# Patient Record
Sex: Male | Born: 1935
Health system: Southern US, Community
[De-identification: ages and names within clinical notes are randomized; demographics above are authoritative.]

## PROBLEM LIST (undated history)

## (undated) ENCOUNTER — Ambulatory Visit: Admission: RE | Payer: Medicare Other | Source: Ambulatory Visit | Admitting: Ophthalmology

## (undated) DIAGNOSIS — G250 Essential tremor: Secondary | ICD-10-CM

## (undated) DIAGNOSIS — F0280 Dementia in other diseases classified elsewhere without behavioral disturbance: Secondary | ICD-10-CM

## (undated) DIAGNOSIS — Z96659 Presence of unspecified artificial knee joint: Secondary | ICD-10-CM

## (undated) DIAGNOSIS — R350 Frequency of micturition: Secondary | ICD-10-CM

## (undated) DIAGNOSIS — C801 Malignant (primary) neoplasm, unspecified: Secondary | ICD-10-CM

## (undated) DIAGNOSIS — I739 Peripheral vascular disease, unspecified: Secondary | ICD-10-CM

## (undated) DIAGNOSIS — K219 Gastro-esophageal reflux disease without esophagitis: Secondary | ICD-10-CM

## (undated) DIAGNOSIS — E785 Hyperlipidemia, unspecified: Secondary | ICD-10-CM

## (undated) DIAGNOSIS — I441 Atrioventricular block, second degree: Secondary | ICD-10-CM

## (undated) DIAGNOSIS — F0150 Vascular dementia without behavioral disturbance: Secondary | ICD-10-CM

## (undated) DIAGNOSIS — N400 Enlarged prostate without lower urinary tract symptoms: Secondary | ICD-10-CM

## (undated) DIAGNOSIS — N529 Male erectile dysfunction, unspecified: Secondary | ICD-10-CM

## (undated) DIAGNOSIS — Z8551 Personal history of malignant neoplasm of bladder: Secondary | ICD-10-CM

## (undated) DIAGNOSIS — G252 Other specified forms of tremor: Principal | ICD-10-CM

## (undated) DIAGNOSIS — M810 Age-related osteoporosis without current pathological fracture: Secondary | ICD-10-CM

## (undated) DIAGNOSIS — I2510 Atherosclerotic heart disease of native coronary artery without angina pectoris: Secondary | ICD-10-CM

## (undated) DIAGNOSIS — I4510 Unspecified right bundle-branch block: Secondary | ICD-10-CM

## (undated) DIAGNOSIS — I10 Essential (primary) hypertension: Secondary | ICD-10-CM

## (undated) DIAGNOSIS — I350 Nonrheumatic aortic (valve) stenosis: Secondary | ICD-10-CM

## (undated) DIAGNOSIS — Z951 Presence of aortocoronary bypass graft: Secondary | ICD-10-CM

## (undated) DIAGNOSIS — I5042 Chronic combined systolic (congestive) and diastolic (congestive) heart failure: Secondary | ICD-10-CM

## (undated) DIAGNOSIS — Z955 Presence of coronary angioplasty implant and graft: Secondary | ICD-10-CM

## (undated) DIAGNOSIS — R32 Unspecified urinary incontinence: Secondary | ICD-10-CM

## (undated) DIAGNOSIS — M545 Low back pain, unspecified: Secondary | ICD-10-CM

## (undated) DIAGNOSIS — I8290 Acute embolism and thrombosis of unspecified vein: Secondary | ICD-10-CM

## (undated) DIAGNOSIS — G3184 Mild cognitive impairment, so stated: Secondary | ICD-10-CM

## (undated) DIAGNOSIS — H9190 Unspecified hearing loss, unspecified ear: Secondary | ICD-10-CM

## (undated) DIAGNOSIS — N429 Disorder of prostate, unspecified: Secondary | ICD-10-CM

## (undated) DIAGNOSIS — I517 Cardiomegaly: Secondary | ICD-10-CM

## (undated) DIAGNOSIS — H269 Unspecified cataract: Secondary | ICD-10-CM

## (undated) HISTORY — DX: Essential tremor: G25.0

## (undated) HISTORY — DX: Personal history of malignant neoplasm of bladder: Z85.51

## (undated) HISTORY — DX: Age-related osteoporosis without current pathological fracture: M81.0

## (undated) HISTORY — DX: Atrioventricular block, second degree: I44.1

## (undated) HISTORY — DX: Benign prostatic hyperplasia without lower urinary tract symptoms: N40.0

## (undated) HISTORY — DX: Male erectile dysfunction, unspecified: N52.9

## (undated) HISTORY — DX: Peripheral vascular disease, unspecified: I73.9

## (undated) HISTORY — DX: Nonrheumatic aortic (valve) stenosis: I35.0

## (undated) HISTORY — DX: Other specified forms of tremor: G25.2

## (undated) HISTORY — DX: Essential (primary) hypertension: I10

## (undated) HISTORY — DX: Vascular dementia without behavioral disturbance: F01.50

## (undated) HISTORY — DX: Hyperlipidemia, unspecified: E78.5

## (undated) HISTORY — DX: Presence of unspecified artificial knee joint: Z96.659

## (undated) HISTORY — DX: Atherosclerotic heart disease of native coronary artery without angina pectoris: I25.10

## (undated) HISTORY — DX: Gastro-esophageal reflux disease without esophagitis: K21.9

## (undated) HISTORY — DX: Unspecified right bundle-branch block: I45.10

## (undated) HISTORY — DX: Presence of aortocoronary bypass graft: Z95.1

## (undated) HISTORY — DX: Cardiomegaly: I51.7

## (undated) HISTORY — DX: Unspecified hearing loss, unspecified ear: H91.90

## (undated) HISTORY — DX: Presence of coronary angioplasty implant and graft: Z95.5

## (undated) HISTORY — DX: Acute embolism and thrombosis of unspecified vein: I82.90

## (undated) HISTORY — DX: Low back pain, unspecified: M54.50

## (undated) HISTORY — DX: Unspecified urinary incontinence: R32

## (undated) HISTORY — DX: Disorder of prostate, unspecified: N42.9

## (undated) HISTORY — DX: Mild cognitive impairment of uncertain or unknown etiology: G31.84

## (undated) HISTORY — DX: Chronic combined systolic (congestive) and diastolic (congestive) heart failure: I50.42

## (undated) HISTORY — PX: CARDIAC VALVE REPLACEMENT: SHX585

## (undated) HISTORY — DX: Unspecified cataract: H26.9

## (undated) HISTORY — PX: COLONOSCOPY, DIAGNOSTIC (SCREENING): SHX174

## (undated) NOTE — Progress Notes (Signed)
Formatting of this note is different from the original.  Images from the original note were not included.  Patient Name: Rick [name]  Patient DOB: 1924-06-21  Patient MRN: [id number]  Date of Service: 06/15/2022  Provider Creating Note: Daune Perch, DO     Primary Care Provider: No primary care provider on file.   Referring Provider: No primary care provider on file.     Assessment & Plan   CKD stage IIIa, fairly stable renal function dating back to 2020, crt usually 1.3-1.5 mg/dL at baseline   Heart failure with reduced ejection fraction, LVEF 25-30% in February 2023, well compensated on current medications   CAD status post CABG in 2003, under care of cardiology,   Pulmonary embolism in February 2023, managed by heme, on Eliquis   Aortic stenosis status post TAVR 2021, under care of cardiology   BPH, with nocturia, on alpha blocker   Hyperlipidemia, on statin therapy, under care of cardiology   Dementia, progressive, on medical therapy   Monoclonal gammopathy (IgG lambda), under care of hematology, with plan to monitor/conservative management   Thrombocytopenia, chronic, worsening, under care of heme     Discussion:  [name]   has moderate renal disease in the setting of advanced age, possible cardiorenal syndrome, and hemodynamic impact from use of dual RAAS blockers.   Renal function has been fairly stable dating back to 2020 and no proteinuria was noted on urinalysis today.He appears euvolemic with good BP control, and no medication changes are warranted today.     We will plan for 44-month follow-up.  At this time, overall renal prognosis seems quite good.    Orders Placed This Encounter    CBC    Renal function panel    PTH, intact    Urine Protein / creatinine ratio    Magnesium     Return in about 6 months (around 12/15/2022).    Referring Doctor:  Patient is referred by: No primary care provider on file.     History of Present Illness  I saw Rick [name] in the office today for the first time in  consultation. Thank you for the opportunity to participate in his care.     Rick [name] is a 46 y.o. male who is referred for evaluation of abnormal kidney function. The pt's son, Hutch Rhett II, is also present and helps provide history.     Serum creatinine has been mostly in the 1.3-1.4 mg/dL range from November 2020 to January 2024.  Mild elevation from baseline was noted earlier this month on April 1 where serum creatinine was 1.7 mg/dL, which then improved back to baseline on April 17. Office UA today shows no protein.     Past medical history includes  CAD status post CABG in 2003 , heart failure with reduced ejection fraction with EF of 25-30% in Feb 2023, PE in February 2023, aortic stenosis status post TAVR in 2021, HLD,  dementia, BPH, monoclonal gammopathy (IgG lambda), thrombocytopenia.    Medications include Lasix 20 mg daily, Jardiance 10 mg daily Entresto 12-13mg  BID, and Spironolactone 12.5mg  daily.     He does not use NSAIDS.     There is ongoing nocturia, 3-4x nightyl. Home Bps are in the 115-140/50-60.     Office UA: Urinalysis performed in the office on 06/15/2022 shows sg 1.020, ph 6.0, 1+ glucose, no protein,blood, pyuria. Urine sediment bland.     The following portions of the patient's chart were reviewed in this  encounter and updated as appropriate, and all recent labs were also reviewed:   Tobacco  Allergies  Med Hx  Surg Hx  Fam Hx        Review of Systems    Unable to perform ROS: Dementia     Past Medical/Surgical History  Past Medical History:   Diagnosis Date    Chronic kidney disease     Congestive heart failure (HCC)     Coronary atherosclerosis of unspecified type of vessel, native or graft     Monoclonal gammopathy     Other and unspecified hyperlipidemia     Pulmonary embolism (HCC)      Past Surgical History:   Procedure Laterality Date    AORTIC VALVE SURGERY      2021    CORONARY ARTERY BYPASS GRAFT      2003    INGUINAL HERNIA REPAIR      KNEE ARTHROPLASTY  Bilateral      Family History  family history is not on file.    Social History  Social History     Tobacco Use    Smoking status: Never    Smokeless tobacco: Never   Vaping Use    Vaping Use: Never used   Substance Use Topics    Alcohol use: Not Currently    Drug use: Never     Allergies:  No Known Allergies    Current Medications:    Current Outpatient Medications:     donepezil (ARICEPT) 5 MG tablet, Take 5 mg by mouth every night, Disp: , Rfl:     Empagliflozin 10 MG tablet, Take 10 mg by mouth in the morning., Disp: , Rfl:     memantine (NAMENDA) 10 MG tablet, Take 20 mg by mouth 1 (one) time each day, Disp: , Rfl:     rosuvastatin (CRESTOR) 10 MG tablet, Take 10 mg by mouth 1 (one) time each day, Disp: , Rfl:     sacubitril-valsartan (Entresto) 24-26 MG per tablet, Take 0.5 tablets by mouth in the morning and 0.5 tablets in the evening., Disp: , Rfl:     spironolactone (ALDACTONE) 25 MG tablet, Take 12.5 mg by mouth 1 (one) time each day, Disp: , Rfl:     tamsulosin (FLOMAX) 0.4 MG 24 hr capsule, Take 0.4 mg by mouth 1 (one) time each day, Disp: , Rfl:     apixaban (Eliquis) 2.5 MG tablet, Take 2.5 mg by mouth in the morning and 2.5 mg in the evening., Disp: , Rfl:     Docusate Sodium (DSS) 100 MG capsule, Take 100 mg by mouth in the morning and 100 mg in the evening., Disp: , Rfl:     furosemide (LASIX) 20 MG tablet, Take 20 mg by mouth 1 (one) time each day, Disp: , Rfl:     Vitamin D, Cholecalciferol, 25 MCG (1000 UT) tablet, Take 2,000 Units by mouth 3 times weekly: Mon Wed, and Fri in the evening., Disp: , Rfl:      Physical Exam  BP (!) 107/46   Pulse (!) 47   Temp 97.3 F   Ht 5\' 8"  (1.727 m)   Wt 159 lb (72.1 kg)   SpO2 96%   BMI 24.18 kg/m   BP Readings from Last 4 Encounters:   06/15/22 (!) 107/46     Wt Readings from Last 4 Encounters:   06/15/22 159 lb (72.1 kg)     Body mass index is 24.18 kg/m.    Constitutional: He does  not appear ill. No distress.   HEENT:   Mouth/Throat: Oropharynx is  clear and moist.   Eyes: Conjunctivae and EOM are normal. Pupils are equal, round, and reactive to light.   Neck:   Supple    Cardiovascular:  Normal rate and regular rhythm.      Exam reveals no gallop and no friction rub.       No murmur heard.He exhibits no edema.   Pulmonary/Chest: Effort normal and breath sounds normal. No respiratory distress. He has no wheezes. He has no rales.   Abdominal: Soft. Bowel sounds are normal. He exhibits no distension. There is no abdominal tenderness.   Musculoskeletal:      Comments: Rom testing deferred, pt in wheelchair    Neurological: He is alert.   Pt with dementia, minimally conversive    Skin: Skin is dry.   Psychiatric: He has a normal mood and affect. Judgment normal.     Imaging:   Dry CT abd March 2023 with no hydronephrosis     Thank you again for this consult. Please do not hesitate to call my office or message with any questions.    Daune Perch, DO  06/15/2022     *This note was generated by the Epic EMR system/ Dragon speech recognition and may contain inherent errors or omissions not intended by the user. Grammatical errors, random word insertions, deletions, pronoun errors and incomplete sentences are occasional consequences of this technology due to software limitations. Not all errors are caught or corrected. If there are questions or concerns about the content of this note or information contained within the body of this dictation they should be addressed directly with the author for clarification.*   Electronically signed by Daune Perch, DO at 06/17/2022 12:11 PM EDT

## (undated) NOTE — Progress Notes (Signed)
Formatting of this note is different from the original.  Images from the original note were not included.  Patient Name: Rick [name]  Patient DOB: 09/18/1924  Patient MRN: [id number]  Date of Service: 06/15/2022  Provider Creating Note: Mitesh Shah V, DO     Primary Care Provider: No primary care provider on file.   Referring Provider: No primary care provider on file.     Assessment & Plan   CKD stage IIIa, fairly stable renal function dating back to 2020, crt usually 1.3-1.5 mg/dL at baseline   Heart failure with reduced ejection fraction, LVEF 25-30% in February 2023, well compensated on current medications   CAD status post CABG in 2003, under care of cardiology,   Pulmonary embolism in February 2023, managed by heme, on Eliquis   Aortic stenosis status post TAVR 2021, under care of cardiology   BPH, with nocturia, on alpha blocker   Hyperlipidemia, on statin therapy, under care of cardiology   Dementia, progressive, on medical therapy   Monoclonal gammopathy (IgG lambda), under care of hematology, with plan to monitor/conservative management   Thrombocytopenia, chronic, worsening, under care of heme     Discussion:  Rick [name]   has moderate renal disease in the setting of advanced age, possible cardiorenal syndrome, and hemodynamic impact from use of dual RAAS blockers.   Renal function has been fairly stable dating back to 2020 and no proteinuria was noted on urinalysis today.He appears euvolemic with good BP control, and no medication changes are warranted today.     We will plan for 6-month follow-up.  At this time, overall renal prognosis seems quite good.    Orders Placed This Encounter    CBC    Renal function panel    PTH, intact    Urine Protein / creatinine ratio    Magnesium     Return in about 6 months (around 12/15/2022).    Referring Doctor:  Patient is referred by: No primary care provider on file.     History of Present Illness  I saw Rick [name] in the office today for the first time in  consultation. Thank you for the opportunity to participate in his care.     Rick [name] is a 89 y.o. male who is referred for evaluation of abnormal kidney function. The pt's son, Rick [name], is also present and helps provide history.     Serum creatinine has been mostly in the 1.3-1.4 mg/dL range from November 2020 to January 2024.  Mild elevation from baseline was noted earlier this month on April 1 where serum creatinine was 1.7 mg/dL, which then improved back to baseline on April 17. Office UA today shows no protein.     Past medical history includes  CAD status post CABG in 2003 , heart failure with reduced ejection fraction with EF of 25-30% in Feb 2023, PE in February 2023, aortic stenosis status post TAVR in 2021, HLD,  dementia, BPH, monoclonal gammopathy (IgG lambda), thrombocytopenia.    Medications include Lasix 20 mg daily, Jardiance 10 mg daily Entresto 12-13mg BID, and Spironolactone 12.5mg daily.     He does not use NSAIDS.     There is ongoing nocturia, 3-4x nightyl. Home Bps are in the 115-140/50-60.     Office UA: Urinalysis performed in the office on 06/15/2022 shows sg 1.020, ph 6.0, 1+ glucose, no protein,blood, pyuria. Urine sediment bland.     The following portions of the patient's chart were reviewed in this   encounter and updated as appropriate, and all recent labs were also reviewed:   Tobacco  Allergies  Med Hx  Surg Hx  Fam Hx        Review of Systems    Unable to perform ROS: Dementia     Past Medical/Surgical History  Past Medical History:   Diagnosis Date    Chronic kidney disease     Congestive heart failure (HCC)     Coronary atherosclerosis of unspecified type of vessel, native or graft     Monoclonal gammopathy     Other and unspecified hyperlipidemia     Pulmonary embolism (HCC)      Past Surgical History:   Procedure Laterality Date    AORTIC VALVE SURGERY      2021    CORONARY ARTERY BYPASS GRAFT      2003    INGUINAL HERNIA REPAIR      KNEE ARTHROPLASTY  Bilateral      Family History  family history is not on file.    Social History  Social History     Tobacco Use    Smoking status: Never    Smokeless tobacco: Never   Vaping Use    Vaping Use: Never used   Substance Use Topics    Alcohol use: Not Currently    Drug use: Never     Allergies:  No Known Allergies    Current Medications:    Current Outpatient Medications:     donepezil (ARICEPT) 5 MG tablet, Take 5 mg by mouth every night, Disp: , Rfl:     Empagliflozin 10 MG tablet, Take 10 mg by mouth in the morning., Disp: , Rfl:     memantine (NAMENDA) 10 MG tablet, Take 20 mg by mouth 1 (one) time each day, Disp: , Rfl:     rosuvastatin (CRESTOR) 10 MG tablet, Take 10 mg by mouth 1 (one) time each day, Disp: , Rfl:     sacubitril-valsartan (Entresto) 24-26 MG per tablet, Take 0.5 tablets by mouth in the morning and 0.5 tablets in the evening., Disp: , Rfl:     spironolactone (ALDACTONE) 25 MG tablet, Take 12.5 mg by mouth 1 (one) time each day, Disp: , Rfl:     tamsulosin (FLOMAX) 0.4 MG 24 hr capsule, Take 0.4 mg by mouth 1 (one) time each day, Disp: , Rfl:     apixaban (Eliquis) 2.5 MG tablet, Take 2.5 mg by mouth in the morning and 2.5 mg in the evening., Disp: , Rfl:     Docusate Sodium (DSS) 100 MG capsule, Take 100 mg by mouth in the morning and 100 mg in the evening., Disp: , Rfl:     furosemide (LASIX) 20 MG tablet, Take 20 mg by mouth 1 (one) time each day, Disp: , Rfl:     Vitamin D, Cholecalciferol, 25 MCG (1000 UT) tablet, Take 2,000 Units by mouth 3 times weekly: Mon Wed, and Fri in the evening., Disp: , Rfl:      Physical Exam  BP (!) 107/46   Pulse (!) 47   Temp 97.3 F   Ht 5' 8" (1.727 m)   Wt 159 lb (72.1 kg)   SpO2 96%   BMI 24.18 kg/m   BP Readings from Last 4 Encounters:   06/15/22 (!) 107/46     Wt Readings from Last 4 Encounters:   06/15/22 159 lb (72.1 kg)     Body mass index is 24.18 kg/m.    Constitutional: He does   not appear ill. No distress.   HEENT:   Mouth/Throat: Oropharynx is  clear and moist.   Eyes: Conjunctivae and EOM are normal. Pupils are equal, round, and reactive to light.   Neck:   Supple    Cardiovascular:  Normal rate and regular rhythm.      Exam reveals no gallop and no friction rub.       No murmur heard.He exhibits no edema.   Pulmonary/Chest: Effort normal and breath sounds normal. No respiratory distress. He has no wheezes. He has no rales.   Abdominal: Soft. Bowel sounds are normal. He exhibits no distension. There is no abdominal tenderness.   Musculoskeletal:      Comments: Rom testing deferred, pt in wheelchair    Neurological: He is alert.   Pt with dementia, minimally conversive    Skin: Skin is dry.   Psychiatric: He has a normal mood and affect. Judgment normal.     Imaging:   Dry CT abd March 2023 with no hydronephrosis     Thank you again for this consult. Please do not hesitate to call my office or message with any questions.    Mitesh Shah V, DO  06/15/2022     *This note was generated by the Epic EMR system/ Dragon speech recognition and may contain inherent errors or omissions not intended by the user. Grammatical errors, random word insertions, deletions, pronoun errors and incomplete sentences are occasional consequences of this technology due to software limitations. Not all errors are caught or corrected. If there are questions or concerns about the content of this note or information contained within the body of this dictation they should be addressed directly with the author for clarification.*   Electronically signed by Shah, Mitesh V, DO at 06/17/2022 12:11 PM EDT

---

## 1986-02-16 HISTORY — PX: CORONARY ANGIOPLASTY: SHX604

## 1996-02-17 HISTORY — PX: CORONARY ANGIOPLASTY WITH STENT PLACEMENT: SHX49

## 1997-02-16 DIAGNOSIS — Z951 Presence of aortocoronary bypass graft: Secondary | ICD-10-CM

## 1997-02-16 HISTORY — DX: Presence of aortocoronary bypass graft: Z95.1

## 1997-06-18 ENCOUNTER — Ambulatory Visit (HOSPITAL_COMMUNITY): Admission: RE | Admit: 1997-06-18 | Discharge: 1997-06-18 | Payer: Self-pay | Admitting: Cardiology

## 1998-10-04 ENCOUNTER — Ambulatory Visit (HOSPITAL_COMMUNITY): Admission: RE | Admit: 1998-10-04 | Discharge: 1998-10-04 | Payer: Self-pay | Admitting: *Deleted

## 1999-10-14 ENCOUNTER — Ambulatory Visit (HOSPITAL_COMMUNITY): Admission: RE | Admit: 1999-10-14 | Discharge: 1999-10-14 | Payer: Self-pay | Admitting: *Deleted

## 2000-04-09 ENCOUNTER — Encounter: Payer: Self-pay | Admitting: Urology

## 2000-04-09 ENCOUNTER — Encounter: Admission: RE | Admit: 2000-04-09 | Discharge: 2000-04-09 | Payer: Self-pay | Admitting: Urology

## 2000-04-20 ENCOUNTER — Encounter: Payer: Self-pay | Admitting: Urology

## 2000-04-22 ENCOUNTER — Ambulatory Visit (HOSPITAL_COMMUNITY): Admission: RE | Admit: 2000-04-22 | Discharge: 2000-04-22 | Payer: Self-pay | Admitting: Urology

## 2000-12-13 ENCOUNTER — Encounter (INDEPENDENT_AMBULATORY_CARE_PROVIDER_SITE_OTHER): Payer: Self-pay

## 2000-12-13 ENCOUNTER — Ambulatory Visit (HOSPITAL_COMMUNITY): Admission: RE | Admit: 2000-12-13 | Discharge: 2000-12-13 | Payer: Self-pay | Admitting: Urology

## 2001-02-16 HISTORY — PX: CORONARY ARTERY BYPASS GRAFT: SHX141

## 2003-02-17 HISTORY — PX: TOTAL KNEE ARTHROPLASTY: SHX125

## 2004-02-17 HISTORY — PX: KNEE SURGERY: SHX244

## 2004-04-08 ENCOUNTER — Inpatient Hospital Stay (HOSPITAL_COMMUNITY): Admission: RE | Admit: 2004-04-08 | Discharge: 2004-04-12 | Payer: Self-pay | Admitting: Orthopedic Surgery

## 2005-07-31 ENCOUNTER — Ambulatory Visit (HOSPITAL_COMMUNITY): Admission: RE | Admit: 2005-07-31 | Discharge: 2005-07-31 | Payer: Self-pay | Admitting: Pulmonary Disease

## 2007-04-20 HISTORY — PX: NM MYOCAR PERF WALL MOTION: HXRAD629

## 2007-04-25 ENCOUNTER — Encounter: Admission: RE | Admit: 2007-04-25 | Discharge: 2007-04-25 | Payer: Self-pay | Admitting: Cardiology

## 2007-04-27 ENCOUNTER — Ambulatory Visit (HOSPITAL_COMMUNITY): Admission: RE | Admit: 2007-04-27 | Discharge: 2007-04-27 | Payer: Self-pay | Admitting: Cardiology

## 2007-04-27 HISTORY — PX: CARDIAC CATHETERIZATION: SHX172

## 2007-04-29 ENCOUNTER — Ambulatory Visit: Payer: Self-pay | Admitting: Cardiothoracic Surgery

## 2007-05-09 ENCOUNTER — Encounter: Payer: Self-pay | Admitting: Cardiothoracic Surgery

## 2007-05-09 ENCOUNTER — Ambulatory Visit (HOSPITAL_COMMUNITY): Admission: RE | Admit: 2007-05-09 | Discharge: 2007-05-09 | Payer: Self-pay | Admitting: Cardiothoracic Surgery

## 2007-05-10 ENCOUNTER — Ambulatory Visit: Payer: Self-pay | Admitting: Internal Medicine

## 2007-05-10 ENCOUNTER — Inpatient Hospital Stay (HOSPITAL_COMMUNITY): Admission: RE | Admit: 2007-05-10 | Discharge: 2007-05-18 | Payer: Self-pay | Admitting: Cardiothoracic Surgery

## 2007-05-10 HISTORY — PX: CORONARY ARTERY BYPASS GRAFT: SHX141

## 2007-05-11 ENCOUNTER — Ambulatory Visit: Payer: Self-pay | Admitting: Cardiothoracic Surgery

## 2007-05-16 ENCOUNTER — Encounter: Payer: Self-pay | Admitting: Cardiothoracic Surgery

## 2007-06-02 ENCOUNTER — Encounter: Admission: RE | Admit: 2007-06-02 | Discharge: 2007-06-02 | Payer: Self-pay | Admitting: Cardiothoracic Surgery

## 2007-06-02 ENCOUNTER — Ambulatory Visit: Payer: Self-pay | Admitting: Cardiothoracic Surgery

## 2007-06-09 ENCOUNTER — Encounter (HOSPITAL_COMMUNITY): Admission: RE | Admit: 2007-06-09 | Discharge: 2007-08-17 | Payer: Self-pay | Admitting: Cardiology

## 2007-08-18 ENCOUNTER — Encounter (HOSPITAL_COMMUNITY): Admission: RE | Admit: 2007-08-18 | Discharge: 2007-11-10 | Payer: Self-pay | Admitting: Cardiology

## 2007-12-09 ENCOUNTER — Ambulatory Visit (HOSPITAL_COMMUNITY): Admission: RE | Admit: 2007-12-09 | Discharge: 2007-12-09 | Payer: Self-pay | Admitting: Pulmonary Disease

## 2008-07-05 ENCOUNTER — Emergency Department (HOSPITAL_COMMUNITY): Admission: EM | Admit: 2008-07-05 | Discharge: 2008-07-05 | Payer: Self-pay | Admitting: Emergency Medicine

## 2010-07-01 NOTE — Assessment & Plan Note (Signed)
OFFICE VISIT   [name], [name]  DOB:  04-Apr-1924                                        June 02, 2007  CHART #:  [id number]   [name] returns to the office today in followup visit after coronary  artery bypass grafting x4 done on May 10, 2007.  He has made excellent  progress postoperatively, especially considering his age of 42 years.  He is almost back to his preoperative level of activity.  Plans on  starting in cardiac rehab next week.  He has had no angina.  His postop  discharge was delayed because of some episodes of nonsustained VT.  He  ultimately was seen by EP but discharged home.  Since discharge, he has  had no syncope, near-syncope.  Has not noted any palpitations.   PHYSICAL EXAMINATION:  VITAL SIGNS:  His blood pressure is 140/76, pulse  is 70, respiratory rate is 18, O2 sats 95%.  CHEST:  His sternum is stable and well healed.  LUNGS:  Clear bilaterally.  EXTREMITIES:  His right endovein harvest site is healing well.  He has  no pedal edema.   Followup chest x-ray shows clear lung fields bilaterally.   Overall, he is making very good progress postoperatively.  He does note  some mild hoarseness.  I mentioned that if this did not improve over the  next several weeks we could have ENT evaluate him overall.   He is to see Dr. Elsie Lincoln tomorrow.  Currently he continues on Lipitor 20  mg a day, aspirin 81 mg a day, vitamin E, L-arginine, Ultram for pain,  Lopressor 50 b.i.d.  I will plan to see him back p.r.n.   Sheliah Plane, MD  Electronically Signed   EG/MEDQ  D:  06/02/2007  T:  06/02/2007  Job:  [id number]

## 2010-07-01 NOTE — Discharge Summary (Signed)
[name], [name] NO.:  [id number]   MEDICAL RECORD NO.:  [id number]          PATIENT TYPE:  OUT   LOCATION:  VASC                         FACILITY:  [facility]   PHYSICIAN:  Sheliah Plane, MD    DATE OF BIRTH:  04/18/1924   DATE OF ADMISSION:  05/09/2007  DATE OF DISCHARGE:  05/09/2007                               DISCHARGE SUMMARY   FINAL DIAGNOSIS:  Coronary artery occlusive disease.   IN-HOSPITAL DIAGNOSES:  1. Acute blood loss anemia postoperatively.  2. Acute renal insufficiency postoperatively.  3. Volume overload postoperatively.   SECONDARY DIAGNOSES:  1. History of coronary artery disease, status post angioplasty in 1998      and a stent in 1998.  2. Hypertension.  3. Hyperlipidemia.  4. Status post right knee surgery in 2006.  5. History of bladder cancer resected  about 3 years ago by Dr. Brunilda Payor.   IN-HOSPITAL OPERATIONS AND PROCEDURES:  Coronary artery bypass grafting  x4 using a left internal mammary artery to left anterior descending  coronary artery, sequential reverse saphenous vein graft to first and  second obtuse marginal, reverse saphenous vein graft to distal right  coronary artery with right endovein harvesting.   THE PATIENT'S HISTORY AND PHYSICAL AND HOSPITAL COURSE:  The patient is  an 77 year old male who has a known coronary occlusive disease having  had angioplasty and stenting of the proximal LAD approximately 10 years  prior.  He has sone well and has maintained good health habits.  The  patient continues to be exercising daily.  Over the past several weeks,  he had noted increasing episodes of substernal chest pain with exertion  and was seen by Dr. Elsie Lincoln.  Chest x-ray was done, showed to be  positive.  This led to a cardiac catheterization.  This showed complex  proximal LAD lesion prior to the stent and distal to the stent.  In  addition, he has had progression of disease in the proximal circumflex  involving the first  and second obtuse marginal branches.  The right  coronary artery was occluded with collateral filling.  Due to this,  coronary artery bypass grafting was recommended.  The patient was seen  and evaluated by Dr. Tyrone Sage.  Dr. Tyrone Sage discussed with the patient  to undergo a coronary artery bypass grafting.  He discussed the risks  and benefits with the patient.  The patient acknowledges understanding  and agreed to proceed.  Surgery was scheduled for May 10, 2007.  For  details of the patient's past medical history and physical exam, please  see dictated H&P.   On May 10, 2007, the patient underwent coronary artery bypass grafting  x4 using a left internal mammary artery to left anterior descending  coronary artery, sequential reverse saphenous vein graft to first and  second obtuse marginal, reverse saphenous vein graft to distal right  coronary artery with right endovein harvesting done.  The patient  tolerated this procedure well and transferred to the intensive care unit  in stable condition.  Postoperatively, the patient was noted to be  hemodynamically stable.  He was extubated evening of  surgery.  Post  extubation, the patient was noted to be alert and oriented x4, neuro  intact.  Post extubation, the patient was placed on nasal cannula 2  liters.  He was satting greater than 90% on room air.  Followup chest x-  ray done postop day 1 was stable.  He had minimal drainage from chest  tubes, and chest tubes discontinued in a normal fashion.  Repeat chest x-  ray remained stable with no pneumothorax.  The patient was working on  his incentive spirometer.  He was able to be weaned off oxygen satting  greater than 90% on room air.  The patient postoperatively was noted be  in normal sinus rhythm.  He was able to be weaned from all drips and  discontinued.  The patient was started on low-dose beta-blocker.  Heart  rate and blood pressure tolerated this well.  Possibly, planned to  start  the patient on low-dose ACE inhibitor, pending the patient's creatinine  remaining stable prior to discharge home.  The patient remained in  normal sinus rhythm during the remainder of his postoperative course.  Postoperatively, the patient did have mild acute blood loss anemia.  He  was asymptomatic.  He did not require any transfusions for this anemia.  Hemoglobin and hematocrit were followed and remained stable.  Postoperatively, the patient did have volume overload.  He was started  on diuretics.  Daily weights were obtained.  The patient was back to  near baseline  weight prior to discharge home.  He had mild peripheral  edema noted.  As stated above, the patient's creatinine did increase  slightly, went up to 1.68 postop day 2.  His diuretics were continued.  The ACE inhibitor was not started yet.  Creatinine was followed and by  postop day 3, it was down to 1.30.  We will plan to repeat prior to  discharge home.  Postoperatively, the patient is out of bed.  He is  ambulating well without difficulty.  He is tolerating diet well.  No  nausea or vomiting noted.  The patient remained afebrile.  Incisions  were clean, dry, intact, and healing well.  He was able to be  transferred out  of the intensive care unit to 2000 postop day 2.   On postop day 3, the patient's vitals were stable.  Labs showed white  count 8.5, hemoglobin 10.0, hematocrit 29.2, and platelet count of 72.  Noted the platelet count had been decreasing over several postop days.  Aspirin was decreased from 325 to 81 mg, and a hep panel was ordered.  We will follow up on the hep panel.  BMP done postop day 3 showed a  sodium of 139, potassium 4.5, chloride of 105, bicarbonate 30, BUN 24,  creatinine 1.3, and glucose of 120.  We will repeat BMP prior to  discharge home for evaluation of creatinine.   The patient seems to be ready for discharge home in the next 48 hours  pending he remains stable.   FOLLOWUP  APPOINTMENT:  Followup appointment has been arranged with Dr.  Tyrone Sage for June 02, 2007, at 11:30 a.m.  The patient will need to  obtain PA and lateral chest x-ray 30 minuets prior to this appointment.  He will need to follow up with Dr. Elsie Lincoln in 2 weeks.  He will need to  contact Dr. Truett Perna office to make these arrangements.   ACTIVITY:  The patient is instructed no driving until he is  told to  do  so, no heavy lifting over 10 pounds.  He is to start ambulate 3 to 4  times per day, progress as tolerated, and to continue doing the  exercises.   INCISIONAL CARE:  The patient is to shower, wash his incisions using  soap and water.  He is to contact the office if he develops any drainage  or opening from  any of his  incision sites.   DIET:  The patient was educated on diet to be low fat, low salt.   DISCHARGE MEDICATIONS:  1. Aspirin 81 mg daily.  2. Lipitor 20 mg at night.  3. Vitamin E as taken at home.  4. Toprol-XL 25 mg daily.  5. Lasix 40 mg daily x5 days.  6. Potassium chloride 20 mEq daily x5 days.  7. Ultram 50 mg every 8 hours p.r.n.      Theda Belfast, [place]      Sheliah Plane, MD  Electronically Signed    KMD/MEDQ  D:  05/13/2007  T:  05/14/2007  Job:  [id number]   cc:   Madaline Savage, M.D.

## 2010-07-01 NOTE — Consult Note (Signed)
NAMECARNEY, SAXTON NO.:  [id number]   MEDICAL RECORD NO.:  [id number]          PATIENT TYPE:  OIB   LOCATION:  2899                         FACILITY:  [facility]   PHYSICIAN:  Sheliah Plane, MD    DATE OF BIRTH:  Dec 30, 1924   DATE OF CONSULTATION:  04/29/2007  DATE OF DISCHARGE:  04/27/2007                                 CONSULTATION   REQUESTING PHYSICIAN:  Madaline Savage, M.D.   Elenor Quinones CARDIOLOGIST:  Madaline Savage, M.D.   PRIMARY CARE PHYSICIAN:  Mina Marble, M.D.   UROLOGIST:  Lindaann Slough, M.D.   REASON FOR CONSULTATION:  Coronary artery disease.   HISTORY OF PRESENT ILLNESS:  The patient is an active healthy 77-year-  old male who engages in regular daily exercise.  He has known coronary  occlusive disease,  having had an angioplasty in 1998 and a stent in  1998.  Both of these were in the LAD.  A stress test in March 2007  showed no evidence of ischemia.  He notes that over the past 6 to 8  months he has had increasing episodes of substernal tightness without  shortness of breath with exertion.  The discomfort is relieved with  rest.  He notes that it has not occurred at rest but with moderate  exercise, walking around his neighborhood or doing exercises with a TV  exercise program.  He has had known no known definite myocardial  infarction in the past.  He does have a history of hypertension,  hyperlipidemia.  Denies diabetes.  Has never smoked.  Has had no  claudication.  No history of previous renal insufficiency.   PAST SURGICAL HISTORY:  Includes right knee surgery in 2006, history of  bladder cancer resected about 3 years ago by Dr. Brunilda Payor.   SOCIAL HISTORY:  The patient is married, lives with his wife, though the  past 2 years his wife has been living in [place] for extended  periods, helping to take care of a daughter who has liver cancer.   FAMILY HISTORY:  Significant for his father dying probably of lung  cancer at  age 21.  Mother had Alzheimer's and died of pneumonia at 86.  He has several half-brothers and half-sisters, none with cardiac  disease.  The patient is retired from [employer].   MEDICATIONS:  Medications at this time include Lipitor 20 mg a day,  chelation once a day, aspirin 81 mg a day, vitamin E once a day, and L-  arginine once a day.   ALLERGIES:  He has no known allergies.  Cardiac drug allergies:  None  known.   REVIEW OF SYSTEMS:  Cardiac review of systems is positive for chest  pain.  Denies resting shortness of breath, exertional shortness of  breath, orthopnea, syncope, presyncope, palpitation, lower extremity  edema.   General review of systems:  Denies any constitutional symptoms.  Denies  TIAs or amaurosis.  He does have chest discomfort as noted above.  Denies wheezing or hemoptysis.  Denies any abdominal pain.  Denies  hematochezia.  Does have urinary frequency but no  blood in his urine.  He does have some joint discomfort but since his right knee surgery he  notes that his joint pain is much improved.  Denies any psychiatric  history.  Other review of systems are negative.   PHYSICAL EXAMINATION:  VITAL SIGNS:  Blood pressure 164/87, pulse 84,  respiratory rate 18, O2 sat is 95%.  He is 5 feet 8 inches tall, 184  pounds.  GENERAL:  The patient is alert, neurologically intact.  HEENT:  Pupils equal, round and reactive to light.  Neck is without  carotid bruits.  LUNGS:  Clear bilaterally.  CARDIAC:  Reveals regular rate and rhythm without murmur or gallop.  ABDOMEN:  Benign without palpable masses or tenderness.  Abdominal aorta  is not palpably enlarged.  EXTREMITIES:  He has 2+ femoral pulses bilaterally.  The right groin  site is stable.  He has no evidence of venous stasis disease, and the  lower extremity has +1 DP and PT pulses bilaterally.   Cardiac catheterization films are reviewed and show 3-vessel coronary  artery disease with eccentric 50% to  60% left main.  The LAD has a 90%  stenosis at LAD bifurcation with the septal perforator.  The  intermediate branch is large with 70% disease, and the second obtuse  marginal has an 80% obstruction.  The right coronary artery is totally  occluded.  LV function shows 50% ejection fraction without mitral  regurgitation.   IMPRESSION:  With the patient's overall excellent functional status in  spite of his age and significant 3-vessel coronary artery disease,  including a left main component, I agree with Dr. Elsie Lincoln that proceeding  with coronary artery bypass grafting is the best treatment for the  patient with the most gain from the least risk.  The pros and cons of  surgery were discussed with the patient in detail, including the risk of  death, infection, stroke, myocardial infarction, bleeding, blood  transfusion.  The patient is willing to proceed.  We will obtain  preoperative Doppler studies.  I will discuss with Dr. Truett Perna office  about starting  him on a low-dose beta-blocker prior to surgery.  The  patient is going to discuss with his wife, who is in [place], the  plans, and we have tentatively arranged for Tuesday, March 24.      Sheliah Plane, MD  Electronically Signed     EG/MEDQ  D:  04/29/2007  T:  04/30/2007  Job:  [id number]   cc:   Madaline Savage, M.D.  Mina Marble, M.D.

## 2010-07-01 NOTE — Consult Note (Signed)
[name], [name] NO.:  [id number]   MEDICAL RECORD NO.:  [id number]          PATIENT TYPE:  INP   LOCATION:  2017                         FACILITY:  [facility]   PHYSICIAN:  Duke Salvia, MD, FACCDATE OF BIRTH:  [birth year]   DATE OF CONSULTATION:  DATE OF DISCHARGE:  05/09/2007                                 CONSULTATION   HISTORY OF PRESENT ILLNESS:  Thank you very much for asking [place] to see  [name] in consultation because of nonsustained ventricular  tachycardia in the post CABG setting.   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  He underwent angioplasty but  20 years ago, stenting about 10 years ago and then began having  substernal chest discomfort with exertion over last couple of months.  He underwent stress testing and evidence of ischemia in the anterior  apical region was noted.  Ejection fraction was not quantitated because  of activity.  I cannot tell from the report, whether there is evidence  of resting inferior wall defect.  My interpretation is that it showed  ischemia in both distributions, but I do not know whether it is a  residual  perfusion defect or not.  Any case, he was then submitted for  catheterization demonstrating disease in his LAD, obtuse marginal and  totally occluded right.  Ejection fraction at that time was noted to be  50% without wall motion abnormalities.  An echo done a week later showed  ejection fraction of 35% with inferior wall motion abnormality.  He was  then submitted for CABG which was accomplished x4.  In the postoperative  situation, he has had nonsustained ventricular tachycardia.   He has a history of ventricular ectopy as evidenced in his Myoview scan.  He has a history of some palpitations.  He does not have any syncope.   PAST MEDICAL HISTORY:  In addition to  above, notable for treated  dyslipidemia.   PAST SURGICAL HISTORY:  Notable for an orthopedic procedure that I do  not recall.   REVIEW OF SYSTEMS:  Notable for some costochondral discomfort.  He is  otherwise broadly negative.   SOCIAL HISTORY:  He is married.  He has three children and one  grandchild.  He does not use cigarettes or alcohol.   CURRENT MEDICATIONS:  1. Lopressor up titrated 50 b.i.d.  2. Lasix daily.  3. Lipitor.  4. Aspirin.   ALLERGIES:  NO KNOWN DRUG ALLERGIES.   PHYSICAL EXAMINATION:  GENERAL:  On examination, he is an elderly  African American male appearing considerably older than his stated age  of 27.  VITAL SIGNS:  His blood pressure is 136/81.  His pulse was 78.  His  respirations were 18.  He was in no acute distress.  HEENT:  Demonstrated no icterus or xanthoma.  NECK:  Veins were flat.  His carotids were brisk and full bilaterally  without bruits.  BACK:  Without kyphosis, scoliosis.  LUNGS:  Clear.  HEART:  Sounds were regular without murmurs, gallops or rubs.  ABDOMEN:  Soft with active bowel sounds.  EXTREMITIES:  His femoral pulses were not examined.  Distal pulses were  intact.  There is no clubbing, cyanosis or edema.  NEUROLOGICAL:  Exam was grossly normal.  SKIN:  Warm and dry.   STUDIES:  Electrocardiogram dated March 24 demonstrated sinus rhythm at  66 with intervals of 0.17/0.14/0.46.  the axis was mildly leftward at -  6.  Telemetry strip showed nonsustained ventricular tachycardia at cycle  length of up to 300 milliseconds or so at greater than 16 beats which  was a cut off of the tracing.  He also had bigeminy with functional  rates in the 30s and other triplets and couplets as well.   IMPRESSION:  1. Nonsustained ventricular tachycardia.  2. Ischemic heart disease.      a.     Prior CABG.      b.     Preop ejection fraction discordant with:  3. Echo 35% with inferior wall motion abnormality.  4. Catheterization  demonstrating 50%.  5. Myoview not recorded ectopy.  6. Right bundle branch block.   DISCUSSION:  [name] has nonsustained ventricular tachycardia in the  setting of discord assessment of his ejection fraction.  Prognostic  implications in his setting are not entirely clear although the patient  is in the post CABG situation who had persistent depression of LV  systolic function or study as part of the MUSTT trial as well as in the  coronal sub study and found to have prognostic risk similar to those  patients who were studied more remotely from their infarction or  revascularization.  The other related observation has been that in the  CABG patch trial revascularization of chronically ischemic segments was  associated with more ventricular tachycardia and revascularization.  In  the event that it is quite normal, then further testing can certainly be  deferred (see below) and if his ejection fraction is in the 30-35% range  or lower risk stratification with EP testing at this point would be  reasonable with ICDs reserved for those patients in whom sustained  ventricular arrhythmias were inducible.   The only other issue that is begged by the emergence of the VT  postoperatively is whether there is an ischemic substrate giving rise to  an automatic VT.  It is not clear to me, however, that this VT is new  based on the comments from the Myoview regarding inability to interpret  because of complex and/or frequent ventricular ectopy.   Today then, I would recommend:  1. We reassess ejection fraction.  2. Consideration of EP testing and/or catheterization, depending on      the above results.  3. Is there quantified data regarding pre CABG ventricular ectopy.   Thank you for the consultation.      Duke Salvia, MD, [facility]  Electronically Signed     SCK/MEDQ  D:  05/16/2007  T:  05/16/2007  Job:  [id number]   cc:   Sheliah Plane, MD  Madaline Savage, M.D.  Mina Marble, M.D.

## 2010-07-01 NOTE — Discharge Summary (Signed)
NAMEJAVIER, [name] NO.:  [id number]   MEDICAL RECORD NO.:  [id number]           PATIENT TYPE:   LOCATION:                                 FACILITY:   PHYSICIAN:  Rowe Clack, P.A.-C. DATE OF BIRTH:  1924/12/08   DATE OF ADMISSION:  05/13/2007  DATE OF DISCHARGE:  05/18/2007                               DISCHARGE SUMMARY   ADDENDUM   [name] continued to make adequate progress in his recovery.  He did  however, develop some episodes of nonsustained ventricular tachycardia.  His beta-blocker was adjusted .  He was seen in EP consultation by Dr.  Graciela Husbands.  Reassessment of his ejection fraction by 2-D echocardiogram was  done.  His EF was 50%.  His rhythm stabilized to occasional couplets  with rare PVCs.  He was otherwise in normal sinus rhythm.  He continued  to progress nicely with his rehabilitation during the extended stay as  well.  He was deemed to be acceptable for discharge on May 18, 2007.   MEDICATIONS AT DISCHARGE:  Included.  1. Ultram 50 mg every 8 hours p.r.n.  2. Vitamin E p.r.n. as taken previously.  3. Lopressor 50 mg twice daily.   INSTRUCTIONS:  The patient received written instructions regarding  medications, activity, diet, wound care, and follow-up.  Follow-up was  to include Dr. Elsie Lincoln and Dr. Tyrone Sage.  Additionally, he will have  arrangements for a Holter monitoring as an outpatient.  For further  details of this hospitalization, please see the previously dictated  discharge summary.      Rowe Clack, P.A.-C.     Sherryll Burger  D:  08/09/2007  T:  08/10/2007  Job:  [id number]   cc:   Madaline Savage, M.D.  Duke Salvia, MD, [facility]  Sheliah Plane, MD

## 2010-07-01 NOTE — Op Note (Signed)
NAMEJORRELL, KUSTER NO.:  [id number]   MEDICAL RECORD NO.:  [id number]          PATIENT TYPE:  INP   LOCATION:  2311                         FACILITY:  [facility]   PHYSICIAN:  Sheliah Plane, MD    DATE OF BIRTH:  29-Jan-1925   DATE OF PROCEDURE:  05/10/2007  DATE OF DISCHARGE:                               OPERATIVE REPORT   PREOPERATIVE DIAGNOSIS:  Coronary occlusive disease.   POSTOPERATIVE DIAGNOSIS:  Coronary occlusive disease.   SURGICAL PROCEDURES:  Coronary artery bypass grafting x4 with the left  internal mammary to left anterior descending coronary artery, sequential  reverse saphenous vein graft to the first and second obtuse marginal,  reverse saphenous vein graft to the distal right coronary artery with  right EndoVein harvesting.   SURGEON:  Dr. Tyrone Sage.   FIRST ASSISTANT:  Dr. Laneta Simmers.   SECOND ASSISTANT:  Doree Fudge, P.A.-C.   BRIEF HISTORY:  The patient is an 77 year old male who has known  coronary occlusive disease having had angioplasty and stenting of the  proximal LAD approximately 10 years prior.  He has done well, maintained  good health habits.  Continues to be exercising daily.  Over the past  several weeks, he had noted increasing episodes of substernal chest pain  with exertion and was seen by Dr. Elsie Lincoln.  Stress test was positive,  leading to a cardiac catheterization.  This showed complex proximal LAD  lesion prior to the stent and distal to the stent.  In addition, he has  had progression of disease in the proximal circumflex and involving the  first and second obtuse marginal branches.  The right coronary artery  was occluded with collateral filling.  Because of the patient's positive  stress test symptoms and complex proximal LAD lesion, coronary artery  bypass grafting was recommended.  The patient agreed and signed informed  consent.   DESCRIPTION OF PROCEDURE:  With Swan-Ganz and arterial line monitors in  place,  the patient underwent general endotracheal anesthesia without  incidence.  The skin of the chest and legs was prepped with Betadine and  draped in usual sterile manner.  Using the [employer] EndoVein harvesting  system, vein was harvested from the right thigh and calf and was of good  quality and caliber.  Median sternotomy was performed.  Left internal  mammary artery was dissected down as a pedicle graft.  The distal artery  was divided and had good free flow.  Pericardium was opened.  Overall  ventricular function appeared preserved.  The patient was systemically  heparinized.  Ascending aorta was cannulated.  The right atrium was  cannulated.  Aortic root vent cardioplegia needle was introduced into  the ascending aorta.  The patient was placed on cardiopulmonary bypass  2.4 liters per minute per meter square.  Sites of anastomosis were  selected and dissected out of the epicardium.  The patient's body  temperature was cooled to 30 degrees.  Aortic crossclamp was applied.  Five hundred mL of cold blood potassium cardioplegia was administered  with rapid diastolic arrest of the heart.  Attention was turned first to  the distal  right coronary artery which was opened.  Was a somewhat small  vessel but did admit a 1.5-mm probe.  Using a running 7-0 Prolene,  distal anastomosis was performed.  Attention was then turned to the  lateral aspect of the heart.  The first obtuse marginal was partially  intramyocardial.  Vessel was identified, opened, and admitted a 1.5-mm  probe.  Using diamond type side-to-side anastomosis was carried out  distal, the same vein was then carried to the smaller second obtuse  marginal but did admit a 1.5-mm probe.  Using running 7-0 Prolene distal  anastomosis was performed.  Attention was then turned to the left  anterior descending coronary artery which was very heavily calcified and  diffusely diseased, between the mid and distal third, the vessel was  still of  reasonable size and distal to the significant areas of  calcification LAD was opened and was approximately 1.8 mm in size  distally.  Using a running 8-0 Prolene left internal mammary artery was  anastomosed to left anterior descending coronary artery.  With  crossclamp still in place, two punch aortotomies were performed.  Each  of the two vein grafts were anastomosed to the ascending aorta.  Air was  evacuated from grafts and the ascending aorta.  The bulldog on the  mammary artery was removed, and there were prompt rise in myocardial  septal temperature.  Aortic crossclamp was removed.  Total crossclamp  time of 74 minutes.  The patient spontaneously converted to a sinus  rhythm.  Sites of anastomosis were inspected and were free of bleeding.  He was then ventilated and weaned from cardiopulmonary bypass without  difficulty.  He remained hemodynamically stable.  He was decannulated in  usual fashion.  Protamine sulfate was administered.  With the operative  field hemostatic, two atrial and two ventricular pacing wires were  applied.  Graft markers applied.  Left pleural tube and a Blake  mediastinal drain were left in place.  Pericardium was reapproximated.  Sternum was closed with #6 stainless steel wire.  Fascia closed with  interrupted 0 Vicryl, running 3-0 Vicryl in subcutaneous tissue, 4-0  subcuticular stitch in skin edges.  Dry dressings were applied.  Sponge  and needle count was reported as correct at completion of procedure.  The patient tolerated procedure without obvious complication and was  transferred to surgical intensive care unit for further postoperative  care.      Sheliah Plane, MD  Electronically Signed     EG/MEDQ  D:  05/11/2007  T:  05/12/2007  Job:  [id number]   cc:   Madaline Savage, M.D.

## 2010-07-01 NOTE — Cardiovascular Report (Signed)
[name], [name] NO.:  [id number]   MEDICAL RECORD NO.:  [id number]          PATIENT TYPE:  OIB   LOCATION:  2899                         FACILITY:  [facility]   PHYSICIAN:  Madaline Savage, M.D.DATE OF BIRTH:  11-13-1924   DATE OF PROCEDURE:  DATE OF DISCHARGE:                            CARDIAC CATHETERIZATION   PROCEDURES PERFORMED:  1. Selective coronary angiography by Judkins technique.  2. Retrograde left heart catheterization.  3. Left ventricular angiography.  4. Selective visualization of the left subclavian and nonselective      visualization of the left internal mammary artery.  5. Abdominal aortography above the renals.   COMPLICATIONS:  None.   ENTRY SITE:  Right femoral.   DYE USED:  Omnipaque.   PATIENT PROFILE:  [name] is a remarkable 77 year old gentleman with  no symptoms at present, but a history of coronary artery disease dating  back to 1988 when he had left anterior descending coronary artery  angioplasty 1988 and then stent placement with an AVE stent in the  proximal LAD in 1998.  He had a stress test in our office in March 2007  which showed no evidence of ischemia.  The ejection fraction was not  reported from the cath, but an echocardiogram showed normal LV systolic  function with mild hypokinesis in the lateral wall.  He exercises 4-5  days a week for 30 minutes and uses [employer].  He has never smoked and  does not use alcohol.  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  He has elevated blood  pressure that has highs and lows depending on the time of the year.  Recent Myoview stress test showed abnormalities in perfusion consistent  with ischemia and today's procedure was performed electively without  complications.   RESULTS:  Pressures:  Left ventricular pressure was 160/6.  Central  aortic pressure was 160/75.  No aortic valve gradient by pullback  technique.   ANGIOGRAPHIC RESULTS:  The patient has diffuse mild coronary  calcifications.  There is a radiopaque stent in the proximal LAD  extending down to the LAD diagonal bifurcation.  It is patent.  It may  have about 30% in-stent restenosis.  It is now 75 years old.  The left  main itself appears patent  in the RAO view, but in a different view,  there is an ostial stenosis of somewhere between 50-60%.   The left anterior descending coronary artery contains a bifurcation  stenosis where the septal and the LAD bifurcate.  There is a 95% ostial  stenosis at the spot where that bifurcation occurs.  There is a 75%  stenosis in that large septal perforator branch.  That septal perforator  branch is probably a 2.5 -2.75 mm vessel.  The LAD courses to the  cardiac apex and does appear to be bypassable, and wraps around the  apex.   The diagonal branch is somewhat small and contains proximal disease, it  is potentially bypassable.   There is an intermediate ramus branch that is very large and is  bypassable.   Circumflex coronary artery itself shows luminal irregularities,  especially in the midportion of the vessel where it is 50% narrowed.  The second obtuse marginal branch contains a stenosis of 80% or greater  proximally and the distal vessel is bypassable.   The right coronary artery was 100% occluded just beyond the ostium.  There is a pulmonary conus branch that provides retrograde collateral  flow.  There is also lateral flow through a sinus node branch that  creates a loop of collateralized blood flow to a very diseased RCA.  There is potentially some bypass ability of this vessel depending on the  surgeon's point of view.   The left internal mammary artery is patent.  The left subclavian is  pristine in appearance.   Left ventricular angiography shows LV systolic function and low normal  at 50% with no mitral regurgitation and no LV thrombus.  Ascending aorta  is not remarkable.   Abdominal aorta shows absence of any calcifications.  There is a 90% ostial left renal artery stenosis.  The right renal  artery ostium is not well visualized, but the mid-distal right renal  artery appears normal.   FINAL IMPRESSION:  1. Recent positivity of a stress test that had been negative 2 years      ago.  2. Hypertension partly controlled and partly uncontrolled.  3. Known coronary disease with previous nondrug-eluting stent 11-12      years ago.  4. Three-vessel coronary disease as described above.  5. Left renal artery stenosis.   RECOMMENDATIONS:  The patient will be discharged later this evening as  an outpatient.  I will have him follow up with a cardiac surgeon to  consider coronary artery bypass grafting for preservation of LV systolic  function and revascularization of very diseased coronary arteries.           ______________________________  Madaline Savage, M.D.     WHG/MEDQ  D:  04/27/2007  T:  04/28/2007  Job:  [id number]   cc:   Mina Marble, M.D.  Lindaann Slough, M.D.  [facility]

## 2010-07-04 NOTE — H&P (Signed)
[name], [name] NO.:  [id number]   MEDICAL RECORD NO.:  [id number]          PATIENT TYPE:   LOCATION:                                 FACILITY:   PHYSICIAN:  Marlowe Kays, M.D.  DATE OF BIRTH:  03/16/1924   DATE OF ADMISSION:  04/08/2004  DATE OF DISCHARGE:                                HISTORY & PHYSICAL   CHIEF COMPLAINT:  Pain in my knees, more so on my right than left.   PRESENT ILLNESS:  This is a 77 year old gentleman who has been seen by [place]  for continuing and progressive problems concerning pains in his knees.  He  has had documented advanced osteoarthritis with bone-on-bone abutment.  He  has developed a varus deformity, as well.  He has patellofemoral arthritic  changes seen on x-ray.  He has been drying to delay the surgery; however, it  is to the point now where he has extreme problems with climbing and  descending stairs, getting up from a sitting position.  He now has developed  a lag in the extension of about 10 degrees bilaterally.  He is most  uncomfortable in his right knee now, and after much discussion, including  clearance for surgery by his cardiologist, Dr. Chanda Busing, it was  decided to go ahead with right total knee replacement arthroplasty.   PAST MEDICAL HISTORY:  This gentleman has been in relatively good health  throughout his lifetime.   ALLERGIES:  He has no medical allergies.   MEDICATION:  His only medication is Zocor.   PAST SURGICAL HISTORY:  He has had 2 angioplasties in the past, and during  the last one, a stent was placed in the coronary arteries.  He had a small  bladder tumor removed without sequelae; that was about a year to a year and  a half ago.   FAMILY HISTORY:  Positive for lung cancer in his father.  Arthritis in both  his mother and father.   SOCIAL HISTORY:  The patient is married.  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  He has no intake or alcohol or tobacco products.  He has 3 children.  His spouse, [name], will be his care-giver after  surgery.   REVIEW OF SYSTEMS:  CNS:  No seizures, shoulder paralysis, numbness, double  vision.  RESPIRATORY:  No productive cough, no hemoptysis, no shortness of  breath.  CARDIOVASCULAR:  No chest pain, no angina, no orthopnea.  GI:  No  nausea, vomiting, melena, or bloody stools.  GENITOURINARY:  No discharge,  dysuria, hematuria.  MUSCULOSKELETAL:  Primarily in present illness with his  knees.   PHYSICAL EXAMINATION:  GENERAL:  Alert, cooperative, friendly 77 year old  black male.  VITAL SIGNS:  Blood pressure 148/78, pulse 60, respirations 12.  HEENT:  Normocephalic.  Pupils equal, round and reactive to light and  accommodation.  Extraocular movements intact.  Oropharynx is clear.  CHEST:  Clear to auscultation.  No rhonchi, no rales, no wheezes.  HEART:  Regular rate and rhythm.  No murmurs are heard; however, the heart  sounds are  distant.  ABDOMEN:  Soft, nontender.  Liver and spleen not felt.  GENITALIA/RECTAL:  Not done.  Not pertinent to present illness.  EXTREMITIES:  Knee deformities, as in the present illness above.   ADMITTING DIAGNOSIS:  1.  Osteoarthritis, bilateral knees, more so on the right than the left.  2.  Hypertension.  3.  Coronary artery disease.  4.  Hyperlipidemia.  5.  Benign prostatic hypertrophy.  6.  History of bladder cancer.   PLAN:  The patient will undergo right total knee replacement arthroplasty.  Should we have any medical problems, we will certainly contact Dr. Corine Shelter, and should he have any cardiac events, we will contact Dr.  Chanda Busing.      DLU/MEDQ  D:  03/27/2004  T:  03/27/2004  Job:  [id number]   cc:   Eino Farber., M.D.  [address] [place]  [place] [address]  Fax: [phone]   Madaline Savage, M.D.  [phone] [address]  [place]  [place] [address]  Fax: [phone]

## 2010-10-01 HISTORY — PX: US ECHOCARDIOGRAPHY: HXRAD669

## 2010-11-10 LAB — CBC
HCT: 28.4 — ABNORMAL LOW
HCT: 28.6 — ABNORMAL LOW
HCT: 28.7 — ABNORMAL LOW
HCT: 29.2 — ABNORMAL LOW
HCT: 30 — ABNORMAL LOW
HCT: 30.1 — ABNORMAL LOW
HCT: 31 — ABNORMAL LOW
HCT: 31.2 — ABNORMAL LOW
HCT: 40.7
Hemoglobin: 10 — ABNORMAL LOW
Hemoglobin: 10 — ABNORMAL LOW
Hemoglobin: 10.3 — ABNORMAL LOW
Hemoglobin: 10.3 — ABNORMAL LOW
Hemoglobin: 10.8 — ABNORMAL LOW
Hemoglobin: 10.8 — ABNORMAL LOW
Hemoglobin: 13.8
Hemoglobin: 9.8 — ABNORMAL LOW
Hemoglobin: 9.8 — ABNORMAL LOW
MCHC: 34
MCHC: 34.1
MCHC: 34.1
MCHC: 34.3
MCHC: 34.4
MCHC: 34.4
MCHC: 34.6
MCHC: 34.8
MCHC: 34.9
MCV: 100
MCV: 100.3 — ABNORMAL HIGH
MCV: 98.7
MCV: 99
MCV: 99
MCV: 99.3
MCV: 99.4
MCV: 99.8
MCV: 99.9
Platelets: 101 — ABNORMAL LOW
Platelets: 102 — ABNORMAL LOW
Platelets: 171
Platelets: 183
Platelets: 72 — ABNORMAL LOW
Platelets: 77 — ABNORMAL LOW
Platelets: 80 — ABNORMAL LOW
Platelets: 85 — ABNORMAL LOW
Platelets: 91 — ABNORMAL LOW
RBC: 2.85 — ABNORMAL LOW
RBC: 2.88 — ABNORMAL LOW
RBC: 2.9 — ABNORMAL LOW
RBC: 2.92 — ABNORMAL LOW
RBC: 3 — ABNORMAL LOW
RBC: 3.01 — ABNORMAL LOW
RBC: 3.15 — ABNORMAL LOW
RBC: 3.15 — ABNORMAL LOW
RBC: 4.1 — ABNORMAL LOW
RDW: 12.7
RDW: 12.9
RDW: 12.9
RDW: 13
RDW: 13
RDW: 13.1
RDW: 13.1
RDW: 13.3
RDW: 13.4
WBC: 10.3
WBC: 10.9 — ABNORMAL HIGH
WBC: 11.2 — ABNORMAL HIGH
WBC: 4.9
WBC: 5.7
WBC: 6.4
WBC: 6.6
WBC: 8.5
WBC: 9.3

## 2010-11-10 LAB — POCT I-STAT 3, ART BLOOD GAS (G3+)
Acid-Base Excess: 1
Acid-Base Excess: 5 — ABNORMAL HIGH
Acid-base deficit: 3 — ABNORMAL HIGH
Bicarbonate: 21.7
Bicarbonate: 24
Bicarbonate: 25.4 — ABNORMAL HIGH
Bicarbonate: 27.4 — ABNORMAL HIGH
O2 Saturation: 100
O2 Saturation: 95
O2 Saturation: 99
O2 Saturation: 99
Operator id: 256891
Operator id: 257021
Operator id: 257021
Operator id: 3291
Patient temperature: 34.9
Patient temperature: 37.4
Patient temperature: 38.2
TCO2: 23
TCO2: 25
TCO2: 27
TCO2: 28
pCO2 arterial: 32.1 — ABNORMAL LOW
pCO2 arterial: 33.3 — ABNORMAL LOW
pCO2 arterial: 35.9
pCO2 arterial: 38.7
pH, Arterial: 7.391
pH, Arterial: 7.425
pH, Arterial: 7.474 — ABNORMAL HIGH
pH, Arterial: 7.527 — ABNORMAL HIGH
pO2, Arterial: 108 — ABNORMAL HIGH
pO2, Arterial: 144 — ABNORMAL HIGH
pO2, Arterial: 340 — ABNORMAL HIGH
pO2, Arterial: 71 — ABNORMAL LOW

## 2010-11-10 LAB — BLOOD GAS, ARTERIAL
Acid-Base Excess: 1.5
Bicarbonate: 25.6 — ABNORMAL HIGH
Drawn by: 274481
FIO2: 0.21
O2 Saturation: 95.3
Patient temperature: 98.6
TCO2: 26.8
pCO2 arterial: 40.5
pH, Arterial: 7.417
pO2, Arterial: 77.6 — ABNORMAL LOW

## 2010-11-10 LAB — POCT I-STAT 3, VENOUS BLOOD GAS (G3P V)
Acid-base deficit: 1
Bicarbonate: 23.8
O2 Saturation: 82
Operator id: 3291
TCO2: 25
pCO2, Ven: 40.7 — ABNORMAL LOW
pH, Ven: 7.374 — ABNORMAL HIGH
pO2, Ven: 47 — ABNORMAL HIGH

## 2010-11-10 LAB — POCT I-STAT 4, (NA,K, GLUC, HGB,HCT)
Glucose, Bld: 100 — ABNORMAL HIGH
Glucose, Bld: 100 — ABNORMAL HIGH
Glucose, Bld: 116 — ABNORMAL HIGH
Glucose, Bld: 124 — ABNORMAL HIGH
Glucose, Bld: 84
Glucose, Bld: 84
Glucose, Bld: 92
Glucose, Bld: 93
HCT: 22 — ABNORMAL LOW
HCT: 22 — ABNORMAL LOW
HCT: 23 — ABNORMAL LOW
HCT: 24 — ABNORMAL LOW
HCT: 24 — ABNORMAL LOW
HCT: 29 — ABNORMAL LOW
HCT: 32 — ABNORMAL LOW
HCT: 35 — ABNORMAL LOW
Hemoglobin: 10.9 — ABNORMAL LOW
Hemoglobin: 11.9 — ABNORMAL LOW
Hemoglobin: 7.5 — CL
Hemoglobin: 7.5 — CL
Hemoglobin: 7.8 — CL
Hemoglobin: 8.2 — ABNORMAL LOW
Hemoglobin: 8.2 — ABNORMAL LOW
Hemoglobin: 9.9 — ABNORMAL LOW
Operator id: 257021
Operator id: 3291
Operator id: 3291
Operator id: 3291
Operator id: 3291
Operator id: 3291
Operator id: 3291
Operator id: 3291
Potassium: 3.8
Potassium: 3.8
Potassium: 4
Potassium: 4.1
Potassium: 4.3
Potassium: 4.3
Potassium: 4.5
Potassium: 4.8
Sodium: 136
Sodium: 137
Sodium: 137
Sodium: 137
Sodium: 137
Sodium: 141
Sodium: 142
Sodium: 142

## 2010-11-10 LAB — PROTIME-INR
INR: 0.9
INR: 1
INR: 1.4
Prothrombin Time: 12.7
Prothrombin Time: 13.4
Prothrombin Time: 17.6 — ABNORMAL HIGH

## 2010-11-10 LAB — COMPREHENSIVE METABOLIC PANEL
ALT: 18
AST: 23
Albumin: 4
Alkaline Phosphatase: 55
BUN: 13
CO2: 23
Calcium: 9.4
Chloride: 109
Creatinine, Ser: 1.09
GFR calc Af Amer: >60
GFR calc non Af Amer: >60
Glucose, Bld: 96
Potassium: 4.3
Sodium: 138
Total Bilirubin: 1.1
Total Protein: 7.4

## 2010-11-10 LAB — BASIC METABOLIC PANEL
BUN: 14
BUN: 18
BUN: 19
BUN: 23
BUN: 24 — ABNORMAL HIGH
BUN: 24 — ABNORMAL HIGH
BUN: 24 — ABNORMAL HIGH
CO2: 24
CO2: 26
CO2: 27
CO2: 27
CO2: 29
CO2: 30
CO2: 30
Calcium: 8.3 — ABNORMAL LOW
Calcium: 8.7
Calcium: 8.7
Calcium: 8.8
Calcium: 8.9
Calcium: 9
Calcium: 9.1
Chloride: 102
Chloride: 104
Chloride: 104
Chloride: 105
Chloride: 105
Chloride: 107
Chloride: 107
Creatinine, Ser: 1.03
Creatinine, Ser: 1.18
Creatinine, Ser: 1.22
Creatinine, Ser: 1.3
Creatinine, Ser: 1.44
Creatinine, Ser: 1.66 — ABNORMAL HIGH
Creatinine, Ser: 1.68 — ABNORMAL HIGH
GFR calc Af Amer: 48 — ABNORMAL LOW
GFR calc Af Amer: 48 — ABNORMAL LOW
GFR calc Af Amer: 57 — ABNORMAL LOW
GFR calc Af Amer: >60
GFR calc Af Amer: >60
GFR calc Af Amer: >60
GFR calc Af Amer: >60
GFR calc non Af Amer: 40 — ABNORMAL LOW
GFR calc non Af Amer: 40 — ABNORMAL LOW
GFR calc non Af Amer: 47 — ABNORMAL LOW
GFR calc non Af Amer: 53 — ABNORMAL LOW
GFR calc non Af Amer: 57 — ABNORMAL LOW
GFR calc non Af Amer: 59 — ABNORMAL LOW
GFR calc non Af Amer: >60
Glucose, Bld: 100 — ABNORMAL HIGH
Glucose, Bld: 120 — ABNORMAL HIGH
Glucose, Bld: 126 — ABNORMAL HIGH
Glucose, Bld: 127 — ABNORMAL HIGH
Glucose, Bld: 128 — ABNORMAL HIGH
Glucose, Bld: 154 — ABNORMAL HIGH
Glucose, Bld: 92
Potassium: 4.1
Potassium: 4.2
Potassium: 4.2
Potassium: 4.2
Potassium: 4.4
Potassium: 4.5
Potassium: 4.7
Sodium: 135
Sodium: 136
Sodium: 136
Sodium: 139
Sodium: 139
Sodium: 139
Sodium: 142

## 2010-11-10 LAB — POCT I-STAT, CHEM 8
BUN: 13
Calcium, Ion: 1.31
Chloride: 108
Creatinine, Ser: 1.4
Glucose, Bld: 70
HCT: 32 — ABNORMAL LOW
Hemoglobin: 10.9 — ABNORMAL LOW
Potassium: 4.2
Sodium: 144
TCO2: 25

## 2010-11-10 LAB — ABO/RH: ABO/RH(D): O POS

## 2010-11-10 LAB — URINALYSIS, ROUTINE W REFLEX MICROSCOPIC
Bilirubin Urine: NEGATIVE
Glucose, UA: NEGATIVE
Hgb urine dipstick: NEGATIVE
Ketones, ur: NEGATIVE
Nitrite: NEGATIVE
Protein, ur: NEGATIVE
Specific Gravity, Urine: 1.018
Urobilinogen, UA: 1
pH: 6

## 2010-11-10 LAB — CK TOTAL AND CKMB (NOT AT ARMC)
CK, MB: 10.1 — ABNORMAL HIGH
Relative Index: 3 — ABNORMAL HIGH
Total CK: 340 — ABNORMAL HIGH

## 2010-11-10 LAB — MAGNESIUM
Magnesium: 2.1
Magnesium: 2.1
Magnesium: 2.6 — ABNORMAL HIGH
Magnesium: 2.6 — ABNORMAL HIGH
Magnesium: 2.9 — ABNORMAL HIGH

## 2010-11-10 LAB — APTT
aPTT: 32
aPTT: 32
aPTT: 38 — ABNORMAL HIGH

## 2010-11-10 LAB — TYPE AND SCREEN
ABO/RH(D): O POS
Antibody Screen: NEGATIVE

## 2010-11-10 LAB — URINE MICROSCOPIC-ADD ON

## 2010-11-10 LAB — POCT I-STAT GLUCOSE
Glucose, Bld: 100 — ABNORMAL HIGH
Operator id: 173791

## 2010-11-10 LAB — HEMOGLOBIN A1C
Hgb A1c MFr Bld: 5.1
Mean Plasma Glucose: 104

## 2010-11-10 LAB — HEMOGLOBIN AND HEMATOCRIT, BLOOD
HCT: 24.4 — ABNORMAL LOW
Hemoglobin: 8.5 — ABNORMAL LOW

## 2010-11-10 LAB — HEPARIN ANTIBODY SCREEN: Heparin Antibody Screen: NEGATIVE

## 2010-11-10 LAB — CREATININE, SERUM
Creatinine, Ser: 1.12
GFR calc Af Amer: >60
GFR calc non Af Amer: >60

## 2010-11-10 LAB — PLATELET COUNT: Platelets: 124 — ABNORMAL LOW

## 2011-04-24 DIAGNOSIS — Z8551 Personal history of malignant neoplasm of bladder: Secondary | ICD-10-CM | POA: Diagnosis not present

## 2011-04-24 DIAGNOSIS — N401 Enlarged prostate with lower urinary tract symptoms: Secondary | ICD-10-CM | POA: Diagnosis not present

## 2011-05-14 DIAGNOSIS — I119 Hypertensive heart disease without heart failure: Secondary | ICD-10-CM | POA: Diagnosis not present

## 2011-05-14 DIAGNOSIS — M7020 Olecranon bursitis, unspecified elbow: Secondary | ICD-10-CM | POA: Diagnosis not present

## 2011-05-14 DIAGNOSIS — E7800 Pure hypercholesterolemia, unspecified: Secondary | ICD-10-CM | POA: Diagnosis not present

## 2011-05-14 DIAGNOSIS — I2510 Atherosclerotic heart disease of native coronary artery without angina pectoris: Secondary | ICD-10-CM | POA: Diagnosis not present

## 2011-09-07 DIAGNOSIS — Z79899 Other long term (current) drug therapy: Secondary | ICD-10-CM | POA: Diagnosis not present

## 2011-09-07 DIAGNOSIS — I119 Hypertensive heart disease without heart failure: Secondary | ICD-10-CM | POA: Diagnosis not present

## 2011-09-07 DIAGNOSIS — I2510 Atherosclerotic heart disease of native coronary artery without angina pectoris: Secondary | ICD-10-CM | POA: Diagnosis not present

## 2011-09-07 DIAGNOSIS — E7800 Pure hypercholesterolemia, unspecified: Secondary | ICD-10-CM | POA: Diagnosis not present

## 2011-09-08 DIAGNOSIS — Z79899 Other long term (current) drug therapy: Secondary | ICD-10-CM | POA: Diagnosis not present

## 2011-09-08 DIAGNOSIS — I119 Hypertensive heart disease without heart failure: Secondary | ICD-10-CM | POA: Diagnosis not present

## 2011-09-08 DIAGNOSIS — E7800 Pure hypercholesterolemia, unspecified: Secondary | ICD-10-CM | POA: Diagnosis not present

## 2011-09-11 DIAGNOSIS — E782 Mixed hyperlipidemia: Secondary | ICD-10-CM | POA: Diagnosis not present

## 2011-09-11 DIAGNOSIS — I10 Essential (primary) hypertension: Secondary | ICD-10-CM | POA: Diagnosis not present

## 2011-09-11 DIAGNOSIS — Z951 Presence of aortocoronary bypass graft: Secondary | ICD-10-CM | POA: Diagnosis not present

## 2011-09-11 DIAGNOSIS — I2510 Atherosclerotic heart disease of native coronary artery without angina pectoris: Secondary | ICD-10-CM | POA: Diagnosis not present

## 2012-01-11 DIAGNOSIS — Z79899 Other long term (current) drug therapy: Secondary | ICD-10-CM | POA: Diagnosis not present

## 2012-01-11 DIAGNOSIS — E7800 Pure hypercholesterolemia, unspecified: Secondary | ICD-10-CM | POA: Diagnosis not present

## 2012-01-11 DIAGNOSIS — I119 Hypertensive heart disease without heart failure: Secondary | ICD-10-CM | POA: Diagnosis not present

## 2012-04-26 DIAGNOSIS — E291 Testicular hypofunction: Secondary | ICD-10-CM | POA: Diagnosis not present

## 2012-04-27 DIAGNOSIS — H6120 Impacted cerumen, unspecified ear: Secondary | ICD-10-CM | POA: Diagnosis not present

## 2012-04-27 DIAGNOSIS — H905 Unspecified sensorineural hearing loss: Secondary | ICD-10-CM | POA: Diagnosis not present

## 2012-04-27 DIAGNOSIS — R42 Dizziness and giddiness: Secondary | ICD-10-CM | POA: Diagnosis not present

## 2012-04-27 DIAGNOSIS — H903 Sensorineural hearing loss, bilateral: Secondary | ICD-10-CM | POA: Diagnosis not present

## 2012-04-28 DIAGNOSIS — Z79899 Other long term (current) drug therapy: Secondary | ICD-10-CM | POA: Diagnosis not present

## 2012-04-28 DIAGNOSIS — I119 Hypertensive heart disease without heart failure: Secondary | ICD-10-CM | POA: Diagnosis not present

## 2012-04-28 DIAGNOSIS — R42 Dizziness and giddiness: Secondary | ICD-10-CM | POA: Diagnosis not present

## 2012-04-28 DIAGNOSIS — I2510 Atherosclerotic heart disease of native coronary artery without angina pectoris: Secondary | ICD-10-CM | POA: Diagnosis not present

## 2012-04-28 DIAGNOSIS — E7800 Pure hypercholesterolemia, unspecified: Secondary | ICD-10-CM | POA: Diagnosis not present

## 2012-04-28 DIAGNOSIS — I4510 Unspecified right bundle-branch block: Secondary | ICD-10-CM | POA: Diagnosis not present

## 2012-06-23 DIAGNOSIS — I2510 Atherosclerotic heart disease of native coronary artery without angina pectoris: Secondary | ICD-10-CM | POA: Diagnosis not present

## 2012-06-23 DIAGNOSIS — E7800 Pure hypercholesterolemia, unspecified: Secondary | ICD-10-CM | POA: Diagnosis not present

## 2012-06-23 DIAGNOSIS — R259 Unspecified abnormal involuntary movements: Secondary | ICD-10-CM | POA: Diagnosis not present

## 2012-06-23 DIAGNOSIS — I4510 Unspecified right bundle-branch block: Secondary | ICD-10-CM | POA: Diagnosis not present

## 2012-07-15 ENCOUNTER — Ambulatory Visit (INDEPENDENT_AMBULATORY_CARE_PROVIDER_SITE_OTHER): Payer: Medicare Other | Admitting: Neurology

## 2012-07-15 ENCOUNTER — Encounter: Payer: Self-pay | Admitting: Neurology

## 2012-07-15 VITALS — BP 162/83 | HR 73 | Ht 67.0 in | Wt 186.0 lb

## 2012-07-15 DIAGNOSIS — G250 Essential tremor: Secondary | ICD-10-CM | POA: Diagnosis not present

## 2012-07-15 DIAGNOSIS — G252 Other specified forms of tremor: Secondary | ICD-10-CM | POA: Diagnosis not present

## 2012-07-15 HISTORY — DX: Essential tremor: G25.0

## 2012-07-15 MED ORDER — ALPRAZOLAM 0.25 MG PO TABS: 0.2500 mg | ORAL_TABLET | Freq: Three times a day (TID) | ORAL | Status: DC | PRN

## 2012-07-15 NOTE — Progress Notes (Signed)
Reason for visit: Tremor  [name] is a 79 y.o. male  History of present illness:  [name] is a 79 year old right-handed black male with a history of a tremor that dates back at least 2 years. The tremor is more prominent on the left arm, but still on the right arm to some degree. The patient mainly has tremor when he is trying to do something with his hands, particularly when he is holding a light weight in his hand. The patient will note when he is picking up the newspaper, the newspaper may shake or rattle. The patient denies any family history of tremor. The patient has no tremor involving the head or neck, or a vocal tremor. The patient denies any significant issues with his handwriting, but he will note at times when he brings a glass of water to his mouth, he will have tremor. The patient denies any numbness or weakness of the arms or legs. The patient is sent to this office for further evaluation. The patient does not believe that he has had a recent thyroid study.   Past Medical History  Diagnosis Date  . Heart disease   . Essential and other specified forms of tremor 07/15/2012    Past Surgical History  Procedure Laterality Date  . Knee surgery Right 2006  . Coronary artery bypass graft  2009    Family History  Problem Relation Age of Onset  . Pneumonia Mother   . Cancer Father   . Cancer Brother     Social history:  reports that he has never smoked. He does not have any smokeless tobacco history on file. He reports that  drinks alcohol. He reports that he does not use illicit drugs.  Medications:  No current outpatient prescriptions on file prior to visit.   No current facility-administered medications on file prior to visit.    Allergies: No Known Allergies  ROS:  Out of a complete 14 system review of symptoms, the patient complains only of the following symptoms, and all other reviewed systems are negative.  Tremor Urination problems  Blood  pressure 162/83, pulse 73, height 5\' 7"  (1.702 m), weight 186 lb (84.369 kg).  Physical Exam  General: The patient is alert and cooperative at the time of the examination.The patient is moderately obese.  Head: Pupils are equal, round, and reactive to light. Discs are flat bilaterally.  Neck: The neck is supple, no carotid bruits are noted.  Respiratory: The respiratory examination is clear.  Cardiovascular: The cardiovascular examination reveals an occasionally irregular heart rhythm, with a grade II/VI systolic murmur noted.  Skin: Extremities are with 1+ edema ankles bilaterally.  Neurologic Exam  Mental status:  Cranial nerves: Facial symmetry is present. There is good sensation of the face to pinprick and soft touch bilaterally. The strength of the facial muscles and the muscles to head turning and shoulder shrug are normal bilaterally. Speech is well enunciated, no aphasia or dysarthria is noted. Extraocular movements are full. Visual fields are full.  Motor: The motor testing reveals 5 over 5 strength of all 4 extremities. Good symmetric motor tone is noted throughout.  Sensory: Sensory testing is intact to pinprick, soft touch, vibration sensation, and position sense on all 4 extremities. No evidence of extinction is noted.  Coordination: Cerebellar testing reveals good finger-nose-finger and heel-to-shin bilaterally. Minimal tremor is noted with the arms outstretched, left greater than right.   Gait and station: Gait is normal. Tandem gait is slightly unsteady.  Romberg is negative. No drift is seen.  Reflexes: Deep tendon reflexes are symmetric and normal bilaterally. Toes are equivocal  bilaterally.   Assessment/Plan:  One. Probable benign essential tremor  The patient has no features of parkinsonism, and he has had a long duration tremor that has been very slowly progressive. There is some asymmetry with the tremor, but the left arm is more affected than the right.  The patient will be sent for a thyroid study, and he will be given a small prescription for alprazolam to take if needed for the tremor. The patient will followup in 6 months. A CT scan of the brain will be done to exclude cerebrovascular disease as an etiology for the tremor.  Marlan Palau MD 07/16/2012 6:57 PM  [facility] [address] [place], [place] [address]  Phone [phone] Fax [phone]

## 2012-07-22 DIAGNOSIS — G250 Essential tremor: Secondary | ICD-10-CM | POA: Diagnosis not present

## 2012-07-23 ENCOUNTER — Encounter (HOSPITAL_BASED_OUTPATIENT_CLINIC_OR_DEPARTMENT_OTHER): Payer: Self-pay

## 2012-07-23 ENCOUNTER — Emergency Department (HOSPITAL_BASED_OUTPATIENT_CLINIC_OR_DEPARTMENT_OTHER)
Admission: EM | Admit: 2012-07-23 | Discharge: 2012-07-23 | Disposition: A | Discharge status: Home / Self Care | Payer: Medicare Other | Attending: Emergency Medicine | Admitting: Emergency Medicine

## 2012-07-23 DIAGNOSIS — Z8679 Personal history of other diseases of the circulatory system: Secondary | ICD-10-CM | POA: Insufficient documentation

## 2012-07-23 DIAGNOSIS — Z8669 Personal history of other diseases of the nervous system and sense organs: Secondary | ICD-10-CM | POA: Diagnosis not present

## 2012-07-23 DIAGNOSIS — K625 Hemorrhage of anus and rectum: Secondary | ICD-10-CM | POA: Diagnosis not present

## 2012-07-23 DIAGNOSIS — K921 Melena: Secondary | ICD-10-CM | POA: Insufficient documentation

## 2012-07-23 DIAGNOSIS — Z79899 Other long term (current) drug therapy: Secondary | ICD-10-CM | POA: Diagnosis not present

## 2012-07-23 LAB — BASIC METABOLIC PANEL
BUN: 18 mg/dL (ref 6–23)
Chloride: 109 mEq/L (ref 96–112)
GFR calc Af Amer: 62 mL/min — ABNORMAL LOW (ref >90)
Potassium: 3.7 mEq/L (ref 3.5–5.1)

## 2012-07-23 LAB — CBC WITH DIFFERENTIAL/PLATELET
Basophils Relative: 1 % (ref 0–1)
Hemoglobin: 13.5 g/dL (ref 13.0–17.0)
MCHC: 35.5 g/dL (ref 30.0–36.0)
Monocytes Relative: 12 % (ref 3–12)
Neutro Abs: 1.9 10*3/uL (ref 1.7–7.7)
Neutrophils Relative %: 48 % (ref 43–77)
RBC: 3.86 MIL/uL — ABNORMAL LOW (ref 4.22–5.81)
WBC: 3.9 10*3/uL — ABNORMAL LOW (ref 4.0–10.5)

## 2012-07-23 LAB — TSH: TSH: 3.52 u[IU]/mL (ref 0.450–4.500)

## 2012-07-23 LAB — OCCULT BLOOD X 1 CARD TO LAB, STOOL: Fecal Occult Bld: POSITIVE — AB

## 2012-07-23 MED ORDER — PRAMOXINE HCL 1 % RE FOAM: RECTAL | Status: DC | PRN

## 2012-07-23 MED ORDER — DOCUSATE SODIUM 100 MG PO CAPS: 100.0000 mg | ORAL_CAPSULE | Freq: Two times a day (BID) | ORAL | Status: DC

## 2012-07-23 NOTE — ED Provider Notes (Signed)
History     CSN: [id number]  Arrival date & time 07/23/12  1146   First MD Initiated Contact with Patient 07/23/12 1216      Chief Complaint  Patient presents with  . Rectal Bleeding    (Consider location/radiation/quality/duration/timing/severity/associated sxs/prior treatment) Patient is a 79 y.o. male presenting with hematochezia. The history is provided by the patient. No language interpreter was used.  Rectal Bleeding Quality:  Bright red Amount:  Scant Duration:  12 hours Timing:  Intermittent Progression:  Partially resolved Chronicity:  New Context: not anal fissures, not constipation and not rectal pain   Similar prior episodes: no   Relieved by:  Nothing Worsened by:  Nothing tried Associated symptoms: no abdominal pain, no dizziness and no recent illness     Past Medical History  Diagnosis Date  . Heart disease   . Essential and other specified forms of tremor 07/15/2012    Past Surgical History  Procedure Laterality Date  . Knee surgery Right 2006  . Coronary artery bypass graft  2009    Family History  Problem Relation Age of Onset  . Pneumonia Mother   . Cancer Father   . Cancer Brother     History  Substance Use Topics  . Smoking status: Never Smoker   . Smokeless tobacco: Not on file  . Alcohol Use: No      Review of Systems  Gastrointestinal: Positive for hematochezia and anal bleeding. Negative for abdominal pain and rectal pain.  Neurological: Negative for dizziness.  All other systems reviewed and are negative.    Allergies  Review of patient's allergies indicates no known allergies.  Home Medications   Current Outpatient Rx  Name  Route  Sig  Dispense  Refill  . ALPRAZolam (XANAX) 0.25 MG tablet   Oral   Take 1 tablet (0.25 mg total) by mouth 3 (three) times daily as needed for sleep.   30 tablet   2   . Metoprolol-Hydrochlorothiazide (DUTOPROL PO)   Oral   Take 1 tablet by mouth daily.           BP 183/60   Pulse 68  Temp(Src) 97.8 F (36.6 C) (Oral)  Resp 16  Ht 5\' 8"  (1.727 m)  Wt 185 lb (83.915 kg)  BMI 28.14 kg/m2  SpO2 98%  Physical Exam  Nursing note and vitals reviewed. Constitutional: He appears well-developed and well-nourished.  Cardiovascular: Normal rate.   Pulmonary/Chest: Effort normal.  Abdominal: Soft. There is no tenderness. There is no guarding.  Genitourinary: Guaiac positive stool.  Musculoskeletal: Normal range of motion.  Neurological: He is alert.  Skin: Skin is warm.  Psychiatric: He has a normal mood and affect.    ED Course  Procedures (including critical care time)  Labs Reviewed  CBC WITH DIFFERENTIAL - Abnormal; Notable for the following:    WBC 3.9 (*)    RBC 3.86 (*)    HCT 38.0 (*)    MCH 35.0 (*)    Platelets 130 (*)    All other components within normal limits  BASIC METABOLIC PANEL - Abnormal; Notable for the following:    GFR calc non Af Amer 53 (*)    GFR calc Af Amer 62 (*)    All other components within normal limits  OCCULT BLOOD X 1 CARD TO LAB, STOOL - Abnormal; Notable for the following:    Fecal Occult Bld POSITIVE (*)    All other components within normal limits   No results found.  No diagnosis found.    MDM   Results for orders placed during the hospital encounter of 07/23/12  CBC WITH DIFFERENTIAL      Result Value Range   WBC 3.9 (*) 4.0 - 10.5 K/uL   RBC 3.86 (*) 4.22 - 5.81 MIL/uL   Hemoglobin 13.5  13.0 - 17.0 g/dL   HCT 96.0 (*) 45.4 - 09.8 %   MCV 98.4  78.0 - 100.0 fL   MCH 35.0 (*) 26.0 - 34.0 pg   MCHC 35.5  30.0 - 36.0 g/dL   RDW 11.9  14.7 - 82.9 %   Platelets 130 (*) 150 - 400 K/uL   Neutrophils Relative % 48  43 - 77 %   Neutro Abs 1.9  1.7 - 7.7 K/uL   Lymphocytes Relative 36  12 - 46 %   Lymphs Abs 1.4  0.7 - 4.0 K/uL   Monocytes Relative 12  3 - 12 %   Monocytes Absolute 0.5  0.1 - 1.0 K/uL   Eosinophils Relative 4  0 - 5 %   Eosinophils Absolute 0.2  0.0 - 0.7 K/uL   Basophils Relative  1  0 - 1 %   Basophils Absolute 0.0  0.0 - 0.1 K/uL  BASIC METABOLIC PANEL      Result Value Range   Sodium 143  135 - 145 mEq/L   Potassium 3.7  3.5 - 5.1 mEq/L   Chloride 109  96 - 112 mEq/L   CO2 26  19 - 32 mEq/L   Glucose, Bld 93  70 - 99 mg/dL   BUN 18  6 - 23 mg/dL   Creatinine, Ser 5.62  0.50 - 1.35 mg/dL   Calcium 9.8  8.4 - 13.0 mg/dL   GFR calc non Af Amer 53 (*) >90 mL/min   GFR calc Af Amer 62 (*) >90 mL/min  OCCULT BLOOD X 1 CARD TO LAB, STOOL      Result Value Range   Fecal Occult Bld POSITIVE (*) NEGATIVE   No results found.   Labs normal,  Dried blood in underwear.   I will treat with proctofoam.    Pt advised to see Dr. Petra Kuba for recheck.  Pt advised he needs Gi evaluation.      Lonia Skinner [place], PA-C 07/23/12 1429

## 2012-07-23 NOTE — ED Notes (Signed)
Pt states that he is having rectal bleeding, started last night, pt states that over night he bled on his underwear and then cleaned up this morning and has not seen any further rectal bleeding.

## 2012-07-23 NOTE — ED Provider Notes (Signed)
The patient seen by me. History of some rectal bleeding but not extensive hemoglobin and hematocrit is normal here vital signs are normal. Patient in no acute distress nontoxic patient given cautions and will followup with their doctor for further evaluation May need a repeat colonoscopy.  Shelda Jakes, MD 07/23/12 [phone]

## 2012-07-26 NOTE — Progress Notes (Signed)
Quick Note:  Called patient and left message normal labs. ______ 

## 2012-08-11 ENCOUNTER — Ambulatory Visit
Admission: RE | Admit: 2012-08-11 | Discharge: 2012-08-11 | Disposition: A | Discharge status: Home / Self Care | Payer: Medicare Other | Source: Ambulatory Visit | Attending: Neurology | Admitting: Neurology

## 2012-08-11 ENCOUNTER — Other Ambulatory Visit: Payer: TRICARE For Life (TFL)

## 2012-08-11 DIAGNOSIS — G252 Other specified forms of tremor: Secondary | ICD-10-CM

## 2012-08-11 DIAGNOSIS — R259 Unspecified abnormal involuntary movements: Secondary | ICD-10-CM

## 2012-08-14 ENCOUNTER — Telehealth: Payer: Self-pay | Admitting: Neurology

## 2012-08-14 NOTE — Telephone Encounter (Signed)
I called patient. The CT scan of brain appear to be relatively unremarkable. Minimal small vessel disease. The patient likely has a benign essential tremor.

## 2012-08-16 DIAGNOSIS — I119 Hypertensive heart disease without heart failure: Secondary | ICD-10-CM | POA: Diagnosis not present

## 2012-08-16 DIAGNOSIS — Z125 Encounter for screening for malignant neoplasm of prostate: Secondary | ICD-10-CM | POA: Diagnosis not present

## 2012-08-16 DIAGNOSIS — Z79899 Other long term (current) drug therapy: Secondary | ICD-10-CM | POA: Diagnosis not present

## 2012-08-16 DIAGNOSIS — G250 Essential tremor: Secondary | ICD-10-CM | POA: Diagnosis not present

## 2012-08-16 DIAGNOSIS — G252 Other specified forms of tremor: Secondary | ICD-10-CM | POA: Diagnosis not present

## 2012-08-16 DIAGNOSIS — I25810 Atherosclerosis of coronary artery bypass graft(s) without angina pectoris: Secondary | ICD-10-CM | POA: Diagnosis not present

## 2012-08-16 DIAGNOSIS — I4510 Unspecified right bundle-branch block: Secondary | ICD-10-CM | POA: Diagnosis not present

## 2012-08-16 DIAGNOSIS — E7800 Pure hypercholesterolemia, unspecified: Secondary | ICD-10-CM | POA: Diagnosis not present

## 2012-09-14 ENCOUNTER — Ambulatory Visit (INDEPENDENT_AMBULATORY_CARE_PROVIDER_SITE_OTHER): Payer: Medicare Other | Admitting: Cardiovascular Disease

## 2012-09-14 ENCOUNTER — Encounter: Payer: Self-pay | Admitting: Cardiovascular Disease

## 2012-09-14 VITALS — BP 140/66 | HR 70 | Ht 68.0 in | Wt 186.0 lb

## 2012-09-14 DIAGNOSIS — N400 Enlarged prostate without lower urinary tract symptoms: Secondary | ICD-10-CM

## 2012-09-14 DIAGNOSIS — I4510 Unspecified right bundle-branch block: Secondary | ICD-10-CM | POA: Diagnosis not present

## 2012-09-14 DIAGNOSIS — I441 Atrioventricular block, second degree: Secondary | ICD-10-CM | POA: Diagnosis not present

## 2012-09-14 DIAGNOSIS — I2510 Atherosclerotic heart disease of native coronary artery without angina pectoris: Secondary | ICD-10-CM | POA: Diagnosis not present

## 2012-09-14 DIAGNOSIS — N529 Male erectile dysfunction, unspecified: Secondary | ICD-10-CM

## 2012-09-14 DIAGNOSIS — E785 Hyperlipidemia, unspecified: Secondary | ICD-10-CM

## 2012-09-14 DIAGNOSIS — G252 Other specified forms of tremor: Secondary | ICD-10-CM

## 2012-09-14 DIAGNOSIS — I739 Peripheral vascular disease, unspecified: Secondary | ICD-10-CM

## 2012-09-14 MED ORDER — TADALAFIL 5 MG PO TABS: 5.0000 mg | ORAL_TABLET | Freq: Every day | ORAL | Status: DC

## 2012-09-14 MED ORDER — AMLODIPINE BESYLATE 5 MG PO TABS: 5.0000 mg | ORAL_TABLET | Freq: Every day | ORAL | Status: DC

## 2012-09-14 MED ORDER — METOPROLOL TARTRATE 25 MG PO TABS: 12.5000 mg | ORAL_TABLET | Freq: Two times a day (BID) | ORAL | Status: DC

## 2012-09-14 MED ORDER — HYDROCHLOROTHIAZIDE 12.5 MG PO CAPS: 12.5000 mg | ORAL_CAPSULE | Freq: Every day | ORAL | Status: DC

## 2012-09-14 MED ORDER — TADALAFIL 2.5 MG PO TABS: 2.5000 mg | ORAL_TABLET | Freq: Every day | ORAL | Status: DC

## 2012-09-14 NOTE — Patient Instructions (Addendum)
Your physician has recommended you make the following change in your medication:   Stop Dutoprol HCT Take metoprolol tartrate 25 mg one half tablet twice daily for 4 days only Start hydrochlorothiazide 12.5 mg once daily Start amlodipine 5 mg once daily Start Cialis 2.5 mg once daily  Your physician recommends that you schedule a follow-up appointment in: 3-4 weeks

## 2012-09-17 DIAGNOSIS — I739 Peripheral vascular disease, unspecified: Secondary | ICD-10-CM | POA: Insufficient documentation

## 2012-09-17 DIAGNOSIS — I441 Atrioventricular block, second degree: Secondary | ICD-10-CM | POA: Insufficient documentation

## 2012-09-17 DIAGNOSIS — I2510 Atherosclerotic heart disease of native coronary artery without angina pectoris: Secondary | ICD-10-CM | POA: Insufficient documentation

## 2012-09-17 DIAGNOSIS — N529 Male erectile dysfunction, unspecified: Secondary | ICD-10-CM | POA: Insufficient documentation

## 2012-09-17 DIAGNOSIS — E785 Hyperlipidemia, unspecified: Secondary | ICD-10-CM | POA: Insufficient documentation

## 2012-09-17 DIAGNOSIS — I4510 Unspecified right bundle-branch block: Secondary | ICD-10-CM | POA: Insufficient documentation

## 2012-09-17 DIAGNOSIS — N400 Enlarged prostate without lower urinary tract symptoms: Secondary | ICD-10-CM | POA: Insufficient documentation

## 2012-09-17 HISTORY — DX: Peripheral vascular disease, unspecified: I73.9

## 2012-09-17 HISTORY — DX: Hyperlipidemia, unspecified: E78.5

## 2012-09-17 HISTORY — DX: Male erectile dysfunction, unspecified: N52.9

## 2012-09-17 HISTORY — DX: Atrioventricular block, second degree: I44.1

## 2012-09-17 HISTORY — DX: Benign prostatic hyperplasia without lower urinary tract symptoms: N40.0

## 2012-09-17 NOTE — Assessment & Plan Note (Addendum)
I think his a beta blocker may now be more of a liability than a benefit. He will wean off the Dutoprol over the next few days. We will replace it with amlodipine for his blood pressure and give a separate prescription for hydrochlorothiazide. No indication for pacemaker yet

## 2012-09-17 NOTE — Assessment & Plan Note (Signed)
Asymptomatic. Preserved left ventricular systolic function. Risks factor modification as mainstay of therapy. Status critical risk of combining phosphodiesterase inhibitors for erectile dysfunction with any type of nitrate product

## 2012-09-17 NOTE — Progress Notes (Signed)
Patient ID: [name], male   DOB: September 06, 1924, 79 y.o.   MRN: [id number]     Reason for office visit CAD, second degree AV block, hypertension, erectile dysfunction.  [name] is a remarkably active and fit 60 total man who continues to exercise on a daily basis. He has no cardiac complaints today but does complain of symptoms of prostatism as well as erectile dysfunction. He is on chronic beta blocker therapy and is now roughly 4 years status post multivessel coronary bypass surgery. During echocardiography was noted to have asymptomatic pauses on Holter monitor demonstrates that he has periods of second-degree AV block. He also has PVCs. This makes for a rather irregular rhythm. He is unaware of this. He denies syncope, dizziness or fatigue.    No Known Allergies  Current Outpatient Prescriptions  Medication Sig Dispense Refill  . ALPRAZolam (XANAX) 0.25 MG tablet Take 1 tablet (0.25 mg total) by mouth 3 (three) times daily as needed for sleep.  30 tablet  2  . docusate sodium (COLACE) 100 MG capsule Take 1 capsule (100 mg total) by mouth every 12 (twelve) hours.  60 capsule  0  . OVER THE COUNTER MEDICATION daily. Argi-Vive III dietary supplement      . OVER THE COUNTER MEDICATION daily. T-Boost caplets      . pramoxine (PROCTOFOAM) 1 % foam Place rectally every 2 (two) hours as needed for hemorrhoids.  15 g  0  . Prasterone (DEHYDROEPIANDROSTERONE) CRYS by Does not apply route.      Marland Kitchen amLODipine (NORVASC) 5 MG tablet Take 1 tablet (5 mg total) by mouth daily.  30 tablet  11  . hydrochlorothiazide (MICROZIDE) 12.5 MG capsule Take 1 capsule (12.5 mg total) by mouth daily.  30 capsule  11  . metoprolol tartrate (LOPRESSOR) 25 MG tablet Take 0.5 tablets (12.5 mg total) by mouth 2 (two) times daily. Take 1/2 tablet 2 times daily for 4 days only, then stop.  4 tablet  0  . Tadalafil (CIALIS) 2.5 MG TABS Take 1 tablet (2.5 mg total) by mouth daily.  30 tablet  6   No current  facility-administered medications for this visit.    Past Medical History  Diagnosis Date  . Heart disease   . Essential and other specified forms of tremor 07/15/2012    Past Surgical History  Procedure Laterality Date  . Knee surgery Right 2006  . Coronary artery bypass graft  2009    Family History  Problem Relation Age of Onset  . Pneumonia Mother   . Cancer Father   . Cancer Brother     History   Social History  . Marital Status: Married    Spouse Name: N/A    Number of Children: N/A  . Years of Education: N/A   Occupational History  . Not on file.   Social History Main Topics  . Smoking status: Never Smoker   . Smokeless tobacco: Not on file  . Alcohol Use: No  . Drug Use: No  . Sexually Active: Not on file   Other Topics Concern  . Not on file   Social History Narrative  . No narrative on file    Review of systems: The patient specifically denies any chest pain at rest or with exertion, dyspnea at rest or with exertion, orthopnea, paroxysmal nocturnal dyspnea, syncope, palpitations, focal neurological deficits, intermittent claudication, lower extremity edema, unexplained weight gain, cough, hemoptysis or wheezing.  The patient also denies abdominal pain, nausea, vomiting,  dysphagia, diarrhea, constipation, polyuria, polydipsia, abnormal bleeding or bruising, fever, chills, unexpected weight changes, mood swings, change in skin or hair texture, change in voice quality, auditory or visual problems, allergic reactions or rashes, new musculoskeletal complaints other than usual "aches and pains".  He has erectile dysfunction and dysuria, hematuria, frequency, urgency. He has seen Dr.Nesi in urology consultation in the past.   PHYSICAL EXAM BP 140/66  Pulse 70  Ht 5\' 8"  (1.727 m)  Wt 186 lb (84.369 kg)  BMI 28.29 kg/m2  General: Alert, oriented x3, no distress Head: no evidence of trauma, PERRL, EOMI, no exophtalmos or lid lag, no myxedema, no  xanthelasma; normal ears, nose and oropharynx Neck: normal jugular venous pulsations and no hepatojugular reflux; brisk carotid pulses without delay and no carotid bruits Chest: clear to auscultation, no signs of consolidation by percussion or palpation, normal fremitus, symmetrical and full respiratory excursions Cardiovascular: normal position and quality of the apical impulse, regular rhythm with occasional irregularity, normal first and widely split second heart sounds, no murmurs, rubs or gallops Abdomen: no tenderness or distention, no masses by palpation, no abnormal pulsatility or arterial bruits, normal bowel sounds, no hepatosplenomegaly Extremities: no clubbing, cyanosis or edema; 2+ radial, ulnar and brachial pulses bilaterally; 2+ right femoral, posterior tibial and dorsalis pedis pulses; 2+ left femoral, posterior tibial and dorsalis pedis pulses; no subclavian or femoral bruits Neurological: grossly nonfocal   EKG: Sinus rhythm, a single PVC, right bundle branch block, anterolateral ST depression and T-wave inversion unchanged from previous tracings.  Lipid Panel  No results found for this basename: chol, trig, hdl, cholhdl, vldl, ldlcalc    BMET    Component Value Date/Time   NA 143 07/23/2012 1310   K 3.7 07/23/2012 1310   CL 109 07/23/2012 1310   CO2 26 07/23/2012 1310   GLUCOSE 93 07/23/2012 1310   BUN 18 07/23/2012 1310   CREATININE 1.20 07/23/2012 1310   CALCIUM 9.8 07/23/2012 1310   GFRNONAA 53* 07/23/2012 1310   GFRAA 62* 07/23/2012 1310     ASSESSMENT AND PLAN CAD (coronary artery disease)s/p CABG (LIMA to LAD, SVG sequential to first and second OM, SVG to distal RCA) 2009 Asymptomatic. Preserved left ventricular systolic function. Risks factor modification as mainstay of therapy. Status critical risk of combining phosphodiesterase inhibitors for erectile dysfunction with any type of nitrate product  Dyslipidemia It is time to recheck a lipid profile. I wonder about his  compliance with the medication. In October 2012 total cholesterol is 185 HDL 68 triglycerides 102 and LDL 103 in March of the next year total cholesterol was 211, HDL 53 tragus as and 22 and LDL 139. This suggests to me less-than-perfect adherence to statin therapy. The purpose of this medication was reviewed  Erectile dysfunction I wonder whether she could find improvement in both his erectile problems and urination problems by taking Cialis. I'm not sure whether his insurance will cover this medication. We will try 2.5 mg daily.  Essential and other specified forms of tremor This may worsen when we discontinue the beta blocker. Alternative treatments might be necessary  Prostatism    RBBB    Second degree AV block, Mobitz type I I think his a beta blocker may now be more of a liability than a benefit. He will wean off the Dutoprol over the next few days. We will replace it with amlodipine for his blood pressure and give a separate prescription for hydrochlorothiazide. No indication for pacemaker yet  Orders Placed This  Encounter  Procedures  . EKG 12-Lead   Meds ordered this encounter  Medications  . OVER THE COUNTER MEDICATION    Sig: daily. Argi-Vive III dietary supplement  . OVER THE COUNTER MEDICATION    Sig: daily. T-Boost caplets  . Prasterone (DEHYDROEPIANDROSTERONE) CRYS    Sig: by Does not apply route.  . metoprolol tartrate (LOPRESSOR) 25 MG tablet    Sig: Take 0.5 tablets (12.5 mg total) by mouth 2 (two) times daily. Take 1/2 tablet 2 times daily for 4 days only, then stop.    Dispense:  4 tablet    Refill:  0  . hydrochlorothiazide (MICROZIDE) 12.5 MG capsule    Sig: Take 1 capsule (12.5 mg total) by mouth daily.    Dispense:  30 capsule    Refill:  11  . amLODipine (NORVASC) 5 MG tablet    Sig: Take 1 tablet (5 mg total) by mouth daily.    Dispense:  30 tablet    Refill:  11  . DISCONTD: tadalafil (CIALIS) 5 MG tablet    Sig: Take 1 tablet (5 mg total) by  mouth daily.    Dispense:  30 tablet    Refill:  6  . Tadalafil (CIALIS) 2.5 MG TABS    Sig: Take 1 tablet (2.5 mg total) by mouth daily.    Dispense:  30 tablet    Refill:  6    Clarita Mcelvain  [place], MD, [facility] [phone] office [phone] pager

## 2012-09-17 NOTE — Assessment & Plan Note (Signed)
I wonder whether she could find improvement in both his erectile problems and urination problems by taking Cialis. I'm not sure whether his insurance will cover this medication. We will try 2.5 mg daily.

## 2012-09-17 NOTE — Assessment & Plan Note (Signed)
This may worsen when we discontinue the beta blocker. Alternative treatments might be necessary

## 2012-09-17 NOTE — Assessment & Plan Note (Signed)
Asymptomatic roughly 90% left renal artery stenosis, mild bilateral carotid plaque, nonobstructive.

## 2012-09-17 NOTE — Assessment & Plan Note (Addendum)
If symptoms do not improve with Cialis he should followup with Dr. Brunilda Payor

## 2012-09-17 NOTE — Assessment & Plan Note (Signed)
It is time to recheck a lipid profile. I wonder about his compliance with the medication. In October 2012 total cholesterol is 185 HDL 68 triglycerides 102 and LDL 103 in March of the next year total cholesterol was 211, HDL 53 tragus as and 22 and LDL 139. This suggests to me less-than-perfect adherence to statin therapy. The purpose of this medication was reviewed

## 2012-09-20 ENCOUNTER — Telehealth: Payer: Self-pay | Admitting: Cardiovascular Disease

## 2012-09-20 NOTE — Telephone Encounter (Signed)
Calling to see if the medication (Viagra) can be substituted for Cialis because the medicare will not pay for it .. .. Please give him a call    Thanks

## 2012-09-20 NOTE — Telephone Encounter (Signed)
Yes, Viagra 50 mg daily prn. Note approved for ED, not for prostate symptoms as well. (Cialis both)

## 2012-09-20 NOTE — Telephone Encounter (Signed)
Message forwarded to Dr. Croitoru.  

## 2012-09-21 ENCOUNTER — Telehealth: Payer: Self-pay | Admitting: Cardiovascular Disease

## 2012-09-21 NOTE — Telephone Encounter (Signed)
Pt returning your call

## 2012-09-21 NOTE — Telephone Encounter (Signed)
Returned call and spoke w/ pt's wife, Jacqulyn Cane.  Stated pt unavailable.  Asked her to have pt call back today before 4pm.  Agreed and asked if there was a message.  Informed per MD and stated she will have pt call back.

## 2012-09-21 NOTE — Telephone Encounter (Signed)
Pt was calling in regards of a prescription. Pt said he would call you back. He said that he knew what he was talking about.

## 2012-09-21 NOTE — Telephone Encounter (Signed)
Returned call.  Wife stated pt is at [facility] doing some interviews and will call back.  Advised pt call back today before 4pm or call will not be returned until tomorrow.  Verbalized understanding.

## 2012-09-21 NOTE — Telephone Encounter (Signed)
Note already started.  

## 2012-09-22 MED ORDER — SILDENAFIL CITRATE 50 MG PO TABS: 50.0000 mg | ORAL_TABLET | Freq: Every day | ORAL | Status: DC | PRN

## 2012-09-22 NOTE — Telephone Encounter (Signed)
Returned call and informed pt per instructions by MD/PA.  Pt verbalized understanding and agreed w/ plan.  Pt wanted Rx for Viagra sent to [facility].  Rx sent to pharmacy.

## 2012-09-28 ENCOUNTER — Telehealth: Payer: Self-pay

## 2012-09-28 NOTE — Telephone Encounter (Signed)
PA approved for Cialis through 09/20/2013.

## 2012-10-06 ENCOUNTER — Encounter: Payer: Self-pay | Admitting: *Deleted

## 2012-10-12 ENCOUNTER — Ambulatory Visit (INDEPENDENT_AMBULATORY_CARE_PROVIDER_SITE_OTHER): Payer: Medicare Other | Admitting: Cardiovascular Disease

## 2012-10-12 ENCOUNTER — Encounter: Payer: Self-pay | Admitting: Cardiovascular Disease

## 2012-10-12 VITALS — BP 158/66 | HR 70 | Ht 68.0 in | Wt 186.6 lb

## 2012-10-12 DIAGNOSIS — N400 Enlarged prostate without lower urinary tract symptoms: Secondary | ICD-10-CM

## 2012-10-12 DIAGNOSIS — I25810 Atherosclerosis of coronary artery bypass graft(s) without angina pectoris: Secondary | ICD-10-CM

## 2012-10-12 DIAGNOSIS — I2510 Atherosclerotic heart disease of native coronary artery without angina pectoris: Secondary | ICD-10-CM

## 2012-10-12 DIAGNOSIS — E785 Hyperlipidemia, unspecified: Secondary | ICD-10-CM

## 2012-10-12 DIAGNOSIS — N529 Male erectile dysfunction, unspecified: Secondary | ICD-10-CM | POA: Diagnosis not present

## 2012-10-12 DIAGNOSIS — I441 Atrioventricular block, second degree: Secondary | ICD-10-CM

## 2012-10-12 DIAGNOSIS — I739 Peripheral vascular disease, unspecified: Secondary | ICD-10-CM

## 2012-10-12 MED ORDER — HYDROCHLOROTHIAZIDE 12.5 MG PO CAPS: 12.5000 mg | ORAL_CAPSULE | Freq: Every day | ORAL | Status: DC

## 2012-10-12 NOTE — Patient Instructions (Addendum)
Your physician recommends that you schedule a follow-up appointment in: 3 months.  

## 2012-10-14 ENCOUNTER — Telehealth: Payer: Self-pay | Admitting: Cardiovascular Disease

## 2012-10-14 NOTE — Telephone Encounter (Signed)
The patient called in with questions on the dosage/strength of Cialis that Dr. Royann Shivers wrote for him.  There seems to be some confusion on the dosage and would like Dr. Royann Shivers to call him.

## 2012-10-17 NOTE — Assessment & Plan Note (Signed)
Lipid profile has not yet been performed Will order.

## 2012-10-17 NOTE — Assessment & Plan Note (Signed)
This may have been responsible for his occasional episodes of dizziness. I would avoid use of beta blockers or other agents that might adversely affect AV conduction. His blood pressure is a little bit high after discontinuing the metoprolol but I believe there has also been some confusion regarding the need to continue taking hydrochlorothiazide. We will reassess his blood pressure at his next office appointment before recommending changes.

## 2012-10-17 NOTE — Assessment & Plan Note (Signed)
90% left renal artery stenosis, asymptomatic, normal blood pressure. Mild carotid plaque without obstruction and without history of stroke or TIA.

## 2012-10-17 NOTE — Assessment & Plan Note (Signed)
Asymptomatic, normal left ventricular systolic function. Again reviewed the critical importance of avoiding combination of nitrates and Cialis.

## 2012-10-17 NOTE — Progress Notes (Signed)
Patient ID: [name], male   DOB: March 24, 1924, 79 y.o.   MRN: [id number]     Reason for office visit CAD s/p CABG, PAD, conduction system disease  [name] had recently been complaining of dizziness. After weaning off beta blockers he has not had any further dizzy spells. He denies chest pain or shortness of breath. His tremor did not get much worse after he stopped the beta blocker.  We had recommended Cialis has had symptoms of bladder obstruction from an enlarged prostate as well as erectile dysfunction. The Cialis was initially denied and he was sent to Viagra. The Viagra didn't help either problem. He has not received communication from his insurance company that he has been approved for Cialis. We are waiting for him to receive this medication.  He remains active and goes to the gym on a regular basis. She looks considerably younger than his stated age.   No Known Allergies  Current Outpatient Prescriptions  Medication Sig Dispense Refill  . ALPRAZolam (XANAX) 0.25 MG tablet Take 1 tablet (0.25 mg total) by mouth 3 (three) times daily as needed for sleep.  30 tablet  2  . amLODipine (NORVASC) 5 MG tablet Take 1 tablet (5 mg total) by mouth daily.  30 tablet  11  . docusate sodium (COLACE) 100 MG capsule Take 1 capsule (100 mg total) by mouth every 12 (twelve) hours.  60 capsule  0  . hydrochlorothiazide (MICROZIDE) 12.5 MG capsule Take 1 capsule (12.5 mg total) by mouth daily.  30 capsule  11  . OVER THE COUNTER MEDICATION daily. Argi-Vive III dietary supplement      . OVER THE COUNTER MEDICATION daily. T-Boost caplets      . pramoxine (PROCTOFOAM) 1 % foam Place rectally every 2 (two) hours as needed for hemorrhoids.  15 g  0  . Prasterone (DEHYDROEPIANDROSTERONE) CRYS by Does not apply route.      . sildenafil (VIAGRA) 50 MG tablet Take 1 tablet (50 mg total) by mouth daily as needed for erectile dysfunction.  30 tablet  6   No current facility-administered medications  for this visit.    Past Medical History  Diagnosis Date  . CAD (coronary artery disease)   . Essential and other specified forms of tremor 07/15/2012  . RBBB   . Mobitz type 2 second degree atrioventricular block     Past Surgical History  Procedure Laterality Date  . Knee surgery Right 2006  . Coronary artery bypass graft  05/10/2007    LIMA to LAD, SVG to 1st and 2nd OM and SVG to RCA  . Cardiac catheterization  04/27/2007    3 vessel disease  . Coronary angioplasty  1988  . Coronary angioplasty with stent placement  1998    LAD  . US echocardiography  10/01/2010    mild concentric LVH,LA mildly dilated,RA mild to mod. dilated,mild MR,mild to mod. TR,mild AI,mild PI,sinus rhythm w/pauses  . Nm myocar perf wall motion  04/20/2007    mod. reversible ischemia    Family History  Problem Relation Age of Onset  . Pneumonia Mother   . Cancer Father   . Cancer Brother     History   Social History  . Marital Status: Married    Spouse Name: N/A    Number of Children: N/A  . Years of Education: N/A   Occupational History  . Not on file.   Social History Main Topics  . Smoking status: Never Smoker   .  Smokeless tobacco: Not on file  . Alcohol Use: No  . Drug Use: No  . Sexual Activity: Not on file   Other Topics Concern  . Not on file   Social History Narrative  . No narrative on file    Review of systems: The patient specifically denies any chest pain at rest or with exertion, dyspnea at rest or with exertion, orthopnea, paroxysmal nocturnal dyspnea, syncope, palpitations, focal neurological deficits, intermittent claudication, lower extremity edema, unexplained weight gain, cough, hemoptysis or wheezing.  The patient also denies abdominal pain, nausea, vomiting, dysphagia, diarrhea, constipation, polyuria, polydipsia, abnormal bleeding or bruising, fever, chills, unexpected weight changes, mood swings, change in skin or hair texture, change in voice quality, auditory or  visual problems, allergic reactions or rashes, new musculoskeletal complaints other than usual "aches and pains".  He has erectile dysfunction and dysuria, hematuria, frequency, urgency. He has seen [name] in urology consultation in the past. l    PHYSICAL EXAM BP 158/66  Pulse 70  Ht 5\' 8"  (1.727 m)  Wt 186 lb 9.6 oz (84.641 kg)  BMI 28.38 kg/m2  PHYSICAL EXAM  BP 140/66  Pulse 70  Ht 5\' 8"  (1.727 m)  Wt 186 lb (84.369 kg)  BMI 28.29 kg/m2 General: Alert, oriented x3, no distress  Head: no evidence of trauma, PERRL, EOMI, no exophtalmos or lid lag, no myxedema, no xanthelasma; normal ears, nose and oropharynx  Neck: normal jugular venous pulsations and no hepatojugular reflux; brisk carotid pulses without delay and no carotid bruits  Chest: clear to auscultation, no signs of consolidation by percussion or palpation, normal fremitus, symmetrical and full respiratory excursions  Cardiovascular: normal position and quality of the apical impulse, regular rhythm with occasional irregularity, normal first and widely split second heart sounds, no murmurs, rubs or gallops  Abdomen: no tenderness or distention, no masses by palpation, no abnormal pulsatility or arterial bruits, normal bowel sounds, no hepatosplenomegaly  Extremities: no clubbing, cyanosis or edema; 2+ radial, ulnar and brachial pulses bilaterally; 2+ right femoral, posterior tibial and dorsalis pedis pulses; 2+ left femoral, posterior tibial and dorsalis pedis pulses; no subclavian or femoral bruits  Neurological: grossly nonfoca  EKG: Sinus rhythm, a single PAC with aberrancy, pre-existing right bundle branch block and left axis deviation  BMET    Component Value Date/Time   NA 143 07/23/2012 1310   K 3.7 07/23/2012 1310   CL 109 07/23/2012 1310   CO2 26 07/23/2012 1310   GLUCOSE 93 07/23/2012 1310   BUN 18 07/23/2012 1310   CREATININE 1.20 07/23/2012 1310   CALCIUM 9.8 07/23/2012 1310   GFRNONAA 53* 07/23/2012 1310   GFRAA 62*  07/23/2012 1310     ASSESSMENT AND PLAN Erectile dysfunction Hopefully once she gets the Cialis we will see whether this helps both his urinary difficulties and his erectile dysfunction  CAD (coronary artery disease)s/p CABG (LIMA to LAD, SVG sequential to first and second OM, SVG to distal RCA) 2009 Asymptomatic, normal left ventricular systolic function. Again reviewed the critical importance of avoiding combination of nitrates and Cialis.  Prostatism Followup with Dr. Brunilda Payor if symptoms not improved with Cialis  PAD (peripheral artery disease) 90% left renal artery stenosis, asymptomatic, normal blood pressure. Mild carotid plaque without obstruction and without history of stroke or TIA.  Dyslipidemia Lipid profile has not yet been performed Will order.  Second degree AV block, Mobitz type I This may have been responsible for his occasional episodes of dizziness. I would avoid use of  beta blockers or other agents that might adversely affect AV conduction. His blood pressure is a little bit high after discontinuing the metoprolol but I believe there has also been some confusion regarding the need to continue taking hydrochlorothiazide. We will reassess his blood pressure at his next office appointment before recommending changes.   Orders Placed This Encounter  Procedures  . EKG 12-Lead   Meds ordered this encounter  Medications  . hydrochlorothiazide (MICROZIDE) 12.5 MG capsule    Sig: Take 1 capsule (12.5 mg total) by mouth daily.    Dispense:  30 capsule    Refill:  11    Elizabeht Suto  Daquane Fair, MD, [facility] [phone] office [phone] pager

## 2012-10-17 NOTE — Assessment & Plan Note (Signed)
Followup with Dr. Brunilda Payor if symptoms not improved with Cialis

## 2012-10-17 NOTE — Assessment & Plan Note (Signed)
Hopefully once she gets the Cialis we will see whether this helps both his urinary difficulties and his erectile dysfunction

## 2012-10-18 ENCOUNTER — Telehealth: Payer: Self-pay | Admitting: Cardiovascular Disease

## 2012-10-18 NOTE — Telephone Encounter (Signed)
Pt is calling about his prescription. Needs clarification

## 2012-10-19 ENCOUNTER — Telehealth: Payer: Self-pay | Admitting: *Deleted

## 2012-10-19 DIAGNOSIS — E782 Mixed hyperlipidemia: Secondary | ICD-10-CM

## 2012-10-19 DIAGNOSIS — Z79899 Other long term (current) drug therapy: Secondary | ICD-10-CM

## 2012-10-19 MED ORDER — TADALAFIL 2.5 MG PO TABS: 2.5000 mg | ORAL_TABLET | Freq: Every day | ORAL | Status: DC

## 2012-10-19 NOTE — Telephone Encounter (Signed)
Returned call.  Pt stated Cialis is covered through mail order pharmacy and would like Viagra switched to 90-day supply of Cialis.  Send to [facility].  Pt stated he is supposed to take Cialis 2.5 mg daily, not as needed.  Pt informed Dr. Salena Saner will be notified for further instructions.  Pt verbalized understanding and agreed w/ plan.  Message forwarded to Dr. Ellwood Handler, CMA.

## 2012-10-19 NOTE — Telephone Encounter (Signed)
Lab request mailed to patient to have drawn prior to next visit.

## 2012-10-19 NOTE — Telephone Encounter (Signed)
Returned call and informed pt per instructions by MD/PA.  Pt verbalized understanding and agreed w/ plan.  Rx sent to pharmacy for 90-day supply per pt request.

## 2012-10-19 NOTE — Telephone Encounter (Signed)
I thought we had finally had this issue straightened out . He is supposed to be on Cialis 2.5 mg daily for symptoms of BPH

## 2012-10-19 NOTE — Telephone Encounter (Signed)
Message copied by Vita Barley on Wed Oct 19, 2012  1:50 PM ------      Message from: Deitrich Fair      Created: Mon Oct 17, 2012  3:49 PM       Needs a lipid profile before his next appointment in about 3 months ------

## 2012-11-21 DIAGNOSIS — I4510 Unspecified right bundle-branch block: Secondary | ICD-10-CM | POA: Diagnosis not present

## 2012-11-21 DIAGNOSIS — E7800 Pure hypercholesterolemia, unspecified: Secondary | ICD-10-CM | POA: Diagnosis not present

## 2012-11-21 DIAGNOSIS — Z006 Encounter for examination for normal comparison and control in clinical research program: Secondary | ICD-10-CM | POA: Diagnosis not present

## 2012-11-21 DIAGNOSIS — I25810 Atherosclerosis of coronary artery bypass graft(s) without angina pectoris: Secondary | ICD-10-CM | POA: Diagnosis not present

## 2012-11-21 DIAGNOSIS — I119 Hypertensive heart disease without heart failure: Secondary | ICD-10-CM | POA: Diagnosis not present

## 2012-11-21 DIAGNOSIS — G250 Essential tremor: Secondary | ICD-10-CM | POA: Diagnosis not present

## 2012-11-21 DIAGNOSIS — Z23 Encounter for immunization: Secondary | ICD-10-CM | POA: Diagnosis not present

## 2013-01-02 ENCOUNTER — Telehealth: Payer: Self-pay | Admitting: Cardiovascular Disease

## 2013-01-02 DIAGNOSIS — Z79899 Other long term (current) drug therapy: Secondary | ICD-10-CM | POA: Diagnosis not present

## 2013-01-02 DIAGNOSIS — E782 Mixed hyperlipidemia: Secondary | ICD-10-CM | POA: Diagnosis not present

## 2013-01-02 NOTE — Telephone Encounter (Signed)
Needs lab order mailed to him prior to office visit on 01/16/13.

## 2013-01-02 NOTE — Telephone Encounter (Signed)
Returned call and pt verified x 2.  Pt stated he wanted to get his labs done, but never got the order.  Pt informed he can still go to the lab to have labs drawn w/o order.  Informed he should be fasting when he goes.  Pt verbalized understanding and agreed w/ plan.

## 2013-01-03 ENCOUNTER — Telehealth: Payer: Self-pay | Admitting: *Deleted

## 2013-01-03 LAB — LIPID PANEL
Cholesterol: 244 mg/dL — ABNORMAL HIGH (ref 0–200)
Triglycerides: 100 mg/dL (ref <150)
VLDL: 20 mg/dL (ref 0–40)

## 2013-01-03 LAB — COMPREHENSIVE METABOLIC PANEL
ALT: 10 U/L (ref 0–53)
Alkaline Phosphatase: 49 U/L (ref 39–117)
Sodium: 142 mEq/L (ref 135–145)
Total Bilirubin: 0.8 mg/dL (ref 0.3–1.2)
Total Protein: 7.8 g/dL (ref 6.0–8.3)

## 2013-01-03 MED ORDER — ATORVASTATIN CALCIUM 40 MG PO TABS: 40.0000 mg | ORAL_TABLET | Freq: Every day | ORAL | Status: DC

## 2013-01-03 NOTE — Telephone Encounter (Signed)
Lab results called to patient.  He has been on Atorvastatin in the past but he ran out.  Per Dr. Salena Saner start Atorvastatin 40mg  QD.  Rx sent to [facility] on [address].  Patient voiced understanding.

## 2013-01-09 ENCOUNTER — Encounter: Payer: Self-pay | Admitting: Cardiovascular Disease

## 2013-01-16 ENCOUNTER — Ambulatory Visit (INDEPENDENT_AMBULATORY_CARE_PROVIDER_SITE_OTHER): Payer: Medicare Other | Admitting: Cardiovascular Disease

## 2013-01-16 ENCOUNTER — Encounter: Payer: Self-pay | Admitting: Cardiovascular Disease

## 2013-01-16 VITALS — BP 140/78 | HR 56 | Ht 68.0 in | Wt 188.9 lb

## 2013-01-16 DIAGNOSIS — I441 Atrioventricular block, second degree: Secondary | ICD-10-CM | POA: Diagnosis not present

## 2013-01-16 DIAGNOSIS — N529 Male erectile dysfunction, unspecified: Secondary | ICD-10-CM

## 2013-01-16 DIAGNOSIS — I2510 Atherosclerotic heart disease of native coronary artery without angina pectoris: Secondary | ICD-10-CM | POA: Diagnosis not present

## 2013-01-16 DIAGNOSIS — I10 Essential (primary) hypertension: Secondary | ICD-10-CM | POA: Diagnosis not present

## 2013-01-16 DIAGNOSIS — E785 Hyperlipidemia, unspecified: Secondary | ICD-10-CM

## 2013-01-16 NOTE — Patient Instructions (Signed)
Your physician recommends that you schedule a follow-up appointment in: 6 months  

## 2013-01-23 ENCOUNTER — Other Ambulatory Visit: Payer: Self-pay | Admitting: Neurology

## 2013-01-24 NOTE — Telephone Encounter (Signed)
Rx faxed to [facility] at [phone].

## 2013-01-25 ENCOUNTER — Ambulatory Visit: Payer: Medicare Other | Admitting: Neurology

## 2013-01-30 ENCOUNTER — Encounter: Payer: Self-pay | Admitting: Cardiovascular Disease

## 2013-01-30 DIAGNOSIS — I10 Essential (primary) hypertension: Secondary | ICD-10-CM

## 2013-01-30 HISTORY — DX: Essential (primary) hypertension: I10

## 2013-01-30 NOTE — Assessment & Plan Note (Signed)
He has no angina. He has normal left ventricular systolic function. He is reminded that he should be immediately alert any health care provider that he is taking Cialis so that he will not be administered nitrates and cause a potentially dangerous drug interaction.

## 2013-01-30 NOTE — Assessment & Plan Note (Signed)
Although Cialis has not really helped this problem, it has had a dramatic benefit as far as his symptoms of prostatism and significantly reduced his nocturia

## 2013-01-30 NOTE — Progress Notes (Signed)
Patient ID: [name], male   DOB: February 09, 1925, 79 y.o.   MRN: [id number]     Reason for office visit Hypertension, CAD status post CABG, second degree AV block  [name] is feeling better. After starting treatment with Cialis he is sleeping through the night secondary to him marked reduction nocturia. The medication has not helped erectile dysfunction.  We recently stopped his beta blockers instead evidence of second degree AV block and also has a right bundle branch block. He has had only one episode of dizziness over the last month. This lasted for a couple of minutes. At this point he does not appear that it is yet necessary to place a pacemaker.  He denies angina or shortness of breath. He rarely has some ankle swelling.  No Known Allergies  Current Outpatient Prescriptions  Medication Sig Dispense Refill  . amLODipine (NORVASC) 5 MG tablet Take 1 tablet (5 mg total) by mouth daily.  30 tablet  11  . aspirin 81 MG tablet Take 81 mg by mouth daily.      Marland Kitchen atorvastatin (LIPITOR) 40 MG tablet Take 1 tablet (40 mg total) by mouth daily.  30 tablet  5  . hydrochlorothiazide (MICROZIDE) 12.5 MG capsule Take 1 capsule (12.5 mg total) by mouth daily.  30 capsule  11  . OVER THE COUNTER MEDICATION daily. Argi-Vive III dietary supplement      . pramoxine (PROCTOFOAM) 1 % foam Place rectally every 2 (two) hours as needed for hemorrhoids.  15 g  0  . Tadalafil (CIALIS) 2.5 MG TABS Take 1 tablet (2.5 mg total) by mouth daily.  90 each  3  . ALPRAZolam (XANAX) 0.25 MG tablet Take 1 tablet (0.25 mg total) by mouth 3 (three) times daily as needed (tremor).  30 tablet  3   No current facility-administered medications for this visit.    Past Medical History  Diagnosis Date  . CAD (coronary artery disease)   . Essential and other specified forms of tremor 07/15/2012  . RBBB   . Mobitz type 2 second degree atrioventricular block   . HTN (hypertension) 01/30/2013    Past Surgical  History  Procedure Laterality Date  . Knee surgery Right 2006  . Coronary artery bypass graft  05/10/2007    LIMA to LAD, SVG to 1st and 2nd OM and SVG to RCA  . Cardiac catheterization  04/27/2007    3 vessel disease  . Coronary angioplasty  1988  . Coronary angioplasty with stent placement  1998    LAD  . US echocardiography  10/01/2010    mild concentric LVH,LA mildly dilated,RA mild to mod. dilated,mild MR,mild to mod. TR,mild AI,mild PI,sinus rhythm w/pauses  . Nm myocar perf wall motion  04/20/2007    mod. reversible ischemia    Family History  Problem Relation Age of Onset  . Pneumonia Mother   . Cancer Father   . Cancer Brother     History   Social History  . Marital Status: Married    Spouse Name: N/A    Number of Children: N/A  . Years of Education: N/A   Occupational History  . Not on file.   Social History Main Topics  . Smoking status: Never Smoker   . Smokeless tobacco: Not on file  . Alcohol Use: No  . Drug Use: No  . Sexual Activity: Not on file   Other Topics Concern  . Not on file   Social History Narrative  . No  narrative on file    Review of systems: The patient specifically denies any chest pain at rest or with exertion, dyspnea at rest or with exertion, orthopnea, paroxysmal nocturnal dyspnea, syncope, palpitations, focal neurological deficits, intermittent claudication, unexplained weight gain, cough, hemoptysis or wheezing.  The patient also denies abdominal pain, nausea, vomiting, dysphagia, diarrhea, constipation, polyuria, polydipsia, dysuria, hematuria, frequency, urgency, abnormal bleeding or bruising, fever, chills, unexpected weight changes, mood swings, change in skin or hair texture, change in voice quality, auditory or visual problems, allergic reactions or rashes, new musculoskeletal complaints other than usual "aches and pains".  PHYSICAL EXAM BP 140/78  Pulse 56  Ht 5\' 8"  (1.727 m)  Wt 188 lb 14.4 oz (85.684 kg)  BMI 28.73  kg/m2 General: Alert, oriented x3, no distress  Head: no evidence of trauma, PERRL, EOMI, no exophtalmos or lid lag, no myxedema, no xanthelasma; normal ears, nose and oropharynx  Neck: normal jugular venous pulsations and no hepatojugular reflux; brisk carotid pulses without delay and no carotid bruits  Chest: clear to auscultation, no signs of consolidation by percussion or palpation, normal fremitus, symmetrical and full respiratory excursions  Cardiovascular: normal position and quality of the apical impulse, regular rhythm with occasional irregularity, normal first and widely split second heart sounds, no murmurs, rubs or gallops  Abdomen: no tenderness or distention, no masses by palpation, no abnormal pulsatility or arterial bruits, normal bowel sounds, no hepatosplenomegaly  Extremities: no clubbing, cyanosis or edema; 2+ radial, ulnar and brachial pulses bilaterally; 2+ right femoral, posterior tibial and dorsalis pedis pulses; 2+ left femoral, posterior tibial and dorsalis pedis pulses; no subclavian or femoral bruits  Neurological: grossly nonfocal    Lipid Panel     Component Value Date/Time   CHOL 244* 01/02/2013 1557   TRIG 100 01/02/2013 1557   HDL 74 01/02/2013 1557   CHOLHDL 3.3 01/02/2013 1557   VLDL 20 01/02/2013 1557   LDLCALC 150* 01/02/2013 1557    BMET    Component Value Date/Time   NA 142 01/02/2013 1557   K 5.0 01/02/2013 1557   CL 103 01/02/2013 1557   CO2 28 01/02/2013 1557   GLUCOSE 94 01/02/2013 1557   BUN 28* 01/02/2013 1557   CREATININE 1.33 01/02/2013 1557   CREATININE 1.20 07/23/2012 1310   CALCIUM 10.5 01/02/2013 1557   GFRNONAA 53* 07/23/2012 1310   GFRAA 62* 07/23/2012 1310     ASSESSMENT AND PLAN Erectile dysfunction Although Cialis has not really helped this problem, it has had a dramatic benefit as far as his symptoms of prostatism and significantly reduced his nocturia  CAD (coronary artery disease)s/p CABG (LIMA to LAD, SVG sequential to  first and second OM, SVG to distal RCA) 2009 He has no angina. He has normal left ventricular systolic function. He is reminded that he should be immediately alert any health care provider that he is taking Cialis so that he will not be administered nitrates and cause a potentially dangerous drug interaction.  Second degree AV block, Mobitz type I Avoid beta blockers, centrally acting calcium channel blockers and any other negative chronotropic agents  Dyslipidemia I'm not sure why he was not taking his atorvastatin. His lipid profile reflects the absence of statin therapy with an acceptably high LDL cholesterol level. We'll resume atorvastatin.    Patient Instructions  Your physician recommends that you schedule a follow-up appointment in: 6 months    Meds ordered this encounter  Medications  . aspirin 81 MG tablet    Sig:  Take 81 mg by mouth daily.    Junious Silk, MD, [facility] [phone] office [phone] pager

## 2013-01-30 NOTE — Assessment & Plan Note (Signed)
I'm not sure why he was not taking his atorvastatin. His lipid profile reflects the absence of statin therapy with an acceptably high LDL cholesterol level. We'll resume atorvastatin.

## 2013-01-30 NOTE — Assessment & Plan Note (Signed)
Avoid beta blockers, centrally acting calcium channel blockers and any other negative chronotropic agents

## 2013-02-27 ENCOUNTER — Other Ambulatory Visit: Payer: Self-pay | Admitting: *Deleted

## 2013-02-27 ENCOUNTER — Encounter: Payer: Self-pay | Admitting: *Deleted

## 2013-02-27 NOTE — Telephone Encounter (Signed)
Medication refill for cialis 5mg  refused - 2.5mg  tabs refilled #90 with 3 refills

## 2013-03-03 ENCOUNTER — Telehealth: Payer: Self-pay | Admitting: Cardiovascular Disease

## 2013-03-03 NOTE — Telephone Encounter (Signed)
Returned call and pt verified x 2.  Pt stated at his last visit Dr. Loletha Grayer told him he could take 5 mg of Cialis.  Stated he is out now and [facility] was supposed to contact the office requesting it.  Pt informed refill request was received on Monday and it was denied r/t it being requested too soon and for 5 mg.  Pt informed last OV note does not indicate that Cialis dose was increased and Dr. Loletha Grayer is out of the office today.  Informed Dr. Loletha Grayer will be notified and if approved Rx will be sent.  Pt verbalized understanding and agreed w/ plan.  Pt understands he may not receive another call back before Tuesday, next time Dr. Loletha Grayer is physically in the office.  Pt would like script sent to [facility].  Message forwarded to Dr. Sallyanne Kuster.

## 2013-03-03 NOTE — Telephone Encounter (Signed)
Need a prescription he thinks for his medicine called to a local pharmacy. Pt is not sure this is what the mail order pharmacy is saying,please call.

## 2013-03-06 MED ORDER — TADALAFIL 5 MG PO TABS: 5.0000 mg | ORAL_TABLET | Freq: Every day | ORAL | Status: DC

## 2013-03-06 NOTE — Telephone Encounter (Signed)
Please send in Rx for the 5mg  dose (once daily) [facility]

## 2013-03-06 NOTE — Telephone Encounter (Signed)
Returned call and informed pt per instructions by MD/PA.  Pt verbalized understanding and agreed w/ plan.  14-day supply sent to [facility] and 90-day supply to [facility] per pt request.

## 2013-04-12 DIAGNOSIS — Z86010 Personal history of colonic polyps: Secondary | ICD-10-CM | POA: Diagnosis not present

## 2013-04-12 DIAGNOSIS — K921 Melena: Secondary | ICD-10-CM | POA: Diagnosis not present

## 2013-05-02 DIAGNOSIS — Z8551 Personal history of malignant neoplasm of bladder: Secondary | ICD-10-CM | POA: Diagnosis not present

## 2013-06-27 DIAGNOSIS — Z79899 Other long term (current) drug therapy: Secondary | ICD-10-CM | POA: Diagnosis not present

## 2013-06-27 DIAGNOSIS — E7800 Pure hypercholesterolemia, unspecified: Secondary | ICD-10-CM | POA: Diagnosis not present

## 2013-06-27 DIAGNOSIS — I119 Hypertensive heart disease without heart failure: Secondary | ICD-10-CM | POA: Diagnosis not present

## 2013-06-27 DIAGNOSIS — I25810 Atherosclerosis of coronary artery bypass graft(s) without angina pectoris: Secondary | ICD-10-CM | POA: Diagnosis not present

## 2013-06-27 DIAGNOSIS — I4510 Unspecified right bundle-branch block: Secondary | ICD-10-CM | POA: Diagnosis not present

## 2013-07-17 ENCOUNTER — Ambulatory Visit (INDEPENDENT_AMBULATORY_CARE_PROVIDER_SITE_OTHER): Payer: Medicare Other | Admitting: Cardiovascular Disease

## 2013-07-17 ENCOUNTER — Encounter: Payer: Self-pay | Admitting: Cardiovascular Disease

## 2013-07-17 VITALS — BP 150/70 | HR 77 | Resp 16 | Ht 66.0 in | Wt 183.3 lb

## 2013-07-17 DIAGNOSIS — I441 Atrioventricular block, second degree: Secondary | ICD-10-CM

## 2013-07-17 DIAGNOSIS — E785 Hyperlipidemia, unspecified: Secondary | ICD-10-CM

## 2013-07-17 DIAGNOSIS — E782 Mixed hyperlipidemia: Secondary | ICD-10-CM

## 2013-07-17 DIAGNOSIS — Z79899 Other long term (current) drug therapy: Secondary | ICD-10-CM

## 2013-07-17 DIAGNOSIS — I2510 Atherosclerotic heart disease of native coronary artery without angina pectoris: Secondary | ICD-10-CM | POA: Diagnosis not present

## 2013-07-17 DIAGNOSIS — I4510 Unspecified right bundle-branch block: Secondary | ICD-10-CM

## 2013-07-17 DIAGNOSIS — I10 Essential (primary) hypertension: Secondary | ICD-10-CM

## 2013-07-17 NOTE — Assessment & Plan Note (Signed)
Slight elevation today, usually well controlled. Note history of left renal artery stenosis. If elevated blood pressure worsens, reevaluate for need for revascularization.

## 2013-07-17 NOTE — Patient Instructions (Signed)
Your physician recommends that you return for lab work. This needs to be done when FASTING, so nothing to eat or drink after midnight.  Your physician recommends that you schedule a follow-up appointment in: [place] with Dr.Croitoru

## 2013-07-17 NOTE — Assessment & Plan Note (Signed)
Asymptomatic, encouraged return to regular physical exercise

## 2013-07-17 NOTE — Progress Notes (Signed)
Patient ID: [name], male   DOB: 01-14-1925, 80 y.o.   MRN: [id number]      Reason for office visit CAD, AV node conduction disease, HTN, dyslipidemia  [name] is an 80 year old gentleman who is now roughly 6 years status post multivessel CABG (LIMA to LAD, sequential SVG to first OM and second OM, SVG to distal RCA), peripheral arterial disease (asymptomatic 90% left renal artery stenosis, mild carotid plaque without history of stroke/TIA), hypertension and hyperlipidemia. He takes phosphodiesterase inhibitors for problems with erectile dysfunction and prostatism, with remarkably positive impact on the latter. He had second-degree, Mobitz type I AV block while taking beta blockers.  He has not had any problems since his last appointment. He denies chest pain and dyspnea on exertion, but admits to being less faithful to regular exercise as he should be. His blood pressure is borderline elevated on presentation today but recently has been consistently normal, including when checked in his primary care physician's office. He has occasional mild swelling in his ankles. The swelling invariably resolves by the morning. He had some hemorrhoidal bleeding that has resolved. He is not troubled by palpitations, dizziness or syncope.    No Known Allergies  Current Outpatient Prescriptions  Medication Sig Dispense Refill  . ALPRAZolam (XANAX) 0.25 MG tablet Take 1 tablet (0.25 mg total) by mouth 3 (three) times daily as needed (tremor).  30 tablet  3  . amLODipine (NORVASC) 5 MG tablet Take 1 tablet (5 mg total) by mouth daily.  30 tablet  11  . aspirin 81 MG tablet Take 81 mg by mouth daily.      Marland Kitchen atorvastatin (LIPITOR) 40 MG tablet Take 1 tablet (40 mg total) by mouth daily.  30 tablet  5  . hydrochlorothiazide (MICROZIDE) 12.5 MG capsule Take 1 capsule (12.5 mg total) by mouth daily.  30 capsule  11  . OVER THE COUNTER MEDICATION Take by mouth daily. Argi-Vive III dietary supplement        . pramoxine (PROCTOFOAM) 1 % foam Place rectally every 2 (two) hours as needed for hemorrhoids.  15 g  0  . tadalafil (CIALIS) 5 MG tablet Take 1 tablet (5 mg total) by mouth daily.  90 tablet  3   No current facility-administered medications for this visit.    Past Medical History  Diagnosis Date  . CAD (coronary artery disease)   . Essential and other specified forms of tremor 07/15/2012  . RBBB   . Mobitz type 2 second degree atrioventricular block   . HTN (hypertension) 01/30/2013    Past Surgical History  Procedure Laterality Date  . Knee surgery Right 2006  . Coronary artery bypass graft  05/10/2007    LIMA to LAD, SVG to 1st and 2nd OM and SVG to RCA  . Cardiac catheterization  04/27/2007    3 vessel disease  . Coronary angioplasty  1988  . Coronary angioplasty with stent placement  1998    LAD  . US echocardiography  10/01/2010    mild concentric LVH,LA mildly dilated,RA mild to mod. dilated,mild MR,mild to mod. TR,mild AI,mild PI,sinus rhythm w/pauses  . Nm myocar perf wall motion  04/20/2007    mod. reversible ischemia    Family History  Problem Relation Age of Onset  . Pneumonia Mother   . Cancer Father   . Cancer Brother     History   Social History  . Marital Status: Married    Spouse Name: N/A    Number  of Children: N/A  . Years of Education: N/A   Occupational History  . Not on file.   Social History Main Topics  . Smoking status: Never Smoker   . Smokeless tobacco: Not on file  . Alcohol Use: No  . Drug Use: No  . Sexual Activity: Not on file   Other Topics Concern  . Not on file   Social History Narrative  . No narrative on file    Review of systems: The patient specifically denies any chest pain at rest or with exertion, dyspnea at rest or with exertion, orthopnea, paroxysmal nocturnal dyspnea, syncope, palpitations, focal neurological deficits, intermittent claudication, lower extremity edema, unexplained weight gain, cough, hemoptysis or  wheezing.  The patient also denies abdominal pain, nausea, vomiting, dysphagia, diarrhea, constipation, polyuria, polydipsia, dysuria, hematuria, frequency, urgency, abnormal bleeding or bruising, fever, chills, unexpected weight changes, mood swings, change in skin or hair texture, change in voice quality, auditory or visual problems, allergic reactions or rashes, new musculoskeletal complaints other than usual "aches and pains".   PHYSICAL EXAM BP 150/70  Pulse 77  Resp 16  Ht _0  (1.676 m)  Wt 183 lb 4.8 oz (83.144 kg)  BMI 29.60 kg/m2 General: Alert, oriented x3, no distress  Head: no evidence of trauma, PERRL, EOMI, no exophtalmos or lid lag, no myxedema, no xanthelasma; normal ears, nose and oropharynx  Neck: normal jugular venous pulsations and no hepatojugular reflux; brisk carotid pulses without delay and no carotid bruits  Chest: clear to auscultation, no signs of consolidation by percussion or palpation, normal fremitus, symmetrical and full respiratory excursions  Cardiovascular: normal position and quality of the apical impulse, regular rhythm with occasional irregularity, normal first and widely split second heart sounds, no murmurs, rubs or gallops  Abdomen: no tenderness or distention, no masses by palpation, no abnormal pulsatility or arterial bruits, normal bowel sounds, no hepatosplenomegaly  Extremities: no clubbing, cyanosis or edema; 2+ radial, ulnar and brachial pulses bilaterally; 2+ right femoral, posterior tibial and dorsalis pedis pulses; 2+ left femoral, posterior tibial and dorsalis pedis pulses; no subclavian or femoral bruits  Neurological: grossly nonfocal   EKG: Sinus rhythm with very frequent ectopy, mostly in a pattern of bigeminy. The ectopic beats appear to be PVCs, but could also be aberrantly conducted PACs. The native sinus beats conducted with a right bundle branch block pattern.  Lipid Panel     Component Value Date/Time   CHOL 244* 01/02/2013  1557   TRIG 100 01/02/2013 1557   HDL 74 01/02/2013 1557   CHOLHDL 3.3 01/02/2013 1557   VLDL 20 01/02/2013 1557   LDLCALC 150* 01/02/2013 1557    BMET    Component Value Date/Time   NA 142 01/02/2013 1557   K 5.0 01/02/2013 1557   CL 103 01/02/2013 1557   CO2 28 01/02/2013 1557   GLUCOSE 94 01/02/2013 1557   BUN 28* 01/02/2013 1557   CREATININE 1.33 01/02/2013 1557   CREATININE 1.20 07/23/2012 1310   CALCIUM 10.5 01/02/2013 1557   GFRNONAA 53* 07/23/2012 1310   GFRAA 62* 07/23/2012 1310     ASSESSMENT AND PLAN CAD (coronary artery disease)s/p CABG (LIMA to LAD, SVG sequential to first and second OM, SVG to distal RCA) 2009 Asymptomatic, encouraged return to regular physical exercise  Second degree AV block, Mobitz type I Asymptomatic, avoid beta blockers.  RBBB This suggests that he has both supra and infrahisian AV node disease. A pacemaker would be indicated if he develops symptoms of bradycardia.  HTN (hypertension) Slight elevation today, usually well controlled. Note history of left renal artery stenosis. If elevated blood pressure worsens, reevaluate for need for revascularization.  Dyslipidemia Time to repeat his lipid profile. Last year his lipid profile is unfavorable with an LDL of 150 but he had stopped taking lipid-lowering medications at that time.   Orders Placed This Encounter  Procedures  . Comp Met (CMET)  . Lipid Profile  . EKG 12-Lead   No orders of the defined types were placed in this encounter.    [name]  Sanda Klein, MD, [facility] [phone] office [phone] pager

## 2013-07-17 NOTE — Assessment & Plan Note (Signed)
Time to repeat his lipid profile. Last year his lipid profile is unfavorable with an LDL of 150 but he had stopped taking lipid-lowering medications at that time.

## 2013-07-17 NOTE — Assessment & Plan Note (Signed)
This suggests that he has both supra and infrahisian AV node disease. A pacemaker would be indicated if he develops symptoms of bradycardia.

## 2013-07-17 NOTE — Assessment & Plan Note (Signed)
Asymptomatic, avoid beta blockers.

## 2013-07-27 DIAGNOSIS — E782 Mixed hyperlipidemia: Secondary | ICD-10-CM | POA: Diagnosis not present

## 2013-07-27 DIAGNOSIS — Z79899 Other long term (current) drug therapy: Secondary | ICD-10-CM | POA: Diagnosis not present

## 2013-07-28 ENCOUNTER — Telehealth: Payer: Self-pay | Admitting: *Deleted

## 2013-07-28 LAB — LIPID PANEL
CHOLESTEROL: 156 mg/dL (ref 0–200)
HDL: 66 mg/dL (ref >39)
LDL CALC: 78 mg/dL (ref 0–99)
TRIGLYCERIDES: 60 mg/dL (ref <150)
Total CHOL/HDL Ratio: 2.4 Ratio
VLDL: 12 mg/dL (ref 0–40)

## 2013-07-28 LAB — COMPREHENSIVE METABOLIC PANEL
ALBUMIN: 4.2 g/dL (ref 3.5–5.2)
ALT: 10 U/L (ref 0–53)
AST: 16 U/L (ref 0–37)
Alkaline Phosphatase: 64 U/L (ref 39–117)
BILIRUBIN TOTAL: 0.8 mg/dL (ref 0.2–1.2)
BUN: 21 mg/dL (ref 6–23)
CO2: 27 mEq/L (ref 19–32)
Calcium: 9.8 mg/dL (ref 8.4–10.5)
Chloride: 108 mEq/L (ref 96–112)
Creat: 1.26 mg/dL (ref 0.50–1.35)
Glucose, Bld: 92 mg/dL (ref 70–99)
Potassium: 4.3 mEq/L (ref 3.5–5.3)
SODIUM: 144 meq/L (ref 135–145)
TOTAL PROTEIN: 7.4 g/dL (ref 6.0–8.3)

## 2013-07-28 NOTE — Telephone Encounter (Signed)
Message copied by Tressa Busman on Fri Jul 28, 2013  3:04 PM ------      Message from: Sanda Klein      Created: Fri Jul 28, 2013  9:19 AM       Cholesterol levels are much much better. Liver tests are normal. He needs to remain on statin for the long-term. ------

## 2013-07-28 NOTE — Telephone Encounter (Signed)
Lab results called to patient.  Voiced understanding.  Requested a copy be mailed to his home.

## 2013-11-04 ENCOUNTER — Other Ambulatory Visit: Payer: Self-pay | Admitting: Cardiovascular Disease

## 2013-11-06 NOTE — Telephone Encounter (Signed)
Rx was sent to pharmacy electronically. 

## 2013-12-21 DIAGNOSIS — Z23 Encounter for immunization: Secondary | ICD-10-CM | POA: Diagnosis not present

## 2013-12-21 DIAGNOSIS — I25810 Atherosclerosis of coronary artery bypass graft(s) without angina pectoris: Secondary | ICD-10-CM | POA: Diagnosis not present

## 2013-12-21 DIAGNOSIS — M25539 Pain in unspecified wrist: Secondary | ICD-10-CM | POA: Diagnosis not present

## 2013-12-21 DIAGNOSIS — E780 Pure hypercholesterolemia: Secondary | ICD-10-CM | POA: Diagnosis not present

## 2013-12-21 DIAGNOSIS — Z79899 Other long term (current) drug therapy: Secondary | ICD-10-CM | POA: Diagnosis not present

## 2013-12-22 ENCOUNTER — Ambulatory Visit (HOSPITAL_COMMUNITY)
Admission: RE | Admit: 2013-12-22 | Discharge: 2013-12-22 | Disposition: A | Discharge status: Home / Self Care | Payer: Medicare Other | Source: Ambulatory Visit | Attending: Pulmonary Disease | Admitting: Pulmonary Disease

## 2013-12-22 ENCOUNTER — Other Ambulatory Visit (HOSPITAL_COMMUNITY): Payer: Self-pay | Admitting: Pulmonary Disease

## 2013-12-22 DIAGNOSIS — M79641 Pain in right hand: Secondary | ICD-10-CM | POA: Insufficient documentation

## 2013-12-22 DIAGNOSIS — Q681 Congenital deformity of finger(s) and hand: Secondary | ICD-10-CM | POA: Insufficient documentation

## 2013-12-22 DIAGNOSIS — S6991XA Unspecified injury of right wrist, hand and finger(s), initial encounter: Secondary | ICD-10-CM | POA: Diagnosis not present

## 2013-12-22 DIAGNOSIS — M25532 Pain in left wrist: Secondary | ICD-10-CM

## 2013-12-22 DIAGNOSIS — M19042 Primary osteoarthritis, left hand: Secondary | ICD-10-CM | POA: Diagnosis not present

## 2013-12-22 DIAGNOSIS — M25531 Pain in right wrist: Secondary | ICD-10-CM

## 2013-12-22 DIAGNOSIS — M129 Arthropathy, unspecified: Secondary | ICD-10-CM | POA: Insufficient documentation

## 2013-12-22 DIAGNOSIS — M79642 Pain in left hand: Principal | ICD-10-CM

## 2013-12-22 DIAGNOSIS — S6992XA Unspecified injury of left wrist, hand and finger(s), initial encounter: Secondary | ICD-10-CM | POA: Diagnosis not present

## 2013-12-22 DIAGNOSIS — S3992XA Unspecified injury of lower back, initial encounter: Secondary | ICD-10-CM | POA: Diagnosis not present

## 2013-12-22 DIAGNOSIS — M549 Dorsalgia, unspecified: Secondary | ICD-10-CM

## 2013-12-22 DIAGNOSIS — M545 Low back pain: Secondary | ICD-10-CM | POA: Diagnosis not present

## 2014-02-01 ENCOUNTER — Telehealth: Payer: Self-pay | Admitting: *Deleted

## 2014-02-01 NOTE — Telephone Encounter (Signed)
PA for Cialis faxed with documentation of BPH to [facility].

## 2014-02-12 ENCOUNTER — Telehealth: Payer: Self-pay | Admitting: *Deleted

## 2014-02-12 NOTE — Telephone Encounter (Signed)
Cialis approved by [employer] through [facility] through 02/01/2015.

## 2014-04-17 DIAGNOSIS — F0390 Unspecified dementia without behavioral disturbance: Secondary | ICD-10-CM

## 2014-04-17 HISTORY — DX: Unspecified dementia, unspecified severity, without behavioral disturbance, psychotic disturbance, mood disturbance, and anxiety: F03.90

## 2014-09-12 DIAGNOSIS — H2513 Age-related nuclear cataract, bilateral: Secondary | ICD-10-CM | POA: Diagnosis not present

## 2014-10-25 DIAGNOSIS — N183 Chronic kidney disease, stage 3 (moderate): Secondary | ICD-10-CM | POA: Diagnosis not present

## 2014-10-25 DIAGNOSIS — E780 Pure hypercholesterolemia: Secondary | ICD-10-CM | POA: Diagnosis not present

## 2014-10-25 DIAGNOSIS — I4510 Unspecified right bundle-branch block: Secondary | ICD-10-CM | POA: Diagnosis not present

## 2014-10-25 DIAGNOSIS — M1990 Unspecified osteoarthritis, unspecified site: Secondary | ICD-10-CM | POA: Diagnosis not present

## 2014-10-25 DIAGNOSIS — I25810 Atherosclerosis of coronary artery bypass graft(s) without angina pectoris: Secondary | ICD-10-CM | POA: Diagnosis not present

## 2014-10-25 DIAGNOSIS — Z79899 Other long term (current) drug therapy: Secondary | ICD-10-CM | POA: Diagnosis not present

## 2014-10-25 DIAGNOSIS — I119 Hypertensive heart disease without heart failure: Secondary | ICD-10-CM | POA: Diagnosis not present

## 2014-11-15 DIAGNOSIS — M25562 Pain in left knee: Secondary | ICD-10-CM | POA: Diagnosis not present

## 2014-11-15 DIAGNOSIS — R262 Difficulty in walking, not elsewhere classified: Secondary | ICD-10-CM | POA: Diagnosis not present

## 2014-11-15 DIAGNOSIS — M1712 Unilateral primary osteoarthritis, left knee: Secondary | ICD-10-CM | POA: Diagnosis not present

## 2014-11-22 DIAGNOSIS — R2689 Other abnormalities of gait and mobility: Secondary | ICD-10-CM | POA: Diagnosis not present

## 2014-11-22 DIAGNOSIS — M25562 Pain in left knee: Secondary | ICD-10-CM | POA: Diagnosis not present

## 2014-11-22 DIAGNOSIS — M1712 Unilateral primary osteoarthritis, left knee: Secondary | ICD-10-CM | POA: Diagnosis not present

## 2014-11-22 DIAGNOSIS — R262 Difficulty in walking, not elsewhere classified: Secondary | ICD-10-CM | POA: Diagnosis not present

## 2014-12-03 DIAGNOSIS — M25562 Pain in left knee: Secondary | ICD-10-CM | POA: Diagnosis not present

## 2014-12-03 DIAGNOSIS — R269 Unspecified abnormalities of gait and mobility: Secondary | ICD-10-CM | POA: Diagnosis not present

## 2014-12-03 DIAGNOSIS — M1712 Unilateral primary osteoarthritis, left knee: Secondary | ICD-10-CM | POA: Diagnosis not present

## 2014-12-13 DIAGNOSIS — M25562 Pain in left knee: Secondary | ICD-10-CM | POA: Diagnosis not present

## 2014-12-13 DIAGNOSIS — M1712 Unilateral primary osteoarthritis, left knee: Secondary | ICD-10-CM | POA: Diagnosis not present

## 2014-12-13 DIAGNOSIS — R2689 Other abnormalities of gait and mobility: Secondary | ICD-10-CM | POA: Diagnosis not present

## 2014-12-14 ENCOUNTER — Ambulatory Visit (INDEPENDENT_AMBULATORY_CARE_PROVIDER_SITE_OTHER): Payer: Medicare Other | Admitting: Cardiovascular Disease

## 2014-12-14 ENCOUNTER — Encounter: Payer: Self-pay | Admitting: Cardiovascular Disease

## 2014-12-14 VITALS — BP 138/58 | HR 93 | Resp 16 | Ht 68.0 in | Wt 173.0 lb

## 2014-12-14 DIAGNOSIS — I2510 Atherosclerotic heart disease of native coronary artery without angina pectoris: Secondary | ICD-10-CM | POA: Diagnosis not present

## 2014-12-14 DIAGNOSIS — E785 Hyperlipidemia, unspecified: Secondary | ICD-10-CM

## 2014-12-14 DIAGNOSIS — I10 Essential (primary) hypertension: Secondary | ICD-10-CM

## 2014-12-14 DIAGNOSIS — I739 Peripheral vascular disease, unspecified: Secondary | ICD-10-CM

## 2014-12-14 DIAGNOSIS — I441 Atrioventricular block, second degree: Secondary | ICD-10-CM | POA: Diagnosis not present

## 2014-12-14 DIAGNOSIS — Z79899 Other long term (current) drug therapy: Secondary | ICD-10-CM

## 2014-12-14 NOTE — Patient Instructions (Signed)
Your physician recommends that you return for lab work in: FASTING AT [facility] AT YOUR CONVENIENCE  Dr. Croitoru recommends that you schedule a follow-up appointment in: ONE YEAR    

## 2014-12-15 ENCOUNTER — Encounter: Payer: Self-pay | Admitting: Cardiovascular Disease

## 2014-12-15 NOTE — Progress Notes (Signed)
Patient ID: [name], male   DOB: Jul 27, 1924, 81 y.o.   MRN: [id number]     Cardiology Office Note   Date:  12/15/2014   ID:  [name], DOB Oct 01, 1924, MRN [id number]  PCP:  Leola Brazil, MD  Cardiologist:   Sanda Klein, MD   No chief complaint on file.     History of Present Illness: [name] is a 81 y.o. male who presents for  Coronary artery disease s/p CABG, renal artery stenosis, history of 2nd degree AV block, HTN, hyperlipidemia  He is now roughly 8 years status post multivessel CABG (LIMA to LAD, sequential SVG to first OM and second OM, SVG to distal RCA), peripheral arterial disease (asymptomatic 90% left renal artery stenosis, mild carotid plaque without history of stroke/TIA), hypertension and hyperlipidemia. He takes phosphodiesterase inhibitors for problems with erectile dysfunction and prostatism He initially had positive impact on the latter, but has now had recurrence of nocturia, frequency and hesitancy. He had second-degree, Mobitz type I AV block while taking beta blockers.  He denies syncope, angina and dyspnea. He has bilateral knee pain and is receiving physical therapy at [facility]   He and his wife, Lorna Dibble, spent many months in [place], whre sadly, they helped their daughter who was in poor health and died last 04-03-22.  Past Medical History  Diagnosis Date  . CAD (coronary artery disease)   . Essential and other specified forms of tremor 07/15/2012  . RBBB   . Mobitz type 2 second degree atrioventricular block   . HTN (hypertension) 01/30/2013    Past Surgical History  Procedure Laterality Date  . Knee surgery Right 2006  . Coronary artery bypass graft  05/10/2007    LIMA to LAD, SVG to 1st and 2nd OM and SVG to RCA  . Cardiac catheterization  04/27/2007    3 vessel disease  . Coronary angioplasty  1988  . Coronary angioplasty with stent placement  1998    LAD  . US echocardiography  10/01/2010    mild  concentric LVH,LA mildly dilated,RA mild to mod. dilated,mild MR,mild to mod. TR,mild AI,mild PI,sinus rhythm w/pauses  . Nm myocar perf wall motion  04/20/2007    mod. reversible ischemia     Current Outpatient Prescriptions  Medication Sig Dispense Refill  . ALPRAZolam (XANAX) 0.25 MG tablet Take 1 tablet (0.25 mg total) by mouth 3 (three) times daily as needed (tremor). (Patient not taking: Reported on 12/14/2014) 30 tablet 3  . amLODipine (NORVASC) 5 MG tablet take 1 tablet by mouth once daily (Patient not taking: Reported on 12/14/2014) 30 tablet 9  . aspirin 81 MG tablet Take 81 mg by mouth daily.    Marland Kitchen atorvastatin (LIPITOR) 20 MG tablet Take 20 mg by mouth daily.  0  . furosemide (LASIX) 20 MG tablet Take 20 mg by mouth daily.  0  . hydrochlorothiazide (MICROZIDE) 12.5 MG capsule take 1 capsule by mouth once daily (Patient not taking: Reported on 12/14/2014) 30 capsule 9  . OVER THE COUNTER MEDICATION Take by mouth daily. Argi-Vive III dietary supplement    . pramoxine (PROCTOFOAM) 1 % foam Place rectally every 2 (two) hours as needed for hemorrhoids. (Patient not taking: Reported on 12/14/2014) 15 g 0  . tadalafil (CIALIS) 5 MG tablet Take 1 tablet (5 mg total) by mouth daily. 90 tablet 3   No current facility-administered medications for this visit.    Allergies:   Review of patient's allergies indicates no known allergies.  Social History:  The patient  reports that he has never smoked. He does not have any smokeless tobacco history on file. He reports that he does not drink alcohol or use illicit drugs.   Family History:  The patient's family history includes Cancer in his brother and father; Pneumonia in his mother.    ROS:  Please see the history of present illness.    Otherwise, review of systems positive for none.   All other systems are reviewed and negative.    PHYSICAL EXAM: VS:  BP 138/58 mmHg  Pulse 93  Ht 5\' 8"  (1.727 m)  Wt 173 lb (78.472 kg)  BMI 26.31  kg/m2 , BMI Body mass index is 26.31 kg/(m^2).  General: Alert, oriented x3, no distress Head: no evidence of trauma, PERRL, EOMI, no exophtalmos or lid lag, no myxedema, no xanthelasma; normal ears, nose and oropharynx Neck: normal jugular venous pulsations and no hepatojugular reflux; brisk carotid pulses without delay and no carotid bruits Chest: clear to auscultation, no signs of consolidation by percussion or palpation, normal fremitus, symmetrical and full respiratory excursions Cardiovascular: normal position and quality of the apical impulse, regular rhythm, normal first and widely split second heart sounds, no murmurs, rubs or gallops Abdomen: no tenderness or distention, no masses by palpation, no abnormal pulsatility or arterial bruits, normal bowel sounds, no hepatosplenomegaly Extremities: no clubbing, cyanosis or edema; 2+ radial, ulnar and brachial pulses bilaterally; 2+ right femoral, posterior tibial and dorsalis pedis pulses; 2+ left femoral, posterior tibial and dorsalis pedis pulses; no subclavian or femoral bruits Neurological: grossly nonfocal Psych: euthymic mood, full affect   EKG:  EKG is ordered today. The ekg ordered today demonstrates SR with PVCs/bigeminy and RBBB (old). No AV block is seen.   Recent Labs: No results found for requested labs within last 365 days.    Lipid Panel    Component Value Date/Time   CHOL 156 07/27/2013 1207   TRIG 60 07/27/2013 1207   HDL 66 07/27/2013 1207   CHOLHDL 2.4 07/27/2013 1207   VLDL 12 07/27/2013 1207   LDLCALC 78 07/27/2013 1207      Wt Readings from Last 3 Encounters:  12/14/14 173 lb (78.472 kg)  07/17/13 183 lb 4.8 oz (83.144 kg)  01/16/13 188 lb 14.4 oz (85.684 kg)    .   ASSESSMENT AND PLAN:  CAD (coronary artery disease)s/p CABG (LIMA to LAD, SVG sequential to first and second OM, SVG to distal RCA) 2009 Asymptomatic, limited physcical exercise due to knee problems.  Second degree AV block, Mobitz  type I Asymptomatic, avoid beta blockers.  RBBB This suggests that he has both supra and infrahisian AV node disease. A pacemaker would be indicated if he develops symptoms of bradycardia, not yet the case.  HTN (hypertension) Well controlled. Note history of left renal artery stenosis. If elevated blood pressure worsens, reevaluate for need for revascularization.  Dyslipidemia Time to repeat his lipid profile.   Prostatism Recommended he follow up with Urology (his old urologist has retired, recommended he call [facility]).  Current medicines are reviewed at length with the patient today.  The patient does not have concerns regarding medicines.  The following changes have been made:  no change  Labs/ tests ordered today include:  Orders Placed This Encounter  Procedures  . Comprehensive metabolic panel  . Lipid panel  . EKG 12-Lead    Patient Instructions  Your physician recommends that you return for lab work in: [facility] AT  YOUR CONVENIENCE  Dr. Sallyanne Kuster recommends that you schedule a follow-up appointment in: ONE YEAR      SignedSanda Klein, MD  12/15/2014 10:24 AM    Sanda Klein, MD, [facility] [phone] office [phone] pager

## 2014-12-17 DIAGNOSIS — M1712 Unilateral primary osteoarthritis, left knee: Secondary | ICD-10-CM | POA: Diagnosis not present

## 2014-12-17 DIAGNOSIS — R2689 Other abnormalities of gait and mobility: Secondary | ICD-10-CM | POA: Diagnosis not present

## 2014-12-17 DIAGNOSIS — M25562 Pain in left knee: Secondary | ICD-10-CM | POA: Diagnosis not present

## 2014-12-18 DIAGNOSIS — F0390 Unspecified dementia without behavioral disturbance: Secondary | ICD-10-CM | POA: Diagnosis not present

## 2014-12-19 DIAGNOSIS — F0150 Vascular dementia without behavioral disturbance: Secondary | ICD-10-CM

## 2014-12-19 HISTORY — DX: Vascular dementia, unspecified severity, without behavioral disturbance, psychotic disturbance, mood disturbance, and anxiety: F01.50

## 2014-12-20 ENCOUNTER — Other Ambulatory Visit: Payer: Self-pay | Admitting: Geriatric Medicine

## 2014-12-20 DIAGNOSIS — F0390 Unspecified dementia without behavioral disturbance: Secondary | ICD-10-CM

## 2014-12-21 DIAGNOSIS — Z79899 Other long term (current) drug therapy: Secondary | ICD-10-CM | POA: Diagnosis not present

## 2014-12-21 DIAGNOSIS — E785 Hyperlipidemia, unspecified: Secondary | ICD-10-CM | POA: Diagnosis not present

## 2014-12-22 LAB — COMPREHENSIVE METABOLIC PANEL
ALT: 9 U/L (ref 9–46)
AST: 16 U/L (ref 10–35)
Albumin: 4 g/dL (ref 3.6–5.1)
Alkaline Phosphatase: 58 U/L (ref 40–115)
BUN: 17 mg/dL (ref 7–25)
CO2: 28 mmol/L (ref 20–31)
Calcium: 9.6 mg/dL (ref 8.6–10.3)
Chloride: 107 mmol/L (ref 98–110)
Creat: 1.16 mg/dL — ABNORMAL HIGH (ref 0.70–1.11)
Glucose, Bld: 93 mg/dL (ref 65–99)
Potassium: 4.9 mmol/L (ref 3.5–5.3)
Sodium: 143 mmol/L (ref 135–146)
Total Bilirubin: 0.9 mg/dL (ref 0.2–1.2)
Total Protein: 7.3 g/dL (ref 6.1–8.1)

## 2014-12-22 LAB — LIPID PANEL
Cholesterol: 156 mg/dL (ref 125–200)
HDL: 66 mg/dL (ref >=40)
LDL Cholesterol: 74 mg/dL (ref <130)
Total CHOL/HDL Ratio: 2.4 Ratio (ref <=5.0)
Triglycerides: 81 mg/dL (ref <150)
VLDL: 16 mg/dL (ref <30)

## 2014-12-24 DIAGNOSIS — N390 Urinary tract infection, site not specified: Secondary | ICD-10-CM | POA: Diagnosis not present

## 2014-12-24 DIAGNOSIS — M159 Polyosteoarthritis, unspecified: Secondary | ICD-10-CM | POA: Diagnosis not present

## 2014-12-24 DIAGNOSIS — I119 Hypertensive heart disease without heart failure: Secondary | ICD-10-CM | POA: Diagnosis not present

## 2014-12-24 DIAGNOSIS — Z0001 Encounter for general adult medical examination with abnormal findings: Secondary | ICD-10-CM | POA: Diagnosis not present

## 2014-12-24 DIAGNOSIS — R32 Unspecified urinary incontinence: Secondary | ICD-10-CM | POA: Diagnosis not present

## 2014-12-24 DIAGNOSIS — Z79899 Other long term (current) drug therapy: Secondary | ICD-10-CM | POA: Diagnosis not present

## 2014-12-24 DIAGNOSIS — I2510 Atherosclerotic heart disease of native coronary artery without angina pectoris: Secondary | ICD-10-CM | POA: Diagnosis not present

## 2014-12-24 DIAGNOSIS — R8299 Other abnormal findings in urine: Secondary | ICD-10-CM | POA: Diagnosis not present

## 2014-12-24 DIAGNOSIS — Z125 Encounter for screening for malignant neoplasm of prostate: Secondary | ICD-10-CM | POA: Diagnosis not present

## 2014-12-24 DIAGNOSIS — Z23 Encounter for immunization: Secondary | ICD-10-CM | POA: Diagnosis not present

## 2014-12-24 DIAGNOSIS — R350 Frequency of micturition: Secondary | ICD-10-CM | POA: Diagnosis not present

## 2014-12-24 DIAGNOSIS — E7800 Pure hypercholesterolemia, unspecified: Secondary | ICD-10-CM | POA: Diagnosis not present

## 2014-12-31 ENCOUNTER — Ambulatory Visit
Admission: RE | Admit: 2014-12-31 | Discharge: 2014-12-31 | Disposition: A | Discharge status: Home / Self Care | Payer: Medicare Other | Source: Ambulatory Visit | Attending: Geriatric Medicine | Admitting: Geriatric Medicine

## 2014-12-31 DIAGNOSIS — F0390 Unspecified dementia without behavioral disturbance: Secondary | ICD-10-CM

## 2015-01-17 DIAGNOSIS — F0150 Vascular dementia without behavioral disturbance: Secondary | ICD-10-CM | POA: Diagnosis not present

## 2015-01-24 DIAGNOSIS — N401 Enlarged prostate with lower urinary tract symptoms: Secondary | ICD-10-CM | POA: Diagnosis not present

## 2015-01-24 DIAGNOSIS — N138 Other obstructive and reflux uropathy: Secondary | ICD-10-CM | POA: Diagnosis not present

## 2015-02-17 HISTORY — PX: TOTAL KNEE ARTHROPLASTY: SHX125

## 2015-03-11 DIAGNOSIS — R32 Unspecified urinary incontinence: Secondary | ICD-10-CM | POA: Diagnosis not present

## 2015-03-11 DIAGNOSIS — E7800 Pure hypercholesterolemia, unspecified: Secondary | ICD-10-CM | POA: Diagnosis not present

## 2015-03-11 DIAGNOSIS — R413 Other amnesia: Secondary | ICD-10-CM | POA: Diagnosis not present

## 2015-03-11 DIAGNOSIS — I119 Hypertensive heart disease without heart failure: Secondary | ICD-10-CM | POA: Diagnosis not present

## 2015-03-11 DIAGNOSIS — M25562 Pain in left knee: Secondary | ICD-10-CM | POA: Diagnosis not present

## 2015-03-11 DIAGNOSIS — M1990 Unspecified osteoarthritis, unspecified site: Secondary | ICD-10-CM | POA: Diagnosis not present

## 2015-03-11 DIAGNOSIS — I2510 Atherosclerotic heart disease of native coronary artery without angina pectoris: Secondary | ICD-10-CM | POA: Diagnosis not present

## 2015-03-11 DIAGNOSIS — Z79899 Other long term (current) drug therapy: Secondary | ICD-10-CM | POA: Diagnosis not present

## 2015-03-22 DIAGNOSIS — M1712 Unilateral primary osteoarthritis, left knee: Secondary | ICD-10-CM | POA: Diagnosis not present

## 2015-03-29 ENCOUNTER — Telehealth: Payer: Self-pay | Admitting: *Deleted

## 2015-03-29 ENCOUNTER — Other Ambulatory Visit: Payer: Self-pay | Admitting: Surgical

## 2015-03-29 NOTE — Telephone Encounter (Signed)
Surgical cardiac clearance faxed to [facility] ----- low-moderate CV risk faxed 03/27/2015.

## 2015-04-09 ENCOUNTER — Telehealth: Payer: Self-pay | Admitting: Cardiovascular Disease

## 2015-04-09 NOTE — Telephone Encounter (Signed)
New message      Calling to check the status on a DMV form that was dropped off

## 2015-04-10 NOTE — Telephone Encounter (Signed)
Wife notified form is ready for pick up at the front desk. Voiced understanding.

## 2015-04-15 DIAGNOSIS — M25562 Pain in left knee: Secondary | ICD-10-CM | POA: Diagnosis not present

## 2015-04-15 DIAGNOSIS — M1990 Unspecified osteoarthritis, unspecified site: Secondary | ICD-10-CM | POA: Diagnosis not present

## 2015-04-15 DIAGNOSIS — E7800 Pure hypercholesterolemia, unspecified: Secondary | ICD-10-CM | POA: Diagnosis not present

## 2015-04-15 DIAGNOSIS — I2510 Atherosclerotic heart disease of native coronary artery without angina pectoris: Secondary | ICD-10-CM | POA: Diagnosis not present

## 2015-04-15 DIAGNOSIS — R32 Unspecified urinary incontinence: Secondary | ICD-10-CM | POA: Diagnosis not present

## 2015-04-15 DIAGNOSIS — R413 Other amnesia: Secondary | ICD-10-CM | POA: Diagnosis not present

## 2015-04-15 DIAGNOSIS — I119 Hypertensive heart disease without heart failure: Secondary | ICD-10-CM | POA: Diagnosis not present

## 2015-04-15 DIAGNOSIS — Z79899 Other long term (current) drug therapy: Secondary | ICD-10-CM | POA: Diagnosis not present

## 2015-04-22 ENCOUNTER — Telehealth: Payer: Self-pay | Admitting: Cardiovascular Disease

## 2015-04-22 NOTE — Telephone Encounter (Signed)
Wife called. I do not have a copy of EKG but gave reassurance if ventricular ectopy noted on finding, probably OK. Pt and wife aware I will forward to Dr. Loletha Grayer for any advisements or considerations.

## 2015-04-22 NOTE — Telephone Encounter (Signed)
Pt's wife called in stating that the pt just did an assessment for [facility] and the EKG read that he had ventricular ectopy. The wife is concerned because he is scheduled to have knee replacement surgery on 3/22 and she wanted to make sure that this would not effect him. Please f/u with her  Thanks

## 2015-04-22 NOTE — Telephone Encounter (Signed)
Agree, that should not prevent him from having surgery. He should not be administered beta blockers. These have caused second-degree heart block in the past.

## 2015-04-23 NOTE — Telephone Encounter (Signed)
Recommendations given and understanding verbalized.

## 2015-04-29 DIAGNOSIS — R351 Nocturia: Secondary | ICD-10-CM | POA: Diagnosis not present

## 2015-04-29 DIAGNOSIS — N138 Other obstructive and reflux uropathy: Secondary | ICD-10-CM | POA: Diagnosis not present

## 2015-04-29 DIAGNOSIS — Z8551 Personal history of malignant neoplasm of bladder: Secondary | ICD-10-CM | POA: Diagnosis not present

## 2015-04-29 DIAGNOSIS — N401 Enlarged prostate with lower urinary tract symptoms: Secondary | ICD-10-CM | POA: Diagnosis not present

## 2015-04-29 DIAGNOSIS — Z0000 Encounter for general adult medical examination without abnormal findings: Secondary | ICD-10-CM | POA: Diagnosis not present

## 2015-04-30 NOTE — Patient Instructions (Addendum)
Sem L Holloran  04/30/2015   Your procedure is scheduled on: 05/08/2015    Report to [facility] Main  Entrance take [place]  elevators to 3rd floor to  [place] at   [place] AM.  Call this number if you have problems the morning of surgery [phone]   Remember: ONLY 1 PERSON MAY GO WITH YOU TO SHORT STAY TO GET  READY MORNING OF [place].  Do not eat food or drink liquids :After Midnight.     Take these medicines the morning of surgery with A SIP OF WATER: none                                 You may not have any metal on your body including hair pins and              piercings  Do not wear jewelry, , lotions, powders or perfumes, deodorant                         Men may shave face and neck.   Do not bring valuables to the hospital. [place].  Contacts, dentures or bridgework may not be worn into surgery.  Leave suitcase in the car. After surgery it may be brought to your room.         Special Instructions: coughing and deep breathing exercises, leg exercises               Please read over the following fact sheets you were given: _____________________________________________________________________             [facility] - Preparing for Surgery Before surgery, you can play an important role.  Because skin is not sterile, your skin needs to be as free of germs as possible.  You can reduce the number of germs on your skin by washing with CHG (chlorahexidine gluconate) soap before surgery.  CHG is an antiseptic cleaner which kills germs and bonds with the skin to continue killing germs even after washing. Please DO NOT use if you have an allergy to CHG or antibacterial soaps.  If your skin becomes reddened/irritated stop using the CHG and inform your nurse when you arrive at Short Stay. Do not shave (including legs and underarms) for at least 48 hours prior to the first CHG shower.  You may  shave your face/neck. Please follow these instructions carefully:  1.  Shower with CHG Soap the night before surgery and the  morning of Surgery.  2.  If you choose to wash your hair, wash your hair first as usual with your  normal  shampoo.  3.  After you shampoo, rinse your hair and body thoroughly to remove the  shampoo.                           4.  Use CHG as you would any other liquid soap.  You can apply chg directly  to the skin and wash                       Gently with a scrungie or clean washcloth.  5.  Apply  the CHG Soap to your body ONLY FROM THE NECK DOWN.   Do not use on face/ open                           Wound or open sores. Avoid contact with eyes, ears mouth and genitals (private parts).                       Wash face,  Genitals (private parts) with your normal soap.             6.  Wash thoroughly, paying special attention to the area where your surgery  will be performed.  7.  Thoroughly rinse your body with warm water from the neck down.  8.  DO NOT shower/wash with your normal soap after using and rinsing off  the CHG Soap.                9.  Pat yourself dry with a clean towel.            10.  Wear clean pajamas.            11.  Place clean sheets on your bed the night of your first shower and do not  sleep with pets. Day of Surgery : Do not apply any lotions/deodorants the morning of surgery.  Please wear clean clothes to the hospital/surgery center.  FAILURE TO FOLLOW THESE INSTRUCTIONS MAY RESULT IN THE CANCELLATION OF YOUR SURGERY PATIENT SIGNATURE_________________________________  NURSE SIGNATURE__________________________________  ________________________________________________________________________  WHAT IS A BLOOD TRANSFUSION? Blood Transfusion Information  A transfusion is the replacement of blood or some of its parts. Blood is made up of multiple cells which provide different functions.  Red blood cells carry oxygen and are used for blood loss  replacement.  White blood cells fight against infection.  Platelets control bleeding.  Plasma helps clot blood.  Other blood products are available for specialized needs, such as hemophilia or other clotting disorders. BEFORE THE TRANSFUSION  Who gives blood for transfusions?   Healthy volunteers who are fully evaluated to make sure their blood is safe. This is blood bank blood. Transfusion therapy is the safest it has ever been in the practice of medicine. Before blood is taken from a donor, a complete history is taken to make sure that person has no history of diseases nor engages in risky social behavior (examples are intravenous drug use or sexual activity with multiple partners). The donor's travel history is screened to minimize risk of transmitting infections, such as malaria. The donated blood is tested for signs of infectious diseases, such as HIV and hepatitis. The blood is then tested to be sure it is compatible with you in order to minimize the chance of a transfusion reaction. If you or a relative donates blood, this is often done in anticipation of surgery and is not appropriate for emergency situations. It takes many days to process the donated blood. RISKS AND COMPLICATIONS Although transfusion therapy is very safe and saves many lives, the main dangers of transfusion include:  1. Getting an infectious disease. 2. Developing a transfusion reaction. This is an allergic reaction to something in the blood you were given. Every precaution is taken to prevent this. The decision to have a blood transfusion has been considered carefully by your caregiver before blood is given. Blood is not given unless the benefits outweigh the risks. AFTER THE TRANSFUSION  Right after receiving a blood  transfusion, you will usually feel much better and more energetic. This is especially true if your red blood cells have gotten low (anemic). The transfusion raises the level of the red blood cells which  carry oxygen, and this usually causes an energy increase.  The nurse administering the transfusion will monitor you carefully for complications. HOME CARE INSTRUCTIONS  No special instructions are needed after a transfusion. You may find your energy is better. Speak with your caregiver about any limitations on activity for underlying diseases you may have. SEEK MEDICAL CARE IF:   Your condition is not improving after your transfusion.  You develop redness or irritation at the intravenous (IV) site. SEEK IMMEDIATE MEDICAL CARE IF:  Any of the following symptoms occur over the next 12 hours:  Shaking chills.  You have a temperature by mouth above 102 F (38.9 C), not controlled by medicine.  Chest, back, or muscle pain.  People around you feel you are not acting correctly or are confused.  Shortness of breath or difficulty breathing.  Dizziness and fainting.  You get a rash or develop hives.  You have a decrease in urine output.  Your urine turns a dark color or changes to pink, red, or brown. Any of the following symptoms occur over the next 10 days:  You have a temperature by mouth above 102 F (38.9 C), not controlled by medicine.  Shortness of breath.  Weakness after normal activity.  The white part of the eye turns yellow (jaundice).  You have a decrease in the amount of urine or are urinating less often.  Your urine turns a dark color or changes to pink, red, or brown. Document Released: 01/31/2000 Document Revised: 04/27/2011 Document Reviewed: 09/19/2007 ExitCare Patient Information 2014 [place].  _______________________________________________________________________  Incentive Spirometer  An incentive spirometer is a tool that can help keep your lungs clear and active. This tool measures how well you are filling your lungs with each breath. Taking long deep breaths may help reverse or decrease the chance of developing breathing (pulmonary) problems  (especially infection) following:  A long period of time when you are unable to move or be active. BEFORE THE PROCEDURE   If the spirometer includes an indicator to show your best effort, your nurse or respiratory therapist will set it to a desired goal.  If possible, sit up straight or lean slightly forward. Try not to slouch.  Hold the incentive spirometer in an upright position. INSTRUCTIONS FOR USE  3. Sit on the edge of your bed if possible, or sit up as far as you can in bed or on a chair. 4. Hold the incentive spirometer in an upright position. 5. Breathe out normally. 6. Place the mouthpiece in your mouth and seal your lips tightly around it. 7. Breathe in slowly and as deeply as possible, raising the piston or the ball toward the top of the column. 8. Hold your breath for 3-5 seconds or for as long as possible. Allow the piston or ball to fall to the bottom of the column. 9. Remove the mouthpiece from your mouth and breathe out normally. 10. Rest for a few seconds and repeat Steps 1 through 7 at least 10 times every 1-2 hours when you are awake. Take your time and take a few normal breaths between deep breaths. 11. The spirometer may include an indicator to show your best effort. Use the indicator as a goal to work toward during each repetition. 12. After each set of 10 deep  breaths, practice coughing to be sure your lungs are clear. If you have an incision (the cut made at the time of surgery), support your incision when coughing by placing a pillow or rolled up towels firmly against it. Once you are able to get out of bed, walk around indoors and cough well. You may stop using the incentive spirometer when instructed by your caregiver.  RISKS AND COMPLICATIONS  Take your time so you do not get dizzy or light-headed.  If you are in pain, you may need to take or ask for pain medication before doing incentive spirometry. It is harder to take a deep breath if you are having  pain. AFTER USE  Rest and breathe slowly and easily.  It can be helpful to keep track of a log of your progress. Your caregiver can provide you with a simple table to help with this. If you are using the spirometer at home, follow these instructions: [place] IF:   You are having difficultly using the spirometer.  You have trouble using the spirometer as often as instructed.  Your pain medication is not giving enough relief while using the spirometer.  You develop fever of 100.5 F (38.1 C) or higher. SEEK IMMEDIATE MEDICAL CARE IF:   You cough up bloody sputum that had not been present before.  You develop fever of 102 F (38.9 C) or greater.  You develop worsening pain at or near the incision site. MAKE SURE YOU:   Understand these instructions.  Will watch your condition.  Will get help right away if you are not doing well or get worse. Document Released: 06/15/2006 Document Revised: 04/27/2011 Document Reviewed: 08/16/2006 [facility] Patient Information 2014 [place], [place].   ________________________________________________________________________

## 2015-05-01 ENCOUNTER — Ambulatory Visit (HOSPITAL_COMMUNITY)
Admission: RE | Admit: 2015-05-01 | Discharge: 2015-05-01 | Disposition: A | Discharge status: Home / Self Care | Payer: Medicare Other | Source: Ambulatory Visit | Attending: Surgical | Admitting: Surgical

## 2015-05-01 ENCOUNTER — Encounter (HOSPITAL_COMMUNITY)
Admission: RE | Admit: 2015-05-01 | Discharge: 2015-05-01 | Disposition: A | Discharge status: Home / Self Care | Payer: Medicare Other | Source: Ambulatory Visit | Attending: Orthopedic Surgery | Admitting: Orthopedic Surgery

## 2015-05-01 ENCOUNTER — Encounter (HOSPITAL_COMMUNITY): Payer: Self-pay

## 2015-05-01 ENCOUNTER — Telehealth: Payer: Self-pay | Admitting: Cardiovascular Disease

## 2015-05-01 DIAGNOSIS — Z01818 Encounter for other preprocedural examination: Secondary | ICD-10-CM

## 2015-05-01 HISTORY — DX: Frequency of micturition: R35.0

## 2015-05-01 HISTORY — DX: Malignant (primary) neoplasm, unspecified: C80.1

## 2015-05-01 LAB — ABO/RH: ABO/RH(D): O POS

## 2015-05-01 LAB — COMPREHENSIVE METABOLIC PANEL
ALT: 13 U/L — ABNORMAL LOW (ref 17–63)
AST: 25 U/L (ref 15–41)
Albumin: 4.2 g/dL (ref 3.5–5.0)
Alkaline Phosphatase: 51 U/L (ref 38–126)
Anion gap: 10 (ref 5–15)
BUN: 22 mg/dL — ABNORMAL HIGH (ref 6–20)
CO2: 24 mmol/L (ref 22–32)
Calcium: 9.6 mg/dL (ref 8.9–10.3)
Chloride: 110 mmol/L (ref 101–111)
Creatinine, Ser: 1.18 mg/dL (ref 0.61–1.24)
GFR calc Af Amer: >60 mL/min (ref >60)
GFR calc non Af Amer: 53 mL/min — ABNORMAL LOW (ref >60)
Glucose, Bld: 110 mg/dL — ABNORMAL HIGH (ref 65–99)
Potassium: 4.2 mmol/L (ref 3.5–5.1)
Sodium: 144 mmol/L (ref 135–145)
Total Bilirubin: 0.9 mg/dL (ref 0.3–1.2)
Total Protein: 7.5 g/dL (ref 6.5–8.1)

## 2015-05-01 LAB — CBC WITH DIFFERENTIAL/PLATELET
Basophils Absolute: 0 10*3/uL (ref 0.0–0.1)
Basophils Relative: 0 %
Eosinophils Absolute: 0.2 10*3/uL (ref 0.0–0.7)
Eosinophils Relative: 4 %
HCT: 39.5 % (ref 39.0–52.0)
Hemoglobin: 13.4 g/dL (ref 13.0–17.0)
Lymphocytes Relative: 37 %
Lymphs Abs: 1.4 10*3/uL (ref 0.7–4.0)
MCH: 33.3 pg (ref 26.0–34.0)
MCHC: 33.9 g/dL (ref 30.0–36.0)
MCV: 98 fL (ref 78.0–100.0)
Monocytes Absolute: 0.4 10*3/uL (ref 0.1–1.0)
Monocytes Relative: 11 %
Neutro Abs: 1.8 10*3/uL (ref 1.7–7.7)
Neutrophils Relative %: 48 %
Platelets: 144 10*3/uL — ABNORMAL LOW (ref 150–400)
RBC: 4.03 MIL/uL — ABNORMAL LOW (ref 4.22–5.81)
RDW: 12.8 % (ref 11.5–15.5)
WBC: 3.8 10*3/uL — ABNORMAL LOW (ref 4.0–10.5)

## 2015-05-01 LAB — URINALYSIS, ROUTINE W REFLEX MICROSCOPIC
Bilirubin Urine: NEGATIVE
Glucose, UA: NEGATIVE mg/dL
Hgb urine dipstick: NEGATIVE
Ketones, ur: NEGATIVE mg/dL
Leukocytes, UA: NEGATIVE
Nitrite: NEGATIVE
Protein, ur: NEGATIVE mg/dL
Specific Gravity, Urine: 1.02 (ref 1.005–1.030)
pH: 5.5 (ref 5.0–8.0)

## 2015-05-01 LAB — SURGICAL PCR SCREEN
MRSA, PCR: NEGATIVE
STAPHYLOCOCCUS AUREUS: NEGATIVE

## 2015-05-01 LAB — PROTIME-INR
INR: 1.06 (ref 0.00–1.49)
Prothrombin Time: 14 seconds (ref 11.6–15.2)

## 2015-05-01 LAB — APTT: aPTT: 35 seconds (ref 24–37)

## 2015-05-01 NOTE — Progress Notes (Signed)
DR Myrtie Soman ( anesthesia) aware of EKG from 12/14/2014 with EKGs done 05/01/2015.  Dr Kalman Shan gave no further orders.  Also stated to keep EKG ( since 2 EKGs done ) of 05/01/2015 1359:19.  And place on chart.  Dr Kalman Shan aware patient has clearance in telephone note dated 03/29/15 from Dr Loleta Chance for surgery.  Also aware of MRI brain results from 12/2014.  DR Myrtie Soman made aware patient ran out of Amlodipine 2.5 mg po 2 weeks ago per patient and he has not yet refilled.  I told Dr Myrtie Soman I would notify whichever MD prescribed and let them be aware of this. No further orders given.

## 2015-05-01 NOTE — Progress Notes (Signed)
Called office of Dr Loleta Chance and spoke with Bernardo Heater, RN ( Triage Nurse) and made her aware that patient ran out of Amlodipine ( Norvasc) 2 weeks ago and has not been taking.  Bernardo Heater, RN stated she would let Dr Loleta Chance be aware and go from there and call patient and would take care of.  Bernardo Heater , RN aware B/P at preop visit- 162/76 with pulse of 76.

## 2015-05-01 NOTE — Progress Notes (Signed)
EKG- 12/14/2014-EPIC  LOV- Cardiology- 12/14/2014- EPIC

## 2015-05-01 NOTE — Telephone Encounter (Signed)
Pt went in for pre-op for evaluations before surgery of left knee scheduled for next week.  Pt stated during that visit that he is not taking his Norvasc for the past 2 weeks and he has been taking 2.5 mg daily not the ordered 5 mg daily.  Tried to call pt to verify, left message to call back.

## 2015-05-02 ENCOUNTER — Telehealth: Payer: Self-pay | Admitting: Cardiovascular Disease

## 2015-05-02 NOTE — Telephone Encounter (Signed)
New message       Pt has 2 more refills on his amlodipine.  She forgot to tell you this earlier when she talked to a nurse

## 2015-05-02 NOTE — Telephone Encounter (Signed)
Returned call to patient's wife Dr.Croitoru's recommendation given.Advised to call office back on Monday 05/06/15 to report B/P readings.

## 2015-05-02 NOTE — Telephone Encounter (Signed)
WILL PLACED INFORMATION ON RECENT TELEPHONE MESSAGE

## 2015-05-02 NOTE — Telephone Encounter (Signed)
Spoke to patient's wife.She stated husband has dementia.Stated he has not been taking medications as prescribed.Stated she just realized he needs help with medication and she has started helping him.Stated she is having a hard time he is in denial about dementia.Medication reviewed with wife and he is taking the following Atorvastatin 20 mg daily,Rapaflo 8 mg daily,Aspirin 81 mg daily,Donepezil 5 mg every night.Patient has not been taking norvasc 5 mg daily,not taking lasix,not taking hctz.Stated patient is scheduled for left total knee replacement 05/07/15 by [place] Message sent to Dr.Croitoru for advice.

## 2015-05-02 NOTE — Telephone Encounter (Signed)
PATIENT ONLY HAS 2 PILLS LEFT OF AMOLIDIPINE  TO TAKE  PER WIFE  WHO CALLED BACK TO OFFICE

## 2015-05-02 NOTE — Telephone Encounter (Signed)
Do they have a home BP cuff? What is his current BP? Continue the statin, Rapaflo, aspirin and Aricept at same doses.  If BP >140/90, take Amlodipine 2.5 mg daily and check BP daily. Stay off lasix and HCTZ for now. Call back with BP readings Monday.

## 2015-05-06 NOTE — H&P (Signed)
TOTAL KNEE ADMISSION H&P  Patient is being admitted for left total knee arthroplasty.  Subjective:  Chief Complaint:left knee pain.  HPI: [name], 82 y.o. male, has a history of pain and functional disability in the left knee due to arthritis and has failed non-surgical conservative treatments for greater than 12 weeks to includeNSAID's and/or analgesics, corticosteriod injections, flexibility and strengthening excercises, use of assistive devices and activity modification.  Onset of symptoms was gradual, starting >10 years ago with gradually worsening course since that time. The patient noted no past surgery on the left knee(s).  Patient currently rates pain in the left knee(s) at 8 out of 10 with activity. Patient has night pain, worsening of pain with activity and weight bearing, pain that interferes with activities of daily living, pain with passive range of motion, crepitus and joint swelling.  Patient has evidence of periarticular osteophytes, joint subluxation and joint space narrowing by imaging studies. There is no active infection.  Patient Active Problem List   Diagnosis Date Noted  . HTN (hypertension) 01/30/2013  . Erectile dysfunction 09/17/2012  . RBBB 09/17/2012  . CAD (coronary artery disease)s/p CABG (LIMA to LAD, SVG sequential to first and second OM, SVG to distal RCA) 2009 09/17/2012  . Second degree AV block, Mobitz type I 09/17/2012  . Dyslipidemia 09/17/2012  . Prostatism 09/17/2012  . PAD (peripheral artery disease) ([place]) 09/17/2012  . Essential and other specified forms of tremor 07/15/2012   Past Medical History  Diagnosis Date  . CAD (coronary artery disease)   . Essential and other specified forms of tremor 07/15/2012  . RBBB   . Mobitz type 2 second degree atrioventricular block   . HTN (hypertension) 01/30/2013  . Urinary frequency   . Cancer [facility])     hx of bladder cancer- 2002     Past Surgical History  Procedure Laterality Date  . Knee  surgery Right 2006  . Coronary artery bypass graft  05/10/2007    LIMA to LAD, SVG to 1st and 2nd OM and SVG to RCA  . Cardiac catheterization  04/27/2007    3 vessel disease  . Coronary angioplasty  1988  . Coronary angioplasty with stent placement  1998    LAD  . US echocardiography  10/01/2010    mild concentric LVH,LA mildly dilated,RA mild to mod. dilated,mild MR,mild to mod. TR,mild AI,mild PI,sinus rhythm w/pauses  . Nm myocar perf wall motion  04/20/2007    mod. reversible ischemia    Current outpatient prescriptions:  .  aspirin 81 MG tablet, Take 81 mg by mouth daily., Disp: , Rfl:  .  atorvastatin (LIPITOR) 20 MG tablet, Take 20 mg by mouth at bedtime. , Disp: , Rfl: 0 .  donepezil (ARICEPT) 5 MG tablet, Take 5 mg by mouth at bedtime., Disp: , Rfl:  .  silodosin (RAPAFLO) 8 MG CAPS capsule, Take 8 mg by mouth at bedtime., Disp: , Rfl:    No Known Allergies  Social History  Substance Use Topics  . Smoking status: Never Smoker   . Smokeless tobacco: Never Used  . Alcohol Use: No    Family History  Problem Relation Age of Onset  . Pneumonia Mother   . Cancer Father   . Cancer Brother      Review of Systems  Constitutional: Positive for malaise/fatigue. Negative for fever, chills, weight loss and diaphoresis.  HENT: Positive for tinnitus. Negative for congestion, ear discharge, ear pain, hearing loss, nosebleeds and sore throat.  Eyes: Negative.   Respiratory: Positive for shortness of breath. Negative for cough, hemoptysis, sputum production, wheezing and stridor.        SOB with exertion  Cardiovascular: Negative.   Gastrointestinal: Negative.   Genitourinary: Negative.   Musculoskeletal: Positive for myalgias, back pain and joint pain. Negative for falls and neck pain.  Skin: Negative.   Neurological: Negative.  Negative for weakness and headaches.  Endo/Heme/Allergies: Negative.   Psychiatric/Behavioral: Positive for memory loss. Negative for depression,  suicidal ideas, hallucinations and substance abuse. The patient is not nervous/anxious and does not have insomnia.     Objective:  Physical Exam  Constitutional: He appears well-developed and well-nourished. No distress.  HENT:  Head: Normocephalic and atraumatic.  Right Ear: External ear normal.  Left Ear: External ear normal.  Nose: Nose normal.  Mouth/Throat: Oropharynx is clear and moist.  Eyes: Conjunctivae and EOM are normal.  Neck: Normal range of motion. Neck supple.  Cardiovascular: Normal rate, normal heart sounds and intact distal pulses.  A regularly irregular rhythm present.  No murmur heard. Respiratory: Effort normal and breath sounds normal. No respiratory distress. He has no wheezes.  GI: Soft. Bowel sounds are normal. He exhibits no distension. There is no tenderness.  Musculoskeletal:       Right hip: Normal.       Left hip: Normal.       Right knee: He exhibits decreased range of motion. He exhibits no swelling and no erythema. No tenderness found.       Left knee: He exhibits decreased range of motion and swelling. He exhibits no effusion and no erythema. Tenderness found. Medial joint line and lateral joint line tenderness noted.  Neurological: He has normal strength. No sensory deficit.  Skin: He is not diaphoretic.    Vitals  Weight: 174 lb Height: 67.5in Body Surface Area: 1.92 m Body Mass Index: 26.85 kg/m  BP: 168/82 (Sitting, Left Arm, Standard) HR: 76 bpm   Imaging Review Plain radiographs demonstrate severe degenerative joint disease of the left knee(s). The overall alignment issignificant varus. The bone quality appears to be good for age and reported activity level.  Assessment/Plan:  End stage primary osteoarthritis, left knee   The patient history, physical examination, clinical judgment of the provider and imaging studies are consistent with end stage degenerative joint disease of the left knee(s) and total knee arthroplasty is  deemed medically necessary. The treatment options including medical management, injection therapy arthroscopy and arthroplasty were discussed at length. The risks and benefits of total knee arthroplasty were presented and reviewed. The risks due to aseptic loosening, infection, stiffness, patella tracking problems, thromboembolic complications and other imponderables were discussed. The patient acknowledged the explanation, agreed to proceed with the plan and consent was signed. Patient is being admitted for inpatient treatment for surgery, pain control, PT, OT, prophylactic antibiotics, VTE prophylaxis, progressive ambulation and ADL's and discharge planning. The patient is planning to be discharged home with outpatient therapy.   Topical TXA Directly to outpatient therapy    Ardeen Jourdain, PA-C

## 2015-05-07 NOTE — Progress Notes (Signed)
In looking at progress note per Dr Loleta Chance on 05/02/2015 I called patient and wife to verify what medications patient is currently taking.  Both patient and wife were on speakerphone.  Patient reports he is taking the Rapaflo 8mg  po at bedtime, Atorvastatin 20 mg po at bedtime and the Donepezil 5 mg po at bedtime.  Patient reports at the time of this phone call he has not been taking the Amlodipine .   I reviewed with him and wife the arrival time of  0530 and to be in short stay on 3rd floor at 0530.  Patient is aware that he will be given a gown to wear to OR and that the nurses will check his vital signs upon arrival to short stay.  Wife asked about medical alert bracelet and I told her to leave at home along with his partial.  Both voiced understanding.  They are also aware he is not to wear any lotions, powders, perfumes or deodorant.

## 2015-05-07 NOTE — Anesthesia Preprocedure Evaluation (Addendum)
Anesthesia Evaluation  Patient identified by MRN, date of birth, ID band Patient awake    Reviewed: Allergy & Precautions, H&P , NPO status , Patient's Chart, lab work & pertinent test results  Airway Mallampati: II  TM Distance: >3 FB Neck ROM: Full    Dental no notable dental hx. (+) Teeth Intact, Dental Advisory Given   Pulmonary neg pulmonary ROS,    Pulmonary exam normal breath sounds clear to auscultation       Cardiovascular hypertension, Pt. on medications + CAD, + CABG and + Peripheral Vascular Disease  + dysrhythmias  Rhythm:Regular Rate:Normal + Systolic murmurs    Neuro/Psych negative neurological ROS  negative psych ROS   GI/Hepatic negative GI ROS, Neg liver ROS,   Endo/Other  negative endocrine ROS  Renal/GU negative Renal ROS  negative genitourinary   Musculoskeletal   Abdominal   Peds  Hematology negative hematology ROS (+)   Anesthesia Other Findings   Reproductive/Obstetrics negative OB ROS                            Anesthesia Physical Anesthesia Plan  ASA: III  Anesthesia Plan: MAC and Spinal   Post-op Pain Management:    Induction: Intravenous  Airway Management Planned: Simple Face Mask  Additional Equipment:   Intra-op Plan:   Post-operative Plan:   Informed Consent: I have reviewed the patients History and Physical, chart, labs and discussed the procedure including the risks, benefits and alternatives for the proposed anesthesia with the patient or authorized representative who has indicated his/her understanding and acceptance.   Dental advisory given  Plan Discussed with: CRNA  Anesthesia Plan Comments:        Anesthesia Quick Evaluation

## 2015-05-08 ENCOUNTER — Inpatient Hospital Stay (HOSPITAL_COMMUNITY): Payer: Medicare Other | Admitting: Anesthesiology

## 2015-05-08 ENCOUNTER — Encounter (HOSPITAL_COMMUNITY)
Admission: RE | Disposition: A | Discharge status: Home Health | Payer: Self-pay | Source: Ambulatory Visit | Attending: Orthopedic Surgery

## 2015-05-08 ENCOUNTER — Inpatient Hospital Stay (HOSPITAL_COMMUNITY)
Admission: RE | Admit: 2015-05-08 | Discharge: 2015-05-10 | DRG: 470 | Disposition: A | Discharge status: Home Health | Payer: Medicare Other | Source: Ambulatory Visit | Attending: Orthopedic Surgery | Admitting: Orthopedic Surgery

## 2015-05-08 ENCOUNTER — Encounter (HOSPITAL_COMMUNITY): Payer: Self-pay | Admitting: *Deleted

## 2015-05-08 DIAGNOSIS — I10 Essential (primary) hypertension: Secondary | ICD-10-CM | POA: Diagnosis present

## 2015-05-08 DIAGNOSIS — I25810 Atherosclerosis of coronary artery bypass graft(s) without angina pectoris: Secondary | ICD-10-CM | POA: Diagnosis not present

## 2015-05-08 DIAGNOSIS — I739 Peripheral vascular disease, unspecified: Secondary | ICD-10-CM | POA: Diagnosis present

## 2015-05-08 DIAGNOSIS — Z955 Presence of coronary angioplasty implant and graft: Secondary | ICD-10-CM | POA: Diagnosis not present

## 2015-05-08 DIAGNOSIS — I2510 Atherosclerotic heart disease of native coronary artery without angina pectoris: Secondary | ICD-10-CM | POA: Diagnosis present

## 2015-05-08 DIAGNOSIS — Z951 Presence of aortocoronary bypass graft: Secondary | ICD-10-CM | POA: Diagnosis not present

## 2015-05-08 DIAGNOSIS — M24562 Contracture, left knee: Secondary | ICD-10-CM | POA: Diagnosis present

## 2015-05-08 DIAGNOSIS — M21162 Varus deformity, not elsewhere classified, left knee: Secondary | ICD-10-CM | POA: Diagnosis present

## 2015-05-08 DIAGNOSIS — M25562 Pain in left knee: Secondary | ICD-10-CM | POA: Diagnosis not present

## 2015-05-08 DIAGNOSIS — Z96659 Presence of unspecified artificial knee joint: Secondary | ICD-10-CM

## 2015-05-08 DIAGNOSIS — F0280 Dementia in other diseases classified elsewhere without behavioral disturbance: Secondary | ICD-10-CM | POA: Diagnosis present

## 2015-05-08 DIAGNOSIS — E785 Hyperlipidemia, unspecified: Secondary | ICD-10-CM | POA: Diagnosis present

## 2015-05-08 DIAGNOSIS — Z8551 Personal history of malignant neoplasm of bladder: Secondary | ICD-10-CM

## 2015-05-08 DIAGNOSIS — M1712 Unilateral primary osteoarthritis, left knee: Principal | ICD-10-CM | POA: Diagnosis present

## 2015-05-08 DIAGNOSIS — Z7982 Long term (current) use of aspirin: Secondary | ICD-10-CM

## 2015-05-08 DIAGNOSIS — Z79899 Other long term (current) drug therapy: Secondary | ICD-10-CM

## 2015-05-08 DIAGNOSIS — M179 Osteoarthritis of knee, unspecified: Secondary | ICD-10-CM | POA: Diagnosis not present

## 2015-05-08 HISTORY — DX: Dementia in other diseases classified elsewhere, unspecified severity, without behavioral disturbance, psychotic disturbance, mood disturbance, and anxiety: F02.80

## 2015-05-08 HISTORY — DX: Presence of unspecified artificial knee joint: Z96.659

## 2015-05-08 HISTORY — PX: JOINT REPLACEMENT: SHX530

## 2015-05-08 HISTORY — PX: TOTAL KNEE ARTHROPLASTY: SHX125

## 2015-05-08 LAB — TYPE AND SCREEN
ABO/RH(D): O POS
Antibody Screen: NEGATIVE

## 2015-05-08 SURGERY — ARTHROPLASTY, KNEE, TOTAL
Anesthesia: Monitor Anesthesia Care | Site: Knee | Laterality: Left

## 2015-05-08 MED ORDER — TAMSULOSIN HCL 0.4 MG PO CAPS
0.4000 mg | ORAL_CAPSULE | Freq: Every day | ORAL | Status: DC
  Administered 2015-05-08 – 2015-05-09 (×2): 0.4 mg via ORAL
  Filled 2015-05-08 (×3): qty 1

## 2015-05-08 MED ORDER — CEFAZOLIN SODIUM-DEXTROSE 2-3 GM-% IV SOLR
INTRAVENOUS | Status: AC
  Filled 2015-05-08: qty 50

## 2015-05-08 MED ORDER — POLYETHYLENE GLYCOL 3350 17 G PO PACK: 17.0000 g | PACK | Freq: Every day | ORAL | Status: DC | PRN

## 2015-05-08 MED ORDER — BISACODYL 5 MG PO TBEC: 5.0000 mg | DELAYED_RELEASE_TABLET | Freq: Every day | ORAL | Status: DC | PRN

## 2015-05-08 MED ORDER — THROMBIN 5000 UNITS EX SOLR
CUTANEOUS | Status: AC
  Filled 2015-05-08: qty 5000

## 2015-05-08 MED ORDER — DONEPEZIL HCL 5 MG PO TABS
5.0000 mg | ORAL_TABLET | Freq: Every day | ORAL | Status: DC
  Administered 2015-05-08 – 2015-05-09 (×2): 5 mg via ORAL
  Filled 2015-05-08 (×3): qty 1

## 2015-05-08 MED ORDER — CHLORHEXIDINE GLUCONATE 4 % EX LIQD: 60.0000 mL | Freq: Once | CUTANEOUS | Status: DC

## 2015-05-08 MED ORDER — METHOCARBAMOL 500 MG PO TABS
500.0000 mg | ORAL_TABLET | Freq: Four times a day (QID) | ORAL | Status: DC | PRN
  Administered 2015-05-08: 500 mg via ORAL
  Filled 2015-05-08: qty 1

## 2015-05-08 MED ORDER — SODIUM CHLORIDE 0.9 % IR SOLN
Status: DC | PRN
  Administered 2015-05-08: 1000 mL

## 2015-05-08 MED ORDER — PHENOL 1.4 % MT LIQD
1.0000 | OROMUCOSAL | Status: DC | PRN
  Filled 2015-05-08: qty 177

## 2015-05-08 MED ORDER — LACTATED RINGERS IV SOLN
INTRAVENOUS | Status: DC | PRN
Start: 2015-05-08 — End: 2015-05-08
  Administered 2015-05-08 (×2): via INTRAVENOUS

## 2015-05-08 MED ORDER — FERROUS SULFATE 325 (65 FE) MG PO TABS
325.0000 mg | ORAL_TABLET | Freq: Three times a day (TID) | ORAL | Status: DC
  Administered 2015-05-08 – 2015-05-10 (×6): 325 mg via ORAL
  Filled 2015-05-08 (×8): qty 1

## 2015-05-08 MED ORDER — PROPOFOL 10 MG/ML IV BOLUS
INTRAVENOUS | Status: AC
  Filled 2015-05-08: qty 20

## 2015-05-08 MED ORDER — PHENYLEPHRINE HCL 10 MG/ML IJ SOLN
INTRAMUSCULAR | Status: AC
  Filled 2015-05-08: qty 1

## 2015-05-08 MED ORDER — PROPOFOL 10 MG/ML IV BOLUS
INTRAVENOUS | Status: DC | PRN
  Administered 2015-05-08: 30 mg via INTRAVENOUS

## 2015-05-08 MED ORDER — SODIUM CHLORIDE 0.9 % IR SOLN
Status: DC | PRN
  Administered 2015-05-08: 500 mL

## 2015-05-08 MED ORDER — ONDANSETRON HCL 4 MG/2ML IJ SOLN: 4.0000 mg | Freq: Four times a day (QID) | INTRAMUSCULAR | Status: DC | PRN

## 2015-05-08 MED ORDER — BUPIVACAINE HCL (PF) 0.25 % IJ SOLN
INTRAMUSCULAR | Status: AC
  Filled 2015-05-08: qty 30

## 2015-05-08 MED ORDER — LACTATED RINGERS IV SOLN
INTRAVENOUS | Status: DC
  Administered 2015-05-08 (×2): via INTRAVENOUS

## 2015-05-08 MED ORDER — ACETAMINOPHEN 650 MG RE SUPP: 650.0000 mg | Freq: Four times a day (QID) | RECTAL | Status: DC | PRN

## 2015-05-08 MED ORDER — RIVAROXABAN 10 MG PO TABS
10.0000 mg | ORAL_TABLET | Freq: Every day | ORAL | Status: DC
  Administered 2015-05-09 – 2015-05-10 (×2): 10 mg via ORAL
  Filled 2015-05-08 (×3): qty 1

## 2015-05-08 MED ORDER — LACTATED RINGERS IV SOLN: INTRAVENOUS | Status: DC

## 2015-05-08 MED ORDER — BUPIVACAINE LIPOSOME 1.3 % IJ SUSP
20.0000 mL | Freq: Once | INTRAMUSCULAR | Status: DC
  Filled 2015-05-08: qty 20

## 2015-05-08 MED ORDER — ONDANSETRON HCL 4 MG/2ML IJ SOLN
INTRAMUSCULAR | Status: AC
  Filled 2015-05-08: qty 2

## 2015-05-08 MED ORDER — THROMBIN 5000 UNITS EX SOLR
CUTANEOUS | Status: DC | PRN
  Administered 2015-05-08: 09:00:00 via TOPICAL

## 2015-05-08 MED ORDER — HYDROCHLOROTHIAZIDE 12.5 MG PO CAPS
12.5000 mg | ORAL_CAPSULE | Freq: Every day | ORAL | Status: DC
  Administered 2015-05-08 – 2015-05-10 (×3): 12.5 mg via ORAL
  Filled 2015-05-08 (×3): qty 1

## 2015-05-08 MED ORDER — TRANEXAMIC ACID 1000 MG/10ML IV SOLN
2000.0000 mg | Freq: Once | INTRAVENOUS | Status: DC
  Filled 2015-05-08: qty 20

## 2015-05-08 MED ORDER — AMLODIPINE BESYLATE 5 MG PO TABS
5.0000 mg | ORAL_TABLET | Freq: Every day | ORAL | Status: DC
  Administered 2015-05-08 – 2015-05-10 (×3): 5 mg via ORAL
  Filled 2015-05-08 (×3): qty 1

## 2015-05-08 MED ORDER — CEFAZOLIN SODIUM 1-5 GM-% IV SOLN
1.0000 g | Freq: Four times a day (QID) | INTRAVENOUS | Status: AC
  Administered 2015-05-08 (×2): 1 g via INTRAVENOUS
  Filled 2015-05-08 (×2): qty 50

## 2015-05-08 MED ORDER — ACETAMINOPHEN 325 MG PO TABS: 650.0000 mg | ORAL_TABLET | Freq: Four times a day (QID) | ORAL | Status: DC | PRN

## 2015-05-08 MED ORDER — PROPOFOL 500 MG/50ML IV EMUL
INTRAVENOUS | Status: DC | PRN
  Administered 2015-05-08: 100 ug/kg/min via INTRAVENOUS

## 2015-05-08 MED ORDER — FENTANYL CITRATE (PF) 100 MCG/2ML IJ SOLN
INTRAMUSCULAR | Status: DC | PRN
  Administered 2015-05-08: 50 ug via INTRAVENOUS

## 2015-05-08 MED ORDER — MIDAZOLAM HCL 2 MG/2ML IJ SOLN
INTRAMUSCULAR | Status: AC
  Filled 2015-05-08: qty 2

## 2015-05-08 MED ORDER — LIDOCAINE HCL (CARDIAC) 20 MG/ML IV SOLN
INTRAVENOUS | Status: DC | PRN
  Administered 2015-05-08: 40 mg via INTRAVENOUS

## 2015-05-08 MED ORDER — BUPIVACAINE LIPOSOME 1.3 % IJ SUSP
INTRAMUSCULAR | Status: DC | PRN
  Administered 2015-05-08: 20 mL

## 2015-05-08 MED ORDER — POLYMYXIN B SULFATE 500000 UNITS IJ SOLR
INTRAMUSCULAR | Status: AC
  Filled 2015-05-08: qty 1

## 2015-05-08 MED ORDER — MENTHOL 3 MG MT LOZG: 1.0000 | LOZENGE | OROMUCOSAL | Status: DC | PRN

## 2015-05-08 MED ORDER — SODIUM CHLORIDE 0.9 % IJ SOLN
INTRAMUSCULAR | Status: DC | PRN
  Administered 2015-05-08: 20 mL

## 2015-05-08 MED ORDER — ALUM & MAG HYDROXIDE-SIMETH 200-200-20 MG/5ML PO SUSP: 30.0000 mL | ORAL | Status: DC | PRN

## 2015-05-08 MED ORDER — SODIUM CHLORIDE 0.9 % IJ SOLN
INTRAMUSCULAR | Status: AC
  Filled 2015-05-08: qty 50

## 2015-05-08 MED ORDER — FENTANYL CITRATE (PF) 100 MCG/2ML IJ SOLN
INTRAMUSCULAR | Status: AC
  Filled 2015-05-08: qty 2

## 2015-05-08 MED ORDER — CELECOXIB 200 MG PO CAPS
200.0000 mg | ORAL_CAPSULE | Freq: Two times a day (BID) | ORAL | Status: DC
  Administered 2015-05-08 – 2015-05-10 (×4): 200 mg via ORAL
  Filled 2015-05-08 (×6): qty 1

## 2015-05-08 MED ORDER — BUPIVACAINE HCL (PF) 0.25 % IJ SOLN
INTRAMUSCULAR | Status: DC | PRN
  Administered 2015-05-08: 20 mL

## 2015-05-08 MED ORDER — ALPRAZOLAM 0.25 MG PO TABS: 0.2500 mg | ORAL_TABLET | Freq: Three times a day (TID) | ORAL | Status: DC | PRN

## 2015-05-08 MED ORDER — HYDROCODONE-ACETAMINOPHEN 5-325 MG PO TABS
1.0000 | ORAL_TABLET | ORAL | Status: DC | PRN
  Administered 2015-05-08 – 2015-05-10 (×7): 1 via ORAL
  Filled 2015-05-08 (×7): qty 1

## 2015-05-08 MED ORDER — FLEET ENEMA 7-19 GM/118ML RE ENEM: 1.0000 | ENEMA | Freq: Once | RECTAL | Status: DC | PRN

## 2015-05-08 MED ORDER — CEFAZOLIN SODIUM-DEXTROSE 2-3 GM-% IV SOLR
2.0000 g | INTRAVENOUS | Status: AC
  Administered 2015-05-08: 2 g via INTRAVENOUS
  Filled 2015-05-08: qty 50

## 2015-05-08 MED ORDER — TRANEXAMIC ACID 1000 MG/10ML IV SOLN
2000.0000 mg | INTRAVENOUS | Status: DC | PRN
  Administered 2015-05-08: 2000 mg via TOPICAL

## 2015-05-08 MED ORDER — OXYCODONE-ACETAMINOPHEN 5-325 MG PO TABS: 2.0000 | ORAL_TABLET | ORAL | Status: DC | PRN

## 2015-05-08 MED ORDER — METHOCARBAMOL 1000 MG/10ML IJ SOLN
500.0000 mg | Freq: Four times a day (QID) | INTRAVENOUS | Status: DC | PRN
  Filled 2015-05-08: qty 5

## 2015-05-08 MED ORDER — HYDROMORPHONE HCL 1 MG/ML IJ SOLN
0.5000 mg | INTRAMUSCULAR | Status: DC | PRN
  Administered 2015-05-08: 0.5 mg via INTRAVENOUS
  Filled 2015-05-08: qty 1

## 2015-05-08 MED ORDER — HYDROMORPHONE HCL 1 MG/ML IJ SOLN: 0.2500 mg | INTRAMUSCULAR | Status: DC | PRN

## 2015-05-08 MED ORDER — ONDANSETRON HCL 4 MG/2ML IJ SOLN
INTRAMUSCULAR | Status: DC | PRN
  Administered 2015-05-08 (×2): 2 mg via INTRAVENOUS

## 2015-05-08 MED ORDER — ONDANSETRON HCL 4 MG PO TABS: 4.0000 mg | ORAL_TABLET | Freq: Four times a day (QID) | ORAL | Status: DC | PRN

## 2015-05-08 MED ORDER — LIDOCAINE HCL (CARDIAC) 20 MG/ML IV SOLN
INTRAVENOUS | Status: AC
  Filled 2015-05-08: qty 5

## 2015-05-08 MED ORDER — DEXTROSE 5 % IV SOLN
10.0000 mg | INTRAVENOUS | Status: DC | PRN
  Administered 2015-05-08: 40 ug/min via INTRAVENOUS

## 2015-05-08 MED ORDER — BUPIVACAINE IN DEXTROSE 0.75-8.25 % IT SOLN
INTRATHECAL | Status: DC | PRN
  Administered 2015-05-08: 2 mL via INTRATHECAL

## 2015-05-08 SURGICAL SUPPLY — 66 items
BAG DECANTER FOR FLEXI CONT (MISCELLANEOUS) IMPLANT
BAG SPEC THK2 15X12 ZIP CLS (MISCELLANEOUS)
BAG ZIPLOCK 12X15 (MISCELLANEOUS) IMPLANT
BANDAGE ACE 4X5 VEL STRL LF (GAUZE/BANDAGES/DRESSINGS) ×3 IMPLANT
BANDAGE ACE 6X5 VEL STRL LF (GAUZE/BANDAGES/DRESSINGS) ×3 IMPLANT
BLADE SAG 18X100X1.27 (BLADE) ×3 IMPLANT
BLADE SAW SGTL 11.0X1.19X90.0M (BLADE) ×3 IMPLANT
BNDG COHESIVE 4X5 TAN NS LF (GAUZE/BANDAGES/DRESSINGS) ×6 IMPLANT
BNDG GAUZE ELAST 4 BULKY (GAUZE/BANDAGES/DRESSINGS) ×2 IMPLANT
BONE CEMENT GENTAMICIN (Cement) ×6 IMPLANT
CAP KNEE TOTAL 3 SIGMA ×2 IMPLANT
CEMENT BONE GENTAMICIN 40 (Cement) ×2 IMPLANT
CLOSURE WOUND 1/2 X4 (GAUZE/BANDAGES/DRESSINGS) ×1
CLOTH BEACON ORANGE TIMEOUT ST (SAFETY) ×3 IMPLANT
CUFF TOURN SGL QUICK 34 (TOURNIQUET CUFF) ×3
CUFF TRNQT CYL 34X4X40X1 (TOURNIQUET CUFF) ×1 IMPLANT
DECANTER SPIKE VIAL GLASS SM (MISCELLANEOUS) ×3 IMPLANT
DRAPE INCISE IOBAN 66X45 STRL (DRAPES) IMPLANT
DRAPE U-SHAPE 47X51 STRL (DRAPES) ×3 IMPLANT
DRSG ADAPTIC 3X8 NADH LF (GAUZE/BANDAGES/DRESSINGS) ×2 IMPLANT
DRSG PAD ABDOMINAL 8X10 ST (GAUZE/BANDAGES/DRESSINGS) ×4 IMPLANT
DURAPREP 26ML APPLICATOR (WOUND CARE) ×3 IMPLANT
ELECT REM PT RETURN 9FT ADLT (ELECTROSURGICAL) ×3
ELECTRODE REM PT RTRN 9FT ADLT (ELECTROSURGICAL) ×1 IMPLANT
EVACUATOR 1/8 PVC DRAIN (DRAIN) ×3 IMPLANT
GAUZE SPONGE 4X4 12PLY STRL (GAUZE/BANDAGES/DRESSINGS) ×3 IMPLANT
GLOVE BIOGEL PI IND STRL 6.5 (GLOVE) ×1 IMPLANT
GLOVE BIOGEL PI IND STRL 8 (GLOVE) ×1 IMPLANT
GLOVE BIOGEL PI INDICATOR 6.5 (GLOVE) ×10
GLOVE BIOGEL PI INDICATOR 8 (GLOVE) ×6
GLOVE ECLIPSE 8.0 STRL XLNG CF (GLOVE) ×6 IMPLANT
GLOVE SURG SS PI 6.5 STRL IVOR (GLOVE) ×3 IMPLANT
GOWN STRL REUS W/TWL LRG LVL3 (GOWN DISPOSABLE) ×5 IMPLANT
GOWN STRL REUS W/TWL XL LVL3 (GOWN DISPOSABLE) ×5 IMPLANT
HANDPIECE INTERPULSE COAX TIP (DISPOSABLE) ×3
IMMOBILIZER KNEE 20 (SOFTGOODS) ×3
IMMOBILIZER KNEE 20 THIGH 36 (SOFTGOODS) ×1 IMPLANT
LIQUID BAND (GAUZE/BANDAGES/DRESSINGS) ×3 IMPLANT
MANIFOLD NEPTUNE II (INSTRUMENTS) ×3 IMPLANT
NDL HYPO 21X1.5 SAFETY (NEEDLE) ×1 IMPLANT
NEEDLE HYPO 21X1.5 SAFETY (NEEDLE) ×3 IMPLANT
NEEDLE HYPO 22GX1.5 SAFETY (NEEDLE) ×3 IMPLANT
NS IRRIG 1000ML POUR BTL (IV SOLUTION) IMPLANT
PACK TOTAL KNEE CUSTOM (KITS) ×3 IMPLANT
PADDING CAST COTTON 6X4 STRL (CAST SUPPLIES) ×2 IMPLANT
PENCIL SMOKE EVAC W/HOLSTER (ELECTROSURGICAL) ×3 IMPLANT
POSITIONER SURGICAL ARM (MISCELLANEOUS) ×3 IMPLANT
SET HNDPC FAN SPRY TIP SCT (DISPOSABLE) ×1 IMPLANT
SET PAD KNEE POSITIONER (MISCELLANEOUS) ×3 IMPLANT
SPONGE LAP 18X18 X RAY DECT (DISPOSABLE) IMPLANT
SPONGE SURGIFOAM ABS GEL 100 (HEMOSTASIS) ×3 IMPLANT
STRIP CLOSURE SKIN 1/2X4 (GAUZE/BANDAGES/DRESSINGS) ×3 IMPLANT
SUT BONE WAX W31G (SUTURE) ×3 IMPLANT
SUT MNCRL AB 4-0 PS2 18 (SUTURE) ×3 IMPLANT
SUT VIC AB 1 CT1 27 (SUTURE) ×6
SUT VIC AB 1 CT1 27XBRD ANTBC (SUTURE) ×2 IMPLANT
SUT VIC AB 2-0 CT1 27 (SUTURE) ×9
SUT VIC AB 2-0 CT1 TAPERPNT 27 (SUTURE) ×3 IMPLANT
SUT VLOC 180 0 24IN GS25 (SUTURE) ×3 IMPLANT
SYR 20CC LL (SYRINGE) ×6 IMPLANT
TOWER CARTRIDGE SMART MIX (DISPOSABLE) ×3 IMPLANT
TRAY FOLEY W/METER SILVER 14FR (SET/KITS/TRAYS/PACK) ×1 IMPLANT
TRAY FOLEY W/METER SILVER 16FR (SET/KITS/TRAYS/PACK) ×3 IMPLANT
WATER STERILE IRR 1500ML POUR (IV SOLUTION) ×3 IMPLANT
WRAP KNEE MAXI GEL POST OP (GAUZE/BANDAGES/DRESSINGS) ×3 IMPLANT
YANKAUER SUCT BULB TIP 10FT TU (MISCELLANEOUS) ×3 IMPLANT

## 2015-05-08 NOTE — Anesthesia Postprocedure Evaluation (Signed)
Anesthesia Post Note  Patient: [name]  Procedure(s) Performed: Procedure(s) (LRB): LEFT TOTAL KNEE ARTHROPLASTY (Left)  Patient location during evaluation: PACU Anesthesia Type: Spinal and MAC Level of consciousness: awake and alert Pain management: pain level controlled Vital Signs Assessment: post-procedure vital signs reviewed and stable Respiratory status: spontaneous breathing and respiratory function stable Cardiovascular status: blood pressure returned to baseline and stable Postop Assessment: spinal receding Anesthetic complications: no    Last Vitals:  Filed Vitals:   05/08/15 1000 05/08/15 1015  BP: 131/76   Pulse: 66   Temp:  36.3 C  Resp: 15 12    Last Pain:  Filed Vitals:   05/08/15 1018  PainSc: 0-No pain    LLE Motor Response: Purposeful movement (05/08/15 1015) LLE Sensation: Full sensation (05/08/15 1015) RLE Motor Response: Purposeful movement (lifts knee off bed) (05/08/15 1015) RLE Sensation: Full sensation (05/08/15 1015) L Sensory Level: S1-Sole of foot, small toes (05/08/15 1015) R Sensory Level: S1-Sole of foot, small toes (05/08/15 1015)  [name],W. EDMOND

## 2015-05-08 NOTE — Anesthesia Procedure Notes (Signed)
Spinal Patient location during procedure: OR Start time: 05/08/2015 7:24 AM End time: 05/08/2015 7:25 AM Reason for block: at surgeon's request Staffing Resident/CRNA: Freddie Breech Performed by: resident/CRNA  Preanesthetic Checklist Completed: patient identified, site marked, surgical consent, pre-op evaluation, timeout performed, IV checked, risks and benefits discussed, monitors and equipment checked and at surgeon's request Spinal Block Patient position: sitting Prep: ChloraPrep Patient monitoring: heart rate, continuous pulse ox and blood pressure Approach: midline Location: L3-4 Injection technique: single-shot Needle Needle type: Sprotte  Needle gauge: 24 G Needle length: 10 cm Assessment Sensory level: T6 Additional Notes Expiration date of kit checked and confirmed. Patient tolerated procedure well, without complications. X 1 attempt with noted clear CSF return. Loss of motor and sensory on exam post injection.  Anesthesiologist present on placement of spinal.

## 2015-05-08 NOTE — Transfer of Care (Signed)
Immediate Anesthesia Transfer of Care Note  Patient: [name]  Procedure(s) Performed: Procedure(s): LEFT TOTAL KNEE ARTHROPLASTY (Left)  Patient Location: PACU  Anesthesia Type:Spinal  Level of Consciousness:  sedated, patient cooperative and responds to stimulation  Airway & Oxygen Therapy:Patient Spontanous Breathing and Patient connected to face mask oxgen  Post-op Assessment:  Report given to PACU RN and Post -op Vital signs reviewed and stable  Post vital signs:  Reviewed and stable  Last Vitals:  Filed Vitals:   05/08/15 0538  BP: 182/62  Pulse: 93  Temp: 36.4 C  Resp: 16    Complications: No apparent anesthesia complications

## 2015-05-08 NOTE — Op Note (Signed)
[name], [name] NO.:  [id number]  MEDICAL RECORD NO.:  [id number]  LOCATION:  [facility]                         FACILITY:  [facility]  PHYSICIAN:  Kipp Brood. Catie Chiao, M.D.DATE OF BIRTH:  09-27-1924  DATE OF PROCEDURE:  05/08/2015 DATE OF DISCHARGE:                              OPERATIVE REPORT   SURGEON:  Kipp Brood. Gladstone Lighter, M.D.  ASSISTANT:  Ardeen Jourdain, [place]  PREOPERATIVE DIAGNOSIS:  Primary osteoarthritis with bone-on-bone, left knee with a flexion contracture.  POSTOPERATIVE DIAGNOSIS:  Primary osteoarthritis with bone-on-bone, left knee with a flexion contracture.  OPERATION:  Left total knee arthroplasty utilizing the DePuy system, all three components were cemented and gentamicin was used in the cement. The sizes used were as follows:  The patella was a size 48 with 3 pegs. Femoral component was a size 4 left posterior cruciate sacrificing type. The tibial tray was a size 4.  The insert was a size 4, 10-mm thickness polyethylene rotating platform.  DESCRIPTION OF PROCEDURE:  Under spinal anesthesia, routine orthopedic prep and draping of the left lower extremity was carried out.  At this time, the patient had 2 g of IV Ancef.  Also, he will be given tranexamic acid within the joint at the end of the procedure.  Note, the appropriate time-out was carried out, I also marked the appropriate left leg in the holding area.  Left leg, after the sterile prep and draping, was exsanguinated with an Esmarch, tourniquet was elevated at 325 mmHg. At this particular time, an anterior approach of the knee was carried out, two flaps were created.  I then carried out the median parapatellar incision, reflected patella laterally and at this time, the knee was flexed and I did medial and lateral meniscectomies.  I also excised the anterior and posterior cruciate ligaments.  I also did a release medially because of the tightness in genu varus deformity, that led  to contractures.  At this time, the initial drill hole was made in the intercondylar notch.  The guide rod was inserted up the canal.  I thoroughly irrigated out the canal.  I then removed 12-mm thickness off the distal femur at this time.  At this time, the distal femur was measured to be a size 4.  At that time, we did our anterior, posterior and chamfering cuts for a size 4 left femoral component.  At this time, after we prepared the distal femur, we went on and made our initial drill hole in the tibial plateau.  We inserted our canal finder, then irrigated out the tibia.  I then inserted our rods for the removal of 6- mm thickness of the tibial plateau measured for the medial side.  We removed all bone spurs as well.  Once this was prepared, we then inserted our lamina spreaders and removed all the spurs and posterior osteophytes from the femoral condyles.  We got a nice release as well. Following that, we then prepared the tibia plateau, made our keel cut in the plateau.  Note, this was all done after we inserted our spacer blocks and had a good fit for size 10.  After we prepared and made our keel cut in the tibia,  we did our notch cut out of the distal femur.  We inserted our trial components, took the knee through motion and had excellent function.  We then did a resurfacing procedure on the patella in the usual fashion.  Three drill holes were made in the articular surface of the patella for a size 41 component.  All trial components were removed.  We thoroughly water picked out the knee and cemented all three components in simultaneously with gentamicin in the cement.  After the cement was hardened, all loose pieces of cement were removed.  We then water picked the knee out again to remove the remaining loose pieces.  I then packed some thrombin-soaked Gelfoam posteriorly.  I inserted my permanent tray, the size 4 tibial tray, polyethylene rotating platform.  I reduced the knee  and had an excellent fit.  The thickness of the polyethylene component was 10 mm thickness.  We then inserted a Hemovac drain and closed the knee in layers in usual fashion.  Prior to closing, we injected a mixture of tranexamic acid in the usual fashion. We then closed the wound over the Hemovac drain.  Skin was closed with subcu closure.          ______________________________ Kipp Brood. Gladstone Lighter, M.D.     RAG/MEDQ  D:  05/08/2015  T:  05/08/2015  Job:  [id number]

## 2015-05-08 NOTE — Interval H&P Note (Signed)
History and Physical Interval Note:  05/08/2015 7:06 AM  [name]  has presented today for surgery, with the diagnosis of OSTEOARTHRITIS OF LEFT KNEE  The various methods of treatment have been discussed with the patient and family. After consideration of risks, benefits and other options for treatment, the patient has consented to  Procedure(s): LEFT TOTAL KNEE ARTHROPLASTY (Left) as a surgical intervention .  The patient's history has been reviewed, patient examined, no change in status, stable for surgery.  I have reviewed the patient's chart and labs.  Questions were answered to the patient's satisfaction.     [name] A

## 2015-05-08 NOTE — Evaluation (Signed)
Physical Therapy Evaluation Patient Details Name: [name] MRN: [id number] DOB: [birth year] Today's Date: 05/08/2015   History of Present Illness  Pt is an 82 year old male s/p L TKA with hx of R TKA (2006)  Clinical Impression  Pt is s/p L TKA resulting in the deficits listed below (see PT Problem List).  Pt will benefit from skilled PT to increase their independence and safety with mobility to allow discharge to the venue listed below.  Pt tolerated 8 feet of ambulation POD #0 with recliner following for safety.  Pt plans to d/c home with spouse.     Follow Up Recommendations Home health PT;Supervision for mobility/OOB    Equipment Recommendations  Rolling walker with 5" wheels    Recommendations for Other Services       Precautions / Restrictions Precautions Precautions: Knee;Fall Required Braces or Orthoses: Knee Immobilizer - Left Restrictions Other Position/Activity Restrictions: WBAT      Mobility  Bed Mobility Overal bed mobility: Needs Assistance Bed Mobility: Supine to Sit     Supine to sit: Min assist     General bed mobility comments: assist for L LE, increased time  Transfers Overall transfer level: Needs assistance Equipment used: Rolling walker (2 wheeled) Transfers: Sit to/from Stand Sit to Stand: Min assist         General transfer comment: verbal cues for UE and LE positioning  Ambulation/Gait Ambulation/Gait assistance: Min assist Ambulation Distance (Feet): 8 Feet Assistive device: Rolling walker (2 wheeled) Gait Pattern/deviations: Step-to pattern;Antalgic;Trunk flexed;Decreased stance time - left     General Gait Details: verbal cues for sequence, RW positioning, posture, step length, distance to tolerance with recliner following for safety, pt reports mild dizziness upon sitting in recliner  Stairs            Wheelchair Mobility    Modified Rankin (Stroke Patients Only)       Balance                                              Pertinent Vitals/Pain Pain Assessment: 0-10 Pain Score: 3  Pain Location: L knee Pain Descriptors / Indicators: Sore;Aching Pain Intervention(s): Limited activity within patient's tolerance;Monitored during session;Repositioned;Premedicated before session;Ice applied    Home Living Family/patient expects to be discharged to:: Private residence Living Arrangements: Spouse/significant other Available Help at Discharge: Family Type of Home: House Home Access: Ramped entrance     [place]: Two level;Able to live on main level with bedroom/bathroom Home Equipment: Cane - single point      Prior Function Level of Independence: Independent               Hand Dominance        Extremity/Trunk Assessment               Lower Extremity Assessment: LLE deficits/detail   LLE Deficits / Details: unable to perform SLR, maintained knee KI     Communication   Communication: No difficulties  Cognition Arousal/Alertness: Awake/alert Behavior During Therapy: WFL for tasks assessed/performed Overall Cognitive Status: Within Functional Limits for tasks assessed                      General Comments      Exercises        Assessment/Plan    PT Assessment Patient needs continued PT services  PT Diagnosis Difficulty walking;Acute pain   PT Problem List Decreased strength;Decreased range of motion;Decreased mobility;Decreased knowledge of precautions;Decreased knowledge of use of DME;Pain  PT Treatment Interventions Functional mobility training;Gait training;DME instruction;Patient/family education;Therapeutic activities;Therapeutic exercise   PT Goals (Current goals can be found in the Care Plan section) Acute Rehab PT Goals PT Goal Formulation: With patient Time For Goal Achievement: 05/15/15 Potential to Achieve Goals: Good    Frequency 7X/week   Barriers to discharge        Co-evaluation               End  of Session Equipment Utilized During Treatment: Gait belt;Left knee immobilizer Activity Tolerance: Patient tolerated treatment well;No increased pain Patient left: in chair;with call bell/phone within reach;with chair alarm set;with family/visitor present           Time: 1415-1430 PT Time Calculation (min) (ACUTE ONLY): 15 min   Charges:   PT Evaluation $PT Eval Low Complexity: 1 Procedure     PT G Codes:        Jamian Andujo,KATHrine E 05/08/2015, 3:58 PM Carmelia Bake, PT, DPT 05/08/2015 Pager: [phone]

## 2015-05-08 NOTE — Brief Op Note (Signed)
05/08/2015  9:21 AM  PATIENT:  [name]  82 y.o. male  PRE-OPERATIVE DIAGNOSIS:Primary  OSTEOARTHRITIS OF LEFT KNEE  POST-OPERATIVE DIAGNOSIS:Primary  OSTEOARTHRITIS OF LEFT KNEE  PROCEDURE:  Procedure(s): LEFT TOTAL KNEE ARTHROPLASTY (Left)  SURGEON:  Surgeon(s) and Role:    * Latanya Maudlin, MD - Primary  PHYSICIAN ASSISTANT: Ardeen Jourdain PA  ASSISTANTS:Amber [place] PA  ANESTHESIA:   spinal  EBL:  Total I/O In: 1000 [I.V.:1000] Out: 850 [Urine:800; Blood:50]  BLOOD ADMINISTERED:none  DRAINS: (One) Hemovact drain(s) in the Left Knee with  Suction Open   LOCAL MEDICATIONS USED:  MARCAINE 20cc of 0.25% Plain and 20cc of Exparel mixed with 20cc of Normal Saline.    SPECIMEN:  No Specimen  DISPOSITION OF SPECIMEN:  N/A  COUNTS:  YES  TOURNIQUET:   Total Tourniquet Time Documented: Thigh (Left) - 87 minutes Total: Thigh (Left) - 87 minutes   DICTATION: .Other Dictation: Dictation Number [id number]  PLAN OF CARE: Admit to inpatient   PATIENT DISPOSITION:  Stable in OR   Delay start of Pharmacological VTE agent (>24hrs) due to surgical blood loss or risk of bleeding: yes

## 2015-05-09 LAB — BASIC METABOLIC PANEL
Anion gap: 7 (ref 5–15)
BUN: 16 mg/dL (ref 6–20)
CHLORIDE: 102 mmol/L (ref 101–111)
CO2: 25 mmol/L (ref 22–32)
Calcium: 8.6 mg/dL — ABNORMAL LOW (ref 8.9–10.3)
Creatinine, Ser: 0.98 mg/dL (ref 0.61–1.24)
GFR calc non Af Amer: >60 mL/min (ref >60)
Glucose, Bld: 129 mg/dL — ABNORMAL HIGH (ref 65–99)
POTASSIUM: 3.9 mmol/L (ref 3.5–5.1)
SODIUM: 134 mmol/L — AB (ref 135–145)

## 2015-05-09 LAB — CBC
HCT: 32.8 % — ABNORMAL LOW (ref 39.0–52.0)
HEMOGLOBIN: 11.3 g/dL — AB (ref 13.0–17.0)
MCH: 33.3 pg (ref 26.0–34.0)
MCHC: 34.5 g/dL (ref 30.0–36.0)
MCV: 96.8 fL (ref 78.0–100.0)
Platelets: 101 10*3/uL — ABNORMAL LOW (ref 150–400)
RBC: 3.39 MIL/uL — ABNORMAL LOW (ref 4.22–5.81)
RDW: 12.6 % (ref 11.5–15.5)
WBC: 8.2 10*3/uL (ref 4.0–10.5)

## 2015-05-09 MED ORDER — ASPIRIN EC 325 MG PO TBEC: 325.0000 mg | DELAYED_RELEASE_TABLET | Freq: Two times a day (BID) | ORAL | Status: DC

## 2015-05-09 MED ORDER — METHOCARBAMOL 500 MG PO TABS: 500.0000 mg | ORAL_TABLET | Freq: Four times a day (QID) | ORAL | Status: DC | PRN

## 2015-05-09 MED ORDER — HYDROCODONE-ACETAMINOPHEN 5-325 MG PO TABS: 1.0000 | ORAL_TABLET | ORAL | Status: DC | PRN

## 2015-05-09 NOTE — Progress Notes (Signed)
Physical Therapy Treatment Patient Details Name: [name] MRN: [id number] DOB: [birth year] Today's Date: 05/09/2015    History of Present Illness Pt is an 82 year old male s/p L TKA with hx of R TKA (2006)    PT Comments    Pt assisted with ambulating hallway this morning and requiring max verbal cues for technique and increased time.  Follow Up Recommendations  Home health PT;Supervision for mobility/OOB     Equipment Recommendations  Rolling walker with 5" wheels    Recommendations for Other Services       Precautions / Restrictions Precautions Precautions: Knee;Fall Required Braces or Orthoses: Knee Immobilizer - Left Restrictions Other Position/Activity Restrictions: WBAT    Mobility  Bed Mobility Overal bed mobility: Needs Assistance Bed Mobility: Supine to Sit     Supine to sit: Min assist     General bed mobility comments: assist for L LE  Transfers Overall transfer level: Needs assistance Equipment used: Rolling walker (2 wheeled) Transfers: Sit to/from Stand Sit to Stand: Min assist         General transfer comment: verbal cues for UE and LE positioning  Ambulation/Gait Ambulation/Gait assistance: Min assist Ambulation Distance (Feet): 40 Feet Assistive device: Rolling walker (2 wheeled) Gait Pattern/deviations: Step-to pattern;Antalgic;Decreased stance time - left     General Gait Details: constant verbal cues for sequence, RW positioning, posture, step length, very slow speed   Stairs            Wheelchair Mobility    Modified Rankin (Stroke Patients Only)       Balance                                    Cognition Arousal/Alertness: Awake/alert Behavior During Therapy: WFL for tasks assessed/performed Overall Cognitive Status: Within Functional Limits for tasks assessed                      Exercises      General Comments        Pertinent Vitals/Pain Pain Assessment: 0-10 Pain Score:  3  Pain Location: L knee Pain Descriptors / Indicators: Sore Pain Intervention(s): Limited activity within patient's tolerance;Monitored during session;Premedicated before session;Repositioned;Ice applied    Home Living Family/patient expects to be discharged to:: Private residence Living Arrangements: Spouse/significant other Available Help at Discharge: Family Type of Home: House Home Access: Ramped entrance   [place]: Two level;Able to live on main level with bedroom/bathroom Home Equipment: [name] - single point      Prior Function Level of Independence: Independent          PT Goals (current goals can now be found in the care plan section) Acute Rehab PT Goals Patient Stated Goal: get home Progress towards PT goals: Progressing toward goals    Frequency  7X/week    PT Plan Current plan remains appropriate    Co-evaluation             End of Session Equipment Utilized During Treatment: Gait belt;Left knee immobilizer Activity Tolerance: Patient tolerated treatment well;No increased pain Patient left: in chair;with call bell/phone within reach;with chair alarm set;with family/visitor present     Time: [id number] PT Time Calculation (min) (ACUTE ONLY): 30 min  Charges:  $Gait Training: 23-37 mins                    G Codes:  [name],[name] 05/09/2015, 1:00 PM [name], PT, DPT 05/09/2015 Pager: [phone]

## 2015-05-09 NOTE — Care Management Note (Addendum)
Case Management Note  Patient Details  Name: JAZEN SPRAGGINS MRN: [id number] Date of Birth: June 07, 1924  Subjective/Objective:   LTKA              Action/Plan: Discharge planning Expected Discharge Date:  05/09/15               Expected Discharge Plan:  Home/Self Care  In-House Referral:     Discharge planning Services  CM Consult  Post Acute Care Choice:    Choice offered to:  Patient  DME Arranged:  3-N-1, Walker rolling DME Agency:  [place]:  NA [place] Agency:  NA  Status of Service:  Completed, signed off  Medicare Important Message Given:    Date Medicare IM Given:    Medicare IM give by:    Date Additional Medicare IM Given:    Additional Medicare Important Message give by:     If discussed at [place] of Stay Meetings, dates discussed:    Additional Comments: Utilization Review complete. CM met with pt to confirm plan of outpt therapy starting 05/13/15; pt confirms.  CM called [facility] DME rep, Lecretia to please deliver the rolling walker and 3n1 to room prior to discharge.  No other CM needs were communicated. Dellie Catholic, RN 05/09/2015, 2:54 PM

## 2015-05-09 NOTE — Progress Notes (Signed)
   Subjective: 1 Day Post-Op Procedure(s) (LRB): LEFT TOTAL KNEE ARTHROPLASTY (Left) Patient reports pain as moderate.   Patient seen in rounds for Dr. Gladstone Lighter Patient is well, and has had no acute complaints or problems other than some discomfort in the left knee. No SOB or chest pain. No issues overnight.    Objective: Vital signs in last 24 hours: Temp:  [97.3 F (36.3 C)-98.5 F (36.9 C)] 98.2 F (36.8 C) (03/23 0525) Pulse Rate:  [54-73] 63 (03/23 0525) Resp:  [12-20] 16 (03/23 0525) BP: (126-156)/(49-87) 139/64 mmHg (03/23 0525) SpO2:  [97 %-100 %] 100 % (03/23 0525)  Intake/Output from previous day:  Intake/Output Summary (Last 24 hours) at 05/09/15 0720 Last data filed at 05/09/15 0526  Gross per 24 hour  Intake 3746.67 ml  Output   3430 ml  Net 316.67 ml     Labs:  Recent Labs  05/09/15 0413  HGB 11.3*    Recent Labs  05/09/15 0413  WBC 8.2  RBC 3.39*  HCT 32.8*  PLT 101*    Recent Labs  05/09/15 0413  NA 134*  K 3.9  CL 102  CO2 25  BUN 16  CREATININE 0.98  GLUCOSE 129*  CALCIUM 8.6*   EXAM General - Patient is Alert and Oriented Extremity - Neurologically intact Intact pulses distally Dorsiflexion/Plantar flexion intact Incision: no drainage No cellulitis present Compartment soft Dressing - no drainage Motor Function - intact, moving foot and toes well on exam.  Hemovac pulled without difficulty.  Past Medical History  Diagnosis Date  . CAD (coronary artery disease)   . Essential and other specified forms of tremor 07/15/2012  . RBBB   . Mobitz type 2 second degree atrioventricular block   . HTN (hypertension) 01/30/2013  . Urinary frequency   . Cancer ([place])     hx of bladder cancer- 2002   . Dementia due to general medical condition without behavioral disturbance     unspecified (per wife) dx at [facility]    Assessment/Plan: 1 Day Post-Op Procedure(s) (LRB): LEFT TOTAL KNEE ARTHROPLASTY (Left) Active Problems:   S/P TKR  (total knee replacement) using cement  Estimated body mass index is 26.06 kg/(m^2) as calculated from the following:   Height as of this encounter: 5' 7.5" (1.715 m).   Weight as of this encounter: 76.658 kg (169 lb). Advance diet Up with therapy D/C IV fluids Plan for discharge tomorrow  DVT Prophylaxis - Xarelto Weight-Bearing as tolerated  D/C O2 and Pulse OX and try on Room Air  He is doing well. DC foley this morning. PT today. Plan for DC home tomorrow.   Ardeen Jourdain, PA-C Orthopaedic Surgery 05/09/2015, 7:20 AM

## 2015-05-09 NOTE — Progress Notes (Signed)
[facility]    [facility] is providing the following services: RW and Commode  If patient discharges after hours, please call [phone].   Linward Headland 05/09/2015, 3:07 PM

## 2015-05-09 NOTE — Discharge Instructions (Signed)

## 2015-05-09 NOTE — Evaluation (Signed)
Occupational Therapy Evaluation Patient Details Name: [name] MRN: [id number] DOB: 1924/09/21 Today's Date: 05/09/2015    History of Present Illness Pt is an 82 year old male s/p L TKA with hx of R TKA (2006)   Clinical Impression   Pt is s/p TKA resulting in the deficits listed below (see OT Problem List).  Pt will benefit from skilled OT to increase their safety and independence with ADL and functional mobility for ADL to facilitate discharge to venue listed below.        Follow Up Recommendations  Home health OT    Equipment Recommendations  None recommended by OT       Precautions / Restrictions Precautions Precautions: Knee;Fall Required Braces or Orthoses: Knee Immobilizer - Left Restrictions Weight Bearing Restrictions: No Other Position/Activity Restrictions: WBAT      Mobility Bed Mobility               General bed mobility comments: pt in chair  Transfers Overall transfer level: Needs assistance Equipment used: Rolling walker (2 wheeled) Transfers: Sit to/from Stand Sit to Stand: Mod assist         General transfer comment: verbal cues for UE and LE positioning         ADL Overall ADL's : Needs assistance/impaired     Grooming: Set up;Sitting               Lower Body Dressing: Maximal assistance;Sit to/from stand;Cueing for sequencing;Cueing for safety                 General ADL Comments: Pt very sleepy. Pts BP 125/53.  Pt feels he is exhausted from PT. Educated in strategies to A with ADL activity . Will perform further ADL activity next OT session               Pertinent Vitals/Pain Pain Assessment: 0-10 Pain Score: 1  Pain Location: L knee Pain Descriptors / Indicators: Sore Pain Intervention(s): Monitored during session;Repositioned;Ice applied     Hand Dominance     Extremity/Trunk Assessment Upper Extremity Assessment Upper Extremity Assessment: Overall WFL for tasks assessed            Communication Communication Communication: No difficulties   Cognition Arousal/Alertness: Awake/alert Behavior During Therapy: WFL for tasks assessed/performed Overall Cognitive Status: Within Functional Limits for tasks assessed                                Home Living Family/patient expects to be discharged to:: Private residence Living Arrangements: Spouse/significant other Available Help at Discharge: Family Type of Home: House Home Access: Ramped entrance     Home Layout: Two level;Able to live on main level with bedroom/bathroom     Bathroom Shower/Tub: Walk-in shower         Home Equipment: [name] - single point          Prior Functioning/Environment Level of Independence: Independent             OT Diagnosis: Generalized weakness   OT Problem List: Decreased strength   OT Treatment/Interventions: Self-care/ADL training;DME and/or AE instruction;Patient/family education    OT Goals(Current goals can be found in the care plan section) Acute Rehab OT Goals Patient Stated Goal: get home Time For Goal Achievement: 05/23/15 Potential to Achieve Goals: Good ADL Goals Pt Will Perform Grooming: with supervision;standing Pt Will Perform Lower Body Dressing: with supervision;sit to/from stand Pt Will Transfer to  Toilet: ambulating;regular height toilet Pt Will Perform Toileting - Clothing Manipulation and hygiene: with supervision;sit to/from stand Pt Will Perform Tub/Shower Transfer: Shower transfer;ambulating  OT Frequency: Min 2X/week   Barriers to D/C:            Co-evaluation              End of Session Nurse Communication: Mobility status  Activity Tolerance: Patient tolerated treatment well Patient left: in chair   T Coppell, Thereasa Parkin 05/09/2015, 11:43 AM

## 2015-05-09 NOTE — Progress Notes (Signed)
Physical Therapy Treatment Note    05/09/15 1500  PT Visit Information  Last PT Received On 05/09/15  Assistance Needed +1  History of Present Illness Pt is an 82 year old male s/p L TKA with hx of R TKA (2006)  PT Time Calculation  PT Start Time (ACUTE ONLY) 1332  PT Stop Time (ACUTE ONLY) 1404  PT Time Calculation (min) (ACUTE ONLY) 32 min  Subjective Data  Subjective Pt performed LE exercsies and ambulated again in hallway.  pt requiring increased time for mobility at this time as well as constant cues.  Recommend pt initially receive HHPT.  Precautions  Precautions Knee;Fall  Required Braces or Orthoses Knee Immobilizer - Left  Restrictions  Other Position/Activity Restrictions WBAT  Pain Assessment  Pain Assessment 0-10  Pain Score 3  Pain Location L knee  Pain Descriptors / Indicators Sore  Pain Intervention(s) Limited activity within patient's tolerance;Monitored during session;Repositioned;Ice applied  Cognition  Arousal/Alertness Awake/alert  Behavior During Therapy WFL for tasks assessed/performed  Overall Cognitive Status Within Functional Limits for tasks assessed  Transfers  Overall transfer level Needs assistance  Equipment used Rolling walker (2 wheeled)  Transfers Sit to/from Stand  Sit to Stand Min assist  General transfer comment verbal cues for UE and LE positioning  Ambulation/Gait  Ambulation/Gait assistance Min guard  Ambulation Distance (Feet) 40 Feet  Assistive device Rolling walker (2 wheeled)  Gait Pattern/deviations Step-to pattern;Decreased stance time - left;Antalgic;Trunk flexed  General Gait Details constant verbal cues for sequence, RW positioning, posture, step length, very slow speed  Exercises  Exercises Total Joint  Total Joint Exercises  Ankle Circles/Pumps AROM;Both;10 reps  Quad Sets AROM;10 reps;Left  Short Arc Quad AAROM;Left;10 reps  Heel Slides AAROM;10 reps;Left  Hip ABduction/ADduction AAROM;Left;10 reps  Straight Leg  Raises AAROM;Left;10 reps  Goniometric ROM approx 45* AAROM L knee flexion during heel slides  PT - End of Session  Equipment Utilized During Treatment Gait belt;Left knee immobilizer  Activity Tolerance Patient tolerated treatment well;No increased pain  Patient left in chair;with call bell/phone within reach;with family/visitor present  PT - Assessment/Plan  PT Plan Current plan remains appropriate  PT Frequency (ACUTE ONLY) 7X/week  Follow Up Recommendations Home health PT;Supervision for mobility/OOB  PT equipment Rolling walker with 5" wheels  PT Goal Progression  Progress towards PT goals Progressing toward goals  PT General Charges  $$ ACUTE PT VISIT 1 Procedure  PT Treatments  $Gait Training 8-22 mins  $Therapeutic Exercise 8-22 mins   Carmelia Bake, PT, DPT 05/09/2015 Pager: [phone]

## 2015-05-10 LAB — BASIC METABOLIC PANEL
Anion gap: 3 — ABNORMAL LOW (ref 5–15)
BUN: 21 mg/dL — ABNORMAL HIGH (ref 6–20)
CHLORIDE: 104 mmol/L (ref 101–111)
CO2: 26 mmol/L (ref 22–32)
Calcium: 8.5 mg/dL — ABNORMAL LOW (ref 8.9–10.3)
Creatinine, Ser: 1.13 mg/dL (ref 0.61–1.24)
GFR, EST NON AFRICAN AMERICAN: 56 mL/min — AB (ref >60)
Glucose, Bld: 120 mg/dL — ABNORMAL HIGH (ref 65–99)
POTASSIUM: 4 mmol/L (ref 3.5–5.1)
SODIUM: 133 mmol/L — AB (ref 135–145)

## 2015-05-10 LAB — CBC
HCT: 31.1 % — ABNORMAL LOW (ref 39.0–52.0)
HEMOGLOBIN: 10.7 g/dL — AB (ref 13.0–17.0)
MCH: 33.9 pg (ref 26.0–34.0)
MCHC: 34.4 g/dL (ref 30.0–36.0)
MCV: 98.4 fL (ref 78.0–100.0)
PLATELETS: 91 10*3/uL — AB (ref 150–400)
RBC: 3.16 MIL/uL — AB (ref 4.22–5.81)
RDW: 12.6 % (ref 11.5–15.5)
WBC: 7.7 10*3/uL (ref 4.0–10.5)

## 2015-05-10 NOTE — Progress Notes (Signed)
Physical Therapy Treatment Patient Details Name: [name] MRN: [id number] DOB: 1925/01/21 Today's Date: May 31, 2015    History of Present Illness Pt is an 82 year old male s/p L TKA with hx of R TKA (2006)    PT Comments    Pt's mobility improved today compared to yesterday and pt requiring less verbal cues.    Follow Up Recommendations  Home health PT;Supervision for mobility/OOB     Equipment Recommendations  Rolling walker with 5" wheels    Recommendations for Other Services       Precautions / Restrictions Precautions Precautions: Knee;Fall Required Braces or Orthoses: Knee Immobilizer - Left Restrictions Other Position/Activity Restrictions: WBAT    Mobility  Bed Mobility               General bed mobility comments: pt up in recliner on arrival  Transfers Overall transfer level: Needs assistance Equipment used: Rolling walker (2 wheeled) Transfers: Sit to/from Stand Sit to Stand: Min guard         General transfer comment: pt self positioned safely, increased time but performed without assist  Ambulation/Gait Ambulation/Gait assistance: Min guard Ambulation Distance (Feet): 80 Feet Assistive device: Rolling walker (2 wheeled) Gait Pattern/deviations: Step-to pattern;Antalgic;Decreased stance time - left     General Gait Details: min verbal cues for sequence, RW positioning, posture, improved speed today yet still slow   Science writer    Modified Rankin (Stroke Patients Only)       Balance                                    Cognition Arousal/Alertness: Awake/alert Behavior During Therapy: WFL for tasks assessed/performed Overall Cognitive Status: Within Functional Limits for tasks assessed                      Exercises Total Joint Exercises Ankle Circles/Pumps: AROM;Both;10 reps Quad Sets: AROM;10 reps;Left Short Arc Quad: AAROM;Left;10 reps Heel Slides: AAROM;10  reps;Left Hip ABduction/ADduction: AAROM;Left;10 reps Straight Leg Raises: AAROM;Left;10 reps    General Comments        Pertinent Vitals/Pain Pain Assessment: 0-10 Pain Score: 3  Pain Location: L knee Pain Descriptors / Indicators: Sore;Aching Pain Intervention(s): Limited activity within patient's tolerance;Monitored during session;Repositioned;Premedicated before session    Home Living                      Prior Function            PT Goals (current goals can now be found in the care plan section) Progress towards PT goals: Progressing toward goals    Frequency  7X/week    PT Plan Current plan remains appropriate    Co-evaluation             End of Session Equipment Utilized During Treatment: Gait belt;Left knee immobilizer Activity Tolerance: Patient tolerated treatment well;No increased pain Patient left: in chair;with call bell/phone within reach;with family/visitor present;with chair alarm set     Time: 1047-1110 PT Time Calculation (min) (ACUTE ONLY): 23 min  Charges:  $Gait Training: 8-22 mins $Therapeutic Exercise: 8-22 mins                    G Codes:      Kelsen Celona,KATHrine E 31-May-2015, 2:02 PM Carmelia Bake, PT, DPT May 31, 2015 Pager: [phone]

## 2015-05-10 NOTE — Progress Notes (Signed)
Subjective: 2 Days Post-Op Procedure(s) (LRB): LEFT TOTAL KNEE ARTHROPLASTY (Left) Patient reports pain as 1 on 0-10 scale. Doing well today.Will DC   Objective: Vital signs in last 24 hours: Temp:  [97.4 F (36.3 C)-99.2 F (37.3 C)] 98.8 F (37.1 C) (03/24 0535) Pulse Rate:  [70-82] 82 (03/24 0535) Resp:  [15-16] 15 (03/24 0535) BP: (126-142)/(48-70) 130/56 mmHg (03/24 0535) SpO2:  [95 %-100 %] 95 % (03/24 0535)  Intake/Output from previous day: 03/23 0701 - 03/24 0700 In: 1320 [P.O.:1320] Out: 840 [Urine:840] Intake/Output this shift:     Recent Labs  05/09/15 0413 05/10/15 0406  HGB 11.3* 10.7*    Recent Labs  05/09/15 0413 05/10/15 0406  WBC 8.2 7.7  RBC 3.39* 3.16*  HCT 32.8* 31.1*  PLT 101* 91*    Recent Labs  05/09/15 0413 05/10/15 0406  NA 134* 133*  K 3.9 4.0  CL 102 104  CO2 25 26  BUN 16 21*  CREATININE 0.98 1.13  GLUCOSE 129* 120*  CALCIUM 8.6* 8.5*   No results for input(s): LABPT, INR in the last 72 hours.  Neurologically intact No cellulitis present  Assessment/Plan: 2 Days Post-Op Procedure(s) (LRB): LEFT TOTAL KNEE ARTHROPLASTY (Left) Up with therapy Discharge home with home health  Elea Holtzclaw A 05/10/2015, 7:48 AM

## 2015-05-10 NOTE — Progress Notes (Signed)
Occupational Therapy Treatment Patient Details Name: CAULDER GOTZ MRN: [id number] DOB: 1924/03/24 Today's Date: 05/10/2015    History of present illness Pt is an 82 year old male s/p L TKA with hx of R TKA (2006)   OT comments  Patient progressing well towards OT goals. Continue OT plan of care.  Follow Up Recommendations  Home health OT;Supervision/Assistance - 24 hour    Equipment Recommendations  3 in 1 bedside comode    Recommendations for Other Services      Precautions / Restrictions Precautions Precautions: Knee;Fall Restrictions Weight Bearing Restrictions: No Other Position/Activity Restrictions: WBAT       Mobility Bed Mobility Overal bed mobility: Needs Assistance Bed Mobility: Supine to Sit     Supine to sit: Supervision     General bed mobility comments: able to manage LLE on his own with increased time  Transfers Overall transfer level: Needs assistance Equipment used: Rolling walker (2 wheeled) Transfers: Sit to/from Stand Sit to Stand: Min guard         General transfer comment: verbal cues for UE and LE positioning    Balance                                   ADL Overall ADL's : Needs assistance/impaired Eating/Feeding: Set up;Sitting   Grooming: Set up;Sitting                   Toilet Transfer: Min guard;Ambulation;Regular Toilet;BSC;RW   Toileting- Water quality scientist and Hygiene: Min guard;Sit to/from stand   Tub/ Shower Transfer: Walk-in shower;Min guard;3 in 1;Ambulation;Rolling walker   Functional mobility during ADLs: Min guard;Rolling walker General ADL Comments: Patient worked on bed mobility, ambulation with RW to bathroom, and practiced toilet and shower transfers. Needed cues for hand placement and technique. Wife pleased with his progress. Up to chair at end of session.      Vision                     Perception     Praxis      Cognition   Behavior During Therapy: WFL for  tasks assessed/performed Overall Cognitive Status: Within Functional Limits for tasks assessed                       Extremity/Trunk Assessment               Exercises     Shoulder Instructions       General Comments      Pertinent Vitals/ Pain       Pain Assessment: No/denies pain  Home Living                                          Prior Functioning/Environment              Frequency Min 2X/week     Progress Toward Goals  OT Goals(current goals can now be found in the care plan section)  Progress towards OT goals: Progressing toward goals     Plan Discharge plan remains appropriate    Co-evaluation                 End of Session Equipment Utilized During Treatment: Rolling walker   Activity Tolerance Patient tolerated treatment well   Patient  Left in chair;with call bell/phone within reach;with chair alarm set;with family/visitor present   Nurse Communication Mobility status        Time: [id number] OT Time Calculation (min): 33 min  Charges: OT General Charges $OT Visit: 1 Procedure OT Treatments $Self Care/Home Management : 23-37 mins  Salinda Snedeker A 05/10/2015, 9:31 AM

## 2015-05-10 NOTE — Progress Notes (Signed)
Pt to d/c home. DME (3n1 & RW) delivered to room prior to d/c. AVS reviewed and "My Chart" discussed with pt. Pt capable of verbalizing medications, dressing changes, signs and symptoms of infection, and follow-up appointments. Remains hemodynamically stable. No signs and symptoms of distress. Educated pt to return to ER in the case of SOB, dizziness, or chest pain.

## 2015-05-10 NOTE — Progress Notes (Signed)
Physical Therapy Treatment Note    05/10/15 1600  PT Visit Information  Last PT Received On 05/10/15  Assistance Needed +1  History of Present Illness Pt is an 82 year old male s/p L TKA with hx of R TKA (2006)  PT Time Calculation  PT Start Time (ACUTE ONLY) 1411  PT Stop Time (ACUTE ONLY) 1427  PT Time Calculation (min) (ACUTE ONLY) 16 min  Subjective Data  Subjective Pt ambulated good distance in hallway and not requiring cues.  Pt reports pain controlled.  Provided pt with HEP handout this morning.  Pt and spouse had no further questions and feel ready for d/c home.  Recommended pt have another person ambulate behind pt for ramp into home for safety and spouse says she has that set up.  Precautions  Precautions Knee;Fall  Required Braces or Orthoses Knee Immobilizer - Left  Restrictions  Other Position/Activity Restrictions WBAT  Pain Assessment  Pain Assessment 0-10  Pain Score 2  Pain Location L knee  Pain Descriptors / Indicators Sore  Pain Intervention(s) Limited activity within patient's tolerance;Monitored during session;Repositioned  Cognition  Arousal/Alertness Awake/alert  Behavior During Therapy WFL for tasks assessed/performed  Overall Cognitive Status Within Functional Limits for tasks assessed  Bed Mobility  General bed mobility comments pt up in recliner on arrival  Transfers  Overall transfer level Needs assistance  Equipment used Rolling walker (2 wheeled)  Transfers Sit to/from Stand  Sit to Stand Supervision  Ambulation/Gait  Ambulation/Gait assistance Min guard;Supervision  Ambulation Distance (Feet) 140 Feet  Assistive device Rolling walker (2 wheeled)  Gait Pattern/deviations Step-to pattern;Antalgic;Decreased stance time - left  General Gait Details pt able to perform proper technique, overall speed improved however still decreased  PT - End of Session  Equipment Utilized During Treatment Left knee immobilizer  Activity Tolerance Patient tolerated  treatment well;No increased pain  Patient left in chair;with call bell/phone within reach;with family/visitor present;with chair alarm set  PT - Assessment/Plan  PT Plan Current plan remains appropriate  PT Frequency (ACUTE ONLY) 7X/week  Follow Up Recommendations Home health PT;Supervision for mobility/OOB  PT equipment Rolling walker with 5" wheels  PT Goal Progression  Progress towards PT goals Progressing toward goals  PT General Charges  $$ ACUTE PT VISIT 1 Procedure  PT Treatments  $Gait Training 8-22 mins   Carmelia Bake, PT, DPT 05/10/2015 Pager: [phone]

## 2015-05-12 NOTE — Discharge Summary (Signed)
Physician Discharge Summary   Patient ID: [name] MRN: [id number] DOB/AGE: 1924/03/29 82 y.o.  Admit date: 05/08/2015 Discharge date: 05/10/2015  Primary Diagnosis: Primary osteoarthritis left knee  Admission Diagnoses:  Past Medical History  Diagnosis Date  . CAD (coronary artery disease)   . Essential and other specified forms of tremor 07/15/2012  . RBBB   . Mobitz type 2 second degree atrioventricular block   . HTN (hypertension) 01/30/2013  . Urinary frequency   . Cancer ([place])     hx of bladder cancer- 2002   . Dementia due to general medical condition without behavioral disturbance     unspecified (per wife) dx at [facility]   Discharge Diagnoses:   Active Problems:   S/P TKR (total knee replacement) using cement  Estimated body mass index is 26.06 kg/(m^2) as calculated from the following:   Height as of this encounter: 5' 7.5" (1.715 m).   Weight as of this encounter: 76.658 kg (169 lb).  Procedure:  Procedure(s) (LRB): LEFT TOTAL KNEE ARTHROPLASTY (Left)   Consults: None  HPI: [name], 82 y.o. male, has a history of pain and functional disability in the left knee due to arthritis and has failed non-surgical conservative treatments for greater than 12 weeks to includeNSAID's and/or analgesics, corticosteriod injections, flexibility and strengthening excercises, use of assistive devices and activity modification. Onset of symptoms was gradual, starting >10 years ago with gradually worsening course since that time. The patient noted no past surgery on the left knee(s). Patient currently rates pain in the left knee(s) at 8 out of 10 with activity. Patient has night pain, worsening of pain with activity and weight bearing, pain that interferes with activities of daily living, pain with passive range of motion, crepitus and joint swelling. Patient has evidence of periarticular osteophytes, joint subluxation and joint space narrowing by imaging studies. There  is no active infection.  Laboratory Data: Admission on 05/08/2015, Discharged on 05/10/2015  Component Date Value Ref Range Status  . WBC 05/09/2015 8.2  4.0 - 10.5 K/uL Final  . RBC 05/09/2015 3.39* 4.22 - 5.81 MIL/uL Final  . Hemoglobin 05/09/2015 11.3* 13.0 - 17.0 g/dL Final  . HCT 05/09/2015 32.8* 39.0 - 52.0 % Final  . MCV 05/09/2015 96.8  78.0 - 100.0 fL Final  . MCH 05/09/2015 33.3  26.0 - 34.0 pg Final  . MCHC 05/09/2015 34.5  30.0 - 36.0 g/dL Final  . RDW 05/09/2015 12.6  11.5 - 15.5 % Final  . Platelets 05/09/2015 101* 150 - 400 K/uL Final   Comment: SPECIMEN CHECKED FOR CLOTS PLATELET COUNT CONFIRMED BY SMEAR   . Sodium 05/09/2015 134* 135 - 145 mmol/L Final  . Potassium 05/09/2015 3.9  3.5 - 5.1 mmol/L Final  . Chloride 05/09/2015 102  101 - 111 mmol/L Final  . CO2 05/09/2015 25  22 - 32 mmol/L Final  . Glucose, Bld 05/09/2015 129* 65 - 99 mg/dL Final  . BUN 05/09/2015 16  6 - 20 mg/dL Final  . Creatinine, Ser 05/09/2015 0.98  0.61 - 1.24 mg/dL Final  . Calcium 05/09/2015 8.6* 8.9 - 10.3 mg/dL Final  . GFR calc non Af Amer 05/09/2015 >60  >60 mL/min Final  . GFR calc Af Amer 05/09/2015 >60  >60 mL/min Final   Comment: (NOTE) The eGFR has been calculated using the CKD EPI equation. This calculation has not been validated in all clinical situations. eGFR's persistently <60 mL/min signify possible Chronic Kidney Disease.   Georgiann Hahn gap 05/09/2015  7  5 - 15 Final  . WBC 05/10/2015 7.7  4.0 - 10.5 K/uL Final  . RBC 05/10/2015 3.16* 4.22 - 5.81 MIL/uL Final  . Hemoglobin 05/10/2015 10.7* 13.0 - 17.0 g/dL Final  . HCT 05/10/2015 31.1* 39.0 - 52.0 % Final  . MCV 05/10/2015 98.4  78.0 - 100.0 fL Final  . MCH 05/10/2015 33.9  26.0 - 34.0 pg Final  . MCHC 05/10/2015 34.4  30.0 - 36.0 g/dL Final  . RDW 05/10/2015 12.6  11.5 - 15.5 % Final  . Platelets 05/10/2015 91* 150 - 400 K/uL Final   CONSISTENT WITH PREVIOUS RESULT  . Sodium 05/10/2015 133* 135 - 145 mmol/L Final  .  Potassium 05/10/2015 4.0  3.5 - 5.1 mmol/L Final  . Chloride 05/10/2015 104  101 - 111 mmol/L Final  . CO2 05/10/2015 26  22 - 32 mmol/L Final  . Glucose, Bld 05/10/2015 120* 65 - 99 mg/dL Final  . BUN 05/10/2015 21* 6 - 20 mg/dL Final  . Creatinine, Ser 05/10/2015 1.13  0.61 - 1.24 mg/dL Final  . Calcium 05/10/2015 8.5* 8.9 - 10.3 mg/dL Final  . GFR calc non Af Amer 05/10/2015 56* >60 mL/min Final  . GFR calc Af Amer 05/10/2015 >60  >60 mL/min Final   Comment: (NOTE) The eGFR has been calculated using the CKD EPI equation. This calculation has not been validated in all clinical situations. eGFR's persistently <60 mL/min signify possible Chronic Kidney Disease.   Georgiann Hahn gap 05/10/2015 3* 5 - 15 Final  Hospital Outpatient Visit on 05/01/2015  Component Date Value Ref Range Status  . aPTT 05/01/2015 35  24 - 37 seconds Final  . WBC 05/01/2015 3.8* 4.0 - 10.5 K/uL Final  . RBC 05/01/2015 4.03* 4.22 - 5.81 MIL/uL Final  . Hemoglobin 05/01/2015 13.4  13.0 - 17.0 g/dL Final  . HCT 05/01/2015 39.5  39.0 - 52.0 % Final  . MCV 05/01/2015 98.0  78.0 - 100.0 fL Final  . MCH 05/01/2015 33.3  26.0 - 34.0 pg Final  . MCHC 05/01/2015 33.9  30.0 - 36.0 g/dL Final  . RDW 05/01/2015 12.8  11.5 - 15.5 % Final  . Platelets 05/01/2015 144* 150 - 400 K/uL Final  . Neutrophils Relative % 05/01/2015 48   Final  . Neutro Abs 05/01/2015 1.8  1.7 - 7.7 K/uL Final  . Lymphocytes Relative 05/01/2015 37   Final  . Lymphs Abs 05/01/2015 1.4  0.7 - 4.0 K/uL Final  . Monocytes Relative 05/01/2015 11   Final  . Monocytes Absolute 05/01/2015 0.4  0.1 - 1.0 K/uL Final  . Eosinophils Relative 05/01/2015 4   Final  . Eosinophils Absolute 05/01/2015 0.2  0.0 - 0.7 K/uL Final  . Basophils Relative 05/01/2015 0   Final  . Basophils Absolute 05/01/2015 0.0  0.0 - 0.1 K/uL Final  . Sodium 05/01/2015 144  135 - 145 mmol/L Final  . Potassium 05/01/2015 4.2  3.5 - 5.1 mmol/L Final  . Chloride 05/01/2015 110  101 - 111  mmol/L Final  . CO2 05/01/2015 24  22 - 32 mmol/L Final  . Glucose, Bld 05/01/2015 110* 65 - 99 mg/dL Final  . BUN 05/01/2015 22* 6 - 20 mg/dL Final  . Creatinine, Ser 05/01/2015 1.18  0.61 - 1.24 mg/dL Final  . Calcium 05/01/2015 9.6  8.9 - 10.3 mg/dL Final  . Total Protein 05/01/2015 7.5  6.5 - 8.1 g/dL Final  . Albumin 05/01/2015 4.2  3.5 - 5.0 g/dL Final  .  AST 05/01/2015 25  15 - 41 U/L Final  . ALT 05/01/2015 13* 17 - 63 U/L Final  . Alkaline Phosphatase 05/01/2015 51  38 - 126 U/L Final  . Total Bilirubin 05/01/2015 0.9  0.3 - 1.2 mg/dL Final  . GFR calc non Af Amer 05/01/2015 53* >60 mL/min Final  . GFR calc Af Amer 05/01/2015 >60  >60 mL/min Final   Comment: (NOTE) The eGFR has been calculated using the CKD EPI equation. This calculation has not been validated in all clinical situations. eGFR's persistently <60 mL/min signify possible Chronic Kidney Disease.   . Anion gap 05/01/2015 10  5 - 15 Final  . Prothrombin Time 05/01/2015 14.0  11.6 - 15.2 seconds Final  . INR 05/01/2015 1.06  0.00 - 1.49 Final  . ABO/RH(D) 05/01/2015 O POS   Final  . Antibody Screen 05/01/2015 NEG   Final  . Sample Expiration 05/01/2015 05/11/2015   Final  . Extend sample reason 05/01/2015 NO TRANSFUSIONS OR PREGNANCY IN THE PAST 3 MONTHS   Final  . Color, Urine 05/01/2015 YELLOW  YELLOW Final  . APPearance 05/01/2015 CLEAR  CLEAR Final  . Specific Gravity, Urine 05/01/2015 1.020  1.005 - 1.030 Final  . pH 05/01/2015 5.5  5.0 - 8.0 Final  . Glucose, UA 05/01/2015 NEGATIVE  NEGATIVE mg/dL Final  . Hgb urine dipstick 05/01/2015 NEGATIVE  NEGATIVE Final  . Bilirubin Urine 05/01/2015 NEGATIVE  NEGATIVE Final  . Ketones, ur 05/01/2015 NEGATIVE  NEGATIVE mg/dL Final  . Protein, ur 05/01/2015 NEGATIVE  NEGATIVE mg/dL Final  . Nitrite 05/01/2015 NEGATIVE  NEGATIVE Final  . Leukocytes, UA 05/01/2015 NEGATIVE  NEGATIVE Final   MICROSCOPIC NOT DONE ON URINES WITH NEGATIVE PROTEIN, BLOOD, LEUKOCYTES,  NITRITE, OR GLUCOSE <1000 mg/dL.  Marland Kitchen MRSA, PCR 05/01/2015 NEGATIVE  NEGATIVE Final  . Staphylococcus aureus 05/01/2015 NEGATIVE  NEGATIVE Final   Comment:        The Xpert SA Assay (FDA approved for NASAL specimens in patients over 28 years of age), is one component of a comprehensive surveillance program.  Test performance has been validated by [facility] for patients greater than or equal to 26 year old. It is not intended to diagnose infection nor to guide or monitor treatment.   . ABO/RH(D) 05/01/2015 O POS   Final     X-Rays:Dg Chest 2 View  05/01/2015  CLINICAL DATA:  Preoperative examination. Patient for knee replacement. Initial encounter. EXAM: CHEST  2 VIEW COMPARISON:  PA and lateral chest 06/02/2007. FINDINGS: The lungs are clear. The patient is status post CABG. Eventration of the right hemidiaphragm is unchanged. Heart size is upper normal. No pneumothorax or pleural effusion. IMPRESSION: No acute disease. Electronically Signed   By: Inge Rise M.D.   On: 05/01/2015 14:24    EKG: Orders placed or performed during the hospital encounter of 05/01/15  . EKG  . EKG     Hospital Course: [name] is a 82 y.o. who was admitted to [facility]. They were brought to the operating room on 05/08/2015 and underwent Procedure(s): LEFT TOTAL KNEE ARTHROPLASTY.  Patient tolerated the procedure well and was later transferred to the recovery room and then to the orthopaedic floor for postoperative care.  They were given PO and IV analgesics for pain control following their surgery.  They were given 24 hours of postoperative antibiotics of  Anti-infectives    Start     Dose/Rate Route Frequency Ordered Stop   05/08/15 1400  ceFAZolin (  ANCEF) IVPB 1 g/50 mL premix     1 g 100 mL/hr over 30 Minutes Intravenous Every 6 hours 05/08/15 1109 05/08/15 2108   05/08/15 0809  polymyxin B 500,000 Units, bacitracin 50,000 Units in sodium chloride irrigation 0.9 % 500 mL  irrigation  Status:  Discontinued       As needed 05/08/15 0809 05/08/15 0936   05/08/15 0538  ceFAZolin (ANCEF) IVPB 2 g/50 mL premix     2 g 100 mL/hr over 30 Minutes Intravenous On call to O.R. 05/08/15 1414 05/08/15 0741     and started on DVT prophylaxis in the form of Xarelto.   PT and OT were ordered for total joint protocol.  Discharge planning consulted to help with postop disposition and equipment needs.  Patient had a good night on the evening of surgery.  They started to get up OOB with therapy on day one. Hemovac drain was pulled without difficulty.  Continued to work with therapy into day two.  Dressing was changed on day two and the incision was clean and dry.  The patient had progressed with therapy and meeting their goals.  Incision was healing well.  Patient was seen in rounds and was ready to go home.   Diet: Cardiac diet Activity:WBAT Follow-up:in 2 weeks Disposition - Home Discharged Condition: stable   Discharge Instructions    Call MD / Call 911    Complete by:  As directed   If you experience chest pain or shortness of breath, CALL 911 and be transported to the hospital emergency room.  If you develope a fever above 101 F, pus (white drainage) or increased drainage or redness at the wound, or calf pain, call your surgeon's office.     Constipation Prevention    Complete by:  As directed   Drink plenty of fluids.  Prune juice may be helpful.  You may use a stool softener, such as Colace (over the counter) 100 mg twice a day.  Use MiraLax (over the counter) for constipation as needed.     Diet - low sodium heart healthy    Complete by:  As directed      Discharge instructions    Complete by:  As directed   INSTRUCTIONS AFTER JOINT REPLACEMENT   Remove items at home which could result in a fall. This includes throw rugs or furniture in walking pathways ICE to the affected joint every three hours while awake for 30 minutes at a time, for at least the first 3-5 days,  and then as needed for pain and swelling.  Continue to use ice for pain and swelling. You may notice swelling that will progress down to the foot and ankle.  This is normal after surgery.  Elevate your leg when you are not up walking on it.   Continue to use the breathing machine you got in the hospital (incentive spirometer) which will help keep your temperature down.  It is common for your temperature to cycle up and down following surgery, especially at night when you are not up moving around and exerting yourself.  The breathing machine keeps your lungs expanded and your temperature down.   DIET:  As you were doing prior to hospitalization, we recommend a well-balanced diet.  DRESSING / WOUND CARE / SHOWERING  You may change your dressing every day with sterile gauze.  Please use good hand washing techniques before changing the dressing.  Do not use any lotions or creams on the incision until instructed  by your surgeon.  ACTIVITY  Increase activity slowly as tolerated, but follow the weight bearing instructions below.   No driving for 6 weeks or until further direction given by your physician.  You cannot drive while taking narcotics.  No lifting or carrying greater than 10 lbs. until further directed by your surgeon. Avoid periods of inactivity such as sitting longer than an hour when not asleep. This helps prevent blood clots.  You may return to work once you are authorized by your doctor.     WEIGHT BEARING   Weight bearing as tolerated with assist device (walker, cane, etc) as directed, use it as long as suggested by your surgeon or therapist, typically at least 4-6 weeks.   EXERCISES  Results after joint replacement surgery are often greatly improved when you follow the exercise, range of motion and muscle strengthening exercises prescribed by your doctor. Safety measures are also important to protect the joint from further injury. Any time any of these exercises cause you to have  increased pain or swelling, decrease what you are doing until you are comfortable again and then slowly increase them. If you have problems or questions, call your caregiver or physical therapist for advice.   Rehabilitation is important following a joint replacement. After just a few days of immobilization, the muscles of the leg can become weakened and shrink (atrophy).  These exercises are designed to build up the tone and strength of the thigh and leg muscles and to improve motion. Often times heat used for twenty to thirty minutes before working out will loosen up your tissues and help with improving the range of motion but do not use heat for the first two weeks following surgery (sometimes heat can increase post-operative swelling).   These exercises can be done on a training (exercise) mat, on the floor, on a table or on a bed. Use whatever works the best and is most comfortable for you.    Use music or television while you are exercising so that the exercises are a pleasant break in your day. This will make your life better with the exercises acting as a break in your routine that you can look forward to.   Perform all exercises about fifteen times, three times per day or as directed.  You should exercise both the operative leg and the other leg as well.  Exercises include:   Quad Sets - Tighten up the muscle on the front of the thigh (Quad) and hold for 5-10 seconds.   Straight Leg Raises - With your knee straight (if you were given a brace, keep it on), lift the leg to 60 degrees, hold for 3 seconds, and slowly lower the leg.  Perform this exercise against resistance later as your leg gets stronger.  Leg Slides: Lying on your back, slowly slide your foot toward your buttocks, bending your knee up off the floor (only go as far as is comfortable). Then slowly slide your foot back down until your leg is flat on the floor again.  Angel Wings: Lying on your back spread your legs to the side as far  apart as you can without causing discomfort.  Hamstring Strength:  Lying on your back, push your heel against the floor with your leg straight by tightening up the muscles of your buttocks.  Repeat, but this time bend your knee to a comfortable angle, and push your heel against the floor.  You may put a pillow under the heel to make it  more comfortable if necessary.   A rehabilitation program following joint replacement surgery can speed recovery and prevent re-injury in the future due to weakened muscles. Contact your doctor or a physical therapist for more information on knee rehabilitation.    CONSTIPATION  Constipation is defined medically as fewer than three stools per week and severe constipation as less than one stool per week.  Even if you have a regular bowel pattern at home, your normal regimen is likely to be disrupted due to multiple reasons following surgery.  Combination of anesthesia, postoperative narcotics, change in appetite and fluid intake all can affect your bowels.   YOU MUST use at least one of the following options; they are listed in order of increasing strength to get the job done.  They are all available over the counter, and you may need to use some, POSSIBLY even all of these options:    Drink plenty of fluids (prune juice may be helpful) and high fiber foods Colace 100 mg by mouth twice a day  Senokot for constipation as directed and as needed Dulcolax (bisacodyl), take with full glass of water  Miralax (polyethylene glycol) once or twice a day as needed.  If you have tried all these things and are unable to have a bowel movement in the first 3-4 days after surgery call either your surgeon or your primary doctor.    If you experience loose stools or diarrhea, hold the medications until you stool forms back up.  If your symptoms do not get better within 1 week or if they get worse, check with your doctor.  If you experience "the worst abdominal pain ever" or develop  nausea or vomiting, please contact the office immediately for further recommendations for treatment.   ITCHING:  If you experience itching with your medications, try taking only a single pain pill, or even half a pain pill at a time.  You can also use Benadryl over the counter for itching or also to help with sleep.   TED HOSE STOCKINGS:  Use stockings on both legs until for at least 2 weeks or as directed by physician office. They may be removed at night for sleeping.  MEDICATIONS:  See your medication summary on the "After Visit Summary" that nursing will review with you.  You may have some home medications which will be placed on hold until you complete the course of blood thinner medication.  It is important for you to complete the blood thinner medication as prescribed.  PRECAUTIONS:  If you experience chest pain or shortness of breath - call 911 immediately for transfer to the hospital emergency department.   If you develop a fever greater that 101 F, purulent drainage from wound, increased redness or drainage from wound, foul odor from the wound/dressing, or calf pain - CONTACT YOUR SURGEON.                                                   FOLLOW-UP APPOINTMENTS:  If you do not already have a post-op appointment, please call the office for an appointment to be seen by your surgeon.  Guidelines for how soon to be seen are listed in your "After Visit Summary", but are typically between 1-4 weeks after surgery.   MAKE SURE YOU:  Understand these instructions.  Get help right away if you are  not doing well or get worse.    Thank you for letting us be a part of your medical care team.  It is a privilege we respect greatly.  We hope these instructions will help you stay on track for a fast and full recovery!     Increase activity slowly as tolerated    Complete by:  As directed             Medication List    STOP taking these medications        aspirin 81 MG tablet  Replaced by:   aspirin EC 325 MG tablet      TAKE these medications        ALPRAZolam 0.25 MG tablet  Commonly known as:  XANAX  Take 1 tablet (0.25 mg total) by mouth 3 (three) times daily as needed (tremor).     amLODipine 5 MG tablet  Commonly known as:  NORVASC  take 1 tablet by mouth once daily     aspirin EC 325 MG tablet  Take 1 tablet (325 mg total) by mouth 2 (two) times daily. To prevent blood clots     atorvastatin 20 MG tablet  Commonly known as:  LIPITOR  Take 20 mg by mouth at bedtime.     donepezil 5 MG tablet  Commonly known as:  ARICEPT  Take 5 mg by mouth at bedtime.     hydrochlorothiazide 12.5 MG capsule  Commonly known as:  MICROZIDE  take 1 capsule by mouth once daily     HYDROcodone-acetaminophen 5-325 MG tablet  Commonly known as:  NORCO/VICODIN  Take 1-2 tablets by mouth every 4 (four) hours as needed (breakthrough pain).     methocarbamol 500 MG tablet  Commonly known as:  ROBAXIN  Take 1 tablet (500 mg total) by mouth every 6 (six) hours as needed for muscle spasms.     pramoxine 1 % foam  Commonly known as:  PROCTOFOAM  Place rectally every 2 (two) hours as needed for hemorrhoids.     silodosin 8 MG Caps capsule  Commonly known as:  RAPAFLO  Take 8 mg by mouth at bedtime.     tadalafil 5 MG tablet  Commonly known as:  CIALIS  Take 1 tablet (5 mg total) by mouth daily.           Follow-up Information    Follow up with GIOFFRE,RONALD A, MD. Schedule an appointment as soon as possible for a visit in 2 weeks.   Specialty:  Orthopedic Surgery   Contact information:   [address] [place] [address] [phone]       Follow up with [facility].   Why:  rolling walker and 3n1 (over the commode seat)   Contact information:   [address] [place] [address] [phone]       Signed: Ardeen Jourdain, PA-C Orthopaedic Surgery 05/12/2015, 8:30 AM

## 2015-05-14 DIAGNOSIS — M1711 Unilateral primary osteoarthritis, right knee: Secondary | ICD-10-CM | POA: Diagnosis not present

## 2015-05-17 ENCOUNTER — Emergency Department (HOSPITAL_COMMUNITY): Payer: Medicare Other

## 2015-05-17 ENCOUNTER — Inpatient Hospital Stay (HOSPITAL_COMMUNITY)
Admission: EM | Admit: 2015-05-17 | Discharge: 2015-05-21 | DRG: 482 | Disposition: A | Discharge status: Skilled Nursing Facility | Payer: Medicare Other | Attending: Internal Medicine | Admitting: Internal Medicine

## 2015-05-17 DIAGNOSIS — I739 Peripheral vascular disease, unspecified: Secondary | ICD-10-CM | POA: Diagnosis not present

## 2015-05-17 DIAGNOSIS — Y92009 Unspecified place in unspecified non-institutional (private) residence as the place of occurrence of the external cause: Secondary | ICD-10-CM | POA: Diagnosis not present

## 2015-05-17 DIAGNOSIS — Z4789 Encounter for other orthopedic aftercare: Secondary | ICD-10-CM | POA: Diagnosis not present

## 2015-05-17 DIAGNOSIS — M76899 Other specified enthesopathies of unspecified lower limb, excluding foot: Secondary | ICD-10-CM | POA: Diagnosis present

## 2015-05-17 DIAGNOSIS — Z955 Presence of coronary angioplasty implant and graft: Secondary | ICD-10-CM

## 2015-05-17 DIAGNOSIS — M25561 Pain in right knee: Secondary | ICD-10-CM | POA: Diagnosis not present

## 2015-05-17 DIAGNOSIS — S72301D Unspecified fracture of shaft of right femur, subsequent encounter for closed fracture with routine healing: Secondary | ICD-10-CM | POA: Diagnosis not present

## 2015-05-17 DIAGNOSIS — S72341A Displaced spiral fracture of shaft of right femur, initial encounter for closed fracture: Secondary | ICD-10-CM | POA: Diagnosis not present

## 2015-05-17 DIAGNOSIS — R52 Pain, unspecified: Secondary | ICD-10-CM

## 2015-05-17 DIAGNOSIS — I2510 Atherosclerotic heart disease of native coronary artery without angina pectoris: Secondary | ICD-10-CM | POA: Diagnosis present

## 2015-05-17 DIAGNOSIS — Z96652 Presence of left artificial knee joint: Secondary | ICD-10-CM | POA: Diagnosis not present

## 2015-05-17 DIAGNOSIS — S72451A Displaced supracondylar fracture without intracondylar extension of lower end of right femur, initial encounter for closed fracture: Secondary | ICD-10-CM | POA: Diagnosis present

## 2015-05-17 DIAGNOSIS — I10 Essential (primary) hypertension: Secondary | ICD-10-CM | POA: Diagnosis present

## 2015-05-17 DIAGNOSIS — S72351A Displaced comminuted fracture of shaft of right femur, initial encounter for closed fracture: Secondary | ICD-10-CM | POA: Diagnosis not present

## 2015-05-17 DIAGNOSIS — T148 Other injury of unspecified body region: Secondary | ICD-10-CM | POA: Diagnosis not present

## 2015-05-17 DIAGNOSIS — W07XXXA Fall from chair, initial encounter: Secondary | ICD-10-CM | POA: Diagnosis present

## 2015-05-17 DIAGNOSIS — Z951 Presence of aortocoronary bypass graft: Secondary | ICD-10-CM

## 2015-05-17 DIAGNOSIS — E785 Hyperlipidemia, unspecified: Secondary | ICD-10-CM | POA: Diagnosis present

## 2015-05-17 DIAGNOSIS — Z8551 Personal history of malignant neoplasm of bladder: Secondary | ICD-10-CM | POA: Diagnosis not present

## 2015-05-17 DIAGNOSIS — M779 Enthesopathy, unspecified: Secondary | ICD-10-CM | POA: Diagnosis present

## 2015-05-17 DIAGNOSIS — S72453A Displaced supracondylar fracture without intracondylar extension of lower end of unspecified femur, initial encounter for closed fracture: Secondary | ICD-10-CM | POA: Diagnosis present

## 2015-05-17 DIAGNOSIS — Z09 Encounter for follow-up examination after completed treatment for conditions other than malignant neoplasm: Secondary | ICD-10-CM

## 2015-05-17 DIAGNOSIS — S7291XA Unspecified fracture of right femur, initial encounter for closed fracture: Secondary | ICD-10-CM | POA: Diagnosis present

## 2015-05-17 DIAGNOSIS — M6281 Muscle weakness (generalized): Secondary | ICD-10-CM | POA: Diagnosis not present

## 2015-05-17 DIAGNOSIS — Z96651 Presence of right artificial knee joint: Secondary | ICD-10-CM | POA: Diagnosis present

## 2015-05-17 DIAGNOSIS — I441 Atrioventricular block, second degree: Secondary | ICD-10-CM | POA: Diagnosis present

## 2015-05-17 DIAGNOSIS — S72301A Unspecified fracture of shaft of right femur, initial encounter for closed fracture: Secondary | ICD-10-CM

## 2015-05-17 DIAGNOSIS — I25810 Atherosclerosis of coronary artery bypass graft(s) without angina pectoris: Secondary | ICD-10-CM | POA: Diagnosis not present

## 2015-05-17 DIAGNOSIS — S72391A Other fracture of shaft of right femur, initial encounter for closed fracture: Secondary | ICD-10-CM | POA: Diagnosis not present

## 2015-05-17 DIAGNOSIS — R278 Other lack of coordination: Secondary | ICD-10-CM | POA: Diagnosis not present

## 2015-05-17 DIAGNOSIS — Z01818 Encounter for other preprocedural examination: Secondary | ICD-10-CM | POA: Diagnosis not present

## 2015-05-17 DIAGNOSIS — M9711XA Periprosthetic fracture around internal prosthetic right knee joint, initial encounter: Secondary | ICD-10-CM | POA: Diagnosis not present

## 2015-05-17 DIAGNOSIS — Z471 Aftercare following joint replacement surgery: Secondary | ICD-10-CM | POA: Diagnosis not present

## 2015-05-17 DIAGNOSIS — R262 Difficulty in walking, not elsewhere classified: Secondary | ICD-10-CM | POA: Diagnosis not present

## 2015-05-17 DIAGNOSIS — Z419 Encounter for procedure for purposes other than remedying health state, unspecified: Secondary | ICD-10-CM

## 2015-05-17 DIAGNOSIS — Z9181 History of falling: Secondary | ICD-10-CM | POA: Diagnosis not present

## 2015-05-17 DIAGNOSIS — S728X9A Other fracture of unspecified femur, initial encounter for closed fracture: Secondary | ICD-10-CM | POA: Diagnosis not present

## 2015-05-17 LAB — CBC WITH DIFFERENTIAL/PLATELET
Basophils Absolute: 0 10*3/uL (ref 0.0–0.1)
Basophils Relative: 0 %
Eosinophils Absolute: 0.1 10*3/uL (ref 0.0–0.7)
Eosinophils Relative: 1 %
HEMATOCRIT: 32.3 % — AB (ref 39.0–52.0)
HEMOGLOBIN: 10.8 g/dL — AB (ref 13.0–17.0)
LYMPHS ABS: 1.1 10*3/uL (ref 0.7–4.0)
Lymphocytes Relative: 12 %
MCH: 33.4 pg (ref 26.0–34.0)
MCHC: 33.4 g/dL (ref 30.0–36.0)
MCV: 100 fL (ref 78.0–100.0)
MONOS PCT: 7 %
Monocytes Absolute: 0.6 10*3/uL (ref 0.1–1.0)
NEUTROS ABS: 7.6 10*3/uL (ref 1.7–7.7)
NEUTROS PCT: 80 %
PLATELETS: 265 10*3/uL (ref 150–400)
RBC: 3.23 MIL/uL — ABNORMAL LOW (ref 4.22–5.81)
RDW: 12.8 % (ref 11.5–15.5)
WBC: 9.4 10*3/uL (ref 4.0–10.5)

## 2015-05-17 LAB — BASIC METABOLIC PANEL
ANION GAP: 5 (ref 5–15)
BUN: 25 mg/dL — ABNORMAL HIGH (ref 6–20)
CALCIUM: 9 mg/dL (ref 8.9–10.3)
CO2: 27 mmol/L (ref 22–32)
CREATININE: 1.29 mg/dL — AB (ref 0.61–1.24)
Chloride: 106 mmol/L (ref 101–111)
GFR, EST AFRICAN AMERICAN: 55 mL/min — AB (ref >60)
GFR, EST NON AFRICAN AMERICAN: 48 mL/min — AB (ref >60)
Glucose, Bld: 124 mg/dL — ABNORMAL HIGH (ref 65–99)
Potassium: 5.1 mmol/L (ref 3.5–5.1)
Sodium: 138 mmol/L (ref 135–145)

## 2015-05-17 LAB — PROTIME-INR
INR: 1.09 (ref 0.00–1.49)
Prothrombin Time: 14.3 seconds (ref 11.6–15.2)

## 2015-05-17 MED ORDER — AMLODIPINE BESYLATE 2.5 MG PO TABS
2.5000 mg | ORAL_TABLET | Freq: Every day | ORAL | Status: DC
  Administered 2015-05-18 – 2015-05-21 (×3): 2.5 mg via ORAL
  Filled 2015-05-17 (×4): qty 1

## 2015-05-17 MED ORDER — DOCUSATE SODIUM 100 MG PO CAPS
100.0000 mg | ORAL_CAPSULE | Freq: Two times a day (BID) | ORAL | Status: DC
  Administered 2015-05-17 – 2015-05-21 (×7): 100 mg via ORAL

## 2015-05-17 MED ORDER — HYDROCODONE-ACETAMINOPHEN 5-325 MG PO TABS
1.0000 | ORAL_TABLET | Freq: Four times a day (QID) | ORAL | Status: DC | PRN
  Administered 2015-05-17 – 2015-05-18 (×2): 2 via ORAL
  Administered 2015-05-19: 1 via ORAL
  Filled 2015-05-17: qty 2
  Filled 2015-05-17: qty 1
  Filled 2015-05-17: qty 2

## 2015-05-17 MED ORDER — MORPHINE SULFATE (PF) 2 MG/ML IV SOLN
2.0000 mg | Freq: Once | INTRAVENOUS | Status: AC
  Administered 2015-05-17: 2 mg via INTRAVENOUS
  Filled 2015-05-17: qty 1

## 2015-05-17 MED ORDER — METHOCARBAMOL 1000 MG/10ML IJ SOLN
500.0000 mg | Freq: Four times a day (QID) | INTRAVENOUS | Status: DC | PRN
  Administered 2015-05-19: 500 mg via INTRAVENOUS
  Filled 2015-05-17 (×3): qty 5

## 2015-05-17 MED ORDER — ACETAMINOPHEN 500 MG PO TABS
1000.0000 mg | ORAL_TABLET | Freq: Once | ORAL | Status: AC
  Administered 2015-05-17: 1000 mg via ORAL
  Filled 2015-05-17: qty 2

## 2015-05-17 MED ORDER — MORPHINE SULFATE (PF) 2 MG/ML IV SOLN
0.5000 mg | INTRAVENOUS | Status: DC | PRN
  Administered 2015-05-18: 0.5 mg via INTRAVENOUS
  Filled 2015-05-17: qty 1

## 2015-05-17 MED ORDER — POLYETHYLENE GLYCOL 3350 17 G PO PACK
17.0000 g | PACK | Freq: Every day | ORAL | Status: DC | PRN
  Administered 2015-05-19 – 2015-05-20 (×2): 17 g via ORAL
  Filled 2015-05-17 (×2): qty 1

## 2015-05-17 MED ORDER — ATORVASTATIN CALCIUM 20 MG PO TABS
20.0000 mg | ORAL_TABLET | Freq: Every day | ORAL | Status: DC
  Administered 2015-05-17 – 2015-05-20 (×4): 20 mg via ORAL
  Filled 2015-05-17 (×5): qty 1

## 2015-05-17 MED ORDER — SODIUM CHLORIDE 0.9 % IV SOLN
INTRAVENOUS | Status: DC
  Administered 2015-05-17 – 2015-05-18 (×2): via INTRAVENOUS

## 2015-05-17 MED ORDER — DONEPEZIL HCL 5 MG PO TABS
5.0000 mg | ORAL_TABLET | Freq: Every day | ORAL | Status: DC
  Administered 2015-05-17 – 2015-05-20 (×4): 5 mg via ORAL
  Filled 2015-05-17 (×5): qty 1

## 2015-05-17 MED ORDER — KCL IN DEXTROSE-NACL 20-5-0.45 MEQ/L-%-% IV SOLN: INTRAVENOUS | Status: DC

## 2015-05-17 MED ORDER — FENTANYL CITRATE (PF) 100 MCG/2ML IJ SOLN
100.0000 ug | Freq: Once | INTRAMUSCULAR | Status: AC
  Administered 2015-05-17: 100 ug via INTRAVENOUS
  Filled 2015-05-17: qty 2

## 2015-05-17 MED ORDER — TAMSULOSIN HCL 0.4 MG PO CAPS
0.4000 mg | ORAL_CAPSULE | Freq: Every day | ORAL | Status: DC
  Administered 2015-05-17 – 2015-05-20 (×4): 0.4 mg via ORAL
  Filled 2015-05-17 (×5): qty 1

## 2015-05-17 MED ORDER — METHOCARBAMOL 500 MG PO TABS
500.0000 mg | ORAL_TABLET | Freq: Four times a day (QID) | ORAL | Status: DC | PRN
  Administered 2015-05-17 – 2015-05-18 (×3): 500 mg via ORAL
  Filled 2015-05-17 (×3): qty 1

## 2015-05-17 NOTE — ED Provider Notes (Signed)
CSN: [id number]     Arrival date & time 05/17/15  1335 History   First MD Initiated Contact with Patient 05/17/15 1524     Chief Complaint  Patient presents with  . Fall     (Consider location/radiation/quality/duration/timing/severity/associated sxs/prior Treatment) Patient is a 82 y.o. male presenting with fall. The history is provided by the patient.  Fall This is a new problem. The current episode started 1 to 2 hours ago. The problem occurs constantly. The problem has not changed since onset.Pertinent negatives include no chest pain, no abdominal pain, no headaches and no shortness of breath. The symptoms are aggravated by walking, bending and twisting. He has tried nothing for the symptoms. The treatment provided no relief.   82 yo M With a chief complaint of right knee pain. Patient was trying to sit down on a stool and missed and landed on his right side. Felt like his right knee buckled underneath him. Denies head injury denies loss of consciousness. He is unable to move his right lower extremity at the knee due to significant tenderness. Denies any low back pain.  Past Medical History  Diagnosis Date  . CAD (coronary artery disease)   . Essential and other specified forms of tremor 07/15/2012  . RBBB   . Mobitz type 2 second degree atrioventricular block   . HTN (hypertension) 01/30/2013  . Urinary frequency   . Cancer ([place])     hx of bladder cancer- 2002   . Dementia due to general medical condition without behavioral disturbance     unspecified (per wife) dx at [facility]   Past Surgical History  Procedure Laterality Date  . Knee surgery Right 2006  . Coronary artery bypass graft  05/10/2007    LIMA to LAD, SVG to 1st and 2nd OM and SVG to RCA  . Cardiac catheterization  04/27/2007    3 vessel disease  . Coronary angioplasty  1988  . Coronary angioplasty with stent placement  1998    LAD  . US echocardiography  10/01/2010    mild concentric LVH,LA mildly dilated,RA mild  to mod. dilated,mild MR,mild to mod. TR,mild AI,mild PI,sinus rhythm w/pauses  . Nm myocar perf wall motion  04/20/2007    mod. reversible ischemia  . Total knee arthroplasty Left 05/08/2015    Procedure: LEFT TOTAL KNEE ARTHROPLASTY;  Surgeon: Latanya Maudlin, MD;  Location: [facility];  Service: Orthopedics;  Laterality: Left;   Family History  Problem Relation Age of Onset  . Pneumonia Mother   . Cancer Father   . Cancer Brother    Social History  Substance Use Topics  . Smoking status: Never Smoker   . Smokeless tobacco: Never Used  . Alcohol Use: No    Review of Systems  Constitutional: Negative for fever and chills.  HENT: Negative for congestion and facial swelling.   Eyes: Negative for discharge and visual disturbance.  Respiratory: Negative for shortness of breath.   Cardiovascular: Negative for chest pain and palpitations.  Gastrointestinal: Negative for vomiting, abdominal pain and diarrhea.  Musculoskeletal: Positive for myalgias and arthralgias.  Skin: Negative for color change and rash.  Neurological: Negative for tremors, syncope and headaches.  Psychiatric/Behavioral: Negative for confusion and dysphoric mood.      Allergies  Review of patient's allergies indicates no known allergies.  Home Medications   Prior to Admission medications   Medication Sig Start Date End Date Taking? Authorizing Provider  amLODipine (NORVASC) 2.5 MG tablet Take 2.5 mg by mouth daily. 05/02/15  Yes Historical Provider, MD  aspirin EC 325 MG tablet Take 1 tablet (325 mg total) by mouth 2 (two) times daily. To prevent blood clots 05/09/15  Yes Latanya Maudlin, MD  atorvastatin (LIPITOR) 20 MG tablet Take 20 mg by mouth at bedtime.  10/25/14  Yes Historical Provider, MD  donepezil (ARICEPT) 5 MG tablet Take 5 mg by mouth at bedtime.   Yes Historical Provider, MD  HYDROcodone-acetaminophen (NORCO/VICODIN) 5-325 MG tablet Take 1-2 tablets by mouth every 4 (four) hours as needed (breakthrough  pain). 05/09/15  Yes Latanya Maudlin, MD  silodosin (RAPAFLO) 8 MG CAPS capsule Take 8 mg by mouth at bedtime.   Yes Historical Provider, MD  hydrochlorothiazide (MICROZIDE) 12.5 MG capsule take 1 capsule by mouth once daily Patient not taking: Reported on 12/14/2014 11/06/13   Dani Gobble Croitoru, MD  methocarbamol (ROBAXIN) 500 MG tablet Take 1 tablet (500 mg total) by mouth every 6 (six) hours as needed for muscle spasms. 05/09/15   Latanya Maudlin, MD   BP 138/62 mmHg  Pulse 66  Temp(Src) 98.5 F (36.9 C) (Oral)  Resp 12  SpO2 96% Physical Exam  Constitutional: He is oriented to person, place, and time. He appears well-developed and well-nourished.  HENT:  Head: Normocephalic and atraumatic.  Eyes: EOM are normal. Pupils are equal, round, and reactive to light.  Neck: Normal range of motion. Neck supple. No JVD present.  Cardiovascular: Normal rate and regular rhythm.  Exam reveals no gallop and no friction rub.   No murmur heard. Pulmonary/Chest: No respiratory distress. He has no wheezes.  Abdominal: He exhibits no distension. There is no tenderness. There is no rebound and no guarding.  Musculoskeletal: Normal range of motion. He exhibits edema and tenderness.  Significant tenderness and edema to the right knee. Pulse motor and sensation is intact distally. When trying to extend the knee feeling a lot of popping and clicking about the knee joint feeling like it's unstable.  Neurological: He is alert and oriented to person, place, and time.  Skin: No rash noted. No pallor.  Psychiatric: He has a normal mood and affect. His behavior is normal.  Nursing note and vitals reviewed.   ED Course  Procedures (including critical care time) Labs Review Labs Reviewed  CBC WITH DIFFERENTIAL/PLATELET - Abnormal; Notable for the following:    RBC 3.23 (*)    Hemoglobin 10.8 (*)    HCT 32.3 (*)    All other components within normal limits  BASIC METABOLIC PANEL - Abnormal; Notable for the  following:    Glucose, Bld 124 (*)    BUN 25 (*)    Creatinine, Ser 1.29 (*)    GFR calc non Af Amer 48 (*)    GFR calc Af Amer 55 (*)    All other components within normal limits  PROTIME-INR  CBC  CREATININE, SERUM  CBC  BASIC METABOLIC PANEL  TYPE AND SCREEN  ABO/RH    Imaging Review Dg Chest Port 1 View  05/17/2015  CLINICAL DATA:  Preoperative for ORIF right femur fracture EXAM: PORTABLE CHEST 1 VIEW COMPARISON:  05/01/2015 chest radiograph. FINDINGS: Sternotomy wires appear aligned and intact. CABG clips overlie the mediastinum. Stable cardiomediastinal silhouette with top-normal heart size. No pneumothorax. No pleural effusion. Lungs appear clear, with no acute consolidative airspace disease and no pulmonary edema. IMPRESSION: No active disease. Electronically Signed   By: Ilona Sorrel M.D.   On: 05/17/2015 16:34   Dg Femur, Min 2 Views Right  05/17/2015  CLINICAL  DATA:  82 year old male who fell at home. Pain. Initial encounter. EXAM: RIGHT FEMUR 2 VIEWS COMPARISON:  None. FINDINGS: Right femoral head normally located. Proximal right femur appears intact. Visible right hemipelvis grossly intact. Previous right total knee arthroplasty. Comminuted spiral fracture extending from about 14 cm proximal to the knee to the anterior superior margin of the femoral hardware (arrow image 4). Anterior displacement 1 full shaft width. Moderate anterior angulation. Over riding of fracture fragments up to 4 cm. Mild medial displacement and lateral angulation. Anterior displacement of a large butterfly fragment. Incidental external zipper artifact along the lateral knee. The knee arthroplasty components remain normally aligned. Dystrophic calcification about the patella. There does appear to be hemorrhage within the joint. Grossly intact proximal tibia and fibula. IMPRESSION: 1. Long segment comminuted spiral fracture involving the distal third right femoral shaft and extending to the proximal margin of  the right knee femoral implant. Large butterfly fragment. 2. One full shaft wdith anterior displacement with 4 cm of overriding, and anterior as well as medial angulation. 3. Suggestion of right knee hemarthrosis, but the knee hardware appears normally aligned. 4. Proximal right femur intact. Electronically Signed   By: Genevie Ann M.D.   On: 05/17/2015 16:11   I have personally reviewed and evaluated these images and lab results as part of my medical decision-making.   EKG Interpretation   Date/Time:  Friday May 17 2015 16:11:26 EDT Ventricular Rate:  65 PR Interval:  153 QRS Duration: 148 QT Interval:  448 QTC Calculation: 466 R Axis:   14 Text Interpretation:  Sinus rhythm Probable left atrial enlargement Right  bundle branch block No significant change since last tracing Confirmed by  Shaketa Serafin MD, Quillian Quince [id number]) on 05/17/2015 4:23:54 PM      MDM   Final diagnoses:  Closed fracture of shaft of right femur, unspecified fracture morphology, initial encounter [facility])    82 yo M with a chief complaint of right knee pain. Concerning for possible dislocation as the patient is unable to move and has significant tenderness with motion. Had that repaired back in 2000.   Patient has a distal femur fracture seen on plain film. Will discuss with [facility].  The patient needs to go to the operating room. Hospitalist admit.  Reduced by Dr. Tonita Cong, ortho at bedside with fentanyl.   The patients results and plan were reviewed and discussed.   Any x-rays performed were independently reviewed by myself.   Differential diagnosis were considered with the presenting HPI.  Medications  amLODipine (NORVASC) tablet 2.5 mg (not administered)  donepezil (ARICEPT) tablet 5 mg (not administered)  tamsulosin (FLOMAX) capsule 0.4 mg (not administered)  atorvastatin (LIPITOR) tablet 20 mg (not administered)  HYDROcodone-acetaminophen (NORCO/VICODIN) 5-325 MG per tablet 1-2 tablet (not  administered)  morphine 2 MG/ML injection 0.5 mg (not administered)  methocarbamol (ROBAXIN) tablet 500 mg (not administered)    Or  methocarbamol (ROBAXIN) 500 mg in dextrose 5 % 50 mL IVPB (not administered)  docusate sodium (COLACE) capsule 100 mg (not administered)  polyethylene glycol (MIRALAX / GLYCOLAX) packet 17 g (not administered)  0.9 %  sodium chloride infusion (not administered)  morphine 2 MG/ML injection 2 mg (2 mg Intravenous Given 05/17/15 1616)  acetaminophen (TYLENOL) tablet 1,000 mg (1,000 mg Oral Given 05/17/15 1613)  fentaNYL (SUBLIMAZE) injection 100 mcg (100 mcg Intravenous Given 05/17/15 1743)    Filed Vitals:   05/17/15 1343 05/17/15 1348 05/17/15 1604  BP:  131/88 138/62  Pulse:  67 66  Temp:  98.5 F (36.9 C)   TempSrc:  Oral   Resp:  18 12  SpO2: 97% 98% 96%    Final diagnoses:  Pain    Admission/ observation were discussed with the admitting physician, patient and/or family and they are comfortable with the plan.    Deno Etienne, DO 05/17/15 1930

## 2015-05-17 NOTE — ED Notes (Signed)
MD at bedside. 

## 2015-05-17 NOTE — ED Notes (Signed)
Patient in Xray at this time.

## 2015-05-17 NOTE — Progress Notes (Signed)
Patient noted to be admitted and discharged from the hospital from 03/22 to 03/24 for LEFT TOTAL KNEE ARTHROPLASTY.  Patient presents to ED post fall sustaining injury to right femur area requiring ORIF R distal femur.  Patient lives at home with his wife.  Patient rpeorts he has a 3 in 1 commode and a rolling walker at home.  Patient reports using his walker when ambulating.  Patient's wife at bedside [phone].  Patient's wife reports patient has been doing very well at home.  She reports patient is scheduled to be seen three times a week for PT at [facility], did not go home with home health services.  Patient's wife reports the patient may need a wheelchair upon discharge.  Patient's wife also interested in SNF for patient.  Offered support to patient and his wife.  Orthopedic surgeon now at bedside.

## 2015-05-17 NOTE — Consult Note (Addendum)
Reason for Consult: Right leg pain Referring Physician: EDP   [name] is an 82 y.o. male.  HPI: [place] today getting out of chair   Past Medical History  Diagnosis Date  . CAD (coronary artery disease)   . Essential and other specified forms of tremor 07/15/2012  . RBBB   . Mobitz type 2 second degree atrioventricular block   . HTN (hypertension) 01/30/2013  . Urinary frequency   . Cancer ([place])     hx of bladder cancer- 2002   . Dementia due to general medical condition without behavioral disturbance     unspecified (per wife) dx at [facility]    Past Surgical History  Procedure Laterality Date  . Knee surgery Right 2006  . Coronary artery bypass graft  05/10/2007    LIMA to LAD, SVG to 1st and 2nd OM and SVG to RCA  . Cardiac catheterization  04/27/2007    3 vessel disease  . Coronary angioplasty  1988  . Coronary angioplasty with stent placement  1998    LAD  . US echocardiography  10/01/2010    mild concentric LVH,LA mildly dilated,RA mild to mod. dilated,mild MR,mild to mod. TR,mild AI,mild PI,sinus rhythm w/pauses  . Nm myocar perf wall motion  04/20/2007    mod. reversible ischemia  . Total knee arthroplasty Left 05/08/2015    Procedure: LEFT TOTAL KNEE ARTHROPLASTY;  Surgeon: Latanya Maudlin, MD;  Location: [facility];  Service: Orthopedics;  Laterality: Left;    Family History  Problem Relation Age of Onset  . Pneumonia Mother   . Cancer Father   . Cancer Brother     Social History:  reports that he has never smoked. He has never used smokeless tobacco. He reports that he does not drink alcohol or use illicit drugs.  Allergies: No Known Allergies  Medications: Reviewed  Results for orders placed or performed during the hospital encounter of 05/17/15 (from the past 48 hour(s))  CBC with Differential     Status: Abnormal   Collection Time: 05/17/15  4:29 PM  Result Value Ref Range   WBC 9.4 4.0 - 10.5 K/uL   RBC 3.23 (L) 4.22 - 5.81 MIL/uL   Hemoglobin 10.8 (L)  13.0 - 17.0 g/dL   HCT 32.3 (L) 39.0 - 52.0 %   MCV 100.0 78.0 - 100.0 fL   MCH 33.4 26.0 - 34.0 pg   MCHC 33.4 30.0 - 36.0 g/dL   RDW 12.8 11.5 - 15.5 %   Platelets 265 150 - 400 K/uL   Neutrophils Relative % 80 %   Neutro Abs 7.6 1.7 - 7.7 K/uL   Lymphocytes Relative 12 %   Lymphs Abs 1.1 0.7 - 4.0 K/uL   Monocytes Relative 7 %   Monocytes Absolute 0.6 0.1 - 1.0 K/uL   Eosinophils Relative 1 %   Eosinophils Absolute 0.1 0.0 - 0.7 K/uL   Basophils Relative 0 %   Basophils Absolute 0.0 0.0 - 0.1 K/uL  Basic metabolic panel     Status: Abnormal   Collection Time: 05/17/15  4:29 PM  Result Value Ref Range   Sodium 138 135 - 145 mmol/L   Potassium 5.1 3.5 - 5.1 mmol/L   Chloride 106 101 - 111 mmol/L   CO2 27 22 - 32 mmol/L   Glucose, Bld 124 (H) 65 - 99 mg/dL   BUN 25 (H) 6 - 20 mg/dL   Creatinine, Ser 1.29 (H) 0.61 - 1.24 mg/dL   Calcium 9.0 8.9 -  10.3 mg/dL   GFR calc non Af Amer 48 (L) >60 mL/min   GFR calc Af Amer 55 (L) >60 mL/min    Comment: (NOTE) The eGFR has been calculated using the CKD EPI equation. This calculation has not been validated in all clinical situations. eGFR's persistently <60 mL/min signify possible Chronic Kidney Disease.    Anion gap 5 5 - 15  Type and screen     Status: None   Collection Time: 05/17/15  4:29 PM  Result Value Ref Range   ABO/RH(D) O POS    Antibody Screen NEG    Sample Expiration 05/20/2015   Protime-INR     Status: None   Collection Time: 05/17/15  4:29 PM  Result Value Ref Range   Prothrombin Time 14.3 11.6 - 15.2 seconds   INR 1.09 0.00 - 1.49    Dg Chest Port 1 View  05/17/2015  CLINICAL DATA:  Preoperative for ORIF right femur fracture EXAM: PORTABLE CHEST 1 VIEW COMPARISON:  05/01/2015 chest radiograph. FINDINGS: Sternotomy wires appear aligned and intact. CABG clips overlie the mediastinum. Stable cardiomediastinal silhouette with top-normal heart size. No pneumothorax. No pleural effusion. Lungs appear clear, with no  acute consolidative airspace disease and no pulmonary edema. IMPRESSION: No active disease. Electronically Signed   By: Ilona Sorrel M.D.   On: 05/17/2015 16:34   Dg Femur, Min 2 Views Right  05/17/2015  CLINICAL DATA:  82 year old male who fell at home. Pain. Initial encounter. EXAM: RIGHT FEMUR 2 VIEWS COMPARISON:  None. FINDINGS: Right femoral head normally located. Proximal right femur appears intact. Visible right hemipelvis grossly intact. Previous right total knee arthroplasty. Comminuted spiral fracture extending from about 14 cm proximal to the knee to the anterior superior margin of the femoral hardware (arrow image 4). Anterior displacement 1 full shaft width. Moderate anterior angulation. Over riding of fracture fragments up to 4 cm. Mild medial displacement and lateral angulation. Anterior displacement of a large butterfly fragment. Incidental external zipper artifact along the lateral knee. The knee arthroplasty components remain normally aligned. Dystrophic calcification about the patella. There does appear to be hemorrhage within the joint. Grossly intact proximal tibia and fibula. IMPRESSION: 1. Long segment comminuted spiral fracture involving the distal third right femoral shaft and extending to the proximal margin of the right knee femoral implant. Large butterfly fragment. 2. One full shaft wdith anterior displacement with 4 cm of overriding, and anterior as well as medial angulation. 3. Suggestion of right knee hemarthrosis, but the knee hardware appears normally aligned. 4. Proximal right femur intact. Electronically Signed   By: Genevie Ann M.D.   On: 05/17/2015 16:11    Review of Systems  Musculoskeletal: Positive for joint pain.  All other systems reviewed and are negative.  Blood pressure 138/62, pulse 66, temperature 98.5 F (36.9 C), temperature source Oral, resp. rate 12, SpO2 96 %. Physical Exam  Constitutional: He is oriented to person, place, and time. He appears  well-developed.  HENT:  Head: Normocephalic.  Eyes: Pupils are equal, round, and reactive to light.  Neck: Normal range of motion.  Cardiovascular: Normal rate.   Respiratory: Effort normal.  GI: Soft.  Musculoskeletal:  Right LE deformity. NVI. Compartments soft.  Neurological: He is alert and oriented to person, place, and time. He has normal reflexes.  Skin: Skin is warm.  Psychiatric: He has a normal mood and affect.    Assessment/Plan:  Right femur periprosthetic fracture. Plan closed reduction splinting then IM nailing. Discussed with Dr. Delfino Lovett  who will operate on Sunday. Procedure: Performed closed reduction with sedation and placed in [employer]. Dictation# [id number]   Sharmayne Jablon C 05/17/2015, 7:43 PM  Cell Phone# [phone]

## 2015-05-17 NOTE — ED Notes (Signed)
Bed: [id number] Expected date:  Expected time:  Means of arrival:  Comments: EMS- fall- 82 yo- recent knee replacement

## 2015-05-17 NOTE — ED Notes (Addendum)
Patient transported by [facility] from home.  Patient fell today while trying to sit and slid off cushion onto floor, hitting right knee and back.  Patient unable to straighten right knee.  Also c/o mild right hip pain.  Patient was dc'ed one week ago after left knee replacement on 05/08/15.  Patient given 141mcg Fentanyl en route.

## 2015-05-17 NOTE — H&P (Signed)
Patient Demographics  [name], is a 82 y.o. male  MRN: [id number]   DOB - 1924/07/04  Admit Date - 05/17/2015  Outpatient Primary MD for the patient is Leola Brazil, MD   With History of -  Past Medical History  Diagnosis Date  . CAD (coronary artery disease)   . Essential and other specified forms of tremor 07/15/2012  . RBBB   . Mobitz type 2 second degree atrioventricular block   . HTN (hypertension) 01/30/2013  . Urinary frequency   . Cancer ([place])     hx of bladder cancer- 2002   . Dementia due to general medical condition without behavioral disturbance     unspecified (per wife) dx at [facility]      Past Surgical History  Procedure Laterality Date  . Knee surgery Right 2006  . Coronary artery bypass graft  05/10/2007    LIMA to LAD, SVG to 1st and 2nd OM and SVG to RCA  . Cardiac catheterization  04/27/2007    3 vessel disease  . Coronary angioplasty  1988  . Coronary angioplasty with stent placement  1998    LAD  . US echocardiography  10/01/2010    mild concentric LVH,LA mildly dilated,RA mild to mod. dilated,mild MR,mild to mod. TR,mild AI,mild PI,sinus rhythm w/pauses  . Nm myocar perf wall motion  04/20/2007    mod. reversible ischemia  . Total knee arthroplasty Left 05/08/2015    Procedure: LEFT TOTAL KNEE ARTHROPLASTY;  Surgeon: Latanya Maudlin, MD;  Location: [facility];  Service: Orthopedics;  Laterality: Left;    in for   Chief Complaint  Patient presents with  . Fall     HPI  [name]  is a 82 y.o. male, past medical history of coronary artery disease status post CABG, hypertension, second-degree heart block Mobitz 1, and hyperlipidemia presents with full, patient status post total knee replacement last week, presents with mechanical fall today, denies syncope, lightheadedness, dizziness, chest pain palpitation or shortness of breath, trying to sit down on a stool, he missed the stool and that did on his right side, he felt severe  right knee  pain,  workup significant for right distal femoral shaft periprosthetic fracture, patient seen by Dr. Tonita Cong from orthopedic surgery, recommendation to admit as he will need ORIF of right distal femur, blood work with no significant abnormalities.    Review of Systems    In addition to the HPI above, No Fever-chills, No Headache, No changes with Vision or hearing, No problems swallowing food or Liquids, No Chest pain, Cough or Shortness of Breath, No Abdominal pain, No Nausea or Vommitting, Bowel movements are regular, No Blood in stool or Urine, No dysuria, No new skin rashes or bruises, Complains  of right knee pain No new weakness, tingling, numbness in any extremity, No recent weight gain or loss, No polyuria, polydypsia or polyphagia, No significant Mental Stressors.  A full 10 point Review of Systems was done, except as stated above, all other Review of Systems were negative.   Social History Social History  Substance Use Topics  . Smoking status: Never Smoker   . Smokeless tobacco: Never Used  . Alcohol Use: No     Family History Family History  Problem Relation Age of Onset  . Pneumonia Mother   . Cancer Father   . Cancer Brother      Prior to Admission medications   Medication Sig Start Date End Date Taking? Authorizing Provider  amLODipine (  NORVASC) 2.5 MG tablet Take 2.5 mg by mouth daily. 05/02/15  Yes Historical Provider, MD  aspirin EC 325 MG tablet Take 1 tablet (325 mg total) by mouth 2 (two) times daily. To prevent blood clots 05/09/15  Yes Latanya Maudlin, MD  atorvastatin (LIPITOR) 20 MG tablet Take 20 mg by mouth at bedtime.  10/25/14  Yes Historical Provider, MD  donepezil (ARICEPT) 5 MG tablet Take 5 mg by mouth at bedtime.   Yes Historical Provider, MD  HYDROcodone-acetaminophen (NORCO/VICODIN) 5-325 MG tablet Take 1-2 tablets by mouth every 4 (four) hours as needed (breakthrough pain). 05/09/15  Yes Latanya Maudlin, MD  silodosin (RAPAFLO)  8 MG CAPS capsule Take 8 mg by mouth at bedtime.   Yes Historical Provider, MD  hydrochlorothiazide (MICROZIDE) 12.5 MG capsule take 1 capsule by mouth once daily Patient not taking: Reported on 12/14/2014 11/06/13   Dani Gobble Croitoru, MD  methocarbamol (ROBAXIN) 500 MG tablet Take 1 tablet (500 mg total) by mouth every 6 (six) hours as needed for muscle spasms. 05/09/15   Latanya Maudlin, MD    No Known Allergies  Physical Exam  Vitals  Blood pressure 138/62, pulse 66, temperature 98.5 F (36.9 C), temperature source Oral, resp. rate 12, SpO2 96 %.   1. General well-developed male lying in bed in NAD,   2. Normal affect and insight, Not Suicidal or Homicidal, Awake Alert, Oriented X 3.  3. No F.N deficits, ALL C.Nerves Intact, Strength 5/5 all 4 extremities, Sensation intact all 4 extremities, Plantars down going.  4. Ears and Eyes appear Normal, Conjunctivae clear, PERRLA. Moist Oral Mucosa.  5. Supple Neck, No JVD, No cervical lymphadenopathy appriciated, No Carotid Bruits.  6. Symmetrical Chest wall movement, Good air movement bilaterally, CTAB.  7. RRR, No Gallops, Rubs or Murmurs, No Parasternal Heave.  8. Positive Bowel Sounds, Abdomen Soft, No tenderness, No organomegaly appriciated,No rebound -guarding or rigidity.  9.  No Cyanosis, Normal Skin Turgor, No Skin Rash or Bruise.  10. Good muscle tone, left knee with anterior surgical scar bandaged, right knee swollen, right lower extremity range of motion limited secondary to pain.  11. No Palpable Lymph Nodes in Neck or Axillae    Data Review  CBC  Recent Labs Lab 05/17/15 1629  WBC 9.4  HGB 10.8*  HCT 32.3*  PLT 265  MCV 100.0  MCH 33.4  MCHC 33.4  RDW 12.8  LYMPHSABS 1.1  MONOABS 0.6  EOSABS 0.1  BASOSABS 0.0   ------------------------------------------------------------------------------------------------------------------  Chemistries   Recent Labs Lab 05/17/15 1629  NA 138  K 5.1  CL 106    CO2 27  GLUCOSE 124*  BUN 25*  CREATININE 1.29*  CALCIUM 9.0   ------------------------------------------------------------------------------------------------------------------ estimated creatinine clearance is 37.7 mL/min (by C-G formula based on Cr of 1.29). ------------------------------------------------------------------------------------------------------------------ No results for input(s): TSH, T4TOTAL, T3FREE, THYROIDAB in the last 72 hours.  Invalid input(s): FREET3   Coagulation profile  Recent Labs Lab 05/17/15 1629  INR 1.09   ------------------------------------------------------------------------------------------------------------------- No results for input(s): DDIMER in the last 72 hours. -------------------------------------------------------------------------------------------------------------------  Cardiac Enzymes No results for input(s): CKMB, TROPONINI, MYOGLOBIN in the last 168 hours.  Invalid input(s): CK ------------------------------------------------------------------------------------------------------------------ Invalid input(s): POCBNP   ---------------------------------------------------------------------------------------------------------------  Urinalysis    Component Value Date/Time   COLORURINE YELLOW 05/01/2015 1325   APPEARANCEUR CLEAR 05/01/2015 1325   LABSPEC 1.020 05/01/2015 1325   PHURINE 5.5 05/01/2015 1325   GLUCOSEU NEGATIVE 05/01/2015 1325   HGBUR NEGATIVE 05/01/2015 1325   BILIRUBINUR NEGATIVE 05/01/2015 1325  KETONESUR NEGATIVE 05/01/2015 1325   PROTEINUR NEGATIVE 05/01/2015 1325   UROBILINOGEN 1.0 05/06/2007 1515   NITRITE NEGATIVE 05/01/2015 1325   LEUKOCYTESUR NEGATIVE 05/01/2015 1325    ----------------------------------------------------------------------------------------------------------------  Imaging results:   Dg Chest Port 1 View  05/17/2015  CLINICAL DATA:  Preoperative for ORIF right femur  fracture EXAM: PORTABLE CHEST 1 VIEW COMPARISON:  05/01/2015 chest radiograph. FINDINGS: Sternotomy wires appear aligned and intact. CABG clips overlie the mediastinum. Stable cardiomediastinal silhouette with top-normal heart size. No pneumothorax. No pleural effusion. Lungs appear clear, with no acute consolidative airspace disease and no pulmonary edema. IMPRESSION: No active disease. Electronically Signed   By: Ilona Sorrel M.D.   On: 05/17/2015 16:34   Dg Femur, Min 2 Views Right  05/17/2015  CLINICAL DATA:  82 year old male who fell at home. Pain. Initial encounter. EXAM: RIGHT FEMUR 2 VIEWS COMPARISON:  None. FINDINGS: Right femoral head normally located. Proximal right femur appears intact. Visible right hemipelvis grossly intact. Previous right total knee arthroplasty. Comminuted spiral fracture extending from about 14 cm proximal to the knee to the anterior superior margin of the femoral hardware (arrow image 4). Anterior displacement 1 full shaft width. Moderate anterior angulation. Over riding of fracture fragments up to 4 cm. Mild medial displacement and lateral angulation. Anterior displacement of a large butterfly fragment. Incidental external zipper artifact along the lateral knee. The knee arthroplasty components remain normally aligned. Dystrophic calcification about the patella. There does appear to be hemorrhage within the joint. Grossly intact proximal tibia and fibula. IMPRESSION: 1. Long segment comminuted spiral fracture involving the distal third right femoral shaft and extending to the proximal margin of the right knee femoral implant. Large butterfly fragment. 2. One full shaft wdith anterior displacement with 4 cm of overriding, and anterior as well as medial angulation. 3. Suggestion of right knee hemarthrosis, but the knee hardware appears normally aligned. 4. Proximal right femur intact. Electronically Signed   By: Genevie Ann M.D.   On: 05/17/2015 16:11    My personal review of EKG:  Rhythm NSR, old right bundle branch block, Rate  65 /min, QTc 466, no Acute ST changes    Assessment & Plan  Principal Problem:   Fracture of femoral shaft, right, closed ([place]) Active Problems:   CAD (coronary artery disease)s/p CABG (LIMA to LAD, SVG sequential to first and second OM, SVG to distal RCA) 2009   Second degree AV block, Mobitz type I   Dyslipidemia   HTN (hypertension)   Femur fracture, right ([facility])   Hip abductor tendinitis    Right distal femoral shaft fracture - Orthopedic consult greatly appreciated, patient seen by Dr. Tonita Cong, will need surgical repair, most likely right distal femur ORIF, plan is for surgical repair tomorrow or day after. - Patient is moderate risk for surgical intervention given his age, and history of coronary artery disease, EKG with no acute changes, denies any chest pain, shortness of breath or palpitation, at this point no further workup indicated prior to surgery. -Continue with when necessary pain and nausea medication. - Hip fracture order set utilized on admission - Unclear surgery tomorrow or day after, if it is day after we'll give one dose of Lovenox tomorrow, and if surgery is tomorrow will start DVT prophylaxis postoperatively.  History of carotid artery disease - Denies any chest pain or shortness of breath, will resume aspirin after surgery(or can be started on aspirin 325 mg twice a day postoperatively for DVT prophylaxis)  Hypertension - Continue amlodipine, continue  to hold hydrochlorothiazide  Dyslipidemia - Continue statin  DVT ProphylaxisSCDs   AM Labs Ordered, also please review Full Orders  Family Communication: Admission, patients condition and plan of care including tests being ordered have been discussed with the patient and wife who indicate understanding and agree with the plan and Code Status.  Code Status Full  Likely DC to  Pending PT evaluation after Surgery.  Condition GUARDED    Time spent in minutes  : 50 minutes    Dajsha Massaro M.D on 05/17/2015 at 5:47 PM  Between 7am to 7pm - Pager - [phone]  After 7pm go to [email or web address] - password [email or web address]  And look for the night coverage person covering me after hours  [facility] Office  [phone]

## 2015-05-17 NOTE — Anesthesia Preprocedure Evaluation (Addendum)
Anesthesia Evaluation  Patient identified by MRN, date of birth, ID band Patient awake    Reviewed: Allergy & Precautions, H&P , NPO status , Patient's Chart, lab work & pertinent test results  Airway Mallampati: II  TM Distance: >3 FB Neck ROM: Full    Dental no notable dental hx. (+) Dental Advisory Given, Partial Lower, Missing   Pulmonary neg pulmonary ROS,    Pulmonary exam normal breath sounds clear to auscultation       Cardiovascular hypertension, Pt. on medications + CAD, + CABG (LIMA to LAD, SVG sequential to first and second OM, SVG to distal RCA 2009) and + Peripheral Vascular Disease  + dysrhythmias (RBBB)  Rhythm:Regular Rate:Normal + Systolic murmurs    Neuro/Psych negative neurological ROS  negative psych ROS   GI/Hepatic negative GI ROS, Neg liver ROS,   Endo/Other  negative endocrine ROS  Renal/GU negative Renal ROS  negative genitourinary   Musculoskeletal   Abdominal   Peds  Hematology  (+) Blood dyscrasia, anemia ,   Anesthesia Other Findings   Reproductive/Obstetrics negative OB ROS                           Anesthesia Physical Anesthesia Plan  ASA: III  Anesthesia Plan: General   Post-op Pain Management:    Induction: Intravenous  Airway Management Planned: Oral ETT  Additional Equipment:   Intra-op Plan:   Post-operative Plan: Extubation in OR  Informed Consent: I have reviewed the patients History and Physical, chart, labs and discussed the procedure including the risks, benefits and alternatives for the proposed anesthesia with the patient or authorized representative who has indicated his/her understanding and acceptance.   Dental advisory given  Plan Discussed with: CRNA  Anesthesia Plan Comments: (Risks/benefits of general anesthesia discussed with patient including risk of damage to teeth, lips, gum, and tongue, nausea/vomiting, allergic  reactions to medications, and the possibility of heart attack, stroke and death.  All patient questions answered.  Patient wishes to proceed.)        Anesthesia Quick Evaluation

## 2015-05-18 ENCOUNTER — Encounter (HOSPITAL_COMMUNITY): Payer: Self-pay | Admitting: *Deleted

## 2015-05-18 LAB — BASIC METABOLIC PANEL
ANION GAP: 6 (ref 5–15)
BUN: 24 mg/dL — ABNORMAL HIGH (ref 6–20)
CALCIUM: 8.7 mg/dL — AB (ref 8.9–10.3)
CO2: 26 mmol/L (ref 22–32)
Chloride: 109 mmol/L (ref 101–111)
Creatinine, Ser: 1.07 mg/dL (ref 0.61–1.24)
GFR, EST NON AFRICAN AMERICAN: 60 mL/min — AB (ref >60)
Glucose, Bld: 113 mg/dL — ABNORMAL HIGH (ref 65–99)
Potassium: 4.7 mmol/L (ref 3.5–5.1)
SODIUM: 141 mmol/L (ref 135–145)

## 2015-05-18 LAB — CBC
HEMATOCRIT: 26.6 % — AB (ref 39.0–52.0)
Hemoglobin: 9 g/dL — ABNORMAL LOW (ref 13.0–17.0)
MCH: 33.8 pg (ref 26.0–34.0)
MCHC: 33.8 g/dL (ref 30.0–36.0)
MCV: 100 fL (ref 78.0–100.0)
PLATELETS: 221 10*3/uL (ref 150–400)
RBC: 2.66 MIL/uL — ABNORMAL LOW (ref 4.22–5.81)
RDW: 12.9 % (ref 11.5–15.5)
WBC: 8.9 10*3/uL (ref 4.0–10.5)

## 2015-05-18 LAB — SURGICAL PCR SCREEN
MRSA, PCR: NEGATIVE
Staphylococcus aureus: NEGATIVE

## 2015-05-18 LAB — ABO/RH: ABO/RH(D): O POS

## 2015-05-18 MED ORDER — ENOXAPARIN SODIUM 40 MG/0.4ML ~~LOC~~ SOLN: 40.0000 mg | Freq: Once | SUBCUTANEOUS | Status: DC

## 2015-05-18 NOTE — Evaluation (Addendum)
Physical Therapy Evaluation Patient Details Name: [name] MRN: [id number] DOB: [birth year] Today's Date: 05/18/2015   History of Present Illness  Pt is 82 y/o male s/p fall with R distal femur fx.  Surgery scheduled for 05/19/15.  PT order for L TKR 05/08/15 by Dr Gladstone Lighter.  PT with other hx of CAD, RBBB, dementia and Cabg  Clinical Impression  PT admitted as above and with limited eval completed this session.  Pt with no OOB activity orders pending R LE surgery for fx repair.  PT initiated for ROM/ther ex recent L TKR.  More complete eval and setting of goals to be completed following R LE surgery.    Follow Up Recommendations SNF    Equipment Recommendations  None recommended by PT    Recommendations for Other Services       Precautions / Restrictions Precautions Precautions: Other (comment) Precaution Comments: PT for ROM/ther ex L TKR only until post op for most recent fx Required Braces or Orthoses: Knee Immobilizer - Right Knee Immobilizer - Right: On at all times Restrictions Weight Bearing Restrictions: Yes RLE Weight Bearing: Non weight bearing Other Position/Activity Restrictions: PT for L TKR ROM/therex only      Mobility  Bed Mobility               General bed mobility comments: NT - no OOB activity until post op.  Transfers                    Ambulation/Gait                Stairs            Wheelchair Mobility    Modified Rankin (Stroke Patients Only)       Balance                                             Pertinent Vitals/Pain Pain Assessment: Faces Faces Pain Scale: Hurts whole lot Pain Location: R LE with muscle spasms.  4/10 L knee Pain Descriptors / Indicators: Aching;Spasm;Sore Pain Intervention(s): Limited activity within patient's tolerance;Monitored during session;Premedicated before session;Patient requesting pain meds-RN notified;Ice applied (Muscle relaxor requested and received)     Home Living Family/patient expects to be discharged to:: Skilled nursing facility Living Arrangements: Spouse/significant other Available Help at Discharge: Family Type of Home: House Home Access: Ramped entrance     [place]: Two level;Able to live on main level with bedroom/bathroom Home Equipment: [name] - single point;Walker - 2 wheels      Prior Function Level of Independence: Independent with assistive device(s)         Comments: Pt on RW post op from L TKR.  Pt no longer using KI     Hand Dominance        Extremity/Trunk Assessment   Upper Extremity Assessment: Overall WFL for tasks assessed           Lower Extremity Assessment: RLE deficits/detail;LLE deficits/detail RLE Deficits / Details: NT - KI in place - surgery to repair fx 05/19/15 LLE Deficits / Details: 3/5 quads, AAROM at knee -8 - 85     Communication   Communication: No difficulties  Cognition Arousal/Alertness: Awake/alert Behavior During Therapy: WFL for tasks assessed/performed Overall Cognitive Status: Within Functional Limits for tasks assessed  General Comments      Exercises Total Joint Exercises Ankle Circles/Pumps: AROM;Left;15 reps;Supine Quad Sets: AROM;Left;15 reps;Supine Heel Slides: AAROM;Left;20 reps;Supine Straight Leg Raises: Left;AAROM;AROM;15 reps      Assessment/Plan    PT Assessment Patient needs continued PT services  PT Diagnosis Difficulty walking;Acute pain   PT Problem List Decreased strength;Decreased range of motion;Decreased activity tolerance;Decreased balance;Decreased mobility;Decreased knowledge of use of DME;Pain  PT Treatment Interventions Functional mobility training;Gait training;DME instruction;Patient/family education;Therapeutic activities;Therapeutic exercise   PT Goals (Current goals can be found in the Care Plan section) Acute Rehab PT Goals Patient Stated Goal: Regain IND PT Goal Formulation: With  patient Time For Goal Achievement: 05/15/15 Potential to Achieve Goals: Good    Frequency Min 6X/week   Barriers to discharge        Co-evaluation               End of Session Equipment Utilized During Treatment: Right knee immobilizer Activity Tolerance: Patient tolerated treatment well Patient left: in bed;with call bell/phone within reach;with family/visitor present Nurse Communication: Mobility status         Time: [id number] PT Time Calculation (min) (ACUTE ONLY): 25 min   Charges:   PT Evaluation $PT Eval Low Complexity: 1 Procedure     PT G Codes:        Briarrose Shor 06/17/15, 4:06 PM

## 2015-05-18 NOTE — Progress Notes (Signed)
   Subjective:  Patient reports pain as mild to moderate.  C/O right thigh pain.  Objective:   VITALS:   Filed Vitals:   05/17/15 2004 05/17/15 2024 05/17/15 2318 05/18/15 0600  BP:  120/43 130/57 132/50  Pulse:  68 64 65  Temp:  98.2 F (36.8 C) 98.2 F (36.8 C) 98.9 F (37.2 C)  TempSrc:  Oral Oral Oral  Resp:  16 16 16   Height: 5\' 7"  (1.702 m)     Weight: 78.926 kg (174 lb)     SpO2:  98% 96% 100%    ABD soft Sensation intact distally Intact pulses distally Compartment soft deformity to right thigh. incision healed.  Lab Results  Component Value Date   WBC 8.9 05/18/2015   HGB 9.0* 05/18/2015   HCT 26.6* 05/18/2015   MCV 100.0 05/18/2015   PLT 221 05/18/2015   BMET    Component Value Date/Time   NA 141 05/18/2015 0426   K 4.7 05/18/2015 0426   CL 109 05/18/2015 0426   CO2 26 05/18/2015 0426   GLUCOSE 113* 05/18/2015 0426   BUN 24* 05/18/2015 0426   CREATININE 1.07 05/18/2015 0426   CREATININE 1.16* 12/21/2014 1119   CALCIUM 8.7* 05/18/2015 0426   GFRNONAA 60* 05/18/2015 0426   GFRAA >60 05/18/2015 0426     Assessment/Plan:     Principal Problem:   Fracture of femoral shaft, right, closed ([place]) Active Problems:   CAD (coronary artery disease)s/p CABG (LIMA to LAD, SVG sequential to first and second OM, SVG to distal RCA) 2009   Second degree AV block, Mobitz type I   Dyslipidemia   HTN (hypertension)   Femur fracture, right ([facility])   Hip abductor tendinitis   NWB RLE Best for now Knee immobilizer NPO after MN tonight Plan for surgery tomorrow: IM fixation right periprosthetic femur fracture Hold chemical DVT ppx   Davidson Palmieri, Horald Pollen 05/18/2015, 7:53 AM   Rod Can, MD Cell [phone]

## 2015-05-18 NOTE — Progress Notes (Signed)
Patient Demographics  [name], is a 82 y.o. male, DOB - [birth year], [id number]  Admit date - 05/17/2015   Admitting Physician Albertine Patricia, MD  Outpatient Primary MD for the patient is Leola Brazil, MD  LOS - 1   Chief Complaint  Patient presents with  . Fall        Subjective:   [name] today has, No headache, No chest pain, No abdominal pain - No Nausea, [place] pain is controlled.  Assessment & Plan    Principal Problem:   Fracture of femoral shaft, right, closed ([place]) Active Problems:   CAD (coronary artery disease)s/p CABG (LIMA to LAD, SVG sequential to first and second OM, SVG to distal RCA) 2009   Second degree AV block, Mobitz type I   Dyslipidemia   HTN (hypertension)   Femur fracture, right (HCC)   Hip abductor tendinitis  Right distal femoral shaft fracture - Status post mechanical fall , Orthopedic consult greatly appreciated, plan for surgical repair tomorrow a.m. - Patient is moderate risk for surgical intervention given his age, and history of coronary artery disease, EKG with no acute changes, denies any chest pain, shortness of breath or palpitation, at this point no further workup indicated prior to surgery. -Continue with when necessary pain and nausea medication. - Hip fracture order set utilized on admission - Continue with when necessary pain medication  History of carotid artery disease - Denies any chest pain or shortness of breath, will resume aspirin after surgery(or can be started on aspirin 325 mg twice a day postoperatively for DVT prophylaxis)  Hypertension - Continue amlodipine, continue to hold hydrochlorothiazide  Dyslipidemia - Continue statin  Code Status: Full  Family Communication: son and wife at bedside  Disposition Plan: likely will need SNF   Procedures  none   Consults    ortho   Medications  Scheduled Meds: . amLODipine  2.5 mg Oral Daily  . atorvastatin  20 mg Oral QHS  . docusate sodium  100 mg Oral BID  . donepezil  5 mg Oral QHS  . tamsulosin  0.4 mg Oral QPC supper   Continuous Infusions:  PRN Meds:.HYDROcodone-acetaminophen, methocarbamol **OR** methocarbamol (ROBAXIN)  IV, morphine injection, polyethylene glycol  DVT Prophylaxis   SCD, hold chemical DVT prophylaxis as per orthopedic recommendation  Lab Results  Component Value Date   PLT 221 05/18/2015    Antibiotics    Anti-infectives    None          Objective:   Filed Vitals:   05/17/15 2024 05/17/15 2318 05/18/15 0600 05/18/15 0935  BP: 120/43 130/57 132/50 138/52  Pulse: 68 64 65 72  Temp: 98.2 F (36.8 C) 98.2 F (36.8 C) 98.9 F (37.2 C) 98.5 F (36.9 C)  TempSrc: Oral Oral Oral Axillary  Resp: 16 16 16 18   Height:      Weight:      SpO2: 98% 96% 100% 99%    Wt Readings from Last 3 Encounters:  05/17/15 78.926 kg (174 lb)  05/08/15 76.658 kg (169 lb)  05/01/15 76.658 kg (169 lb)     Intake/Output Summary (Last 24 hours) at 05/18/15 1344 Last data filed at 05/18/15 0935  Gross per 24 hour  Intake  580 ml  Output    500 ml  Net     80 ml     Physical Exam  Awake Alert, Oriented X 3, No new F.N deficits, Normal affect [place].AT,PERRAL Supple Neck,No JVD, No cervical lymphadenopathy appriciated.  Symmetrical Chest wall movement, Good air movement bilaterally, CTAB RRR,No Gallops,Rubs or new Murmurs, No Parasternal Heave +ve B.Sounds, Abd Soft, No tenderness,  No rebound - guarding or rigidity. No Cyanosis, Distal pulses felt bilaterally, right lower extremity on immobilizer, left lower extremity with bandage around right anterior surgical wound status post knee replacement surgery.   Data Review   Micro Results No results found for this or any previous visit (from the past 240 hour(s)).  Radiology Reports Dg Chest 2 View  05/01/2015   CLINICAL DATA:  Preoperative examination. Patient for knee replacement. Initial encounter. EXAM: CHEST  2 VIEW COMPARISON:  PA and lateral chest 06/02/2007. FINDINGS: The lungs are clear. The patient is status post CABG. Eventration of the right hemidiaphragm is unchanged. Heart size is upper normal. No pneumothorax or pleural effusion. IMPRESSION: No acute disease. Electronically Signed   By: Inge Rise M.D.   On: 05/01/2015 14:24   Dg Chest Port 1 View  05/17/2015  CLINICAL DATA:  Preoperative for ORIF right femur fracture EXAM: PORTABLE CHEST 1 VIEW COMPARISON:  05/01/2015 chest radiograph. FINDINGS: Sternotomy wires appear aligned and intact. CABG clips overlie the mediastinum. Stable cardiomediastinal silhouette with top-normal heart size. No pneumothorax. No pleural effusion. Lungs appear clear, with no acute consolidative airspace disease and no pulmonary edema. IMPRESSION: No active disease. Electronically Signed   By: Ilona Sorrel M.D.   On: 05/17/2015 16:34   Dg Femur, Min 2 Views Right  05/17/2015  CLINICAL DATA:  82 year old male who fell at home. Pain. Initial encounter. EXAM: RIGHT FEMUR 2 VIEWS COMPARISON:  None. FINDINGS: Right femoral head normally located. Proximal right femur appears intact. Visible right hemipelvis grossly intact. Previous right total knee arthroplasty. Comminuted spiral fracture extending from about 14 cm proximal to the knee to the anterior superior margin of the femoral hardware (arrow image 4). Anterior displacement 1 full shaft width. Moderate anterior angulation. Over riding of fracture fragments up to 4 cm. Mild medial displacement and lateral angulation. Anterior displacement of a large butterfly fragment. Incidental external zipper artifact along the lateral knee. The knee arthroplasty components remain normally aligned. Dystrophic calcification about the patella. There does appear to be hemorrhage within the joint. Grossly intact proximal tibia and fibula.  IMPRESSION: 1. Long segment comminuted spiral fracture involving the distal third right femoral shaft and extending to the proximal margin of the right knee femoral implant. Large butterfly fragment. 2. One full shaft wdith anterior displacement with 4 cm of overriding, and anterior as well as medial angulation. 3. Suggestion of right knee hemarthrosis, but the knee hardware appears normally aligned. 4. Proximal right femur intact. Electronically Signed   By: Genevie Ann M.D.   On: 05/17/2015 16:11     CBC  Recent Labs Lab 05/17/15 1629 05/18/15 0426  WBC 9.4 8.9  HGB 10.8* 9.0*  HCT 32.3* 26.6*  PLT 265 221  MCV 100.0 100.0  MCH 33.4 33.8  MCHC 33.4 33.8  RDW 12.8 12.9  LYMPHSABS 1.1  --   MONOABS 0.6  --   EOSABS 0.1  --   BASOSABS 0.0  --     Chemistries   Recent Labs Lab 05/17/15 1629 05/18/15 0426  NA 138 141  K 5.1 4.7  CL 106 109  CO2 27 26  GLUCOSE 124* 113*  BUN 25* 24*  CREATININE 1.29* 1.07  CALCIUM 9.0 8.7*   ------------------------------------------------------------------------------------------------------------------ estimated creatinine clearance is 44.6 mL/min (by C-G formula based on Cr of 1.07). ------------------------------------------------------------------------------------------------------------------ No results for input(s): HGBA1C in the last 72 hours. ------------------------------------------------------------------------------------------------------------------ No results for input(s): CHOL, HDL, LDLCALC, TRIG, CHOLHDL, LDLDIRECT in the last 72 hours. ------------------------------------------------------------------------------------------------------------------ No results for input(s): TSH, T4TOTAL, T3FREE, THYROIDAB in the last 72 hours.  Invalid input(s): FREET3 ------------------------------------------------------------------------------------------------------------------ No results for input(s): VITAMINB12, FOLATE, FERRITIN,  TIBC, IRON, RETICCTPCT in the last 72 hours.  Coagulation profile  Recent Labs Lab 05/17/15 1629  INR 1.09    No results for input(s): DDIMER in the last 72 hours.  Cardiac Enzymes No results for input(s): CKMB, TROPONINI, MYOGLOBIN in the last 168 hours.  Invalid input(s): CK ------------------------------------------------------------------------------------------------------------------ Invalid input(s): POCBNP     Time Spent in minutes   25 minutes   Deontaye Civello M.D on 05/18/2015 at 1:44 PM  Between 7am to 7pm - Pager - [phone]  After 7pm go to [email or web address] - password [facility]  [facility]   Office  [phone]

## 2015-05-18 NOTE — Op Note (Signed)
NAMEREAGEN, LONGENBERGER NO.:  [id number]  MEDICAL RECORD NO.:  [id number]  LOCATION:  [id number]                         FACILITY:  [facility]  PHYSICIAN:  Susa Day, M.D.    DATE OF BIRTH:  09-12-1924  DATE OF PROCEDURE: DATE OF DISCHARGE:                              OPERATIVE REPORT   PREOPERATIVE DIAGNOSIS:  Displaced right comminuted distal femur fracture, periprosthetic.  POSTOPERATIVE DIAGNOSIS:  Displaced right comminuted distal femur fracture, periprosthetic.  PROCEDURE PERFORMED:  Closed reduction of the right femur placed in a knee immobilizer with IV sedation.  HISTORY:  This 82 year old, who fell, sustaining distal femur fracture periprosthetic, had a history of the total knee, presented to the emergency room with a deformity of the lower extremity and was indicated for provisional closed reduction and mobilization to proceed definitive intramedullary rodding.  Risks and benefits discussed.  TECHNIQUE:  With the patient in supine position, after induction of IV fentanyl and cardiovascular monitoring,gentle longitudinal traction was applied to lower extremity.  A gentle reduction was appreciated with slightly numb the extremity noted.  With longitudinal traction placed, knee immobilizer was placed by the orthopedic technician.  This was secured and following this, the patient had excellent pulses and was neurovascularly intact.  He was admitted from the emergency room to the floor for serial neurovascular monitoring followed by delayed intramedullary rodding. Discussed this with Dr. Lyla Glassing.  We performed procedure on Sunday.     Susa Day, M.D.     Geralynn Rile  D:  05/17/2015  T:  05/18/2015  Job:  [id number]

## 2015-05-19 ENCOUNTER — Encounter (HOSPITAL_COMMUNITY): Payer: Self-pay | Admitting: Anesthesiology

## 2015-05-19 ENCOUNTER — Encounter (HOSPITAL_COMMUNITY)
Admission: EM | Disposition: A | Discharge status: Skilled Nursing Facility | Payer: Self-pay | Source: Home / Self Care | Attending: Internal Medicine

## 2015-05-19 ENCOUNTER — Inpatient Hospital Stay (HOSPITAL_COMMUNITY): Payer: Medicare Other | Admitting: Anesthesiology

## 2015-05-19 ENCOUNTER — Inpatient Hospital Stay (HOSPITAL_COMMUNITY): Payer: Medicare Other

## 2015-05-19 DIAGNOSIS — S72453A Displaced supracondylar fracture without intracondylar extension of lower end of unspecified femur, initial encounter for closed fracture: Secondary | ICD-10-CM | POA: Diagnosis present

## 2015-05-19 DIAGNOSIS — S72301D Unspecified fracture of shaft of right femur, subsequent encounter for closed fracture with routine healing: Secondary | ICD-10-CM

## 2015-05-19 HISTORY — PX: FEMUR IM NAIL: SHX1597

## 2015-05-19 LAB — BASIC METABOLIC PANEL
Anion gap: 6 (ref 5–15)
BUN: 22 mg/dL — ABNORMAL HIGH (ref 6–20)
CHLORIDE: 112 mmol/L — AB (ref 101–111)
CO2: 25 mmol/L (ref 22–32)
CREATININE: 1.11 mg/dL (ref 0.61–1.24)
Calcium: 8.7 mg/dL — ABNORMAL LOW (ref 8.9–10.3)
GFR calc non Af Amer: 57 mL/min — ABNORMAL LOW (ref >60)
Glucose, Bld: 108 mg/dL — ABNORMAL HIGH (ref 65–99)
POTASSIUM: 4.5 mmol/L (ref 3.5–5.1)
Sodium: 143 mmol/L (ref 135–145)

## 2015-05-19 LAB — CBC
HEMATOCRIT: 25.3 % — AB (ref 39.0–52.0)
HEMOGLOBIN: 8.6 g/dL — AB (ref 13.0–17.0)
MCH: 33.7 pg (ref 26.0–34.0)
MCHC: 34 g/dL (ref 30.0–36.0)
MCV: 99.2 fL (ref 78.0–100.0)
Platelets: 224 10*3/uL (ref 150–400)
RBC: 2.55 MIL/uL — AB (ref 4.22–5.81)
RDW: 12.8 % (ref 11.5–15.5)
WBC: 9.6 10*3/uL (ref 4.0–10.5)

## 2015-05-19 LAB — PREPARE RBC (CROSSMATCH)

## 2015-05-19 SURGERY — INSERTION, INTRAMEDULLARY ROD, FEMUR, RETROGRADE
Anesthesia: General | Site: Leg Upper | Laterality: Right

## 2015-05-19 MED ORDER — ISOPROPYL ALCOHOL 70 % SOLN
Status: DC | PRN
  Administered 2015-05-19: 1 via TOPICAL

## 2015-05-19 MED ORDER — KETAMINE HCL 10 MG/ML IJ SOLN
INTRAMUSCULAR | Status: DC | PRN
  Administered 2015-05-19 (×4): 10 mg via INTRAVENOUS
  Administered 2015-05-19: 20 mg via INTRAVENOUS

## 2015-05-19 MED ORDER — DEXAMETHASONE SODIUM PHOSPHATE 10 MG/ML IJ SOLN
INTRAMUSCULAR | Status: AC
  Filled 2015-05-19: qty 1

## 2015-05-19 MED ORDER — HALOPERIDOL LACTATE 5 MG/ML IJ SOLN
5.0000 mg | Freq: Four times a day (QID) | INTRAMUSCULAR | Status: DC | PRN
  Administered 2015-05-19: 5 mg via INTRAVENOUS
  Filled 2015-05-19: qty 1

## 2015-05-19 MED ORDER — HYDROGEN PEROXIDE 3 % EX SOLN
CUTANEOUS | Status: DC | PRN
  Administered 2015-05-19: 1

## 2015-05-19 MED ORDER — VANCOMYCIN HCL IN DEXTROSE 1-5 GM/200ML-% IV SOLN
INTRAVENOUS | Status: AC
  Filled 2015-05-19: qty 200

## 2015-05-19 MED ORDER — LACTATED RINGERS IV SOLN
INTRAVENOUS | Status: DC | PRN
  Administered 2015-05-19 (×2): via INTRAVENOUS

## 2015-05-19 MED ORDER — EPHEDRINE SULFATE 50 MG/ML IJ SOLN
INTRAMUSCULAR | Status: DC | PRN
  Administered 2015-05-19 (×4): 10 mg via INTRAVENOUS
  Administered 2015-05-19 (×2): 5 mg via INTRAVENOUS

## 2015-05-19 MED ORDER — ACETAMINOPHEN 10 MG/ML IV SOLN
1000.0000 mg | INTRAVENOUS | Status: AC
  Administered 2015-05-19: 1000 mg via INTRAVENOUS
  Filled 2015-05-19: qty 100

## 2015-05-19 MED ORDER — HYDROCODONE-ACETAMINOPHEN 5-325 MG PO TABS
1.0000 | ORAL_TABLET | Freq: Four times a day (QID) | ORAL | Status: DC | PRN
  Administered 2015-05-19: 2 via ORAL
  Administered 2015-05-20: 1 via ORAL
  Administered 2015-05-20: 2 via ORAL
  Administered 2015-05-21 (×3): 1 via ORAL
  Filled 2015-05-19: qty 2
  Filled 2015-05-19 (×2): qty 1
  Filled 2015-05-19: qty 2
  Filled 2015-05-19 (×2): qty 1

## 2015-05-19 MED ORDER — METOCLOPRAMIDE HCL 5 MG/ML IJ SOLN: 5.0000 mg | Freq: Three times a day (TID) | INTRAMUSCULAR | Status: DC | PRN

## 2015-05-19 MED ORDER — ONDANSETRON HCL 4 MG/2ML IJ SOLN
INTRAMUSCULAR | Status: AC
  Filled 2015-05-19: qty 2

## 2015-05-19 MED ORDER — POVIDONE-IODINE 10 % EX SOLN
CUTANEOUS | Status: DC | PRN
  Administered 2015-05-19: 1 via TOPICAL

## 2015-05-19 MED ORDER — EPHEDRINE SULFATE 50 MG/ML IJ SOLN
INTRAMUSCULAR | Status: AC
  Filled 2015-05-19: qty 1

## 2015-05-19 MED ORDER — MENTHOL 3 MG MT LOZG: 1.0000 | LOZENGE | OROMUCOSAL | Status: DC | PRN

## 2015-05-19 MED ORDER — SUGAMMADEX SODIUM 200 MG/2ML IV SOLN
INTRAVENOUS | Status: AC
  Filled 2015-05-19: qty 2

## 2015-05-19 MED ORDER — ROCURONIUM BROMIDE 100 MG/10ML IV SOLN
INTRAVENOUS | Status: DC | PRN
  Administered 2015-05-19 (×3): 10 mg via INTRAVENOUS
  Administered 2015-05-19: 50 mg via INTRAVENOUS

## 2015-05-19 MED ORDER — ONDANSETRON HCL 4 MG/2ML IJ SOLN: 4.0000 mg | Freq: Once | INTRAMUSCULAR | Status: DC | PRN

## 2015-05-19 MED ORDER — KETAMINE HCL 10 MG/ML IJ SOLN
INTRAMUSCULAR | Status: AC
  Filled 2015-05-19: qty 1

## 2015-05-19 MED ORDER — SUCCINYLCHOLINE CHLORIDE 20 MG/ML IJ SOLN
INTRAMUSCULAR | Status: DC | PRN
  Administered 2015-05-19: 80 mg via INTRAVENOUS

## 2015-05-19 MED ORDER — 0.9 % SODIUM CHLORIDE (POUR BTL) OPTIME
TOPICAL | Status: DC | PRN
  Administered 2015-05-19: 5500 mL

## 2015-05-19 MED ORDER — SODIUM CHLORIDE 0.9 % IJ SOLN
INTRAMUSCULAR | Status: AC
  Filled 2015-05-19: qty 10

## 2015-05-19 MED ORDER — SUFENTANIL CITRATE 50 MCG/ML IV SOLN
INTRAVENOUS | Status: AC
  Filled 2015-05-19: qty 1

## 2015-05-19 MED ORDER — PROPOFOL 10 MG/ML IV BOLUS
INTRAVENOUS | Status: AC
  Filled 2015-05-19: qty 20

## 2015-05-19 MED ORDER — ACETAMINOPHEN 325 MG PO TABS
650.0000 mg | ORAL_TABLET | Freq: Four times a day (QID) | ORAL | Status: DC | PRN
Start: 2015-05-19 — End: 2015-05-21
  Administered 2015-05-20: 650 mg via ORAL
  Filled 2015-05-19: qty 2

## 2015-05-19 MED ORDER — CEFAZOLIN SODIUM-DEXTROSE 2-4 GM/100ML-% IV SOLN
2.0000 g | Freq: Four times a day (QID) | INTRAVENOUS | Status: AC
  Administered 2015-05-19 (×2): 2 g via INTRAVENOUS
  Filled 2015-05-19 (×2): qty 100

## 2015-05-19 MED ORDER — PHENOL 1.4 % MT LIQD: 1.0000 | OROMUCOSAL | Status: DC | PRN

## 2015-05-19 MED ORDER — LACTATED RINGERS IV SOLN
INTRAVENOUS | Status: DC | PRN
  Administered 2015-05-19: 07:00:00 via INTRAVENOUS

## 2015-05-19 MED ORDER — ONDANSETRON HCL 4 MG/2ML IJ SOLN
INTRAMUSCULAR | Status: DC | PRN
  Administered 2015-05-19: 4 mg via INTRAVENOUS

## 2015-05-19 MED ORDER — SODIUM CHLORIDE 0.9 % IV SOLN
INTRAVENOUS | Status: DC | PRN
  Administered 2015-05-19: 08:00:00 via INTRAVENOUS

## 2015-05-19 MED ORDER — ENOXAPARIN SODIUM 40 MG/0.4ML ~~LOC~~ SOLN
40.0000 mg | SUBCUTANEOUS | Status: DC
  Administered 2015-05-20 – 2015-05-21 (×2): 40 mg via SUBCUTANEOUS
  Filled 2015-05-19 (×3): qty 0.4

## 2015-05-19 MED ORDER — STERILE WATER FOR IRRIGATION IR SOLN
Status: DC | PRN
  Administered 2015-05-19: 2000 mL

## 2015-05-19 MED ORDER — PROPOFOL 10 MG/ML IV BOLUS
INTRAVENOUS | Status: DC | PRN
  Administered 2015-05-19: 100 mg via INTRAVENOUS

## 2015-05-19 MED ORDER — SUGAMMADEX SODIUM 200 MG/2ML IV SOLN
INTRAVENOUS | Status: DC | PRN
  Administered 2015-05-19: 160 mg via INTRAVENOUS

## 2015-05-19 MED ORDER — SODIUM CHLORIDE 0.9 % IV SOLN
Freq: Once | INTRAVENOUS | Status: DC
Start: 2015-05-19 — End: 2015-05-19

## 2015-05-19 MED ORDER — LIDOCAINE HCL (CARDIAC) 20 MG/ML IV SOLN
INTRAVENOUS | Status: DC | PRN
  Administered 2015-05-19: 50 mg via INTRAVENOUS

## 2015-05-19 MED ORDER — HYDROMORPHONE HCL 2 MG/ML IJ SOLN
INTRAMUSCULAR | Status: AC
  Filled 2015-05-19: qty 1

## 2015-05-19 MED ORDER — DEXAMETHASONE SODIUM PHOSPHATE 10 MG/ML IJ SOLN
INTRAMUSCULAR | Status: DC | PRN
  Administered 2015-05-19: 10 mg via INTRAVENOUS

## 2015-05-19 MED ORDER — SUFENTANIL CITRATE 50 MCG/ML IV SOLN
INTRAVENOUS | Status: DC | PRN
  Administered 2015-05-19 (×3): 10 ug via INTRAVENOUS
  Administered 2015-05-19: 15 ug via INTRAVENOUS
  Administered 2015-05-19: 5 ug via INTRAVENOUS

## 2015-05-19 MED ORDER — BISACODYL 10 MG RE SUPP
10.0000 mg | Freq: Every day | RECTAL | Status: DC | PRN
  Administered 2015-05-21: 10 mg via RECTAL
  Filled 2015-05-19: qty 1

## 2015-05-19 MED ORDER — METOCLOPRAMIDE HCL 5 MG PO TABS
5.0000 mg | ORAL_TABLET | Freq: Three times a day (TID) | ORAL | Status: DC | PRN
  Filled 2015-05-19: qty 2

## 2015-05-19 MED ORDER — CEFAZOLIN SODIUM-DEXTROSE 2-3 GM-% IV SOLR
INTRAVENOUS | Status: AC
  Filled 2015-05-19: qty 50

## 2015-05-19 MED ORDER — SODIUM CHLORIDE 0.9 % IV SOLN
1000.0000 mg | INTRAVENOUS | Status: AC
  Administered 2015-05-19: 1000 mg via INTRAVENOUS
  Filled 2015-05-19: qty 10

## 2015-05-19 MED ORDER — ONDANSETRON HCL 4 MG/2ML IJ SOLN: 4.0000 mg | Freq: Four times a day (QID) | INTRAMUSCULAR | Status: DC | PRN

## 2015-05-19 MED ORDER — HYDROMORPHONE HCL 1 MG/ML IJ SOLN
INTRAMUSCULAR | Status: DC | PRN
  Administered 2015-05-19 (×4): .4 mg via INTRAVENOUS

## 2015-05-19 MED ORDER — FLEET ENEMA 7-19 GM/118ML RE ENEM: 1.0000 | ENEMA | Freq: Every day | RECTAL | Status: DC | PRN

## 2015-05-19 MED ORDER — LIDOCAINE HCL (CARDIAC) 20 MG/ML IV SOLN
INTRAVENOUS | Status: AC
  Filled 2015-05-19: qty 5

## 2015-05-19 MED ORDER — ONDANSETRON HCL 4 MG PO TABS: 4.0000 mg | ORAL_TABLET | Freq: Four times a day (QID) | ORAL | Status: DC | PRN

## 2015-05-19 MED ORDER — MORPHINE SULFATE (PF) 10 MG/ML IV SOLN: 0.5000 mg | INTRAVENOUS | Status: DC | PRN

## 2015-05-19 MED ORDER — PHENYLEPHRINE 40 MCG/ML (10ML) SYRINGE FOR IV PUSH (FOR BLOOD PRESSURE SUPPORT)
PREFILLED_SYRINGE | INTRAVENOUS | Status: AC
  Filled 2015-05-19: qty 10

## 2015-05-19 MED ORDER — FENTANYL CITRATE (PF) 100 MCG/2ML IJ SOLN
25.0000 ug | INTRAMUSCULAR | Status: DC | PRN
Start: 2015-05-19 — End: 2015-05-19

## 2015-05-19 MED ORDER — ACETAMINOPHEN 10 MG/ML IV SOLN
INTRAVENOUS | Status: AC
  Filled 2015-05-19: qty 100

## 2015-05-19 MED ORDER — MORPHINE SULFATE (PF) 2 MG/ML IV SOLN
0.5000 mg | INTRAVENOUS | Status: DC | PRN
  Administered 2015-05-19: 0.5 mg via INTRAVENOUS
  Filled 2015-05-19: qty 1

## 2015-05-19 MED ORDER — ACETAMINOPHEN 650 MG RE SUPP
650.0000 mg | Freq: Four times a day (QID) | RECTAL | Status: DC | PRN
  Filled 2015-05-19: qty 1

## 2015-05-19 MED ORDER — VANCOMYCIN HCL IN DEXTROSE 1-5 GM/200ML-% IV SOLN
1000.0000 mg | INTRAVENOUS | Status: AC
  Administered 2015-05-19: 1000 mg via INTRAVENOUS

## 2015-05-19 MED ORDER — CEFAZOLIN SODIUM-DEXTROSE 2-4 GM/100ML-% IV SOLN
2.0000 g | INTRAVENOUS | Status: AC
  Administered 2015-05-19: 2 g via INTRAVENOUS
  Filled 2015-05-19: qty 100

## 2015-05-19 SURGICAL SUPPLY — 49 items
BAG SPEC THK2 15X12 ZIP CLS (MISCELLANEOUS) ×1
BAG ZIPLOCK 12X15 (MISCELLANEOUS) ×2 IMPLANT
BANDAGE ELASTIC 6 VELCRO ST LF (GAUZE/BANDAGES/DRESSINGS) ×2 IMPLANT
BIT DRILL CALIBRATED 4.3MMX365 (DRILL) IMPLANT
BIT DRILL CROWE PNT TWST 4.5MM (DRILL) IMPLANT
CABLE CERLAGE W/CRIMP 1.8 (Cable) ×2 IMPLANT
CABLE CERLAGE W/CRIMP 1.8MM (Cable) ×2 IMPLANT
CHLORAPREP W/TINT 26ML (MISCELLANEOUS) ×3 IMPLANT
COVER PERINEAL POST (MISCELLANEOUS) ×1 IMPLANT
DRAPE C-ARM 42X120 X-RAY (DRAPES) ×3 IMPLANT
DRAPE C-ARMOR (DRAPES) ×3 IMPLANT
DRAPE ORTHO SPLIT 77X108 STRL (DRAPES) ×3
DRAPE STERI IOBAN 125X83 (DRAPES) ×5 IMPLANT
DRAPE SURG ORHT 6 SPLT 77X108 (DRAPES) IMPLANT
DRAPE U-SHAPE 47X51 STRL (DRAPES) ×8 IMPLANT
DRILL CALIBRATED 4.3MMX365 (DRILL) ×3
DRILL CROWE POINT TWIST 4.5MM (DRILL) ×3
DRSG AQUACEL AG ADV 3.5X 6 (GAUZE/BANDAGES/DRESSINGS) ×2 IMPLANT
DRSG AQUACEL AG ADV 3.5X10 (GAUZE/BANDAGES/DRESSINGS) ×2 IMPLANT
DRSG AQUACEL AG ADV 3.5X14 (GAUZE/BANDAGES/DRESSINGS) ×2 IMPLANT
DRSG MEPILEX BORDER 4X4 (GAUZE/BANDAGES/DRESSINGS) ×9 IMPLANT
ELECT BLADE TIP CTD 4 INCH (ELECTRODE) ×3 IMPLANT
GAUZE SPONGE 4X4 12PLY STRL (GAUZE/BANDAGES/DRESSINGS) ×3 IMPLANT
GLOVE BIO SURGEON STRL SZ8.5 (GLOVE) ×6 IMPLANT
GLOVE BIOGEL PI IND STRL 8.5 (GLOVE) ×1 IMPLANT
GLOVE BIOGEL PI INDICATOR 8.5 (GLOVE) ×2
GOWN SPEC L3 XXLG W/TWL (GOWN DISPOSABLE) ×6 IMPLANT
GUIDEPIN 3.2X17.5 THRD DISP (PIN) ×2 IMPLANT
GUIDEWIRE BEAD TIP (WIRE) ×2 IMPLANT
KIT BASIN OR (CUSTOM PROCEDURE TRAY) ×3 IMPLANT
LIQUID BAND (GAUZE/BANDAGES/DRESSINGS) ×8 IMPLANT
MANIFOLD NEPTUNE II (INSTRUMENTS) ×3 IMPLANT
MARKER SKIN DUAL TIP RULER LAB (MISCELLANEOUS) ×3 IMPLANT
NAIL FEM RETRO 10.5X360 (Nail) ×2 IMPLANT
PACK TOTAL JOINT (CUSTOM PROCEDURE TRAY) ×3 IMPLANT
SCREW CORT TI DBL LEAD 5X32 (Screw) ×2 IMPLANT
SCREW CORT TI DBL LEAD 5X70 (Screw) ×2 IMPLANT
SCREW CORT TI DBL LEAD 5X85 (Screw) ×2 IMPLANT
SCREW CORT TI DBLE LEAD 5X54 (Screw) ×2 IMPLANT
SEALER BIPOLAR AQUA 6.0 (INSTRUMENTS) ×2 IMPLANT
SUT MNCRL AB 3-0 PS2 18 (SUTURE) ×8 IMPLANT
SUT MON AB 2-0 CT1 36 (SUTURE) ×2 IMPLANT
SUT VIC AB 1 CT1 27 (SUTURE) ×9
SUT VIC AB 1 CT1 27XBRD ANTBC (SUTURE) ×2 IMPLANT
SUT VIC AB 2-0 CT1 27 (SUTURE) ×6
SUT VIC AB 2-0 CT1 27XBRD (SUTURE) ×2 IMPLANT
SUT VLOC 180 0 24IN GS25 (SUTURE) ×4 IMPLANT
WRAP KNEE MAXI GEL POST OP (GAUZE/BANDAGES/DRESSINGS) ×2 IMPLANT
YANKAUER SUCT BULB TIP NO VENT (SUCTIONS) ×3 IMPLANT

## 2015-05-19 NOTE — Brief Op Note (Signed)
05/17/2015 - 05/19/2015  10:39 AM  PATIENT:  [name]  82 y.o. male  PRE-OPERATIVE DIAGNOSIS:  Right distal femur fracture- periprosthetic  POST-OPERATIVE DIAGNOSIS:  Right distal femur fracture- periprosthetic  PROCEDURE:  Procedure(s): INTRAMEDULLARY (IM) RETROGRADE FEMORAL NAILING with ZIMMER CABLES (Right)  SURGEON:  Surgeon(s) and Role:    * Rod Can, MD - Primary  PHYSICIAN ASSISTANT:   ASSISTANTS: Nehemiah Massed, PA-C.   ANESTHESIA:   general  EBL:  Total I/O In: 2012 [I.V.:1400; Blood:612] Out: 500 [Urine:200; Blood:300]  BLOOD ADMINISTERED: 2 units PRBCs  DRAINS: none   LOCAL MEDICATIONS USED:  NONE  SPECIMEN:  No Specimen  DISPOSITION OF SPECIMEN:  N/A  COUNTS:  YES  TOURNIQUET:  * No tourniquets in log *  DICTATION: .Other Dictation: Dictation Number [id number]  PLAN OF CARE: Admit to inpatient   PATIENT DISPOSITION:  PACU - hemodynamically stable.   Delay start of Pharmacological VTE agent (>24hrs) due to surgical blood loss or risk of bleeding: no

## 2015-05-19 NOTE — Transfer of Care (Signed)
Immediate Anesthesia Transfer of Care Note  Patient: [name]  Procedure(s) Performed: Procedure(s): INTRAMEDULLARY (IM) RETROGRADE FEMORAL NAILING with ZIMMER CABLES (Right)  Patient Location: PACU  Anesthesia Type:General  Level of Consciousness: sedated  Airway & Oxygen Therapy: Patient Spontanous Breathing and Patient connected to face mask oxygen  Post-op Assessment: Report given to RN and Post -op Vital signs reviewed and stable  Post vital signs: Reviewed and stable  Last Vitals:  Filed Vitals:   05/19/15 0436 05/19/15 0617  BP: 151/67 151/57  Pulse: 80 81  Temp: 37 C 36.8 C  Resp: 16     Complications: No apparent anesthesia complications

## 2015-05-19 NOTE — Progress Notes (Signed)
Patient Demographics  [name], is a 82 y.o. male, DOB - [birth year], [id number]  Admit date - 05/17/2015   Admitting Physician Albertine Patricia, MD  Outpatient Primary MD for the patient is Leola Brazil, MD  LOS - 2   Chief Complaint  Patient presents with  . Fall        Subjective:   [name] today has, No headache, No chest pain, No abdominal pain - No Nausea,   Assessment & Plan    Principal Problem:   Fracture of femoral shaft, right, closed ([place]) Active Problems:   CAD (coronary artery disease)s/p CABG (LIMA to LAD, SVG sequential to first and second OM, SVG to distal RCA) 2009   Second degree AV block, Mobitz type I   Dyslipidemia   HTN (hypertension)   Femur fracture, right (HCC)   Hip abductor tendinitis   Fracture, femur, supracondylar (HCC)  Right distal femoral shaft fracture - Status post mechanical fall , Orthopedic consult greatly appreciated, Went for surgical repair today by Dr. Lyla Glassing - Patient is moderate risk for surgical intervention given his age, and history of coronary artery disease, EKG with no acute changes, denies any chest pain, shortness of breath or palpitation, at this point no further workup indicated prior to surgery. -Continue with when necessary pain and nausea medication. - Hip fracture order set utilized on admission - Consult PT, to see disposition plan   History of carotid artery disease - Denies any chest pain or shortness of breath, will resume aspirin after surgery(or can be started on aspirin 325 mg twice a day postoperatively for DVT prophylaxis)  Hypertension - Continue amlodipine, continue to hold hydrochlorothiazide  Dyslipidemia - Continue statin  Code Status: Full  Family Communication: son and wife at bedside  Disposition Plan: likely will need SNF   Procedures  INTRAMEDULLARY (IM) RETROGRADE  FEMORAL NAILING with ZIMMER CABLES (Right) by Dr Lyla Glassing on 4/2    Consults   ortho   Medications  Scheduled Meds: . amLODipine  2.5 mg Oral Daily  . atorvastatin  20 mg Oral QHS  .  ceFAZolin (ANCEF) IV  2 g Intravenous Q6H  . docusate sodium  100 mg Oral BID  . donepezil  5 mg Oral QHS  . [START ON 05/20/2015] enoxaparin (LOVENOX) injection  40 mg Subcutaneous Q24H  . tamsulosin  0.4 mg Oral QPC supper   Continuous Infusions:  PRN Meds:.acetaminophen **OR** acetaminophen, haloperidol lactate, HYDROcodone-acetaminophen, menthol-cetylpyridinium **OR** phenol, metoCLOPramide **OR** metoCLOPramide (REGLAN) injection, morphine injection, ondansetron **OR** ondansetron (ZOFRAN) IV, polyethylene glycol  DVT Prophylaxis   SCD, will start lovenox tomorrow.  Lab Results  Component Value Date   PLT 224 05/19/2015    Antibiotics    Anti-infectives    Start     Dose/Rate Route Frequency Ordered Stop   05/19/15 1400  ceFAZolin (ANCEF) IVPB 2g/100 mL premix     2 g 200 mL/hr over 30 Minutes Intravenous Every 6 hours 05/19/15 1313 05/20/15 0159   05/19/15 0645  ceFAZolin (ANCEF) IVPB 2g/100 mL premix     2 g 200 mL/hr over 30 Minutes Intravenous To Surgery 05/19/15 0640 05/19/15 0800   05/19/15 0645  vancomycin (VANCOCIN) IVPB 1000 mg/200 mL premix     1,000  mg 200 mL/hr over 60 Minutes Intravenous To Surgery 05/19/15 0640 05/19/15 0832          Objective:   Filed Vitals:   05/19/15 1230 05/19/15 1245 05/19/15 1418 05/19/15 1422  BP: 168/84 129/94  163/75  Pulse: 91 86  103  Temp: 98.8 F (37.1 C) 97.8 F (36.6 C)  98.2 F (36.8 C)  TempSrc:    Tympanic  Resp: 17 18  18   Height:      Weight:      SpO2: 100% 100% 90% 100%    Wt Readings from Last 3 Encounters:  05/17/15 78.926 kg (174 lb)  05/08/15 76.658 kg (169 lb)  05/01/15 76.658 kg (169 lb)     Intake/Output Summary (Last 24 hours) at 05/19/15 1442 Last data filed at 05/19/15 1423  Gross per 24 hour   Intake   3862 ml  Output   2350 ml  Net   1512 ml     Physical Exam  Awake Alert, Oriented X 3, No new F.N deficits, Normal affect [place].AT,PERRAL Supple Neck,No JVD, No cervical lymphadenopathy appriciated.  Symmetrical Chest wall movement, Good air movement bilaterally, CTAB RRR,No Gallops,Rubs or new Murmurs, No Parasternal Heave +ve B.Sounds, Abd Soft, No tenderness,  No rebound - guarding or rigidity. No Cyanosis, Distal pulses felt bilaterally, right lower extremity on immobilizer, left lower extremity with bandage around right anterior surgical wound status post knee replacement surgery.   Data Review   Micro Results Recent Results (from the past 240 hour(s))  Surgical pcr screen     Status: None   Collection Time: 05/18/15  4:40 PM  Result Value Ref Range Status   MRSA, PCR NEGATIVE NEGATIVE Final   Staphylococcus aureus NEGATIVE NEGATIVE Final    Comment:        The Xpert SA Assay (FDA approved for NASAL specimens in patients over 2 years of age), is one component of a comprehensive surveillance program.  Test performance has been validated by [facility] for patients greater than or equal to 27 year old. It is not intended to diagnose infection nor to guide or monitor treatment.     Radiology Reports Dg Chest 2 View  05/01/2015  CLINICAL DATA:  Preoperative examination. Patient for knee replacement. Initial encounter. EXAM: CHEST  2 VIEW COMPARISON:  PA and lateral chest 06/02/2007. FINDINGS: The lungs are clear. The patient is status post CABG. Eventration of the right hemidiaphragm is unchanged. Heart size is upper normal. No pneumothorax or pleural effusion. IMPRESSION: No acute disease. Electronically Signed   By: Inge Rise M.D.   On: 05/01/2015 14:24   Dg Chest Port 1 View  05/17/2015  CLINICAL DATA:  Preoperative for ORIF right femur fracture EXAM: PORTABLE CHEST 1 VIEW COMPARISON:  05/01/2015 chest radiograph. FINDINGS: Sternotomy wires appear  aligned and intact. CABG clips overlie the mediastinum. Stable cardiomediastinal silhouette with top-normal heart size. No pneumothorax. No pleural effusion. Lungs appear clear, with no acute consolidative airspace disease and no pulmonary edema. IMPRESSION: No active disease. Electronically Signed   By: Ilona Sorrel M.D.   On: 05/17/2015 16:34   Dg C-arm 61-120 Min-no Report  05/19/2015  CLINICAL DATA: surgery C-ARM 61-120 MINUTES Fluoroscopy was utilized by the requesting physician.  No radiographic interpretation.   Dg Femur, Min 2 Views Right  05/19/2015  CLINICAL DATA:  ORIF right femur fracture. EXAM: DG C-ARM 61-120 MIN-NO REPORT; RIGHT FEMUR 2 VIEWS COMPARISON:  05/17/2015 FINDINGS: Four intraoperative spot views of the right femur  are submitted postoperatively for interpretation. An intramedullary nail within the right femur is identified traversing a oblique fracture of the distal right femur, in near-anatomic alignment position. No gross complicating features noted. IMPRESSION: Placement of right femoral intramedullary nail traversing a distal right femur fracture, in near-anatomic alignment and position. Electronically Signed   By: Margarette Canada M.D.   On: 05/19/2015 10:27   Dg Femur, Min 2 Views Right  05/17/2015  CLINICAL DATA:  82 year old male who fell at home. Pain. Initial encounter. EXAM: RIGHT FEMUR 2 VIEWS COMPARISON:  None. FINDINGS: Right femoral head normally located. Proximal right femur appears intact. Visible right hemipelvis grossly intact. Previous right total knee arthroplasty. Comminuted spiral fracture extending from about 14 cm proximal to the knee to the anterior superior margin of the femoral hardware (arrow image 4). Anterior displacement 1 full shaft width. Moderate anterior angulation. Over riding of fracture fragments up to 4 cm. Mild medial displacement and lateral angulation. Anterior displacement of a large butterfly fragment. Incidental external zipper artifact along  the lateral knee. The knee arthroplasty components remain normally aligned. Dystrophic calcification about the patella. There does appear to be hemorrhage within the joint. Grossly intact proximal tibia and fibula. IMPRESSION: 1. Long segment comminuted spiral fracture involving the distal third right femoral shaft and extending to the proximal margin of the right knee femoral implant. Large butterfly fragment. 2. One full shaft wdith anterior displacement with 4 cm of overriding, and anterior as well as medial angulation. 3. Suggestion of right knee hemarthrosis, but the knee hardware appears normally aligned. 4. Proximal right femur intact. Electronically Signed   By: Genevie Ann M.D.   On: 05/17/2015 16:11   Dg Femur Port, Min 2 Views Right  05/19/2015  CLINICAL DATA:  Internal fixation of right femur fracture. EXAM: RIGHT FEMUR PORTABLE 1 VIEW COMPARISON:  Prior studies FINDINGS: An intra medullary nail is identified within the right femur, traversing a distal femoral fracture, in near-anatomic alignment and position. Right total knee arthroplasty changes are present. No definite complicating hardware features noted. IMPRESSION: IM nail traversing a distal right femur fracture, in near-anatomic alignment and position. No definite complicating features. Electronically Signed   By: Margarette Canada M.D.   On: 05/19/2015 12:44     CBC  Recent Labs Lab 05/17/15 1629 05/18/15 0426 05/19/15 0411  WBC 9.4 8.9 9.6  HGB 10.8* 9.0* 8.6*  HCT 32.3* 26.6* 25.3*  PLT 265 221 224  MCV 100.0 100.0 99.2  MCH 33.4 33.8 33.7  MCHC 33.4 33.8 34.0  RDW 12.8 12.9 12.8  LYMPHSABS 1.1  --   --   MONOABS 0.6  --   --   EOSABS 0.1  --   --   BASOSABS 0.0  --   --     Chemistries   Recent Labs Lab 05/17/15 1629 05/18/15 0426 05/19/15 0411  NA 138 141 143  K 5.1 4.7 4.5  CL 106 109 112*  CO2 27 26 25   GLUCOSE 124* 113* 108*  BUN 25* 24* 22*  CREATININE 1.29* 1.07 1.11  CALCIUM 9.0 8.7* 8.7*    ------------------------------------------------------------------------------------------------------------------ estimated creatinine clearance is 43 mL/min (by C-G formula based on Cr of 1.11). ------------------------------------------------------------------------------------------------------------------ No results for input(s): HGBA1C in the last 72 hours. ------------------------------------------------------------------------------------------------------------------ No results for input(s): CHOL, HDL, LDLCALC, TRIG, CHOLHDL, LDLDIRECT in the last 72 hours. ------------------------------------------------------------------------------------------------------------------ No results for input(s): TSH, T4TOTAL, T3FREE, THYROIDAB in the last 72 hours.  Invalid input(s): FREET3 ------------------------------------------------------------------------------------------------------------------ No results for input(s): VITAMINB12, FOLATE,  FERRITIN, TIBC, IRON, RETICCTPCT in the last 72 hours.  Coagulation profile  Recent Labs Lab 05/17/15 1629  INR 1.09    No results for input(s): DDIMER in the last 72 hours.  Cardiac Enzymes No results for input(s): CKMB, TROPONINI, MYOGLOBIN in the last 168 hours.  Invalid input(s): CK ------------------------------------------------------------------------------------------------------------------ Invalid input(s): POCBNP     Time Spent in minutes   25 minutes   Haruki Arnold M.D on 05/19/2015 at 2:42 PM  Between 7am to 7pm - Pager - [phone]  After 7pm go to [email or web address] - password [facility]  [facility]   Office  [phone]

## 2015-05-19 NOTE — Anesthesia Postprocedure Evaluation (Signed)
Anesthesia Post Note  Patient: [name]  Procedure(s) Performed: Procedure(s) (LRB): INTRAMEDULLARY (IM) RETROGRADE FEMORAL NAILING with ZIMMER CABLES (Right)  Patient location during evaluation: PACU Anesthesia Type: General Level of consciousness: awake and alert Pain management: pain level controlled Vital Signs Assessment: post-procedure vital signs reviewed and stable Respiratory status: spontaneous breathing, nonlabored ventilation, respiratory function stable and patient connected to nasal cannula oxygen Cardiovascular status: blood pressure returned to baseline and stable Postop Assessment: no signs of nausea or vomiting Anesthetic complications: no    Last Vitals:  Filed Vitals:   05/19/15 1841 05/19/15 2016  BP: 137/64 133/64  Pulse: 77 63  Temp: 36.9 C 36.6 C  Resp: 17 16    Last Pain:  Filed Vitals:   05/19/15 2016  PainSc: Taylor Edward Turk

## 2015-05-19 NOTE — Interval H&P Note (Signed)
History and Physical Interval Note:  05/19/2015 6:56 AM  [name]  has presented today for surgery, with the diagnosis of Right distal femur fracture  The various methods of treatment have been discussed with the patient and family. After consideration of risks, benefits and other options for treatment, the patient has consented to  Procedure(s): INTRAMEDULLARY (IM) RETROGRADE FEMORAL NAILING (Right) as a surgical intervention .  The patient's history has been reviewed, patient examined, no change in status, stable for surgery.  I have reviewed the patient's chart and labs.  Questions were answered to the patient's satisfaction.    The risks, benefits, and alternatives were discussed with the patient. There are risks associated with the surgery including, but not limited to, problems with anesthesia (death), infection, differences in leg length/angulation/rotation, fracture of bones, loosening or failure of implants, malunion, nonunion, stiffness, hematoma (blood accumulation) which may require surgical drainage, blood clots, pulmonary embolism, nerve injury (foot drop), and blood vessel injury. The patient understands these risks and elects to proceed.    Parker Wherley, Horald Pollen

## 2015-05-19 NOTE — Anesthesia Procedure Notes (Signed)
Procedure Name: Intubation Date/Time: 05/19/2015 7:53 AM Performed by: Danley Danker L Patient Re-evaluated:Patient Re-evaluated prior to inductionOxygen Delivery Method: Circle system utilized Preoxygenation: Pre-oxygenation with 100% oxygen Intubation Type: IV induction Ventilation: Mask ventilation without difficulty Laryngoscope Size: Mac and 4 Grade View: Grade I Tube type: Oral Tube size: 8.0 mm Number of attempts: 1 Airway Equipment and Method: Stylet Placement Confirmation: ETT inserted through vocal cords under direct vision,  breath sounds checked- equal and bilateral and positive ETCO2 Secured at: 21 cm Tube secured with: Tape Dental Injury: Teeth and Oropharynx as per pre-operative assessment  Comments: Intubation by Dr. Gifford Shave

## 2015-05-19 NOTE — Op Note (Signed)
NAMEMarland Kitchen  RHOAN, MCQUARTER NO.:  [id number]  MEDICAL RECORD NO.:  [id number]  LOCATION:  [facility]                         FACILITY:  [facility]  PHYSICIAN:  Rod Can, MD     DATE OF BIRTH:  May 03, 1924  DATE OF PROCEDURE:  05/19/2015 DATE OF DISCHARGE:                              OPERATIVE REPORT   SURGEON:  Rod Can, MD  ASSISTANT:  Nehemiah Massed, PA-C  PREOPERATIVE DIAGNOSIS:  Periprosthetic right distal femur fracture.  POSTOPERATIVE DIAGNOSIS:  Periprosthetic right distal femur fracture.  PROCEDURE PERFORMED:  Open reduction, intramedullary fixation of right supracondylar periprosthetic distal femur fracture.  ANESTHESIA:  General.  ANTIBIOTICS: 1. 2 g Ancef. 2. 1 g vancomycin.  IMPLANTS: 1. Biomet [place] retrograde nail 10.5 x 360 mm. 2. 5.0 mm distal interlocking screws x3. 3. 5.0 mm proximal locking screw x1. 4. 1.8-mm adult reconstruction cables x2.  COMPLICATIONS:  None.  SPECIMENS:  None.  TUBES AND DRAINS:  None.  DISPOSITION:  Stable to PACU.  INDICATIONS:  The patient is an 83 year old male, who has a remote history of right total knee arthroplasty, Stryker Scorpio implants.  He had a recent left total knee arthroplasty by Dr. Gladstone Lighter, and was recently discharged from the hospital.  He had a ground level fall at home, fell onto his right knee.  He had right thigh pain, inability to weight bear.  He had an obvious deformity.  He was taken to the hospital.  X-rays showed a distal femoral periprosthetic fracture that was proximal to the anterior flange of the knee replacement.  He was admitted to the Hospitalist Service.  He underwent perioperative risk stratification, medical optimization.  Risks, benefits, alternatives to above-mentioned procedure were explained and he elected to proceed.  DESCRIPTION OF PROCEDURE IN DETAIL:  I identified the patient in the holding area using 2 identifiers.  The surgical site was marked  by myself.  He was taken to the operating room.  General anesthesia was induced on his bed.  He was then transferred to the operating room table.  All bony prominences were well padded.  A bump was placed under the right hip.  No tourniquet was utilized.  The right lower extremity was prepped and draped in the normal sterile surgical fashion.  Time-out was called verifying side and site of surgery.  I began by bringing in fluoroscopy.  A triangle was placed under the distal femur. I attempted to reduce the fracture with traction and appropriate varus valgus angulation.  I was able to get a reasonable reduction on the lateral view.  On the AP view, the fracture was very difficult to reduce.  He did have a butterfly fragment involved with a distal femoral fragment. The main fracture line actually was a short oblique fracture, was very difficult to reduce.  I made a longitudinal incision over the lateral aspect of the femur, I split the IT band.  I lifted the vastus anteriorly.  I identified the fracture.  I placed a cerclage cable around the butterfly fracture fragment of the distal piece.  I then used clamps to reduce the fracture.  I held the reduction with a [place] claw and I placed an additional adult reconstruction  cable around the short oblique fracture.  The cables were tightened.  I had a near anatomic reduction on the AP and lateral views.  I then turned my attention to his knee.  I utilized his previous longitudinal incision.  I sharply excised the scar with a 10 blade.  I created full-thickness skin flaps.  I made a medial parapatellar arthrotomy.  I came into the knee.  I evacuated the fracture hematoma.  I did a medial release.  I excised the infrapatellar scar.  I examined his polyethylene liner.  There was no layer or oxidation or abnormalities.  I used a guide pin to get the standard starting point for a retrograde femoral nail.  I used the entry reamer, placed a guidewire  to the lesser trochanter.  I measured, I reamed to a 12.  I placed a real nail through the jig.  I placed 2 transverse distal locking screws and 1 oblique locking screw.  I tightened down the locking sleeve.  I removed the jig.  Fracture reduction remained unchanged and near anatomic.  I used a perfect circle technique to place 1 proximal interlocking screw.  Final AP and lateral fluoroscopy views were obtained.  The nail was seated to a good depth that was not sitting out proud through the knee replacement.  I irrigated all the wounds.  I reinspected his polyethylene liner.  There was no damage to the posterior surface itself.  Therefore, I elected not to perform a polyethylene liner exchange.  I clipped the cables.  All the wounds were irrigated.  The knee itself was irrigated with a dilute Betadine solution followed by saline.  The wound was closed in layers with 1 Vicryl for the fascia, 0 V-Loc for the knee arthrotomy, 2-0 Monocryl for the deep dermal layer, skin for staples.  Glue was applied.  Once the glue was hardened, Aquacel dressings were applied.  Compressive Ace wrap was placed.  The patient was then extubated, taken to PACU in stable condition.  Sponge, needle, and instrument counts were correct at the end of the case x2.  There were no known complications.  PLAN:  I discussed the operative events and findings with patient's family.  He will be readmitted to the hospitalist.  He may be touchdown weightbearing to the right lower extremity.  He will need physical therapy for gait training, range of motion to the right knee.  We will place him on Lovenox for DVT prophylaxis.  He will likely need skilled nursing facility placement.  I will need to see him in the office 2 weeks after discharge.  All questions solicited and answered.          ______________________________ Rod Can, MD     BS/MEDQ  D:  05/19/2015  T:  05/19/2015  Job:  [id number]

## 2015-05-19 NOTE — Clinical Social Work Note (Signed)
Clinical Social Work Assessment  Patient Details  Name: [name] MRN: [id number] Date of Birth: 07-02-1924  Date of referral:  05/19/15               Reason for consult:  Facility Placement                Permission sought to share information with:  Family Supports Permission granted to share information::  Yes, Verbal Permission Granted  Name::     [name]  Agency::  [facility]  Relationship::  wife  Contact Information:     Housing/Transportation Living arrangements for the past 2 months:  Single Family Home Source of Information:  Patient Patient Interpreter Needed:  None Criminal Activity/Legal Involvement Pertinent to Current Situation/Hospitalization:  No - Comment as needed Significant Relationships:  Adult Children, Spouse Lives with:  Spouse Do you feel safe going back to the place where you live?  No Need for family participation in patient care:  No (Coment)  Care giving concerns: Pt lives at home with elderly wife- current requiring max assist with femur fracture on one leg and recent knee replacement on the other   Social Worker assessment / plan:  CSW spoke with pt and pt family/friends at bedside (son, wife, Environmental education officer).  CSW discussed MD/PT recommendation for SNF and explained what SNF is.  CSW explained referral process for SNF placement.  Pt states he has never been but expressed understanding of what SNF stay would look like.  Employment status:  Retired Forensic scientist:  Medicare PT Recommendations:  [place] / Referral to community resources:  [place]  Patient/Family's Response to care:  Pt is agreeable to SNF stay and is understanding that he would not be capable of returning home since he can not ambulate at this time with both legs impaired.  Patient/Family's Understanding of and Emotional Response to Diagnosis, Current Treatment, and Prognosis:  Pt and family are agreeable to plan and  express good understanding of pt current treatment plan.  Pt and family are hopeful that pt will make quick recovery and not have any more unfortunate set backs.  Emotional Assessment Appearance:  Appears stated age Attitude/Demeanor/Rapport:    Affect (typically observed):  Appropriate, Pleasant Orientation:  Oriented to Self, Oriented to Place, Oriented to  Time, Oriented to Situation Alcohol / Substance use:  Not Applicable Psych involvement (Current and /or in the community):  No (Comment)  Discharge Needs  Concerns to be addressed:  Care Coordination, Discharge Planning Concerns Readmission within the last 30 days:  Yes Current discharge risk:  Physical Impairment Barriers to Discharge:  Continued Medical Work up   William Mon, LCSW 05/19/2015, 3:27 PM

## 2015-05-19 NOTE — H&P (View-Only) (Signed)
Reason for Consult: Right leg pain Referring Physician: EDP   [name] is an 82 y.o. male.  HPI: [place] today getting out of chair   Past Medical History  Diagnosis Date  . CAD (coronary artery disease)   . Essential and other specified forms of tremor 07/15/2012  . RBBB   . Mobitz type 2 second degree atrioventricular block   . HTN (hypertension) 01/30/2013  . Urinary frequency   . Cancer ([place])     hx of bladder cancer- 2002   . Dementia due to general medical condition without behavioral disturbance     unspecified (per wife) dx at [facility]    Past Surgical History  Procedure Laterality Date  . Knee surgery Right 2006  . Coronary artery bypass graft  05/10/2007    LIMA to LAD, SVG to 1st and 2nd OM and SVG to RCA  . Cardiac catheterization  04/27/2007    3 vessel disease  . Coronary angioplasty  1988  . Coronary angioplasty with stent placement  1998    LAD  . US echocardiography  10/01/2010    mild concentric LVH,LA mildly dilated,RA mild to mod. dilated,mild MR,mild to mod. TR,mild AI,mild PI,sinus rhythm w/pauses  . Nm myocar perf wall motion  04/20/2007    mod. reversible ischemia  . Total knee arthroplasty Left 05/08/2015    Procedure: LEFT TOTAL KNEE ARTHROPLASTY;  Surgeon: Latanya Maudlin, MD;  Location: [facility];  Service: Orthopedics;  Laterality: Left;    Family History  Problem Relation Age of Onset  . Pneumonia Mother   . Cancer Father   . Cancer Brother     Social History:  reports that he has never smoked. He has never used smokeless tobacco. He reports that he does not drink alcohol or use illicit drugs.  Allergies: No Known Allergies  Medications: Reviewed  Results for orders placed or performed during the hospital encounter of 05/17/15 (from the past 48 hour(s))  CBC with Differential     Status: Abnormal   Collection Time: 05/17/15  4:29 PM  Result Value Ref Range   WBC 9.4 4.0 - 10.5 K/uL   RBC 3.23 (L) 4.22 - 5.81 MIL/uL   Hemoglobin 10.8 (L)  13.0 - 17.0 g/dL   HCT 32.3 (L) 39.0 - 52.0 %   MCV 100.0 78.0 - 100.0 fL   MCH 33.4 26.0 - 34.0 pg   MCHC 33.4 30.0 - 36.0 g/dL   RDW 12.8 11.5 - 15.5 %   Platelets 265 150 - 400 K/uL   Neutrophils Relative % 80 %   Neutro Abs 7.6 1.7 - 7.7 K/uL   Lymphocytes Relative 12 %   Lymphs Abs 1.1 0.7 - 4.0 K/uL   Monocytes Relative 7 %   Monocytes Absolute 0.6 0.1 - 1.0 K/uL   Eosinophils Relative 1 %   Eosinophils Absolute 0.1 0.0 - 0.7 K/uL   Basophils Relative 0 %   Basophils Absolute 0.0 0.0 - 0.1 K/uL  Basic metabolic panel     Status: Abnormal   Collection Time: 05/17/15  4:29 PM  Result Value Ref Range   Sodium 138 135 - 145 mmol/L   Potassium 5.1 3.5 - 5.1 mmol/L   Chloride 106 101 - 111 mmol/L   CO2 27 22 - 32 mmol/L   Glucose, Bld 124 (H) 65 - 99 mg/dL   BUN 25 (H) 6 - 20 mg/dL   Creatinine, Ser 1.29 (H) 0.61 - 1.24 mg/dL   Calcium 9.0 8.9 -  10.3 mg/dL   GFR calc non Af Amer 48 (L) >60 mL/min   GFR calc Af Amer 55 (L) >60 mL/min    Comment: (NOTE) The eGFR has been calculated using the CKD EPI equation. This calculation has not been validated in all clinical situations. eGFR's persistently <60 mL/min signify possible Chronic Kidney Disease.    Anion gap 5 5 - 15  Type and screen     Status: None   Collection Time: 05/17/15  4:29 PM  Result Value Ref Range   ABO/RH(D) O POS    Antibody Screen NEG    Sample Expiration 05/20/2015   Protime-INR     Status: None   Collection Time: 05/17/15  4:29 PM  Result Value Ref Range   Prothrombin Time 14.3 11.6 - 15.2 seconds   INR 1.09 0.00 - 1.49    Dg Chest Port 1 View  05/17/2015  CLINICAL DATA:  Preoperative for ORIF right femur fracture EXAM: PORTABLE CHEST 1 VIEW COMPARISON:  05/01/2015 chest radiograph. FINDINGS: Sternotomy wires appear aligned and intact. CABG clips overlie the mediastinum. Stable cardiomediastinal silhouette with top-normal heart size. No pneumothorax. No pleural effusion. Lungs appear clear, with no  acute consolidative airspace disease and no pulmonary edema. IMPRESSION: No active disease. Electronically Signed   By: Ilona Sorrel M.D.   On: 05/17/2015 16:34   Dg Femur, Min 2 Views Right  05/17/2015  CLINICAL DATA:  82 year old male who fell at home. Pain. Initial encounter. EXAM: RIGHT FEMUR 2 VIEWS COMPARISON:  None. FINDINGS: Right femoral head normally located. Proximal right femur appears intact. Visible right hemipelvis grossly intact. Previous right total knee arthroplasty. Comminuted spiral fracture extending from about 14 cm proximal to the knee to the anterior superior margin of the femoral hardware (arrow image 4). Anterior displacement 1 full shaft width. Moderate anterior angulation. Over riding of fracture fragments up to 4 cm. Mild medial displacement and lateral angulation. Anterior displacement of a large butterfly fragment. Incidental external zipper artifact along the lateral knee. The knee arthroplasty components remain normally aligned. Dystrophic calcification about the patella. There does appear to be hemorrhage within the joint. Grossly intact proximal tibia and fibula. IMPRESSION: 1. Long segment comminuted spiral fracture involving the distal third right femoral shaft and extending to the proximal margin of the right knee femoral implant. Large butterfly fragment. 2. One full shaft wdith anterior displacement with 4 cm of overriding, and anterior as well as medial angulation. 3. Suggestion of right knee hemarthrosis, but the knee hardware appears normally aligned. 4. Proximal right femur intact. Electronically Signed   By: Genevie Ann M.D.   On: 05/17/2015 16:11    Review of Systems  Musculoskeletal: Positive for joint pain.  All other systems reviewed and are negative.  Blood pressure 138/62, pulse 66, temperature 98.5 F (36.9 C), temperature source Oral, resp. rate 12, SpO2 96 %. Physical Exam  Constitutional: He is oriented to person, place, and time. He appears  well-developed.  HENT:  Head: Normocephalic.  Eyes: Pupils are equal, round, and reactive to light.  Neck: Normal range of motion.  Cardiovascular: Normal rate.   Respiratory: Effort normal.  GI: Soft.  Musculoskeletal:  Right LE deformity. NVI. Compartments soft.  Neurological: He is alert and oriented to person, place, and time. He has normal reflexes.  Skin: Skin is warm.  Psychiatric: He has a normal mood and affect.    Assessment/Plan:  Right femur periprosthetic fracture. Plan closed reduction splinting then IM nailing. Discussed with Dr. Delfino Lovett  who will operate on Sunday. Procedure: Performed closed reduction with sedation and placed in [facility]. Dictation# [id number]   Camillo Quadros C 05/17/2015, 7:43 PM  Cell Phone# [phone]

## 2015-05-19 NOTE — Discharge Instructions (Signed)
Dr. Rod Can Adult Hip [facility] Knee Specialist [facility] [address] [place], [place] [address] [phone]   POSTOPERATIVE DIRECTIONS    Rehabilitation, Guidelines Following Surgery   WEIGHT BEARING Partial weight bearing with assist device as directed.  Touch down weight bearing (30%)   HOME CARE INSTRUCTIONS  Remove items at home which could result in a fall. This includes throw rugs or furniture in walking pathways.  Continue medications as instructed at time of discharge.  You may have some home medications which will be placed on hold until you complete the course of blood thinner medication.  4 days after discharge, you may start showering. No tub baths or soaking your incisions. Do not put on socks or shoes without following the instructions of your caregivers.   Sit on chairs with arms. Use the chair arms to help push yourself up when arising.  Arrange for the use of a toilet seat elevator so you are not sitting low.   Walk with walker as instructed.  You may resume a sexual relationship in one month or when given the OK by your caregiver.  Use walker as long as suggested by your caregivers.  Avoid periods of inactivity such as sitting longer than an hour when not asleep. This helps prevent blood clots.  You may return to work once you are cleared by Engineer, production.  Do not drive a car for 6 weeks or until released by your surgeon.  Do not drive while taking narcotics.  Wear elastic stockings for two weeks following surgery during the day but you may remove then at night.  Make sure you keep all of your appointments after your operation with all of your doctors and caregivers. You should call the office at the above phone number and make an appointment for approximately two weeks after the date of your surgery. Please pick up a stool softener and laxative for home use as long as you are requiring pain medications.  ICE to the affected hip  every three hours for 30 minutes at a time and then as needed for pain and swelling. Continue to use ice on the hip for pain and swelling from surgery. You may notice swelling that will progress down to the foot and ankle.  This is normal after surgery.  Elevate the leg when you are not up walking on it.   It is important for you to complete the blood thinner medication as prescribed by your doctor.  Continue to use the breathing machine which will help keep your temperature down.  It is common for your temperature to cycle up and down following surgery, especially at night when you are not up moving around and exerting yourself.  The breathing machine keeps your lungs expanded and your temperature down.  RANGE OF MOTION AND STRENGTHENING EXERCISES  These exercises are designed to help you keep full movement of your hip joint. Follow your caregiver's or physical therapist's instructions. Perform all exercises about fifteen times, three times per day or as directed. Exercise both hips, even if you have had only one joint replacement. These exercises can be done on a training (exercise) mat, on the floor, on a table or on a bed. Use whatever works the best and is most comfortable for you. Use music or television while you are exercising so that the exercises are a pleasant break in your day. This will make your life better with the exercises acting as a break in routine you can  look forward to.  Lying on your back, slowly slide your foot toward your buttocks, raising your knee up off the floor. Then slowly slide your foot back down until your leg is straight again.  Lying on your back spread your legs as far apart as you can without causing discomfort.  Lying on your side, raise your upper leg and foot straight up from the floor as far as is comfortable. Slowly lower the leg and repeat.  Lying on your back, tighten up the muscle in the front of your thigh (quadriceps muscles). You can do this by keeping your  leg straight and trying to raise your heel off the floor. This helps strengthen the largest muscle supporting your knee.  Lying on your back, tighten up the muscles of your buttocks both with the legs straight and with the knee bent at a comfortable angle while keeping your heel on the floor.   SKILLED REHAB INSTRUCTIONS: If the patient is transferred to a skilled rehab facility following release from the hospital, a list of the current medications will be sent to the facility for the patient to continue.  When discharged from the skilled rehab facility, please have the facility set up the patient's [place] prior to being released. Also, the skilled facility will be responsible for providing the patient with their medications at time of release from the facility to include their pain medication and their blood thinner medication. If the patient is still at the rehab facility at time of the two week follow up appointment, the skilled rehab facility will also need to assist the patient in arranging follow up appointment in our office and any transportation needs.  MAKE SURE YOU:  Understand these instructions.  Will watch your condition.  Will get help right away if you are not doing well or get worse.  Pick up stool softner and laxative for home use following surgery while on pain medications. Do NOT remove surgical dressings. They are waterproof, so showers are ok. No tub baths or soaking. Continue to use ice for pain and swelling after surgery. Do not use any lotions or creams on the incision until instructed by your surgeon.

## 2015-05-19 NOTE — NC FL2 (Signed)
[place] LEVEL OF CARE SCREENING TOOL     IDENTIFICATION  Patient Name: [name] Birthdate: August 08, 1924 Sex: male Admission Date (Current Location): 05/17/2015  [facility] and [place] Number:  Herbalist and Address:  [facility],  [place] [address], [place]      Provider Number: [id number]  Attending Physician Name and Address:  Rod Can, MD  Relative Name and Phone Number:       Current Level of Care:   Recommended Level of Care: [place] Prior Approval Number:    Date Approved/Denied:   PASRR Number: [id number]  Discharge Plan: SNF    Current Diagnoses: Patient Active Problem List   Diagnosis Date Noted  . Fracture, femur, supracondylar ([place]) 05/19/2015  . Fracture of femoral shaft, right, closed ([place]) 05/17/2015  . Femur fracture, right ([place]) 05/17/2015  . Hip abductor tendinitis 05/17/2015  . S/P TKR (total knee replacement) using cement 05/08/2015  . HTN (hypertension) 01/30/2013  . Erectile dysfunction 09/17/2012  . RBBB 09/17/2012  . CAD (coronary artery disease)s/p CABG (LIMA to LAD, SVG sequential to first and second OM, SVG to distal RCA) 2009 09/17/2012  . Second degree AV block, Mobitz type I 09/17/2012  . Dyslipidemia 09/17/2012  . Prostatism 09/17/2012  . PAD (peripheral artery disease) ([place]) 09/17/2012  . Essential and other specified forms of tremor 07/15/2012    Orientation RESPIRATION BLADDER Height & Weight     Self, Time, Situation, Place  O2 (2L [place]) Indwelling catheter Weight: 174 lb (78.926 kg) Height:  5\' 7"  (170.2 cm)  BEHAVIORAL SYMPTOMS/MOOD NEUROLOGICAL BOWEL NUTRITION STATUS      Continent Diet (see DC)  AMBULATORY STATUS COMMUNICATION OF NEEDS Skin   Extensive Assist Verbally Surgical wounds                       Personal Care Assistance Level of Assistance  Bathing, Dressing Bathing Assistance: Maximum assistance   Dressing Assistance: Maximum  assistance     Functional Limitations Info             SPECIAL CARE FACTORS FREQUENCY  PT (By licensed PT), OT (By licensed OT)     PT Frequency: 5/wk OT Frequency: 5/wk            Contractures      Additional Factors Info  Code Status, Allergies Code Status Info: FULL Allergies Info: NKA           Current Medications (05/19/2015):  This is the current hospital active medication list Current Facility-Administered Medications  Medication Dose Route Frequency Provider Last Rate Last Dose  . acetaminophen (TYLENOL) tablet 650 mg  650 mg Oral Q6H PRN Rod Can, MD       Or  . acetaminophen (TYLENOL) suppository 650 mg  650 mg Rectal Q6H PRN Rod Can, MD      . amLODipine (NORVASC) tablet 2.5 mg  2.5 mg Oral Daily Albertine Patricia, MD   2.5 mg at 05/18/15 1004  . atorvastatin (LIPITOR) tablet 20 mg  20 mg Oral QHS Albertine Patricia, MD   20 mg at 05/18/15 2140  . ceFAZolin (ANCEF) IVPB 2g/100 mL premix  2 g Intravenous Q6H Rod Can, MD   2 g at 05/19/15 1436  . docusate sodium (COLACE) capsule 100 mg  100 mg Oral BID Albertine Patricia, MD   100 mg at 05/18/15 2140  . donepezil (ARICEPT) tablet 5 mg  5 mg Oral  QHS Albertine Patricia, MD   5 mg at 05/18/15 2140  . [START ON 05/20/2015] enoxaparin (LOVENOX) injection 40 mg  40 mg Subcutaneous Q24H Rod Can, MD      . haloperidol lactate (HALDOL) injection 5 mg  5 mg Intravenous Q6H PRN Albertine Patricia, MD   5 mg at 05/19/15 1436  . HYDROcodone-acetaminophen (NORCO/VICODIN) 5-325 MG per tablet 1-2 tablet  1-2 tablet Oral Q6H PRN Rod Can, MD      . menthol-cetylpyridinium (CEPACOL) lozenge 3 mg  1 lozenge Oral PRN Rod Can, MD       Or  . phenol (CHLORASEPTIC) mouth spray 1 spray  1 spray Mouth/Throat PRN Rod Can, MD      . metoCLOPramide (REGLAN) tablet 5-10 mg  5-10 mg Oral Q8H PRN Rod Can, MD       Or  . metoCLOPramide (REGLAN) injection 5-10 mg  5-10 mg Intravenous Q8H  PRN Rod Can, MD      . morphine 2 MG/ML injection 0.5 mg  0.5 mg Intravenous Q2H PRN Rod Can, MD      . ondansetron (ZOFRAN) tablet 4 mg  4 mg Oral Q6H PRN Rod Can, MD       Or  . ondansetron (ZOFRAN) injection 4 mg  4 mg Intravenous Q6H PRN Rod Can, MD      . polyethylene glycol (MIRALAX / GLYCOLAX) packet 17 g  17 g Oral Daily PRN Albertine Patricia, MD      . tamsulosin (FLOMAX) capsule 0.4 mg  0.4 mg Oral QPC supper Albertine Patricia, MD   0.4 mg at 05/18/15 1749     Discharge Medications: Please see discharge summary for a list of discharge medications.  Relevant Imaging Results:  Relevant Lab Results:   Additional Information SS#: [id number]  Cranford Mon, [place]

## 2015-05-20 ENCOUNTER — Encounter (HOSPITAL_COMMUNITY): Payer: Self-pay | Admitting: Orthopedic Surgery

## 2015-05-20 LAB — CBC
HEMATOCRIT: 25.6 % — AB (ref 39.0–52.0)
HEMOGLOBIN: 9 g/dL — AB (ref 13.0–17.0)
MCH: 32.4 pg (ref 26.0–34.0)
MCHC: 35.2 g/dL (ref 30.0–36.0)
MCV: 92.1 fL (ref 78.0–100.0)
Platelets: 194 10*3/uL (ref 150–400)
RBC: 2.78 MIL/uL — AB (ref 4.22–5.81)
RDW: 15.9 % — AB (ref 11.5–15.5)
WBC: 14.5 10*3/uL — AB (ref 4.0–10.5)

## 2015-05-20 LAB — TYPE AND SCREEN
ABO/RH(D): O POS
ANTIBODY SCREEN: NEGATIVE
UNIT DIVISION: 0
Unit division: 0

## 2015-05-20 LAB — BASIC METABOLIC PANEL
ANION GAP: 7 (ref 5–15)
BUN: 18 mg/dL (ref 6–20)
CALCIUM: 8.5 mg/dL — AB (ref 8.9–10.3)
CO2: 25 mmol/L (ref 22–32)
Chloride: 110 mmol/L (ref 101–111)
Creatinine, Ser: 1.08 mg/dL (ref 0.61–1.24)
GFR calc non Af Amer: 59 mL/min — ABNORMAL LOW (ref >60)
Glucose, Bld: 133 mg/dL — ABNORMAL HIGH (ref 65–99)
Potassium: 4.7 mmol/L (ref 3.5–5.1)
Sodium: 142 mmol/L (ref 135–145)

## 2015-05-20 MED ORDER — ENOXAPARIN SODIUM 40 MG/0.4ML ~~LOC~~ SOLN: 40.0000 mg | SUBCUTANEOUS | Status: DC

## 2015-05-20 MED ORDER — HYDROCODONE-ACETAMINOPHEN 5-325 MG PO TABS: 1.0000 | ORAL_TABLET | ORAL | Status: AC | PRN

## 2015-05-20 MED ORDER — ASPIRIN EC 81 MG PO TBEC
81.0000 mg | DELAYED_RELEASE_TABLET | Freq: Every day | ORAL | Status: DC
  Administered 2015-05-20 – 2015-05-21 (×2): 81 mg via ORAL
  Filled 2015-05-20 (×2): qty 1

## 2015-05-20 NOTE — Care Management Note (Signed)
Case Management Note  Patient Details  Name: [name] MRN: [id number] Date of Birth: [birth year]  Subjective/Objective:   S/p Open reduction, intramedullary fixation of right supracondylar periprosthetic distal femur fracture                 Action/Plan: Discharge planning per CSW  Expected Discharge Date:                  Expected Discharge Plan:  Skilled Nursing Facility  In-House Referral:  Clinical Social Work  Discharge planning Services  CM Consult  Post Acute Care Choice:  NA Choice offered to:  NA  DME Arranged:  N/A DME Agency:  NA  HH Arranged:  NA HH Agency:  NA  Status of Service:  Completed, signed off  Medicare Important Message Given:    Date Medicare IM Given:    Medicare IM give by:    Date Additional Medicare IM Given:    Additional Medicare Important Message give by:     If discussed at [place] of Stay Meetings, dates discussed:    Additional Comments:  Guadalupe Maple, RN 05/20/2015, 10:07 AM [phone]

## 2015-05-20 NOTE — Progress Notes (Signed)
Patient Demographics  [name], is a 82 y.o. male, DOB - 1924-11-28, [id number]  Admit date - 05/17/2015   Admitting Physician Albertine Patricia, MD  Outpatient Primary MD for the patient is Leola Brazil, MD  LOS - 3   Chief Complaint  Patient presents with  . Fall        Subjective:   [name] today has, No headache, No chest pain, No abdominal pain - No Nausea,   Assessment & Plan    Principal Problem:   Fracture of femoral shaft, right, closed ([place]) Active Problems:   CAD (coronary artery disease)s/p CABG (LIMA to LAD, SVG sequential to first and second OM, SVG to distal RCA) 2009   Second degree AV block, Mobitz type I   Dyslipidemia   HTN (hypertension)   Femur fracture, right (HCC)   Hip abductor tendinitis   Fracture, femur, supracondylar (HCC)  Right distal femoral shaft fracture - Status post mechanical fall , Orthopedic consult greatly appreciated, Went for surgical repair 4/2 by Dr. Lyla Glassing -Continue with when necessary pain and nausea medication. - Hip fracture order set utilized on admission - PT recommended SNF placement, seen by Education officer, museum.   History of coronary artery disease - Denies any chest pain or shortness of breath, will resume on aspirin  Hypertension - Continue amlodipine, continue to hold hydrochlorothiazide  Dyslipidemia - Continue statin  Code Status: Full  Family Communication: son and wife at bedside  Disposition Plan: Will need SNF placement in 24 hours if continues to improve   Procedures  INTRAMEDULLARY (IM) RETROGRADE FEMORAL NAILING with ZIMMER CABLES (Right) by Dr Lyla Glassing on 4/2    Consults   ortho   Medications  Scheduled Meds: . amLODipine  2.5 mg Oral Daily  . atorvastatin  20 mg Oral QHS  . docusate sodium  100 mg Oral BID  . donepezil  5 mg Oral QHS  . enoxaparin (LOVENOX) injection  40  mg Subcutaneous Q24H  . tamsulosin  0.4 mg Oral QPC supper   Continuous Infusions:  PRN Meds:.acetaminophen **OR** acetaminophen, bisacodyl, haloperidol lactate, HYDROcodone-acetaminophen, menthol-cetylpyridinium **OR** phenol, metoCLOPramide **OR** metoCLOPramide (REGLAN) injection, morphine injection, ondansetron **OR** ondansetron (ZOFRAN) IV, polyethylene glycol, sodium phosphate  DVT Prophylaxis   SCD, will start lovenox tomorrow.  Lab Results  Component Value Date   PLT 194 05/20/2015    Antibiotics    Anti-infectives    Start     Dose/Rate Route Frequency Ordered Stop   05/19/15 1400  ceFAZolin (ANCEF) IVPB 2g/100 mL premix     2 g 200 mL/hr over 30 Minutes Intravenous Every 6 hours 05/19/15 1313 05/19/15 2024   05/19/15 0645  ceFAZolin (ANCEF) IVPB 2g/100 mL premix     2 g 200 mL/hr over 30 Minutes Intravenous To Surgery 05/19/15 0640 05/19/15 0800   05/19/15 0645  vancomycin (VANCOCIN) IVPB 1000 mg/200 mL premix     1,000 mg 200 mL/hr over 60 Minutes Intravenous To Surgery 05/19/15 0640 05/19/15 0832          Objective:   Filed Vitals:   05/19/15 2016 05/20/15 0205 05/20/15 0937 05/20/15 1407  BP: 133/64 138/61 126/63 133/51  Pulse: 63 74 65 77  Temp: 97.8 F (36.6 C) 98.2 F (36.8  C) 98.3 F (36.8 C) 97.7 F (36.5 C)  TempSrc: Oral Oral Oral Oral  Resp: 16 16 16 16   Height:      Weight:      SpO2: 99% 100% 100% 97%    Wt Readings from Last 3 Encounters:  05/17/15 78.926 kg (174 lb)  05/08/15 76.658 kg (169 lb)  05/01/15 76.658 kg (169 lb)     Intake/Output Summary (Last 24 hours) at 05/20/15 1512 Last data filed at 05/20/15 1301  Gross per 24 hour  Intake    998 ml  Output   2000 ml  Net  -1002 ml     Physical Exam  Awake Alert, Oriented X 3, No new F.N deficits, Normal affect [name].AT,PERRAL Supple Neck,No JVD, No cervical lymphadenopathy appriciated.  Symmetrical Chest wall movement, Good air movement bilaterally, CTAB RRR,No  Gallops,Rubs or new Murmurs, No Parasternal Heave +ve B.Sounds, Abd Soft, No tenderness,  No rebound - guarding or rigidity. No Cyanosis, Distal pulses felt bilaterally, right lower extremity on immobilizer, left lower extremity with bandage around right anterior surgical wound status post knee replacement surgery.   Data Review   Micro Results Recent Results (from the past 240 hour(s))  Surgical pcr screen     Status: None   Collection Time: 05/18/15  4:40 PM  Result Value Ref Range Status   MRSA, PCR NEGATIVE NEGATIVE Final   Staphylococcus aureus NEGATIVE NEGATIVE Final    Comment:        The Xpert SA Assay (FDA approved for NASAL specimens in patients over 30 years of age), is one component of a comprehensive surveillance program.  Test performance has been validated by [facility] for patients greater than or equal to 61 year old. It is not intended to diagnose infection nor to guide or monitor treatment.     Radiology Reports Dg Chest 2 View  05/01/2015  CLINICAL DATA:  Preoperative examination. Patient for knee replacement. Initial encounter. EXAM: CHEST  2 VIEW COMPARISON:  PA and lateral chest 06/02/2007. FINDINGS: The lungs are clear. The patient is status post CABG. Eventration of the right hemidiaphragm is unchanged. Heart size is upper normal. No pneumothorax or pleural effusion. IMPRESSION: No acute disease. Electronically Signed   By: Inge Rise M.D.   On: 05/01/2015 14:24   Dg Chest Port 1 View  05/17/2015  CLINICAL DATA:  Preoperative for ORIF right femur fracture EXAM: PORTABLE CHEST 1 VIEW COMPARISON:  05/01/2015 chest radiograph. FINDINGS: Sternotomy wires appear aligned and intact. CABG clips overlie the mediastinum. Stable cardiomediastinal silhouette with top-normal heart size. No pneumothorax. No pleural effusion. Lungs appear clear, with no acute consolidative airspace disease and no pulmonary edema. IMPRESSION: No active disease. Electronically Signed    By: Ilona Sorrel M.D.   On: 05/17/2015 16:34   Dg C-arm 61-120 Min-no Report  05/19/2015  CLINICAL DATA: surgery C-ARM 61-120 MINUTES Fluoroscopy was utilized by the requesting physician.  No radiographic interpretation.   Dg Femur, Min 2 Views Right  05/19/2015  CLINICAL DATA:  ORIF right femur fracture. EXAM: DG C-ARM 61-120 MIN-NO REPORT; RIGHT FEMUR 2 VIEWS COMPARISON:  05/17/2015 FINDINGS: Four intraoperative spot views of the right femur are submitted postoperatively for interpretation. An intramedullary nail within the right femur is identified traversing a oblique fracture of the distal right femur, in near-anatomic alignment position. No gross complicating features noted. IMPRESSION: Placement of right femoral intramedullary nail traversing a distal right femur fracture, in near-anatomic alignment and position. Electronically Signed   By:  Margarette Canada M.D.   On: 05/19/2015 10:27   Dg Femur, Min 2 Views Right  05/17/2015  CLINICAL DATA:  82 year old male who fell at home. Pain. Initial encounter. EXAM: RIGHT FEMUR 2 VIEWS COMPARISON:  None. FINDINGS: Right femoral head normally located. Proximal right femur appears intact. Visible right hemipelvis grossly intact. Previous right total knee arthroplasty. Comminuted spiral fracture extending from about 14 cm proximal to the knee to the anterior superior margin of the femoral hardware (arrow image 4). Anterior displacement 1 full shaft width. Moderate anterior angulation. Over riding of fracture fragments up to 4 cm. Mild medial displacement and lateral angulation. Anterior displacement of a large butterfly fragment. Incidental external zipper artifact along the lateral knee. The knee arthroplasty components remain normally aligned. Dystrophic calcification about the patella. There does appear to be hemorrhage within the joint. Grossly intact proximal tibia and fibula. IMPRESSION: 1. Long segment comminuted spiral fracture involving the distal third  right femoral shaft and extending to the proximal margin of the right knee femoral implant. Large butterfly fragment. 2. One full shaft wdith anterior displacement with 4 cm of overriding, and anterior as well as medial angulation. 3. Suggestion of right knee hemarthrosis, but the knee hardware appears normally aligned. 4. Proximal right femur intact. Electronically Signed   By: Genevie Ann M.D.   On: 05/17/2015 16:11   Dg Femur Port, Min 2 Views Right  05/19/2015  CLINICAL DATA:  Internal fixation of right femur fracture. EXAM: RIGHT FEMUR PORTABLE 1 VIEW COMPARISON:  Prior studies FINDINGS: An intra medullary nail is identified within the right femur, traversing a distal femoral fracture, in near-anatomic alignment and position. Right total knee arthroplasty changes are present. No definite complicating hardware features noted. IMPRESSION: IM nail traversing a distal right femur fracture, in near-anatomic alignment and position. No definite complicating features. Electronically Signed   By: Margarette Canada M.D.   On: 05/19/2015 12:44     CBC  Recent Labs Lab 05/17/15 1629 05/18/15 0426 05/19/15 0411 05/20/15 0405  WBC 9.4 8.9 9.6 14.5*  HGB 10.8* 9.0* 8.6* 9.0*  HCT 32.3* 26.6* 25.3* 25.6*  PLT 265 221 224 194  MCV 100.0 100.0 99.2 92.1  MCH 33.4 33.8 33.7 32.4  MCHC 33.4 33.8 34.0 35.2  RDW 12.8 12.9 12.8 15.9*  LYMPHSABS 1.1  --   --   --   MONOABS 0.6  --   --   --   EOSABS 0.1  --   --   --   BASOSABS 0.0  --   --   --     Chemistries   Recent Labs Lab 05/17/15 1629 05/18/15 0426 05/19/15 0411 05/20/15 0405  NA 138 141 143 142  K 5.1 4.7 4.5 4.7  CL 106 109 112* 110  CO2 27 26 25 25   GLUCOSE 124* 113* 108* 133*  BUN 25* 24* 22* 18  CREATININE 1.29* 1.07 1.11 1.08  CALCIUM 9.0 8.7* 8.7* 8.5*   ------------------------------------------------------------------------------------------------------------------ estimated creatinine clearance is 44.2 mL/min (by C-G formula based  on Cr of 1.08). ------------------------------------------------------------------------------------------------------------------ No results for input(s): HGBA1C in the last 72 hours. ------------------------------------------------------------------------------------------------------------------ No results for input(s): CHOL, HDL, LDLCALC, TRIG, CHOLHDL, LDLDIRECT in the last 72 hours. ------------------------------------------------------------------------------------------------------------------ No results for input(s): TSH, T4TOTAL, T3FREE, THYROIDAB in the last 72 hours.  Invalid input(s): FREET3 ------------------------------------------------------------------------------------------------------------------ No results for input(s): VITAMINB12, FOLATE, FERRITIN, TIBC, IRON, RETICCTPCT in the last 72 hours.  Coagulation profile  Recent Labs Lab 05/17/15 1629  INR 1.09  No results for input(s): DDIMER in the last 72 hours.  Cardiac Enzymes No results for input(s): CKMB, TROPONINI, MYOGLOBIN in the last 168 hours.  Invalid input(s): CK ------------------------------------------------------------------------------------------------------------------ Invalid input(s): POCBNP     Time Spent in minutes   25 minutes   Karrisa Didio M.D on 05/20/2015 at 3:12 PM  Between 7am to 7pm - Pager - [phone]  After 7pm go to [email or web address] - password [facility]  [facility]   Office  [phone]

## 2015-05-20 NOTE — Evaluation (Signed)
Occupational Therapy Evaluation Patient Details Name: [name] MRN: [id number] DOB: 1924/12/22 Today's Date: 05/20/2015    History of Present Illness Pt is 82 y/o male s/p fall with R distal femur fx.  Surgery performed for 05/19/15 per Dr Lillia Corporal.  PT order for L TKR 05/08/15 by Dr Gladstone Lighter.  PT with other hx of CAD, RBBB, dementia and Cabg   Clinical Impression   Pt admitted with R distal femur fracture. Pt currently with functional limitations due to the deficits listed below (see OT Problem List).  Pt will benefit from skilled OT to increase their safety and independence with ADL and functional mobility for ADL to facilitate discharge to venue listed below.    Follow Up Recommendations  SNF    Equipment Recommendations  None recommended by OT       Precautions / Restrictions Precautions Precautions: Other (comment) Precaution Comments: TDWB on RIGHT      Mobility Bed Mobility Overal bed mobility: Needs Assistance Bed Mobility: Supine to Sit     Supine to sit: Total assist;+2 for physical assistance;+2 for safety/equipment        Transfers Overall transfer level: Needs assistance Equipment used: 2 person hand held assist Transfers: Sit to/from Stand Sit to Stand: Total assist;+2 physical assistance;+2 safety/equipment         General transfer comment: did not reach full stand - even with bed raised         ADL Overall ADL's : Needs assistance/impaired                                       General ADL Comments: pt only able to sit EOB with PT and OT.  Pt needed both hands for support when sitting. RN to call MD and clarify WB.                 Pertinent Vitals/Pain Pain Assessment: 0-10 Pain Score: 5  Pain Location: RLE     Hand Dominance     Extremity/Trunk Assessment Upper Extremity Assessment Upper Extremity Assessment: Overall WFL for tasks assessed           Communication Communication Communication: No  difficulties                 Home Living Family/patient expects to be discharged to:: Skilled nursing facility Living Arrangements: Spouse/significant other Available Help at Discharge: Family Type of Home: House Home Access: Ramped entrance     Home Layout: Two level;Able to live on main level with bedroom/bathroom               Home Equipment: [name] - single point;Walker - 2 wheels          Prior Functioning/Environment Level of Independence: Independent with assistive device(s)        Comments: Pt on RW post op from L TKR.  Pt no longer using KI    OT Diagnosis: Generalized weakness;Acute pain   OT Problem List: Decreased activity tolerance;Pain;Decreased strength;Decreased knowledge of use of DME or AE;Decreased knowledge of precautions   OT Treatment/Interventions: Self-care/ADL training;DME and/or AE instruction;Patient/family education    OT Goals(Current goals can be found in the care plan section) Acute Rehab OT Goals Patient Stated Goal: Regain IND Time For Goal Achievement: 06/03/15 Potential to Achieve Goals: Good  OT Frequency: Min 2X/week              End of  Session Nurse Communication: Mobility status  Activity Tolerance: Patient limited by pain Patient left: in bed;with call bell/phone within reach;with family/visitor present   Time: 1318-1401 OT Time Calculation (min): 43 min Charges:  OT General Charges $OT Visit: 1 Procedure OT Evaluation $OT Eval Low Complexity: 1 Procedure OT Treatments $Self Care/Home Management : 8-22 mins G-Codes:    Payton Mccallum D Jun 10, 2015, 2:12 PM

## 2015-05-20 NOTE — Progress Notes (Signed)
Patient's son was confused about patient's weight-bearing status, stating that one of the doctors stressed NWB. RN paged Dr. Lyla Glassing to clarify this order. Per Dr. Lyla Glassing, patient is TDWB to equal 30%. Order correct in Epic.

## 2015-05-20 NOTE — Progress Notes (Signed)
   Subjective:  Patient reports pain as mild to moderate.  No c/o.  Objective:   VITALS:   Filed Vitals:   05/19/15 1650 05/19/15 1841 05/19/15 2016 05/20/15 0205  BP: 167/65 137/64 133/64 138/61  Pulse: 86 77 63 74  Temp: 97.5 F (36.4 C) 98.4 F (36.9 C) 97.8 F (36.6 C) 98.2 F (36.8 C)  TempSrc: Oral Oral Oral Oral  Resp: 18 17 16 16   Height:      Weight:      SpO2: 100% 97% 99% 100%    ABD soft Sensation intact distally Intact pulses distally Dorsiflexion/Plantar flexion intact Incision: dressing C/D/I Compartment soft   Lab Results  Component Value Date   WBC 14.5* 05/20/2015   HGB 9.0* 05/20/2015   HCT 25.6* 05/20/2015   MCV 92.1 05/20/2015   PLT 194 05/20/2015   BMET    Component Value Date/Time   NA 142 05/20/2015 0405   K 4.7 05/20/2015 0405   CL 110 05/20/2015 0405   CO2 25 05/20/2015 0405   GLUCOSE 133* 05/20/2015 0405   BUN 18 05/20/2015 0405   CREATININE 1.08 05/20/2015 0405   CREATININE 1.16* 12/21/2014 1119   CALCIUM 8.5* 05/20/2015 0405   GFRNONAA 59* 05/20/2015 0405   GFRAA >60 05/20/2015 0405     Assessment/Plan: 1 Day Post-Op   Principal Problem:   Fracture of femoral shaft, right, closed ([place]) Active Problems:   CAD (coronary artery disease)s/p CABG (LIMA to LAD, SVG sequential to first and second OM, SVG to distal RCA) 2009   Second degree AV block, Mobitz type I   Dyslipidemia   HTN (hypertension)   Femur fracture, right (HCC)   Hip abductor tendinitis   Fracture, femur, supracondylar ([place])   TDWB RLE with walker DVT ppx: lovenox x30 days, SCDs, TEDs PO pain control PT/OT: gait and traansfer training, ROM R knee Bowel regimen D/C planning   Audine Mangione, Horald Pollen 05/20/2015, 6:27 AM   Rod Can, MD Cell [phone]

## 2015-05-20 NOTE — Evaluation (Signed)
Physical Therapy Evaluation Patient Details Name: [name] MRN: [id number] DOB: Mar 02, 1924 Today's Date: 05/20/2015   History of Present Illness  Pt is 82 y/o male s/p fall with R distal femur fx. S/P  Open reduction, intramedullary fixation of right supracondylar periprosthetic distal femur fracture perfomed for 05/19/15 for IM nailing . PT order for L TKR 05/08/15 by Dr Gladstone Lighter.  PT with other hx of CAD, RBBB, dementia and Cabg  Clinical Impression  The patient tolerated the bed mobility with 2 assist, support of R leg through out. Per Dr. Lyla Glassing via RN, patient is TDWB. Pt admitted with above diagnosis. Pt currently with functional limitations due to the deficits listed below (see PT Problem List).  Pt will benefit from skilled PT to increase their independence and safety with mobility to allow discharge to the venue listed below.       Follow Up Recommendations SNF;Supervision/Assistance - 24 hour    Equipment Recommendations  None recommended by PT    Recommendations for Other Services       Precautions / Restrictions Precautions Precautions: Other (comment) Precaution Comments: TDWB on RIGHT Restrictions RLE Weight Bearing: Touchdown weight bearing      Mobility  Bed Mobility Overal bed mobility: Needs Assistance Bed Mobility: Supine to Sit     Supine to sit: Total assist;+2 for physical assistance;+2 for safety/equipment        Transfers Overall transfer level: Needs assistance Equipment used: 2 person hand held assist Transfers: Sit to/from Stand Sit to Stand: Total assist;+2 physical assistance;+2 safety/equipment         General transfer comment: did not reach full stand - even with bed raised, risk for weight on the R leg.  Ambulation/Gait                Stairs            Wheelchair Mobility    Modified Rankin (Stroke Patients Only)       Balance                                             Pertinent  Vitals/Pain Pain Assessment: 0-10 Pain Score: 5  Faces Pain Scale: Hurts whole lot Pain Location: R leg Pain Intervention(s): Limited activity within patient's tolerance;Patient requesting pain meds-RN notified;Ice applied    Home Living Family/patient expects to be discharged to:: Skilled nursing facility Living Arrangements: Spouse/significant other Available Help at Discharge: Family Type of Home: House Home Access: Ramped entrance     Home Layout: Two level;Able to live on main level with bedroom/bathroom Home Equipment: [name] - single point;Walker - 2 wheels      Prior Function Level of Independence: Independent with assistive device(s)         Comments: Pt on RW post op from L TKR.  Pt no longer using KI     Hand Dominance        Extremity/Trunk Assessment   Upper Extremity Assessment: Defer to OT evaluation           Lower Extremity Assessment: RLE deficits/detail RLE Deficits / Details: tolerated  50 degrees of knee flexion in sitting. assist to lift the leg , slide across the bed. LLE Deficits / Details: 3+/5 quad, SLR with a lag, -8-85 flexion     Communication   Communication: No difficulties  Cognition Arousal/Alertness: Awake/alert  General Comments      Exercises        Assessment/Plan    PT Assessment Patient needs continued PT services  PT Diagnosis Difficulty walking   PT Problem List Decreased strength;Decreased range of motion;Decreased activity tolerance;Decreased mobility;Decreased knowledge of precautions;Decreased safety awareness;Decreased knowledge of use of DME;Pain  PT Treatment Interventions DME instruction;Gait training;Functional mobility training;Therapeutic activities;Therapeutic exercise;Patient/family education   PT Goals (Current goals can be found in the Care Plan section) Acute Rehab PT Goals Patient Stated Goal: to walk again PT Goal Formulation: With patient/family Time For Goal  Achievement: 06/03/15 Potential to Achieve Goals: Fair    Frequency Min 5X/week   Barriers to discharge Decreased caregiver support      Co-evaluation               End of Session Equipment Utilized During Treatment: Gait belt Activity Tolerance: Patient tolerated treatment well Patient left: in bed;with call bell/phone within reach;with bed alarm set;with family/visitor present Nurse Communication: Mobility status         Time: 1318-1401 PT Time Calculation (min) (ACUTE ONLY): 43 min   Charges:   PT Evaluation $PT Eval Moderate Complexity: 1 Procedure     PT G CodesClaretha Cooper 05/20/2015, 2:41 PM Tresa Endo PT [phone]

## 2015-05-21 DIAGNOSIS — D72829 Elevated white blood cell count, unspecified: Secondary | ICD-10-CM | POA: Diagnosis not present

## 2015-05-21 DIAGNOSIS — S72301D Unspecified fracture of shaft of right femur, subsequent encounter for closed fracture with routine healing: Secondary | ICD-10-CM | POA: Diagnosis not present

## 2015-05-21 DIAGNOSIS — D62 Acute posthemorrhagic anemia: Secondary | ICD-10-CM | POA: Diagnosis not present

## 2015-05-21 DIAGNOSIS — M6281 Muscle weakness (generalized): Secondary | ICD-10-CM | POA: Diagnosis not present

## 2015-05-21 DIAGNOSIS — R2681 Unsteadiness on feet: Secondary | ICD-10-CM | POA: Diagnosis not present

## 2015-05-21 DIAGNOSIS — E785 Hyperlipidemia, unspecified: Secondary | ICD-10-CM | POA: Diagnosis not present

## 2015-05-21 DIAGNOSIS — S72391D Other fracture of shaft of right femur, subsequent encounter for closed fracture with routine healing: Secondary | ICD-10-CM | POA: Diagnosis not present

## 2015-05-21 DIAGNOSIS — Z96651 Presence of right artificial knee joint: Secondary | ICD-10-CM | POA: Diagnosis not present

## 2015-05-21 DIAGNOSIS — Z4789 Encounter for other orthopedic aftercare: Secondary | ICD-10-CM | POA: Diagnosis not present

## 2015-05-21 DIAGNOSIS — Z471 Aftercare following joint replacement surgery: Secondary | ICD-10-CM | POA: Diagnosis not present

## 2015-05-21 DIAGNOSIS — R262 Difficulty in walking, not elsewhere classified: Secondary | ICD-10-CM | POA: Diagnosis not present

## 2015-05-21 DIAGNOSIS — R278 Other lack of coordination: Secondary | ICD-10-CM | POA: Diagnosis not present

## 2015-05-21 DIAGNOSIS — S728X9A Other fracture of unspecified femur, initial encounter for closed fracture: Secondary | ICD-10-CM | POA: Diagnosis not present

## 2015-05-21 DIAGNOSIS — N400 Enlarged prostate without lower urinary tract symptoms: Secondary | ICD-10-CM | POA: Diagnosis not present

## 2015-05-21 DIAGNOSIS — I2510 Atherosclerotic heart disease of native coronary artery without angina pectoris: Secondary | ICD-10-CM | POA: Diagnosis not present

## 2015-05-21 DIAGNOSIS — I10 Essential (primary) hypertension: Secondary | ICD-10-CM | POA: Diagnosis not present

## 2015-05-21 DIAGNOSIS — R4189 Other symptoms and signs involving cognitive functions and awareness: Secondary | ICD-10-CM | POA: Diagnosis not present

## 2015-05-21 DIAGNOSIS — K5901 Slow transit constipation: Secondary | ICD-10-CM | POA: Diagnosis not present

## 2015-05-21 DIAGNOSIS — Z96653 Presence of artificial knee joint, bilateral: Secondary | ICD-10-CM | POA: Diagnosis not present

## 2015-05-21 DIAGNOSIS — Z96652 Presence of left artificial knee joint: Secondary | ICD-10-CM | POA: Diagnosis not present

## 2015-05-21 DIAGNOSIS — M1712 Unilateral primary osteoarthritis, left knee: Secondary | ICD-10-CM | POA: Diagnosis not present

## 2015-05-21 DIAGNOSIS — Z9181 History of falling: Secondary | ICD-10-CM | POA: Diagnosis not present

## 2015-05-21 LAB — CBC
HCT: 27.7 % — ABNORMAL LOW (ref 39.0–52.0)
HEMOGLOBIN: 9.2 g/dL — AB (ref 13.0–17.0)
MCH: 31.6 pg (ref 26.0–34.0)
MCHC: 33.2 g/dL (ref 30.0–36.0)
MCV: 95.2 fL (ref 78.0–100.0)
PLATELETS: 230 10*3/uL (ref 150–400)
RBC: 2.91 MIL/uL — AB (ref 4.22–5.81)
RDW: 16 % — ABNORMAL HIGH (ref 11.5–15.5)
WBC: 12.1 10*3/uL — ABNORMAL HIGH (ref 4.0–10.5)

## 2015-05-21 LAB — CBC AND DIFFERENTIAL: WBC: 12.1 10*3/mL

## 2015-05-21 LAB — BASIC METABOLIC PANEL
Anion gap: 7 (ref 5–15)
BUN: 21 mg/dL (ref 4–21)
BUN: 21 mg/dL — AB (ref 6–20)
CALCIUM: 8.4 mg/dL — AB (ref 8.9–10.3)
CO2: 26 mmol/L (ref 22–32)
CREATININE: 1 mg/dL (ref 0.6–1.3)
CREATININE: 0.99 mg/dL (ref 0.61–1.24)
Chloride: 108 mmol/L (ref 101–111)
GFR calc Af Amer: >60 mL/min (ref >60)
Glucose, Bld: 105 mg/dL — ABNORMAL HIGH (ref 65–99)
Glucose: 105 mg/dL
Potassium: 4.6 mmol/L (ref 3.5–5.1)
SODIUM: 141 mmol/L (ref 135–145)
Sodium: 141 mmol/L (ref 137–147)

## 2015-05-21 MED ORDER — MAGNESIUM CITRATE PO SOLN: 1.0000 | Freq: Once | ORAL | Status: DC

## 2015-05-21 MED ORDER — ACETAMINOPHEN 325 MG PO TABS: 650.0000 mg | ORAL_TABLET | Freq: Four times a day (QID) | ORAL | Status: AC | PRN

## 2015-05-21 MED ORDER — DOCUSATE SODIUM 100 MG PO CAPS
100.0000 mg | ORAL_CAPSULE | Freq: Two times a day (BID) | ORAL | Status: DC
Start: 2015-05-21 — End: 2015-06-10

## 2015-05-21 MED ORDER — FLEET ENEMA 7-19 GM/118ML RE ENEM: 1.0000 | ENEMA | Freq: Every day | RECTAL | Status: DC | PRN

## 2015-05-21 MED ORDER — FLEET ENEMA 7-19 GM/118ML RE ENEM: 1.0000 | ENEMA | Freq: Once | RECTAL | Status: DC

## 2015-05-21 MED ORDER — ASPIRIN 81 MG PO TBEC
81.0000 mg | DELAYED_RELEASE_TABLET | Freq: Every day | ORAL | Status: DC
Start: 2015-05-21 — End: 2015-06-18

## 2015-05-21 MED ORDER — BISACODYL 10 MG RE SUPP: 10.0000 mg | Freq: Every day | RECTAL | Status: DC | PRN

## 2015-05-21 MED ORDER — SENNOSIDES-DOCUSATE SODIUM 8.6-50 MG PO TABS: 2.0000 | ORAL_TABLET | Freq: Two times a day (BID) | ORAL | Status: DC

## 2015-05-21 NOTE — Progress Notes (Signed)
Physical Therapy Treatment Patient Details Name: [name] MRN: [id number] DOB: [birth year] Today's Date: 05/21/2015    History of Present Illness Pt is 82 y/o male s/p fall with R distal femur fx.  Surgery performed for 05/19/15.  PT order for L TKR 05/08/15 by Dr Gladstone Lighter.  PT with other hx of CAD, RBBB, dementia and Cabg    PT Comments    The patient is tolerating more ROM and activity. L leg remains too weak to soley stand. To SNF.  Follow Up Recommendations  SNF;Supervision/Assistance - 24 hour     Equipment Recommendations  None recommended by PT    Recommendations for Other Services       Precautions / Restrictions Precautions Precautions: Other (comment);Fall Precaution Comments: TDWB on RIGHT Restrictions RLE Weight Bearing: Touchdown weight bearing RLE Partial Weight Bearing Percentage or Pounds: 30    Mobility  Bed Mobility   Bed Mobility: Supine to Sit     Supine to sit: Total assist;+2 for physical assistance;+2 for safety/equipment     General bed mobility comments: assist with thr R leg  Transfers Overall transfer level: Needs assistance                  Ambulation/Gait                 Stairs            Wheelchair Mobility    Modified Rankin (Stroke Patients Only)       Balance                                    Cognition Arousal/Alertness: Awake/alert                          Exercises Total Joint Exercises Ankle Circles/Pumps: AROM;Left;15 reps;Supine Quad Sets: AROM;Left;15 reps;Supine Short Arc Quad: AAROM;Left;10 reps Heel Slides: AAROM;Left;20 reps;Supine Hip ABduction/ADduction: AAROM;Left;10 reps Straight Leg Raises: Left;AAROM;AROM;15 reps Long Arc Quad: AROM;Left;10 reps;Seated    General Comments        Pertinent Vitals/Pain Pain Score: 6  Pain Location: R thigh Pain Descriptors / Indicators: Aching;Discomfort;Grimacing Pain Intervention(s): Limited activity  within patient's tolerance;Monitored during session;Patient requesting pain meds-RN notified;Ice applied    Home Living                      Prior Function            PT Goals (current goals can now be found in the care plan section) Progress towards PT goals: Progressing toward goals    Frequency  Min 5X/week    PT Plan Current plan remains appropriate    Co-evaluation             End of Session   Activity Tolerance: Patient tolerated treatment well Patient left: in bed     Time: 1423-1500 PT Time Calculation (min) (ACUTE ONLY): 37 min  Charges:  $Therapeutic Exercise: 23-37 mins $Therapeutic Activity: 8-22 mins                    G Codes:      Claretha Cooper 05/21/2015, 6:53 PM

## 2015-05-21 NOTE — Discharge Summary (Signed)
[name], is a 82 y.o. male  DOB Jul 09, 1924  MRN [id number].  Admission date:  05/17/2015  Admitting Physician  Albertine Patricia, MD  Discharge Date:  05/21/2015   Primary MD  Leola Brazil, MD  Recommendations for primary care physician for things to follow:  - These follow with orthopedic as an outpatient - Please check CBC, BMP in 3 days   Admission Diagnosis  Pain [R52]   Discharge Diagnosis  Pain [R52]   Principal Problem:   Fracture of femoral shaft, right, closed ([place]) Active Problems:   CAD (coronary artery disease)s/p CABG (LIMA to LAD, SVG sequential to first and second OM, SVG to distal RCA) 2009   Second degree AV block, Mobitz type I   Dyslipidemia   HTN (hypertension)   Femur fracture, right (HCC)   Hip abductor tendinitis   Fracture, femur, supracondylar ([place])      Past Medical History  Diagnosis Date  . CAD (coronary artery disease)   . Essential and other specified forms of tremor 07/15/2012  . RBBB   . Mobitz type 2 second degree atrioventricular block   . HTN (hypertension) 01/30/2013  . Urinary frequency   . Cancer ([place])     hx of bladder cancer- 2002   . Dementia due to general medical condition without behavioral disturbance     unspecified (per wife) dx at [facility]    Past Surgical History  Procedure Laterality Date  . Knee surgery Right 2006  . Coronary artery bypass graft  05/10/2007    LIMA to LAD, SVG to 1st and 2nd OM and SVG to RCA  . Cardiac catheterization  04/27/2007    3 vessel disease  . Coronary angioplasty  1988  . Coronary angioplasty with stent placement  1998    LAD  . US echocardiography  10/01/2010    mild concentric LVH,LA mildly dilated,RA mild to mod. dilated,mild MR,mild to mod. TR,mild AI,mild PI,sinus rhythm w/pauses  . Nm myocar perf wall motion  04/20/2007    mod. reversible ischemia  . Total knee arthroplasty Left  05/08/2015    Procedure: LEFT TOTAL KNEE ARTHROPLASTY;  Surgeon: Latanya Maudlin, MD;  Location: [facility];  Service: Orthopedics;  Laterality: Left;  . Femur im nail Right 05/19/2015    Procedure: INTRAMEDULLARY (IM) RETROGRADE FEMORAL NAILING with ZIMMER CABLES;  Surgeon: Rod Can, MD;  Location: [facility];  Service: Orthopedics;  Laterality: Right;       History of present illness and  Hospital Course:     Kindly see H&P for history of present illness and admission details, please review complete Labs, Consult reports and Test reports for all details in brief  HPI  from the history and physical done on the day of admission 05/17/2015  [name] is a 82 y.o. male, past medical history of coronary artery disease status post CABG, hypertension, second-degree heart block Mobitz 1, and hyperlipidemia presents with full, patient status post total knee replacement last week, presents with mechanical fall [name], denies syncope,  lightheadedness, dizziness, chest pain palpitation or shortness of breath, trying to sit down on a stool, he missed the stool and that did on his right side, he felt severe right knee pain, workup significant for right distal femoral shaft periprosthetic fracture, patient seen by Dr. Tonita Cong from orthopedic surgery, recommendation to admit as he will need ORIF of right distal femur, blood work with no significant abnormalities.  Hospital Course   Right distal femoral shaft fracture - Status post mechanical fall , Orthopedic consult greatly appreciated, Went for surgical repair 4/2 by Dr. Lyla Glassing - PT recommended SNF placement, - Subcutaneous Lovenox for DVT prophylaxis total of 30 days   History of coronary artery disease - Denies any chest pain or shortness of breath, will resume on aspirin  Hypertension - Resume home medication on discharge  Dyslipidemia - Continue statin   Discharge Condition:  stable   Follow UP  Follow-up Information    Follow up  with Swinteck, Horald Pollen, MD. Schedule an appointment as soon as possible for a visit in 2 weeks.   Specialty:  Orthopedic Surgery   Why:  For suture removal   Contact information:   [address] [address] [phone]       Follow up with [facility].   Specialty:  [place] information:   [address] [place] [phone]      Follow up with Leola Brazil, MD.   Specialty:  Pulmonary Disease   Why:  After discharge from SNF   Contact information:   [place] [address] [phone]         Discharge Instructions  and  Discharge Medications     Discharge Instructions    Increase activity slowly    Complete by:  As directed             Medication List    STOP taking these medications        methocarbamol 500 MG tablet  Commonly known as:  ROBAXIN      TAKE these medications        acetaminophen 325 MG tablet  Commonly known as:  TYLENOL  Take 2 tablets (650 mg total) by mouth every 6 (six) hours as needed for mild pain (or Fever >/= 101).     amLODipine 2.5 MG tablet  Commonly known as:  NORVASC  Take 2.5 mg by mouth daily.     aspirin 81 MG EC tablet  Take 1 tablet (81 mg total) by mouth daily.     atorvastatin 20 MG tablet  Commonly known as:  LIPITOR  Take 20 mg by mouth at bedtime.     bisacodyl 10 MG suppository  Commonly known as:  DULCOLAX  Place 1 suppository (10 mg total) rectally daily as needed for moderate constipation.     docusate sodium 100 MG capsule  Commonly known as:  COLACE  Take 1 capsule (100 mg total) by mouth 2 (two) times daily.     donepezil 5 MG tablet  Commonly known as:  ARICEPT  Take 5 mg by mouth at bedtime.     enoxaparin 40 MG/0.4ML injection  Commonly known as:  LOVENOX  Inject 0.4 mLs (40 mg total) into the skin daily.     hydrochlorothiazide 12.5 MG capsule  Commonly known  as:  MICROZIDE  take 1 capsule by mouth once daily     HYDROcodone-acetaminophen 5-325 MG tablet  Commonly known as:  NORCO/VICODIN  Take 1-2 tablets by mouth every 4 (four) hours as needed for moderate pain.     senna-docusate 8.6-50 MG tablet  Commonly known as:  Senokot-S  Take 2 tablets by mouth 2 (two) times daily.     silodosin 8 MG Caps capsule  Commonly known as:  RAPAFLO  Take 8 mg by mouth at bedtime.     sodium phosphate 7-19 GM/118ML Enem  Place 133 mLs (1 enema total) rectally daily as needed for severe constipation.          Diet and Activity recommendation: See Discharge Instructions above   Consults obtained -  ortho   Major procedures and Radiology Reports - PLEASE review detailed and final reports for all details, in brief -   INTRAMEDULLARY (IM) RETROGRADE FEMORAL NAILING with ZIMMER CABLES (Right) by Dr Lyla Glassing on 4/2   Dg Chest 2 View  05/01/2015  CLINICAL DATA:  Preoperative examination. Patient for knee replacement. Initial encounter. EXAM: CHEST  2 VIEW COMPARISON:  PA and lateral chest 06/02/2007. FINDINGS: The lungs are clear. The patient is status post CABG. Eventration of the right hemidiaphragm is unchanged. Heart size is upper normal. No pneumothorax or pleural effusion. IMPRESSION: No acute disease. Electronically Signed   By: Inge Rise M.D.   On: 05/01/2015 14:24   Dg Chest Port 1 View  05/17/2015  CLINICAL DATA:  Preoperative for ORIF right femur fracture EXAM: PORTABLE CHEST 1 VIEW COMPARISON:  05/01/2015 chest radiograph. FINDINGS: Sternotomy wires appear aligned and intact. CABG clips overlie the mediastinum. Stable cardiomediastinal silhouette with top-normal heart size. No pneumothorax. No pleural effusion. Lungs appear clear, with no acute consolidative airspace disease and no pulmonary edema. IMPRESSION: No active disease. Electronically Signed   By: Ilona Sorrel M.D.   On: 05/17/2015 16:34   Dg C-arm 61-120 Min-no  Report  05/19/2015  CLINICAL DATA: surgery C-ARM 61-120 MINUTES Fluoroscopy was utilized by the requesting physician.  No radiographic interpretation.   Dg Femur, Min 2 Views Right  05/19/2015  CLINICAL DATA:  ORIF right femur fracture. EXAM: DG C-ARM 61-120 MIN-NO REPORT; RIGHT FEMUR 2 VIEWS COMPARISON:  05/17/2015 FINDINGS: Four intraoperative spot views of the right femur are submitted postoperatively for interpretation. An intramedullary nail within the right femur is identified traversing a oblique fracture of the distal right femur, in near-anatomic alignment position. No gross complicating features noted. IMPRESSION: Placement of right femoral intramedullary nail traversing a distal right femur fracture, in near-anatomic alignment and position. Electronically Signed   By: Margarette Canada M.D.   On: 05/19/2015 10:27   Dg Femur, Min 2 Views Right  05/17/2015  CLINICAL DATA:  82 year old male who fell at home. Pain. Initial encounter. EXAM: RIGHT FEMUR 2 VIEWS COMPARISON:  None. FINDINGS: Right femoral head normally located. Proximal right femur appears intact. Visible right hemipelvis grossly intact. Previous right total knee arthroplasty. Comminuted spiral fracture extending from about 14 cm proximal to the knee to the anterior superior margin of the femoral hardware (arrow image 4). Anterior displacement 1 full shaft width. Moderate anterior angulation. Over riding of fracture fragments up to 4 cm. Mild medial displacement and lateral angulation. Anterior displacement of a large butterfly fragment. Incidental external zipper artifact along the lateral knee. The knee arthroplasty components remain normally aligned. Dystrophic calcification about the patella. There does appear to be hemorrhage within the joint. Grossly intact proximal tibia and fibula. IMPRESSION: 1. Long segment comminuted spiral fracture involving the distal third right femoral shaft  and extending to the proximal margin of the right knee  femoral implant. Large butterfly fragment. 2. One full shaft wdith anterior displacement with 4 cm of overriding, and anterior as well as medial angulation. 3. Suggestion of right knee hemarthrosis, but the knee hardware appears normally aligned. 4. Proximal right femur intact. Electronically Signed   By: Genevie Ann M.D.   On: 05/17/2015 16:11   Dg Femur Port, Min 2 Views Right  05/19/2015  CLINICAL DATA:  Internal fixation of right femur fracture. EXAM: RIGHT FEMUR PORTABLE 1 VIEW COMPARISON:  Prior studies FINDINGS: An intra medullary nail is identified within the right femur, traversing a distal femoral fracture, in near-anatomic alignment and position. Right total knee arthroplasty changes are present. No definite complicating hardware features noted. IMPRESSION: IM nail traversing a distal right femur fracture, in near-anatomic alignment and position. No definite complicating features. Electronically Signed   By: Margarette Canada M.D.   On: 05/19/2015 12:44    Micro Results    Recent Results (from the past 240 hour(s))  Surgical pcr screen     Status: None   Collection Time: 05/18/15  4:40 PM  Result Value Ref Range Status   MRSA, PCR NEGATIVE NEGATIVE Final   Staphylococcus aureus NEGATIVE NEGATIVE Final    Comment:        The Xpert SA Assay (FDA approved for NASAL specimens in patients over 86 years of age), is one component of a comprehensive surveillance program.  Test performance has been validated by [facility] for patients greater than or equal to 24 year old. It is not intended to diagnose infection nor to guide or monitor treatment.        [name]   Subjective:   [name] has no headache,no chest abdominal pain,no new weakness tingling or numbness, feels much better  [name].   Objective:   Blood pressure 140/64, pulse 80, temperature 98.5 F (36.9 C), temperature source Oral, resp. rate 16, height 5\' 7"  (1.702 m), weight 78.926 kg (174 lb), SpO2 98  %.   Intake/Output Summary (Last 24 hours) at 05/21/15 1108 Last data filed at 05/21/15 1040  Gross per 24 hour  Intake    870 ml  Output   1825 ml  Net   -955 ml    Exam Awake Alert, pleasant [place].AT,PERRAL Supple Neck,No JVD, No cervical lymphadenopathy appriciated.  Symmetrical Chest wall movement, Good air movement bilaterally, CTAB RRR,No Gallops,Rubs or new Murmurs, No Parasternal Heave +ve B.Sounds, Abd Soft, No tenderness, No rebound - guarding or rigidity. No Cyanosis, Distal pulses felt bilaterally, left anterior knee area with dressing, right lower extremity with Ace wrap around the area.  Data Review   CBC w Diff: Lab Results  Component Value Date   WBC 12.1* 05/21/2015   HGB 9.2* 05/21/2015   HCT 27.7* 05/21/2015   PLT 230 05/21/2015   LYMPHOPCT 12 05/17/2015   MONOPCT 7 05/17/2015   EOSPCT 1 05/17/2015   BASOPCT 0 05/17/2015    CMP: Lab Results  Component Value Date   NA 141 05/21/2015   K 4.6 05/21/2015   CL 108 05/21/2015   CO2 26 05/21/2015   BUN 21* 05/21/2015   CREATININE 0.99 05/21/2015   CREATININE 1.16* 12/21/2014   PROT 7.5 05/01/2015   ALBUMIN 4.2 05/01/2015   BILITOT 0.9 05/01/2015   ALKPHOS 51 05/01/2015   AST 25 05/01/2015   ALT 13* 05/01/2015  .   Total Time in preparing paper work, data evaluation and todays  exam - 35 minutes  [name] M.D on 05/21/2015 at 11:08 AM  [facility]   Office  [phone]

## 2015-05-21 NOTE — Progress Notes (Signed)
CM met with pt and pt's wife in room as CM received consult to explain DME to family.  Family concerned pt won't get necessary equipment for home as pt is going to [facility] for rehab.  CM explained while the pt is in [facility] all DME needed will be provided and at the time pt discharges from [facility] recommendations will be made for DME for home.  No other CM needs were communicated.

## 2015-05-21 NOTE — Progress Notes (Signed)
   Subjective:  Patient reports pain as mild to moderate.  C/o constipation - no n/v.  Objective:   VITALS:   Filed Vitals:   05/20/15 0937 05/20/15 1407 05/20/15 2126 05/21/15 0503  BP: 126/63 133/51 137/52 142/84  Pulse: 65 77 78 80  Temp: 98.3 F (36.8 C) 97.7 F (36.5 C) 97.8 F (36.6 C) 98.5 F (36.9 C)  TempSrc: Oral Oral Oral Oral  Resp: 16 16 16 16   Height:      Weight:      SpO2: 100% 97% 98% 98%    ABD soft Sensation intact distally Intact pulses distally Dorsiflexion/Plantar flexion intact Incision: dressing C/D/I Compartment soft   Lab Results  Component Value Date   WBC 12.1* 05/21/2015   HGB 9.2* 05/21/2015   HCT 27.7* 05/21/2015   MCV 95.2 05/21/2015   PLT 230 05/21/2015   BMET    Component Value Date/Time   NA 141 05/21/2015 0404   K 4.6 05/21/2015 0404   CL 108 05/21/2015 0404   CO2 26 05/21/2015 0404   GLUCOSE 105* 05/21/2015 0404   BUN 21* 05/21/2015 0404   CREATININE 0.99 05/21/2015 0404   CREATININE 1.16* 12/21/2014 1119   CALCIUM 8.4* 05/21/2015 0404   GFRNONAA >60 05/21/2015 0404   GFRAA >60 05/21/2015 0404     Assessment/Plan: 2 Days Post-Op   Principal Problem:   Fracture of femoral shaft, right, closed ([place]) Active Problems:   CAD (coronary artery disease)s/p CABG (LIMA to LAD, SVG sequential to first and second OM, SVG to distal RCA) 2009   Second degree AV block, Mobitz type I   Dyslipidemia   HTN (hypertension)   Femur fracture, right (HCC)   Hip abductor tendinitis   Fracture, femur, supracondylar ([place])   TDWB RLE with walker DVT ppx: lovenox x30 days, SCDs, TEDs PO pain control PT/OT: gait and transfer training, ROM R knee Bowel regimen: add suppository, mag citrate, fleet's enema as needed D/C planning   Coady Train, Horald Pollen 05/21/2015, 7:49 AM   Rod Can, MD Cell [phone]

## 2015-05-21 NOTE — Care Management Important Message (Signed)
Important Message  Patient Details  Name: [name] MRN: [id number] Date of Birth: 1924-03-15   Medicare Important Message Given:  Yes    Camillo Flaming 05/21/2015, 10:38 AMImportant Message  Patient Details  Name: [name] MRN: [id number] Date of Birth: [birth year]   Medicare Important Message Given:  Yes    Camillo Flaming 05/21/2015, 10:37 AM

## 2015-05-21 NOTE — Clinical Social Work Placement (Signed)
   CLINICAL SOCIAL WORK PLACEMENT  NOTE  Date:  05/21/2015  Patient Details  Name: [name] MRN: [id number] Date of Birth: 06-21-1924  Clinical Social Work is seeking post-discharge placement for this patient at the [place] level of care (*CSW will initial, date and re-position this form in  chart as items are completed):  Yes   Patient/family provided with [facility] Work Department's list of facilities offering this level of care within the geographic area requested by the patient (or if unable, by the patient's family).  Yes   Patient/family informed of their freedom to choose among providers that offer the needed level of care, that participate in Medicare, Medicaid or managed care program needed by the patient, have an available bed and are willing to accept the patient.  Yes   Patient/family informed of [facility]'s ownership interest in [facility] and [facility], as well as of the fact that they are under no obligation to receive care at these facilities.  PASRR submitted to EDS on 05/19/15     PASRR number received on 05/19/15     Existing PASRR number confirmed on       FL2 transmitted to all facilities in geographic area requested by pt/family on 05/19/15     FL2 transmitted to all facilities within larger geographic area on       Patient informed that his/her managed care company has contracts with or will negotiate with certain facilities, including the following:        Yes   Patient/family informed of bed offers received.  Patient chooses bed at [facility]     Physician recommends and patient chooses bed at [facility]    Patient to be transferred to [facility] on 05/21/15.  Patient to be transferred to facility by [facility]     Patient family notified on 05/21/15 of transfer.  Name of family member notified:  SON     PHYSICIAN Please sign FL2     Additional Comment: Pt / family are in agreement with d/c  to [facility] today. [facility] transport required. Son is aware that out of pocket costs may be associated with [facility] transport. D/C Summary sent to SNF for review. Scripts included in d/c packet. # for report provided to nsg.   _______________________________________________ Luretha Rued, [place]  [phone] 05/21/2015, 2:56 PM

## 2015-05-23 ENCOUNTER — Non-Acute Institutional Stay (SKILLED_NURSING_FACILITY): Payer: Medicare Other | Admitting: Internal Medicine

## 2015-05-23 ENCOUNTER — Encounter: Payer: Self-pay | Admitting: Internal Medicine

## 2015-05-23 DIAGNOSIS — S72301D Unspecified fracture of shaft of right femur, subsequent encounter for closed fracture with routine healing: Secondary | ICD-10-CM | POA: Diagnosis not present

## 2015-05-23 DIAGNOSIS — I10 Essential (primary) hypertension: Secondary | ICD-10-CM

## 2015-05-23 DIAGNOSIS — D72829 Elevated white blood cell count, unspecified: Secondary | ICD-10-CM | POA: Diagnosis not present

## 2015-05-23 DIAGNOSIS — I2583 Coronary atherosclerosis due to lipid rich plaque: Secondary | ICD-10-CM

## 2015-05-23 DIAGNOSIS — I2510 Atherosclerotic heart disease of native coronary artery without angina pectoris: Secondary | ICD-10-CM

## 2015-05-23 DIAGNOSIS — D62 Acute posthemorrhagic anemia: Secondary | ICD-10-CM

## 2015-05-23 DIAGNOSIS — N400 Enlarged prostate without lower urinary tract symptoms: Secondary | ICD-10-CM

## 2015-05-23 DIAGNOSIS — R2681 Unsteadiness on feet: Secondary | ICD-10-CM | POA: Diagnosis not present

## 2015-05-23 DIAGNOSIS — K5901 Slow transit constipation: Secondary | ICD-10-CM

## 2015-05-23 DIAGNOSIS — R4189 Other symptoms and signs involving cognitive functions and awareness: Secondary | ICD-10-CM | POA: Diagnosis not present

## 2015-05-23 NOTE — Progress Notes (Signed)
This encounter was created in error - please disregard.

## 2015-05-23 NOTE — Progress Notes (Signed)
LOCATION: [facility]  PCP: Leola Brazil, MD   Code Status: Full Code   Goals of care: Advanced Directive information Advanced Directives 05/18/2015  Does patient have an advance directive? No  Would patient like information on creating an advanced directive? No - patient declined information       Extended Emergency Contact Information Primary Emergency Contact: Noack,Mattie Address: [place] [address] [place] Phone: [phone] Mobile Phone: [phone] Relation: Spouse Secondary Emergency Contact: Monna Fam [place] Mobile Phone: [phone] Relation: Son   No Known Allergies  Chief Complaint  Patient presents with  . New Admit To SNF    New Admission     HPI:  Patient is a 82 y.o. male seen today for short term rehabilitation post hospital admission from 05/17/15- 05/21/15 post fall with right femoral shaft fracture. He underwent surgical repair on 05/19/15. He is seen in his room today. His son is present at bedside. His pain is under control with current pain medication.   Review of Systems:  Constitutional: Negative for fever, chills, diaphoresis. Energy is slowly returning. HENT: Negative for headache, congestion, sore throat, difficulty swallowing.   Eyes: Negative for blurred vision, double vision and discharge.  Respiratory: Negative for cough, shortness of breath and wheezing.   Cardiovascular: Negative for chest pain, palpitations. Positive for leg swelling.  Gastrointestinal: Negative for heartburn, nausea, vomiting, abdominal pain. Last bowel movement was today.  Genitourinary: Negative for dysuria and flank pain.  Musculoskeletal: Negative for back pain, fall.  Skin: Negative for itching, rash.  Neurological: Negative for weakness and dizziness. Psychiatric/Behavioral: Negative for depression.   Past Medical History  Diagnosis Date  . CAD (coronary artery disease)    . Essential and other specified forms of tremor 07/15/2012  . RBBB   . Mobitz type 2 second degree atrioventricular block   . HTN (hypertension) 01/30/2013  . Urinary frequency   . Cancer ([place])     hx of bladder cancer- 2002   . Dementia due to general medical condition without behavioral disturbance     unspecified (per wife) dx at [facility]   Past Surgical History  Procedure Laterality Date  . Knee surgery Right 2006  . Coronary artery bypass graft  05/10/2007    LIMA to LAD, SVG to 1st and 2nd OM and SVG to RCA  . Cardiac catheterization  04/27/2007    3 vessel disease  . Coronary angioplasty  1988  . Coronary angioplasty with stent placement  1998    LAD  . US echocardiography  10/01/2010    mild concentric LVH,LA mildly dilated,RA mild to mod. dilated,mild MR,mild to mod. TR,mild AI,mild PI,sinus rhythm w/pauses  . Nm myocar perf wall motion  04/20/2007    mod. reversible ischemia  . Total knee arthroplasty Left 05/08/2015    Procedure: LEFT TOTAL KNEE ARTHROPLASTY;  Surgeon: Latanya Maudlin, MD;  Location: [facility];  Service: Orthopedics;  Laterality: Left;  . Femur im nail Right 05/19/2015    Procedure: INTRAMEDULLARY (IM) RETROGRADE FEMORAL NAILING with ZIMMER CABLES;  Surgeon: Rod Can, MD;  Location: [facility];  Service: Orthopedics;  Laterality: Right;   Social History:   reports that he has never smoked. He has never used smokeless tobacco. He reports that he does not drink alcohol or use illicit drugs.  Family History  Problem Relation Age of Onset  . Pneumonia Mother   . Cancer Father   .  Cancer Brother     Medications:   Medication List       This list is accurate as of: 05/23/15  2:14 PM.  Always use your most recent med list.               acetaminophen 325 MG tablet  Commonly known as:  TYLENOL  Take 2 tablets (650 mg total) by mouth every 6 (six) hours as needed for mild pain (or Fever >/= 101).     amLODipine 2.5 MG tablet  Commonly known as:  NORVASC    Take 2.5 mg by mouth daily.     aspirin 81 MG EC tablet  Take 1 tablet (81 mg total) by mouth daily.     atorvastatin 20 MG tablet  Commonly known as:  LIPITOR  Take 20 mg by mouth at bedtime.     bisacodyl 10 MG suppository  Commonly known as:  DULCOLAX  Place 1 suppository (10 mg total) rectally daily as needed for moderate constipation.     docusate sodium 100 MG capsule  Commonly known as:  COLACE  Take 1 capsule (100 mg total) by mouth 2 (two) times daily.     donepezil 5 MG tablet  Commonly known as:  ARICEPT  Take 5 mg by mouth at bedtime.     enoxaparin 40 MG/0.4ML injection  Commonly known as:  LOVENOX  Inject 0.4 mLs (40 mg total) into the skin daily.     hydrochlorothiazide 12.5 MG capsule  Commonly known as:  MICROZIDE  Take 12.5 mg by mouth daily.     HYDROcodone-acetaminophen 5-325 MG tablet  Commonly known as:  NORCO/VICODIN  Take 1-2 tablets by mouth every 4 (four) hours as needed for moderate pain.     senna-docusate 8.6-50 MG tablet  Commonly known as:  Senokot-S  Take 2 tablets by mouth 2 (two) times daily.     silodosin 8 MG Caps capsule  Commonly known as:  RAPAFLO  Take 8 mg by mouth at bedtime.     sodium phosphate 7-19 GM/118ML Enem  Place 133 mLs (1 enema total) rectally daily as needed for severe constipation.        Immunizations: Immunization History  Administered Date(s) Administered  . PPD Test 05/21/2015     Physical Exam: Filed Vitals:   05/23/15 1402  BP: 134/72  Pulse: 68  Temp: 98.9 F (37.2 C)  TempSrc: Oral  Resp: 20  SpO2: 97%    General- elderly male, well built, in no acute distress Head- normocephalic, atraumatic Nose- no maxillary or frontal sinus tenderness, no nasal discharge Throat- moist mucus membrane  Eyes- PERRLA, EOMI, no pallor, no icterus Neck- no cervical lymphadenopathy Cardiovascular- normal s1,s2, no murmur Respiratory- bilateral clear to auscultation, no wheeze, no rhonchi, no crackles,  no use of accessory muscles Abdomen- bowel sounds present, soft, non tender Musculoskeletal- able to move all 4 extremities, limited right leg range of motion, right leg in immobilizer Neurological- alert and oriented to person, place and time Skin- warm and dry, right thigh surgical incision with aquacel dressing Psychiatry- normal mood and affect    Labs reviewed: Basic Metabolic Panel:  Recent Labs  05/19/15 0411 05/20/15 0405 05/21/15 05/21/15 0404  NA 143 142 141 141  K 4.5 4.7  --  4.6  CL 112* 110  --  108  CO2 25 25  --  26  GLUCOSE 108* 133*  --  105*  BUN 22* 18 21 21*  CREATININE 1.11 1.08 1.0  0.99  CALCIUM 8.7* 8.5*  --  8.4*   Liver Function Tests:  Recent Labs  12/21/14 1119 05/01/15 1350  AST 16 25  ALT 9 13*  ALKPHOS 58 51  BILITOT 0.9 0.9  PROT 7.3 7.5  ALBUMIN 4.0 4.2   No results for input(s): LIPASE, AMYLASE in the last 8760 hours. No results for input(s): AMMONIA in the last 8760 hours. CBC:  Recent Labs  05/01/15 1350  05/17/15 1629  05/19/15 0411 05/20/15 0405 05/21/15 05/21/15 0404  WBC 3.8*  < > 9.4  < > 9.6 14.5* 12.1 12.1*  NEUTROABS 1.8  --  7.6  --   --   --   --   --   HGB 13.4  < > 10.8*  < > 8.6* 9.0*  --  9.2*  HCT 39.5  < > 32.3*  < > 25.3* 25.6*  --  27.7*  MCV 98.0  < > 100.0  < > 99.2 92.1  --  95.2  PLT 144*  < > 265  < > 224 194  --  230  < > = values in this interval not displayed. Cardiac Enzymes: No results for input(s): CKTOTAL, CKMB, CKMBINDEX, TROPONINI in the last 8760 hours. BNP: Invalid input(s): POCBNP CBG: No results for input(s): GLUCAP in the last 8760 hours.  Radiological Exams: Dg Chest 2 View  05/01/2015  CLINICAL DATA:  Preoperative examination. Patient for knee replacement. Initial encounter. EXAM: CHEST  2 VIEW COMPARISON:  PA and lateral chest 06/02/2007. FINDINGS: The lungs are clear. The patient is status post CABG. Eventration of the right hemidiaphragm is unchanged. Heart size is upper  normal. No pneumothorax or pleural effusion. IMPRESSION: No acute disease. Electronically Signed   By: Inge Rise M.D.   On: 05/01/2015 14:24   Dg Chest Port 1 View  05/17/2015  CLINICAL DATA:  Preoperative for ORIF right femur fracture EXAM: PORTABLE CHEST 1 VIEW COMPARISON:  05/01/2015 chest radiograph. FINDINGS: Sternotomy wires appear aligned and intact. CABG clips overlie the mediastinum. Stable cardiomediastinal silhouette with top-normal heart size. No pneumothorax. No pleural effusion. Lungs appear clear, with no acute consolidative airspace disease and no pulmonary edema. IMPRESSION: No active disease. Electronically Signed   By: Ilona Sorrel M.D.   On: 05/17/2015 16:34   Dg C-arm 61-120 Min-no Report  05/19/2015  CLINICAL DATA: surgery C-ARM 61-120 MINUTES Fluoroscopy was utilized by the requesting physician.  No radiographic interpretation.   Dg Femur, Min 2 Views Right  05/19/2015  CLINICAL DATA:  ORIF right femur fracture. EXAM: DG C-ARM 61-120 MIN-NO REPORT; RIGHT FEMUR 2 VIEWS COMPARISON:  05/17/2015 FINDINGS: Four intraoperative spot views of the right femur are submitted postoperatively for interpretation. An intramedullary nail within the right femur is identified traversing a oblique fracture of the distal right femur, in near-anatomic alignment position. No gross complicating features noted. IMPRESSION: Placement of right femoral intramedullary nail traversing a distal right femur fracture, in near-anatomic alignment and position. Electronically Signed   By: Margarette Canada M.D.   On: 05/19/2015 10:27   Dg Femur, Min 2 Views Right  05/17/2015  CLINICAL DATA:  82 year old male who fell at home. Pain. Initial encounter. EXAM: RIGHT FEMUR 2 VIEWS COMPARISON:  None. FINDINGS: Right femoral head normally located. Proximal right femur appears intact. Visible right hemipelvis grossly intact. Previous right total knee arthroplasty. Comminuted spiral fracture extending from about 14 cm  proximal to the knee to the anterior superior margin of the femoral hardware (arrow image 4). Anterior  displacement 1 full shaft width. Moderate anterior angulation. Over riding of fracture fragments up to 4 cm. Mild medial displacement and lateral angulation. Anterior displacement of a large butterfly fragment. Incidental external zipper artifact along the lateral knee. The knee arthroplasty components remain normally aligned. Dystrophic calcification about the patella. There does appear to be hemorrhage within the joint. Grossly intact proximal tibia and fibula. IMPRESSION: 1. Long segment comminuted spiral fracture involving the distal third right femoral shaft and extending to the proximal margin of the right knee femoral implant. Large butterfly fragment. 2. One full shaft wdith anterior displacement with 4 cm of overriding, and anterior as well as medial angulation. 3. Suggestion of right knee hemarthrosis, but the knee hardware appears normally aligned. 4. Proximal right femur intact. Electronically Signed   By: Genevie Ann M.D.   On: 05/17/2015 16:11   Dg Femur Port, Min 2 Views Right  05/19/2015  CLINICAL DATA:  Internal fixation of right femur fracture. EXAM: RIGHT FEMUR PORTABLE 1 VIEW COMPARISON:  Prior studies FINDINGS: An intra medullary nail is identified within the right femur, traversing a distal femoral fracture, in near-anatomic alignment and position. Right total knee arthroplasty changes are present. No definite complicating hardware features noted. IMPRESSION: IM nail traversing a distal right femur fracture, in near-anatomic alignment and position. No definite complicating features. Electronically Signed   By: Margarette Canada M.D.   On: 05/19/2015 12:44    Assessment/Plan  Unsteady gait Post right femoral fracture, s/p surgery. Will have patient work with PT/OT as tolerated to regain strength and restore function.  Fall precautions are in place  Right femoral shaft fracture S/p surgical  repair. Has f/u with orthopedics .continue norco 5-325 mg 1-2 tab q4h prn pain. Continue lovenox for dvt prophylaxis. Will have him work with physical therapy and occupational therapy team to help with gait training and muscle strengthening exercises.fall precautions. Skin care. Encourage to be out of bed.   Blood loss anemia Post op, monitor cbc  Leukocytosis Likely reactive leukocytosis, monitor cbc  HTN Stable bp, monitor bp, continue amlodipine and hctz as above. Check bmp  CAD Chest pain free. Continue atorvastatin and baby aspirin  Constipation Continue colace 100 mg bid and senokot s 2 tab bid and monitor  BPH Continue rapaflo  Cognitive impairment Continue aricept for now. Continue supportive care   Goals of care: short term rehabilitation   Labs/tests ordered: cbc, cmp 05/27/15  Family/ staff Communication: reviewed care plan with patient and nursing supervisor    Blanchie Serve, MD Internal Medicine [place], [place] [address] Cell Phone (Monday-Friday 8 am - 5 pm): [phone] On Call: [phone] and follow prompts after 5 pm and on weekends Office Phone: [phone] Office Fax: [phone]

## 2015-05-27 LAB — BASIC METABOLIC PANEL
BUN: 20 mg/dL (ref 4–21)
CREATININE: 1.2 mg/dL (ref 0.6–1.3)
GLUCOSE: 110 mg/dL
Potassium: 4.5 mmol/L (ref 3.4–5.3)
Sodium: 140 mmol/L (ref 137–147)

## 2015-05-27 LAB — CBC AND DIFFERENTIAL
HCT: 34 % — AB (ref 41–53)
Hemoglobin: 10.7 g/dL — AB (ref 13.5–17.5)
PLATELETS: 323 10*3/uL (ref 150–399)
WBC: 12.6 10*3/mL

## 2015-05-27 LAB — HEPATIC FUNCTION PANEL
ALK PHOS: 81 U/L (ref 25–125)
ALT: 19 U/L (ref 10–40)
AST: 23 U/L (ref 14–40)
BILIRUBIN, TOTAL: 1.1 mg/dL

## 2015-05-28 ENCOUNTER — Non-Acute Institutional Stay (SKILLED_NURSING_FACILITY): Payer: Medicare Other | Admitting: Family

## 2015-05-28 ENCOUNTER — Encounter: Payer: Self-pay | Admitting: Family

## 2015-05-28 DIAGNOSIS — D72829 Elevated white blood cell count, unspecified: Secondary | ICD-10-CM | POA: Diagnosis not present

## 2015-05-28 NOTE — Progress Notes (Signed)
Location:  [facility] Room Number: [id number] Place of Service:  SNF (31) Provider:  Marlowe Sax, FNP-C   Leola Brazil, MD  Patient Care Team: Vincente Liberty, MD as PCP - General (Pulmonary Disease)  Extended Emergency Contact Information Primary Emergency Contact: [facility] Address: [place] [address] [address] Litter of [place] Phone: [phone] Mobile Phone: [phone] Relation: Spouse Secondary Emergency Contact: [place] of [place] Mobile Phone: [phone] Relation: Son  Code Status:  Full Code  Goals of care: Advanced Directive information Advanced Directives 05/18/2015  Does patient have an advance directive? No  Would patient like information on creating an advanced directive? No - patient declined information     Chief Complaint  Patient presents with  . Acute Visit    Abnormal Labs follow-up    HPI:  Pt is a 82 y.o. male seen today at [facility]  for an acute visit for evaluation of leukocytosis. He is post hospital admission from 05/17/15- 05/21/15 post fall with right femoral shaft fracture. He underwent surgical repair on 05/19/15.He is seen today in his room with wife at bedside. He complains of having loose stool request for stool softeners to be  Held. His recent Lab results showed WBC 12.6 (05/27/2015). He denies any fever, chills, cough or shortness of breath. Facility staff and patient's wife reports no new concerns.   Past Medical History  Diagnosis Date  . CAD (coronary artery disease)   . Essential and other specified forms of tremor 07/15/2012  . RBBB   . Mobitz type 2 second degree atrioventricular block   . HTN (hypertension) 01/30/2013  . Urinary frequency   . Cancer ([place])     hx of bladder cancer- 2002   . Dementia due to general medical condition without behavioral disturbance     unspecified (per wife) dx at [facility]   Past  Surgical History  Procedure Laterality Date  . Knee surgery Right 2006  . Coronary artery bypass graft  05/10/2007    LIMA to LAD, SVG to 1st and 2nd OM and SVG to RCA  . Cardiac catheterization  04/27/2007    3 vessel disease  . Coronary angioplasty  1988  . Coronary angioplasty with stent placement  1998    LAD  . US echocardiography  10/01/2010    mild concentric LVH,LA mildly dilated,RA mild to mod. dilated,mild MR,mild to mod. TR,mild AI,mild PI,sinus rhythm w/pauses  . Nm myocar perf wall motion  04/20/2007    mod. reversible ischemia  . Total knee arthroplasty Left 05/08/2015    Procedure: LEFT TOTAL KNEE ARTHROPLASTY;  Surgeon: Latanya Maudlin, MD;  Location: [facility] ORS;  Service: Orthopedics;  Laterality: Left;  . Femur im nail Right 05/19/2015    Procedure: INTRAMEDULLARY (IM) RETROGRADE FEMORAL NAILING with ZIMMER CABLES;  Surgeon: Rod Can, MD;  Location: [facility] ORS;  Service: Orthopedics;  Laterality: Right;    No Known Allergies    Medication List       This list is accurate as of: 05/28/15  3:18 PM.  Always use your most recent med list.               acetaminophen 325 MG tablet  Commonly known as:  TYLENOL  Take 2 tablets (650 mg total) by mouth every 6 (six) hours as needed for mild pain (or Fever >/= 101).     amLODipine 2.5 MG tablet  Commonly known as:  NORVASC  Take 2.5 mg by mouth daily.     aspirin 81 MG EC tablet  Take 1 tablet (81 mg total) by mouth daily.     atorvastatin 20 MG tablet  Commonly known as:  LIPITOR  Take 20 mg by mouth at bedtime.     bisacodyl 10 MG suppository  Commonly known as:  DULCOLAX  Place 1 suppository (10 mg total) rectally daily as needed for moderate constipation.     docusate sodium 100 MG capsule  Commonly known as:  COLACE  Take 1 capsule (100 mg total) by mouth 2 (two) times daily.     donepezil 5 MG tablet  Commonly known as:  ARICEPT  Take 5 mg by mouth at bedtime.     enoxaparin 40 MG/0.4ML injection    Commonly known as:  LOVENOX  Inject 0.4 mLs (40 mg total) into the skin daily.     finasteride 5 MG tablet  Commonly known as:  PROSCAR  Take 5 mg by mouth daily.     hydrochlorothiazide 12.5 MG capsule  Commonly known as:  MICROZIDE  Take 12.5 mg by mouth daily.     HYDROcodone-acetaminophen 5-325 MG tablet  Commonly known as:  NORCO/VICODIN  Take 1-2 tablets by mouth every 4 (four) hours as needed for moderate pain.     senna-docusate 8.6-50 MG tablet  Commonly known as:  Senokot-S  Take 2 tablets by mouth 2 (two) times daily.     sodium phosphate 7-19 GM/118ML Enem  Place 133 mLs (1 enema total) rectally daily as needed for severe constipation.     tamsulosin 0.4 MG Caps capsule  Commonly known as:  FLOMAX  Take 0.4 mg by mouth daily.        Review of Systems  Constitutional: Negative for fever, chills, activity change, appetite change and fatigue.  HENT: Negative.   Eyes: Negative.   Respiratory: Negative for cough, chest tightness, shortness of breath and wheezing.   Cardiovascular: Negative for chest pain, palpitations and leg swelling.  Gastrointestinal: Negative for vomiting, abdominal pain, constipation and abdominal distention.       Loose stool  Genitourinary: Negative for dysuria, flank pain and decreased urine volume.  Skin: Negative.   Psychiatric/Behavioral: Negative.     Immunization History  Administered Date(s) Administered  . PPD Test 05/21/2015   Pertinent  Health Maintenance Due  Topic Date Due  . PNA vac Low Risk Adult (1 of 2 - PCV13) 10/07/1991  . INFLUENZA VACCINE  09/17/2015   No flowsheet data found. Functional Status Survey:    Filed Vitals:   05/28/15 1454  BP: 130/69  Pulse: 68  Temp: 97 F (36.1 C)  Resp: 20  Height: 5\' 7"  (1.702 m)  Weight: 178 lb (80.74 kg)  SpO2: 99%   Body mass index is 27.87 kg/(m^2). Physical Exam  Constitutional: He is oriented to person, place, and time. He appears well-developed and  well-nourished. No distress.  HENT:  Head: Normocephalic.  Mouth/Throat: Oropharynx is clear and moist.  Eyes: Conjunctivae and EOM are normal. Pupils are equal, round, and reactive to light. Right eye exhibits no discharge. Left eye exhibits no discharge. No scleral icterus.  Neck: Normal range of motion.  Cardiovascular: Normal rate, regular rhythm, normal heart sounds and intact distal pulses.  Exam reveals no gallop and no friction rub.   No murmur heard. Pulmonary/Chest: Effort normal and breath sounds normal. No respiratory distress. He has no wheezes. He has no rales.  Abdominal: Soft. Bowel sounds are normal. He exhibits no distension. There is no tenderness.  Musculoskeletal: He exhibits no edema or tenderness.  RLE limited ROM  Lymphadenopathy:    He has no cervical adenopathy.  Neurological: He is oriented to person, place, and time.  Skin: Skin is warm and dry. No rash noted. No erythema. No pallor.  Right Thigh surgical incision dry, clean and intact surround skin without signs of redness, swelling or tenderness.   Psychiatric: He has a normal mood and affect.    Labs reviewed:  Recent Labs  05/19/15 0411 05/20/15 0405 05/21/15 05/21/15 0404 05/27/15  NA 143 142 141 141 140  K 4.5 4.7  --  4.6 4.5  CL 112* 110  --  108  --   CO2 25 25  --  26  --   GLUCOSE 108* 133*  --  105*  --   BUN 22* 18 21 21* 20  CREATININE 1.11 1.08 1.0 0.99 1.2  CALCIUM 8.7* 8.5*  --  8.4*  --     Recent Labs  12/21/14 1119 05/01/15 1350 05/27/15  AST 16 25 23   ALT 9 13* 19  ALKPHOS 58 51 81  BILITOT 0.9 0.9  --   PROT 7.3 7.5  --   ALBUMIN 4.0 4.2  --     Recent Labs  05/01/15 1350  05/17/15 1629  05/19/15 0411 05/20/15 0405 05/21/15 05/21/15 0404 05/27/15  WBC 3.8*  < > 9.4  < > 9.6 14.5* 12.1 12.1* 12.6  NEUTROABS 1.8  --  7.6  --   --   --   --   --   --   HGB 13.4  < > 10.8*  < > 8.6* 9.0*  --  9.2* 10.7*  HCT 39.5  < > 32.3*  < > 25.3* 25.6*  --  27.7* 34*  MCV  98.0  < > 100.0  < > 99.2 92.1  --  95.2  --   PLT 144*  < > 265  < > 224 194  --  230 323  < > = values in this interval not displayed. Lab Results  Component Value Date   TSH 3.520 07/22/2012   Lab Results  Component Value Date   HGBA1C  05/06/2007    5.1 (NOTE)   The ADA recommends the following therapeutic goals for glycemic   control related to Hgb A1C measurement:   Goal of Therapy:   < 7.0% Hgb A1C   Action Suggested:  > 8.0% Hgb A1C   Ref:  Diabetes Care, 22, Suppl. 1, 1999   Lab Results  Component Value Date   CHOL 156 12/21/2014   HDL 66 12/21/2014   LDLCALC 74 12/21/2014   TRIG 81 12/21/2014   CHOLHDL 2.4 12/21/2014    Significant Diagnostic Results in last 30 days:  Dg Chest 2 View  05/01/2015  CLINICAL DATA:  Preoperative examination. Patient for knee replacement. Initial encounter. EXAM: CHEST  2 VIEW COMPARISON:  PA and lateral chest 06/02/2007. FINDINGS: The lungs are clear. The patient is status post CABG. Eventration of the right hemidiaphragm is unchanged. Heart size is upper normal. No pneumothorax or pleural effusion. IMPRESSION: No acute disease. Electronically Signed   By: Inge Rise M.D.   On: 05/01/2015 14:24   Dg Chest Port 1 View  05/17/2015  CLINICAL DATA:  Preoperative for ORIF right femur fracture EXAM: PORTABLE CHEST 1 VIEW COMPARISON:  05/01/2015 chest radiograph. FINDINGS: Sternotomy wires appear aligned and  intact. CABG clips overlie the mediastinum. Stable cardiomediastinal silhouette with top-normal heart size. No pneumothorax. No pleural effusion. Lungs appear clear, with no acute consolidative airspace disease and no pulmonary edema. IMPRESSION: No active disease. Electronically Signed   By: Ilona Sorrel M.D.   On: 05/17/2015 16:34   Dg C-arm 61-120 Min-no Report  05/19/2015  CLINICAL DATA: surgery C-ARM 61-120 MINUTES Fluoroscopy was utilized by the requesting physician.  No radiographic interpretation.   Dg Femur, Min 2 Views  Right  05/19/2015  CLINICAL DATA:  ORIF right femur fracture. EXAM: DG C-ARM 61-120 MIN-NO REPORT; RIGHT FEMUR 2 VIEWS COMPARISON:  05/17/2015 FINDINGS: Four intraoperative spot views of the right femur are submitted postoperatively for interpretation. An intramedullary nail within the right femur is identified traversing a oblique fracture of the distal right femur, in near-anatomic alignment position. No gross complicating features noted. IMPRESSION: Placement of right femoral intramedullary nail traversing a distal right femur fracture, in near-anatomic alignment and position. Electronically Signed   By: Margarette Canada M.D.   On: 05/19/2015 10:27   Dg Femur, Min 2 Views Right  05/17/2015  CLINICAL DATA:  82 year old male who fell at home. Pain. Initial encounter. EXAM: RIGHT FEMUR 2 VIEWS COMPARISON:  None. FINDINGS: Right femoral head normally located. Proximal right femur appears intact. Visible right hemipelvis grossly intact. Previous right total knee arthroplasty. Comminuted spiral fracture extending from about 14 cm proximal to the knee to the anterior superior margin of the femoral hardware (arrow image 4). Anterior displacement 1 full shaft width. Moderate anterior angulation. Over riding of fracture fragments up to 4 cm. Mild medial displacement and lateral angulation. Anterior displacement of a large butterfly fragment. Incidental external zipper artifact along the lateral knee. The knee arthroplasty components remain normally aligned. Dystrophic calcification about the patella. There does appear to be hemorrhage within the joint. Grossly intact proximal tibia and fibula. IMPRESSION: 1. Long segment comminuted spiral fracture involving the distal third right femoral shaft and extending to the proximal margin of the right knee femoral implant. Large butterfly fragment. 2. One full shaft wdith anterior displacement with 4 cm of overriding, and anterior as well as medial angulation. 3. Suggestion of right  knee hemarthrosis, but the knee hardware appears normally aligned. 4. Proximal right femur intact. Electronically Signed   By: Genevie Ann M.D.   On: 05/17/2015 16:11   Dg Femur Port, Min 2 Views Right  05/19/2015  CLINICAL DATA:  Internal fixation of right femur fracture. EXAM: RIGHT FEMUR PORTABLE 1 VIEW COMPARISON:  Prior studies FINDINGS: An intra medullary nail is identified within the right femur, traversing a distal femoral fracture, in near-anatomic alignment and position. Right total knee arthroplasty changes are present. No definite complicating hardware features noted. IMPRESSION: IM nail traversing a distal right femur fracture, in near-anatomic alignment and position. No definite complicating features. Electronically Signed   By: Margarette Canada M.D.   On: 05/19/2015 12:44    Assessment/Plan Leukocytosis  Afebrile. S/ppost fall with right femoral shaft fracture with surgical repair on 05/19/15.  WBC 12.6 neutrophils 9.7. Exam findings negative. Will obtain portable CXR 2 views and urine specimen for U/A and C/S. Continue to monitor.    Family/ staff Communication: Reviewed plan with patient , patient's wife and facility Nurse supervisor.   Labs/tests ordered:  CXR 2 views and urine specimen for U/A and C/S.

## 2015-06-05 DIAGNOSIS — Z96651 Presence of right artificial knee joint: Secondary | ICD-10-CM | POA: Diagnosis not present

## 2015-06-05 DIAGNOSIS — Z471 Aftercare following joint replacement surgery: Secondary | ICD-10-CM | POA: Diagnosis not present

## 2015-06-05 DIAGNOSIS — S72391D Other fracture of shaft of right femur, subsequent encounter for closed fracture with routine healing: Secondary | ICD-10-CM | POA: Diagnosis not present

## 2015-06-05 DIAGNOSIS — Z96652 Presence of left artificial knee joint: Secondary | ICD-10-CM | POA: Diagnosis not present

## 2015-06-05 DIAGNOSIS — M1712 Unilateral primary osteoarthritis, left knee: Secondary | ICD-10-CM | POA: Diagnosis not present

## 2015-06-10 ENCOUNTER — Non-Acute Institutional Stay (SKILLED_NURSING_FACILITY): Payer: Medicare Other | Admitting: Family

## 2015-06-10 ENCOUNTER — Encounter: Payer: Self-pay | Admitting: Family

## 2015-06-10 DIAGNOSIS — K5901 Slow transit constipation: Secondary | ICD-10-CM

## 2015-06-10 NOTE — Progress Notes (Signed)
Location:  [place] Room Number: [id number] Place of Service:  SNF (31) Provider:  Marlowe Sax, FNP-C Blanchie Serve, MD   Leola Brazil, MD  Patient Care Team: Vincente Liberty, MD as PCP - General (Pulmonary Disease)  Extended Emergency Contact Information Primary Emergency Contact: [facility] Address: [place] [address] [place] of [place] Phone: [phone] Mobile Phone: [phone] Relation: Spouse Secondary Emergency Contact: [place] of [place] Mobile Phone: [phone] Relation: Son  Code Status:  Full Code  Goals of care: Advanced Directive information Advanced Directives 05/18/2015  Does patient have an advance directive? No  Would patient like information on creating an advanced directive? No - patient declined information     Chief Complaint  Patient presents with  . Acute Visit    Follow-up of Constipation    HPI:  Pt is a 82 y.o. male seen today at [facility] for an acute visit for evaluation of constipation. He has a medical history of HTN, CAD, bladder CA, Dementia  Among others. He states was constipation a day ago but was given MOM by facility Nurse. He also had prune juice prior to visit with good results. He denies any abdominal pain, nausea or vomiting.    Past Medical History  Diagnosis Date  . CAD (coronary artery disease)   . Essential and other specified forms of tremor 07/15/2012  . RBBB   . Mobitz type 2 second degree atrioventricular block   . HTN (hypertension) 01/30/2013  . Urinary frequency   . Cancer ([place])     hx of bladder cancer- 2002   . Dementia due to general medical condition without behavioral disturbance     unspecified (per wife) dx at [facility]   Past Surgical History  Procedure Laterality Date  . Knee surgery Right 2006  . Coronary artery bypass graft  05/10/2007    LIMA to LAD, SVG to 1st and 2nd OM and  SVG to RCA  . Cardiac catheterization  04/27/2007    3 vessel disease  . Coronary angioplasty  1988  . Coronary angioplasty with stent placement  1998    LAD  . US echocardiography  10/01/2010    mild concentric LVH,LA mildly dilated,RA mild to mod. dilated,mild MR,mild to mod. TR,mild AI,mild PI,sinus rhythm w/pauses  . Nm myocar perf wall motion  04/20/2007    mod. reversible ischemia  . Total knee arthroplasty Left 05/08/2015    Procedure: LEFT TOTAL KNEE ARTHROPLASTY;  Surgeon: Latanya Maudlin, MD;  Location: [facility];  Service: Orthopedics;  Laterality: Left;  . Femur im nail Right 05/19/2015    Procedure: INTRAMEDULLARY (IM) RETROGRADE FEMORAL NAILING with ZIMMER CABLES;  Surgeon: Rod Can, MD;  Location: [facility];  Service: Orthopedics;  Laterality: Right;    No Known Allergies    Medication List       This list is accurate as of: 06/10/15  3:54 PM.  Always use your most recent med list.               acetaminophen 325 MG tablet  Commonly known as:  TYLENOL  Take 2 tablets (650 mg total) by mouth every 6 (six) hours as needed for mild pain (or Fever >/= 101).     amLODipine 2.5 MG tablet  Commonly known as:  NORVASC  Take 2.5 mg by mouth daily.     aspirin 81 MG EC tablet  Take 1 tablet (81 mg total) by mouth daily.     atorvastatin 20 MG tablet  Commonly known as:  LIPITOR  Take 20 mg by mouth at bedtime.     bisacodyl 10 MG suppository  Commonly known as:  DULCOLAX  Place 1 suppository (10 mg total) rectally daily as needed for moderate constipation.     donepezil 5 MG tablet  Commonly known as:  ARICEPT  Take 5 mg by mouth at bedtime.     enoxaparin 40 MG/0.4ML injection  Commonly known as:  LOVENOX  Inject 0.4 mLs (40 mg total) into the skin daily.     finasteride 5 MG tablet  Commonly known as:  PROSCAR  Take 5 mg by mouth daily.     hydrochlorothiazide 12.5 MG capsule  Commonly known as:  MICROZIDE  Take 12.5 mg by mouth daily.      HYDROcodone-acetaminophen 5-325 MG tablet  Commonly known as:  NORCO/VICODIN  Take 1-2 tablets by mouth every 4 (four) hours as needed for moderate pain.     senna-docusate 8.6-50 MG tablet  Commonly known as:  Senokot-S  Take 2 tablets by mouth 2 (two) times daily.     sodium phosphate 7-19 GM/118ML Enem  Place 133 mLs (1 enema total) rectally daily as needed for severe constipation.     tamsulosin 0.4 MG Caps capsule  Commonly known as:  FLOMAX  Take 0.4 mg by mouth daily. After the evening meal per pharmacy recommendation        Review of Systems  Constitutional: Negative for fever, chills, activity change, appetite change and fatigue.  HENT: Negative.   Eyes: Negative.   Respiratory: Negative for cough, chest tightness, shortness of breath and wheezing.   Cardiovascular: Negative for chest pain, palpitations and leg swelling.  Gastrointestinal: Negative for nausea, vomiting, abdominal pain, diarrhea and abdominal distention.  Genitourinary: Negative for dysuria, flank pain and decreased urine volume.  Skin: Negative.        Bilat. Knee surgical incision   Psychiatric/Behavioral: Negative.     Immunization History  Administered Date(s) Administered  . PPD Test 05/21/2015, 06/04/2015   Pertinent  Health Maintenance Due  Topic Date Due  . PNA vac Low Risk Adult (1 of 2 - PCV13) 10/07/1991  . INFLUENZA VACCINE  09/17/2015   No flowsheet data found. Functional Status Survey:    Filed Vitals:   06/10/15 1532  BP: 124/68  Pulse: 77  Temp: 98.7 F (37.1 C)  Resp: 18  Height: 5\' 7"  (1.702 m)  Weight: 178 lb (80.74 kg)   Body mass index is 27.87 kg/(m^2). Physical Exam  Constitutional: He is oriented to person, place, and time. He appears well-developed and well-nourished. No distress.  HENT:  Head: Normocephalic.  Mouth/Throat: Oropharynx is clear and moist.  Eyes: Conjunctivae and EOM are normal. Pupils are equal, round, and reactive to light. Right eye  exhibits no discharge. Left eye exhibits no discharge. No scleral icterus.  Neck: Normal range of motion. No JVD present.  Cardiovascular: Normal rate, regular rhythm, normal heart sounds and intact distal pulses.   Pulmonary/Chest: Effort normal and breath sounds normal. No respiratory distress. He has no wheezes. He has no rales.  Abdominal: Soft. Bowel sounds are normal. He exhibits no distension. There is no tenderness. There is no rebound and no guarding.  Lymphadenopathy:    He has no cervical adenopathy.  Neurological: He is oriented to person, place, and time.  Skin: Skin is warm and dry. No rash  noted. He is not diaphoretic. No erythema. No pallor.  Bilateral Knee surgical incision with steri-strips. No redness, tenderness,  swelling or drainage noted.   Psychiatric: He has a normal mood and affect.    Labs reviewed:  Recent Labs  05/19/15 0411 05/20/15 0405 05/21/15 05/21/15 0404 05/27/15  NA 143 142 141 141 140  K 4.5 4.7  --  4.6 4.5  CL 112* 110  --  108  --   CO2 25 25  --  26  --   GLUCOSE 108* 133*  --  105*  --   BUN 22* 18 21 21* 20  CREATININE 1.11 1.08 1.0 0.99 1.2  CALCIUM 8.7* 8.5*  --  8.4*  --     Recent Labs  12/21/14 1119 05/01/15 1350 05/27/15  AST 16 25 23   ALT 9 13* 19  ALKPHOS 58 51 81  BILITOT 0.9 0.9  --   PROT 7.3 7.5  --   ALBUMIN 4.0 4.2  --     Recent Labs  05/01/15 1350  05/17/15 1629  05/19/15 0411 05/20/15 0405 05/21/15 05/21/15 0404 05/27/15  WBC 3.8*  < > 9.4  < > 9.6 14.5* 12.1 12.1* 12.6  NEUTROABS 1.8  --  7.6  --   --   --   --   --   --   HGB 13.4  < > 10.8*  < > 8.6* 9.0*  --  9.2* 10.7*  HCT 39.5  < > 32.3*  < > 25.3* 25.6*  --  27.7* 34*  MCV 98.0  < > 100.0  < > 99.2 92.1  --  95.2  --   PLT 144*  < > 265  < > 224 194  --  230 323  < > = values in this interval not displayed. Lab Results  Component Value Date   TSH 3.520 07/22/2012   Lab Results  Component Value Date   HGBA1C  05/06/2007    5.1 (NOTE)    The ADA recommends the following therapeutic goals for glycemic   control related to Hgb A1C measurement:   Goal of Therapy:   < 7.0% Hgb A1C   Action Suggested:  > 8.0% Hgb A1C   Ref:  Diabetes Care, 22, Suppl. 1, 1999   Lab Results  Component Value Date   CHOL 156 12/21/2014   HDL 66 12/21/2014   LDLCALC 74 12/21/2014   TRIG 81 12/21/2014   CHOLHDL 2.4 12/21/2014    Significant Diagnostic Results in last 30 days:  Dg Chest Port 1 View  05/17/2015  CLINICAL DATA:  Preoperative for ORIF right femur fracture EXAM: PORTABLE CHEST 1 VIEW COMPARISON:  05/01/2015 chest radiograph. FINDINGS: Sternotomy wires appear aligned and intact. CABG clips overlie the mediastinum. Stable cardiomediastinal silhouette with top-normal heart size. No pneumothorax. No pleural effusion. Lungs appear clear, with no acute consolidative airspace disease and no pulmonary edema. IMPRESSION: No active disease. Electronically Signed   By: Ilona Sorrel M.D.   On: 05/17/2015 16:34   Dg C-arm 61-120 Min-no Report  05/19/2015  CLINICAL DATA: surgery C-ARM 61-120 MINUTES Fluoroscopy was utilized by the requesting physician.  No radiographic interpretation.   Dg Femur, Min 2 Views Right  05/19/2015  CLINICAL DATA:  ORIF right femur fracture. EXAM: DG C-ARM 61-120 MIN-NO REPORT; RIGHT FEMUR 2 VIEWS COMPARISON:  05/17/2015 FINDINGS: Four intraoperative spot views of the right femur are submitted postoperatively for interpretation. An intramedullary nail within the right femur is identified traversing a  oblique fracture of the distal right femur, in near-anatomic alignment position. No gross complicating features noted. IMPRESSION: Placement of right femoral intramedullary nail traversing a distal right femur fracture, in near-anatomic alignment and position. Electronically Signed   By: Margarette Canada M.D.   On: 05/19/2015 10:27   Dg Femur, Min 2 Views Right  05/17/2015  CLINICAL DATA:  82 year old male who fell at home. Pain. Initial  encounter. EXAM: RIGHT FEMUR 2 VIEWS COMPARISON:  None. FINDINGS: Right femoral head normally located. Proximal right femur appears intact. Visible right hemipelvis grossly intact. Previous right total knee arthroplasty. Comminuted spiral fracture extending from about 14 cm proximal to the knee to the anterior superior margin of the femoral hardware (arrow image 4). Anterior displacement 1 full shaft width. Moderate anterior angulation. Over riding of fracture fragments up to 4 cm. Mild medial displacement and lateral angulation. Anterior displacement of a large butterfly fragment. Incidental external zipper artifact along the lateral knee. The knee arthroplasty components remain normally aligned. Dystrophic calcification about the patella. There does appear to be hemorrhage within the joint. Grossly intact proximal tibia and fibula. IMPRESSION: 1. Long segment comminuted spiral fracture involving the distal third right femoral shaft and extending to the proximal margin of the right knee femoral implant. Large butterfly fragment. 2. One full shaft wdith anterior displacement with 4 cm of overriding, and anterior as well as medial angulation. 3. Suggestion of right knee hemarthrosis, but the knee hardware appears normally aligned. 4. Proximal right femur intact. Electronically Signed   By: Genevie Ann M.D.   On: 05/17/2015 16:11   Dg Femur Port, Min 2 Views Right  05/19/2015  CLINICAL DATA:  Internal fixation of right femur fracture. EXAM: RIGHT FEMUR PORTABLE 1 VIEW COMPARISON:  Prior studies FINDINGS: An intra medullary nail is identified within the right femur, traversing a distal femoral fracture, in near-anatomic alignment and position. Right total knee arthroplasty changes are present. No definite complicating hardware features noted. IMPRESSION: IM nail traversing a distal right femur fracture, in near-anatomic alignment and position. No definite complicating features. Electronically Signed   By: Margarette Canada  M.D.   On: 05/19/2015 12:44    Assessment/Plan Constipation  Prune juice and MOM effective. Continue current medication. Encourage fluid intake and fiber diet. Continue to monitor.    Family/ staff Communication: Reviewed plan with patient and facility Nurse supervisor.   Labs/tests ordered:  None

## 2015-06-17 DIAGNOSIS — Z4789 Encounter for other orthopedic aftercare: Secondary | ICD-10-CM | POA: Diagnosis not present

## 2015-06-17 DIAGNOSIS — S72301D Unspecified fracture of shaft of right femur, subsequent encounter for closed fracture with routine healing: Secondary | ICD-10-CM | POA: Diagnosis not present

## 2015-06-17 DIAGNOSIS — R278 Other lack of coordination: Secondary | ICD-10-CM | POA: Diagnosis not present

## 2015-06-17 DIAGNOSIS — E785 Hyperlipidemia, unspecified: Secondary | ICD-10-CM | POA: Diagnosis not present

## 2015-06-17 DIAGNOSIS — M6281 Muscle weakness (generalized): Secondary | ICD-10-CM | POA: Diagnosis not present

## 2015-06-17 DIAGNOSIS — R262 Difficulty in walking, not elsewhere classified: Secondary | ICD-10-CM | POA: Diagnosis not present

## 2015-06-17 DIAGNOSIS — Z9181 History of falling: Secondary | ICD-10-CM | POA: Diagnosis not present

## 2015-06-17 DIAGNOSIS — Z471 Aftercare following joint replacement surgery: Secondary | ICD-10-CM | POA: Diagnosis not present

## 2015-06-17 DIAGNOSIS — Z96653 Presence of artificial knee joint, bilateral: Secondary | ICD-10-CM | POA: Diagnosis not present

## 2015-06-17 DIAGNOSIS — Z96652 Presence of left artificial knee joint: Secondary | ICD-10-CM | POA: Diagnosis not present

## 2015-06-17 DIAGNOSIS — N400 Enlarged prostate without lower urinary tract symptoms: Secondary | ICD-10-CM | POA: Diagnosis not present

## 2015-06-17 DIAGNOSIS — I10 Essential (primary) hypertension: Secondary | ICD-10-CM | POA: Diagnosis not present

## 2015-06-18 ENCOUNTER — Non-Acute Institutional Stay (SKILLED_NURSING_FACILITY): Payer: Medicare Other | Admitting: Family

## 2015-06-18 ENCOUNTER — Encounter: Payer: Self-pay | Admitting: Family

## 2015-06-18 DIAGNOSIS — I10 Essential (primary) hypertension: Secondary | ICD-10-CM

## 2015-06-18 DIAGNOSIS — Z96653 Presence of artificial knee joint, bilateral: Secondary | ICD-10-CM

## 2015-06-18 DIAGNOSIS — N400 Enlarged prostate without lower urinary tract symptoms: Secondary | ICD-10-CM

## 2015-06-18 DIAGNOSIS — E785 Hyperlipidemia, unspecified: Secondary | ICD-10-CM

## 2015-06-18 HISTORY — DX: Benign prostatic hyperplasia without lower urinary tract symptoms: N40.0

## 2015-06-18 NOTE — Progress Notes (Signed)
Location:  [place] Room Number: [id number] Place of Service:  SNF [phone])  Provider: Marlowe Sax, FNP-C  PCP: Leola Brazil, MD Patient Care Team: Vincente Liberty, MD as PCP - General (Pulmonary Disease)  Extended Emergency Contact Information Primary Emergency Contact: Goethe,Mattie Address: [place] [address] [place] Phone: [phone] Mobile Phone: [phone] Relation: Spouse Secondary Emergency Contact: [place] of [place] Mobile Phone: [phone] Relation: Son  Code Status:Full Code  Goals of care:  Advanced Directive information Advanced Directives 05/18/2015  Does patient have an advance directive? No  Would patient like information on creating an advanced directive? No - patient declined information     No Known Allergies  Chief Complaint  Patient presents with  . Discharge Note    Discharged from SNF    HPI:  82 y.o. male seen at [facility] for discharge home. He was here for short term rehabilitation post hospital admission from 05/17/15- 05/21/15 post fall with right femoral shaft fracture. He underwent surgical repair on 05/19/15. Also S/p bilateral knee repair. He is seen in his room today with wife at bedside. He denies any acute issues this visit. He states pain under control with current medication. He has worked well with PT/OT now stable for discharge home with PT/OT to continue with ROM, exercise, Gait stability and muscle strengthening.He was treated with oral antibiotics for UTI  during his course of admission in the facility. He will require a standard wheelchair with cushion, anti tipper, extended brake handles and elevating leg rests to allow him to maintain current level of independence with ADL's which cannot be achieved with walker or cane. Home health services will be arraged by the facility social worker prior to discharge.      Past Medical History  Diagnosis Date  . CAD (coronary artery disease)   . Essential and other specified forms of tremor 07/15/2012  . RBBB   . Mobitz type 2 second degree atrioventricular block   . HTN (hypertension) 01/30/2013  . Urinary frequency   . Cancer ([place])     hx of bladder cancer- 2002   . Dementia due to general medical condition without behavioral disturbance     unspecified (per wife) dx at [facility]    Past Surgical History  Procedure Laterality Date  . Knee surgery Right 2006  . Coronary artery bypass graft  05/10/2007    LIMA to LAD, SVG to 1st and 2nd OM and SVG to RCA  . Cardiac catheterization  04/27/2007    3 vessel disease  . Coronary angioplasty  1988  . Coronary angioplasty with stent placement  1998    LAD  . US echocardiography  10/01/2010    mild concentric LVH,LA mildly dilated,RA mild to mod. dilated,mild MR,mild to mod. TR,mild AI,mild PI,sinus rhythm w/pauses  . Nm myocar perf wall motion  04/20/2007    mod. reversible ischemia  . Total knee arthroplasty Left 05/08/2015    Procedure: LEFT TOTAL KNEE ARTHROPLASTY;  Surgeon: Latanya Maudlin, MD;  Location: [facility];  Service: Orthopedics;  Laterality: Left;  . Femur im nail Right 05/19/2015    Procedure: INTRAMEDULLARY (IM) RETROGRADE FEMORAL NAILING with ZIMMER CABLES;  Surgeon: Rod Can, MD;  Location: [facility];  Service: Orthopedics;  Laterality: Right;      reports that he has never smoked. He has never used smokeless tobacco. He reports that  he does not drink alcohol or use illicit drugs. Social History   Social History  . Marital Status: Married    Spouse Name: N/A  . Number of Children: N/A  . Years of Education: N/A   Occupational History  . Not on file.   Social History Main Topics  . Smoking status: Never Smoker   . Smokeless tobacco: Never Used  . Alcohol Use: No  . Drug Use: No  . Sexual Activity: Not on file   Other Topics Concern  . Not on file   Social History Narrative    Functional Status Survey:    No Known Allergies  Pertinent  Health Maintenance Due  Topic Date Due  . PNA vac Low Risk Adult (1 of 2 - PCV13) 10/07/1991  . INFLUENZA VACCINE  09/17/2015    Medications:   Medication List       This list is accurate as of: 06/18/15 10:32 AM.  Always use your most recent med list.               acetaminophen 325 MG tablet  Commonly known as:  TYLENOL  Take 2 tablets (650 mg total) by mouth every 6 (six) hours as needed for mild pain (or Fever >/= 101).     amLODipine 2.5 MG tablet  Commonly known as:  NORVASC  Take 2.5 mg by mouth daily.     aspirin 325 MG EC tablet  Take 325 mg by mouth daily.     atorvastatin 20 MG tablet  Commonly known as:  LIPITOR  Take 20 mg by mouth at bedtime.     bisacodyl 10 MG suppository  Commonly known as:  DULCOLAX  Place 1 suppository (10 mg total) rectally daily as needed for moderate constipation.     donepezil 5 MG tablet  Commonly known as:  ARICEPT  Take 5 mg by mouth at bedtime.     enoxaparin 40 MG/0.4ML injection  Commonly known as:  LOVENOX  Inject 0.4 mLs (40 mg total) into the skin daily.     finasteride 5 MG tablet  Commonly known as:  PROSCAR  Take 5 mg by mouth daily.     hydrochlorothiazide 12.5 MG capsule  Commonly known as:  MICROZIDE  Take 12.5 mg by mouth daily.     HYDROcodone-acetaminophen 5-325 MG tablet  Commonly known as:  NORCO/VICODIN  Take 1-2 tablets by mouth every 4 (four) hours as needed for moderate pain.     senna-docusate 8.6-50 MG tablet  Commonly known as:  Senokot-S  Take 2 tablets by mouth 2 (two) times daily.     sodium phosphate 7-19 GM/118ML Enem  Place 133 mLs (1 enema total) rectally daily as needed for severe constipation.     tamsulosin 0.4 MG Caps capsule  Commonly known as:  FLOMAX  Take 0.4 mg by mouth daily. After the evening meal per pharmacy recommendation        Review of Systems  Constitutional: Negative for fever, chills,  activity change, appetite change and fatigue.  HENT: Negative for congestion, rhinorrhea, sinus pressure, sneezing and sore throat.   Eyes: Negative.   Respiratory: Negative for cough, chest tightness, shortness of breath and wheezing.   Cardiovascular: Negative for chest pain, palpitations and leg swelling.  Gastrointestinal: Negative for nausea, vomiting, abdominal pain, diarrhea, constipation and abdominal distention.  Endocrine: Negative.   Genitourinary: Negative for dysuria, urgency, frequency, hematuria and flank pain.  Musculoskeletal: Positive for gait problem.  Knee pain   Skin: Negative for color change, pallor and rash.       Bilateral knee surgical incision   Neurological: Negative.   Psychiatric/Behavioral: Negative.     Filed Vitals:   06/18/15 0956  BP: 126/62  Pulse: 58  Temp: 97.7 F (36.5 C)  Resp: 20  Height: 5\' 7"  (1.702 m)  SpO2: 98%   There is no weight on file to calculate BMI. Physical Exam  Constitutional: He is oriented to person, place, and time. He appears well-developed and well-nourished. No distress.  HENT:  Head: Normocephalic.  Mouth/Throat: Oropharynx is clear and moist.  Eyes: Conjunctivae and EOM are normal. Pupils are equal, round, and reactive to light. Right eye exhibits no discharge. Left eye exhibits no discharge. No scleral icterus.  Neck: Normal range of motion. No JVD present.  Cardiovascular: Normal rate, normal heart sounds and intact distal pulses.  Exam reveals no gallop and no friction rub.   No murmur heard. Pulmonary/Chest: Effort normal and breath sounds normal. No respiratory distress. He has no wheezes. He has no rales.  Abdominal: Soft. Bowel sounds are normal. He exhibits no distension. There is no tenderness. There is no rebound and no guarding.  Musculoskeletal: He exhibits no edema or tenderness.  Limited ROM to knees due to surgical incision  Lymphadenopathy:    He has no cervical adenopathy.  Neurological: He  is oriented to person, place, and time.  Skin: Skin is warm and dry. No rash noted. No erythema.  Bilateral knee incision with steri strips dry, clean and intact. Surrounding skin without any redness, swelling or tenderness.   Psychiatric: He has a normal mood and affect.    Labs reviewed: Basic Metabolic Panel:  Recent Labs  05/19/15 0411 05/20/15 0405 05/21/15 05/21/15 0404 05/27/15  NA 143 142 141 141 140  K 4.5 4.7  --  4.6 4.5  CL 112* 110  --  108  --   CO2 25 25  --  26  --   GLUCOSE 108* 133*  --  105*  --   BUN 22* 18 21 21* 20  CREATININE 1.11 1.08 1.0 0.99 1.2  CALCIUM 8.7* 8.5*  --  8.4*  --    Liver Function Tests:  Recent Labs  12/21/14 1119 05/01/15 1350 05/27/15  AST 16 25 23   ALT 9 13* 19  ALKPHOS 58 51 81  BILITOT 0.9 0.9  --   PROT 7.3 7.5  --   ALBUMIN 4.0 4.2  --    No results for input(s): LIPASE, AMYLASE in the last 8760 hours. No results for input(s): AMMONIA in the last 8760 hours. CBC:  Recent Labs  05/01/15 1350  05/17/15 1629  05/19/15 0411 05/20/15 0405 05/21/15 05/21/15 0404 05/27/15  WBC 3.8*  < > 9.4  < > 9.6 14.5* 12.1 12.1* 12.6  NEUTROABS 1.8  --  7.6  --   --   --   --   --   --   HGB 13.4  < > 10.8*  < > 8.6* 9.0*  --  9.2* 10.7*  HCT 39.5  < > 32.3*  < > 25.3* 25.6*  --  27.7* 34*  MCV 98.0  < > 100.0  < > 99.2 92.1  --  95.2  --   PLT 144*  < > 265  < > 224 194  --  230 323  < > = values in this interval not displayed. Cardiac Enzymes: No results for input(s): CKTOTAL,  CKMB, CKMBINDEX, TROPONINI in the last 8760 hours. BNP: Invalid input(s): POCBNP CBG: No results for input(s): GLUCAP in the last 8760 hours.  Procedures and Imaging Studies During Stay: Dg Femur [place], [place] 2 Views Right  05/19/2015  CLINICAL DATA:  Internal fixation of right femur fracture. EXAM: RIGHT FEMUR PORTABLE 1 VIEW COMPARISON:  Prior studies FINDINGS: An intra medullary nail is identified within the right femur, traversing a distal femoral  fracture, in near-anatomic alignment and position. Right total knee arthroplasty changes are present. No definite complicating hardware features noted. IMPRESSION: IM nail traversing a distal right femur fracture, in near-anatomic alignment and position. No definite complicating features. Electronically Signed   By: Margarette Canada M.D.   On: 05/19/2015 12:44    Assessment/Plan:   S/p Total knee Replacement  Afebrile. Has worked well with PT/OT for ROM, exercise, Gait stability and muscle strengthening. Continue Levonox 40 mcg/0.4 ml SQ daily x 1 more dose then begin Asprin 325 mg Tablet daily upon completion of Lovenox. Facility Nurse will provide Lovenox SQ injection self administration prior to discharge from facility. Continue Hydrocodone-APAP 5/325 mg Tablet Q 4 Hrs PRN for pain QTY 30.   S/p right femoral shaft Repair Continue with PT/OT ROM, exercise, Gait stability and muscle strengthening. Continue Levonox 40 mcg/0.4 ml SQ daily x 1 more dose then begin Asprin 325 mg Tablet daily upon completion of levonox. Facility Nurse will provide Levonox SQ injection self  administration prior to discharge from facility. Continue PRN Hydrocodone-APAP.    Dyslipidemia  Continue atorvastatin 20 mg Tablet. Monitor Lipid panel.  HTN B/p stable. Continue on Amlodipine 2.5 mg Tablet and  HCTZ 12.5 mg tablet. PCP to check BMP in 1-2 weeks.  BPH Continue on Finasteride 5 mg Tablet and Tamsulosin 0.4 mg Capsule.   Abnormal Gait  Has worked well with PT/OT will discharge home  PT/OT to continue with ROM, exercise, Gait stability and muscle strengthening.He will require a standard wheelchair with cushion, anti tipper, extended brake handles and elevating leg rests to allow him to maintain current level of independence with ADL's which cannot be achieved with walker or cane. Fall and safety precautions.   UTI  Resolved. Afebrile. Completed oral antibiotics.  Patient is being discharged with the following home  health services:   PT/OT to continue with ROM, exercise, Gait stability and muscle strengthening.  Patient is being discharged with the following durable medical equipment:   Standard wheelchair with cushion, anti tipper, extended brake handles and elevating leg rests to allow him to maintain current level of independence with ADL's which cannot be achieved with walker or cane.   RX written X 1 month supply   Patient has been advised to f/u with their PCP in 1-2 weeks to bring them up to date on their rehab stay.  Social services at facility was responsible for arranging this appointment.  Pt was provided with a 30 day supply of prescriptions for medications and refills must be obtained from their PCP.  For controlled substances, a more limited supply may be provided adequate until PCP appointment only.  Future labs/tests needed: CBC, BMP with PCP in 1-2 weeks.

## 2015-06-20 DIAGNOSIS — M9711XD Periprosthetic fracture around internal prosthetic right knee joint, subsequent encounter: Secondary | ICD-10-CM | POA: Diagnosis not present

## 2015-06-20 DIAGNOSIS — G250 Essential tremor: Secondary | ICD-10-CM | POA: Diagnosis not present

## 2015-06-20 DIAGNOSIS — I4510 Unspecified right bundle-branch block: Secondary | ICD-10-CM | POA: Diagnosis not present

## 2015-06-20 DIAGNOSIS — I2510 Atherosclerotic heart disease of native coronary artery without angina pectoris: Secondary | ICD-10-CM | POA: Diagnosis not present

## 2015-06-20 DIAGNOSIS — F0390 Unspecified dementia without behavioral disturbance: Secondary | ICD-10-CM | POA: Diagnosis not present

## 2015-06-20 DIAGNOSIS — I441 Atrioventricular block, second degree: Secondary | ICD-10-CM | POA: Diagnosis not present

## 2015-06-21 DIAGNOSIS — M1712 Unilateral primary osteoarthritis, left knee: Secondary | ICD-10-CM | POA: Diagnosis not present

## 2015-06-21 DIAGNOSIS — Z471 Aftercare following joint replacement surgery: Secondary | ICD-10-CM | POA: Diagnosis not present

## 2015-06-21 DIAGNOSIS — Z96652 Presence of left artificial knee joint: Secondary | ICD-10-CM | POA: Diagnosis not present

## 2015-06-24 DIAGNOSIS — R351 Nocturia: Secondary | ICD-10-CM | POA: Diagnosis not present

## 2015-06-24 DIAGNOSIS — N401 Enlarged prostate with lower urinary tract symptoms: Secondary | ICD-10-CM | POA: Diagnosis not present

## 2015-06-24 DIAGNOSIS — N138 Other obstructive and reflux uropathy: Secondary | ICD-10-CM | POA: Diagnosis not present

## 2015-06-24 DIAGNOSIS — N3941 Urge incontinence: Secondary | ICD-10-CM | POA: Diagnosis not present

## 2015-06-24 DIAGNOSIS — Z0000 Encounter for general adult medical examination without abnormal findings: Secondary | ICD-10-CM | POA: Diagnosis not present

## 2015-06-25 DIAGNOSIS — G250 Essential tremor: Secondary | ICD-10-CM | POA: Diagnosis not present

## 2015-06-25 DIAGNOSIS — F0390 Unspecified dementia without behavioral disturbance: Secondary | ICD-10-CM | POA: Diagnosis not present

## 2015-06-25 DIAGNOSIS — M9711XD Periprosthetic fracture around internal prosthetic right knee joint, subsequent encounter: Secondary | ICD-10-CM | POA: Diagnosis not present

## 2015-06-25 DIAGNOSIS — I4510 Unspecified right bundle-branch block: Secondary | ICD-10-CM | POA: Diagnosis not present

## 2015-06-25 DIAGNOSIS — I2510 Atherosclerotic heart disease of native coronary artery without angina pectoris: Secondary | ICD-10-CM | POA: Diagnosis not present

## 2015-06-25 DIAGNOSIS — I441 Atrioventricular block, second degree: Secondary | ICD-10-CM | POA: Diagnosis not present

## 2015-06-27 DIAGNOSIS — I4510 Unspecified right bundle-branch block: Secondary | ICD-10-CM | POA: Diagnosis not present

## 2015-06-27 DIAGNOSIS — M9711XD Periprosthetic fracture around internal prosthetic right knee joint, subsequent encounter: Secondary | ICD-10-CM | POA: Diagnosis not present

## 2015-06-27 DIAGNOSIS — F0390 Unspecified dementia without behavioral disturbance: Secondary | ICD-10-CM | POA: Diagnosis not present

## 2015-06-27 DIAGNOSIS — G250 Essential tremor: Secondary | ICD-10-CM | POA: Diagnosis not present

## 2015-06-27 DIAGNOSIS — I2510 Atherosclerotic heart disease of native coronary artery without angina pectoris: Secondary | ICD-10-CM | POA: Diagnosis not present

## 2015-06-27 DIAGNOSIS — I441 Atrioventricular block, second degree: Secondary | ICD-10-CM | POA: Diagnosis not present

## 2015-06-28 DIAGNOSIS — I441 Atrioventricular block, second degree: Secondary | ICD-10-CM | POA: Diagnosis not present

## 2015-06-28 DIAGNOSIS — F0390 Unspecified dementia without behavioral disturbance: Secondary | ICD-10-CM | POA: Diagnosis not present

## 2015-06-28 DIAGNOSIS — I2510 Atherosclerotic heart disease of native coronary artery without angina pectoris: Secondary | ICD-10-CM | POA: Diagnosis not present

## 2015-06-28 DIAGNOSIS — M9711XD Periprosthetic fracture around internal prosthetic right knee joint, subsequent encounter: Secondary | ICD-10-CM | POA: Diagnosis not present

## 2015-06-28 DIAGNOSIS — G250 Essential tremor: Secondary | ICD-10-CM | POA: Diagnosis not present

## 2015-06-28 DIAGNOSIS — I4510 Unspecified right bundle-branch block: Secondary | ICD-10-CM | POA: Diagnosis not present

## 2015-07-02 DIAGNOSIS — I441 Atrioventricular block, second degree: Secondary | ICD-10-CM | POA: Diagnosis not present

## 2015-07-02 DIAGNOSIS — I2510 Atherosclerotic heart disease of native coronary artery without angina pectoris: Secondary | ICD-10-CM | POA: Diagnosis not present

## 2015-07-02 DIAGNOSIS — G250 Essential tremor: Secondary | ICD-10-CM | POA: Diagnosis not present

## 2015-07-02 DIAGNOSIS — F0390 Unspecified dementia without behavioral disturbance: Secondary | ICD-10-CM | POA: Diagnosis not present

## 2015-07-02 DIAGNOSIS — M9711XD Periprosthetic fracture around internal prosthetic right knee joint, subsequent encounter: Secondary | ICD-10-CM | POA: Diagnosis not present

## 2015-07-02 DIAGNOSIS — I4510 Unspecified right bundle-branch block: Secondary | ICD-10-CM | POA: Diagnosis not present

## 2015-07-05 DIAGNOSIS — Z0000 Encounter for general adult medical examination without abnormal findings: Secondary | ICD-10-CM | POA: Diagnosis not present

## 2015-07-05 DIAGNOSIS — F0390 Unspecified dementia without behavioral disturbance: Secondary | ICD-10-CM | POA: Diagnosis not present

## 2015-07-05 DIAGNOSIS — I2510 Atherosclerotic heart disease of native coronary artery without angina pectoris: Secondary | ICD-10-CM | POA: Diagnosis not present

## 2015-07-05 DIAGNOSIS — R351 Nocturia: Secondary | ICD-10-CM | POA: Diagnosis not present

## 2015-07-05 DIAGNOSIS — M9711XD Periprosthetic fracture around internal prosthetic right knee joint, subsequent encounter: Secondary | ICD-10-CM | POA: Diagnosis not present

## 2015-07-05 DIAGNOSIS — I441 Atrioventricular block, second degree: Secondary | ICD-10-CM | POA: Diagnosis not present

## 2015-07-05 DIAGNOSIS — G250 Essential tremor: Secondary | ICD-10-CM | POA: Diagnosis not present

## 2015-07-05 DIAGNOSIS — I4510 Unspecified right bundle-branch block: Secondary | ICD-10-CM | POA: Diagnosis not present

## 2015-07-08 DIAGNOSIS — M9711XD Periprosthetic fracture around internal prosthetic right knee joint, subsequent encounter: Secondary | ICD-10-CM | POA: Diagnosis not present

## 2015-07-08 DIAGNOSIS — F0390 Unspecified dementia without behavioral disturbance: Secondary | ICD-10-CM | POA: Diagnosis not present

## 2015-07-08 DIAGNOSIS — I2510 Atherosclerotic heart disease of native coronary artery without angina pectoris: Secondary | ICD-10-CM | POA: Diagnosis not present

## 2015-07-08 DIAGNOSIS — I4510 Unspecified right bundle-branch block: Secondary | ICD-10-CM | POA: Diagnosis not present

## 2015-07-08 DIAGNOSIS — I441 Atrioventricular block, second degree: Secondary | ICD-10-CM | POA: Diagnosis not present

## 2015-07-08 DIAGNOSIS — G250 Essential tremor: Secondary | ICD-10-CM | POA: Diagnosis not present

## 2015-07-10 DIAGNOSIS — I441 Atrioventricular block, second degree: Secondary | ICD-10-CM | POA: Diagnosis not present

## 2015-07-10 DIAGNOSIS — G250 Essential tremor: Secondary | ICD-10-CM | POA: Diagnosis not present

## 2015-07-10 DIAGNOSIS — M9711XD Periprosthetic fracture around internal prosthetic right knee joint, subsequent encounter: Secondary | ICD-10-CM | POA: Diagnosis not present

## 2015-07-10 DIAGNOSIS — I2510 Atherosclerotic heart disease of native coronary artery without angina pectoris: Secondary | ICD-10-CM | POA: Diagnosis not present

## 2015-07-10 DIAGNOSIS — F0390 Unspecified dementia without behavioral disturbance: Secondary | ICD-10-CM | POA: Diagnosis not present

## 2015-07-10 DIAGNOSIS — I4510 Unspecified right bundle-branch block: Secondary | ICD-10-CM | POA: Diagnosis not present

## 2015-07-16 DIAGNOSIS — R351 Nocturia: Secondary | ICD-10-CM | POA: Diagnosis not present

## 2015-07-17 DIAGNOSIS — S72391D Other fracture of shaft of right femur, subsequent encounter for closed fracture with routine healing: Secondary | ICD-10-CM | POA: Diagnosis not present

## 2015-07-19 DIAGNOSIS — S72391D Other fracture of shaft of right femur, subsequent encounter for closed fracture with routine healing: Secondary | ICD-10-CM | POA: Diagnosis not present

## 2015-07-19 DIAGNOSIS — Z96652 Presence of left artificial knee joint: Secondary | ICD-10-CM | POA: Diagnosis not present

## 2015-07-19 DIAGNOSIS — M1712 Unilateral primary osteoarthritis, left knee: Secondary | ICD-10-CM | POA: Diagnosis not present

## 2015-07-19 DIAGNOSIS — Z471 Aftercare following joint replacement surgery: Secondary | ICD-10-CM | POA: Diagnosis not present

## 2015-07-22 DIAGNOSIS — S72391D Other fracture of shaft of right femur, subsequent encounter for closed fracture with routine healing: Secondary | ICD-10-CM | POA: Diagnosis not present

## 2015-07-24 DIAGNOSIS — S72391D Other fracture of shaft of right femur, subsequent encounter for closed fracture with routine healing: Secondary | ICD-10-CM | POA: Diagnosis not present

## 2015-07-29 DIAGNOSIS — S72391D Other fracture of shaft of right femur, subsequent encounter for closed fracture with routine healing: Secondary | ICD-10-CM | POA: Diagnosis not present

## 2015-08-01 DIAGNOSIS — S72391D Other fracture of shaft of right femur, subsequent encounter for closed fracture with routine healing: Secondary | ICD-10-CM | POA: Diagnosis not present

## 2015-08-05 DIAGNOSIS — I119 Hypertensive heart disease without heart failure: Secondary | ICD-10-CM | POA: Diagnosis not present

## 2015-08-05 DIAGNOSIS — Z79899 Other long term (current) drug therapy: Secondary | ICD-10-CM | POA: Diagnosis not present

## 2015-08-05 DIAGNOSIS — Z125 Encounter for screening for malignant neoplasm of prostate: Secondary | ICD-10-CM | POA: Diagnosis not present

## 2015-08-05 DIAGNOSIS — S7291XH Unspecified fracture of right femur, subsequent encounter for open fracture type I or II with delayed healing: Secondary | ICD-10-CM | POA: Diagnosis not present

## 2015-08-05 DIAGNOSIS — I2510 Atherosclerotic heart disease of native coronary artery without angina pectoris: Secondary | ICD-10-CM | POA: Diagnosis not present

## 2015-08-05 DIAGNOSIS — M25562 Pain in left knee: Secondary | ICD-10-CM | POA: Diagnosis not present

## 2015-08-05 DIAGNOSIS — R350 Frequency of micturition: Secondary | ICD-10-CM | POA: Diagnosis not present

## 2015-08-05 DIAGNOSIS — R413 Other amnesia: Secondary | ICD-10-CM | POA: Diagnosis not present

## 2015-08-05 DIAGNOSIS — M159 Polyosteoarthritis, unspecified: Secondary | ICD-10-CM | POA: Diagnosis not present

## 2015-08-05 DIAGNOSIS — E7800 Pure hypercholesterolemia, unspecified: Secondary | ICD-10-CM | POA: Diagnosis not present

## 2015-08-06 DIAGNOSIS — R350 Frequency of micturition: Secondary | ICD-10-CM | POA: Diagnosis not present

## 2015-08-06 DIAGNOSIS — S72391D Other fracture of shaft of right femur, subsequent encounter for closed fracture with routine healing: Secondary | ICD-10-CM | POA: Diagnosis not present

## 2015-08-07 ENCOUNTER — Other Ambulatory Visit (HOSPITAL_COMMUNITY): Payer: Self-pay | Admitting: Pulmonary Disease

## 2015-08-07 DIAGNOSIS — N183 Chronic kidney disease, stage 3 unspecified: Secondary | ICD-10-CM

## 2015-08-07 DIAGNOSIS — Z8551 Personal history of malignant neoplasm of bladder: Secondary | ICD-10-CM

## 2015-08-07 DIAGNOSIS — R350 Frequency of micturition: Secondary | ICD-10-CM

## 2015-08-07 DIAGNOSIS — N401 Enlarged prostate with lower urinary tract symptoms: Principal | ICD-10-CM

## 2015-08-07 DIAGNOSIS — N138 Other obstructive and reflux uropathy: Secondary | ICD-10-CM

## 2015-08-07 DIAGNOSIS — N400 Enlarged prostate without lower urinary tract symptoms: Secondary | ICD-10-CM

## 2015-08-08 DIAGNOSIS — S72391D Other fracture of shaft of right femur, subsequent encounter for closed fracture with routine healing: Secondary | ICD-10-CM | POA: Diagnosis not present

## 2015-08-09 ENCOUNTER — Ambulatory Visit (HOSPITAL_COMMUNITY)
Admission: RE | Admit: 2015-08-09 | Discharge: 2015-08-09 | Disposition: A | Discharge status: Home / Self Care | Payer: Medicare Other | Source: Ambulatory Visit | Attending: Pulmonary Disease | Admitting: Pulmonary Disease

## 2015-08-09 DIAGNOSIS — Z8551 Personal history of malignant neoplasm of bladder: Secondary | ICD-10-CM | POA: Diagnosis not present

## 2015-08-09 DIAGNOSIS — N183 Chronic kidney disease, stage 3 unspecified: Secondary | ICD-10-CM

## 2015-08-09 DIAGNOSIS — N401 Enlarged prostate with lower urinary tract symptoms: Secondary | ICD-10-CM | POA: Insufficient documentation

## 2015-08-09 DIAGNOSIS — N138 Other obstructive and reflux uropathy: Secondary | ICD-10-CM | POA: Diagnosis not present

## 2015-08-09 DIAGNOSIS — R350 Frequency of micturition: Secondary | ICD-10-CM

## 2015-08-12 DIAGNOSIS — S72391D Other fracture of shaft of right femur, subsequent encounter for closed fracture with routine healing: Secondary | ICD-10-CM | POA: Diagnosis not present

## 2015-08-14 DIAGNOSIS — S72391D Other fracture of shaft of right femur, subsequent encounter for closed fracture with routine healing: Secondary | ICD-10-CM | POA: Diagnosis not present

## 2015-08-21 DIAGNOSIS — N3941 Urge incontinence: Secondary | ICD-10-CM | POA: Diagnosis not present

## 2015-08-21 DIAGNOSIS — S72391D Other fracture of shaft of right femur, subsequent encounter for closed fracture with routine healing: Secondary | ICD-10-CM | POA: Diagnosis not present

## 2015-08-21 DIAGNOSIS — N401 Enlarged prostate with lower urinary tract symptoms: Secondary | ICD-10-CM | POA: Diagnosis not present

## 2015-08-23 DIAGNOSIS — Z471 Aftercare following joint replacement surgery: Secondary | ICD-10-CM | POA: Diagnosis not present

## 2015-08-23 DIAGNOSIS — S72391D Other fracture of shaft of right femur, subsequent encounter for closed fracture with routine healing: Secondary | ICD-10-CM | POA: Diagnosis not present

## 2015-08-23 DIAGNOSIS — Z96652 Presence of left artificial knee joint: Secondary | ICD-10-CM | POA: Diagnosis not present

## 2015-08-23 DIAGNOSIS — M1712 Unilateral primary osteoarthritis, left knee: Secondary | ICD-10-CM | POA: Diagnosis not present

## 2015-08-26 DIAGNOSIS — S72391D Other fracture of shaft of right femur, subsequent encounter for closed fracture with routine healing: Secondary | ICD-10-CM | POA: Diagnosis not present

## 2015-08-29 DIAGNOSIS — N3941 Urge incontinence: Secondary | ICD-10-CM | POA: Diagnosis not present

## 2015-08-30 DIAGNOSIS — S72391D Other fracture of shaft of right femur, subsequent encounter for closed fracture with routine healing: Secondary | ICD-10-CM | POA: Diagnosis not present

## 2015-09-03 DIAGNOSIS — S72391D Other fracture of shaft of right femur, subsequent encounter for closed fracture with routine healing: Secondary | ICD-10-CM | POA: Diagnosis not present

## 2015-09-05 DIAGNOSIS — N3941 Urge incontinence: Secondary | ICD-10-CM | POA: Diagnosis not present

## 2015-09-05 DIAGNOSIS — S72391D Other fracture of shaft of right femur, subsequent encounter for closed fracture with routine healing: Secondary | ICD-10-CM | POA: Diagnosis not present

## 2015-09-09 DIAGNOSIS — S72391D Other fracture of shaft of right femur, subsequent encounter for closed fracture with routine healing: Secondary | ICD-10-CM | POA: Diagnosis not present

## 2015-09-12 DIAGNOSIS — N3941 Urge incontinence: Secondary | ICD-10-CM | POA: Diagnosis not present

## 2015-09-13 DIAGNOSIS — S72391D Other fracture of shaft of right femur, subsequent encounter for closed fracture with routine healing: Secondary | ICD-10-CM | POA: Diagnosis not present

## 2015-09-16 DIAGNOSIS — S72391D Other fracture of shaft of right femur, subsequent encounter for closed fracture with routine healing: Secondary | ICD-10-CM | POA: Diagnosis not present

## 2015-09-19 DIAGNOSIS — S72391D Other fracture of shaft of right femur, subsequent encounter for closed fracture with routine healing: Secondary | ICD-10-CM | POA: Diagnosis not present

## 2015-09-19 DIAGNOSIS — N3941 Urge incontinence: Secondary | ICD-10-CM | POA: Diagnosis not present

## 2015-09-24 DIAGNOSIS — S72391D Other fracture of shaft of right femur, subsequent encounter for closed fracture with routine healing: Secondary | ICD-10-CM | POA: Diagnosis not present

## 2015-09-27 DIAGNOSIS — S72391D Other fracture of shaft of right femur, subsequent encounter for closed fracture with routine healing: Secondary | ICD-10-CM | POA: Diagnosis not present

## 2015-09-30 DIAGNOSIS — S72391D Other fracture of shaft of right femur, subsequent encounter for closed fracture with routine healing: Secondary | ICD-10-CM | POA: Diagnosis not present

## 2015-10-03 DIAGNOSIS — N3941 Urge incontinence: Secondary | ICD-10-CM | POA: Diagnosis not present

## 2015-10-03 DIAGNOSIS — S72391D Other fracture of shaft of right femur, subsequent encounter for closed fracture with routine healing: Secondary | ICD-10-CM | POA: Diagnosis not present

## 2015-10-08 DIAGNOSIS — S72391D Other fracture of shaft of right femur, subsequent encounter for closed fracture with routine healing: Secondary | ICD-10-CM | POA: Diagnosis not present

## 2015-10-10 DIAGNOSIS — N3941 Urge incontinence: Secondary | ICD-10-CM | POA: Diagnosis not present

## 2015-10-11 DIAGNOSIS — E785 Hyperlipidemia, unspecified: Secondary | ICD-10-CM | POA: Diagnosis not present

## 2015-10-11 DIAGNOSIS — N3281 Overactive bladder: Secondary | ICD-10-CM | POA: Diagnosis not present

## 2015-10-11 DIAGNOSIS — I2510 Atherosclerotic heart disease of native coronary artery without angina pectoris: Secondary | ICD-10-CM | POA: Diagnosis not present

## 2015-10-11 DIAGNOSIS — I10 Essential (primary) hypertension: Secondary | ICD-10-CM | POA: Diagnosis not present

## 2015-10-17 DIAGNOSIS — N3941 Urge incontinence: Secondary | ICD-10-CM | POA: Diagnosis not present

## 2015-10-18 DIAGNOSIS — I2510 Atherosclerotic heart disease of native coronary artery without angina pectoris: Secondary | ICD-10-CM | POA: Diagnosis not present

## 2015-10-18 DIAGNOSIS — I10 Essential (primary) hypertension: Secondary | ICD-10-CM | POA: Diagnosis not present

## 2015-10-18 DIAGNOSIS — E785 Hyperlipidemia, unspecified: Secondary | ICD-10-CM | POA: Diagnosis not present

## 2015-10-18 DIAGNOSIS — Z951 Presence of aortocoronary bypass graft: Secondary | ICD-10-CM | POA: Diagnosis not present

## 2015-10-22 DIAGNOSIS — S72391D Other fracture of shaft of right femur, subsequent encounter for closed fracture with routine healing: Secondary | ICD-10-CM | POA: Diagnosis not present

## 2015-10-24 DIAGNOSIS — N3941 Urge incontinence: Secondary | ICD-10-CM | POA: Diagnosis not present

## 2015-10-25 DIAGNOSIS — S72391D Other fracture of shaft of right femur, subsequent encounter for closed fracture with routine healing: Secondary | ICD-10-CM | POA: Diagnosis not present

## 2015-10-29 DIAGNOSIS — S72391D Other fracture of shaft of right femur, subsequent encounter for closed fracture with routine healing: Secondary | ICD-10-CM | POA: Diagnosis not present

## 2015-10-31 DIAGNOSIS — N3941 Urge incontinence: Secondary | ICD-10-CM | POA: Diagnosis not present

## 2015-11-01 DIAGNOSIS — S72391D Other fracture of shaft of right femur, subsequent encounter for closed fracture with routine healing: Secondary | ICD-10-CM | POA: Diagnosis not present

## 2015-11-04 DIAGNOSIS — S72391D Other fracture of shaft of right femur, subsequent encounter for closed fracture with routine healing: Secondary | ICD-10-CM | POA: Diagnosis not present

## 2015-11-07 DIAGNOSIS — N3941 Urge incontinence: Secondary | ICD-10-CM | POA: Diagnosis not present

## 2015-11-08 DIAGNOSIS — S72391D Other fracture of shaft of right femur, subsequent encounter for closed fracture with routine healing: Secondary | ICD-10-CM | POA: Diagnosis not present

## 2015-11-11 DIAGNOSIS — R011 Cardiac murmur, unspecified: Secondary | ICD-10-CM | POA: Diagnosis not present

## 2015-11-12 DIAGNOSIS — S72391D Other fracture of shaft of right femur, subsequent encounter for closed fracture with routine healing: Secondary | ICD-10-CM | POA: Diagnosis not present

## 2015-11-13 DIAGNOSIS — S72391D Other fracture of shaft of right femur, subsequent encounter for closed fracture with routine healing: Secondary | ICD-10-CM | POA: Diagnosis not present

## 2015-11-14 DIAGNOSIS — N3941 Urge incontinence: Secondary | ICD-10-CM | POA: Diagnosis not present

## 2015-11-21 DIAGNOSIS — N3941 Urge incontinence: Secondary | ICD-10-CM | POA: Diagnosis not present

## 2015-11-22 DIAGNOSIS — R490 Dysphonia: Secondary | ICD-10-CM | POA: Diagnosis not present

## 2015-11-22 DIAGNOSIS — M25512 Pain in left shoulder: Secondary | ICD-10-CM | POA: Diagnosis not present

## 2015-11-22 DIAGNOSIS — I10 Essential (primary) hypertension: Secondary | ICD-10-CM | POA: Diagnosis not present

## 2015-11-29 DIAGNOSIS — K5900 Constipation, unspecified: Secondary | ICD-10-CM | POA: Diagnosis not present

## 2015-11-29 DIAGNOSIS — K648 Other hemorrhoids: Secondary | ICD-10-CM | POA: Diagnosis not present

## 2015-12-12 DIAGNOSIS — N3941 Urge incontinence: Secondary | ICD-10-CM | POA: Diagnosis not present

## 2015-12-16 DIAGNOSIS — Z96652 Presence of left artificial knee joint: Secondary | ICD-10-CM | POA: Diagnosis not present

## 2015-12-16 DIAGNOSIS — Z471 Aftercare following joint replacement surgery: Secondary | ICD-10-CM | POA: Diagnosis not present

## 2015-12-18 DIAGNOSIS — S72391D Other fracture of shaft of right femur, subsequent encounter for closed fracture with routine healing: Secondary | ICD-10-CM | POA: Diagnosis not present

## 2016-01-02 DIAGNOSIS — N3941 Urge incontinence: Secondary | ICD-10-CM | POA: Diagnosis not present

## 2016-01-23 DIAGNOSIS — N3941 Urge incontinence: Secondary | ICD-10-CM | POA: Diagnosis not present

## 2016-01-29 DIAGNOSIS — M170 Bilateral primary osteoarthritis of knee: Secondary | ICD-10-CM | POA: Diagnosis not present

## 2016-01-29 DIAGNOSIS — K219 Gastro-esophageal reflux disease without esophagitis: Secondary | ICD-10-CM | POA: Diagnosis not present

## 2016-02-13 DIAGNOSIS — N3941 Urge incontinence: Secondary | ICD-10-CM | POA: Diagnosis not present

## 2016-02-25 DIAGNOSIS — I700 Atherosclerosis of aorta: Secondary | ICD-10-CM | POA: Diagnosis not present

## 2016-02-25 DIAGNOSIS — Z8551 Personal history of malignant neoplasm of bladder: Secondary | ICD-10-CM

## 2016-02-25 DIAGNOSIS — M810 Age-related osteoporosis without current pathological fracture: Secondary | ICD-10-CM

## 2016-02-25 DIAGNOSIS — F0150 Vascular dementia without behavioral disturbance: Secondary | ICD-10-CM | POA: Diagnosis not present

## 2016-02-25 DIAGNOSIS — M5137 Other intervertebral disc degeneration, lumbosacral region: Secondary | ICD-10-CM | POA: Diagnosis not present

## 2016-02-25 DIAGNOSIS — Z9181 History of falling: Secondary | ICD-10-CM | POA: Diagnosis not present

## 2016-02-25 HISTORY — DX: Age-related osteoporosis without current pathological fracture: M81.0

## 2016-02-25 HISTORY — DX: Personal history of malignant neoplasm of bladder: Z85.51

## 2016-03-03 DIAGNOSIS — M810 Age-related osteoporosis without current pathological fracture: Secondary | ICD-10-CM | POA: Diagnosis not present

## 2016-03-12 DIAGNOSIS — N3941 Urge incontinence: Secondary | ICD-10-CM | POA: Diagnosis not present

## 2016-03-24 DIAGNOSIS — M1990 Unspecified osteoarthritis, unspecified site: Secondary | ICD-10-CM | POA: Diagnosis not present

## 2016-03-24 DIAGNOSIS — M7542 Impingement syndrome of left shoulder: Secondary | ICD-10-CM | POA: Diagnosis not present

## 2016-03-30 DIAGNOSIS — I2510 Atherosclerotic heart disease of native coronary artery without angina pectoris: Secondary | ICD-10-CM | POA: Diagnosis not present

## 2016-03-30 DIAGNOSIS — M1712 Unilateral primary osteoarthritis, left knee: Secondary | ICD-10-CM | POA: Diagnosis not present

## 2016-03-30 DIAGNOSIS — Z96651 Presence of right artificial knee joint: Secondary | ICD-10-CM | POA: Diagnosis not present

## 2016-03-30 DIAGNOSIS — I10 Essential (primary) hypertension: Secondary | ICD-10-CM | POA: Diagnosis not present

## 2016-03-30 DIAGNOSIS — M7542 Impingement syndrome of left shoulder: Secondary | ICD-10-CM | POA: Diagnosis not present

## 2016-03-30 DIAGNOSIS — M19011 Primary osteoarthritis, right shoulder: Secondary | ICD-10-CM | POA: Diagnosis not present

## 2016-04-01 DIAGNOSIS — I2510 Atherosclerotic heart disease of native coronary artery without angina pectoris: Secondary | ICD-10-CM | POA: Diagnosis not present

## 2016-04-01 DIAGNOSIS — Z96651 Presence of right artificial knee joint: Secondary | ICD-10-CM | POA: Diagnosis not present

## 2016-04-01 DIAGNOSIS — M19011 Primary osteoarthritis, right shoulder: Secondary | ICD-10-CM | POA: Diagnosis not present

## 2016-04-01 DIAGNOSIS — M1712 Unilateral primary osteoarthritis, left knee: Secondary | ICD-10-CM | POA: Diagnosis not present

## 2016-04-01 DIAGNOSIS — M7542 Impingement syndrome of left shoulder: Secondary | ICD-10-CM | POA: Diagnosis not present

## 2016-04-01 DIAGNOSIS — I10 Essential (primary) hypertension: Secondary | ICD-10-CM | POA: Diagnosis not present

## 2016-04-02 DIAGNOSIS — I2510 Atherosclerotic heart disease of native coronary artery without angina pectoris: Secondary | ICD-10-CM | POA: Diagnosis not present

## 2016-04-02 DIAGNOSIS — M19011 Primary osteoarthritis, right shoulder: Secondary | ICD-10-CM | POA: Diagnosis not present

## 2016-04-02 DIAGNOSIS — N3941 Urge incontinence: Secondary | ICD-10-CM | POA: Diagnosis not present

## 2016-04-02 DIAGNOSIS — I10 Essential (primary) hypertension: Secondary | ICD-10-CM | POA: Diagnosis not present

## 2016-04-02 DIAGNOSIS — Z96651 Presence of right artificial knee joint: Secondary | ICD-10-CM | POA: Diagnosis not present

## 2016-04-02 DIAGNOSIS — M1712 Unilateral primary osteoarthritis, left knee: Secondary | ICD-10-CM | POA: Diagnosis not present

## 2016-04-02 DIAGNOSIS — M7542 Impingement syndrome of left shoulder: Secondary | ICD-10-CM | POA: Diagnosis not present

## 2016-04-07 DIAGNOSIS — Z96651 Presence of right artificial knee joint: Secondary | ICD-10-CM | POA: Diagnosis not present

## 2016-04-07 DIAGNOSIS — M7542 Impingement syndrome of left shoulder: Secondary | ICD-10-CM | POA: Diagnosis not present

## 2016-04-07 DIAGNOSIS — M1712 Unilateral primary osteoarthritis, left knee: Secondary | ICD-10-CM | POA: Diagnosis not present

## 2016-04-07 DIAGNOSIS — I2510 Atherosclerotic heart disease of native coronary artery without angina pectoris: Secondary | ICD-10-CM | POA: Diagnosis not present

## 2016-04-07 DIAGNOSIS — I10 Essential (primary) hypertension: Secondary | ICD-10-CM | POA: Diagnosis not present

## 2016-04-07 DIAGNOSIS — M19011 Primary osteoarthritis, right shoulder: Secondary | ICD-10-CM | POA: Diagnosis not present

## 2016-04-08 DIAGNOSIS — M7542 Impingement syndrome of left shoulder: Secondary | ICD-10-CM | POA: Diagnosis not present

## 2016-04-08 DIAGNOSIS — M19011 Primary osteoarthritis, right shoulder: Secondary | ICD-10-CM | POA: Diagnosis not present

## 2016-04-08 DIAGNOSIS — M1712 Unilateral primary osteoarthritis, left knee: Secondary | ICD-10-CM | POA: Diagnosis not present

## 2016-04-08 DIAGNOSIS — I10 Essential (primary) hypertension: Secondary | ICD-10-CM | POA: Diagnosis not present

## 2016-04-08 DIAGNOSIS — Z96651 Presence of right artificial knee joint: Secondary | ICD-10-CM | POA: Diagnosis not present

## 2016-04-08 DIAGNOSIS — I2510 Atherosclerotic heart disease of native coronary artery without angina pectoris: Secondary | ICD-10-CM | POA: Diagnosis not present

## 2016-04-09 DIAGNOSIS — I2510 Atherosclerotic heart disease of native coronary artery without angina pectoris: Secondary | ICD-10-CM | POA: Diagnosis not present

## 2016-04-09 DIAGNOSIS — M1712 Unilateral primary osteoarthritis, left knee: Secondary | ICD-10-CM | POA: Diagnosis not present

## 2016-04-09 DIAGNOSIS — M7542 Impingement syndrome of left shoulder: Secondary | ICD-10-CM | POA: Diagnosis not present

## 2016-04-09 DIAGNOSIS — Z96651 Presence of right artificial knee joint: Secondary | ICD-10-CM | POA: Diagnosis not present

## 2016-04-09 DIAGNOSIS — I10 Essential (primary) hypertension: Secondary | ICD-10-CM | POA: Diagnosis not present

## 2016-04-09 DIAGNOSIS — M19011 Primary osteoarthritis, right shoulder: Secondary | ICD-10-CM | POA: Diagnosis not present

## 2016-04-13 DIAGNOSIS — M7542 Impingement syndrome of left shoulder: Secondary | ICD-10-CM | POA: Diagnosis not present

## 2016-04-13 DIAGNOSIS — Z96651 Presence of right artificial knee joint: Secondary | ICD-10-CM | POA: Diagnosis not present

## 2016-04-13 DIAGNOSIS — M1712 Unilateral primary osteoarthritis, left knee: Secondary | ICD-10-CM | POA: Diagnosis not present

## 2016-04-13 DIAGNOSIS — M19011 Primary osteoarthritis, right shoulder: Secondary | ICD-10-CM | POA: Diagnosis not present

## 2016-04-13 DIAGNOSIS — I2510 Atherosclerotic heart disease of native coronary artery without angina pectoris: Secondary | ICD-10-CM | POA: Diagnosis not present

## 2016-04-13 DIAGNOSIS — I10 Essential (primary) hypertension: Secondary | ICD-10-CM | POA: Diagnosis not present

## 2016-04-15 DIAGNOSIS — M7542 Impingement syndrome of left shoulder: Secondary | ICD-10-CM | POA: Diagnosis not present

## 2016-04-15 DIAGNOSIS — M1712 Unilateral primary osteoarthritis, left knee: Secondary | ICD-10-CM | POA: Diagnosis not present

## 2016-04-15 DIAGNOSIS — Z96651 Presence of right artificial knee joint: Secondary | ICD-10-CM | POA: Diagnosis not present

## 2016-04-15 DIAGNOSIS — M19011 Primary osteoarthritis, right shoulder: Secondary | ICD-10-CM | POA: Diagnosis not present

## 2016-04-15 DIAGNOSIS — I10 Essential (primary) hypertension: Secondary | ICD-10-CM | POA: Diagnosis not present

## 2016-04-15 DIAGNOSIS — I2510 Atherosclerotic heart disease of native coronary artery without angina pectoris: Secondary | ICD-10-CM | POA: Diagnosis not present

## 2016-04-16 DIAGNOSIS — M7542 Impingement syndrome of left shoulder: Secondary | ICD-10-CM | POA: Diagnosis not present

## 2016-04-16 DIAGNOSIS — M19011 Primary osteoarthritis, right shoulder: Secondary | ICD-10-CM | POA: Diagnosis not present

## 2016-04-16 DIAGNOSIS — Z96651 Presence of right artificial knee joint: Secondary | ICD-10-CM | POA: Diagnosis not present

## 2016-04-16 DIAGNOSIS — I10 Essential (primary) hypertension: Secondary | ICD-10-CM | POA: Diagnosis not present

## 2016-04-16 DIAGNOSIS — M1712 Unilateral primary osteoarthritis, left knee: Secondary | ICD-10-CM | POA: Diagnosis not present

## 2016-04-16 DIAGNOSIS — I2510 Atherosclerotic heart disease of native coronary artery without angina pectoris: Secondary | ICD-10-CM | POA: Diagnosis not present

## 2016-04-17 DIAGNOSIS — I350 Nonrheumatic aortic (valve) stenosis: Secondary | ICD-10-CM | POA: Diagnosis not present

## 2016-04-17 DIAGNOSIS — I119 Hypertensive heart disease without heart failure: Secondary | ICD-10-CM | POA: Diagnosis not present

## 2016-04-17 DIAGNOSIS — I2510 Atherosclerotic heart disease of native coronary artery without angina pectoris: Secondary | ICD-10-CM | POA: Diagnosis not present

## 2016-04-17 DIAGNOSIS — E785 Hyperlipidemia, unspecified: Secondary | ICD-10-CM | POA: Diagnosis not present

## 2016-04-17 DIAGNOSIS — I10 Essential (primary) hypertension: Secondary | ICD-10-CM | POA: Diagnosis not present

## 2016-04-17 DIAGNOSIS — Z951 Presence of aortocoronary bypass graft: Secondary | ICD-10-CM | POA: Diagnosis not present

## 2016-04-20 DIAGNOSIS — Z96651 Presence of right artificial knee joint: Secondary | ICD-10-CM | POA: Diagnosis not present

## 2016-04-20 DIAGNOSIS — I2510 Atherosclerotic heart disease of native coronary artery without angina pectoris: Secondary | ICD-10-CM | POA: Diagnosis not present

## 2016-04-20 DIAGNOSIS — M7542 Impingement syndrome of left shoulder: Secondary | ICD-10-CM | POA: Diagnosis not present

## 2016-04-20 DIAGNOSIS — M19011 Primary osteoarthritis, right shoulder: Secondary | ICD-10-CM | POA: Diagnosis not present

## 2016-04-20 DIAGNOSIS — I10 Essential (primary) hypertension: Secondary | ICD-10-CM | POA: Diagnosis not present

## 2016-04-20 DIAGNOSIS — M1712 Unilateral primary osteoarthritis, left knee: Secondary | ICD-10-CM | POA: Diagnosis not present

## 2016-04-21 DIAGNOSIS — M7542 Impingement syndrome of left shoulder: Secondary | ICD-10-CM | POA: Diagnosis not present

## 2016-04-21 DIAGNOSIS — M19011 Primary osteoarthritis, right shoulder: Secondary | ICD-10-CM | POA: Diagnosis not present

## 2016-04-21 DIAGNOSIS — M1712 Unilateral primary osteoarthritis, left knee: Secondary | ICD-10-CM | POA: Diagnosis not present

## 2016-04-21 DIAGNOSIS — I2510 Atherosclerotic heart disease of native coronary artery without angina pectoris: Secondary | ICD-10-CM | POA: Diagnosis not present

## 2016-04-21 DIAGNOSIS — Z96651 Presence of right artificial knee joint: Secondary | ICD-10-CM | POA: Diagnosis not present

## 2016-04-21 DIAGNOSIS — I10 Essential (primary) hypertension: Secondary | ICD-10-CM | POA: Diagnosis not present

## 2016-04-28 DIAGNOSIS — M1712 Unilateral primary osteoarthritis, left knee: Secondary | ICD-10-CM | POA: Diagnosis not present

## 2016-04-28 DIAGNOSIS — M19011 Primary osteoarthritis, right shoulder: Secondary | ICD-10-CM | POA: Diagnosis not present

## 2016-04-28 DIAGNOSIS — M7542 Impingement syndrome of left shoulder: Secondary | ICD-10-CM | POA: Diagnosis not present

## 2016-04-28 DIAGNOSIS — I2510 Atherosclerotic heart disease of native coronary artery without angina pectoris: Secondary | ICD-10-CM | POA: Diagnosis not present

## 2016-04-28 DIAGNOSIS — I10 Essential (primary) hypertension: Secondary | ICD-10-CM | POA: Diagnosis not present

## 2016-04-28 DIAGNOSIS — Z96651 Presence of right artificial knee joint: Secondary | ICD-10-CM | POA: Diagnosis not present

## 2016-04-30 DIAGNOSIS — I2510 Atherosclerotic heart disease of native coronary artery without angina pectoris: Secondary | ICD-10-CM | POA: Diagnosis not present

## 2016-04-30 DIAGNOSIS — M1712 Unilateral primary osteoarthritis, left knee: Secondary | ICD-10-CM | POA: Diagnosis not present

## 2016-04-30 DIAGNOSIS — I10 Essential (primary) hypertension: Secondary | ICD-10-CM | POA: Diagnosis not present

## 2016-04-30 DIAGNOSIS — M7542 Impingement syndrome of left shoulder: Secondary | ICD-10-CM | POA: Diagnosis not present

## 2016-04-30 DIAGNOSIS — Z96651 Presence of right artificial knee joint: Secondary | ICD-10-CM | POA: Diagnosis not present

## 2016-04-30 DIAGNOSIS — M19011 Primary osteoarthritis, right shoulder: Secondary | ICD-10-CM | POA: Diagnosis not present

## 2016-05-04 DIAGNOSIS — M19011 Primary osteoarthritis, right shoulder: Secondary | ICD-10-CM | POA: Diagnosis not present

## 2016-05-04 DIAGNOSIS — Z96651 Presence of right artificial knee joint: Secondary | ICD-10-CM | POA: Diagnosis not present

## 2016-05-04 DIAGNOSIS — M7542 Impingement syndrome of left shoulder: Secondary | ICD-10-CM | POA: Diagnosis not present

## 2016-05-04 DIAGNOSIS — I10 Essential (primary) hypertension: Secondary | ICD-10-CM | POA: Diagnosis not present

## 2016-05-04 DIAGNOSIS — M1712 Unilateral primary osteoarthritis, left knee: Secondary | ICD-10-CM | POA: Diagnosis not present

## 2016-05-04 DIAGNOSIS — I2510 Atherosclerotic heart disease of native coronary artery without angina pectoris: Secondary | ICD-10-CM | POA: Diagnosis not present

## 2016-05-06 DIAGNOSIS — Z96651 Presence of right artificial knee joint: Secondary | ICD-10-CM | POA: Diagnosis not present

## 2016-05-06 DIAGNOSIS — M19011 Primary osteoarthritis, right shoulder: Secondary | ICD-10-CM | POA: Diagnosis not present

## 2016-05-06 DIAGNOSIS — M1712 Unilateral primary osteoarthritis, left knee: Secondary | ICD-10-CM | POA: Diagnosis not present

## 2016-05-06 DIAGNOSIS — I2510 Atherosclerotic heart disease of native coronary artery without angina pectoris: Secondary | ICD-10-CM | POA: Diagnosis not present

## 2016-05-06 DIAGNOSIS — I10 Essential (primary) hypertension: Secondary | ICD-10-CM | POA: Diagnosis not present

## 2016-05-06 DIAGNOSIS — M7542 Impingement syndrome of left shoulder: Secondary | ICD-10-CM | POA: Diagnosis not present

## 2016-05-07 DIAGNOSIS — Z96651 Presence of right artificial knee joint: Secondary | ICD-10-CM | POA: Diagnosis not present

## 2016-05-07 DIAGNOSIS — I2510 Atherosclerotic heart disease of native coronary artery without angina pectoris: Secondary | ICD-10-CM | POA: Diagnosis not present

## 2016-05-07 DIAGNOSIS — M1712 Unilateral primary osteoarthritis, left knee: Secondary | ICD-10-CM | POA: Diagnosis not present

## 2016-05-07 DIAGNOSIS — I10 Essential (primary) hypertension: Secondary | ICD-10-CM | POA: Diagnosis not present

## 2016-05-07 DIAGNOSIS — M7542 Impingement syndrome of left shoulder: Secondary | ICD-10-CM | POA: Diagnosis not present

## 2016-05-07 DIAGNOSIS — M19011 Primary osteoarthritis, right shoulder: Secondary | ICD-10-CM | POA: Diagnosis not present

## 2016-05-11 DIAGNOSIS — M7542 Impingement syndrome of left shoulder: Secondary | ICD-10-CM | POA: Diagnosis not present

## 2016-05-11 DIAGNOSIS — M19011 Primary osteoarthritis, right shoulder: Secondary | ICD-10-CM | POA: Diagnosis not present

## 2016-05-11 DIAGNOSIS — I10 Essential (primary) hypertension: Secondary | ICD-10-CM | POA: Diagnosis not present

## 2016-05-11 DIAGNOSIS — Z96651 Presence of right artificial knee joint: Secondary | ICD-10-CM | POA: Diagnosis not present

## 2016-05-11 DIAGNOSIS — M1712 Unilateral primary osteoarthritis, left knee: Secondary | ICD-10-CM | POA: Diagnosis not present

## 2016-05-11 DIAGNOSIS — I2510 Atherosclerotic heart disease of native coronary artery without angina pectoris: Secondary | ICD-10-CM | POA: Diagnosis not present

## 2016-05-12 DIAGNOSIS — R490 Dysphonia: Secondary | ICD-10-CM | POA: Diagnosis not present

## 2016-05-12 DIAGNOSIS — Z96651 Presence of right artificial knee joint: Secondary | ICD-10-CM | POA: Diagnosis not present

## 2016-05-12 DIAGNOSIS — M1712 Unilateral primary osteoarthritis, left knee: Secondary | ICD-10-CM | POA: Diagnosis not present

## 2016-05-12 DIAGNOSIS — I2510 Atherosclerotic heart disease of native coronary artery without angina pectoris: Secondary | ICD-10-CM | POA: Diagnosis not present

## 2016-05-12 DIAGNOSIS — M7542 Impingement syndrome of left shoulder: Secondary | ICD-10-CM | POA: Diagnosis not present

## 2016-05-12 DIAGNOSIS — M19011 Primary osteoarthritis, right shoulder: Secondary | ICD-10-CM | POA: Diagnosis not present

## 2016-05-12 DIAGNOSIS — I10 Essential (primary) hypertension: Secondary | ICD-10-CM | POA: Diagnosis not present

## 2016-05-13 DIAGNOSIS — I10 Essential (primary) hypertension: Secondary | ICD-10-CM | POA: Diagnosis not present

## 2016-05-13 DIAGNOSIS — Z96651 Presence of right artificial knee joint: Secondary | ICD-10-CM | POA: Diagnosis not present

## 2016-05-13 DIAGNOSIS — M1712 Unilateral primary osteoarthritis, left knee: Secondary | ICD-10-CM | POA: Diagnosis not present

## 2016-05-13 DIAGNOSIS — M7542 Impingement syndrome of left shoulder: Secondary | ICD-10-CM | POA: Diagnosis not present

## 2016-05-13 DIAGNOSIS — I2510 Atherosclerotic heart disease of native coronary artery without angina pectoris: Secondary | ICD-10-CM | POA: Diagnosis not present

## 2016-05-13 DIAGNOSIS — M19011 Primary osteoarthritis, right shoulder: Secondary | ICD-10-CM | POA: Diagnosis not present

## 2016-05-14 DIAGNOSIS — N3941 Urge incontinence: Secondary | ICD-10-CM | POA: Diagnosis not present

## 2016-05-14 DIAGNOSIS — I10 Essential (primary) hypertension: Secondary | ICD-10-CM | POA: Diagnosis not present

## 2016-05-14 DIAGNOSIS — M19011 Primary osteoarthritis, right shoulder: Secondary | ICD-10-CM | POA: Diagnosis not present

## 2016-05-14 DIAGNOSIS — Z96651 Presence of right artificial knee joint: Secondary | ICD-10-CM | POA: Diagnosis not present

## 2016-05-14 DIAGNOSIS — M1712 Unilateral primary osteoarthritis, left knee: Secondary | ICD-10-CM | POA: Diagnosis not present

## 2016-05-14 DIAGNOSIS — I2510 Atherosclerotic heart disease of native coronary artery without angina pectoris: Secondary | ICD-10-CM | POA: Diagnosis not present

## 2016-05-14 DIAGNOSIS — M7542 Impingement syndrome of left shoulder: Secondary | ICD-10-CM | POA: Diagnosis not present

## 2016-05-18 DIAGNOSIS — I2510 Atherosclerotic heart disease of native coronary artery without angina pectoris: Secondary | ICD-10-CM | POA: Diagnosis not present

## 2016-05-18 DIAGNOSIS — M7542 Impingement syndrome of left shoulder: Secondary | ICD-10-CM | POA: Diagnosis not present

## 2016-05-18 DIAGNOSIS — M1712 Unilateral primary osteoarthritis, left knee: Secondary | ICD-10-CM | POA: Diagnosis not present

## 2016-05-18 DIAGNOSIS — M19011 Primary osteoarthritis, right shoulder: Secondary | ICD-10-CM | POA: Diagnosis not present

## 2016-05-18 DIAGNOSIS — H2513 Age-related nuclear cataract, bilateral: Secondary | ICD-10-CM | POA: Diagnosis not present

## 2016-05-18 DIAGNOSIS — I10 Essential (primary) hypertension: Secondary | ICD-10-CM | POA: Diagnosis not present

## 2016-05-18 DIAGNOSIS — Z96651 Presence of right artificial knee joint: Secondary | ICD-10-CM | POA: Diagnosis not present

## 2016-05-20 DIAGNOSIS — Z96651 Presence of right artificial knee joint: Secondary | ICD-10-CM | POA: Diagnosis not present

## 2016-05-20 DIAGNOSIS — M1712 Unilateral primary osteoarthritis, left knee: Secondary | ICD-10-CM | POA: Diagnosis not present

## 2016-05-20 DIAGNOSIS — M19011 Primary osteoarthritis, right shoulder: Secondary | ICD-10-CM | POA: Diagnosis not present

## 2016-05-20 DIAGNOSIS — I2510 Atherosclerotic heart disease of native coronary artery without angina pectoris: Secondary | ICD-10-CM | POA: Diagnosis not present

## 2016-05-20 DIAGNOSIS — I10 Essential (primary) hypertension: Secondary | ICD-10-CM | POA: Diagnosis not present

## 2016-05-20 DIAGNOSIS — M7542 Impingement syndrome of left shoulder: Secondary | ICD-10-CM | POA: Diagnosis not present

## 2016-05-25 DIAGNOSIS — I10 Essential (primary) hypertension: Secondary | ICD-10-CM | POA: Diagnosis not present

## 2016-05-25 DIAGNOSIS — M1712 Unilateral primary osteoarthritis, left knee: Secondary | ICD-10-CM | POA: Diagnosis not present

## 2016-05-25 DIAGNOSIS — M19011 Primary osteoarthritis, right shoulder: Secondary | ICD-10-CM | POA: Diagnosis not present

## 2016-05-25 DIAGNOSIS — I2510 Atherosclerotic heart disease of native coronary artery without angina pectoris: Secondary | ICD-10-CM | POA: Diagnosis not present

## 2016-05-25 DIAGNOSIS — Z96651 Presence of right artificial knee joint: Secondary | ICD-10-CM | POA: Diagnosis not present

## 2016-05-25 DIAGNOSIS — M7542 Impingement syndrome of left shoulder: Secondary | ICD-10-CM | POA: Diagnosis not present

## 2016-05-26 DIAGNOSIS — M1712 Unilateral primary osteoarthritis, left knee: Secondary | ICD-10-CM | POA: Diagnosis not present

## 2016-05-26 DIAGNOSIS — I2510 Atherosclerotic heart disease of native coronary artery without angina pectoris: Secondary | ICD-10-CM | POA: Diagnosis not present

## 2016-05-26 DIAGNOSIS — Z96651 Presence of right artificial knee joint: Secondary | ICD-10-CM | POA: Diagnosis not present

## 2016-05-26 DIAGNOSIS — M7542 Impingement syndrome of left shoulder: Secondary | ICD-10-CM | POA: Diagnosis not present

## 2016-05-26 DIAGNOSIS — M19011 Primary osteoarthritis, right shoulder: Secondary | ICD-10-CM | POA: Diagnosis not present

## 2016-05-26 DIAGNOSIS — I10 Essential (primary) hypertension: Secondary | ICD-10-CM | POA: Diagnosis not present

## 2016-05-27 DIAGNOSIS — I2510 Atherosclerotic heart disease of native coronary artery without angina pectoris: Secondary | ICD-10-CM | POA: Diagnosis not present

## 2016-05-27 DIAGNOSIS — I10 Essential (primary) hypertension: Secondary | ICD-10-CM | POA: Diagnosis not present

## 2016-05-27 DIAGNOSIS — Z96651 Presence of right artificial knee joint: Secondary | ICD-10-CM | POA: Diagnosis not present

## 2016-05-27 DIAGNOSIS — M7542 Impingement syndrome of left shoulder: Secondary | ICD-10-CM | POA: Diagnosis not present

## 2016-05-27 DIAGNOSIS — M1712 Unilateral primary osteoarthritis, left knee: Secondary | ICD-10-CM | POA: Diagnosis not present

## 2016-05-27 DIAGNOSIS — M19011 Primary osteoarthritis, right shoulder: Secondary | ICD-10-CM | POA: Diagnosis not present

## 2016-05-28 DIAGNOSIS — J300 Vasomotor rhinitis: Secondary | ICD-10-CM | POA: Diagnosis not present

## 2016-05-28 DIAGNOSIS — K219 Gastro-esophageal reflux disease without esophagitis: Secondary | ICD-10-CM

## 2016-05-28 DIAGNOSIS — J383 Other diseases of vocal cords: Secondary | ICD-10-CM | POA: Diagnosis not present

## 2016-05-28 HISTORY — DX: Gastro-esophageal reflux disease without esophagitis: K21.9

## 2016-05-29 DIAGNOSIS — I10 Essential (primary) hypertension: Secondary | ICD-10-CM | POA: Diagnosis not present

## 2016-05-29 DIAGNOSIS — I2510 Atherosclerotic heart disease of native coronary artery without angina pectoris: Secondary | ICD-10-CM | POA: Diagnosis not present

## 2016-05-29 DIAGNOSIS — M1712 Unilateral primary osteoarthritis, left knee: Secondary | ICD-10-CM | POA: Diagnosis not present

## 2016-05-29 DIAGNOSIS — M7542 Impingement syndrome of left shoulder: Secondary | ICD-10-CM | POA: Diagnosis not present

## 2016-05-29 DIAGNOSIS — Z96651 Presence of right artificial knee joint: Secondary | ICD-10-CM | POA: Diagnosis not present

## 2016-05-29 DIAGNOSIS — M19011 Primary osteoarthritis, right shoulder: Secondary | ICD-10-CM | POA: Diagnosis not present

## 2016-06-01 DIAGNOSIS — I2510 Atherosclerotic heart disease of native coronary artery without angina pectoris: Secondary | ICD-10-CM | POA: Diagnosis not present

## 2016-06-01 DIAGNOSIS — M19011 Primary osteoarthritis, right shoulder: Secondary | ICD-10-CM | POA: Diagnosis not present

## 2016-06-01 DIAGNOSIS — M7542 Impingement syndrome of left shoulder: Secondary | ICD-10-CM | POA: Diagnosis not present

## 2016-06-01 DIAGNOSIS — Z96651 Presence of right artificial knee joint: Secondary | ICD-10-CM | POA: Diagnosis not present

## 2016-06-01 DIAGNOSIS — I10 Essential (primary) hypertension: Secondary | ICD-10-CM | POA: Diagnosis not present

## 2016-06-01 DIAGNOSIS — M1712 Unilateral primary osteoarthritis, left knee: Secondary | ICD-10-CM | POA: Diagnosis not present

## 2016-06-03 DIAGNOSIS — M7542 Impingement syndrome of left shoulder: Secondary | ICD-10-CM | POA: Diagnosis not present

## 2016-06-03 DIAGNOSIS — I10 Essential (primary) hypertension: Secondary | ICD-10-CM | POA: Diagnosis not present

## 2016-06-03 DIAGNOSIS — M19011 Primary osteoarthritis, right shoulder: Secondary | ICD-10-CM | POA: Diagnosis not present

## 2016-06-03 DIAGNOSIS — M1712 Unilateral primary osteoarthritis, left knee: Secondary | ICD-10-CM | POA: Diagnosis not present

## 2016-06-03 DIAGNOSIS — Z96651 Presence of right artificial knee joint: Secondary | ICD-10-CM | POA: Diagnosis not present

## 2016-06-03 DIAGNOSIS — I2510 Atherosclerotic heart disease of native coronary artery without angina pectoris: Secondary | ICD-10-CM | POA: Diagnosis not present

## 2016-06-04 DIAGNOSIS — N3941 Urge incontinence: Secondary | ICD-10-CM | POA: Diagnosis not present

## 2016-06-05 DIAGNOSIS — M19011 Primary osteoarthritis, right shoulder: Secondary | ICD-10-CM | POA: Diagnosis not present

## 2016-06-05 DIAGNOSIS — I2510 Atherosclerotic heart disease of native coronary artery without angina pectoris: Secondary | ICD-10-CM | POA: Diagnosis not present

## 2016-06-05 DIAGNOSIS — Z96651 Presence of right artificial knee joint: Secondary | ICD-10-CM | POA: Diagnosis not present

## 2016-06-05 DIAGNOSIS — I10 Essential (primary) hypertension: Secondary | ICD-10-CM | POA: Diagnosis not present

## 2016-06-05 DIAGNOSIS — M7542 Impingement syndrome of left shoulder: Secondary | ICD-10-CM | POA: Diagnosis not present

## 2016-06-05 DIAGNOSIS — M1712 Unilateral primary osteoarthritis, left knee: Secondary | ICD-10-CM | POA: Diagnosis not present

## 2016-06-08 DIAGNOSIS — I2510 Atherosclerotic heart disease of native coronary artery without angina pectoris: Secondary | ICD-10-CM | POA: Diagnosis not present

## 2016-06-08 DIAGNOSIS — M7542 Impingement syndrome of left shoulder: Secondary | ICD-10-CM | POA: Diagnosis not present

## 2016-06-08 DIAGNOSIS — I10 Essential (primary) hypertension: Secondary | ICD-10-CM | POA: Diagnosis not present

## 2016-06-08 DIAGNOSIS — M1712 Unilateral primary osteoarthritis, left knee: Secondary | ICD-10-CM | POA: Diagnosis not present

## 2016-06-08 DIAGNOSIS — M19011 Primary osteoarthritis, right shoulder: Secondary | ICD-10-CM | POA: Diagnosis not present

## 2016-06-08 DIAGNOSIS — Z96651 Presence of right artificial knee joint: Secondary | ICD-10-CM | POA: Diagnosis not present

## 2016-06-10 DIAGNOSIS — M1712 Unilateral primary osteoarthritis, left knee: Secondary | ICD-10-CM | POA: Diagnosis not present

## 2016-06-10 DIAGNOSIS — M7542 Impingement syndrome of left shoulder: Secondary | ICD-10-CM | POA: Diagnosis not present

## 2016-06-10 DIAGNOSIS — M19011 Primary osteoarthritis, right shoulder: Secondary | ICD-10-CM | POA: Diagnosis not present

## 2016-06-10 DIAGNOSIS — I2510 Atherosclerotic heart disease of native coronary artery without angina pectoris: Secondary | ICD-10-CM | POA: Diagnosis not present

## 2016-06-10 DIAGNOSIS — Z96651 Presence of right artificial knee joint: Secondary | ICD-10-CM | POA: Diagnosis not present

## 2016-06-10 DIAGNOSIS — I10 Essential (primary) hypertension: Secondary | ICD-10-CM | POA: Diagnosis not present

## 2016-06-11 DIAGNOSIS — M19011 Primary osteoarthritis, right shoulder: Secondary | ICD-10-CM | POA: Diagnosis not present

## 2016-06-11 DIAGNOSIS — I10 Essential (primary) hypertension: Secondary | ICD-10-CM | POA: Diagnosis not present

## 2016-06-11 DIAGNOSIS — M1712 Unilateral primary osteoarthritis, left knee: Secondary | ICD-10-CM | POA: Diagnosis not present

## 2016-06-11 DIAGNOSIS — Z96651 Presence of right artificial knee joint: Secondary | ICD-10-CM | POA: Diagnosis not present

## 2016-06-11 DIAGNOSIS — M7542 Impingement syndrome of left shoulder: Secondary | ICD-10-CM | POA: Diagnosis not present

## 2016-06-11 DIAGNOSIS — I2510 Atherosclerotic heart disease of native coronary artery without angina pectoris: Secondary | ICD-10-CM | POA: Diagnosis not present

## 2016-06-15 DIAGNOSIS — M7542 Impingement syndrome of left shoulder: Secondary | ICD-10-CM | POA: Diagnosis not present

## 2016-06-15 DIAGNOSIS — M19011 Primary osteoarthritis, right shoulder: Secondary | ICD-10-CM | POA: Diagnosis not present

## 2016-06-15 DIAGNOSIS — I2510 Atherosclerotic heart disease of native coronary artery without angina pectoris: Secondary | ICD-10-CM | POA: Diagnosis not present

## 2016-06-15 DIAGNOSIS — M1712 Unilateral primary osteoarthritis, left knee: Secondary | ICD-10-CM | POA: Diagnosis not present

## 2016-06-15 DIAGNOSIS — Z96651 Presence of right artificial knee joint: Secondary | ICD-10-CM | POA: Diagnosis not present

## 2016-06-15 DIAGNOSIS — I10 Essential (primary) hypertension: Secondary | ICD-10-CM | POA: Diagnosis not present

## 2016-06-16 DIAGNOSIS — R262 Difficulty in walking, not elsewhere classified: Secondary | ICD-10-CM

## 2016-06-16 HISTORY — DX: Difficulty in walking, not elsewhere classified: R26.2

## 2016-06-16 HISTORY — PX: FRACTURE SURGERY: SHX138

## 2016-06-17 DIAGNOSIS — M1712 Unilateral primary osteoarthritis, left knee: Secondary | ICD-10-CM | POA: Diagnosis not present

## 2016-06-17 DIAGNOSIS — Z96651 Presence of right artificial knee joint: Secondary | ICD-10-CM | POA: Diagnosis not present

## 2016-06-17 DIAGNOSIS — I10 Essential (primary) hypertension: Secondary | ICD-10-CM | POA: Diagnosis not present

## 2016-06-17 DIAGNOSIS — I2510 Atherosclerotic heart disease of native coronary artery without angina pectoris: Secondary | ICD-10-CM | POA: Diagnosis not present

## 2016-06-17 DIAGNOSIS — M19011 Primary osteoarthritis, right shoulder: Secondary | ICD-10-CM | POA: Diagnosis not present

## 2016-06-17 DIAGNOSIS — M7542 Impingement syndrome of left shoulder: Secondary | ICD-10-CM | POA: Diagnosis not present

## 2016-06-18 DIAGNOSIS — M19011 Primary osteoarthritis, right shoulder: Secondary | ICD-10-CM | POA: Diagnosis not present

## 2016-06-18 DIAGNOSIS — I10 Essential (primary) hypertension: Secondary | ICD-10-CM | POA: Diagnosis not present

## 2016-06-18 DIAGNOSIS — Z96651 Presence of right artificial knee joint: Secondary | ICD-10-CM | POA: Diagnosis not present

## 2016-06-18 DIAGNOSIS — M7542 Impingement syndrome of left shoulder: Secondary | ICD-10-CM | POA: Diagnosis not present

## 2016-06-18 DIAGNOSIS — M1712 Unilateral primary osteoarthritis, left knee: Secondary | ICD-10-CM | POA: Diagnosis not present

## 2016-06-18 DIAGNOSIS — I2510 Atherosclerotic heart disease of native coronary artery without angina pectoris: Secondary | ICD-10-CM | POA: Diagnosis not present

## 2016-06-19 DIAGNOSIS — S72391D Other fracture of shaft of right femur, subsequent encounter for closed fracture with routine healing: Secondary | ICD-10-CM | POA: Diagnosis not present

## 2016-06-22 DIAGNOSIS — M1712 Unilateral primary osteoarthritis, left knee: Secondary | ICD-10-CM | POA: Diagnosis not present

## 2016-06-22 DIAGNOSIS — I10 Essential (primary) hypertension: Secondary | ICD-10-CM | POA: Diagnosis not present

## 2016-06-22 DIAGNOSIS — Z96651 Presence of right artificial knee joint: Secondary | ICD-10-CM | POA: Diagnosis not present

## 2016-06-22 DIAGNOSIS — M7542 Impingement syndrome of left shoulder: Secondary | ICD-10-CM | POA: Diagnosis not present

## 2016-06-22 DIAGNOSIS — I2510 Atherosclerotic heart disease of native coronary artery without angina pectoris: Secondary | ICD-10-CM | POA: Diagnosis not present

## 2016-06-22 DIAGNOSIS — M19011 Primary osteoarthritis, right shoulder: Secondary | ICD-10-CM | POA: Diagnosis not present

## 2016-06-24 DIAGNOSIS — M19011 Primary osteoarthritis, right shoulder: Secondary | ICD-10-CM | POA: Diagnosis not present

## 2016-06-24 DIAGNOSIS — Z96651 Presence of right artificial knee joint: Secondary | ICD-10-CM | POA: Diagnosis not present

## 2016-06-24 DIAGNOSIS — I2510 Atherosclerotic heart disease of native coronary artery without angina pectoris: Secondary | ICD-10-CM | POA: Diagnosis not present

## 2016-06-24 DIAGNOSIS — I10 Essential (primary) hypertension: Secondary | ICD-10-CM | POA: Diagnosis not present

## 2016-06-24 DIAGNOSIS — M1712 Unilateral primary osteoarthritis, left knee: Secondary | ICD-10-CM | POA: Diagnosis not present

## 2016-06-24 DIAGNOSIS — M7542 Impingement syndrome of left shoulder: Secondary | ICD-10-CM | POA: Diagnosis not present

## 2016-06-25 DIAGNOSIS — N3941 Urge incontinence: Secondary | ICD-10-CM | POA: Diagnosis not present

## 2016-07-23 DIAGNOSIS — Z634 Disappearance and death of family member: Secondary | ICD-10-CM | POA: Diagnosis not present

## 2016-07-23 DIAGNOSIS — E785 Hyperlipidemia, unspecified: Secondary | ICD-10-CM | POA: Diagnosis not present

## 2016-07-23 DIAGNOSIS — F0150 Vascular dementia without behavioral disturbance: Secondary | ICD-10-CM | POA: Diagnosis not present

## 2016-07-23 DIAGNOSIS — N3281 Overactive bladder: Secondary | ICD-10-CM | POA: Diagnosis not present

## 2016-07-23 DIAGNOSIS — K219 Gastro-esophageal reflux disease without esophagitis: Secondary | ICD-10-CM | POA: Diagnosis not present

## 2016-07-23 DIAGNOSIS — I10 Essential (primary) hypertension: Secondary | ICD-10-CM | POA: Diagnosis not present

## 2016-08-06 DIAGNOSIS — M545 Low back pain: Secondary | ICD-10-CM | POA: Diagnosis not present

## 2016-08-25 DIAGNOSIS — N401 Enlarged prostate with lower urinary tract symptoms: Secondary | ICD-10-CM | POA: Diagnosis not present

## 2016-08-25 DIAGNOSIS — R3915 Urgency of urination: Secondary | ICD-10-CM | POA: Diagnosis not present

## 2016-09-02 DIAGNOSIS — R3915 Urgency of urination: Secondary | ICD-10-CM | POA: Diagnosis not present

## 2016-09-02 DIAGNOSIS — N401 Enlarged prostate with lower urinary tract symptoms: Secondary | ICD-10-CM | POA: Diagnosis not present

## 2016-09-03 DIAGNOSIS — N401 Enlarged prostate with lower urinary tract symptoms: Secondary | ICD-10-CM | POA: Diagnosis not present

## 2016-09-03 DIAGNOSIS — R3915 Urgency of urination: Secondary | ICD-10-CM | POA: Diagnosis not present

## 2016-09-04 ENCOUNTER — Other Ambulatory Visit: Payer: Self-pay | Admitting: Urology

## 2016-09-30 ENCOUNTER — Encounter (HOSPITAL_BASED_OUTPATIENT_CLINIC_OR_DEPARTMENT_OTHER): Payer: Self-pay | Admitting: *Deleted

## 2016-09-30 NOTE — Progress Notes (Signed)
Spoke with caregiver Gentry Roch -To [facility] at 0815-Istat,Ekg on arrival-Npo after Mn-no longer on BP medication.She will transport to [facility] and back home. Pt recently widowed and needs assistance at home .Has caregivers daily.Ambulates without assistance at home-uses a cane when out.

## 2016-10-05 DIAGNOSIS — R3915 Urgency of urination: Secondary | ICD-10-CM | POA: Diagnosis not present

## 2016-10-05 DIAGNOSIS — N401 Enlarged prostate with lower urinary tract symptoms: Secondary | ICD-10-CM | POA: Diagnosis not present

## 2016-10-12 ENCOUNTER — Encounter (HOSPITAL_BASED_OUTPATIENT_CLINIC_OR_DEPARTMENT_OTHER): Payer: Self-pay | Admitting: *Deleted

## 2016-10-12 ENCOUNTER — Encounter (HOSPITAL_BASED_OUTPATIENT_CLINIC_OR_DEPARTMENT_OTHER)
Admission: RE | Disposition: A | Discharge status: Home / Self Care | Payer: Self-pay | Source: Ambulatory Visit | Attending: Urology

## 2016-10-12 ENCOUNTER — Other Ambulatory Visit: Payer: Self-pay

## 2016-10-12 ENCOUNTER — Ambulatory Visit (HOSPITAL_BASED_OUTPATIENT_CLINIC_OR_DEPARTMENT_OTHER)
Admission: RE | Admit: 2016-10-12 | Discharge: 2016-10-12 | Disposition: A | Discharge status: Home / Self Care | Payer: Medicare Other | Source: Ambulatory Visit | Attending: Urology | Admitting: Urology

## 2016-10-12 ENCOUNTER — Ambulatory Visit (HOSPITAL_BASED_OUTPATIENT_CLINIC_OR_DEPARTMENT_OTHER): Payer: Medicare Other | Admitting: Anesthesiology

## 2016-10-12 DIAGNOSIS — I2510 Atherosclerotic heart disease of native coronary artery without angina pectoris: Secondary | ICD-10-CM | POA: Insufficient documentation

## 2016-10-12 DIAGNOSIS — Z96652 Presence of left artificial knee joint: Secondary | ICD-10-CM | POA: Insufficient documentation

## 2016-10-12 DIAGNOSIS — I10 Essential (primary) hypertension: Secondary | ICD-10-CM | POA: Insufficient documentation

## 2016-10-12 DIAGNOSIS — Z79899 Other long term (current) drug therapy: Secondary | ICD-10-CM | POA: Insufficient documentation

## 2016-10-12 DIAGNOSIS — F0390 Unspecified dementia without behavioral disturbance: Secondary | ICD-10-CM | POA: Insufficient documentation

## 2016-10-12 DIAGNOSIS — Z8551 Personal history of malignant neoplasm of bladder: Secondary | ICD-10-CM | POA: Insufficient documentation

## 2016-10-12 DIAGNOSIS — F0280 Dementia in other diseases classified elsewhere without behavioral disturbance: Secondary | ICD-10-CM | POA: Insufficient documentation

## 2016-10-12 DIAGNOSIS — N138 Other obstructive and reflux uropathy: Secondary | ICD-10-CM | POA: Diagnosis not present

## 2016-10-12 DIAGNOSIS — N529 Male erectile dysfunction, unspecified: Secondary | ICD-10-CM | POA: Diagnosis not present

## 2016-10-12 DIAGNOSIS — I739 Peripheral vascular disease, unspecified: Secondary | ICD-10-CM | POA: Diagnosis not present

## 2016-10-12 DIAGNOSIS — Z7982 Long term (current) use of aspirin: Secondary | ICD-10-CM | POA: Diagnosis not present

## 2016-10-12 DIAGNOSIS — Z951 Presence of aortocoronary bypass graft: Secondary | ICD-10-CM | POA: Insufficient documentation

## 2016-10-12 DIAGNOSIS — Z7901 Long term (current) use of anticoagulants: Secondary | ICD-10-CM | POA: Diagnosis not present

## 2016-10-12 DIAGNOSIS — Z955 Presence of coronary angioplasty implant and graft: Secondary | ICD-10-CM | POA: Diagnosis not present

## 2016-10-12 DIAGNOSIS — N401 Enlarged prostate with lower urinary tract symptoms: Secondary | ICD-10-CM | POA: Insufficient documentation

## 2016-10-12 HISTORY — PX: CYSTOSCOPY WITH INSERTION OF UROLIFT: SHX6678

## 2016-10-12 LAB — POCT I-STAT 4, (NA,K, GLUC, HGB,HCT)
Glucose, Bld: 91 mg/dL (ref 65–99)
HCT: 39 % (ref 39.0–52.0)
HEMOGLOBIN: 13.3 g/dL (ref 13.0–17.0)
Potassium: 3.8 mmol/L (ref 3.5–5.1)
SODIUM: 145 mmol/L (ref 135–145)

## 2016-10-12 SURGERY — CYSTOSCOPY WITH INSERTION OF UROLIFT
Anesthesia: General

## 2016-10-12 MED ORDER — DEXAMETHASONE SODIUM PHOSPHATE 10 MG/ML IJ SOLN
INTRAMUSCULAR | Status: AC
  Filled 2016-10-12: qty 1

## 2016-10-12 MED ORDER — DEXAMETHASONE SODIUM PHOSPHATE 4 MG/ML IJ SOLN
INTRAMUSCULAR | Status: DC | PRN
  Administered 2016-10-12: 4 mg via INTRAVENOUS

## 2016-10-12 MED ORDER — FENTANYL CITRATE (PF) 100 MCG/2ML IJ SOLN
25.0000 ug | INTRAMUSCULAR | Status: DC | PRN
  Filled 2016-10-12: qty 1

## 2016-10-12 MED ORDER — ONDANSETRON HCL 4 MG/2ML IJ SOLN
INTRAMUSCULAR | Status: AC
  Filled 2016-10-12: qty 2

## 2016-10-12 MED ORDER — SULFAMETHOXAZOLE-TRIMETHOPRIM 800-160 MG PO TABS: 1.0000 | ORAL_TABLET | Freq: Two times a day (BID) | ORAL | 0 refills | Status: DC

## 2016-10-12 MED ORDER — PROPOFOL 10 MG/ML IV BOLUS
INTRAVENOUS | Status: DC | PRN
  Administered 2016-10-12: 140 mg via INTRAVENOUS

## 2016-10-12 MED ORDER — CEFAZOLIN SODIUM-DEXTROSE 2-4 GM/100ML-% IV SOLN
INTRAVENOUS | Status: AC
  Filled 2016-10-12: qty 100

## 2016-10-12 MED ORDER — CEFAZOLIN SODIUM-DEXTROSE 2-4 GM/100ML-% IV SOLN
2.0000 g | INTRAVENOUS | Status: AC
  Administered 2016-10-12: 2 g via INTRAVENOUS
  Filled 2016-10-12: qty 100

## 2016-10-12 MED ORDER — PROPOFOL 10 MG/ML IV BOLUS
INTRAVENOUS | Status: AC
  Filled 2016-10-12: qty 20

## 2016-10-12 MED ORDER — FENTANYL CITRATE (PF) 100 MCG/2ML IJ SOLN
INTRAMUSCULAR | Status: DC | PRN
  Administered 2016-10-12: 50 ug via INTRAVENOUS

## 2016-10-12 MED ORDER — FENTANYL CITRATE (PF) 100 MCG/2ML IJ SOLN
INTRAMUSCULAR | Status: AC
  Filled 2016-10-12: qty 2

## 2016-10-12 MED ORDER — TRAMADOL HCL 50 MG PO TABS: 50.0000 mg | ORAL_TABLET | Freq: Four times a day (QID) | ORAL | 0 refills | Status: DC | PRN

## 2016-10-12 MED ORDER — LACTATED RINGERS IV SOLN
INTRAVENOUS | Status: DC
  Administered 2016-10-12: 08:00:00 via INTRAVENOUS
  Filled 2016-10-12: qty 1000

## 2016-10-12 MED ORDER — ONDANSETRON HCL 4 MG/2ML IJ SOLN
INTRAMUSCULAR | Status: DC | PRN
  Administered 2016-10-12: 4 mg via INTRAVENOUS

## 2016-10-12 MED ORDER — EPHEDRINE SULFATE-NACL 50-0.9 MG/10ML-% IV SOSY
PREFILLED_SYRINGE | INTRAVENOUS | Status: DC | PRN
  Administered 2016-10-12: 15 mg via INTRAVENOUS

## 2016-10-12 MED ORDER — EPHEDRINE 5 MG/ML INJ
INTRAVENOUS | Status: AC
  Filled 2016-10-12: qty 10

## 2016-10-12 SURGICAL SUPPLY — 16 items
BAG DRAIN URO-CYSTO SKYTR STRL (DRAIN) ×2 IMPLANT
BAG DRN ANRFLXCHMBR STRAP LEK (BAG) ×1
BAG DRN UROCATH (DRAIN) ×1
BAG URINE DRAINAGE (UROLOGICAL SUPPLIES) ×1 IMPLANT
BAG URINE LEG 19OZ MD ST LTX (BAG) ×2 IMPLANT
BAG URINE LEG 500ML (DRAIN) ×1 IMPLANT
CATH FOLEY 2WAY SLVR  5CC 18FR (CATHETERS)
CATH FOLEY 2WAY SLVR 5CC 18FR (CATHETERS) IMPLANT
CLOTH BEACON ORANGE TIMEOUT ST (SAFETY) ×2 IMPLANT
GLOVE BIO SURGEON STRL SZ8 (GLOVE) ×2 IMPLANT
GOWN STRL REUS W/TWL XL LVL3 (GOWN DISPOSABLE) ×2 IMPLANT
MANIFOLD NEPTUNE II (INSTRUMENTS) ×2 IMPLANT
PACK CYSTO (CUSTOM PROCEDURE TRAY) ×2 IMPLANT
SYSTEM UROLIFT (Male Continence) ×5 IMPLANT
TUBE CONNECTING 12X1/4 (SUCTIONS) ×2 IMPLANT
WATER STERILE IRR 3000ML UROMA (IV SOLUTION) ×3 IMPLANT

## 2016-10-12 NOTE — H&P (Signed)
H&P  Chief Complaint: difficulty urinating  History of Present Illness: 83 year old male presents at this time for Urolift procedure.  He has been followed in this office by Dr. Louis Meckel for significant lower urinary tract symptomatology.  He does have frequency, urgency, significant nocturia.  He has failed medical therapy as well as PTNS. He does have an obstructive prostate cystoscopically.  At this point, he desires further management, nonmedical in nature, and was recently seen in consultation by me.  Past Medical History:  Diagnosis Date  . CAD (coronary artery disease)   . Cancer ([place])    hx of bladder cancer- 2002   . Dementia due to general medical condition without behavioral disturbance    unspecified (per wife) dx at [place] and other specified forms of tremor 07/15/2012  . HTN (hypertension) 01/30/2013  . Mobitz type 2 second degree atrioventricular block   . RBBB   . Urinary frequency     Past Surgical History:  Procedure Laterality Date  . CARDIAC CATHETERIZATION  04/27/2007   3 vessel disease  . CORONARY ANGIOPLASTY  1988  . CORONARY ANGIOPLASTY WITH STENT PLACEMENT  1998   LAD  . CORONARY ARTERY BYPASS GRAFT  05/10/2007   LIMA to LAD, SVG to 1st and 2nd OM and SVG to RCA  . FEMUR IM NAIL Right 05/19/2015   Procedure: INTRAMEDULLARY (IM) RETROGRADE FEMORAL NAILING with ZIMMER CABLES;  Surgeon: Rod Can, MD;  Location: [facility];  Service: Orthopedics;  Laterality: Right;  . JOINT REPLACEMENT Left 05/08/2015  . KNEE SURGERY Right 2006  . NM MYOCAR PERF WALL MOTION  04/20/2007   mod. reversible ischemia  . TOTAL KNEE ARTHROPLASTY Left 05/08/2015   Procedure: LEFT TOTAL KNEE ARTHROPLASTY;  Surgeon: Latanya Maudlin, MD;  Location: [facility];  Service: Orthopedics;  Laterality: Left;  . US ECHOCARDIOGRAPHY  10/01/2010   mild concentric LVH,LA mildly dilated,RA mild to mod. dilated,mild MR,mild to mod. TR,mild AI,mild PI,sinus rhythm w/pauses    Home Medications:   Allergies as of 10/12/2016   No Known Allergies     Medication List    Notice   Cannot display discharge medications because the patient has not yet been admitted.     Allergies: No Known Allergies  Family History  Problem Relation Age of Onset  . Pneumonia Mother   . Cancer Father   . Cancer Brother     Social History:  reports that he has never smoked. He has never used smokeless tobacco. He reports that he does not drink alcohol or use drugs.  ROS: A complete review of systems was performed.  All systems are negative except for pertinent findings as noted.  Physical Exam:  Vital signs in last 24 hours:   Constitutional:  Alert and oriented, No acute distress Cardiovascular: Regular rate and rhythm, No JVD Respiratory: Normal respiratory effort, Lungs clear bilaterally GI: Abdomen is soft, nontender, nondistended, no abdominal masses Genitourinary: No CVAT. Normal male phallus, testes are descended bilaterally and non-tender and without masses, scrotum is normal in appearance without lesions or masses, perineum is normal on inspection. Rectal: Normal sphincter tone, no rectal masses, prostate is non tender and without nodularity. Prostate size is estimated to be 30 cc Lymphatic: No lymphadenopathy Neurologic: Grossly intact, no focal deficits Psychiatric: Normal mood and affect  Laboratory Data:  No results for input(s): WBC, HGB, HCT, PLT in the last 72 hours.  No results for input(s): NA, K, CL, GLUCOSE, BUN, CALCIUM, CREATININE in the last 72 hours.  Invalid input(s): CO3   No results found for this or any previous visit (from the past 24 hour(s)). No results found for this or any previous visit (from the past 240 hour(s)).  Renal Function: No results for input(s): CREATININE in the last 168 hours. CrCl cannot be calculated (Patient's most recent lab result is older than the maximum 21 days allowed.).  Radiologic Imaging: No results  found.  Impression/Assessment:  BPH with significant lower urinary tract symptomatology, some of which may be related to detrusor instability/overactive bladder.  Plan:  Urolift procedure

## 2016-10-12 NOTE — Discharge Instructions (Signed)
1. You may see some blood in the urine and may have some burning with urination for 48-72 hours. You also may notice that you have to urinate more frequently or urgently after your procedure which is normal.                It is OK to restart your blood thinner and aspirin when urine has no blood and bloody drainage from your penis has stopped.  2. You should call should you develop an inability urinate, fever > 101, persistent nausea and vomiting that prevents you from eating or drinking to stay hydrated.  3. If you have a catheter, you will be taught how to take care of the catheter by the nursing staff prior to discharge from the hospital.  You may periodically feel a strong urge to void with the catheter in place.  This is a bladder spasm and most often can occur when having a bowel movement or moving around. It is typically self-limited and usually will stop after a few minutes.  You may use some Vaseline or Neosporin around the tip of the catheter to reduce friction at the tip of the penis. You may also see some blood in the urine.  A very small amount of blood can make the urine look quite red.  As long as the catheter is draining well, there usually is not a problem.  However, if the catheter is not draining well and is bloody, you should call the office [phone]) to notify us.   Post Anesthesia Home Care Instructions  Activity: Get plenty of rest for the remainder of the day. A responsible individual must stay with you for 24 hours following the procedure.  For the next 24 hours, DO NOT: -Drive a car -Paediatric nurse -Drink alcoholic beverages -Take any medication unless instructed by your physician -Make any legal decisions or sign important papers.  Meals: Start with liquid foods such as gelatin or soup. Progress to regular foods as tolerated. Avoid greasy, spicy, heavy foods. If nausea and/or vomiting occur, drink only clear liquids until the nausea and/or vomiting subsides.  Call your physician if vomiting continues.  Special Instructions/Symptoms: Your throat may feel dry or sore from the anesthesia or the breathing tube placed in your throat during surgery. If this causes discomfort, gargle with warm salt water. The discomfort should disappear within 24 hours.  If you had a scopolamine patch placed behind your ear for the management of post- operative nausea and/or vomiting:  1. The medication in the patch is effective for 72 hours, after which it should be removed.  Wrap patch in a tissue and discard in the trash. Wash hands thoroughly with soap and water. 2. You may remove the patch earlier than 72 hours if you experience unpleasant side effects which may include dry mouth, dizziness or visual disturbances. 3. Avoid touching the patch. Wash your hands with soap and water after contact with the patch.

## 2016-10-12 NOTE — Transfer of Care (Signed)
Immediate Anesthesia Transfer of Care Note  Patient: [name]  Procedure(s) Performed: Procedure(s) (LRB): CYSTOSCOPY WITH INSERTION OF UROLIFT (N/A)  Patient Location: PACU  Anesthesia Type: General  Level of Consciousness: awake, oriented, sedated and patient cooperative  Airway & Oxygen Therapy: Patient Spontanous Breathing and Patient connected to face mask oxygen  Post-op Assessment: Report given to PACU RN and Post -op Vital signs reviewed and stable  Post vital signs: Reviewed and stable  Complications: No apparent anesthesia complications Last Vitals:  Vitals:   10/12/16 0751 10/12/16 0949  BP: (!) 184/88 (P) 138/79  Pulse: 79   Resp: 16   Temp: (!) 36.3 C (P) 36.4 C  SpO2: 98%     Last Pain:  Vitals:   10/12/16 0751  TempSrc: Oral      Patients Stated Pain Goal: 7 (10/12/16 0814)

## 2016-10-12 NOTE — Interval H&P Note (Signed)
History and Physical Interval Note:  10/12/2016 9:01 AM  [name]  has presented today for surgery, with the diagnosis of BENIGN PROSTATIC HYPERPLASIA  The various methods of treatment have been discussed with the patient and family. After consideration of risks, benefits and other options for treatment, the patient has consented to  Procedure(s): CYSTOSCOPY WITH INSERTION OF UROLIFT (N/A) as a surgical intervention .  The patient's history has been reviewed, patient examined, no change in status, stable for surgery.  I have reviewed the patient's chart and labs.  Questions were answered to the patient's satisfaction.     Jorja Loa

## 2016-10-12 NOTE — Anesthesia Procedure Notes (Signed)
Procedure Name: LMA Insertion Date/Time: 10/12/2016 9:23 AM Performed by: Denna Haggard D Pre-anesthesia Checklist: Patient identified, Emergency Drugs available, Suction available and Patient being monitored Patient Re-evaluated:Patient Re-evaluated prior to induction Oxygen Delivery Method: Circle system utilized Preoxygenation: Pre-oxygenation with 100% oxygen Induction Type: IV induction Ventilation: Mask ventilation without difficulty LMA: LMA inserted LMA Size: 4.0 Number of attempts: 1 Airway Equipment and Method: Bite block Placement Confirmation: positive ETCO2 Tube secured with: Tape Dental Injury: Teeth and Oropharynx as per pre-operative assessment

## 2016-10-12 NOTE — Op Note (Signed)
Preoperative diagnosis: BPH with obstructive symptomatology.  Postoperative diagnosis: Same  Principal procedure: Urolift procedure, with the placement of 5 implants.  Surgeon: Diona Fanti  Anesthesia: Gen. with LMA  Complications: None  Drains: none  Estimated blood loss: Less than 25 mL  Indications: 83 -year-old male with obstructive symptomatology secondary to BPH.  The patient's symptoms have progressed, and he has requested further management.  Management options including TURP with resection/ablation of the prostate as well as Urolift were discussed.  The patient has chosen to have a Urolift procedure.  He has been instructed to the procedure as well as risks and complications which include but are not limited to infection, bleeding, and inadequate treatment with the Urolift procedure alone, anesthetic complications, among others.  He understands these and desires to proceed.  Findings: Using the 17 French cystoscope, urethra and bladder were inspected.  There were no urethral lesions.  Prostatic urethra was obstructed secondary to bilobar hypertrophy.  The bladder was inspected circumferentially.  This revealed normal findings.  Description of procedure: The patient was properly identified in the holding area.  He received preoperative IV antibiotics.  He was taken to the operating room where general anesthetic was administered with the LMA.  He is placed in the dorsolithotomy position.  Genitalia and perineum were prepped and draped.  Proper timeout was performed.  A 97F cystoscope was inserted into the bladder. The cystoscopy bridge was replaced with a UroLift delivery device.The first treatment site was the patient's right side approximately 1.5cm distal to the bladder neck. The distal tip of the delivery device was then angled laterally approximately 20 degrees at this position to compress the lateral lobe. The trigger was pulled, thereby deploying a needle containing the implant  through the prostate. The needle was then retracted, allowing one end of the implant to be delivered to the capsular surface of the prostate. The implant was then tensioned to assure capsular seating and removal of slack monofilament. The device was then angled back toward midline and slowly advanced proximally until cystoscopic verification of the monofilament being centered in the delivery bay. The urethral end piece was then affixed to the monofilament thereby tailoring the size of the implant. Excess filament was then severed. The delivery device was then re-advanced into the bladder. The delivery device was then replaced with cystoscope and bridge and the implant location and opening effect was confirmed cystoscopically. The same procedure was then repeated on the left side, and 2 additional implants were delivered just proximal to the verumontanum, again one on right and one on left side of the prostate, following the same technique. Additionally, a 5th implant was delivered to the left mid prostate.    A final cystoscopy was conducted first to inspect the location and state of each implant and second, to confirm the presence of a continuous anterior channel was present through the prostatic urethra with irrigation flow turned off. 5 Implants were delivered in total.  Following this, the scope was removed and a 16 French Foley catheter was placed and 100 cc of water was placed in his bladder. He was then awakened and taken to the PACU in stable condition.  He tolerated the procedure well.

## 2016-10-12 NOTE — Anesthesia Postprocedure Evaluation (Signed)
Anesthesia Post Note  Patient: [name]  Procedure(s) Performed: Procedure(s) (LRB): CYSTOSCOPY WITH INSERTION OF UROLIFT (N/A)     Patient location during evaluation: PACU Anesthesia Type: General Level of consciousness: awake and alert Pain management: pain level controlled Vital Signs Assessment: post-procedure vital signs reviewed and stable Respiratory status: spontaneous breathing, nonlabored ventilation, respiratory function stable and patient connected to nasal cannula oxygen Cardiovascular status: blood pressure returned to baseline and stable Postop Assessment: no signs of nausea or vomiting Anesthetic complications: no    Last Vitals:  Vitals:   10/12/16 1015 10/12/16 1030  BP: (!) 160/91 (!) 154/76  Pulse: 77 70  Resp: 15 10  Temp:    SpO2: 100% 99%    Last Pain:  Vitals:   10/12/16 0949  TempSrc:   PainSc: Tyler Deis

## 2016-10-12 NOTE — Anesthesia Preprocedure Evaluation (Signed)
Anesthesia Evaluation  Patient identified by MRN, date of birth, ID band Patient awake    Reviewed: Allergy & Precautions, H&P , NPO status , Patient's Chart, lab work & pertinent test results  Airway Mallampati: II  TM Distance: >3 FB Neck ROM: Full    Dental no notable dental hx. (+) Dental Advisory Given, Partial Lower, Missing   Pulmonary neg pulmonary ROS,    Pulmonary exam normal breath sounds clear to auscultation       Cardiovascular hypertension, Pt. on medications + CAD, + CABG (LIMA to LAD, SVG sequential to first and second OM, SVG to distal RCA 2009) and + Peripheral Vascular Disease  + dysrhythmias (RBBB)  Rhythm:Regular Rate:Normal + Systolic murmurs    Neuro/Psych negative neurological ROS  negative psych ROS   GI/Hepatic negative GI ROS, Neg liver ROS,   Endo/Other  negative endocrine ROS  Renal/GU negative Renal ROS  negative genitourinary   Musculoskeletal   Abdominal   Peds  Hematology  (+) Blood dyscrasia, anemia ,   Anesthesia Other Findings   Reproductive/Obstetrics negative OB ROS                             Anesthesia Physical  Anesthesia Plan  ASA: III  Anesthesia Plan: General   Post-op Pain Management:    Induction: Intravenous  PONV Risk Score and Plan:   Airway Management Planned: LMA  Additional Equipment:   Intra-op Plan:   Post-operative Plan: Extubation in OR  Informed Consent: I have reviewed the patients History and Physical, chart, labs and discussed the procedure including the risks, benefits and alternatives for the proposed anesthesia with the patient or authorized representative who has indicated his/her understanding and acceptance.   Dental advisory given  Plan Discussed with: CRNA  Anesthesia Plan Comments:         Anesthesia Quick Evaluation

## 2016-10-13 ENCOUNTER — Encounter (HOSPITAL_BASED_OUTPATIENT_CLINIC_OR_DEPARTMENT_OTHER): Payer: Self-pay | Admitting: Urology

## 2016-10-26 ENCOUNTER — Encounter: Payer: Self-pay | Admitting: Cardiology

## 2016-10-26 DIAGNOSIS — I359 Nonrheumatic aortic valve disorder, unspecified: Secondary | ICD-10-CM | POA: Diagnosis not present

## 2016-10-26 DIAGNOSIS — I350 Nonrheumatic aortic (valve) stenosis: Secondary | ICD-10-CM | POA: Diagnosis not present

## 2016-10-26 DIAGNOSIS — I2510 Atherosclerotic heart disease of native coronary artery without angina pectoris: Secondary | ICD-10-CM | POA: Diagnosis not present

## 2016-10-26 DIAGNOSIS — Z951 Presence of aortocoronary bypass graft: Secondary | ICD-10-CM | POA: Diagnosis not present

## 2016-10-26 DIAGNOSIS — E785 Hyperlipidemia, unspecified: Secondary | ICD-10-CM | POA: Diagnosis not present

## 2016-10-26 DIAGNOSIS — I119 Hypertensive heart disease without heart failure: Secondary | ICD-10-CM | POA: Diagnosis not present

## 2016-11-23 DIAGNOSIS — N401 Enlarged prostate with lower urinary tract symptoms: Secondary | ICD-10-CM | POA: Diagnosis not present

## 2016-11-23 DIAGNOSIS — N3941 Urge incontinence: Secondary | ICD-10-CM | POA: Diagnosis not present

## 2016-11-23 DIAGNOSIS — R351 Nocturia: Secondary | ICD-10-CM | POA: Diagnosis not present

## 2016-11-23 DIAGNOSIS — R3915 Urgency of urination: Secondary | ICD-10-CM | POA: Diagnosis not present

## 2017-01-11 DIAGNOSIS — N401 Enlarged prostate with lower urinary tract symptoms: Secondary | ICD-10-CM | POA: Diagnosis not present

## 2017-01-11 DIAGNOSIS — R351 Nocturia: Secondary | ICD-10-CM | POA: Diagnosis not present

## 2017-01-12 DIAGNOSIS — F0150 Vascular dementia without behavioral disturbance: Secondary | ICD-10-CM | POA: Diagnosis not present

## 2017-01-12 DIAGNOSIS — G479 Sleep disorder, unspecified: Secondary | ICD-10-CM | POA: Diagnosis not present

## 2017-01-12 DIAGNOSIS — M545 Low back pain: Secondary | ICD-10-CM | POA: Diagnosis not present

## 2017-03-12 DIAGNOSIS — M545 Low back pain: Secondary | ICD-10-CM | POA: Diagnosis not present

## 2017-03-12 DIAGNOSIS — Z96652 Presence of left artificial knee joint: Secondary | ICD-10-CM | POA: Diagnosis not present

## 2017-03-12 DIAGNOSIS — Z96651 Presence of right artificial knee joint: Secondary | ICD-10-CM | POA: Diagnosis not present

## 2017-03-12 DIAGNOSIS — M1712 Unilateral primary osteoarthritis, left knee: Secondary | ICD-10-CM | POA: Insufficient documentation

## 2017-03-12 DIAGNOSIS — Z96659 Presence of unspecified artificial knee joint: Secondary | ICD-10-CM | POA: Insufficient documentation

## 2017-03-24 DIAGNOSIS — M6281 Muscle weakness (generalized): Secondary | ICD-10-CM | POA: Diagnosis not present

## 2017-03-24 DIAGNOSIS — M545 Low back pain: Secondary | ICD-10-CM | POA: Diagnosis not present

## 2017-03-31 DIAGNOSIS — M6281 Muscle weakness (generalized): Secondary | ICD-10-CM | POA: Diagnosis not present

## 2017-03-31 DIAGNOSIS — M545 Low back pain: Secondary | ICD-10-CM | POA: Diagnosis not present

## 2017-04-05 DIAGNOSIS — M545 Low back pain: Secondary | ICD-10-CM | POA: Diagnosis not present

## 2017-04-05 DIAGNOSIS — M6281 Muscle weakness (generalized): Secondary | ICD-10-CM | POA: Diagnosis not present

## 2017-04-08 DIAGNOSIS — M545 Low back pain: Secondary | ICD-10-CM | POA: Diagnosis not present

## 2017-04-08 DIAGNOSIS — M6281 Muscle weakness (generalized): Secondary | ICD-10-CM | POA: Diagnosis not present

## 2017-04-12 ENCOUNTER — Encounter (HOSPITAL_COMMUNITY): Payer: Self-pay | Admitting: *Deleted

## 2017-04-12 ENCOUNTER — Other Ambulatory Visit: Payer: Self-pay

## 2017-04-12 ENCOUNTER — Emergency Department (HOSPITAL_COMMUNITY): Payer: Medicare Other

## 2017-04-12 DIAGNOSIS — Z5321 Procedure and treatment not carried out due to patient leaving prior to being seen by health care provider: Secondary | ICD-10-CM | POA: Insufficient documentation

## 2017-04-12 DIAGNOSIS — R42 Dizziness and giddiness: Secondary | ICD-10-CM | POA: Diagnosis not present

## 2017-04-12 DIAGNOSIS — I959 Hypotension, unspecified: Secondary | ICD-10-CM | POA: Diagnosis not present

## 2017-04-12 DIAGNOSIS — R29818 Other symptoms and signs involving the nervous system: Secondary | ICD-10-CM | POA: Diagnosis not present

## 2017-04-12 LAB — COMPREHENSIVE METABOLIC PANEL
ALT: 12 U/L — ABNORMAL LOW (ref 17–63)
AST: 19 U/L (ref 15–41)
Albumin: 3.8 g/dL (ref 3.5–5.0)
Alkaline Phosphatase: 58 U/L (ref 38–126)
Anion gap: 8 (ref 5–15)
BUN: 13 mg/dL (ref 6–20)
CHLORIDE: 107 mmol/L (ref 101–111)
CO2: 27 mmol/L (ref 22–32)
Calcium: 9.9 mg/dL (ref 8.9–10.3)
Creatinine, Ser: 1.26 mg/dL — ABNORMAL HIGH (ref 0.61–1.24)
GFR calc Af Amer: 56 mL/min — ABNORMAL LOW (ref >60)
GFR, EST NON AFRICAN AMERICAN: 48 mL/min — AB (ref >60)
Glucose, Bld: 110 mg/dL — ABNORMAL HIGH (ref 65–99)
POTASSIUM: 4.7 mmol/L (ref 3.5–5.1)
Sodium: 142 mmol/L (ref 135–145)
Total Bilirubin: 0.7 mg/dL (ref 0.3–1.2)
Total Protein: 8.5 g/dL — ABNORMAL HIGH (ref 6.5–8.1)

## 2017-04-12 LAB — DIFFERENTIAL
BASOS ABS: 0 10*3/uL (ref 0.0–0.1)
BASOS PCT: 0 %
EOS ABS: 0 10*3/uL (ref 0.0–0.7)
Eosinophils Relative: 1 %
Lymphocytes Relative: 31 %
Lymphs Abs: 1.3 10*3/uL (ref 0.7–4.0)
MONOS PCT: 11 %
Monocytes Absolute: 0.5 10*3/uL (ref 0.1–1.0)
NEUTROS ABS: 2.3 10*3/uL (ref 1.7–7.7)
NEUTROS PCT: 57 %

## 2017-04-12 LAB — I-STAT CHEM 8, ED
BUN: 17 mg/dL (ref 6–20)
CHLORIDE: 105 mmol/L (ref 101–111)
CREATININE: 1.2 mg/dL (ref 0.61–1.24)
Calcium, Ion: 1.24 mmol/L (ref 1.15–1.40)
Glucose, Bld: 110 mg/dL — ABNORMAL HIGH (ref 65–99)
HEMATOCRIT: 41 % (ref 39.0–52.0)
HEMOGLOBIN: 13.9 g/dL (ref 13.0–17.0)
POTASSIUM: 4.6 mmol/L (ref 3.5–5.1)
Sodium: 143 mmol/L (ref 135–145)
TCO2: 28 mmol/L (ref 22–32)

## 2017-04-12 LAB — I-STAT TROPONIN, ED: TROPONIN I, POC: 0.01 ng/mL (ref 0.00–0.08)

## 2017-04-12 LAB — CBC
HEMATOCRIT: 41.2 % (ref 39.0–52.0)
Hemoglobin: 13.8 g/dL (ref 13.0–17.0)
MCH: 34.3 pg — ABNORMAL HIGH (ref 26.0–34.0)
MCHC: 33.5 g/dL (ref 30.0–36.0)
MCV: 102.5 fL — ABNORMAL HIGH (ref 78.0–100.0)
Platelets: 155 10*3/uL (ref 150–400)
RBC: 4.02 MIL/uL — ABNORMAL LOW (ref 4.22–5.81)
RDW: 12.5 % (ref 11.5–15.5)
WBC: 4.1 10*3/uL (ref 4.0–10.5)

## 2017-04-12 LAB — PROTIME-INR
INR: 1.02
Prothrombin Time: 13.3 seconds (ref 11.4–15.2)

## 2017-04-12 LAB — APTT: APTT: 35 s (ref 24–36)

## 2017-04-12 NOTE — ED Triage Notes (Signed)
Pt woke up from a nap around 3pm c/o dizziness and felt off balance. Realized when he stood that he felt more swimmy-headed and had to hold onto things to help balance himself. Pt was sent by [facility] in clinic with EKG showing RBBB(hx of same) and PVCs. Negative orthostatics at the MD office. No neuro deficits at present.

## 2017-04-13 ENCOUNTER — Emergency Department (HOSPITAL_COMMUNITY)
Admission: EM | Admit: 2017-04-13 | Discharge: 2017-04-13 | Disposition: A | Discharge status: Home / Self Care | Payer: Medicare Other | Attending: Emergency Medicine | Admitting: Emergency Medicine

## 2017-04-13 NOTE — ED Notes (Signed)
Unable to locate pt. at waiting area several times by staff.  

## 2017-04-19 DIAGNOSIS — M6281 Muscle weakness (generalized): Secondary | ICD-10-CM | POA: Diagnosis not present

## 2017-04-19 DIAGNOSIS — M545 Low back pain: Secondary | ICD-10-CM | POA: Diagnosis not present

## 2017-04-22 DIAGNOSIS — M545 Low back pain: Secondary | ICD-10-CM | POA: Diagnosis not present

## 2017-04-22 DIAGNOSIS — M6281 Muscle weakness (generalized): Secondary | ICD-10-CM | POA: Diagnosis not present

## 2017-04-26 DIAGNOSIS — M545 Low back pain: Secondary | ICD-10-CM | POA: Diagnosis not present

## 2017-04-28 DIAGNOSIS — M545 Low back pain: Secondary | ICD-10-CM | POA: Diagnosis not present

## 2017-04-28 DIAGNOSIS — M6281 Muscle weakness (generalized): Secondary | ICD-10-CM | POA: Diagnosis not present

## 2017-05-05 DIAGNOSIS — M6281 Muscle weakness (generalized): Secondary | ICD-10-CM | POA: Diagnosis not present

## 2017-05-05 DIAGNOSIS — M545 Low back pain: Secondary | ICD-10-CM | POA: Diagnosis not present

## 2017-05-07 DIAGNOSIS — M6281 Muscle weakness (generalized): Secondary | ICD-10-CM | POA: Diagnosis not present

## 2017-05-07 DIAGNOSIS — M545 Low back pain: Secondary | ICD-10-CM | POA: Diagnosis not present

## 2017-05-18 DIAGNOSIS — M545 Low back pain: Secondary | ICD-10-CM | POA: Diagnosis not present

## 2017-05-18 DIAGNOSIS — M6281 Muscle weakness (generalized): Secondary | ICD-10-CM | POA: Diagnosis not present

## 2017-06-09 DIAGNOSIS — J069 Acute upper respiratory infection, unspecified: Secondary | ICD-10-CM | POA: Diagnosis not present

## 2017-06-09 IMAGING — CR DG CHEST 2V
2 series · 2 of 2 positions shown · non-contrast
Comparison: PA and lateral chest 06/02/2007.

CLINICAL DATA: Preoperative examination. Patient for knee
replacement. Initial encounter.

EXAM:
CHEST  2 VIEW

[w chest pa]
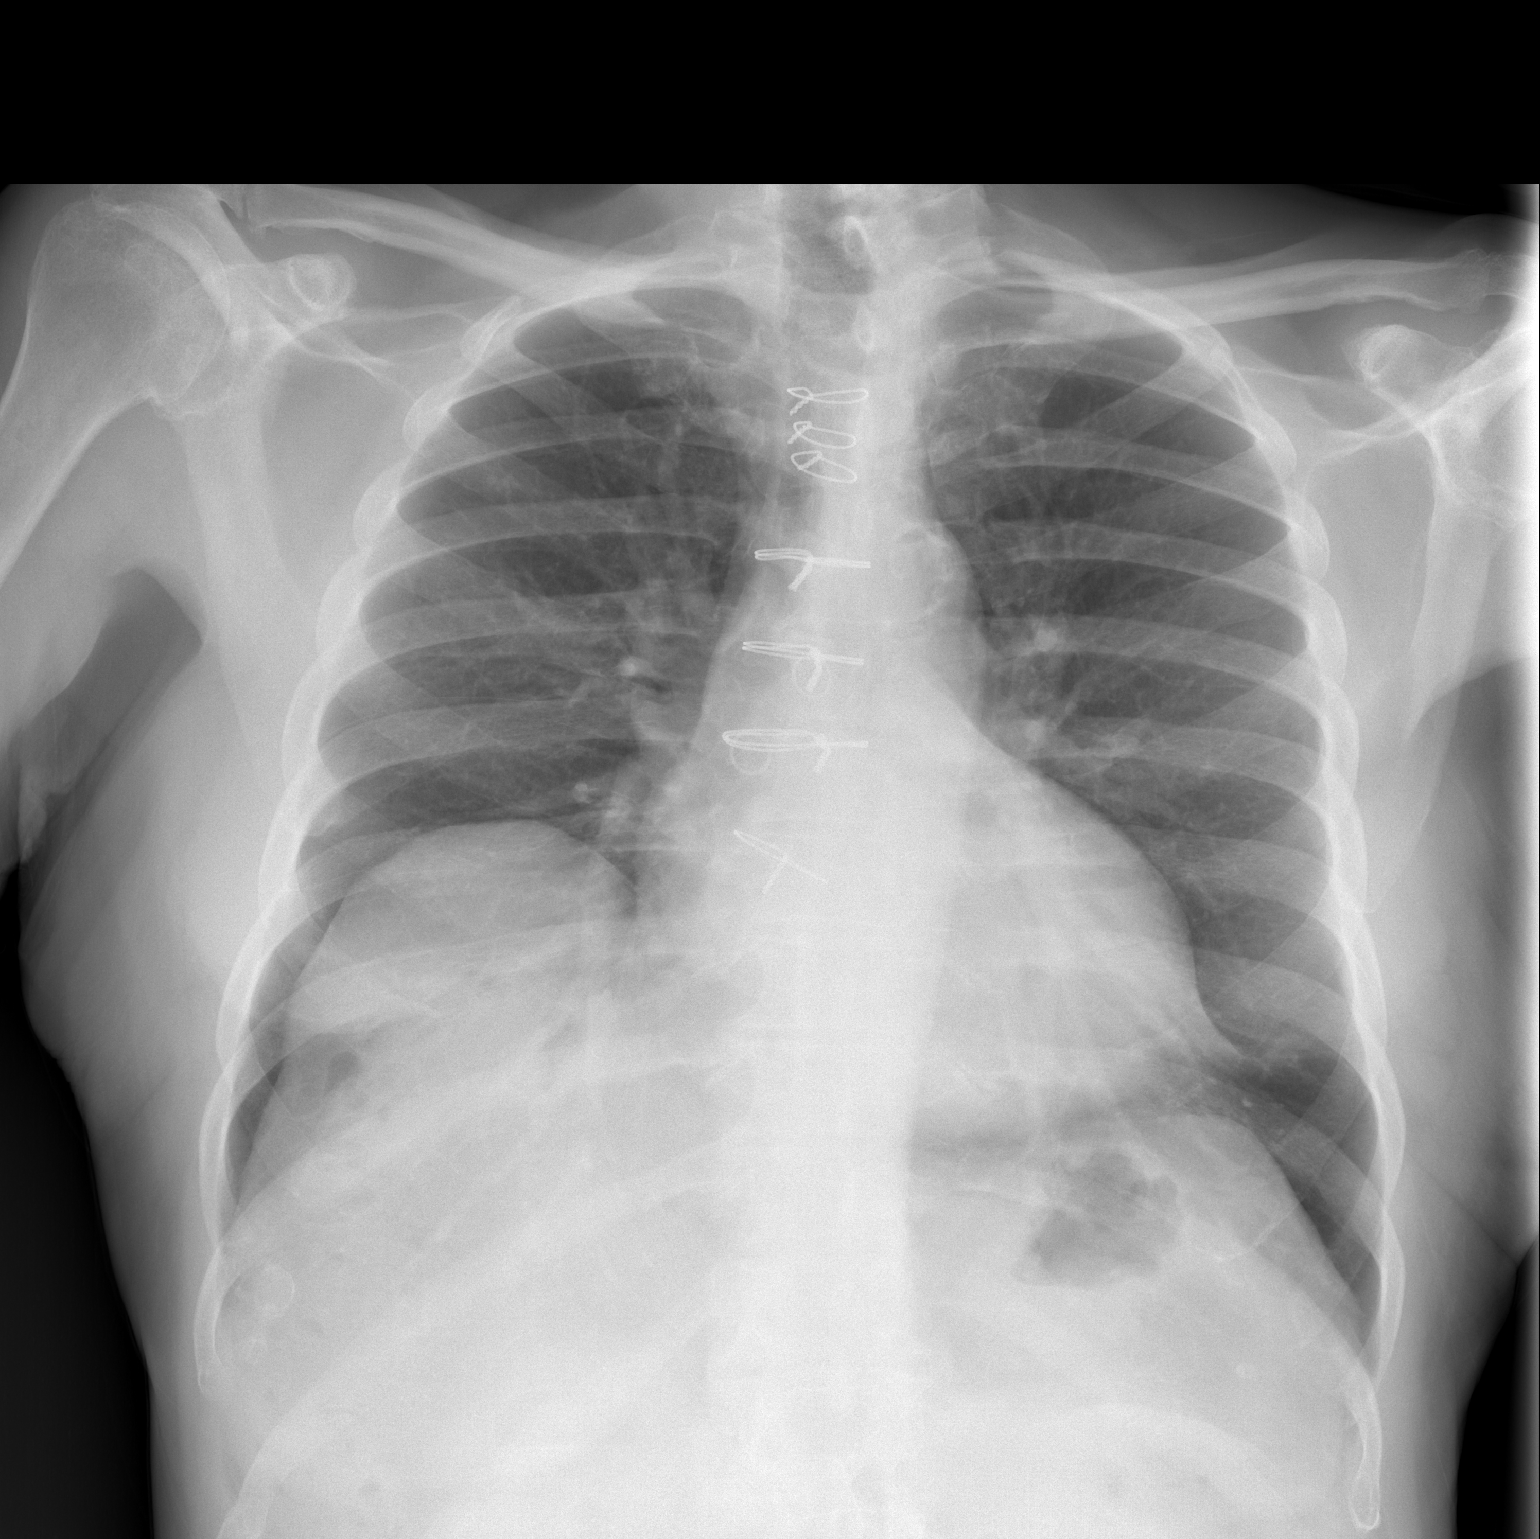

[w chest lat]
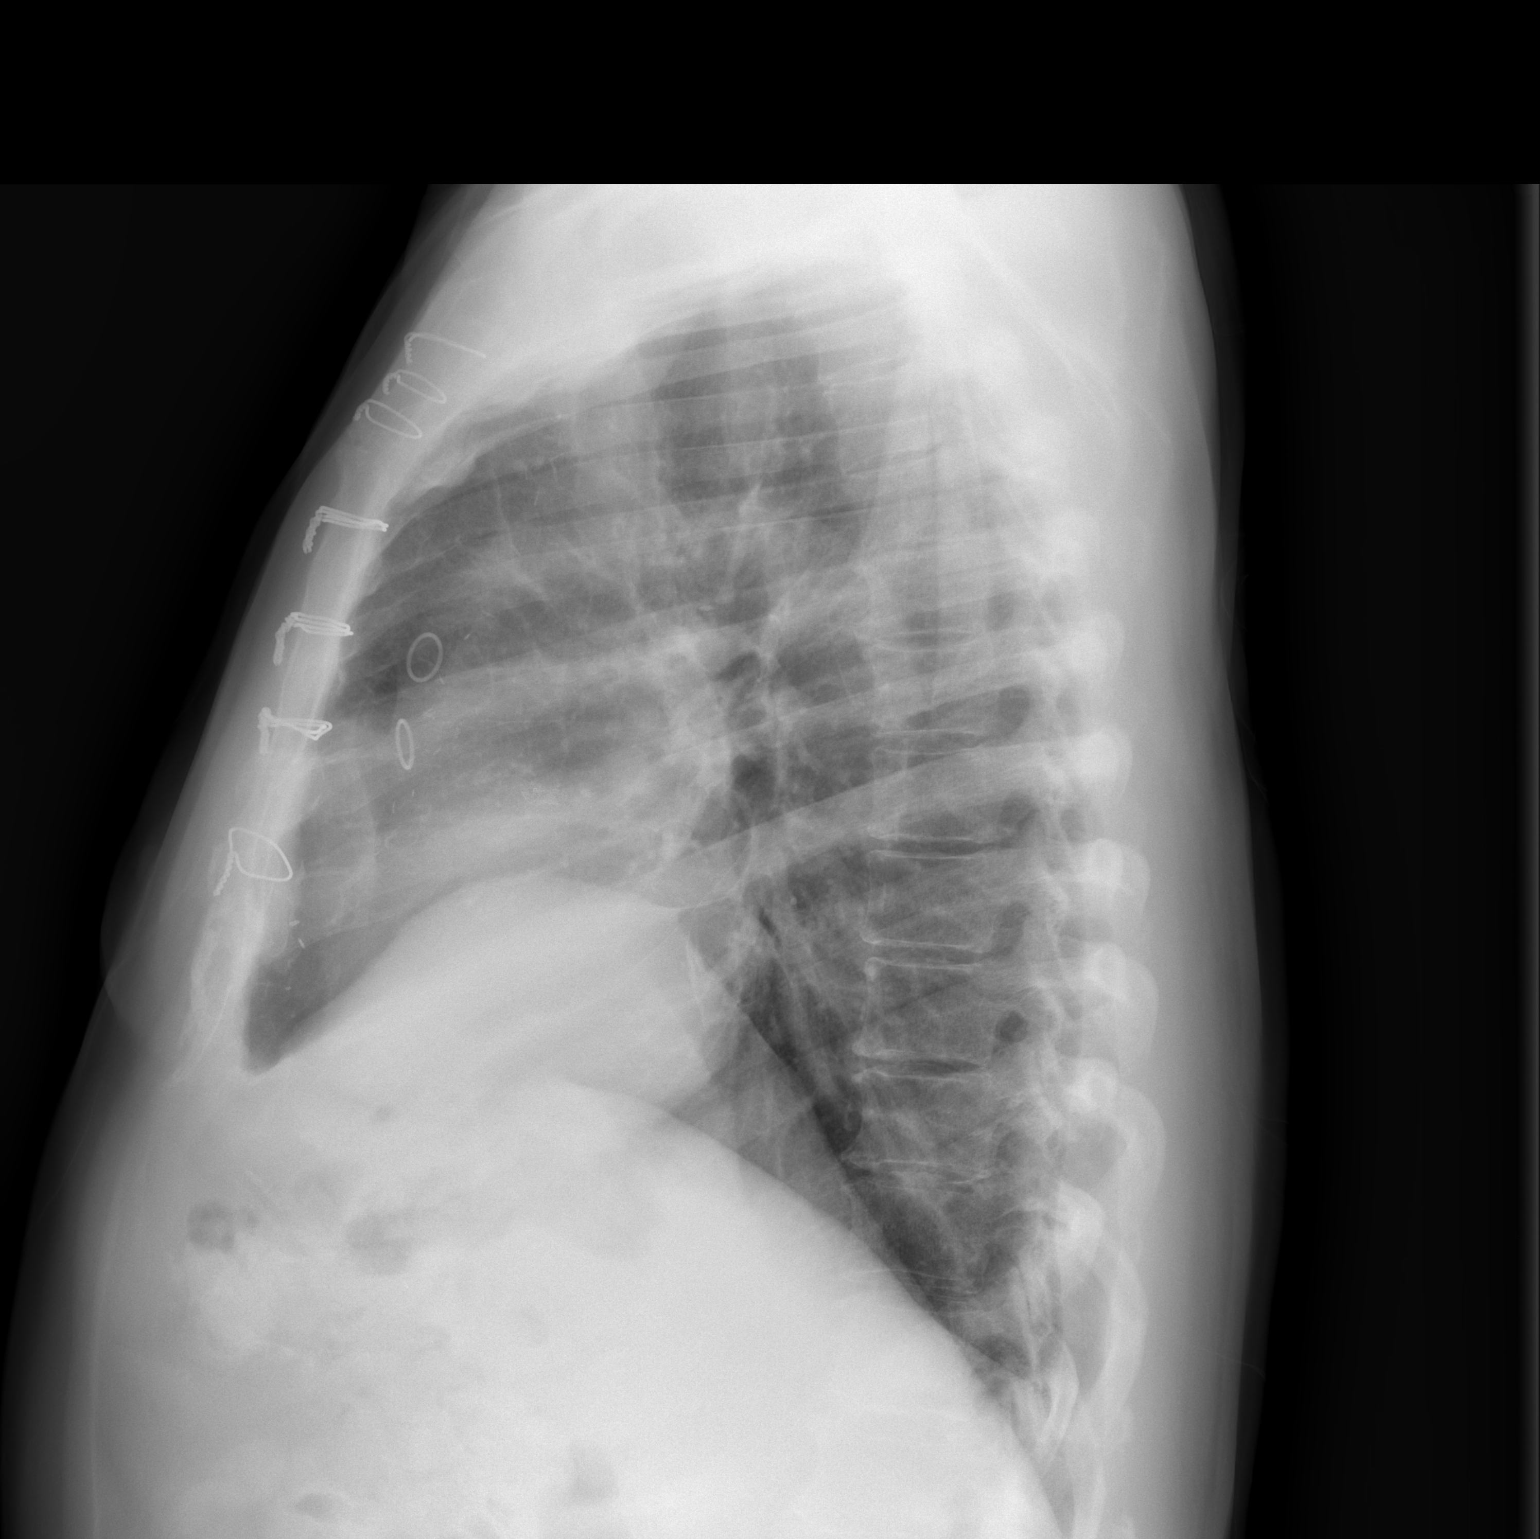

[2 of 2 positions shown; findings below may reference images not displayed]

FINDINGS: The lungs are clear. The patient is status post CABG. Eventration of
the right hemidiaphragm is unchanged. Heart size is upper normal. No
pneumothorax or pleural effusion.
IMPRESSION: No acute disease.

## 2017-06-25 IMAGING — CR DG FEMUR 2+V*R*
6 series · 6 of 6 positions shown · non-contrast
Comparison: None.

CLINICAL DATA: 89-year-old male who fell at home. Pain. Initial
encounter.

EXAM:
RIGHT FEMUR 2 VIEWS

[x knee lat right]
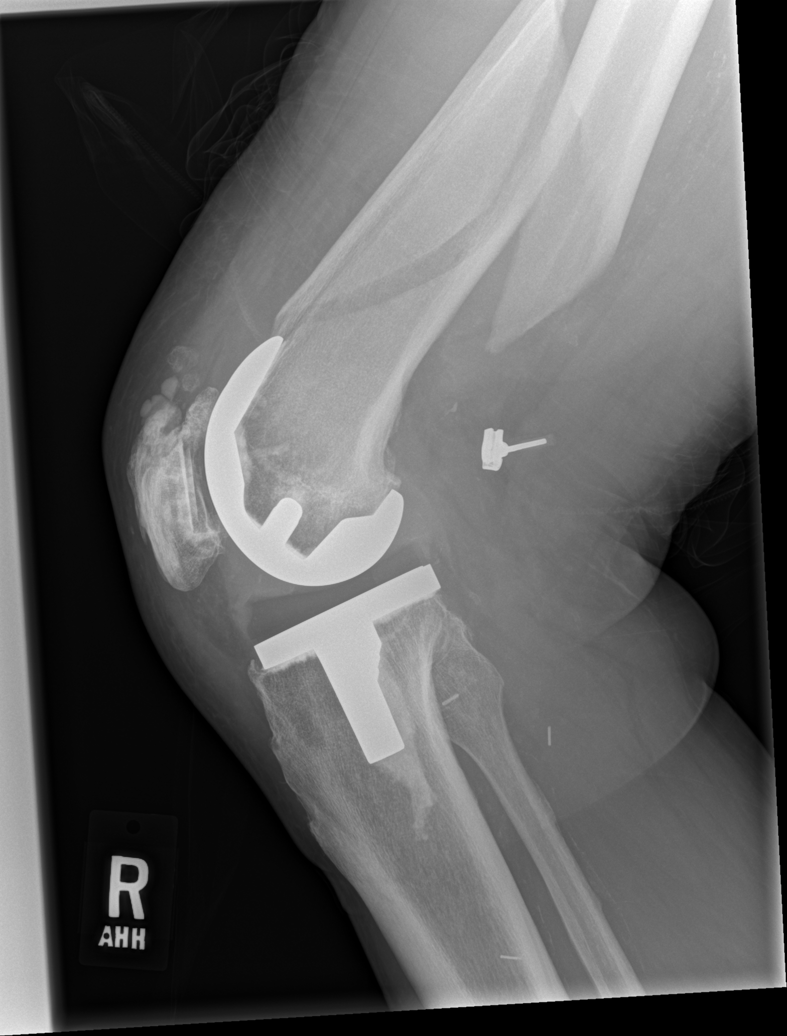

[x femur distal ap right (1 of 2)]
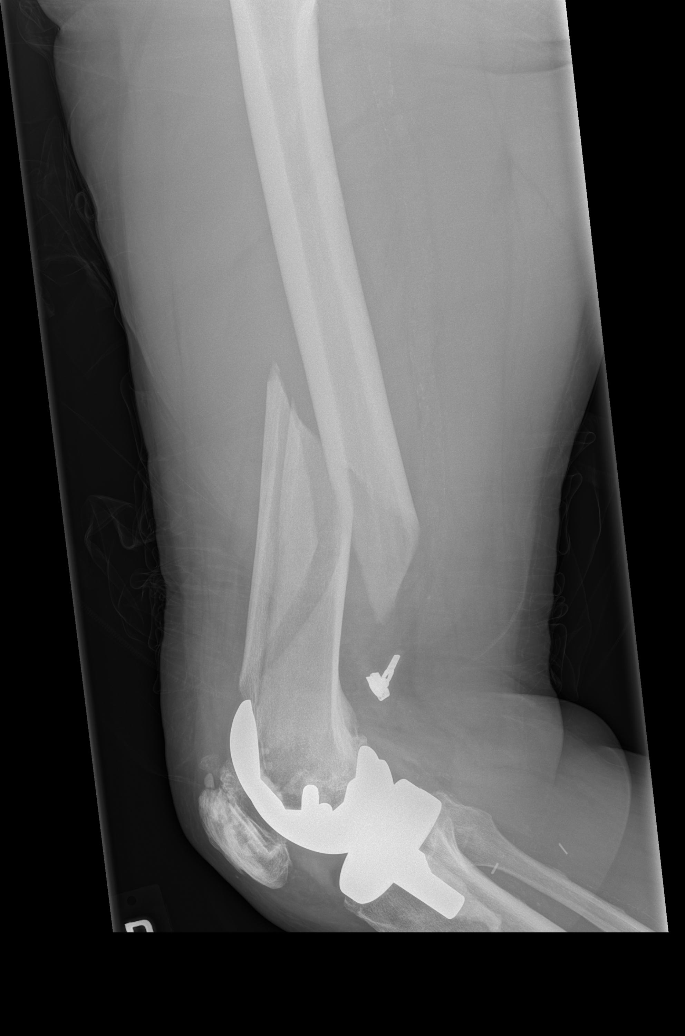

[x femur distal ap right (2 of 2)]
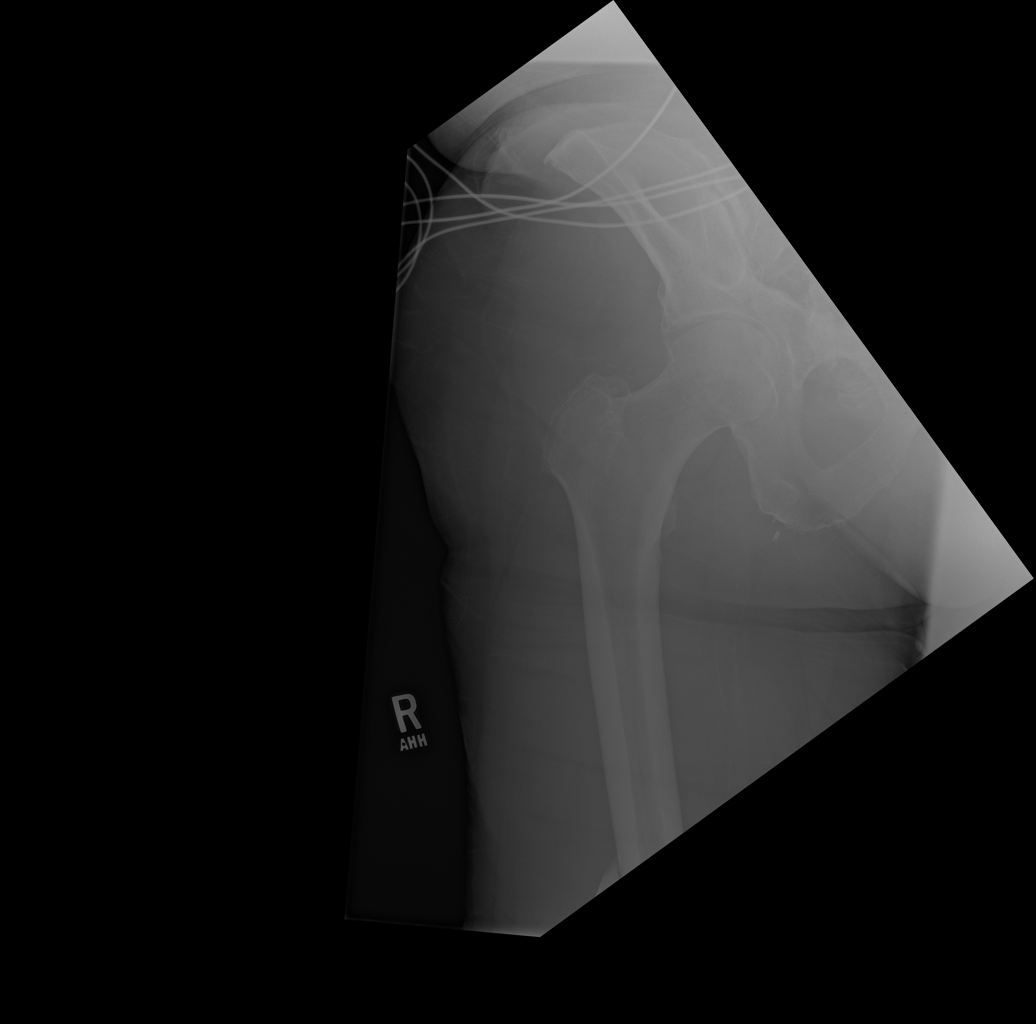

[w hip lat right (1 of 3)]
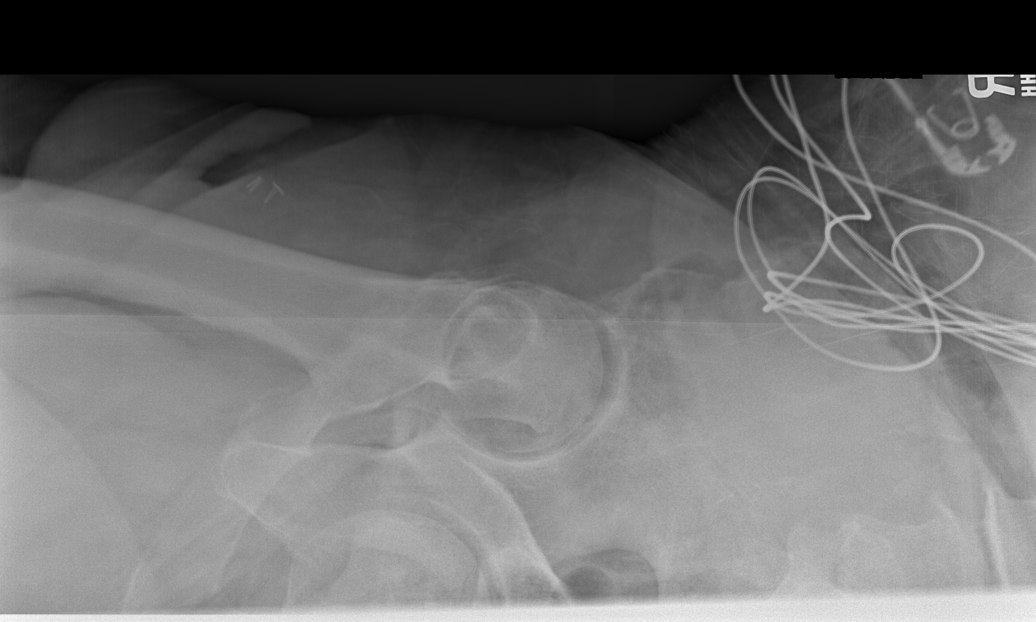

[w hip lat right (2 of 3)]
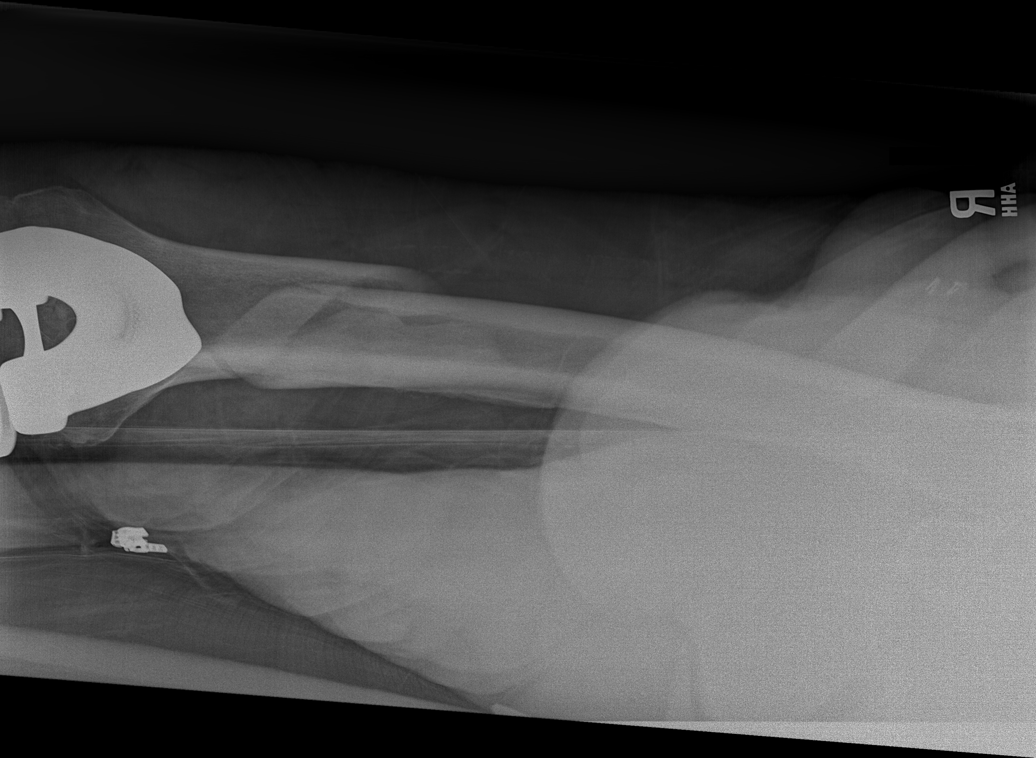

[w hip lat right (3 of 3)]
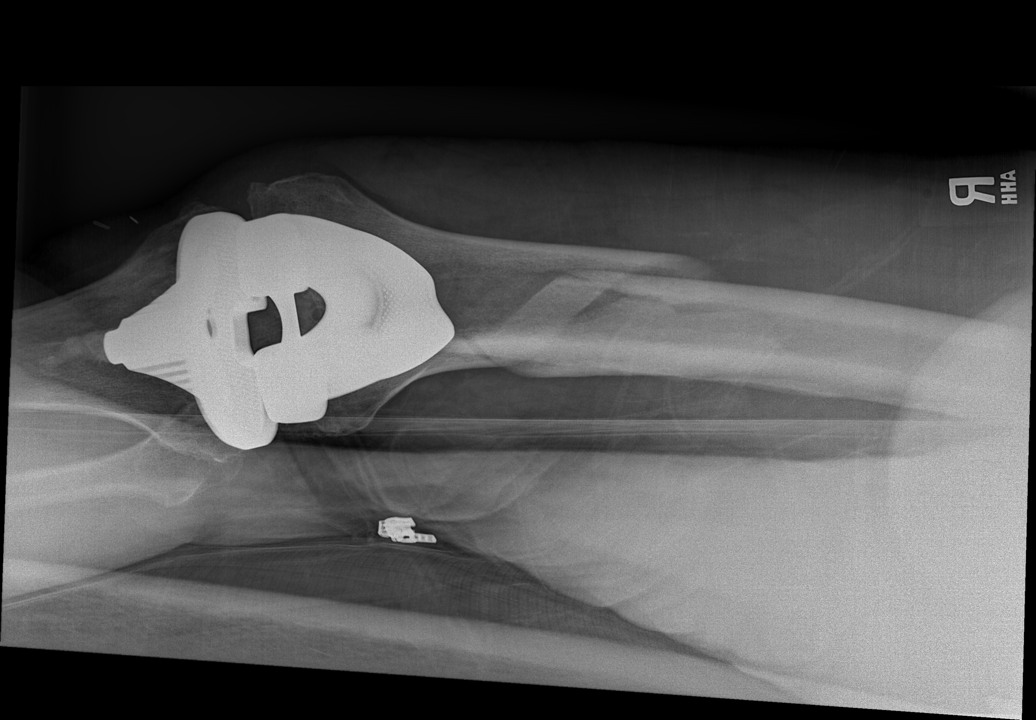

[6 of 6 positions shown; findings below may reference images not displayed]

FINDINGS: Right femoral head normally located. Proximal right femur appears
intact. Visible right hemipelvis grossly intact.

Previous right total knee arthroplasty. Comminuted spiral fracture
extending from about 14 cm proximal to the knee to the anterior
superior margin of the femoral hardware (arrow image 4). Anterior
displacement 1 full shaft width. Moderate anterior angulation. Over
riding of fracture fragments up to 4 cm. Mild medial displacement
and lateral angulation. Anterior displacement of a large butterfly
fragment.

Incidental external zipper artifact along the lateral knee.

The knee arthroplasty components remain normally aligned. Dystrophic
calcification about the patella. There does appear to be hemorrhage
within the joint. Grossly intact proximal tibia and fibula.
IMPRESSION: 1. Long segment comminuted spiral fracture involving the distal
third right femoral shaft and extending to the proximal margin of
the right knee femoral implant. Large butterfly fragment.
2. One full shaft wdith anterior displacement with 4 cm of
overriding, and anterior as well as medial angulation.
3. Suggestion of right knee hemarthrosis, but the knee hardware
appears normally aligned.
4. Proximal right femur intact.

## 2017-07-14 DIAGNOSIS — N3941 Urge incontinence: Secondary | ICD-10-CM | POA: Diagnosis not present

## 2017-07-14 DIAGNOSIS — N401 Enlarged prostate with lower urinary tract symptoms: Secondary | ICD-10-CM | POA: Diagnosis not present

## 2017-07-14 DIAGNOSIS — R3915 Urgency of urination: Secondary | ICD-10-CM | POA: Diagnosis not present

## 2017-08-03 DIAGNOSIS — F0150 Vascular dementia without behavioral disturbance: Secondary | ICD-10-CM | POA: Diagnosis not present

## 2017-09-01 DIAGNOSIS — Z9189 Other specified personal risk factors, not elsewhere classified: Secondary | ICD-10-CM | POA: Diagnosis not present

## 2017-09-11 DIAGNOSIS — H2513 Age-related nuclear cataract, bilateral: Secondary | ICD-10-CM | POA: Diagnosis not present

## 2017-10-13 DIAGNOSIS — F0150 Vascular dementia without behavioral disturbance: Secondary | ICD-10-CM | POA: Diagnosis not present

## 2017-10-13 DIAGNOSIS — I10 Essential (primary) hypertension: Secondary | ICD-10-CM | POA: Diagnosis not present

## 2017-10-13 DIAGNOSIS — I2510 Atherosclerotic heart disease of native coronary artery without angina pectoris: Secondary | ICD-10-CM | POA: Diagnosis not present

## 2017-10-13 DIAGNOSIS — N3281 Overactive bladder: Secondary | ICD-10-CM | POA: Diagnosis not present

## 2017-10-13 DIAGNOSIS — I350 Nonrheumatic aortic (valve) stenosis: Secondary | ICD-10-CM | POA: Diagnosis not present

## 2017-10-13 DIAGNOSIS — Z23 Encounter for immunization: Secondary | ICD-10-CM | POA: Diagnosis not present

## 2017-10-13 DIAGNOSIS — E785 Hyperlipidemia, unspecified: Secondary | ICD-10-CM | POA: Diagnosis not present

## 2017-10-13 DIAGNOSIS — M170 Bilateral primary osteoarthritis of knee: Secondary | ICD-10-CM | POA: Diagnosis not present

## 2017-10-13 DIAGNOSIS — Z0000 Encounter for general adult medical examination without abnormal findings: Secondary | ICD-10-CM | POA: Diagnosis not present

## 2017-10-13 DIAGNOSIS — K219 Gastro-esophageal reflux disease without esophagitis: Secondary | ICD-10-CM | POA: Diagnosis not present

## 2017-11-16 DIAGNOSIS — I429 Cardiomyopathy, unspecified: Secondary | ICD-10-CM

## 2017-11-16 HISTORY — DX: Cardiomyopathy, unspecified: I42.9

## 2017-12-13 ENCOUNTER — Telehealth: Payer: Self-pay | Admitting: Cardiology

## 2017-12-13 NOTE — Telephone Encounter (Signed)
LAM for patient son to call back to schedule appt

## 2017-12-14 DIAGNOSIS — I350 Nonrheumatic aortic (valve) stenosis: Secondary | ICD-10-CM | POA: Insufficient documentation

## 2017-12-14 HISTORY — DX: Nonrheumatic aortic (valve) stenosis: I35.0

## 2017-12-14 NOTE — Progress Notes (Signed)
Cardiology Office Note:    Date:  12/15/2017   ID:  [name], DOB [birth year], MRN [id number]  PCP:  Dineen Kid, MD  Cardiologist:  Shirlee More, MD    Referring MD: Dineen Kid, MD    ASSESSMENT:    1. Coronary artery disease of native artery of native heart with stable angina pectoris ([place])   2. Nonrheumatic aortic valve stenosis   3. Essential hypertension   4. Dyslipidemia   5. RBBB    PLAN:    In order of problems listed above:  1. Stable having no anginal discomfort New York Heart Association class I continue medical treatment with aspirin high intensity statin and at this time I would not advise an ischemia evaluation 2. Stable clinically recheck echocardiogram if develops severe symptomatic aortic stenosis would be a good candidate for TAVR 3. Stable blood pressure target continue current treatment 4. Stable continue his statin will ask his labs from his PCP outside of our electronic medical record 5. Stable EKG pattern he has PVCs on EKG but takes 2 drugs with potential for bradycardia for dementia and I would not place him on antiarrhythmic drugs or beta-blocker being asymptomatic and normal left ventricular ejection fraction.   Next appointment: One year or sooner symptomatic   Medication Adjustments/Labs and Tests Ordered: Current medicines are reviewed at length with the patient today.  Concerns regarding medicines are outlined above.  No orders of the defined types were placed in this encounter.  No orders of the defined types were placed in this encounter.   Chief Complaint  Patient presents with  . Follow-up  . Coronary Artery Disease  . Aortic Stenosis  . Hypertension  . Hyperlipidemia    History of Present Illness:    [name] is a 84 y.o. male with a hx of CAD CABG 03/24/2019aortic stenosis hypertension and dyslipidemia  last seen cables around by Dr. Wynonia Lawman 10/26/2016.  Echocardiogram at that time showed mild to moderate aortic  stenosis peak and mean gradients 3419 valve area 1.05 there is also moderate aortic moderate mitral and moderate tricuspid regurgitation ejection fraction was low normal 50% with moderate LVH. Compliance with diet, lifestyle and medications: Yes  Son is involved in his care he struggled after his total knee replacement and fall with fracture of his femur on the other side.  He has had no chest pain dyspnea syncope palpitation no bradycardia with his dementia medications tolerates his statin without neuromuscular toxicity is pleased with the quality of his life Past Medical History:  Diagnosis Date  . Aortic valve stenosis 12/14/2017  . BPH (benign prostatic hyperplasia) 06/18/2015  . CAD (coronary artery disease)   . Cancer ([place])    hx of bladder cancer- 2002   . Dementia due to general medical condition without behavioral disturbance ([place])    unspecified (per wife) [facility] at [facility]  . Dyslipidemia 09/17/2012  . Erectile dysfunction 09/17/2012  . Essential and other specified forms of tremor 07/15/2012  . HTN (hypertension) 01/30/2013  . Hx of bladder cancer 02/25/2016  . Laryngopharyngeal reflux (LPR) 05/28/2016  . Mobitz type 2 second degree atrioventricular block   . PAD (peripheral artery disease) ([place]) 09/17/2012  . Prostatism 09/17/2012  . RBBB   . S/P TKR (total knee replacement) using cement 05/08/2015  . Second degree AV block, Mobitz type I 09/17/2012  . Senile osteoporosis 02/25/2016  . Urinary frequency   . Vascular dementia without behavioral disturbance ([place]) 12/19/2014    Past Surgical History:  Procedure Laterality Date  . CARDIAC CATHETERIZATION  04/27/2007   3 vessel disease  . CORONARY ANGIOPLASTY  1988  . CORONARY ANGIOPLASTY WITH STENT PLACEMENT  1998   LAD  . CORONARY ARTERY BYPASS GRAFT  05/10/2007   LIMA to LAD, SVG to 1st and 2nd OM and SVG to RCA  . CYSTOSCOPY WITH INSERTION OF UROLIFT N/A 10/12/2016   Procedure: CYSTOSCOPY WITH INSERTION OF UROLIFT;  Surgeon: Franchot Gallo, MD;  Location: [facility];  Service: Urology;  Laterality: N/A;  . FEMUR IM NAIL Right 05/19/2015   Procedure: INTRAMEDULLARY (IM) RETROGRADE FEMORAL NAILING with ZIMMER CABLES;  Surgeon: Rod Can, MD;  Location: [facility];  Service: Orthopedics;  Laterality: Right;  . JOINT REPLACEMENT Left 05/08/2015  . KNEE SURGERY Right 2006  . NM MYOCAR PERF WALL MOTION  04/20/2007   mod. reversible ischemia  . TOTAL KNEE ARTHROPLASTY Left 05/08/2015   Procedure: LEFT TOTAL KNEE ARTHROPLASTY;  Surgeon: Latanya Maudlin, MD;  Location: [facility];  Service: Orthopedics;  Laterality: Left;  . US ECHOCARDIOGRAPHY  10/01/2010   mild concentric LVH,LA mildly dilated,RA mild to mod. dilated,mild MR,mild to mod. TR,mild AI,mild PI,sinus rhythm w/pauses    Current Medications: Current Meds  Medication Sig  . acetaminophen (TYLENOL) 325 MG tablet Take 2 tablets (650 mg total) by mouth every 6 (six) hours as needed for mild pain (or Fever >/= 101).  Marland Kitchen amLODipine (NORVASC) 2.5 MG tablet Take 2.5 mg by mouth daily.   Marland Kitchen aspirin EC 81 MG tablet Take 81 mg by mouth daily.  Marland Kitchen atorvastatin (LIPITOR) 20 MG tablet Take 20 mg by mouth at bedtime.   . Calcium Carb-Cholecalciferol (CALCIUM-VITAMIN D) 500-200 MG-UNIT tablet Take 1 tablet by mouth daily.  Marland Kitchen donepezil (ARICEPT) 5 MG tablet Take 5 mg by mouth at bedtime.   Marland Kitchen HYDROcodone-acetaminophen (NORCO/VICODIN) 5-325 MG tablet Take 1-2 tablets by mouth every 4 (four) hours as needed for moderate pain.  . Magnesium 300 MG CAPS Take 1 capsule by mouth daily.   Marland Kitchen Specialty Vitamins Products (PROSTATE) TABS Take 1 tablet by mouth daily.  . vitamin E 200 UNIT capsule Take 200 Units by mouth daily.     Allergies:   Metoprolol   Social History   Socioeconomic History  . Marital status: Widowed    Spouse name: Not on file  . Number of children: Not on file  . Years of education: Not on file  . Highest education level: Not on file  Occupational History    . Not on file  Social Needs  . Financial resource strain: Not on file  . Food insecurity:    Worry: Not on file    Inability: Not on file  . Transportation needs:    Medical: Not on file    Non-medical: Not on file  Tobacco Use  . Smoking status: Never Smoker  . Smokeless tobacco: Never Used  Substance and Sexual Activity  . Alcohol use: No  . Drug use: No  . Sexual activity: Not on file  Lifestyle  . Physical activity:    Days per week: Not on file    Minutes per session: Not on file  . Stress: Not on file  Relationships  . Social connections:    Talks on phone: Not on file    Gets together: Not on file    Attends religious service: Not on file    Active member of club or organization: Not on file    Attends meetings of  clubs or organizations: Not on file    Relationship status: Not on file  Other Topics Concern  . Not on file  Social History Narrative  . Not on file     Family History: The patient's family history includes Alzheimer's disease in his mother; Cancer in his brother and father; Pneumonia in his mother. ROS:   Please see the history of present illness.    All other systems reviewed and are negative.  EKGs/Labs/Other Studies Reviewed:    The following studies were reviewed today:  EKG:  EKG ordered today.  The ekg ordered today demonstrates [name] RBBB and PVC's  Recent Labs: 04/12/2017: ALT 12; BUN 17; Creatinine, Ser 1.20; Hemoglobin 13.9; Platelets 155; Potassium 4.6; Sodium 143  Recent Lipid Panel    Component Value Date/Time   CHOL 156 12/21/2014 1119   TRIG 81 12/21/2014 1119   HDL 66 12/21/2014 1119   CHOLHDL 2.4 12/21/2014 1119   VLDL 16 12/21/2014 1119   LDLCALC 74 12/21/2014 1119    Physical Exam:    VS:  There were no vitals taken for this visit.    Wt Readings from Last 3 Encounters:  10/12/16 165 lb (74.8 kg)  06/18/15 165 lb 6.4 oz (75 kg)  06/10/15 178 lb (80.7 kg)     GEN:  Well nourished, well developed in no acute  distress HEENT: Normal NECK: No JVD; No carotid bruits LYMPHATICS: No lymphadenopathy CARDIAC: 2/6 SEM aortic area s2 split no ARRRR, no murmurs, rubs, gallops RESPIRATORY:  Clear to auscultation without rales, wheezing or rhonchi  ABDOMEN: Soft, non-tender, non-distended MUSCULOSKELETAL:  No edema; No deformity  SKIN: Warm and dry NEUROLOGIC:  Alert and oriented x 3 PSYCHIATRIC:  Normal affect    Signed, Shirlee More, MD  12/15/2017 8:37 AM    [place]

## 2017-12-15 ENCOUNTER — Encounter: Payer: Self-pay | Admitting: Cardiology

## 2017-12-15 ENCOUNTER — Encounter: Payer: Self-pay | Admitting: *Deleted

## 2017-12-15 ENCOUNTER — Ambulatory Visit (INDEPENDENT_AMBULATORY_CARE_PROVIDER_SITE_OTHER): Payer: Medicare Other | Admitting: Cardiology

## 2017-12-15 VITALS — BP 132/64 | HR 75 | Ht 67.0 in | Wt 148.8 lb

## 2017-12-15 DIAGNOSIS — I4510 Unspecified right bundle-branch block: Secondary | ICD-10-CM

## 2017-12-15 DIAGNOSIS — I10 Essential (primary) hypertension: Secondary | ICD-10-CM | POA: Diagnosis not present

## 2017-12-15 DIAGNOSIS — I350 Nonrheumatic aortic (valve) stenosis: Secondary | ICD-10-CM

## 2017-12-15 DIAGNOSIS — I25118 Atherosclerotic heart disease of native coronary artery with other forms of angina pectoris: Secondary | ICD-10-CM

## 2017-12-15 DIAGNOSIS — E785 Hyperlipidemia, unspecified: Secondary | ICD-10-CM

## 2017-12-15 NOTE — Patient Instructions (Signed)
Medication Instructions:  Your physician recommends that you continue on your current medications as directed. Please refer to the Current Medication list given to you today.  If you need a refill on your cardiac medications before your next appointment, please call your pharmacy.   Lab work: None  If you have labs (blood work) drawn today and your tests are completely normal, you will receive your results only by: Marland Kitchen MyChart Message (if you have MyChart) OR . A paper copy in the mail If you have any lab test that is abnormal or we need to change your treatment, we will call you to review the results.  Testing/Procedures: You had an EKG today.   Your physician has requested that you have an echocardiogram. Echocardiography is a painless test that uses sound waves to create images of your heart. It provides your doctor with information about the size and shape of your heart and how well your heart's chambers and valves are working. This procedure takes approximately one hour. There are no restrictions for this procedure.  Follow-Up: At [facility], you and your health needs are our priority.  As part of our continuing mission to provide you with exceptional heart care, we have created designated Provider Care Teams.  These Care Teams include your primary Cardiologist (physician) and Advanced Practice Providers (APPs -  Physician Assistants and Nurse Practitioners) who all work together to provide you with the care you need, when you need it.   Your physician recommends that you schedule a follow-up appointment in 1 year with Dr. Wynonia Lawman.     Echocardiogram An echocardiogram, or echocardiography, uses sound waves (ultrasound) to produce an image of your heart. The echocardiogram is simple, painless, obtained within a short period of time, and offers valuable information to your health care provider. The images from an echocardiogram can provide information such as:  Evidence of coronary  artery disease (CAD).  Heart size.  Heart muscle function.  Heart valve function.  Aneurysm detection.  Evidence of a past heart attack.  Fluid buildup around the heart.  Heart muscle thickening.  Assess heart valve function.  Tell a health care provider about:  Any allergies you have.  All medicines you are taking, including vitamins, herbs, eye drops, creams, and over-the-counter medicines.  Any problems you or family members have had with anesthetic medicines.  Any blood disorders you have.  Any surgeries you have had.  Any medical conditions you have.  Whether you are pregnant or may be pregnant. What happens before the procedure? No special preparation is needed. Eat and drink normally. What happens during the procedure?  In order to produce an image of your heart, gel will be applied to your chest and a wand-like tool (transducer) will be moved over your chest. The gel will help transmit the sound waves from the transducer. The sound waves will harmlessly bounce off your heart to allow the heart images to be captured in real-time motion. These images will then be recorded.  You may need an IV to receive a medicine that improves the quality of the pictures. What happens after the procedure? You may return to your normal schedule including diet, activities, and medicines, unless your health care provider tells you otherwise. This information is not intended to replace advice given to you by your health care provider. Make sure you discuss any questions you have with your health care provider. Document Released: 01/31/2000 Document Revised: 09/21/2015 Document Reviewed: 10/10/2012 Elsevier Interactive Patient Education  2017 Elsevier  Inc.

## 2017-12-22 ENCOUNTER — Ambulatory Visit: Payer: Medicare Other | Admitting: Cardiology

## 2017-12-24 ENCOUNTER — Ambulatory Visit (HOSPITAL_BASED_OUTPATIENT_CLINIC_OR_DEPARTMENT_OTHER)
Admission: RE | Admit: 2017-12-24 | Discharge: 2017-12-24 | Disposition: A | Discharge status: Home / Self Care | Payer: Medicare Other | Source: Ambulatory Visit | Attending: Cardiology | Admitting: Cardiology

## 2017-12-24 DIAGNOSIS — I25118 Atherosclerotic heart disease of native coronary artery with other forms of angina pectoris: Secondary | ICD-10-CM | POA: Insufficient documentation

## 2017-12-24 DIAGNOSIS — I350 Nonrheumatic aortic (valve) stenosis: Secondary | ICD-10-CM

## 2017-12-24 NOTE — Progress Notes (Signed)
  Echocardiogram 2D Echocardiogram has been performed.  Jacquelina Hewins T Deeksha Cotrell 12/24/2017, 3:24 PM

## 2017-12-29 ENCOUNTER — Telehealth: Payer: Self-pay | Admitting: *Deleted

## 2017-12-29 NOTE — Telephone Encounter (Signed)
Spoke with patient and he is aware of appointment with Dr Bettina Gavia on 01-06-18 at 3:20pm.  Patient agreed to plan and verbalized understanding.

## 2017-12-29 NOTE — Telephone Encounter (Signed)
Pt phoned today asking about future appt with Dr. Bettina Gavia. I let him know there are no appts on the books. Looked in chart and see a 1 year recall for our doctors but when I look in the result notes on Echo I see every 6 months with Dr. Oran Rein. Do we need to change the recall to 6 months? I just want to make sure the pt gets a letter when it is time for him to be seen again, he is forgetful and gets confused easily. Thank you

## 2018-01-05 NOTE — Progress Notes (Signed)
Cardiology Office Note:    Date:  01/06/2018   ID:  [name], DOB [birth year], MRN [id number]  PCP:  Dineen Kid, MD  Cardiologist:  Shirlee More, MD    Referring MD: Dineen Kid, MD    ASSESSMENT:    1. Nonrheumatic aortic valve stenosis    PLAN:    In order of problems listed above:  1. Return to my office regarding aortic stenosis recent echocardiogram shows moderate to severe low output paradoxical aortic stenosis.  His son did not come with him he is not symptomatic and at this time I would take a watchful waiting approach recheck echocardiogram in May and follow-up in the office and contact me if he is having chest pain shortness of breath or syncope and I hand wrote a nice note with these instructions from the shoulder with his son.   Next appointment: 6 months   Medication Adjustments/Labs and Tests Ordered: Current medicines are reviewed at length with the patient today.  Concerns regarding medicines are outlined above.  No orders of the defined types were placed in this encounter.  No orders of the defined types were placed in this encounter.   Chief Complaint  Patient presents with  . Aortic Stenosis    History of Present Illness:    [name] is a 84 y.o. male with a hx of CAD CABG 05/09/2017 aortic stenosis hypertension and dyslipidemia    last seen 12/15/17.  His echocardiogram subsequently showed aortic stenosis with peak and mean gradients of 39 and 23 mmHg however the ratio time velocity interval and velocities are both less than 0.25 calculated valve area less than 1 centimeter consistent with aortic stenosis low flow with a stroke volume index of 30 and a normal left ventricular ejection fraction. Compliance with diet, lifestyle and medications: Yes  He is seen today in follow-up to his echocardiogram.  He is very frail but is not having symptomatic aortic stenosis and at this time I would not pursue hemodynamics RA aortic valve calcium  score months we are committed to intervention.  Unfortunately his concern today is for me in some fashion to assist him in regaining a driver's license.  I listen to him I explained to him how painful I know this is for him and strongly encouraged him to follow the advice of his family doctor and his son and told him that I would not encourage him to be driving.  He is very disappointed.  I explained to him the natural history of aortic stenosis the indications for intervention necessity of follow-up echocardiogram and if necessary TAVR is the preferred mechanism.  He seems to understand and I hand wrote a note for him to take home to his son and will be scheduled in May for follow-up echocardiogram to be seen in the office afterwards and contact me if he is developing symptoms.  His other medical problems including CAD hypertension appears stable he will continue his current medical treatments Past Medical History:  Diagnosis Date  . Aortic valve stenosis 12/14/2017  . BPH (benign prostatic hyperplasia) 06/18/2015  . CAD (coronary artery disease)   . Cancer ([place])    hx of bladder cancer- 2002   . Dementia due to general medical condition without behavioral disturbance ([place])    unspecified (per wife) dx at [facility]  . Dyslipidemia 09/17/2012  . Erectile dysfunction 09/17/2012  . Essential and other specified forms of tremor 07/15/2012  . HTN (hypertension) 01/30/2013  . Hx of bladder  cancer 02/25/2016  . Laryngopharyngeal reflux (LPR) 05/28/2016  . Mobitz type 2 second degree atrioventricular block   . PAD (peripheral artery disease) ([place]) 09/17/2012  . Prostatism 09/17/2012  . RBBB   . S/P TKR (total knee replacement) using cement 05/08/2015  . Second degree AV block, Mobitz type I 09/17/2012  . Senile osteoporosis 02/25/2016  . Urinary frequency   . Vascular dementia without behavioral disturbance ([place]) 12/19/2014    Past Surgical History:  Procedure Laterality Date  . CARDIAC CATHETERIZATION  04/27/2007    3 vessel disease  . CORONARY ANGIOPLASTY  1988  . CORONARY ANGIOPLASTY WITH STENT PLACEMENT  1998   LAD  . CORONARY ARTERY BYPASS GRAFT  05/10/2007   LIMA to LAD, SVG to 1st and 2nd OM and SVG to RCA  . CYSTOSCOPY WITH INSERTION OF UROLIFT N/A 10/12/2016   Procedure: CYSTOSCOPY WITH INSERTION OF UROLIFT;  Surgeon: Franchot Gallo, MD;  [facility]: [facility];  Service: Urology;  Laterality: N/A;  . FEMUR IM NAIL Right 05/19/2015   Procedure: INTRAMEDULLARY (IM) RETROGRADE FEMORAL NAILING with ZIMMER CABLES;  Surgeon: Rod Can, MD;  [facility];  Service: Orthopedics;  Laterality: Right;  . JOINT REPLACEMENT Left 05/08/2015  . KNEE SURGERY Right 2006  . NM MYOCAR PERF WALL MOTION  04/20/2007   mod. reversible ischemia  . TOTAL KNEE ARTHROPLASTY Left 05/08/2015   Procedure: LEFT TOTAL KNEE ARTHROPLASTY;  Surgeon: Latanya Maudlin, MD;  Location[facility];  Service: Orthopedics;  Laterality: Left;  . US ECHOCARDIOGRAPHY  10/01/2010   mild concentric LVH,LA mildly dilated,RA mild to mod. dilated,mild MR,mild to mod. TR,mild AI,mild PI,sinus rhythm w/pauses    Current Medications: Current Meds  Medication Sig  . acetaminophen (TYLENOL) 325 MG tablet Take 2 tablets (650 mg total) by mouth every 6 (six) hours as needed for mild pain (or Fever >/= 101).  Marland Kitchen amLODipine (NORVASC) 2.5 MG tablet Take 2.5 mg by mouth daily.   Marland Kitchen aspirin EC 81 MG tablet Take 81 mg by mouth daily.  Marland Kitchen atorvastatin (LIPITOR) 20 MG tablet Take 20 mg by mouth at bedtime.   . Calcium Carb-Cholecalciferol (CALCIUM-VITAMIN D) 500-200 MG-UNIT tablet Take 1 tablet by mouth daily.  Marland Kitchen donepezil (ARICEPT) 5 MG tablet Take 5 mg by mouth at bedtime.   Marland Kitchen HYDROcodone-acetaminophen (NORCO/VICODIN) 5-325 MG tablet Take 1-2 tablets by mouth every 4 (four) hours as needed for moderate pain.  . Magnesium 300 MG CAPS Take 1 capsule by mouth daily.   Marland Kitchen Specialty Vitamins Products (PROSTATE) TABS Take 1 tablet by mouth daily.    . vitamin E 200 UNIT capsule Take 200 Units by mouth daily.     Allergies:   Metoprolol   Social History   Socioeconomic History  . Marital status: Widowed    Spouse name: Not on file  . Number of children: Not on file  . Years of education: Not on file  . Highest education level: Not on file  Occupational History  . Not on file  Social Needs  . Financial resource strain: Not on file  . Food insecurity:    Worry: Not on file    Inability: Not on file  . Transportation needs:    Medical: Not on file    Non-medical: Not on file  Tobacco Use  . Smoking status: Never Smoker  . Smokeless tobacco: Never Used  Substance and Sexual Activity  . Alcohol use: No  . Drug use: No  . Sexual activity: Not on file  Lifestyle  . Physical activity:    Days per week: Not on file    Minutes per session: Not on file  . Stress: Not on file  Relationships  . Social connections:    Talks on phone: Not on file    Gets together: Not on file    Attends religious service: Not on file    Active member of club or organization: Not on file    Attends meetings of clubs or organizations: Not on file    Relationship status: Not on file  Other Topics Concern  . Not on file  Social History Narrative  . Not on file     Family History: The patient's family history includes Alzheimer's disease in his mother; Cancer in his brother and father; Pneumonia in his mother. ROS:   Please see the history of present illness.    All other systems reviewed and are negative.  EKGs/Labs/Other Studies Reviewed:    The following studies were reviewed today:  Recent Labs: 04/12/2017: ALT 12; BUN 17; Creatinine, Ser 1.20; Hemoglobin 13.9; Platelets 155; Potassium 4.6; Sodium 143  Recent Lipid Panel    Component Value Date/Time   CHOL 156 12/21/2014 1119   TRIG 81 12/21/2014 1119   HDL 66 12/21/2014 1119   CHOLHDL 2.4 12/21/2014 1119   VLDL 16 12/21/2014 1119   LDLCALC 74 12/21/2014 1119    Physical  Exam:    VS:  BP 128/70 (BP Location: Left Arm, Patient Position: Sitting, Cuff Size: Normal)   Pulse 71   Ht 5\' 7"  (1.702 m)   Wt 149 lb 12.8 oz (67.9 kg)   SpO2 98%   BMI 23.46 kg/m     Wt Readings from Last 3 Encounters:  01/06/18 149 lb 12.8 oz (67.9 kg)  12/15/17 148 lb 12.8 oz (67.5 kg)  10/12/16 165 lb (74.8 kg)     GEN: Frail Well nourished, well developed in no acute distress HEENT: Normal NECK: No JVD; No carotid bruits LYMPHATICS: No lymphadenopathy CARDIAC: 3/6 AS to the carotids harsh late peak and single S2 RESPIRATORY:  Clear to auscultation without rales, wheezing or rhonchi  ABDOMEN: Soft, non-tender, non-distended MUSCULOSKELETAL:  No edema; No deformity  SKIN: Warm and dry NEUROLOGIC:  Alert and oriented x 3 PSYCHIATRIC:  Normal affect    Signed, Shirlee More, MD  01/06/2018 3:52 PM    [facility]

## 2018-01-06 ENCOUNTER — Encounter: Payer: Self-pay | Admitting: Cardiology

## 2018-01-06 ENCOUNTER — Ambulatory Visit (INDEPENDENT_AMBULATORY_CARE_PROVIDER_SITE_OTHER): Payer: Medicare Other | Admitting: Cardiology

## 2018-01-06 VITALS — BP 128/70 | HR 71 | Ht 67.0 in | Wt 149.8 lb

## 2018-01-06 DIAGNOSIS — I350 Nonrheumatic aortic (valve) stenosis: Secondary | ICD-10-CM | POA: Diagnosis not present

## 2018-01-06 NOTE — Patient Instructions (Addendum)
Medication Instructions:  Your physician recommends that you continue on your current medications as directed. Please refer to the Current Medication list given to you today.  If you need a refill on your cardiac medications before your next appointment, please call your pharmacy.   Lab work: None  If you have labs (blood work) drawn today and your tests are completely normal, you will receive your results only by: Marland Kitchen MyChart Message (if you have MyChart) OR . A paper copy in the mail If you have any lab test that is abnormal or we need to change your treatment, we will call you to review the results.  Testing/Procedures: Your physician has requested that you have an echocardiogram. Echocardiography is a painless test that uses sound waves to create images of your heart. It provides your doctor with information about the size and shape of your heart and how well your heart's chambers and valves are working. This procedure takes approximately one hour. There are no restrictions for this procedure. This will be scheduled for May 2020.   Follow-Up: At [facility], you and your health needs are our priority.  As part of our continuing mission to provide you with exceptional heart care, we have created designated Provider Care Teams.  These Care Teams include your primary Cardiologist (physician) and Advanced Practice Providers (APPs -  Physician Assistants and Nurse Practitioners) who all work together to provide you with the care you need, when you need it. You will need a follow up appointment in June 2020 with Dr. Wynonia Lawman.      Aortic Valve Stenosis Aortic valve stenosis is a narrowing of the aortic valve. The aortic valve opens and closes to regulate blood flow between the lower left chamber of the heart (left ventricle) and the blood vessel that leads away from the heart (aorta). When the aortic valve becomes narrow, it makes it difficult for the heart to pump blood into the aorta, which  causes the heart to work harder. The extra work can weaken the heart over time. Aortic valve stenosis can range from mild to severe. If untreated, it can become more severe over time and can lead to heart failure. What are the causes? This condition may be caused by:  Buildup of calcium around and on the valve. This can occur with aging. This is the most common cause of aortic valve stenosis.  Birth defect.  Rheumatic fever.  Radiation to the chest.  What increases the risk? You may be more likely to develop this condition if:  You are over the age of 73.  You were born with an abnormal bicuspid valve.  What are the signs or symptoms? You may have no symptoms until your condition becomes severe. It may take 10-20 years for mild or moderate aortic valve stenosis to become severe. Symptoms may include:  Shortness of breath. This may get worse during physical activity.  Feeling unusually weak and tired (fatigue).  Extreme discomfort in the chest, neck, or arm (angina).  A heartbeat that is irregular or faster than normal (palpitations).  Dizziness or fainting. This may happen when you get physically tired or after you take certain heart medicines, such as nitroglycerin.  How is this diagnosed? This condition may be diagnosed with:  A physical exam.  Echocardiogram. This is a type of imaging test that uses sound waves (ultrasound) to make an image of your heart. There are two types that may be used: ? Transthoracic echocardiogram (TTE). This type of echocardiogram is  noninvasive, and it is usually done first. ? Transesophageal echocardiogram (TEE). This type of echocardiogram is done by passing a flexible tube down your esophagus. The heart and the esophagus are close to each other, so your health care provider can take very clear, detailed pictures of the heart using this type of test.  Cardiac catheterization. In this procedure, a thin, flexible tube (catheter) is passed  through a large vein in your neck, groin, or arm. This procedure provides information about arteries, structures, blood pressure, and oxygen levels in your heart.  Electrocardiogram (ECG). This records the electrical impulses of your heart and assesses heart function.  Stress tests. These are tests that evaluate the blood supply to your heart and your heart's response to exercise.  Blood tests.  You may work with a health care provider who specializes in the heart (cardiologist). How is this treated? Treatment depends on how severe your condition is and what your symptoms are. You will need to have your heart checked regularly to make sure that your condition is not getting worse or causing serious problems. If your condition is mild, no treatment may be needed. Treatment may include:  Medicines that help keep your heart rate regular.  Medicines that thin your blood (anticoagulants) to prevent the formation of blood clots.  Antibiotic medicines to help prevent infection.  Surgery to replace your aortic valve. This is the most common treatment for aortic valve stenosis. Several types of surgeries are available. The surgery may be done through a large incision over your heart (open heart surgery), or it may be done using a minimally invasive technique (transcatheter aortic valve replacement, or TAVR).  Follow these instructions at home: Lifestyle   Limit alcohol intake to no more than 1 drink per day for nonpregnant women and 2 drinks per day for men. One drink equals 12 oz of beer, 5 oz of wine, or 1 oz of hard liquor.  Do not use any tobacco products, such as cigarettes, chewing tobacco, or e-cigarettes. If you need help quitting, ask your health care provider.  Work with your health care provider to manage your blood pressure and cholesterol.  Maintain a healthy weight. Eating and drinking  Follow instructions from your health care provider about eating or drinking  restrictions. ? Limit how much caffeine you drink. Caffeine can affect your heart's rate and rhythm.  Drink enough fluid to keep your urine clear or pale yellow.  Eat a heart-healthy diet. This should include plenty of fresh fruits and vegetables. If you eat meat, it should be lean cuts. Avoid foods that are: ? High in salt, saturated fat, or sugar. ? Canned or highly processed. ? Fried. Activity  Return to your normal activities as told by your health care provider. Ask your health care provider what activities are safe for you.  Exercise regularly, as told by your health care provider. Ask your health care provider what types of exercise are safe for you.  If your aortic valve stenosis is mild, you may need to avoid only very intense physical activity. The more severe your aortic valve stenosis is, the more activities you may need to avoid. General instructions  Take over-the-counter and prescription medicines only as told by your health care provider.  If you are a woman and you plan to become pregnant, talk with your health care provider before you become pregnant.  Tell all health care providers who care for you that you have aortic valve stenosis.  Keep all follow-up visits  as told by your health care provider. This is important. Contact a health care provider if:  You have a fever. Get help right away if:  You develop chest pain or tightness.  You develop shortness of breath or difficulty breathing.  You feel light-headed.  You feel like you might faint.  Your heartbeat is irregular or faster than normal. These symptoms may represent a serious problem that is an emergency. Do not wait to see if the symptoms will go away. Get medical help right away. Call your local emergency services (911 in the [place] Do not drive yourself to the hospital. This information is not intended to replace advice given to you by your health care provider. Make sure you discuss any questions  you have with your health care provider. Document Released: 11/01/2002 Document Revised: 07/11/2015 Document Reviewed: 01/06/2015 Elsevier Interactive Patient Education  2017 [employer].   Echocardiogram An echocardiogram, or echocardiography, uses sound waves (ultrasound) to produce an image of your heart. The echocardiogram is simple, painless, obtained within a short period of time, and offers valuable information to your health care provider. The images from an echocardiogram can provide information such as:  Evidence of coronary artery disease (CAD).  Heart size.  Heart muscle function.  Heart valve function.  Aneurysm detection.  Evidence of a past heart attack.  Fluid buildup around the heart.  Heart muscle thickening.  Assess heart valve function.  Tell a health care provider about:  Any allergies you have.  All medicines you are taking, including vitamins, herbs, eye drops, creams, and over-the-counter medicines.  Any problems you or family members have had with anesthetic medicines.  Any blood disorders you have.  Any surgeries you have had.  Any medical conditions you have.  Whether you are pregnant or may be pregnant. What happens before the procedure? No special preparation is needed. Eat and drink normally. What happens during the procedure?  In order to produce an image of your heart, gel will be applied to your chest and a wand-like tool (transducer) will be moved over your chest. The gel will help transmit the sound waves from the transducer. The sound waves will harmlessly bounce off your heart to allow the heart images to be captured in real-time motion. These images will then be recorded.  You may need an IV to receive a medicine that improves the quality of the pictures. What happens after the procedure? You may return to your normal schedule including diet, activities, and medicines, unless your health care provider tells you otherwise. This  information is not intended to replace advice given to you by your health care provider. Make sure you discuss any questions you have with your health care provider. Document Released: 01/31/2000 Document Revised: 09/21/2015 Document Reviewed: 10/10/2012 Elsevier Interactive Patient Education  2017 [employer].

## 2018-01-24 ENCOUNTER — Other Ambulatory Visit (HOSPITAL_COMMUNITY): Payer: Medicare Other

## 2018-02-01 ENCOUNTER — Encounter: Payer: Self-pay | Admitting: Cardiology

## 2018-02-02 ENCOUNTER — Telehealth: Payer: Self-pay

## 2018-02-02 NOTE — Telephone Encounter (Signed)
New message    Just an FYI. We have made several attempts to contact this patient including sending a letter to schedule or reschedule their echocardiogram. We will be removing the patient from the echo WQ.   Thank you 

## 2018-02-16 DIAGNOSIS — R0609 Other forms of dyspnea: Secondary | ICD-10-CM

## 2018-02-16 DIAGNOSIS — R0600 Dyspnea, unspecified: Secondary | ICD-10-CM

## 2018-02-16 HISTORY — DX: Other forms of dyspnea: R06.09

## 2018-02-16 HISTORY — DX: Dyspnea, unspecified: R06.00

## 2018-03-14 DIAGNOSIS — N433 Hydrocele, unspecified: Secondary | ICD-10-CM | POA: Diagnosis not present

## 2018-03-14 DIAGNOSIS — N401 Enlarged prostate with lower urinary tract symptoms: Secondary | ICD-10-CM | POA: Diagnosis not present

## 2018-04-18 DIAGNOSIS — N401 Enlarged prostate with lower urinary tract symptoms: Secondary | ICD-10-CM | POA: Diagnosis not present

## 2018-05-03 DIAGNOSIS — N401 Enlarged prostate with lower urinary tract symptoms: Secondary | ICD-10-CM | POA: Diagnosis not present

## 2018-05-23 ENCOUNTER — Ambulatory Visit (INDEPENDENT_AMBULATORY_CARE_PROVIDER_SITE_OTHER): Payer: Medicare Other | Admitting: Student in an Organized Health Care Education/Training Program

## 2018-05-23 ENCOUNTER — Ambulatory Visit (INDEPENDENT_AMBULATORY_CARE_PROVIDER_SITE_OTHER): Payer: Self-pay

## 2018-05-23 ENCOUNTER — Encounter (INDEPENDENT_AMBULATORY_CARE_PROVIDER_SITE_OTHER): Payer: Self-pay | Admitting: Student in an Organized Health Care Education/Training Program

## 2018-05-23 DIAGNOSIS — I252 Old myocardial infarction: Secondary | ICD-10-CM | POA: Diagnosis not present

## 2018-05-23 DIAGNOSIS — I2510 Atherosclerotic heart disease of native coronary artery without angina pectoris: Secondary | ICD-10-CM | POA: Diagnosis not present

## 2018-05-23 DIAGNOSIS — R413 Other amnesia: Secondary | ICD-10-CM | POA: Diagnosis not present

## 2018-05-23 HISTORY — DX: Old myocardial infarction: I25.2

## 2018-05-23 NOTE — Patient Instructions (Signed)
Get blood pressure monitor: Omron model 5, 7, or 10  (upper arm cuff)

## 2018-05-23 NOTE — Telephone Encounter (Signed)
Pt Rick [name] called [facility]) for advice related to their current health.     Pt last evaluated by our office on: New to the office    Covid-19 Screening:    COVID-19 Questionnaire:  Last updated: May 09 2018    Fever: no       Temp:             Cough: dry in al nothing out of the normal  Dyspnea: yes with exertion  International travel to countries that have a Level 3 Health Notice (GuestResidence.com.cy) in the past 14 days:  no  Travel history inside the continental [place] within past 14 days:  yes, no  Close contact with a laboratory confirmed COVID-19 person: no  Therapist, occupational:  no  Immunocompromised:  heart disease and blockage, short term memory loss  Long term facility resident: No  Sole provider for dependent at high risk:  no  Pregnant 3rd trimester or HRP: N/A         Pt has been sick for 4 month    Pt complaints in addition to those documented above:    Pt has been having fatigue and DOE, he had an EKG in mid December in [place] but the office lost the EKG, pt was told that he had blockage and needed to see a cardiologist. He has not fu with cardiology.     Pt currently taking these medications:     miltivitamin calcium b 12, vitamin E, asa 81 mg, donepazil, super beta plus    Assessment/ Plan:    Pt is at high risk for COVID 19 due to age and co-morbidities. Son agreeable to video visit with Dr. Arlester Marker. Will have front desk send link for zoom.

## 2018-05-23 NOTE — Progress Notes (Signed)
Patient ID: Rick [name]  is a 85 y.o.  male.     New patient here to establish care.  Patient son present for history.    Verbal consent has been obtained from the patient to conduct a video visit encounter to minimize exposure to COVID-19:  Yes       Chief Complaint   Patient presents with    Establish Care      HPI     Problem   Memory Deficits    Stable on donepezil 20 mg daily     Coronary Artery Disease With History of Myocardial Infarction Without History of Cabg    Reports history of CABG in 2003, unclear how many vessels, stable on ASA, not on statin/BB, denies chest pain/dyspnea          The following portions of the patient's history were reviewed and updated as appropriate: current medications, allergies, past family history, past medical history, past social history, past surgical history and problem list.     PAST MEDICAL HISTORY:  History reviewed. No pertinent past medical history.    PAST SURGICAL HISTORY:  Past Surgical History:   Procedure Laterality Date    JOINT REPLACEMENT Left 2017    Left knee    JOINT REPLACEMENT Right 2005    Right knee       FAMILY HISTORY:  History reviewed. No pertinent family history.    SOCIAL HISTORY:  Social History     Socioeconomic History    Marital status: Unknown     Spouse name: None    Number of children: None    Years of education: None    Highest education level: None   Occupational History    None   Social Engineer, site strain: None    Food insecurity:     Worry: None     Inability: None    Transportation needs:     Medical: None     Non-medical: None   Tobacco Use    Smoking status: None   Substance and Sexual Activity    Alcohol use: None    Drug use: None    Sexual activity: None   Lifestyle    Physical activity:     Days per week: None     Minutes per session: None    Stress: None   Relationships    Social connections:     Talks on phone: None     Gets together: None     Attends religious service: None     Active  member of club or organization: None     Attends meetings of clubs or organizations: None     Relationship status: None    Intimate partner violence:     Fear of current or ex partner: None     Emotionally abused: None     Physically abused: None     Forced sexual activity: None   Other Topics Concern    None   Social History Narrative    None       ALLERGIES:  No Known Allergies    Review of Systems   Constitutional: Negative for chills, fever, malaise/fatigue and weight loss.   Eyes: Negative for blurred vision.   Respiratory: Negative for cough and shortness of breath.    Cardiovascular: Negative for chest pain, palpitations and orthopnea.   Gastrointestinal: Negative for abdominal pain and blood in stool.   Musculoskeletal: Negative for joint pain and myalgias.  Skin: Negative for rash.   Neurological: Negative for dizziness, tingling, sensory change, focal weakness and headaches.   Psychiatric/Behavioral: Negative for depression. The patient is not nervous/anxious.    All other systems reviewed and are negative.        OBJECTIVE       Constitutional:  Well appearing male, in no distress  Head: NCAT   Psychological:  Mood and affect are normal, appropriate in interactions.      ASSESSMENT      Rick [name] is a 85 y.o. male with   1. Coronary artery disease with history of myocardial infarction without history of CABG  Cardiology Referral: Leamon Arnt, MD [facility])   2. Memory deficits            PLAN         1. Coronary artery disease with history of myocardial infarction without history of CABG  - Stable, asymptomatic, continue ASA, consider statin, establish care with cardiology  - Cardiology Referral: Leamon Arnt, MD [facility])    2. Memory deficits  -Stable, continue donepezil 20 mg nightly    Follow-up 3 months    Medication list reviewed with patient and updated as indicated.  Risk & Benefits of any new medication(s) were explained to the patient who verbalized understanding & agreed to the  treatment plan.  Patient given a printed copy of AVS. Patient verbalized understanding of instructions given.

## 2018-06-10 ENCOUNTER — Encounter (INDEPENDENT_AMBULATORY_CARE_PROVIDER_SITE_OTHER): Payer: Self-pay | Admitting: Cardiovascular Disease

## 2018-06-10 ENCOUNTER — Ambulatory Visit (INDEPENDENT_AMBULATORY_CARE_PROVIDER_SITE_OTHER): Payer: Medicare Other | Admitting: Cardiovascular Disease

## 2018-06-10 VITALS — BP 105/51 | HR 59 | Temp 99.5°F

## 2018-06-10 DIAGNOSIS — I2510 Atherosclerotic heart disease of native coronary artery without angina pectoris: Secondary | ICD-10-CM | POA: Diagnosis not present

## 2018-06-10 DIAGNOSIS — I252 Old myocardial infarction: Secondary | ICD-10-CM | POA: Diagnosis not present

## 2018-06-10 DIAGNOSIS — R0602 Shortness of breath: Secondary | ICD-10-CM | POA: Diagnosis not present

## 2018-06-10 NOTE — Progress Notes (Signed)
Cardiology Consult note  Cardiology Consult    Date/Time:  06/10/2018 1:48 PM  Patient Name:  Rick [name]  DoB:  1924-04-08  MRN:  [id number]  Room#: Room/bed info not found    Chief Complaint:    Chief Complaint   Patient presents with    Establish Care       Reason For Consult:   CAD s/p CABg per Dr. Arlester Marker    HPI :   85 yo s/p CABG. No chest pain. Dyspnea with walking more than 100 feet. No SOB at rest. No ortohpnea. No leg edema in ankles or feet. SBP 105-160s at home. HR 50s-70s. No longer on statin. Just moved form [place]. No h/o HTN or DM    PMHx:   History reviewed. No pertinent past medical history.    Allergies:   No Known Allergies    Home Medications:     Current Outpatient Medications on File Prior to Visit   Medication Sig Dispense Refill    aspirin EC 81 MG EC tablet Take 81 mg by mouth daily      donepezil (ARICEPT) 10 MG tablet Take 20 mg by mouth nightly       No current facility-administered medications on file prior to visit.          SoHx:     Social History     Tobacco Use    Smoking status: Never Smoker    Smokeless tobacco: Never Used   Substance Use Topics    Alcohol use: Never     Frequency: Never    Drug use: Never       Surgical Hx:     Past Surgical History:   Procedure Laterality Date    JOINT REPLACEMENT Left 2017    Left knee    JOINT REPLACEMENT Right 2005    Right knee       Family Hx:     Family History   Problem Relation Age of Onset    No known problems Mother     No known problems Father     No known problems Sister     No known problems Brother        Review of Systems:   Constitutional: Negative for fevers and chills  Skin: No rash or lesions  Respiratory: Negative for hemoptysis  Cardiovascular: as per HPI  Gastrointestinal: Negative for hematochezia  Musculoskeletal:  R leg pain, back pain  Genitourinary: Negative for hematuria  Neurologic: No headaches  Otherwise 10 point review of systems is negative.      Physical Exam:   BP 105/51 (BP Site: Left arm, Patient  Position: Sitting, Cuff Size: Medium)    Pulse (!) 59    Temp 99.5 F (37.5 C)     SpO2 on              Constitutional:      Alert, cooperative, well developed, well nourished.  No acute distress.   Skin:     Warm and dry to touch.  No apparent rashes or bruising.   Head/Eyes :    Normocephalic.  EOM intact.  Normal conjunctivae and lids.       Neck:  JVP to 5 cm.   Lungs:    Normal respiratory effort.    MSK:  Moves upper extremeties   Abdomen:     non-distended.   Extremities:   No edema.         Neurologic:   Alert and answers questions  Psychiatric:   Normal mood and affect.     Labs:     No visits with results within 2 Day(s) from this visit.   Latest known visit with results is:   No results found for any previous visit.       Radiology:     Not performed    Assessment/Plan:     Rick [name] is a 85 y.o. male who presents for cardiac evaluation of CAD s/p CABG in 2003. Some exertional dyspnea for the past month. Check echocardiogram for EF and filling pressures. Continue to follow BP at home, though no history of HTN. Sodium restriction reinforced.    Thank you for the consultation. Please feel free to contact me with any questions that may arise regarding this visit.

## 2018-06-13 ENCOUNTER — Telehealth: Payer: Self-pay | Admitting: Cardiology

## 2018-06-13 NOTE — Telephone Encounter (Signed)
error 

## 2018-06-23 ENCOUNTER — Encounter (INDEPENDENT_AMBULATORY_CARE_PROVIDER_SITE_OTHER): Payer: Self-pay | Admitting: Student in an Organized Health Care Education/Training Program

## 2018-07-04 ENCOUNTER — Encounter (INDEPENDENT_AMBULATORY_CARE_PROVIDER_SITE_OTHER): Payer: Self-pay | Admitting: Student in an Organized Health Care Education/Training Program

## 2018-07-11 ENCOUNTER — Other Ambulatory Visit (INDEPENDENT_AMBULATORY_CARE_PROVIDER_SITE_OTHER): Payer: Medicare Other

## 2018-07-12 ENCOUNTER — Ambulatory Visit (INDEPENDENT_AMBULATORY_CARE_PROVIDER_SITE_OTHER): Payer: Medicare Other

## 2018-07-12 DIAGNOSIS — R0602 Shortness of breath: Secondary | ICD-10-CM | POA: Diagnosis not present

## 2018-07-12 DIAGNOSIS — I252 Old myocardial infarction: Secondary | ICD-10-CM | POA: Diagnosis not present

## 2018-07-12 DIAGNOSIS — I2510 Atherosclerotic heart disease of native coronary artery without angina pectoris: Secondary | ICD-10-CM | POA: Diagnosis not present

## 2018-07-12 NOTE — Progress Notes (Signed)
Patients son came in to office stating they are moving patient out of state, and is requesting medication change per MyChart message/     If approved, please send to the following pharmacy:    [facility] - [place], [place] [address] [facility] [phone]

## 2018-07-13 ENCOUNTER — Telehealth (INDEPENDENT_AMBULATORY_CARE_PROVIDER_SITE_OTHER): Payer: Self-pay

## 2018-07-13 NOTE — Telephone Encounter (Signed)
Related message as per Dr Ezzard Standing:    Let him know he has a tight valve at top of heart- aortic valve. Can discuss details and options at visit next week  ----- Message -----    Spoke with patient brother and he confirmed meeting for 6/9 (video visit)

## 2018-07-13 NOTE — Telephone Encounter (Signed)
-----   Message from Stephens Shire, MD sent at 07/13/2018 11:43 AM EDT -----  Regarding: RE: Echo result    Let him know he has a tight valve at top of heart- aortic valve. Can discuss details and options at visit next week  ----- Message -----  From: Ginette Otto.  Sent: 07/13/2018  11:24 AM EDT  To: Stephens Shire, MD, Beverlyn Roux, RN  Subject: Echo result                                      Dr. Ezzard Standing,    Following up on this. Please provide Marden Noble with instructions regarding this.    Sincerely,  Annice Pih  ----- Message -----  From: Joellyn Haff, MD  Sent: 07/12/2018   4:42 PM EDT  To: Stephens Shire, MD, Tobie Lords Alignay    Severe AS, EF 50%. Has f/u with Dr Ezzard Standing 6/9.

## 2018-07-26 ENCOUNTER — Encounter (INDEPENDENT_AMBULATORY_CARE_PROVIDER_SITE_OTHER): Payer: Self-pay | Admitting: Cardiovascular Disease

## 2018-07-26 ENCOUNTER — Telehealth (INDEPENDENT_AMBULATORY_CARE_PROVIDER_SITE_OTHER): Payer: Medicare Other | Admitting: Cardiovascular Disease

## 2018-07-26 DIAGNOSIS — I252 Old myocardial infarction: Secondary | ICD-10-CM | POA: Diagnosis not present

## 2018-07-26 DIAGNOSIS — I350 Nonrheumatic aortic (valve) stenosis: Secondary | ICD-10-CM | POA: Diagnosis not present

## 2018-07-26 DIAGNOSIS — I2510 Atherosclerotic heart disease of native coronary artery without angina pectoris: Secondary | ICD-10-CM | POA: Diagnosis not present

## 2018-07-26 NOTE — Progress Notes (Signed)
[facility] - [facility]     Chief Complaint   Patient presents with    Coronary Artery Disease     f/up ov          HPI:     Rick [name] 85 y.o. male presents to the office for a follow up evaluation of SOB  Patient feels well. No syncope or near syncope. SOB no worse and may be better . No orthopnea. No edema. No chest pain.      Past Medical History     History reviewed. No pertinent past medical history.    Past Surgical History     Past Surgical History:   Procedure Laterality Date    JOINT REPLACEMENT Left 2017    Left knee    JOINT REPLACEMENT Right 2005    Right knee       Family History     Family History   Problem Relation Age of Onset    No known problems Mother     No known problems Father     No known problems Sister     No known problems Brother        Social History     Social History     Socioeconomic History    Marital status: Widowed     Spouse name: Not on file    Number of children: Not on file    Years of education: Not on file    Highest education level: Not on file   Occupational History    Not on file   Social Needs    Financial resource strain: Not on file    Food insecurity     Worry: Not on file     Inability: Not on file    Transportation needs     Medical: Not on file     Non-medical: Not on file   Tobacco Use    Smoking status: Never Smoker    Smokeless tobacco: Never Used   Substance and Sexual Activity    Alcohol use: Never     Frequency: Never    Drug use: Never    Sexual activity: Not on file   Lifestyle    Physical activity     Days per week: Not on file     Minutes per session: Not on file    Stress: Not on file   Relationships    Social connections     Talks on phone: Not on file     Gets together: Not on file     Attends religious service: Not on file     Active member of club or organization: Not on file     Attends meetings of clubs or organizations: Not on file     Relationship status: Not on file    Intimate partner violence     Fear of  current or ex partner: Not on file     Emotionally abused: Not on file     Physically abused: Not on file     Forced sexual activity: Not on file   Other Topics Concern    Not on file   Social History Narrative    Not on file       Allergies     No Known Allergies    Medications     Current Outpatient Medications on File Prior to Visit   Medication Sig Dispense Refill    aspirin EC 81 MG EC tablet Take 81 mg by mouth  daily      donepezil (ARICEPT) 10 MG tablet Take 20 mg by mouth nightly      memantine (NAMENDA) 10 MG tablet        No current facility-administered medications on file prior to visit.        Review of Systems     Constitutional: Negative for fevers and chills  Skin: No rash or lesions  Respiratory: Negative for hemoptysis  Cardiovascular: as per HPI  Gastrointestinal: Negative for hematochezia  Musculoskeletal:  No falls  Genitourinary: Negative for hematuria      Physical Exam     Vitals:     BP 121/66    There is no height or weight on file to calculate BMI.    General:  Patient appears their stated age, well-nourished.  Alert and in no apparent distress.  Eyes: No conjunctivitis, no purulent discharge, no lid lag  ENT:  Hearing grossly intact, Nares patent bilaterally, Lips moist, color appropriate for race.  Respiratory: Respiratory effort unlabored, chest expansion symmetric.    Neck: JVP not grossly elevated  Extremities: no peripheral edema  GI: Soft, nondistended, nontender.  No guarding or rebound.  Skin: Color appropriate for race, Skin warm, dry, and intact  Psychiatric: Good insight and judgment, oriented to person, place, and time    Labs     CBC: No results found for: WBC, RBC, HGB, HCT, MCV, MCHC, RDW, PLT    CMP: No results found for: NA, K, CL, CO2, GLU, BUN, CREATININE, PROT, BILITOT, ALKPHOS, AST, ALT, ANIONGAP    Lipid Panel No results found for: CHOL, TRIG, HDL      EKG     Not performed    Assessment and Plan     Janathan Bribiesca is a 85 y.o. male who presents for ongoing  evaluation of CAD s/p CABG in 2003. Some exertional dyspnea for the past month, though now patient believes symptoms have improved. Echocardiogram shows with EF 50% and severe aortic stenosis. Patient would qualify for TAVR if amenable. He will monitor symptoms and consider procedure. Will rediscuss in 3 weeks and refer to structural heart thereafter. BP well controlled today and continue to follow at home. Sodium restriction reinforced.    Please feel free to contact me should any questions arise regarding this visit.    This was a video televisit lasting 18 min

## 2018-09-21 ENCOUNTER — Telehealth (INDEPENDENT_AMBULATORY_CARE_PROVIDER_SITE_OTHER): Payer: Medicare Other | Admitting: Student in an Organized Health Care Education/Training Program

## 2018-09-21 ENCOUNTER — Encounter (INDEPENDENT_AMBULATORY_CARE_PROVIDER_SITE_OTHER): Payer: Self-pay | Admitting: Student in an Organized Health Care Education/Training Program

## 2018-09-21 DIAGNOSIS — R5381 Other malaise: Secondary | ICD-10-CM | POA: Diagnosis not present

## 2018-09-21 DIAGNOSIS — M545 Low back pain, unspecified: Secondary | ICD-10-CM

## 2018-09-21 NOTE — Progress Notes (Signed)
Patient ID: Kier Smead  is a 85 y.o.  male.     Verbal consent has been obtained from the patient to conduct a video visit encounter to minimize exposure to COVID-19:  Yes     Chief Complaint   Patient presents with    Back Pain      Back Pain   This is a new problem. The current episode started 1 to 4 weeks ago. The problem occurs daily. The problem is unchanged. The pain is present in the lumbar spine. The quality of the pain is described as aching. The pain does not radiate. The pain is moderate. The symptoms are aggravated by standing (Walking). Associated symptoms include weakness. Pertinent negatives include no abdominal pain, chest pain, fever, headaches or numbness.   H/o left knee replacement in 2017, reports left leg weakness affecting gait, uses cane    The following portions of the patient's history were reviewed and updated as appropriate: current medications, allergies, past family history, past medical history, past social history, past surgical history and problem list.     Review of Systems   Constitutional: Negative for appetite change, chills, fatigue, fever and unexpected weight change.   HENT: Negative for congestion, rhinorrhea and sore throat.    Eyes: Negative for visual disturbance.   Respiratory: Negative for cough and shortness of breath.    Cardiovascular: Negative for chest pain and palpitations.   Gastrointestinal: Negative for abdominal pain and blood in stool.   Endocrine: Negative for cold intolerance and heat intolerance.   Genitourinary: Negative for difficulty urinating.   Musculoskeletal: Positive for back pain. Negative for arthralgias.        Unsteady gait   Neurological: Positive for weakness. Negative for dizziness, numbness and headaches.   Hematological: Negative for adenopathy.   Psychiatric/Behavioral: Negative for dysphoric mood. The patient is not nervous/anxious.    All other systems reviewed and are negative.         OBJECTIVE     Physical exam limited due to  pandemic    There were no vitals taken for this visit.    Physical Exam    GENERAL : alert and in no distress   Head: ncat  LUNGS: unlabored breathing  MSK: tenderness and tightness of lumbar paraspinal, reduced ROM, unsteady gait with cane   PSYCH: Normal mood and affect, linear thought process, appropriate interactions, no SI/hallucinations      ASSESSMENT      Timmy Butrick is a 85 y.o. male with   1. Acute bilateral low back pain without sciatica  Ambulatory referral to Physical Therapy   2. Debility  Ambulatory referral to Physical Therapy          PLAN     I spent 25 mins during this encounter discussing/counseling/coordinating care for the following medical problem(s)     1. Acute bilateral low back pain without sciatica  - Encourage home stretching exercise, avoid mod-heavy lifting, prn Tylenol/NSAIDs  - Ambulatory referral to Physical Therapy    2. Debility  - Ambulatory referral to Physical Therapy     Call if symptoms persist, worsen, or change.  Call with updates/questions/concerns.    F/u 3 mos PRN    Medication list reviewed with patient and updated as indicated.  Risk & Benefits of any new medication(s) were explained to the patient who verbalized understanding & agreed to the treatment plan.  Patient given a printed copy of AVS. Patient verbalized understanding of instructions given.

## 2018-09-28 ENCOUNTER — Inpatient Hospital Stay: Payer: Medicare Other | Attending: Student in an Organized Health Care Education/Training Program

## 2018-09-28 VITALS — BP 107/58

## 2018-09-28 DIAGNOSIS — M545 Low back pain, unspecified: Secondary | ICD-10-CM

## 2018-09-28 DIAGNOSIS — R262 Difficulty in walking, not elsewhere classified: Secondary | ICD-10-CM | POA: Diagnosis not present

## 2018-09-28 DIAGNOSIS — M6281 Muscle weakness (generalized): Secondary | ICD-10-CM | POA: Diagnosis not present

## 2018-09-28 DIAGNOSIS — M5451 Vertebrogenic low back pain: Secondary | ICD-10-CM | POA: Insufficient documentation

## 2018-09-28 NOTE — Progress Notes (Addendum)
Name:Rick [name] Age: 85 y.o.   Date of Service: 09/28/2018  Referring Physician: Erasmo Score, MD   Date of Injury: 09/21/2018  Date Care Plan Established/Reviewed: 09/28/2018  Date Treatment Started: 09/28/2018  End of Certification Date: 12/26/2018  Sessions in Plan of Care: 16  Surgery Date: No data was found      Visit Count: 1   Diagnosis:   1. Acute midline low back pain without sciatica    2. Muscle weakness    3. Difficulty walking        Subjective     History of Present Illness   History of Present Illness: Pt reports low back pain for approx 1 year. Pain intensified within last 3 weeks prompting him to seek MD care. No recent imaging was done. He reports no recent falls although patient's son reports that patient is unsteady on his feet walking in the home and to the mailbox.     Patient has been walking with walking stick for greater than 1 year.He is unstable walking with and without stick. Challenging navigating stairs with step to pattern. Back is painful upon standing from seated.     2017 he had L knee replacement and fell fracturing R femur requiring surgery. Pt's son reports that patient's balance has been impaired since.     Functional Limitations (PLOF): Pain standing from seated for any amount of time (no pain)  Walking with walking stick (greater than 1 year)  Unsteady stepping up curb (greater than 1 year)  Unsteady navigating stairs step to pattern (greater than 1 year)     Outcome Measure   Tool Used/Details: FOTO   [name]: 52  Predicted Functional Outcome: 70    Pain   Current pain rating: 3  At best pain rating: 6  At worst pain rating: 10  Location: low back     Social Support/Occupation  Lives in: multiple level home  Lives with: adult children  Occupation: retired       Precautions: hypertension  Allergies: Patient has no known allergies.    No past medical history on file.    Objective     Posture     Shoulders  Rounded.    Thoracic Spine  Hyperkyphosis.    Lumbar Spine   Decreased  lordosis.     Knee   Genu varus.     Gait   Walking with walking stick, fwd trunk lean, decr hip ext BL, BL shortened stride length, wide BOS      Integumentary   no wound, lesion or rash noted    Neurological Testing     Sensation     Lumbar   Left   Intact: light touch    Right   Intact: light touch       Initial Evaluation Reference and/or Current Measurements(as dated):     Rise from table with UE support    IE TUG with AD SBA for safety 33 seconds     IE weight shift L/R with HHA     IE multiple attempts when shifting L/R in bed     Initial R   R LE Strength  MMT /5 Initial  L   L   4  Hip Flexion(L1/2) 4    4-  Hip Extension 4-    4-  Hip Abduction 4-    4  Knee Flexion (S1) 4    4  Knee Extension (L3) 4    3+  Ankle DF (L4) 3+  Toe Ext (L5)     (blank fields were intentionally left blank)      BP: 107/58      Treatment     Therapeutic Exercises   Justification: To improve endurance, strength, tissue extensibility, and joint mobility   -pt edu on dx, poc, and hep  -seated pelvic tilt 10x  -Supine pelvic tilt 10x VC to limit range to comfort     Neuromuscular Re-Education   Justification: To improve neural recruitment, balance, and proprioception   -supine SLR 5x ea VC to slow motion   -seated hip ABD with Rtb resistance 10x BL  -seated marching RTB resistance 10x   -seated LAQ 10x     HEP   Access Code: [email or web address]   URL: [email or web address]   Date: 09/28/2018   Prepared by: Rick [name]      Exercises Seated Pelvic Tilt - 10 reps - 3 sets - 2x daily - 7x weekly   Supine Posterior Pelvic Tilt - 10 reps - 3 sets - 2x daily - 7x weekly   Seated March with Resistance - 10 reps - 3 sets - 2x daily - 7x weekly   Seated Isometric Hip Abduction with Resistance - 10 reps - 3 sets - 2x daily - 7x weekly   Seated Long Arc Quad - 10 reps - 3 sets - 2x daily - 7x weekly          ---      ---   Total Time   Timed Minutes  25 minutes   Untimed Minutes  18 minutes   Total Time  43  minutes        Assessment   Rick [name] is a 85 y.o. male presenting with low back pain who requires Physical Therapy for the following:  Impairments: impaired gait mechanics, LE weakness, poor balance     Pain located: low back     Clinical presentation: stable as sxs have not varied in recent weeks   Barriers to therapy: Pt age and memory deficits may impact progression   Functional Limitations (PLOF): Pain standing from seated for any amount of time (no pain)  Walking with walking stick (greater than 1 year)  Unsteady stepping up curb (greater than 1 year)  Unsteady navigating stairs step to pattern (greater than 1 year)   Prognosis: good  Plan   Visits per week: 2  Number of Sessions: 16  Direct One on One  29562: Therapeutic Exercise: To Develop Strength and Endurance, ROM and Flexibility  [id number]: Gait Training  13086: Neuromuscular Reeducation  97140: Manual Therapy techniques (mobilization, manipulation, manual traction) (gr 2-4 jt mobs, STM/IASTM PRN)  97530: Therapeutic Activities: Dynamic activities to improve functional performance  Dry Needling  Supervised Modalities  97010: Thermal modalities: hot/cold packs  57846: Electrical stimulation  Gait and balance training as tolerated       Goals    Goal 1:  Pt will complete TUG assessment in under 25 seconds with use of SPC, no guarding in order to improve safety navigating in home.    Sessions:  16      Goal 2:  Pt will be able to rise from chair x5 with use of UE in order to improve strength for STS transfers.    Sessions:  16      Goal 3:  Pt will demo SLS of 3 seconds BL in order to improve safety stepping up curb in community.    Sessions:  16      Goal  4:  Pt will demo gross hip strength of 4/5 in order to improve walking tolerance.    Sessions:  16          Goal 5:  Patient will demonstrate independence in prescribed HEP with proper form, sets and reps for safe discharge to an independent program.   Sessions:  16                         Rick [name], PT

## 2018-10-03 ENCOUNTER — Inpatient Hospital Stay: Payer: Medicare Other

## 2018-10-03 DIAGNOSIS — M6281 Muscle weakness (generalized): Secondary | ICD-10-CM | POA: Diagnosis not present

## 2018-10-03 DIAGNOSIS — M545 Low back pain, unspecified: Secondary | ICD-10-CM

## 2018-10-03 DIAGNOSIS — R262 Difficulty in walking, not elsewhere classified: Secondary | ICD-10-CM | POA: Diagnosis not present

## 2018-10-04 NOTE — PT/OT Therapy Note (Signed)
Name: Rick [name] Age: 85 y.o.   Date of Service: 10/03/2018  Referring Physician: Erasmo Score, MD   Date of Injury: 09/21/2018  Date Care Plan Established/Reviewed: 09/28/2018  Date Treatment Started: 09/28/2018  End of Certification Date: 12/26/2018  Sessions in Plan of Care: 16  Surgery Date: No data was found    Visit Count: 2   Diagnosis:   1. Acute midline low back pain without sciatica    2. Muscle weakness    3. Difficulty walking        Subjective     Social Support/Occupation  Lives in: multiple level home  Lives with: adult children  Occupation: retired     Patient and son report he has been performing HEP at home. Patient's son reports he particularly c/o LBP when walking back from the mailbox.       Precautions: hypertension  Allergies: Patient has no known allergies.     Initial Evaluation Reference and/or Current Measurements(as dated):    Objective     Posture     Shoulders  Rounded.    Thoracic Spine  Hyperkyphosis.    Lumbar Spine   Decreased lordosis.     Knee   Genu varus.     Gait   Walking with walking stick, fwd trunk lean, decr hip ext BL, BL shortened stride length, wide BOS      Integumentary   no wound, lesion or rash noted    Neurological Testing     Sensation     Lumbar   Left   Intact: light touch    Right   Intact: light touch       Initial Evaluation Reference and/or Current Measurements(as dated):     Rise from table with UE support    IE TUG with AD SBA for safety 33 seconds     IE weight shift L/R with HHA     IE multiple attempts when shifting L/R in bed     Initial R   R LE Strength  MMT /5 Initial  L   L   4  Hip Flexion(L1/2) 4    4-  Hip Extension 4-    4-  Hip Abduction 4-    4  Knee Flexion (S1) 4    4  Knee Extension (L3) 4    3+  Ankle DF (L4) 3+      Toe Ext (L5)     (blank fields were intentionally left blank)                    Treatment     Therapeutic Exercises   Justification: To improve AROM, muscle extensibility,  strength, and endurance to promote goal achievement.   - NuStep x6' with subjective taken to determine course of today's treatment   - Supine (B) HS stretch with strap and assistance 2x30'' each   - LTRs with OP to end range 3''x20  - Standing gastroc stretch on slant board 2x30'' with tactile cueing to improve truncal upright   - Dynamic hip flexor stretch on step x10 (B) with tactile cueing to improve heel contact with step      Neuromuscular Re-Education   Justification: To improve activation of glute max/med and improve balance/proprioception to decrease risk of falls.   - Hook-lying clamshells RTB 3'' 2x10 with tactile cueing to improve end range performance and eccentric control   - Split stance (B) wall slides 3''x10 with CGA for safety and verbal cueing to improve  weight-shifting onto heels   - Standing heel/toe raises with HHA with verbal cueing to prevent compensatory hip extension 2x10     Manual Therapy   Justification: To decrease tissue tension and pain to decrease aberrant movements.   Supine   - Contract-relax (B) HS proximal and distal with manual stretching to end range  - Contract-relax (B) hip ERs with manual stretching cross-body to end range     Modalities   Declined by patient.     HEP  Access Code: [email or web address]   URL: [email or web address]   Date: 10/04/2018   Prepared by: Laurell Roof     Exercises  Seated Pelvic Tilt - 10 reps - 3 sets - 2x daily - 7x weekly  Supine Posterior Pelvic Tilt - 10 reps - 3 sets - 2x daily - 7x weekly  Seated March with Resistance - 10 reps - 3 sets - 2x daily - 7x weekly  Seated Isometric Hip Abduction with Resistance - 10 reps - 3 sets - 2x daily - 7x weekly  Seated Long Arc Quad - 10 reps - 3 sets - 2x daily - 7x weekly  Supine Hamstring Stretch with Strap - 2 reps - 1 sets - 30 hold - 1x daily - 7x weekly  Hip Flexor Stretch on Step - 2 reps - 1 sets - 30 hold - 1x daily - 7x weekly  Supine Lower Trunk Rotation - 10 reps - 1 sets - 3 hold - 1x daily  - 7x weekly  Gastroc Stretch on Wall - 2 reps - 1 sets - 20 hold - 1x daily - 7x weekly  Heel Toe Raises with Counter Support - 10 reps - 2 sets - 3 hold - 1x daily - 7x weekly       ---      ---   Total Time   Timed Minutes  38 minutes   Total Time  38 minutes        Assessment   Patient with marked restriction of (B) HS, hip flexors, gastrocs, and hip ERs addressed via manuals and static/dyanmic mobility exercises. Patient demonstrating tendency to lean to the (L) during split stance wall slides as patient in flexed and rotated position requiring CGA for safety. PT to update HEP to include dynamic/static stretching to improve gross posturing.   Plan   Progress functional mobility/strengthening exercises.       Goals    Goal 1:  Pt will complete TUG assessment in under 25 seconds with use of SPC, no guarding in order to improve safety navigating in home.    Sessions:  16      Goal 2:  Pt will be able to rise from chair x5 with use of UE in order to improve strength for STS transfers.    Sessions:  16      Goal 3:  Pt will demo SLS of 3 seconds BL in order to improve safety stepping up curb in community.    Sessions:  16      Goal 4:  Pt will demo gross hip strength of 4/5 in order to improve walking tolerance.    Sessions:  16          Goal 5:  Patient will demonstrate independence in prescribed HEP with proper form, sets and reps for safe discharge to an independent program.   Sessions:  16  Jackelyn Knife, PT

## 2018-10-11 ENCOUNTER — Inpatient Hospital Stay: Payer: Medicare Other

## 2018-10-11 DIAGNOSIS — M6281 Muscle weakness (generalized): Secondary | ICD-10-CM | POA: Diagnosis not present

## 2018-10-11 DIAGNOSIS — R262 Difficulty in walking, not elsewhere classified: Secondary | ICD-10-CM | POA: Diagnosis not present

## 2018-10-11 DIAGNOSIS — M545 Low back pain, unspecified: Secondary | ICD-10-CM

## 2018-10-11 NOTE — PT/OT Therapy Note (Signed)
Name: Rick [name] Age: 85 y.o.   Date of Service: 10/11/2018  Referring Physician: Erasmo Score, MD   Date of Injury: 09/21/2018  Date Care Plan Established/Reviewed: 09/28/2018  Date Treatment Started: 09/28/2018  End of Certification Date: 12/26/2018  Sessions in Plan of Care: 16  Surgery Date: No data was found    Visit Count: 3   Diagnosis:   1. Acute midline low back pain without sciatica    2. Muscle weakness    3. Difficulty walking        Subjective     Social Support/Occupation  Lives in: multiple level home  Lives with: adult children  Occupation: retired     Mr. Rick [name] reports that he has been compliant with exercises at home. Back pain has not changed since IE. Pt's father reports that balance is an issue as he grabs on to furniture when walking in home.       Precautions: hypertension  Allergies: Patient has no known allergies.     Initial Evaluation Reference and/or Current Measurements(as dated):      Gait   Walking with walking stick, fwd trunk lean, decr hip ext BL, BL shortened stride length, wide BOS      Initial Evaluation Reference and/or Current Measurements(as dated):     Rise from table with UE support    IE TUG with AD SBA for safety 33 seconds     IE weight shift L/R with HHA     IE multiple attempts when shifting L/R in bed     Initial R   R LE Strength  MMT /5 Initial  L   L   4  Hip Flexion(L1/2) 4    4-  Hip Extension 4-    4-  Hip Abduction 4-    4  Knee Flexion (S1) 4    4  Knee Extension (L3) 4    3+  Ankle DF (L4) 3+      Toe Ext (L5)     (blank fields were intentionally left blank)                          Treatment     Therapeutic Exercises   Justification: To improve AROM, muscle extensibility, strength, and endurance to promote goal achievement.   - NuStep x6' while gathering subjective info to plan today's tx   -seated HS stretch 2x20" ea VC to min fwd head with trunk lean  -lumbar L stretch at table 2x30"   -STS at EOB 10x VC for glute  engagement  -progressed to STS with OH dowel flex 10x     Neuromuscular Re-Education   Justification: To improve activation of glute max/med and improve balance/proprioception to decrease risk of falls.   In // bars  -tandem stance 2x30" SBA for safety, utilizing visual cuing for foot placement   -step up 6 in step 10x SBA for safety   -hip flexor lift off 10x ea   Table  -bridge 2x10  -SL fall out RTB 15x ea   -SLR 10x ea VC to slow motion     Modalities   Declined by patient.     HEP  Access Code: [email or web address]   URL: [email or web address]   Date: 10/04/2018   Prepared by: Laurell Roof     Exercises  Seated Pelvic Tilt - 10 reps - 3 sets - 2x daily - 7x weekly  Supine Posterior Pelvic Tilt - 10 reps - 3 sets -  2x daily - 7x weekly  Seated March with Resistance - 10 reps - 3 sets - 2x daily - 7x weekly  Seated Isometric Hip Abduction with Resistance - 10 reps - 3 sets - 2x daily - 7x weekly  Seated Long Arc Quad - 10 reps - 3 sets - 2x daily - 7x weekly  Supine Hamstring Stretch with Strap - 2 reps - 1 sets - 30 hold - 1x daily - 7x weekly  Hip Flexor Stretch on Step - 2 reps - 1 sets - 30 hold - 1x daily - 7x weekly  Supine Lower Trunk Rotation - 10 reps - 1 sets - 3 hold - 1x daily - 7x weekly  Gastroc Stretch on Wall - 2 reps - 1 sets - 20 hold - 1x daily - 7x weekly  Heel Toe Raises with Counter Support - 10 reps - 2 sets - 3 hold - 1x daily - 7x weekly       ---      ---   Total Time   Timed Minutes  39 minutes   Total Time  39 minutes        Assessment   Mr. Rick [name] requires close guarding with standing balance training. He requires constant cuing to maintain proper form. HS tightness prevents knee extension with SLR. HS stretching is introduced today. He will continue to benefit from skilled PT to improve safety navigating in home.   Plan   Updated HEP, leg press       Goals    Goal 1:  Pt will complete TUG assessment in under 25 seconds with use of SPC, no guarding in order to improve safety  navigating in home.    Sessions:  16      Goal 2:  Pt will be able to rise from chair x5 with use of UE in order to improve strength for STS transfers.    Sessions:  16      Goal 3:  Pt will demo SLS of 3 seconds BL in order to improve safety stepping up curb in community.    Sessions:  16      Goal 4:  Pt will demo gross hip strength of 4/5 in order to improve walking tolerance.    Sessions:  16          Goal 5:  Patient will demonstrate independence in prescribed HEP with proper form, sets and reps for safe discharge to an independent program.   Sessions:  16                         Rick [name], PT

## 2018-10-13 ENCOUNTER — Inpatient Hospital Stay: Payer: Medicare Other | Admitting: Physical Therapist

## 2018-10-13 DIAGNOSIS — R262 Difficulty in walking, not elsewhere classified: Secondary | ICD-10-CM | POA: Diagnosis not present

## 2018-10-13 DIAGNOSIS — M545 Low back pain, unspecified: Secondary | ICD-10-CM

## 2018-10-13 DIAGNOSIS — M6281 Muscle weakness (generalized): Secondary | ICD-10-CM | POA: Diagnosis not present

## 2018-10-13 NOTE — PT/OT Therapy Note (Signed)
Name: Rick [name] Age: 85 y.o.   Date of Service: 10/13/2018  Referring Physician: Erasmo Score, MD   Date of Injury: 09/21/2018  Date Care Plan Established/Reviewed: 09/28/2018  Date Treatment Started: 09/28/2018  End of Certification Date: 12/26/2018  Sessions in Plan of Care: 16  Surgery Date: No data was found    Visit Count: 4   Diagnosis:   1. Acute midline low back pain without sciatica    2. Muscle weakness    3. Difficulty walking        Subjective     Social Support/Occupation  Lives in: multiple level home  Lives with: adult children  Occupation: retired     Mr. Phommachanh reports that he is "feeling good" today. Pt's son reports that pt walks in the home occasionally without use of cane using furniture for balance. He is not safe walking outside the home independently.       Precautions: hypertension  Allergies: Patient has no known allergies.     Initial Evaluation Reference and/or Current Measurements(as dated):      Gait   Walking with walking stick, fwd trunk lean, decr hip ext BL, BL shortened stride length, wide BOS      Initial Evaluation Reference and/or Current Measurements(as dated):     Rise from table with UE support    IE TUG with AD SBA for safety 33 seconds     IE weight shift L/R with HHA     IE multiple attempts when shifting L/R in bed     10/13/18  5xSTS with UE support 28 sec   TUG with AD SBA for safety 28 seconds     Initial R   R LE Strength  MMT /5 Initial  L   L   4  Hip Flexion(L1/2) 4    4-  Hip Extension 4-    4-  Hip Abduction 4-    4  Knee Flexion (S1) 4    4  Knee Extension (L3) 4    3+  Ankle DF (L4) 3+      Toe Ext (L5)     (blank fields were intentionally left blank)                          Treatment     Therapeutic Exercises   Justification: To improve AROM, muscle extensibility, strength, and endurance to promote goal achievement.   - NuStep 5 min while gathering subjective info to plan today's tx   -STS EOB 10x raised to minimize use  of UE support  -HR in // bars 2x10   -updated HEP   -TUG assessment  -5x STS assessment     Neuromuscular Re-Education   Justification: To improve activation of glute max/med and improve balance/proprioception to decrease risk of falls.   In // bars  -weight shifts SBA for safety VC to minimize HHA  -standing marching 10x use of UE support  -bwd walking in // bars 4x5 ft CGA for safety   -Trunk ext in 10x   Table  -bridge 2x10  -progressed to SL clams 10x added to HEP   -SLR 10x ea TC to engage quads     Therapeutic Activity   Justification: To improve independence with mobility   -bed mobility training, log roll using UE and LE counter weight to rise from SL to seated     HEP  Access Code: [email or web address]   URL: [email or web address]   Date: 10/13/2018  Prepared by: Josiah Lobo      Exercises Seated March with Resistance- 10 reps- 3 sets- 2x daily - 7x weekly   Seated Isometric Hip Abduction with Resistance- 10 reps- 3 sets- 2x daily- 7x weekly   Seated Long Arc Quad- 10 reps- 3 sets- 2x daily- 7x weekly   Supine Bridge- 10 reps- 3 sets- 1x daily- 7x weekly   Clamshell- 10 reps- 3 sets- 1x daily- 7x weekly   Supine Active Straight Leg Raise- 10 reps- 3 sets- 1x daily- 7x weekly          ---      ---   Total Time   Timed Minutes  41 minutes   Total Time  41 minutes        Assessment   Mr. Delcastillo demonstrates improvements in TUG and STS performance today. He continues to walk with fwd trunk lean and shortened stride length with use of walking stick. When walking without stick he moves with quicker pace although searches for furniture to hold for safety. He is advised to continued with use of walking stick for safety. Bed mobility is reviewed today including use of log roll to decr pressure on back with motion. He is given updated HEP today with progression of LE strengthening. He will continue to benefit from skilled PT to improve LE strength for safety navigating in home.   Plan   Balance training on airex,  glute engagement training       Goals    Goal 1:  Pt will complete TUG assessment in under 25 seconds with use of SPC, no guarding in order to improve safety navigating in home.     09/2718 28 sec   Sessions:  16      Goal 2:  Pt will be able to rise from chair x5 with use of UE in order to improve strength for STS transfers.     10/13/18 5xSTS with use of UE SBA for safety, 28 sec    Sessions:  16      Goal 3:  Pt will demo SLS of 3 seconds BL in order to improve safety stepping up curb in community.    Sessions:  16      Goal 4:  Pt will demo gross hip strength of 4/5 in order to improve walking tolerance.    Sessions:  16          Goal 5:  Patient will demonstrate independence in prescribed HEP with proper form, sets and reps for safe discharge to an independent program.   Sessions:  16                         Josiah Lobo, DPT

## 2018-10-18 ENCOUNTER — Inpatient Hospital Stay: Payer: Medicare Other | Attending: Student in an Organized Health Care Education/Training Program

## 2018-10-18 DIAGNOSIS — R262 Difficulty in walking, not elsewhere classified: Secondary | ICD-10-CM | POA: Diagnosis not present

## 2018-10-18 DIAGNOSIS — M6281 Muscle weakness (generalized): Secondary | ICD-10-CM | POA: Diagnosis not present

## 2018-10-18 DIAGNOSIS — M545 Low back pain, unspecified: Secondary | ICD-10-CM

## 2018-10-18 NOTE — PT/OT Therapy Note (Signed)
Name: Rick [name] Age: 85 y.o.   Date of Service: 10/18/2018  Referring Physician: Erasmo Score, MD   Date of Injury: 09/21/2018  Date Care Plan Established/Reviewed: 09/28/2018  Date Treatment Started: 09/28/2018  End of Certification Date: 12/26/2018  Sessions in Plan of Care: 16  Surgery Date: No data was found    Visit Count: 5   Diagnosis:   1. Acute midline low back pain without sciatica    2. Muscle weakness    3. Difficulty walking        Subjective     Outcome Measure   Tool Used/Details: FOTO next session.     Social Support/Occupation  Lives in: multiple level home  Lives with: adult children  Occupation: retired     Patient denies any c/o back pain or soreness elsewhere at this time. Patient reports overall he "feels good".       Precautions: hypertension  Allergies: Patient has no known allergies.     Initial Evaluation Reference and/or Current Measurements(as dated):      Gait   Walking with walking stick, fwd trunk lean, decr hip ext BL, BL shortened stride length, wide BOS      Initial Evaluation Reference and/or Current Measurements(as dated):     Rise from table with UE support    IE TUG with AD SBA for safety 33 seconds     IE weight shift L/R with HHA     IE multiple attempts when shifting L/R in bed     10/13/18  5xSTS with UE support 28 sec   TUG with AD SBA for safety 28 seconds     Initial R   R LE Strength  MMT /5 Initial  L   L   4  Hip Flexion(L1/2) 4    4-  Hip Extension 4-    4-  Hip Abduction 4-    4  Knee Flexion (S1) 4    4  Knee Extension (L3) 4    3+  Ankle DF (L4) 3+      Toe Ext (L5)     (blank fields were intentionally left blank)                          Treatment     Therapeutic Exercises   Justification: To improve AROM, muscle extensibility, strength, and endurance to promote goal achievement.   - NuStep x5' to improve CV/muscular endurance   - Standing heel raises with HHA with tactile/verbal cueing to improve TKE on (L) 3'' 2x10   - Manual  (B) HS stretching 3'' x20   - LTRs 3''x20 with OP to end range   - Manual (R) hip crossbody stretch 10''x10     Neuromuscular Re-Education   Justification: To improve activation of glute max/med and improve balance/proprioception to decrease risk of falls.   - Supine bridges 3'' 2x10 with verbal/tactile cueing to improve engagement on (L)   - S/Ling clamshells (B) 3'' 2x10 with tactile cueing to prevent compensatory truncal rotation   - STS transfers with tactile cueing to improve L/S extension 3'' 2x10   - Standing TKE on (L) 3'' 2x10   - Split stance wall slides with CGA for safety 3'' 2x10 with emphasis on increased hip extension   - NBOS and (B) tandem stance with CGA for safety 2x30''     Modalities   Declined by patient.     HEP  Access Code: [email or web address]   URL:  [email or web address]   Date: 10/13/2018   Prepared by: Josiah Lobo      Exercises Seated March with Resistance- 10 reps- 3 sets- 2x daily - 7x weekly   Seated Isometric Hip Abduction with Resistance- 10 reps- 3 sets- 2x daily- 7x weekly   Seated Long Arc Quad- 10 reps- 3 sets- 2x daily- 7x weekly   Supine Bridge- 10 reps- 3 sets- 1x daily- 7x weekly   Clamshell- 10 reps- 3 sets- 1x daily- 7x weekly   Supine Active Straight Leg Raise- 10 reps- 3 sets- 1x daily- 7x weekly          ---      ---   Total Time   Timed Minutes  45 minutes   Total Time  45 minutes        Assessment   Patient demonstrating marked difficulty with rolling onto (R) side due to limited hip/truncal mobility requiring min to mod A and multiple attempts. Patient with markedly restricted HS and TKE on (L) affecting overall gait mechanics. Patient demonstrating tendency to lean to (R) during added NBOS and tandem stance balance training indicating need to further address mobility, strength, and balance deficits to decrease risk of falls.   Plan   Continue to progress balance training in varying stances/positions progressing to dynamic as tolerated.       Goals    Goal 1:  Pt  will complete TUG assessment in under 25 seconds with use of SPC, no guarding in order to improve safety navigating in home.     09/2718 28 sec   Sessions:  16      Goal 2:  Pt will be able to rise from chair x5 with use of UE in order to improve strength for STS transfers.     10/13/18 5xSTS with use of UE SBA for safety, 28 sec    Sessions:  16      Goal 3:  Pt will demo SLS of 3 seconds BL in order to improve safety stepping up curb in community.    Sessions:  16      Goal 4:  Pt will demo gross hip strength of 4/5 in order to improve walking tolerance.    Sessions:  16          Goal 5:  Patient will demonstrate independence in prescribed HEP with proper form, sets and reps for safe discharge to an independent program.   Sessions:  16                         Kennith Maes, DPT

## 2018-10-20 ENCOUNTER — Inpatient Hospital Stay: Payer: Medicare Other | Admitting: Physical Therapist

## 2018-10-20 DIAGNOSIS — M545 Low back pain, unspecified: Secondary | ICD-10-CM

## 2018-10-20 DIAGNOSIS — M6281 Muscle weakness (generalized): Secondary | ICD-10-CM | POA: Diagnosis not present

## 2018-10-20 DIAGNOSIS — R262 Difficulty in walking, not elsewhere classified: Secondary | ICD-10-CM | POA: Diagnosis not present

## 2018-10-20 NOTE — PT/OT Therapy Note (Signed)
Name: Christopher Glasscock Age: 85 y.o.   Date of Service: 10/20/2018  Referring Physician: Erasmo Score, MD   Date of Injury: 09/21/2018  Date Care Plan Established/Reviewed: 09/28/2018  Date Treatment Started: 09/28/2018  End of Certification Date: 12/26/2018  Sessions in Plan of Care: 16  Surgery Date: No data was found    Visit Count: 6   Diagnosis:   1. Acute midline low back pain without sciatica    2. Muscle weakness    3. Difficulty walking        Subjective     Outcome Measure   Tool Used/Details: FOTO next session.     Social Support/Occupation  Lives in: multiple level home  Lives with: adult children  Occupation: retired     Mr. Wherley reports that knee and back are feeling better since beginning therapy.       Precautions: hypertension  Allergies: Patient has no known allergies.     Initial Evaluation Reference and/or Current Measurements(as dated):      Gait   Walking with walking stick, fwd trunk lean, decr hip ext BL, BL shortened stride length, wide BOS      Initial Evaluation Reference and/or Current Measurements(as dated):     Rise from table with UE support    IE TUG with AD SBA for safety 33 seconds     IE weight shift L/R with HHA     IE multiple attempts when shifting L/R in bed     10/13/18  5xSTS with UE support 28 sec   TUG with AD SBA for safety 28 seconds     10/20/18  SLS in // bars 2 sec R, 1 sec L CGA for safety     Initial R   R LE Strength  MMT /5 Initial  L   L   4  Hip Flexion(L1/2) 4    4-  Hip Extension 4-    4-  Hip Abduction 4-    4  Knee Flexion (S1) 4    4  Knee Extension (L3) 4    3+  Ankle DF (L4) 3+      Toe Ext (L5)     (blank fields were intentionally left blank)                          Treatment     Therapeutic Exercises   Justification: To improve AROM, muscle extensibility, strength, and endurance to promote goal achievement.   -Nu Step 7 min while gathering subjective info to plan today's tx  -leg press 70# 2x10 VC for full knee ext    -seated HS stretch 2x30" ea VC for quad set  -pt edu on bed mobility mechanics utilizing log rolling     Neuromuscular Re-Education   Justification: To improve activation of glute max/med and improve balance/proprioception to decrease risk of falls.   -standing TKE RTB resistance 20x BL  -standing hip ext RTB 20x ea VC for upright trunk  -side step RTB at ankles in // bars 6x25 ft VC for upright trunk   -seated GTB rows with PT resistance 2x10x3"  -RTB shoulder ER 2x10   -SLS wt shifts in // bars CGA for safety 5x5" ea   -static marching in // bars VC for soft HHA 20x ea     Manual Therapy   Justification: To improve joint mobility and tissue extensibility   Supine  -Gr 3 PA distal femur R for incr knee ext  -  HS stretch with reciprocal inhibition PT assisted 2x30" ea     HEP  Access Code: [email or web address]   URL: [email or web address]   Date: 10/13/2018   Prepared by: Josiah Lobo      Exercises Seated March with Resistance- 10 reps- 3 sets- 2x daily - 7x weekly   Seated Isometric Hip Abduction with Resistance- 10 reps- 3 sets- 2x daily- 7x weekly   Seated Long Arc Quad- 10 reps- 3 sets- 2x daily- 7x weekly   Supine Bridge- 10 reps- 3 sets- 1x daily- 7x weekly   Clamshell- 10 reps- 3 sets- 1x daily- 7x weekly   Supine Active Straight Leg Raise- 10 reps- 3 sets- 1x daily- 7x weekly          ---      ---   Total Time   Timed Minutes  43 minutes   Total Time  43 minutes        Assessment   Mr. Stief demonstrates improved stability with strength training in standing with CGA and use of // bars for safety. He is able to stand for greater than 1 second without HHA in the // bars with CGA for safety. Quad control training and HS stretching are utilized today to improve knee extension in order to improve gait mechanics. He struggles to engage quads in supine without recruiting glutes. He will continue to benefit from skilled PT to improve posture with walking to decr fall risk.   Plan   Upright posture training        Goals    Goal 1:  Pt will complete TUG assessment in under 25 seconds with use of SPC, no guarding in order to improve safety navigating in home.     09/2718 28 sec   Sessions:  16      Goal 2:  Pt will be able to rise from chair x5 with use of UE in order to improve strength for STS transfers.     10/13/18 5xSTS with use of UE SBA for safety, 28 sec    Sessions:  16      Goal 3:  Pt will demo SLS of 3 seconds BL in order to improve safety stepping up curb in community.     10/20/18 in // bars 2 sec R, 1 sec L CGA for safety    Sessions:  16      Goal 4:  Pt will demo gross hip strength of 4/5 in order to improve walking tolerance.    Sessions:  16          Goal 5:  Patient will demonstrate independence in prescribed HEP with proper form, sets and reps for safe discharge to an independent program.   Sessions:  16                         Josiah Lobo, DPT

## 2018-10-25 ENCOUNTER — Inpatient Hospital Stay: Payer: Medicare Other | Admitting: Physical Therapist

## 2018-10-25 DIAGNOSIS — M545 Low back pain, unspecified: Secondary | ICD-10-CM

## 2018-10-25 DIAGNOSIS — R262 Difficulty in walking, not elsewhere classified: Secondary | ICD-10-CM | POA: Diagnosis not present

## 2018-10-25 DIAGNOSIS — M6281 Muscle weakness (generalized): Secondary | ICD-10-CM | POA: Diagnosis not present

## 2018-10-25 NOTE — Progress Notes (Signed)
Name:Rick [name] Age: 85 y.o.   Date of Service: 10/25/2018  Referring Physician: Erasmo Score, MD   Date of Injury: 09/21/2018  Date Care Plan Established/Reviewed: 09/28/2018  Date Treatment Started: 09/28/2018  End of Certification Date: 12/26/2018  Sessions in Plan of Care: 16  Surgery Date: No data was found      Visit Count: 7   Diagnosis:   1. Acute midline low back pain without sciatica    2. Muscle weakness    3. Difficulty walking      Subjective     Outcome Measure   Tool Used/Details: FOTO next session.     Social Support/Occupation  Lives in: multiple level home  Lives with: adult children  Occupation: retired     Rick [name] reports continued low back discomfort with prolonged walking and when getting into/out of a sedan. He is feeling a bit more stable on his feet when walking in his home. He continues to require accompaniment when walking to the mailbox with use of walking stick.          Precautions: hypertension  Allergies: Patient has no known allergies.    No past medical history on file.  Initial Evaluation Reference and/or Current Measurements(as dated):      Gait   Walking with walking stick, fwd trunk lean, decr hip ext BL, BL shortened stride length, wide BOS      Initial Evaluation Reference and/or Current Measurements(as dated):     Rise from table with UE support    IE TUG with AD SBA for safety 33 seconds     IE weight shift L/R with HHA     IE multiple attempts when shifting L/R in bed     10/13/18  5xSTS with UE support 28 sec   TUG with AD SBA for safety 28 seconds     10/25/18  TUB with AD SBA for safety 27 seconds    10/20/18  SLS in // bars 2 sec R, 1 sec L CGA for safety     Initial R 10/25/18  R LE Strength  MMT /5 Initial  L 10/25/18  L   4 4 Hip Flexion(L1/2) 4 4   4- 4- Hip Extension 4- 4-   4- 4- Hip Abduction 4- 4-   4 4 Knee Flexion (S1) 4 4   4 4  Knee Extension (L3) 4 4   3+ 4- Ankle DF (L4) 3+ 4-     Toe Ext (L5)     (blank fields were  intentionally left blank)                        ---      ---   Total Time   Timed Minutes  45 minutes   Total Time  45 minutes      Assessment   Rick [name] appears to be more stable when walking with SPC. He continues to ambulate with greater time WB on L. With use of BL ski poles he is better able to evenly WB through LE. He is making improvements in TUG assessment with use of AD. Rick [name] will will continue to benefit from skilled PT to improve posture with walking to decr fall risk.   Plan   Progress posture, LE strength, and balance training as tolerated          Goals    Goal 1:  Pt will complete TUG assessment in under 25 seconds with use of  SPC, no guarding in order to improve safety navigating in home.     09/2718 28 sec  10/25/18 27 sec with SPC, no guarding needed    Sessions:  16      Goal 2:  Pt will be able to rise from chair x5 with use of UE in order to improve strength for STS transfers.     10/13/18 5xSTS with use of UE SBA for safety, 28 sec    Sessions:  16      Goal 3:  Pt will demo SLS of 3 seconds BL in order to improve safety stepping up curb in community.     10/20/18 in // bars 2 sec R, 1 sec L CGA for safety    Sessions:  16      Goal 4:  Pt will demo gross hip strength of 4/5 in order to improve walking tolerance.     10/25/18 improve walking tolerance with SPC, no change in MMT    Sessions:  16          Goal 5:  Patient will demonstrate independence in prescribed HEP with proper form, sets and reps for safe discharge to an independent program.   Sessions:  16                         Josiah Lobo, DPT

## 2018-10-25 NOTE — PT/OT Therapy Note (Signed)
Name: Rick [name] Age: 85 y.o.   Date of Service: 10/25/2018  Referring Physician: Erasmo Score, MD   Date of Injury: 09/21/2018  Date Care Plan Established/Reviewed: 09/28/2018  Date Treatment Started: 09/28/2018  End of Certification Date: 12/26/2018  Sessions in Plan of Care: 16  Surgery Date: No data was found    Visit Count: 7   Diagnosis:   1. Acute midline low back pain without sciatica    2. Muscle weakness    3. Difficulty walking        Subjective     Outcome Measure   Tool Used/Details: FOTO next session.     Social Support/Occupation  Lives in: multiple level home  Lives with: adult children  Occupation: retired     Rick [name] reports continued low back discomfort with prolonged walking and when getting into/out of a sedan. He is feeling a bit more stable on his feet when walking in his home. He continues to require accompaniment when walking to the mailbox with use of walking stick.       Precautions: hypertension  Allergies: Patient has no known allergies.     Initial Evaluation Reference and/or Current Measurements(as dated):      Gait   Walking with walking stick, fwd trunk lean, decr hip ext BL, BL shortened stride length, wide BOS      Initial Evaluation Reference and/or Current Measurements(as dated):     Rise from table with UE support    IE TUG with AD SBA for safety 33 seconds     IE weight shift L/R with HHA     IE multiple attempts when shifting L/R in bed     10/13/18  5xSTS with UE support 28 sec   TUG with AD SBA for safety 28 seconds     10/25/18  TUB with AD SBA for safety 27 seconds    10/20/18  SLS in // bars 2 sec R, 1 sec L CGA for safety     Initial R 10/25/18  R LE Strength  MMT /5 Initial  L 10/25/18  L   4 4 Hip Flexion(L1/2) 4 4   4- 4- Hip Extension 4- 4-   4- 4- Hip Abduction 4- 4-   4 4 Knee Flexion (S1) 4 4   4 4  Knee Extension (L3) 4 4   3+ 4- Ankle DF (L4) 3+ 4-     Toe Ext (L5)     (blank fields were intentionally left blank)                           Treatment     Therapeutic Exercises   Justification: To improve AROM, muscle extensibility, strength, and endurance to promote goal achievement.   -Nu Step 6 min while gathering subjective info to plan today's tx  -leg press 75# 2x10 incr reps    -reviewed log rolling bed mobility   -supine pelvic tilt 10x TC for pelvic isolation   -TUG assessment   -strength assessment     Neuromuscular Re-Education   Justification: To improve activation of glute max/med and improve balance/proprioception to decrease risk of falls.   -standing TKE ball resistance 10x5" BL  -split stance weight shifts 10x ea in // bars   -static marching in // bars VC for soft HHA 20x ea   -tandem stance in // bars 3x10" ea  -NBOS in // bars added head turns 10x ea   -standing HR 20x  BL     Gait Training   Justification: To improve gait mechanics to improve safety with navigation   -walking high knee marching in // bars 6x5 ft  -walking with ski poles to improve stride length 2x25 ft  -walking with SPC 2x25 ft visual feedback in mirror for even time WB     HEP  Access Code: [email or web address]   URL: [email or web address]   Date: 10/25/2018   Prepared by: Josiah Lobo      Exercises Sitting to Supine Roll- 10 reps- 3 sets- 1x daily- 7x weekly   Supine Posterior Pelvic Tilt- 10 reps- 3 sets- 1x daily- 7x weekly   Supine Lower Trunk Rotation- 10 reps- 3 sets- 1x daily- 7x weekly   Supine Bridge- 10 reps- 3 sets- 1x daily- 7x weekly   Clamshell- 10 reps- 3 sets- 1x daily- 7x weekly   Supine Active Straight Leg Raise- 10 reps- 3 sets- 1x daily- 7x weekly   Mini Squat with Chair- 10 reps- 3 sets- 1x daily- 7x weekly            Assessment   Rick [name] appears to be more stable when walking with SPC. He continues to ambulate with greater time WB on L. With use of BL ski poles he is better able to evenly WB through LE. He is making improvements in TUG assessment with use of AD. Rick [name] will will continue to benefit from skilled PT to improve  posture with walking to decr fall risk.   Plan   Upright posture training       Goals    Goal 1:  Pt will complete TUG assessment in under 25 seconds with use of SPC, no guarding in order to improve safety navigating in home.     09/2718 28 sec   Sessions:  16      Goal 2:  Pt will be able to rise from chair x5 with use of UE in order to improve strength for STS transfers.     10/13/18 5xSTS with use of UE SBA for safety, 28 sec    Sessions:  16      Goal 3:  Pt will demo SLS of 3 seconds BL in order to improve safety stepping up curb in community.     10/20/18 in // bars 2 sec R, 1 sec L CGA for safety    Sessions:  16      Goal 4:  Pt will demo gross hip strength of 4/5 in order to improve walking tolerance.    Sessions:  16          Goal 5:  Patient will demonstrate independence in prescribed HEP with proper form, sets and reps for safe discharge to an independent program.   Sessions:  16                         Josiah Lobo, DPT

## 2018-10-27 ENCOUNTER — Inpatient Hospital Stay: Payer: Medicare Other | Admitting: Physical Therapist

## 2018-10-27 DIAGNOSIS — R262 Difficulty in walking, not elsewhere classified: Secondary | ICD-10-CM | POA: Diagnosis not present

## 2018-10-27 DIAGNOSIS — M6281 Muscle weakness (generalized): Secondary | ICD-10-CM | POA: Diagnosis not present

## 2018-10-27 DIAGNOSIS — M545 Low back pain, unspecified: Secondary | ICD-10-CM

## 2018-10-27 NOTE — PT/OT Therapy Note (Signed)
Name: Smiley Birr Age: 85 y.o.   Date of Service: 10/27/2018  Referring Physician: Erasmo Score, MD   Date of Injury: 09/21/2018  Date Care Plan Established/Reviewed: 09/28/2018  Date Treatment Started: 09/28/2018  End of Certification Date: 12/26/2018  Sessions in Plan of Care: 16  Surgery Date: No data was found    Visit Count: 8   Diagnosis:   1. Acute midline low back pain without sciatica    2. Muscle weakness    3. Difficulty walking        Subjective     Outcome Measure   Tool Used/Details: FOTO next session.     Social Support/Occupation  Lives in: multiple level home  Lives with: adult children  Occupation: retired     Mr. Ogawa son reports that Mr. Ruppert is having an easier time getting into/out of the car. He continues to be unsteady when walking to mailbox with [facility] and supervision. Mr. Helzer son reports that pt is not safe to walk outside without accompaniment.       Precautions: hypertension  Allergies: Patient has no known allergies.     Initial Evaluation Reference and/or Current Measurements(as dated):      Gait   Walking with walking stick, fwd trunk lean, decr hip ext BL, BL shortened stride length, wide BOS      Initial Evaluation Reference and/or Current Measurements(as dated):     Rise from table with UE support    IE TUG with AD SBA for safety 33 seconds     IE weight shift L/R with HHA     IE multiple attempts when shifting L/R in bed     10/13/18  5xSTS with UE support 28 sec   TUG with AD SBA for safety 28 seconds     10/25/18  TUB with AD SBA for safety 27 seconds    10/20/18  SLS in // bars 2 sec R, 1 sec L CGA for safety     Initial R 10/25/18  R LE Strength  MMT /5 Initial  L 10/25/18  L   4 4 Hip Flexion(L1/2) 4 4   4- 4- Hip Extension 4- 4-   4- 4- Hip Abduction 4- 4-   4 4 Knee Flexion (S1) 4 4   4 4  Knee Extension (L3) 4 4   3+ 4- Ankle DF (L4) 3+ 4-     Toe Ext (L5)     (blank fields were intentionally left blank)                           Treatment     Therapeutic Exercises   Justification: To improve AROM, muscle extensibility, strength, and endurance to promote goal achievement.   -Nu Step 6 min while gathering subjective info to plan today's tx, incr resistance to 4.0   -leg press 75# 2x10 incr reps    -PB roll out 10x  -counter L stretch 2x20" added to HEP  -supine LTR 10x ea VC to limit range to tol  -pelvic tilt 10x in seated VC to limit range to comfort     Neuromuscular Re-Education   Justification: To improve activation of glute max/med and improve balance/proprioception to decrease risk of falls.   -reaching outside BOS for weighted ball 2 min   -NBOS resisting pushing from all directions in // bars 2 min  -stepping over small hurdles 8x5 ft  -side stepping over small hurdles 8x5 ft  HEP  Access Code: [email or web address]   URL: [email or web address]   Date: 10/25/2018   Prepared by: Josiah Lobo      Exercises Sitting to Supine Roll- 10 reps- 3 sets- 1x daily- 7x weekly   Supine Posterior Pelvic Tilt- 10 reps- 3 sets- 1x daily- 7x weekly   Supine Lower Trunk Rotation- 10 reps- 3 sets- 1x daily- 7x weekly   Supine Bridge- 10 reps- 3 sets- 1x daily- 7x weekly   Clamshell- 10 reps- 3 sets- 1x daily- 7x weekly   Supine Active Straight Leg Raise- 10 reps- 3 sets- 1x daily- 7x weekly   Mini Squat with Chair- 10 reps- 3 sets- 1x daily- 7x weekly          ---      ---   Total Time   Timed Minutes  41 minutes   Total Time  41 minutes        Assessment   Mr. Koska continue to ambulate with short wide steps. Hurdle training is utilized to improve gait mechanics. He requires cuing to minimize circumducting hurdles with L leg and to incr R hip flexion. He requires use of railing for safety. Mr. Seeberger continues to report low back discomfort with STS transfers. Seated pelvic tilts and L spine stretching is reviewed today. Mr. Laker will will continue to benefit from skilled PT to improve posture with walking to decr fall risk.   Plan   Squib  pull at all       Goals    Goal 1:  Pt will complete TUG assessment in under 25 seconds with use of SPC, no guarding in order to improve safety navigating in home.     09/2718 28 sec  10/25/18 27 sec with SPC, no guarding needed    Sessions:  16      Goal 2:  Pt will be able to rise from chair x5 with use of UE in order to improve strength for STS transfers.     10/13/18 5xSTS with use of UE SBA for safety, 28 sec    Sessions:  16      Goal 3:  Pt will demo SLS of 3 seconds BL in order to improve safety stepping up curb in community.     10/20/18 in // bars 2 sec R, 1 sec L CGA for safety    Sessions:  16      Goal 4:  Pt will demo gross hip strength of 4/5 in order to improve walking tolerance.     10/25/18 improve walking tolerance with SPC, no change in MMT    Sessions:  16          Goal 5:  Patient will demonstrate independence in prescribed HEP with proper form, sets and reps for safe discharge to an independent program.   Sessions:  16                         Josiah Lobo, DPT

## 2018-11-08 ENCOUNTER — Inpatient Hospital Stay: Payer: Medicare Other | Admitting: Physical Therapist

## 2018-11-08 DIAGNOSIS — R262 Difficulty in walking, not elsewhere classified: Secondary | ICD-10-CM | POA: Diagnosis not present

## 2018-11-08 DIAGNOSIS — M6281 Muscle weakness (generalized): Secondary | ICD-10-CM | POA: Diagnosis not present

## 2018-11-08 DIAGNOSIS — M545 Low back pain, unspecified: Secondary | ICD-10-CM

## 2018-11-08 NOTE — PT/OT Therapy Note (Signed)
Name: Rick [name] Age: 85 y.o.   Date of Service: 11/08/2018  Referring Physician: Erasmo Score, MD   Date of Injury: 09/21/2018  Date Care Plan Established/Reviewed: 09/28/2018  Date Treatment Started: 09/28/2018  End of Certification Date: 12/26/2018  Sessions in Plan of Care: 16  Surgery Date: No data was found    Visit Count: 9   Diagnosis:   1. Muscle weakness    2. Acute midline low back pain without sciatica    3. Difficulty walking        Subjective     Social Support/Occupation  Lives in: multiple level home  Lives with: adult children  Occupation: retired     Patient continues to report increase c/o back pain with "crazy movement", and difficulty with getting into/out of the car.      Precautions: hypertension  Allergies: Patient has no known allergies.     Initial Evaluation Reference and/or Current Measurements(as dated):      Gait   Walking with walking stick, fwd trunk lean, decr hip ext BL, BL shortened stride length, wide BOS      Initial Evaluation Reference and/or Current Measurements(as dated):     Rise from table with UE support    IE TUG with AD SBA for safety 33 seconds     IE weight shift L/R with HHA     IE multiple attempts when shifting L/R in bed     10/13/18  5xSTS with UE support 28 sec   TUG with AD SBA for safety 28 seconds     10/25/18  TUB with AD SBA for safety 27 seconds    10/20/18  SLS in // bars 2 sec R, 1 sec L CGA for safety     Initial R 10/25/18  R LE Strength  MMT /5 Initial  L 10/25/18  L   4 4 Hip Flexion(L1/2) 4 4   4- 4- Hip Extension 4- 4-   4- 4- Hip Abduction 4- 4-   4 4 Knee Flexion (S1) 4 4   4 4  Knee Extension (L3) 4 4   3+ 4- Ankle DF (L4) 3+ 4-     Toe Ext (L5)     (blank fields were intentionally left blank)                          Treatment     Neuromuscular Re-Education   Justification: To improve activation of glute max/med and improve balance/proprioception to decrease risk of falls.   - Step-ups fwd and lateral x10 (B) with HHA  and cueing to improve extension of truncal segments and knees/quad activation   - Split stance balancing on step 6'' 3x30'' each with CGA for safety   - STS transfers with 2# therabar 2x10 with overhead flexion to improve truncal segment extension and knee extension/quadriceps activation   - Standing (L) stretch with cueing to improve T/S extension and hip extension 5''x10     Manual Therapy   Justification: To decrease tissue tension to decrease aberrant movements.   Supine with HOB elevated   - Contract-relax (B) proximal/distal HS     Modalities   Declined by patient.     HEP  Access Code: [email or web address]   URL: [email or web address]   Date: 10/25/2018   Prepared by: Josiah Lobo      Exercises Sitting to Supine Roll- 10 reps- 3 sets- 1x daily- 7x weekly   Supine Posterior Pelvic Tilt- 10 reps- 3 sets-  1x daily- 7x weekly   Supine Lower Trunk Rotation- 10 reps- 3 sets- 1x daily- 7x weekly   Supine Bridge- 10 reps- 3 sets- 1x daily- 7x weekly   Clamshell- 10 reps- 3 sets- 1x daily- 7x weekly   Supine Active Straight Leg Raise- 10 reps- 3 sets- 1x daily- 7x weekly   Mini Squat with Chair- 10 reps- 3 sets- 1x daily- 7x weekly          ---      ---   Total Time   Timed Minutes  40 minutes   Total Time  40 minutes        Assessment   Patient with marked limitations in (L) lumbar rotation, gross extension, and (B) knee extension due to both muscle extensibility and strength deficits. Patient most challenged by split stance balancing and lateral step ups due to poor glute med and gross extensor control. Patient requiring max cueing to improve posturing to prevent flexion and rotation to (R). Patient would therefore continue to benefit from further extension based mobility and neuromuscular training as well as transverse and frontal plane mobility and stabilization exercises.    Plan   Rotational mobility and balance.       Goals    Goal 1: Pt will complete TUG assessment in under 25 seconds with use of SPC, no  guarding in order to improve safety navigating in home.     09/2718 28 sec  10/25/18 27 sec with SPC, no guarding needed    Sessions: 16      Goal 2: Pt will be able to rise from chair x5 with use of UE in order to improve strength for STS transfers.     10/13/18 5xSTS with use of UE SBA for safety, 28 sec    Sessions: 16      Goal 3: Pt will demo SLS of 3 seconds BL in order to improve safety stepping up curb in community.     10/20/18 in // bars 2 sec R, 1 sec L CGA for safety    Sessions: 16      Goal 4: Pt will demo gross hip strength of 4/5 in order to improve walking tolerance.     10/25/18 improve walking tolerance with SPC, no change in MMT    Sessions: 16          Goal 5: Patient will demonstrate independence in prescribed HEP with proper form, sets and reps for safe discharge to an independent program.   Sessions: 16                         Kennith Maes, DPT

## 2018-11-09 ENCOUNTER — Encounter (INDEPENDENT_AMBULATORY_CARE_PROVIDER_SITE_OTHER): Payer: Self-pay | Admitting: Cardiovascular Disease

## 2018-11-11 ENCOUNTER — Inpatient Hospital Stay: Payer: Medicare Other | Admitting: Physical Therapist

## 2018-11-11 DIAGNOSIS — M6281 Muscle weakness (generalized): Secondary | ICD-10-CM | POA: Diagnosis not present

## 2018-11-11 DIAGNOSIS — M545 Low back pain, unspecified: Secondary | ICD-10-CM

## 2018-11-11 DIAGNOSIS — R262 Difficulty in walking, not elsewhere classified: Secondary | ICD-10-CM | POA: Diagnosis not present

## 2018-11-11 NOTE — PT/OT Therapy Note (Signed)
Name: Rick [name] Age: 85 y.o.   Date of Service: 11/11/2018  Referring Physician: Erasmo Score, MD   Date of Injury: 09/21/2018  Date Care Plan Established/Reviewed: 09/28/2018  Date Treatment Started: 09/28/2018  End of Certification Date: 12/26/2018  Sessions in Plan of Care: 16  Surgery Date: No data was found    Visit Count: 10   Diagnosis:   1. Muscle weakness    2. Acute midline low back pain without sciatica    3. Difficulty walking        Subjective     Social Support/Occupation  Lives in: multiple level home  Lives with: adult children  Occupation: retired     Patient continues to report c/o back pain with twisting movements. Patient and son given written HEP update this date.       Precautions: hypertension  Allergies: Patient has no known allergies.     Initial Evaluation Reference and/or Current Measurements(as dated):      Gait   Walking with walking stick, fwd trunk lean, decr hip ext BL, BL shortened stride length, wide BOS      Initial Evaluation Reference and/or Current Measurements(as dated):     Rise from table with UE support    IE TUG with AD SBA for safety 33 seconds     IE weight shift L/R with HHA     IE multiple attempts when shifting L/R in bed     10/13/18  5xSTS with UE support 28 sec   TUG with AD SBA for safety 28 seconds     10/25/18  TUB with AD SBA for safety 27 seconds    10/20/18  SLS in // bars 2 sec R, 1 sec L CGA for safety     Initial R 10/25/18  R LE Strength  MMT /5 Initial  L 10/25/18  L   4 4 Hip Flexion(L1/2) 4 4   4- 4- Hip Extension 4- 4-   4- 4- Hip Abduction 4- 4-   4 4 Knee Flexion (S1) 4 4   4 4  Knee Extension (L3) 4 4   3+ 4- Ankle DF (L4) 3+ 4-     Toe Ext (L5)     (blank fields were intentionally left blank)                          Treatment     Therapeutic Exercises   Justification: To improve AROM, muscle extensibility, and endurance to promote goal achievement.   - NuStep x6' with subjective taken to determine course of today's  treatment   - S/Ling open books with practitioner OP 10''x10   - LTRs with practitioner OP 10''x10     Neuromuscular Re-Education   Justification: To improve activation of glute max/med and improve balance/proprioception to decrease risk of falls.   - STS transfers with 1# therabar 2x10 with overhead flexion to improve truncal segment extension and knee extension/quadriceps activation   - Standing (B) TKEs via ball at wall with tactile/verbal cueing to prevent (R) truncal lean and improve quadriceps activation 3''x20   - Mini-squats within // bars and (R)LE on airex pad to improve weight-bearing on (L) 3'' 2x10 tactile/verbal cueing to prevent lumbar flexion    Manual Therapy   Justification: To decrease tissue tension to decrease aberrant movements.   Supine with HOB elevated   - Contract-relax (B) proximal/distal HS   - Contract-relax (B) hip abductors     Modalities  Declined by patient.     HEP  Access Code: [email or web address]   URL: [email or web address]   Date: 10/25/2018   Prepared by: Josiah Lobo      Exercises Sitting to Supine Roll- 10 reps- 3 sets- 1x daily- 7x weekly   Supine Posterior Pelvic Tilt- 10 reps- 3 sets- 1x daily- 7x weekly   Supine Lower Trunk Rotation- 10 reps- 3 sets- 1x daily- 7x weekly   Supine Bridge- 10 reps- 3 sets- 1x daily- 7x weekly   Clamshell- 10 reps- 3 sets- 1x daily- 7x weekly   Supine Active Straight Leg Raise- 10 reps- 3 sets- 1x daily- 7x weekly   Mini Squat with Chair- 10 reps- 3 sets- 1x daily- 7x weekly          ---      ---   Total Time   Timed Minutes  40 minutes   Total Time  40 minutes        Assessment   Patient with marked difficulty with rolling to (R) from supine due to limited (L) hip rotation addressed manually/via stretching this date. Patient with noticeable truncal lean to (R) during standing TKEs and STS transfers improved during mini-squats with use of airex pad to shift weight. Hyperextension of L/S decreased this date with decreased weight of therabar  and tactile/verbal cueing to relax C/S extensors and improve activation of scap retractors. Patient and son given updated HEP this date.   Plan   Continue to progress balancing/weight-shifting exercises to improve use of (L) glute med/max.       Goals    Goal 1: Pt will complete TUG assessment in under 25 seconds with use of SPC, no guarding in order to improve safety navigating in home.     09/2718 28 sec  10/25/18 27 sec with SPC, no guarding needed    Sessions: 16      Goal 2: Pt will be able to rise from chair x5 with use of UE in order to improve strength for STS transfers.     10/13/18 5xSTS with use of UE SBA for safety, 28 sec    Sessions: 16      Goal 3: Pt will demo SLS of 3 seconds BL in order to improve safety stepping up curb in community.     10/20/18 in // bars 2 sec R, 1 sec L CGA for safety    Sessions: 16      Goal 4: Pt will demo gross hip strength of 4/5 in order to improve walking tolerance.     10/25/18 improve walking tolerance with SPC, no change in MMT    Sessions: 16          Goal 5: Patient will demonstrate independence in prescribed HEP with proper form, sets and reps for safe discharge to an independent program.   Sessions: 16                         Kennith Maes, DPT

## 2018-11-15 ENCOUNTER — Inpatient Hospital Stay: Payer: Medicare Other | Admitting: Physical Therapist

## 2018-11-15 DIAGNOSIS — R262 Difficulty in walking, not elsewhere classified: Secondary | ICD-10-CM | POA: Diagnosis not present

## 2018-11-15 DIAGNOSIS — M545 Low back pain, unspecified: Secondary | ICD-10-CM

## 2018-11-15 DIAGNOSIS — M6281 Muscle weakness (generalized): Secondary | ICD-10-CM | POA: Diagnosis not present

## 2018-11-15 NOTE — PT/OT Therapy Note (Addendum)
Name: Rick [name] Age: 85 y.o.   Date of Service: 11/15/2018  Referring Physician: Erasmo Score, MD   Date of Injury: 09/21/2018  Date Care Plan Established/Reviewed: 09/28/2018  Date Treatment Started: 09/28/2018  End of Certification Date: 12/26/2018  Sessions in Plan of Care: 16  Surgery Date: No data was found    Visit Count: 11   Diagnosis:   1. Muscle weakness    2. Acute midline low back pain without sciatica    3. Difficulty walking        Subjective     Social Support/Occupation  Lives in: multiple level home  Lives with: adult children  Occupation: retired     Rick [name] reports that he continues to feel back stiffness with rotations. He reports that he is feeling slightly more stable walking although continues to use walking stick walking outside and always goes with another person present.       Precautions: hypertension  Allergies: Patient has no known allergies.     Initial Evaluation Reference and/or Current Measurements(as dated):      Gait   Walking with walking stick, fwd trunk lean, decr hip ext BL, BL shortened stride length, wide BOS      Initial Evaluation Reference and/or Current Measurements(as dated):     Rise from table with UE support    IE TUG with AD SBA for safety 33 seconds     IE weight shift L/R with HHA     IE multiple attempts when shifting L/R in bed     10/13/18  5xSTS with UE support 28 sec   TUG with AD SBA for safety 28 seconds     10/25/18  TUG with AD SBA for safety 27 seconds    10/20/18  SLS in // bars 2 sec R, 1 sec L CGA for safety   11/15/18 SLS in // bars  2 sec BL with CGA for safety     Initial R 10/25/18  R LE Strength  MMT /5 Initial  L 10/25/18  L   4 4 Hip Flexion(L1/2) 4 4   4- 4- Hip Extension 4- 4-   4- 4- Hip Abduction 4- 4-   4 4 Knee Flexion (S1) 4 4   4 4  Knee Extension (L3) 4 4   3+ 4- Ankle DF (L4) 3+ 4-     Toe Ext (L5)     (blank fields were intentionally left blank)                          Treatment     Therapeutic  Exercises   Justification: To improve AROM, muscle extensibility, and endurance to promote goal achievement.   - NuStep x6' with subjective taken to determine course of today's treatment   -seated PB roll out to incr L/S flex tol 10x   -seated pelvic tilt 20x added to HEP   -leg press 75# 2x10  -SL leg press 10x ea 20#   -lunge in // bars 10x ea VC to maintain weight btwn BL LE     Neuromuscular Re-Education   Justification: To improve balance, stability, and neural recruitment   -seated rows GTB 2x10 VC for upright posture   -high knee matching in // bars 6x5 ft   -squig pull at mirror 10x ea CGA for safety   -SLS stance in // bars CGA for safety 10x ea     Gait Training   Justification: To improve gait  mechanics and improve safety walking   -bwd walking in // bars 6x5 ft to incr stride length   -split stance wt shift 10x ea ea VC to min HHA     Modalities   Declined by patient.     HEP  Access Code: [email or web address]   URL: [email or web address]   Date: 10/25/2018   Prepared by: Josiah Lobo      Exercises Sitting to Supine Roll- 10 reps- 3 sets- 1x daily- 7x weekly   Supine Posterior Pelvic Tilt- 10 reps- 3 sets- 1x daily- 7x weekly   Supine Lower Trunk Rotation- 10 reps- 3 sets- 1x daily- 7x weekly   Supine Bridge- 10 reps- 3 sets- 1x daily- 7x weekly   Clamshell- 10 reps- 3 sets- 1x daily- 7x weekly   Supine Active Straight Leg Raise- 10 reps- 3 sets- 1x daily- 7x weekly   Mini Squat with Chair- 10 reps- 3 sets- 1x daily- 7x weekly          ---      ---   Total Time   Timed Minutes  40 minutes   Total Time  40 minutes        Assessment   Seated posture is reviewed today. Rick [name] is advised to perform seated pelvic tilts with prolonged time sitting in order to decr stiffness upon standing. He tolerates increasingly challenging strength training today including lunges and SL leg press. He is unable to stand on single leg without use of HHA. With CGA he is able to let go with UE for less than 2 seconds. He  will continue to benefit from strengthening and balance training to improve safety navigating in home.   Plan   High cone reach, airex training       Goals    Goal 1: Pt will complete TUG assessment in under 25 seconds with use of SPC, no guarding in order to improve safety navigating in home.     09/2718 28 sec  10/25/18 27 sec with SPC, no guarding needed    Sessions: 16      Goal 2: Pt will be able to rise from chair x5 with use of UE in order to improve strength for STS transfers.     10/13/18 5xSTS with use of UE SBA for safety, 28 sec    Sessions: 16      Goal 3: Pt will demo SLS of 3 seconds BL in order to improve safety stepping up curb in community.     10/20/18 in // bars 2 sec R, 1 sec L CGA for safety   11/15/18 2 sec BL with CGA for safety    Sessions: 16      Goal 4: Pt will demo gross hip strength of 4/5 in order to improve walking tolerance.     10/25/18 improve walking tolerance with SPC, no change in MMT    Sessions: 16          Goal 5: Patient will demonstrate independence in prescribed HEP with proper form, sets and reps for safe discharge to an independent program.   Sessions: 16                         Josiah Lobo, DPT

## 2018-11-18 ENCOUNTER — Inpatient Hospital Stay
Payer: Medicare Other | Attending: Student in an Organized Health Care Education/Training Program | Admitting: Physical Therapist

## 2018-11-18 DIAGNOSIS — M6281 Muscle weakness (generalized): Secondary | ICD-10-CM | POA: Diagnosis not present

## 2018-11-18 DIAGNOSIS — R262 Difficulty in walking, not elsewhere classified: Secondary | ICD-10-CM | POA: Diagnosis not present

## 2018-11-18 DIAGNOSIS — M545 Low back pain, unspecified: Secondary | ICD-10-CM

## 2018-11-18 NOTE — PT/OT Therapy Note (Signed)
Name: Rick [name] Age: 85 y.o.   Date of Service: 11/18/2018  Referring Physician: Erasmo Score, MD   Date of Injury: 09/21/2018  Date Care Plan Established/Reviewed: 09/28/2018  Date Treatment Started: 09/28/2018  End of Certification Date: 12/26/2018  Sessions in Plan of Care: 16  Surgery Date: No data was found    Visit Count: 12   Diagnosis:   1. Muscle weakness    2. Acute midline low back pain without sciatica    3. Difficulty walking        Subjective     Social Support/Occupation  Lives in: multiple level home  Lives with: adult children  Occupation: retired     Patient reports getting into/out of the car has been getting easier. Patient reports back pain is not constant, but continues to persist with certain movements. Upon speaking to patient's son, patient has been able to walk to the mailbox uphill without c/o back pain, but reports he continues to become "winded". Patient's son also reports history of (B) knee replacements affecting mobility and standing tolerance, but reports improved gross extension since beginning skilled PT services. Patient and son would like to continue and add further sessions to POC.       Precautions: hypertension  Allergies: Patient has no known allergies.     Initial Evaluation Reference and/or Current Measurements(as dated):      Gait   Walking with walking stick, fwd trunk lean, decr hip ext BL, BL shortened stride length, wide BOS      Initial Evaluation Reference and/or Current Measurements(as dated):     Functional Strength  Rise from table with UE support  IE TUG with AD SBA for safety 33 seconds   IE weight shift L/R with HHA   IE multiple attempts when shifting L/R in bed   10/13/18  5xSTS with UE support 28 sec   TUG with AD SBA for safety 28 seconds   10/25/18  TUG with AD SBA for safety 27 seconds    Balance   10/20/18  SLS in // bars: 2 sec R, 1 sec L CGA for safety   11/15/18   SLS in // bars: 2 sec BL with CGA for safety     Initial R 10/25/18  R 11/18/18  R LE  Strength  MMT /5 Initial  L 10/25/18  L 11/18/18  L   4 4 4+ Hip Flexion(L1/2) 4 4 4    4- 4- 4 Hip Extension 4- 4- 4   4- 4- 4+ Hip Abduction 4- 4- 4   4 4  4+ Knee Flexion (S1) 4 4 4   4 4  4+ Knee Extension (L3) 4 4 4    3+ 4- 4 Ankle DF (L4) 3+ 4- 4      Toe Ext (L5)      (blank fields were intentionally left blank)                          Treatment     Therapeutic Exercises   Justification: To improve AROM, muscle extensibility, and endurance to promote goal achievement.   - NuStep x6' with subjective taken to determine course of today's treatment   - Contract-relax (B) HS distal and proximal portion x3 sets each in hook-lying with HOB elevated   - Contract-relax (L) hip abductors/ER in hook-lying with HOB elevated with active performance of LTRs to end of available range x10   - Seated PB roll out fwd and (B) with  verbal/tactile cueing to improve spinal mechanics x10 each       Neuromuscular Re-Education   Justification: To improve balance, stability, and neural recruitment to decrease risk of falls and improve functional mobility.   - Marching within // bars on firm/foam surface with HHA with verbal/tactile cueing to promote true hip flexion versus truncal flexion 3'' x15 each   - Bwd stepping within // bars with HHA and verbal/tactile cueing to improve L/S extension x10 reps   - Rotational cross-body cone reach/stacking (B) on firm/foam surface within // bars and CGA for safety x6 each     Modalities   Declined by patient.     HEP  Access Code: [email or web address]   URL: [email or web address]   Date: 10/25/2018   Prepared by: Josiah Lobo      Exercises Sitting to Supine Roll- 10 reps- 3 sets- 1x daily- 7x weekly   Supine Posterior Pelvic Tilt- 10 reps- 3 sets- 1x daily- 7x weekly   Supine Lower Trunk Rotation- 10 reps- 3 sets- 1x daily- 7x weekly   Supine Bridge- 10 reps- 3 sets- 1x daily- 7x weekly   Clamshell- 10 reps- 3 sets- 1x daily- 7x weekly   Supine Active Straight Leg  Raise- 10 reps- 3 sets- 1x daily- 7x weekly   Mini Squat with Chair- 10 reps- 3 sets- 1x daily- 7x weekly          ---      ---   Total Time   Timed Minutes  46 minutes   Total Time  46 minutes        Assessment   Patient continuing to demonstrate marked tissue restrictions throughout (L) sided posterior chain musculature affecting (R) truncal rotation and fwd flexion. Patient with gross improvements in LE strength, though hip extension AROM and strength continuing to be limited (B) affecting posturing and gait mechanics. Patient markedly challenged by standing marching and rotational/cross-body reaching on airex due to limited postural stability and hip flexor/HS strength on (L).   Plan   Reassess balance and functional tests to update POC and add additional sessions.       Goals    Goal 1: Pt will complete TUG assessment in under 25 seconds with use of SPC, no guarding in order to improve safety navigating in home.     09/2718 28 sec  10/25/18 27 sec with SPC, no guarding needed    Sessions: 16      Goal 2: Pt will be able to rise from chair x5 with use of UE in order to improve strength for STS transfers.     10/13/18 5xSTS with use of UE SBA for safety, 28 sec    Sessions: 16      Goal 3: Pt will demo SLS of 3 seconds BL in order to improve safety stepping up curb in community.     10/20/18 in // bars 2 sec R, 1 sec L CGA for safety   11/15/18 2 sec BL with CGA for safety    Sessions: 16      Goal 4: Pt will demo gross hip strength of 4/5 in order to improve walking tolerance.     10/25/18 improve walking tolerance with SPC, no change in MMT   - Goal progressing 11/18/18: 4/5 throughout with limited truncal rotation and extension AROM and strength affecting gait mechanics; CV endurance also limited per son -JL   Sessions: 16          Goal 5: Patient will demonstrate independence  in prescribed HEP with proper form, sets and reps for safe discharge to an independent program.    - Goal progressing 11/17/18: Patient able to  list/demo HEP exercises -JL   Sessions: 16                         Kennith Maes, DPT

## 2018-11-21 ENCOUNTER — Inpatient Hospital Stay: Payer: Medicare Other | Admitting: Physical Therapist

## 2018-11-21 DIAGNOSIS — R262 Difficulty in walking, not elsewhere classified: Secondary | ICD-10-CM | POA: Diagnosis not present

## 2018-11-21 DIAGNOSIS — M545 Low back pain, unspecified: Secondary | ICD-10-CM

## 2018-11-21 DIAGNOSIS — M6281 Muscle weakness (generalized): Secondary | ICD-10-CM | POA: Diagnosis not present

## 2018-11-21 NOTE — PT/OT Therapy Note (Signed)
Name: Rick [name] Age: 85 y.o.   Date of Service: 11/21/2018  Referring Physician: Erasmo Score, MD   Date of Injury: 09/21/2018  Date Care Plan Established/Reviewed: 09/28/2018  Date Treatment Started: 09/28/2018  End of Certification Date: 12/26/2018  Sessions in Plan of Care: 9  Surgery Date: No data was found    Visit Count: 13   Diagnosis:   1. Muscle weakness    2. Acute midline low back pain without sciatica    3. Difficulty walking        Subjective     Social Support/Occupation  Lives in: multiple level home  Lives with: adult children  Occupation: retired     Rick [name] reports that back discomfort is decreasing. He is better able to get into/out of care without back pain. He is feeling more stable walking in home with decr reliance on furniture walking. He continues to need accompanyment when walking to mailbox with use of SPC. He reports discomfort to L knee from h/o TKA. Son reports that pt has never had full TKE since TKA on L.       Precautions: hypertension  Allergies: Patient has no known allergies.     Initial Evaluation Reference and/or Current Measurements(as dated):      Gait   Walking with walking stick, fwd trunk lean, decr hip ext BL, BL shortened stride length, wide BOS      Initial Evaluation Reference and/or Current Measurements(as dated):     Functional Strength  Rise from table with UE support  IE TUG with AD SBA for safety 33 seconds   IE weight shift L/R with HHA   IE multiple attempts when shifting L/R in bed   10/13/18  5xSTS with UE support 28 sec   TUG with AD SBA for safety 28 seconds   10/25/18  TUG with AD SBA for safety 27 seconds  11/21/18  TUG with AD, SBA for safety 26 sec     Balance   10/20/18  SLS in // bars: 2 sec R, 1 sec L CGA for safety   11/15/18   SLS in // bars: 2 sec BL with CGA for safety     Initial R 10/25/18  R 11/18/18  R LE Strength  MMT /5 Initial  L 10/25/18  L 11/18/18  L   4 4 4+ Hip Flexion(L1/2) 4 4 4    4- 4- 4 Hip Extension 4- 4- 4   4- 4- 4+ Hip  Abduction 4- 4- 4   4 4  4+ Knee Flexion (S1) 4 4 4   4 4  4+ Knee Extension (L3) 4 4 4    3+ 4- 4 Ankle DF (L4) 3+ 4- 4      Toe Ext (L5)      (blank fields were intentionally left blank)    11/21/2018 lacking 20 deg ext BL, hypomobility BL patellae                         Treatment     Therapeutic Exercises   Justification: To improve AROM, muscle extensibility, and endurance to promote goal achievement.   - NuStep x6' with subjective taken to determine course of today's treatment   -supine HS pull with PB 20x added ball btwn feet to prevent hip ER  -STS from EOB 10x VC to engage glutes   -TUG assessment     Neuromuscular Re-Education   Justification: To improve balance, stability, and neural recruitment to decrease risk  of falls and improve functional mobility.   -SAQ over roll 20x   -SLR 10x ea VC set ea reps, min PT assist   -bridges 10x   -SL clams 15x ea TC for proper form     Manual Therapy   Justification: To improve joint mobility and tissue extensibility   Supine  -gr 3 patellae mobs BL  -gr 2 AP prox tibia BL to incr ext     Therapeutic Activity   Justification: To improve comfort with bed mobility   -log rolling pt edu and practice x3 BL     Modalities   Declined by patient.     HEP  Access Code: [email or web address]   URL: [email or web address]   Date: 10/25/2018   Prepared by: Josiah Lobo      Exercises Sitting to Supine Roll- 10 reps- 3 sets- 1x daily- 7x weekly   Supine Posterior Pelvic Tilt- 10 reps- 3 sets- 1x daily- 7x weekly   Supine Lower Trunk Rotation- 10 reps- 3 sets- 1x daily- 7x weekly   Supine Bridge- 10 reps- 3 sets- 1x daily- 7x weekly   Clamshell- 10 reps- 3 sets- 1x daily- 7x weekly   Supine Active Straight Leg Raise- 10 reps- 3 sets- 1x daily- 7x weekly   Mini Squat with Chair- 10 reps- 3 sets- 1x daily- 7x weekly          ---      ---   Total Time   Timed Minutes  44 minutes   Total Time  44 minutes        Assessment   Rick [name] demonstrates improving TUG  assessment. First few steps upon standing today are antalgic due to L knee discomfort from TKA in 2017. As he walks, knee mobility through L improves although he demonstrates great extension ROM impairment. Rick [name] requires mod tactile cuing in order to achieve proper form with exercises. He is making good progress with safety navigating in home and feels more independence. He will benefit from additional appointments in order to achieve goals.   Plan   RDL       Goals    Goal 1: Pt will complete TUG assessment in under 25 seconds with use of SPC, no guarding in order to improve safety navigating in home.     09/2718 28 sec  10/25/18 27 sec with SPC, no guarding needed    Sessions: 22      Goal 2: Pt will be able to rise from chair x5 with use of UE in order to improve strength for STS transfers.     10/13/18 5xSTS with use of UE SBA for safety, 28 sec    Sessions: 16      Goal 3: Pt will demo SLS of 3 seconds BL in order to improve safety stepping up curb in community.     10/20/18 in // bars 2 sec R, 1 sec L CGA for safety   11/15/18 2 sec BL with CGA for safety    Sessions: 22      Goal 4: Pt will demo gross hip strength of 4/5 in order to improve walking tolerance.     10/25/18 improve walking tolerance with SPC, no change in MMT   - Goal progressing 11/18/18: 4/5 throughout with limited truncal rotation and extension AROM and strength affecting gait mechanics; CV endurance also limited per son -JL   Sessions: 22          Goal 5: Patient will demonstrate independence in  prescribed HEP with proper form, sets and reps for safe discharge to an independent program.    - Goal progressing 11/17/18: Patient able to list/demo HEP exercises -JL   Sessions: 22                         Josiah Lobo, DPT

## 2018-11-21 NOTE — Progress Notes (Signed)
Name:Rick [name] Age: 85 y.o.   Date of Service: 11/21/2018  Referring Physician: Erasmo Score, MD   Date of Injury: 09/21/2018  Date Care Plan Established/Reviewed: 09/28/2018  Date Treatment Started: 09/28/2018  End of Certification Date: 12/26/2018  Sessions in Plan of Care: 9  Surgery Date: No data was found      Visit Count: 13   Diagnosis:   1. Muscle weakness    2. Acute midline low back pain without sciatica    3. Difficulty walking        Subjective     History of Present Illness   Functional Limitations (PLOF): Discomfort getting into/out of care (greater than 1 year)  Furniture walk in home (greater than 1 year)  Walks with SPC in community (greater than 1 year)     Social Support/Occupation  Lives in: multiple level home  Lives with: adult children  Occupation: retired     Rick [name] reports that back discomfort is decreasing. He is better able to get into/out of care without back pain. He is feeling more stable walking in home with decr reliance on furniture walking. He continues to need accompanyment when walking to mailbox with use of SPC. He reports discomfort to BL knees from h/o TKA.       Precautions: hypertension  Allergies: Patient has no known allergies.    No past medical history on file.  Initial Evaluation Reference and/or Current Measurements(as dated):      Gait   Walking with walking stick, fwd trunk lean, decr hip ext BL, BL shortened stride length, wide BOS      Initial Evaluation Reference and/or Current Measurements(as dated):     Functional Strength  Rise from table with UE support  IE TUG with AD SBA for safety 33 seconds   IE weight shift L/R with HHA   IE multiple attempts when shifting L/R in bed   10/13/18  5xSTS with UE support 28 sec   TUG with AD SBA for safety 28 seconds   10/25/18  TUG with AD SBA for safety 27 seconds  11/21/18  TUG with AD, SBA for safety 26 sec     Balance   10/20/18  SLS in // bars: 2 sec R, 1 sec L CGA for safety   11/15/18   SLS in // bars: 2 sec BL with CGA  for safety     Initial R 10/25/18  R 11/18/18  R LE Strength  MMT /5 Initial  L 10/25/18  L 11/18/18  L   4 4 4+ Hip Flexion(L1/2) 4 4 4    4- 4- 4 Hip Extension 4- 4- 4   4- 4- 4+ Hip Abduction 4- 4- 4   4 4  4+ Knee Flexion (S1) 4 4 4   4 4  4+ Knee Extension (L3) 4 4 4    3+ 4- 4 Ankle DF (L4) 3+ 4- 4      Toe Ext (L5)      (blank fields were intentionally left blank)    11/21/2018 lacking 20 deg ext BL, hypomobility BL patellae                         ---      ---   Total Time   Timed Minutes  44 minutes   Total Time  44 minutes        Assessment   Rick [name] demonstrates improving TUG assessment. First few steps upon standing today are  antalgic due to L knee discomfort from TKA in 2017. As he walks, knee mobility through L improves although he demonstrates great extension ROM impairment. Rick [name] requires mod tactile cuing in order to achieve proper form with exercises. He is making good progress with safety navigating in home and feels more independence. He will benefit from additional appointments in order to achieve goals.   Functional Limitations (PLOF): Discomfort getting into/out of care (greater than 1 year)  Furniture walk in home (greater than 1 year)  Walks with SPC in community (greater than 1 year)   Prognosis: good  Plan   Visits per week: 2  Number of Sessions: 9  Direct One on One  60454: Therapeutic Exercise: To Develop Strength and Endurance, ROM and Flexibility  [id number]: Gait Training  09811: Neuromuscular Reeducation  97140: Manual Therapy techniques (mobilization, manipulation, manual traction) (gr 2-5 jt mobs, STM/IASTM PRN )  97530: Therapeutic Activities: Dynamic activities to improve functional performance  Supervised Modalities  97010: Thermal modalities: hot/cold packs  91478: Electrical stimulation    Additional appointments in order to progress balance, mobility, and strength training       Goals    Goal 1: Pt will complete TUG assessment in under 25 seconds  with use of SPC, no guarding in order to improve safety navigating in home.     09/2718 28 sec  10/25/18 27 sec with SPC, no guarding needed   11/21/18 27 with AD, no guarding    Sessions: 22      Goal 2: Pt will be able to rise from chair x5 with use of UE in order to improve strength for STS transfers.     10/13/18 5xSTS with use of UE SBA for safety, 28 sec    Sessions: 16      Goal 3: Pt will demo SLS of 3 seconds BL in order to improve safety stepping up curb in community.     10/20/18 in // bars 2 sec R, 1 sec L CGA for safety   11/15/18 2 sec BL with CGA for safety    Sessions: 22      Goal 4: Pt will demo gross hip strength of 4/5 in order to improve walking tolerance.     10/25/18 improve walking tolerance with SPC, no change in MMT   - Goal progressing 11/18/18: 4/5 throughout with limited truncal rotation and extension AROM and strength affecting gait mechanics; CV endurance also limited per son -JL   Sessions: 22          Goal 5: Patient will demonstrate independence in prescribed HEP with proper form, sets and reps for safe discharge to an independent program.    - Goal progressing 11/17/18: Patient able to list/demo HEP exercises -JL   Sessions: 22                         Josiah [name], DPT

## 2018-11-23 ENCOUNTER — Inpatient Hospital Stay: Payer: Medicare Other | Admitting: Physical Therapist

## 2018-11-23 DIAGNOSIS — R262 Difficulty in walking, not elsewhere classified: Secondary | ICD-10-CM | POA: Diagnosis not present

## 2018-11-23 DIAGNOSIS — M545 Low back pain, unspecified: Secondary | ICD-10-CM

## 2018-11-23 DIAGNOSIS — M6281 Muscle weakness (generalized): Secondary | ICD-10-CM | POA: Diagnosis not present

## 2018-11-23 NOTE — PT/OT Therapy Note (Signed)
Name: Rick [name] Age: 85 y.o.   Date of Service: 11/23/2018  Referring Physician: Erasmo Score, MD   Date of Injury: 09/21/2018  Date Care Plan Established/Reviewed: 11/21/2018  Date Treatment Started: 09/28/2018  End of Certification Date: 12/26/2018  Sessions in Plan of Care: 9  Surgery Date: No data was found    Visit Count: 14   Diagnosis:   1. Muscle weakness    2. Acute midline low back pain without sciatica    3. Difficulty walking        Subjective     Social Support/Occupation  Lives in: multiple level home  Lives with: adult children  Occupation: retired     Rick [name] reports that back discomfort is feeling better today. He continues to report compliance with HEP.       Precautions: hypertension  Allergies: Patient has no known allergies.     Initial Evaluation Reference and/or Current Measurements(as dated):      Gait   Walking with walking stick, fwd trunk lean, decr hip ext BL, BL shortened stride length, wide BOS      Initial Evaluation Reference and/or Current Measurements(as dated):     Functional Strength  Rise from table with UE support  IE TUG with AD SBA for safety 33 seconds   IE weight shift L/R with HHA   IE multiple attempts when shifting L/R in bed   10/13/18  5xSTS with UE support 28 sec   TUG with AD SBA for safety 28 seconds   10/25/18  TUG with AD SBA for safety 27 seconds  11/21/18  TUG with AD, SBA for safety 26 sec     Balance   10/20/18  SLS in // bars: 2 sec R, 1 sec L CGA for safety   11/15/18   SLS in // bars: 2 sec BL with CGA for safety     Initial R 10/25/18  R 11/18/18  R LE Strength  MMT /5 Initial  L 10/25/18  L 11/18/18  L   4 4 4+ Hip Flexion(L1/2) 4 4 4    4- 4- 4 Hip Extension 4- 4- 4   4- 4- 4+ Hip Abduction 4- 4- 4   4 4  4+ Knee Flexion (S1) 4 4 4   4 4  4+ Knee Extension (L3) 4 4 4    3+ 4- 4 Ankle DF (L4) 3+ 4- 4      Toe Ext (L5)      (blank fields were intentionally left blank)    11/21/2018 lacking 20 deg ext BL, hypomobility BL patellae                          Treatment     Therapeutic Exercises   Justification: To improve AROM, muscle extensibility, and endurance to promote goal achievement.   - NuStep x6' with subjective taken to determine course of today's treatment   -standing HR 15x VC to min use of HHA    -side step RTB in // bars 6x5 ft     Neuromuscular Re-Education   Justification: To improve balance, stability, and neural recruitment to decrease risk of falls and improve functional mobility.   -standing TKE ball at wall 10x3" TC to engage quads   -RDL 2x10 at EOB for safety VC to engage glutes   -tandem stance 3x15" ea VC to min HHA,  -SLS with single arm stability 4x10" ea  -standing on airex added head turns 20x ea   -  turning without use of UE assist 3x ea   -CGA for safety for all balance exercises     Modalities   Declined by patient.     HEP  Access Code: [email or web address]   URL: [email or web address]   Date: 10/25/2018   Prepared by: Josiah Lobo      Exercises Sitting to Supine Roll- 10 reps- 3 sets- 1x daily- 7x weekly   Supine Posterior Pelvic Tilt- 10 reps- 3 sets- 1x daily- 7x weekly   Supine Lower Trunk Rotation- 10 reps- 3 sets- 1x daily- 7x weekly   Supine Bridge- 10 reps- 3 sets- 1x daily- 7x weekly   Clamshell- 10 reps- 3 sets- 1x daily- 7x weekly   Supine Active Straight Leg Raise- 10 reps- 3 sets- 1x daily- 7x weekly   Mini Squat with Chair- 10 reps- 3 sets- 1x daily- 7x weekly          ---      ---   Total Time   Timed Minutes  42 minutes   Total Time  42 minutes        Assessment   Rick [name] continues to rely on UE support when walking. He is fearful turning 180 degrees without use of UE support. Close contact guarding is utilized to maintain safety with balance training today. With cuing and CGA he is able to turn 360 deg BL without use of UE via small shuffling steps. He is advised to continue using UE support at home for safety. He will benefit from balance training to decr reliance on UE support with movements and  improve safety in home.   Plan   Balance training       Goals    Goal 1: Pt will complete TUG assessment in under 25 seconds with use of SPC, no guarding in order to improve safety navigating in home.     09/2718 28 sec  10/25/18 27 sec with SPC, no guarding needed   11/21/18 27 with AD, no guarding    Sessions: 22      Goal 2: Pt will be able to rise from chair x5 with use of UE in order to improve strength for STS transfers.     10/13/18 5xSTS with use of UE SBA for safety, 28 sec    Sessions: 16      Goal 3: Pt will demo SLS of 3 seconds BL in order to improve safety stepping up curb in community.     10/20/18 in // bars 2 sec R, 1 sec L CGA for safety   11/15/18 2 sec BL with CGA for safety    Sessions: 22      Goal 4: Pt will demo gross hip strength of 4/5 in order to improve walking tolerance.     10/25/18 improve walking tolerance with SPC, no change in MMT   - Goal progressing 11/18/18: 4/5 throughout with limited truncal rotation and extension AROM and strength affecting gait mechanics; CV endurance also limited per son -JL   Sessions: 22          Goal 5: Patient will demonstrate independence in prescribed HEP with proper form, sets and reps for safe discharge to an independent program.    - Goal progressing 11/17/18: Patient able to list/demo HEP exercises -JL   Sessions: 22                         Josiah Lobo, DPT

## 2018-11-28 ENCOUNTER — Inpatient Hospital Stay: Payer: Medicare Other | Admitting: Physical Therapist

## 2018-12-01 ENCOUNTER — Inpatient Hospital Stay: Payer: Medicare Other | Admitting: Physical Therapist

## 2018-12-01 DIAGNOSIS — R262 Difficulty in walking, not elsewhere classified: Secondary | ICD-10-CM | POA: Diagnosis not present

## 2018-12-01 DIAGNOSIS — M545 Low back pain, unspecified: Secondary | ICD-10-CM

## 2018-12-01 DIAGNOSIS — M6281 Muscle weakness (generalized): Secondary | ICD-10-CM | POA: Diagnosis not present

## 2018-12-01 NOTE — PT/OT Therapy Note (Signed)
Name: Rick [name] Age: 85 y.o.   Date of Service: 12/01/2018  Referring Physician: Erasmo Score, MD   Date of Injury: 09/21/2018  Date Care Plan Established/Reviewed: 11/21/2018  Date Treatment Started: 09/28/2018  End of Certification Date: 12/26/2018  Sessions in Plan of Care: 9  Surgery Date: No data was found    Visit Count: 15   Diagnosis:   1. Muscle weakness    2. Acute midline low back pain without sciatica    3. Difficulty walking        Subjective     Social Support/Occupation  Lives in: multiple level home  Lives with: adult children  Occupation: retired     Patient reports he continues to have difficulty with curb/stair negotiation and getting into/out of the car.       Precautions: hypertension  Allergies: Patient has no known allergies.     Initial Evaluation Reference and/or Current Measurements(as dated):      Gait   Walking with walking stick, fwd trunk lean, decr hip ext BL, BL shortened stride length, wide BOS      Initial Evaluation Reference and/or Current Measurements(as dated):     Functional Strength  Rise from table with UE support  IE TUG with AD SBA for safety 33 seconds   IE weight shift L/R with HHA   IE multiple attempts when shifting L/R in bed   10/13/18  5xSTS with UE support 28 sec   TUG with AD SBA for safety 28 seconds   10/25/18  TUG with AD SBA for safety 27 seconds  11/21/18  TUG with AD, SBA for safety 26 sec     Balance   10/20/18  SLS in // bars: 2 sec R, 1 sec L CGA for safety   11/15/18   SLS in // bars: 2 sec BL with CGA for safety     Initial R 10/25/18  R 11/18/18  R LE Strength  MMT /5 Initial  L 10/25/18  L 11/18/18  L   4 4 4+ Hip Flexion(L1/2) 4 4 4    4- 4- 4 Hip Extension 4- 4- 4   4- 4- 4+ Hip Abduction 4- 4- 4   4 4  4+ Knee Flexion (S1) 4 4 4   4 4  4+ Knee Extension (L3) 4 4 4    3+ 4- 4 Ankle DF (L4) 3+ 4- 4      Toe Ext (L5)      (blank fields were intentionally left blank)    11/21/2018 lacking 20 deg ext BL, hypomobility BL patellae                          Treatment     Therapeutic Exercises   Justification: To improve AROM, muscle extensibility, and endurance to promote goal achievement.   - NuStep x6' with subjective taken to determine course of today's treatment   - (L) knee flexion with yellow PB and verbal cueing to improve TKE 3''x15   - Quad setting (L) with verbal/tactile cueing to prevent glute max/HS engagement 3''x15     Neuromuscular Re-Education   Justification: To improve balance, stability, and neural recruitment to decrease risk of falls and improve functional mobility.   - STS transfers with ball behind (L) knee to improve TKE 3'' 2x10   - Step-ups with visual cueing to improve upright and tactile cueing to improve glute max/quadriceps engagement 3''x10 each   - Unequal weight-bearing with (R) and (L)LE on  airex pad with C/S rotations and (B) truncal rotations 3''x10 each   - Unequal weight-bearing with (R) and (L)LE on airex pad with marching and tactile/verbal cueing to improve end range hip flexion/prevent truncal flexion 3''x10 each       Manual Therapy   Justification: To improve joint mobility/decrease tissue tension to decrease pain and aberrant movements.   Long-sitting   - DTM (L) quadriceps/CFM (L) quadriceps/patellar tendon   - Gd II/III (L) superior/medial patellar mobs   - (L) superior patellar MWM   - Gd III (L) anterior tibial/posterior femoral mobs in open packed position     Modalities   Declined by patient.     HEP  Access Code: [email or web address]   URL: [email or web address]   Date: 10/25/2018   Prepared by: Josiah Lobo      Exercises Sitting to Supine Roll- 10 reps- 3 sets- 1x daily- 7x weekly   Supine Posterior Pelvic Tilt- 10 reps- 3 sets- 1x daily- 7x weekly   Supine Lower Trunk Rotation- 10 reps- 3 sets- 1x daily- 7x weekly   Supine Bridge- 10 reps- 3 sets- 1x daily- 7x weekly   Clamshell- 10 reps- 3 sets- 1x daily- 7x weekly   Supine Active Straight Leg Raise- 10 reps- 3 sets- 1x daily- 7x weekly   Mini  Squat with Chair- 10 reps- 3 sets- 1x daily- 7x weekly          ---      ---   Total Time   Timed Minutes  43 minutes   Total Time  43 minutes        Assessment   Marked stiffness noted (L) TFJ and PFJ in directions needed for extension addressed via manuals this date. Isolated quad setting prior to functional engagement with performance of STS transfers and step-ups. Patient with improved ability to maintain gross extension with visual cueing and use of airex pad to improved weight-bearing onto (L)LE and promote elongation on (R).   Plan   Progress functional extension training. Work on Engineer, structural and truncal elongation with marching.       Goals    Goal 1: Pt will complete TUG assessment in under 25 seconds with use of SPC, no guarding in order to improve safety navigating in home.     09/2718 28 sec  10/25/18 27 sec with SPC, no guarding needed   11/21/18 27 with AD, no guarding    Sessions: 22      Goal 2: Pt will be able to rise from chair x5 with use of UE in order to improve strength for STS transfers.     10/13/18 5xSTS with use of UE SBA for safety, 28 sec    Sessions: 16      Goal 3: Pt will demo SLS of 3 seconds BL in order to improve safety stepping up curb in community.     10/20/18 in // bars 2 sec R, 1 sec L CGA for safety   11/15/18 2 sec BL with CGA for safety    Sessions: 22      Goal 4: Pt will demo gross hip strength of 4/5 in order to improve walking tolerance.     10/25/18 improve walking tolerance with SPC, no change in MMT   - Goal progressing 11/18/18: 4/5 throughout with limited truncal rotation and extension AROM and strength affecting gait mechanics; CV endurance also limited per son -JL   Sessions: 22          Goal 5:  Patient will demonstrate independence in prescribed HEP with proper form, sets and reps for safe discharge to an independent program.    - Goal progressing 11/17/18: Patient able to list/demo HEP exercises -JL   Sessions: 22                         Kennith Maes,  DPT

## 2018-12-01 NOTE — PT/OT Therapy Note (Deleted)
Name: Rick [name] Age: 85 y.o.   Date of Service: 12/01/2018  Referring Physician: Erasmo Score, MD   Date of Injury: 09/21/2018  Date Care Plan Established/Reviewed: 11/21/2018  Date Treatment Started: 09/28/2018  End of Certification Date: 12/26/2018  Sessions in Plan of Care: 9  Surgery Date: No data was found    Visit Count: 15   Diagnosis:   1. Muscle weakness    2. Acute midline low back pain without sciatica    3. Difficulty walking        Subjective     Social Support/Occupation  Lives in: multiple level home  Lives with: adult children  Occupation: retired     Rick [name] reports that back discomfort is feeling better today. He continues to report compliance with HEP.       Precautions: hypertension  Allergies: Patient has no known allergies.     Initial Evaluation Reference and/or Current Measurements(as dated):      Gait   Walking with walking stick, fwd trunk lean, decr hip ext BL, BL shortened stride length, wide BOS      Initial Evaluation Reference and/or Current Measurements(as dated):     Functional Strength  Rise from table with UE support  IE TUG with AD SBA for safety 33 seconds   IE weight shift L/R with HHA   IE multiple attempts when shifting L/R in bed   10/13/18  5xSTS with UE support 28 sec   TUG with AD SBA for safety 28 seconds   10/25/18  TUG with AD SBA for safety 27 seconds  11/21/18  TUG with AD, SBA for safety 26 sec     Balance   10/20/18  SLS in // bars: 2 sec R, 1 sec L CGA for safety   11/15/18   SLS in // bars: 2 sec BL with CGA for safety     Initial R 10/25/18  R 11/18/18  R LE Strength  MMT /5 Initial  L 10/25/18  L 11/18/18  L   4 4 4+ Hip Flexion(L1/2) 4 4 4    4- 4- 4 Hip Extension 4- 4- 4   4- 4- 4+ Hip Abduction 4- 4- 4   4 4  4+ Knee Flexion (S1) 4 4 4   4 4  4+ Knee Extension (L3) 4 4 4    3+ 4- 4 Ankle DF (L4) 3+ 4- 4      Toe Ext (L5)      (blank fields were intentionally left blank)    11/21/2018 lacking 20 deg ext BL, hypomobility BL patellae                          Treatment     Therapeutic Exercises   Justification: To improve AROM, muscle extensibility, and endurance to promote goal achievement.   - NuStep x6' with subjective taken to determine course of today's treatment   -standing HR 15x VC to min use of HHA    -side step RTB in // bars 6x5 ft     Neuromuscular Re-Education   Justification: To improve balance, stability, and neural recruitment to decrease risk of falls and improve functional mobility.   -standing TKE ball at wall 10x3" TC to engage quads   -RDL 2x10 at EOB for safety VC to engage glutes   -tandem stance 3x15" ea VC to min HHA,  -SLS with single arm stability 4x10" ea  -standing on airex added head turns 20x ea   -  turning without use of UE assist 3x ea   -CGA for safety for all balance exercises     Modalities   Declined by patient.     HEP  Access Code: [email or web address]   URL: [email or web address]   Date: 10/25/2018   Prepared by: Josiah Lobo      Exercises Sitting to Supine Roll- 10 reps- 3 sets- 1x daily- 7x weekly   Supine Posterior Pelvic Tilt- 10 reps- 3 sets- 1x daily- 7x weekly   Supine Lower Trunk Rotation- 10 reps- 3 sets- 1x daily- 7x weekly   Supine Bridge- 10 reps- 3 sets- 1x daily- 7x weekly   Clamshell- 10 reps- 3 sets- 1x daily- 7x weekly   Supine Active Straight Leg Raise- 10 reps- 3 sets- 1x daily- 7x weekly   Mini Squat with Chair- 10 reps- 3 sets- 1x daily- 7x weekly            Assessment   Rick [name] continues to rely on UE support when walking. He is fearful turning 180 degrees without use of UE support. Close contact guarding is utilized to maintain safety with balance training today. With cuing and CGA he is able to turn 360 deg BL without use of UE via small shuffling steps. He is advised to continue using UE support at home for safety. He will benefit from balance training to decr reliance on UE support with movements and improve safety in home.   Plan   Balance training       Goals    Goal 1: Pt will  complete TUG assessment in under 25 seconds with use of SPC, no guarding in order to improve safety navigating in home.     09/2718 28 sec  10/25/18 27 sec with SPC, no guarding needed   11/21/18 27 with AD, no guarding    Sessions: 22      Goal 2: Pt will be able to rise from chair x5 with use of UE in order to improve strength for STS transfers.     10/13/18 5xSTS with use of UE SBA for safety, 28 sec    Sessions: 16      Goal 3: Pt will demo SLS of 3 seconds BL in order to improve safety stepping up curb in community.     10/20/18 in // bars 2 sec R, 1 sec L CGA for safety   11/15/18 2 sec BL with CGA for safety    Sessions: 22      Goal 4: Pt will demo gross hip strength of 4/5 in order to improve walking tolerance.     10/25/18 improve walking tolerance with SPC, no change in MMT   - Goal progressing 11/18/18: 4/5 throughout with limited truncal rotation and extension AROM and strength affecting gait mechanics; CV endurance also limited per son -JL   Sessions: 22          Goal 5: Patient will demonstrate independence in prescribed HEP with proper form, sets and reps for safe discharge to an independent program.    - Goal progressing 11/17/18: Patient able to list/demo HEP exercises -JL   Sessions: 22                         Kennith Maes, DPT

## 2018-12-07 ENCOUNTER — Inpatient Hospital Stay: Payer: Medicare Other | Admitting: Physical Therapist

## 2018-12-07 DIAGNOSIS — M6281 Muscle weakness (generalized): Secondary | ICD-10-CM | POA: Diagnosis not present

## 2018-12-07 DIAGNOSIS — R262 Difficulty in walking, not elsewhere classified: Secondary | ICD-10-CM | POA: Diagnosis not present

## 2018-12-07 DIAGNOSIS — M545 Low back pain, unspecified: Secondary | ICD-10-CM

## 2018-12-07 NOTE — PT/OT Therapy Note (Signed)
Name: Rick [name] Age: 85 y.o.   Date of Service: 12/07/2018  Referring Physician: Erasmo Score, MD   Date of Injury: 09/21/2018  Date Care Plan Established/Reviewed: 11/21/2018  Date Treatment Started: 09/28/2018  End of Certification Date: 12/26/2018  Sessions in Plan of Care: 9  Surgery Date: No data was found    Visit Count: 16   Diagnosis:   1. Muscle weakness    2. Acute midline low back pain without sciatica    3. Difficulty walking        Subjective     Social Support/Occupation  Lives in: multiple level home  Lives with: adult children  Occupation: retired     Patient reports he feels a "bit sleepy" today. Patient reports his knees feel "so-so".       Precautions: hypertension  Allergies: Patient has no known allergies.     Initial Evaluation Reference and/or Current Measurements(as dated):      Gait   Walking with walking stick, fwd trunk lean, decr hip ext BL, BL shortened stride length, wide BOS      Initial Evaluation Reference and/or Current Measurements(as dated):     Functional Strength  Rise from table with UE support  IE TUG with AD SBA for safety 33 seconds   IE weight shift L/R with HHA   IE multiple attempts when shifting L/R in bed   10/13/18  5xSTS with UE support 28 sec   TUG with AD SBA for safety 28 seconds   10/25/18  TUG with AD SBA for safety 27 seconds  11/21/18  TUG with AD, SBA for safety 26 sec  12/07/18  TUG with AD and SBA for safety 22'' with near LOB with turning to (R)     Balance   10/20/18  SLS in // bars: 2 sec R, 1 sec L CGA for safety   11/15/18   SLS in // bars: 2 sec BL with CGA for safety   12/07/18  SLS in // bars: (R) 30'' with 2 finger assist, (L) 10'' with 2 finger assist     Initial R 10/25/18  R 11/18/18  R LE Strength  MMT /5 Initial  L 10/25/18  L 11/18/18  L   4 4 4+ Hip Flexion(L1/2) 4 4 4    4- 4- 4 Hip Extension 4- 4- 4   4- 4- 4+ Hip Abduction 4- 4- 4   4 4  4+ Knee Flexion (S1) 4 4 4   4 4  4+ Knee Extension (L3) 4 4 4    3+ 4- 4 Ankle DF (L4) 3+  4- 4      Toe Ext (L5)      (blank fields were intentionally left blank)    11/21/2018 lacking 20 deg ext BL, hypomobility BL patellae                         Treatment     Therapeutic Exercises   Justification: To improve AROM, muscle extensibility, and endurance to promote goal achievement.   - NuStep x6' with subjective taken to determine course of today's treatment   - Quad setting (B) with superior patellar mobs 3'' 2x10 with verbal cueing to decrease glute max compensations   - Seated (B) HS stretch with reach and verbal cueing to improve TKE x30'' each   - Contract-relax (B) HS distal/proximal portions x3 each     Therapeutic Activity   Justification: To improve safety with obstacle and curb negotiation.   -  Dips (B) with HHA 3'' 2x10 with verbal/tactile cueing to improve quadriceps engagement and prevent truncal rotation   - Step-ups/overs 6'' hurdles leading with (B)LEs 2x5 each with verbal cueing to maintain truncal extension with hip flexion and trail leg knee flexion   - Split stance knee flexion (B) with HHA to improve foot/knee clearance 3''x10 each   - Reassessment of SLS and TUG to determine progress towards goals     Modalities   Declined by patient.     HEP  Access Code: [email or web address]   URL: [email or web address]   Date: 10/25/2018   Prepared by: Josiah Lobo      Exercises Sitting to Supine Roll- 10 reps- 3 sets- 1x daily- 7x weekly   Supine Posterior Pelvic Tilt- 10 reps- 3 sets- 1x daily- 7x weekly   Supine Lower Trunk Rotation- 10 reps- 3 sets- 1x daily- 7x weekly   Supine Bridge- 10 reps- 3 sets- 1x daily- 7x weekly   Clamshell- 10 reps- 3 sets- 1x daily- 7x weekly   Supine Active Straight Leg Raise- 10 reps- 3 sets- 1x daily- 7x weekly   Mini Squat with Chair- 10 reps- 3 sets- 1x daily- 7x weekly          ---      ---   Total Time   Timed Minutes  45 minutes   Total Time  45 minutes        Assessment   Patient re-educated on quad setting without glute max compensations and seated  HS stretching as part of HEP to improve TKE and decrease "pulling" sensation reported on L/S with fwd flexion. Patient with difficulty with performance of dips due to limited quadriceps control (R) > (L) and with near LOB with rotation during TUG reassessment. Patient educated on safe way to negotiate curbs and steps and instructed to practice at home.   Plan   Rotational training on (L) hip. Continue to work on truncal elongation with hip/knee clearance.       Goals    Goal 1: Pt will complete TUG assessment in under 25 seconds with use of SPC, no guarding in order to improve safety navigating in home.     09/2718 28 sec  10/25/18 27 sec with SPC, no guarding needed   11/21/18 27 with AD, no guarding   - Goal progressing 12/07/18: 22'' with use of AD with near LOB with rotation to (L) -JL   Sessions: 22      Goal 2: Pt will be able to rise from chair x5 with use of UE in order to improve strength for STS transfers.     10/13/18 5xSTS with use of UE SBA for safety, 28 sec    Sessions: 16      Goal 3: Pt will demo SLS of 3 seconds BL in order to improve safety stepping up curb in community.     10/20/18 in // bars 2 sec R, 1 sec L CGA for safety   11/15/18 2 sec BL with CGA for safety   - Goal progressing 12/07/18: SLS (R) 30'' with 2 finger assist, 10'' (L) with 2 finger assist -JL   Sessions: 22      Goal 4: Pt will demo gross hip strength of 4/5 in order to improve walking tolerance.     10/25/18 improve walking tolerance with SPC, no change in MMT   - Goal progressing 11/18/18: 4/5 throughout with limited truncal rotation and extension AROM and strength affecting gait mechanics; CV endurance  also limited per son -JL   Sessions: 22          Goal 5: Patient will demonstrate independence in prescribed HEP with proper form, sets and reps for safe discharge to an independent program.    - Goal progressing 11/17/18: Patient able to list/demo HEP exercises -JL   Sessions: 22                         Kennith Maes, DPT

## 2018-12-09 ENCOUNTER — Inpatient Hospital Stay: Payer: Medicare Other | Admitting: Physical Therapist

## 2018-12-09 DIAGNOSIS — R262 Difficulty in walking, not elsewhere classified: Secondary | ICD-10-CM | POA: Diagnosis not present

## 2018-12-09 DIAGNOSIS — M545 Low back pain, unspecified: Secondary | ICD-10-CM

## 2018-12-09 DIAGNOSIS — M6281 Muscle weakness (generalized): Secondary | ICD-10-CM | POA: Diagnosis not present

## 2018-12-09 NOTE — PT/OT Therapy Note (Signed)
Name: Rick [name] Age: 85 y.o.   Date of Service: 12/09/2018  Referring Physician: Erasmo Score, MD   Date of Injury: 09/21/2018  Date Care Plan Established/Reviewed: 11/21/2018  Date Treatment Started: 09/28/2018  End of Certification Date: 12/26/2018  Sessions in Plan of Care: 9  Surgery Date: No data was found    Visit Count: 17   Diagnosis:   1. Muscle weakness    2. Acute midline low back pain without sciatica    3. Difficulty walking        Subjective     Social Support/Occupation  Lives in: multiple level home  Lives with: adult children  Occupation: retired     Patient reports he twisted his back this morning but that it did not bother him. Patient reports getting up out of chairs and getting going takes a little bit.       Precautions: hypertension  Allergies: Patient has no known allergies.     Initial Evaluation Reference and/or Current Measurements(as dated):      Gait   Walking with walking stick, fwd trunk lean, decr hip ext BL, BL shortened stride length, wide BOS      Initial Evaluation Reference and/or Current Measurements(as dated):     Functional Strength  Rise from table with UE support  IE TUG with AD SBA for safety 33 seconds   IE weight shift L/R with HHA   IE multiple attempts when shifting L/R in bed   10/13/18  5xSTS with UE support 28 sec   TUG with AD SBA for safety 28 seconds   10/25/18  TUG with AD SBA for safety 27 seconds  11/21/18  TUG with AD, SBA for safety 26 sec  12/07/18  TUG with AD and SBA for safety 22'' with near LOB with turning to (R)     Balance   10/20/18  SLS in // bars: 2 sec R, 1 sec L CGA for safety   11/15/18   SLS in // bars: 2 sec BL with CGA for safety   12/07/18  SLS in // bars: (R) 30'' with 2 finger assist, (L) 10'' with 2 finger assist     Initial R 10/25/18  R 11/18/18  R LE Strength  MMT /5 Initial  L 10/25/18  L 11/18/18  L   4 4 4+ Hip Flexion(L1/2) 4 4 4    4- 4- 4 Hip Extension 4- 4- 4   4- 4- 4+ Hip Abduction 4- 4- 4   4 4  4+ Knee Flexion (S1) 4  4 4   4 4  4+ Knee Extension (L3) 4 4 4    3+ 4- 4 Ankle DF (L4) 3+ 4- 4      Toe Ext (L5)      (blank fields were intentionally left blank)    11/21/2018 lacking 20 deg ext BL, hypomobility BL patellae                         Treatment     Therapeutic Exercises   Justification: To improve AROM, muscle extensibility, and endurance to promote goal achievement.   - NuStep x6' with subjective taken to determine course of today's treatment   - Seated pulleys to improve truncal elongation on (L) 3''x10   - Standing rotational pulleys with CGA for safety and tactile cueing to improve (L) rotation 3''x10       Neuromuscular Re-Education   Justification: To improve neuromotor control with functional gait and gross  TrA/ES engagement.   - Seated dead bugs via marching and use of pulleys with verbal/tactile cueing to improve coordination and end range performance 3''x10   - Seated truncal rotations (B) with 6# ball and verbal cueing to improve truncal extension and end range rotation 3''x10   - Side-stepping at mirror with verbal cueing to improve weight-shifting onto (L)LE and prevent foot drag 3 steps (B) x10 with CGA for safety   - Bwd stepping static with HHA at mirror and verbal cueing to promote truncal extension with hip extension (B) 3''x10   - STS transfers with emphasis on eccentric lowering 3'', engaging hip abductors at bottom, and improving TKE/terminal hip extension via glute max/quadriceps engagement 2x5       Modalities   Declined by patient.     HEP  Access Code: [email or web address]   URL: [email or web address]   Date: 10/25/2018   Prepared by: Josiah Lobo      Exercises Sitting to Supine Roll- 10 reps- 3 sets- 1x daily- 7x weekly   Supine Posterior Pelvic Tilt- 10 reps- 3 sets- 1x daily- 7x weekly   Supine Lower Trunk Rotation- 10 reps- 3 sets- 1x daily- 7x weekly   Supine Bridge- 10 reps- 3 sets- 1x daily- 7x weekly   Clamshell- 10 reps- 3 sets- 1x daily- 7x weekly   Supine Active Straight Leg  Raise- 10 reps- 3 sets- 1x daily- 7x weekly   Mini Squat with Chair- 10 reps- 3 sets- 1x daily- 7x weekly          ---      ---   Total Time   Timed Minutes  40 minutes   Total Time  40 minutes        Assessment   Patient with marked difficulty with lateral/posterior chain engagement on (L) during performance of side/bwd stepping requiring verbal cueing to prevent foot drag and improve lateral plane truncal control. Patient also with limited (L) truncal rotation and elongation addressed with use of pulleys and seated loaded exercises. Patient with difficulty with limb coordination during seated pulleys requiring marked verbal/tactile cueing and would therefore continue to benefit from skilled PT services to address limitations in strength and gait mechanics.   Plan   Continue to progress functional gait/strengthening exercises.       Goals    Goal 1: Pt will complete TUG assessment in under 25 seconds with use of SPC, no guarding in order to improve safety navigating in home.     09/2718 28 sec  10/25/18 27 sec with SPC, no guarding needed   11/21/18 27 with AD, no guarding   - Goal progressing 12/07/18: 22'' with use of AD with near LOB with rotation to (L) -JL   Sessions: 22      Goal 2: Pt will be able to rise from chair x5 with use of UE in order to improve strength for STS transfers.     10/13/18 5xSTS with use of UE SBA for safety, 28 sec    Sessions: 16      Goal 3: Pt will demo SLS of 3 seconds BL in order to improve safety stepping up curb in community.     10/20/18 in // bars 2 sec R, 1 sec L CGA for safety   11/15/18 2 sec BL with CGA for safety   - Goal progressing 12/07/18: SLS (R) 30'' with 2 finger assist, 10'' (L) with 2 finger assist -JL   Sessions: 22      Goal 4:  Pt will demo gross hip strength of 4/5 in order to improve walking tolerance.     10/25/18 improve walking tolerance with SPC, no change in MMT   - Goal progressing 11/18/18: 4/5 throughout with limited truncal rotation and extension AROM and  strength affecting gait mechanics; CV endurance also limited per son -JL   Sessions: 22          Goal 5: Patient will demonstrate independence in prescribed HEP with proper form, sets and reps for safe discharge to an independent program.    - Goal progressing 11/17/18: Patient able to list/demo HEP exercises -JL   Sessions: 22                         Kennith Maes, DPT

## 2018-12-13 ENCOUNTER — Inpatient Hospital Stay: Payer: Medicare Other | Admitting: Physical Therapist

## 2018-12-13 DIAGNOSIS — M6281 Muscle weakness (generalized): Secondary | ICD-10-CM | POA: Diagnosis not present

## 2018-12-13 DIAGNOSIS — R262 Difficulty in walking, not elsewhere classified: Secondary | ICD-10-CM | POA: Diagnosis not present

## 2018-12-13 DIAGNOSIS — M545 Low back pain, unspecified: Secondary | ICD-10-CM

## 2018-12-13 NOTE — PT/OT Therapy Note (Signed)
Name: Rick [name] Age: 85 y.o.   Date of Service: 12/13/2018  Referring Physician: Erasmo Score, MD   Date of Injury: 09/21/2018  Date Care Plan Established/Reviewed: 11/21/2018  Date Treatment Started: 09/28/2018  End of Certification Date: 12/26/2018  Sessions in Plan of Care: 9  Surgery Date: No data was found    Visit Count: 18   Diagnosis:   1. Muscle weakness    2. Acute midline low back pain without sciatica    3. Difficulty walking        Subjective     Social Support/Occupation  Lives in: multiple level home  Lives with: adult children  Occupation: retired     Rick [name] reports that he twisted back when standing up from the floor performing stretches. Back is feeling a little sore still although better than this morning.       Precautions: hypertension  Allergies: Patient has no known allergies.     Initial Evaluation Reference and/or Current Measurements(as dated):      Gait   Walking with walking stick, fwd trunk lean, decr hip ext BL, BL shortened stride length, wide BOS      Initial Evaluation Reference and/or Current Measurements(as dated):     Functional Strength  Rise from table with UE support  IE TUG with AD SBA for safety 33 seconds   IE weight shift L/R with HHA   IE multiple attempts when shifting L/R in bed   10/13/18  5xSTS with UE support 28 sec   TUG with AD SBA for safety 28 seconds   10/25/18  TUG with AD SBA for safety 27 seconds  11/21/18  TUG with AD, SBA for safety 26 sec  12/07/18  TUG with AD and SBA for safety 22'' with near LOB with turning to (R)     Balance   10/20/18  SLS in // bars: 2 sec R, 1 sec L CGA for safety   11/15/18   SLS in // bars: 2 sec BL with CGA for safety   12/07/18  SLS in // bars: (R) 30'' with 2 finger assist, (L) 10'' with 2 finger assist     Initial R 10/25/18  R 11/18/18  R LE Strength  MMT /5 Initial  L 10/25/18  L 11/18/18  L   4 4 4+ Hip Flexion(L1/2) 4 4 4    4- 4- 4 Hip Extension 4- 4- 4   4- 4- 4+ Hip Abduction 4- 4- 4   4 4  4+ Knee Flexion  (S1) 4 4 4   4 4  4+ Knee Extension (L3) 4 4 4    3+ 4- 4 Ankle DF (L4) 3+ 4- 4      Toe Ext (L5)      (blank fields were intentionally left blank)    11/21/2018 lacking 20 deg ext BL, hypomobility BL patellae                         Treatment     Therapeutic Exercises   Justification: To improve AROM, muscle extensibility, and endurance to promote goal achievement.   -Nu Step 6 min while gathering subjective info to plan today's tx   -70# 2x10 leg press   -wall angels 10x VC to engage scap retract   -shoulder flex with 3# dowel 10x at wall   -supine LTR 10x ea   -seated pelvic tilt 10x  -supine pelvic tilt 10x TC for motion     Neuromuscular Re-Education  Justification: To improve neuromotor control with functional gait and gross TrA/ES engagement.   -high knee marching on airex 2x30"  -stepping over small hurdles in // bars 6x5 ft VC to min circumvention L   -lat pull down RTB 2x10 guarding for balance safety  -t spine rotation archer RTB 10x ea TC for rotation       Modalities   Declined by patient.     HEP  Access Code: [email or web address]   URL: [email or web address]   Date: 10/25/2018   Prepared by: Josiah Lobo      Exercises Sitting to Supine Roll- 10 reps- 3 sets- 1x daily- 7x weekly   Supine Posterior Pelvic Tilt- 10 reps- 3 sets- 1x daily- 7x weekly   Supine Lower Trunk Rotation- 10 reps- 3 sets- 1x daily- 7x weekly   Supine Bridge- 10 reps- 3 sets- 1x daily- 7x weekly   Clamshell- 10 reps- 3 sets- 1x daily- 7x weekly   Supine Active Straight Leg Raise- 10 reps- 3 sets- 1x daily- 7x weekly   Mini Squat with Chair- 10 reps- 3 sets- 1x daily- 7x weekly          ---      ---   Total Time   Timed Minutes  42 minutes   Total Time  42 minutes        Assessment   Rick [name] walks with shortened stride length with wide BOS. He lacks hip and knee flexion during gait. Small hurdles are utilized to improve balance while increasing stride length with flexion. He circumvents more with L LE. With  repetition form and confidence improves. Lumbar mobility exercises are included in today's tx in order to decr discomfort from twisting this morning. He reports slight relief with this. He will continue to beneift from skilled PT to improve functional independence and safety walking.   Plan   Continue to progress functional gait/strengthening exercises.       Goals    Goal 1: Pt will complete TUG assessment in under 25 seconds with use of SPC, no guarding in order to improve safety navigating in home.     09/2718 28 sec  10/25/18 27 sec with SPC, no guarding needed   11/21/18 27 with AD, no guarding   - Goal progressing 12/07/18: 22'' with use of AD with near LOB with rotation to (L) -JL   Sessions: 22      Goal 2: Pt will be able to rise from chair x5 with use of UE in order to improve strength for STS transfers.     10/13/18 5xSTS with use of UE SBA for safety, 28 sec    Sessions: 16      Goal 3: Pt will demo SLS of 3 seconds BL in order to improve safety stepping up curb in community.     10/20/18 in // bars 2 sec R, 1 sec L CGA for safety   11/15/18 2 sec BL with CGA for safety   - Goal progressing 12/07/18: SLS (R) 30'' with 2 finger assist, 10'' (L) with 2 finger assist -JL   Sessions: 22      Goal 4: Pt will demo gross hip strength of 4/5 in order to improve walking tolerance.     10/25/18 improve walking tolerance with SPC, no change in MMT   - Goal progressing 11/18/18: 4/5 throughout with limited truncal rotation and extension AROM and strength affecting gait mechanics; CV endurance also limited per son -JL   Sessions: 22  Goal 5: Patient will demonstrate independence in prescribed HEP with proper form, sets and reps for safe discharge to an independent program.    - Goal progressing 11/17/18: Patient able to list/demo HEP exercises -JL   Sessions: 22                         Josiah Lobo, DPT

## 2018-12-15 ENCOUNTER — Inpatient Hospital Stay: Payer: Medicare Other | Admitting: Physical Therapist

## 2018-12-15 DIAGNOSIS — M6281 Muscle weakness (generalized): Secondary | ICD-10-CM | POA: Diagnosis not present

## 2018-12-15 DIAGNOSIS — R262 Difficulty in walking, not elsewhere classified: Secondary | ICD-10-CM | POA: Diagnosis not present

## 2018-12-15 DIAGNOSIS — M545 Low back pain, unspecified: Secondary | ICD-10-CM

## 2018-12-15 NOTE — PT/OT Therapy Note (Signed)
Name: Lochlyn Pallett Age: 85 y.o.   Date of Service: 12/15/2018  Referring Physician: Erasmo Score, MD   Date of Injury: 09/21/2018  Date Care Plan Established/Reviewed: 11/21/2018  Date Treatment Started: 09/28/2018  End of Certification Date: 12/26/2018  Sessions in Plan of Care: 9  Surgery Date: No data was found    Visit Count: 19   Diagnosis:   1. Muscle weakness    2. Acute midline low back pain without sciatica    3. Difficulty walking        Subjective     Social Support/Occupation  Lives in: multiple level home  Lives with: adult children  Occupation: retired     Mr. Rackow reports that back is irritable when getting into/out of sedan. He reports greatest discomfort to low back with bending and twisting motion.       Precautions: hypertension  Allergies: Patient has no known allergies.     Initial Evaluation Reference and/or Current Measurements(as dated):      Gait   Walking with walking stick, fwd trunk lean, decr hip ext BL, BL shortened stride length, wide BOS      Initial Evaluation Reference and/or Current Measurements(as dated):     Functional Strength  Rise from table with UE support  IE TUG with AD SBA for safety 33 seconds   IE weight shift L/R with HHA   IE multiple attempts when shifting L/R in bed   10/13/18  5xSTS with UE support 28 sec   TUG with AD SBA for safety 28 seconds   10/25/18  TUG with AD SBA for safety 27 seconds  11/21/18  TUG with AD, SBA for safety 26 sec  12/07/18  TUG with AD and SBA for safety 22'' with near LOB with turning to (R)     Balance   10/20/18  SLS in // bars: 2 sec R, 1 sec L CGA for safety   11/15/18   SLS in // bars: 2 sec BL with CGA for safety   12/07/18  SLS in // bars: (R) 30'' with 2 finger assist, (L) 10'' with 2 finger assist     Initial R 10/25/18  R 11/18/18  R 12/15/18 R end of session  LE Strength  MMT /5 Initial  L 10/25/18  L 11/18/18  L 12/15/18 end of session   4 4 4+ 4 Hip Flexion(L1/2) 4 4 4 4    4- 4- 4 4 Hip Extension 4- 4- 4 4   4- 4- 4+ 4 Hip  Abduction 4- 4- 4 4   4 4  4+ 4+ Knee Flexion (S1) 4 4 4  4+   4 4 4+ 4+ Knee Extension (L3) 4 4 4  4+   3+ 4- 4 4 Ankle DF (L4) 3+ 4- 4 4       Toe Ext (L5)       (blank fields were intentionally left blank)    11/21/2018 lacking 20 deg ext BL, hypomobility BL patellae                         Treatment     Therapeutic Exercises   Justification: To improve AROM, muscle extensibility, and endurance to promote goal achievement.   -Nu Step 7 min while gathering subjective info to plan today's tx   -side stepping in // bars RTB at ankles 6x5 ft   -shoulder flex with 3# dowel with STS 15x   -10x bridging   -strength assessment  Neuromuscular Re-Education   Justification: To improve neural recruitment, balance, and proprioception   -reaching outside BOS for red weighted ball 2x30" ea   -high knee marching in // bars single HHA to mimic use of SPC 30x ea   -bwd walking in // bars 4x5 ft     Therapeutic Activity   Justification: To improve bed mobility   -log rolling to get in/out of bed 2x ea TC guiding through motion   -bridge bed mobility shifting 10x    Modalities   Declined by patient.     HEP  Access Code: [email or web address]   URL: [email or web address]   Date: 10/25/2018   Prepared by: Josiah Lobo      Exercises Sitting to Supine Roll- 10 reps- 3 sets- 1x daily- 7x weekly   Supine Posterior Pelvic Tilt- 10 reps- 3 sets- 1x daily- 7x weekly   Supine Lower Trunk Rotation- 10 reps- 3 sets- 1x daily- 7x weekly   Supine Bridge- 10 reps- 3 sets- 1x daily- 7x weekly   Clamshell- 10 reps- 3 sets- 1x daily- 7x weekly   Supine Active Straight Leg Raise- 10 reps- 3 sets- 1x daily- 7x weekly   Mini Squat with Chair- 10 reps- 3 sets- 1x daily- 7x weekly          ---      ---   Total Time   Timed Minutes  41 minutes   Total Time  41 minutes        Assessment   Mr. Ottum reports back discomfort with bed mobility today. Log rolling is reviewed. He is challenged with hooklying bed mobility .Henreitta Leber are  performed with shifting in bed in order to improve mobility in bed. Strength is assessed at the end of today's session. Strength Measurement after 40 min of exercise appears to be weaker than previous measurement. Strength will be assessed at the beginning of next session for true progress gauge.    Plan   Strength assessment at beginning of session       Goals    Goal 1: Pt will complete TUG assessment in under 25 seconds with use of SPC, no guarding in order to improve safety navigating in home.     11/21/18 27 with AD, no guarding   - Goal progressing 12/07/18: 22'' with use of AD with near LOB with rotation to (L) -JL   Sessions: 22      Goal 2: Pt will be able to rise from chair x5 with use of UE in order to improve strength for STS transfers.     10/13/18 5xSTS with use of UE SBA for safety, 28 sec    Sessions: 16      Goal 3: Pt will demo SLS of 3 seconds BL in order to improve safety stepping up curb in community.     10/20/18 in // bars 2 sec R, 1 sec L CGA for safety   11/15/18 2 sec BL with CGA for safety   - Goal progressing 12/07/18: SLS (R) 30'' with 2 finger assist, 10'' (L) with 2 finger assist -JL   Sessions: 22      Goal 4: Pt will demo gross hip strength of 4/5 in order to improve walking tolerance.     - Goal progressing 11/18/18: 4/5 throughout with limited truncal rotation and extension AROM and strength affecting gait mechanics; CV endurance also limited per son -JL  12/15/18 strength quickly fatigues with exercise, see measurements    Sessions: 22  Goal 5: Patient will demonstrate independence in prescribed HEP with proper form, sets and reps for safe discharge to an independent program.    - Goal progressing 11/17/18: Patient able to list/demo HEP exercises -JL   Sessions: 22                         Josiah Lobo, DPT

## 2018-12-20 ENCOUNTER — Inpatient Hospital Stay
Payer: Medicare Other | Attending: Student in an Organized Health Care Education/Training Program | Admitting: Physical Therapist

## 2018-12-20 DIAGNOSIS — M545 Low back pain, unspecified: Secondary | ICD-10-CM

## 2018-12-20 DIAGNOSIS — M6281 Muscle weakness (generalized): Secondary | ICD-10-CM | POA: Diagnosis not present

## 2018-12-20 DIAGNOSIS — R262 Difficulty in walking, not elsewhere classified: Secondary | ICD-10-CM | POA: Diagnosis not present

## 2018-12-20 NOTE — PT/OT Therapy Note (Signed)
Name: Rick [name] Age: 85 y.o.   Date of Service: 12/20/2018  Referring Physician: Erasmo Score, MD   Date of Injury: 09/21/2018  Date Care Plan Established/Reviewed: 11/21/2018  Date Treatment Started: 09/28/2018  End of Certification Date: 12/26/2018  Sessions in Plan of Care: 9  Surgery Date: No data was found    Visit Count: 20   Diagnosis:   1. Muscle weakness    2. Acute midline low back pain without sciatica    3. Difficulty walking        Subjective     Social Support/Occupation  Lives in: multiple level home  Lives with: adult children  Occupation: retired     Rick [name] presents today taking longer strides. He reports that back is feeling better today.       Precautions: hypertension  Allergies: Patient has no known allergies.     Initial Evaluation Reference and/or Current Measurements(as dated):      Gait   Walking with walking stick, fwd trunk lean, decr hip ext BL, BL shortened stride length, wide BOS      Initial Evaluation Reference and/or Current Measurements(as dated):     Functional Strength  Rise from table with UE support  IE TUG with AD SBA for safety 33 seconds   IE weight shift L/R with HHA   IE multiple attempts when shifting L/R in bed   10/13/18  5xSTS with UE support 28 sec   TUG with AD SBA for safety 28 seconds   10/25/18  TUG with AD SBA for safety 27 seconds  11/21/18  TUG with AD, SBA for safety 26 sec  12/07/18  TUG with AD and SBA for safety 22'' with near LOB with turning to (R)   12/20/18  TUG with AD with SBA for safety 19" with turning R, 21" turning L, no LOB   TUG without AD SBA for safety 20"     Balance   10/20/18  SLS in // bars: 2 sec R, 1 sec L CGA for safety   11/15/18   SLS in // bars: 2 sec BL with CGA for safety   12/07/18  SLS in // bars: (R) 30'' with 2 finger assist, (L) 10'' with 2 finger assist     Initial R 10/25/18  R 11/18/18  R 12/15/18 R end of session  LE Strength  MMT /5 Initial  L 10/25/18  L 11/18/18  L 12/15/18 end of session   4 4 4+ 4 Hip Flexion(L1/2) 4  4 4 4    4- 4- 4 4 Hip Extension 4- 4- 4 4   4- 4- 4+ 4 Hip Abduction 4- 4- 4 4   4 4  4+ 4+ Knee Flexion (S1) 4 4 4  4+   4 4 4+ 4+ Knee Extension (L3) 4 4 4  4+   3+ 4- 4 4 Ankle DF (L4) 3+ 4- 4 4       Toe Ext (L5)       (blank fields were intentionally left blank)    11/21/2018 lacking 20 deg ext BL, hypomobility BL patellae                         Treatment     Therapeutic Exercises   Justification: To improve AROM, muscle extensibility, and endurance to promote goal achievement.   -Nu Step 7 min while gathering subjective info to plan today's tx   -10x bridging   -SL clams RTB 10x ea  TC for positioning   -TUG assessment with and without AD   -SLS assessment     Neuromuscular Re-Education   Justification: To improve neural recruitment, balance, and proprioception   -NBOS 3x20" in // bars   -NBOS added cross body reaching 10x ea // bars     Gait Training   Justification: To improve gait mechanics and safety with stair navigation   -split stance weight shifts 10x ea VC for mid foot flex  -stepping over small hurdles in // bars 6x5 ft to incr stride length   -step up 6 in step 10x ea VC for upright posture  -step down 4-6 in step 10x ea BL progressing height as tol     Modalities   Declined by patient.     HEP  Access Code: [email or web address]   URL: [email or web address]   Date: 10/25/2018   Prepared by: Josiah Lobo      Exercises Sitting to Supine Roll- 10 reps- 3 sets- 1x daily- 7x weekly   Supine Posterior Pelvic Tilt- 10 reps- 3 sets- 1x daily- 7x weekly   Supine Lower Trunk Rotation- 10 reps- 3 sets- 1x daily- 7x weekly   Supine Bridge- 10 reps- 3 sets- 1x daily- 7x weekly   Clamshell- 10 reps- 3 sets- 1x daily- 7x weekly   Supine Active Straight Leg Raise- 10 reps- 3 sets- 1x daily- 7x weekly   Mini Squat with Chair- 10 reps- 3 sets- 1x daily- 7x weekly          ---      ---   Total Time   Timed Minutes  44 minutes   Total Time  44 minutes        Assessment   Rick [name] demonstrates  improved stability with TUG assessment with and without AD. He continues to revert to shuffling gait with reaching for furniture for stability when in crowded spaces. In open spaces he is able to move more confidently. He has improved performance with stepping over hurdles in // bars with decreased compensatory strategies. He is making good functional progress towards goals. He will be ready for [place] at next visit.   Plan   [place] next visit       Goals    Goal 1: Pt will complete TUG assessment in under 25 seconds with use of SPC, no guarding in order to improve safety navigating in home.     11/21/18 27 with AD, no guarding   - Goal progressing 12/07/18: 22'' with use of AD with near LOB with rotation to (L) -JL  12/20/18 with SPC 19", without AD 20"   Sessions: 22      Goal 2: Pt will be able to rise from chair x5 with use of UE in order to improve strength for STS transfers.     10/13/18 5xSTS with use of UE SBA for safety, 28 sec    Sessions: 16      Goal 3: Pt will demo SLS of 3 seconds BL in order to improve safety stepping up curb in community.     10/20/18 in // bars 2 sec R, 1 sec L CGA for safety   11/15/18 2 sec BL with CGA for safety   - Goal progressing 12/07/18: SLS (R) 30'' with 2 finger assist, 10'' (L) with 2 finger assist -JL   Sessions: 22      Goal 4: Pt will demo gross hip strength of 4/5 in order to improve walking tolerance.     -  Goal progressing 11/18/18: 4/5 throughout with limited truncal rotation and extension AROM and strength affecting gait mechanics; CV endurance also limited per son -JL  12/15/18 strength quickly fatigues with exercise, see measurements    Sessions: 22          Goal 5: Patient will demonstrate independence in prescribed HEP with proper form, sets and reps for safe discharge to an independent program.    - Goal progressing 11/17/18: Patient able to list/demo HEP exercises -JL   Sessions: 22                         Josiah Lobo, DPT

## 2018-12-22 ENCOUNTER — Inpatient Hospital Stay: Payer: Medicare Other | Admitting: Physical Therapist

## 2018-12-23 ENCOUNTER — Ambulatory Visit (INDEPENDENT_AMBULATORY_CARE_PROVIDER_SITE_OTHER): Payer: Medicare Other | Admitting: Cardiovascular Disease

## 2018-12-23 ENCOUNTER — Encounter (INDEPENDENT_AMBULATORY_CARE_PROVIDER_SITE_OTHER): Payer: Self-pay | Admitting: Cardiovascular Disease

## 2018-12-23 ENCOUNTER — Inpatient Hospital Stay: Payer: Medicare Other | Admitting: Rehabilitative and Restorative Service Providers"

## 2018-12-23 VITALS — BP 127/66 | HR 63 | Temp 98.0°F | Wt 148.2 lb

## 2018-12-23 DIAGNOSIS — R262 Difficulty in walking, not elsewhere classified: Secondary | ICD-10-CM | POA: Diagnosis not present

## 2018-12-23 DIAGNOSIS — M545 Low back pain, unspecified: Secondary | ICD-10-CM

## 2018-12-23 DIAGNOSIS — I7045 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: Secondary | ICD-10-CM | POA: Diagnosis not present

## 2018-12-23 DIAGNOSIS — M6281 Muscle weakness (generalized): Secondary | ICD-10-CM | POA: Diagnosis not present

## 2018-12-23 DIAGNOSIS — I493 Ventricular premature depolarization: Secondary | ICD-10-CM | POA: Diagnosis not present

## 2018-12-23 DIAGNOSIS — I350 Nonrheumatic aortic (valve) stenosis: Secondary | ICD-10-CM | POA: Diagnosis not present

## 2018-12-23 NOTE — Progress Notes (Signed)
Name:Rick [name] Age: 85 y.o.   Date of Service: 12/23/2018  Referring Physician: Erasmo Score, MD   Date of Injury: 09/21/2018  Date Care Plan Established/Reviewed: 11/21/2018  Date Treatment Started: 09/28/2018  End of Certification Date: 12/26/2018  Sessions in Plan of Care: 9  Surgery Date: No data was found      Visit Count: 21   Diagnosis:   1. Muscle weakness    2. Acute midline low back pain without sciatica    3. Difficulty walking             Precautions: hypertension  Allergies: Patient has no known allergies.    No past medical history on file.     Subjective     Rick [name] reports he hasn't twisted his back where he feels the pain today.    Initial Evaluation Reference and/or Current Measurements(as dated):  FOTO emailed to patient 12/23/18    Gait   Walking with walking stick, fwd trunk lean, decr hip ext BL, BL shortened stride length, wide BOS      Initial Evaluation Reference and/or Current Measurements(as dated):     Functional Strength  Rise from table with UE support  IE TUG with AD SBA for safety 33 seconds   IE weight shift L/R with HHA   IE multiple attempts when shifting L/R in bed   10/13/18  5xSTS with UE support 28 sec   TUG with AD SBA for safety 28 seconds   10/25/18  TUG with AD SBA for safety 27 seconds  11/21/18  TUG with AD, SBA for safety 26 sec  12/07/18  TUG with AD and SBA for safety 22'' with near LOB with turning to (R)   12/20/18  TUG with AD with SBA for safety 19" with turning R, 21" turning L, no LOB   TUG without AD SBA for safety 20"       12/20/18: sit to stand x 5 with UE assistance    Balance   10/20/18  SLS in // bars: 2 sec R, 1 sec L CGA for safety   11/15/18   SLS in // bars: 2 sec BL with CGA for safety   12/07/18  SLS in // bars: (R) 30'' with 2 finger assist, (L) 10'' with 2 finger assist     Initial R 10/25/18  R 11/18/18  R 12/23/18/20 R  LE Strength  MMT /5 Initial  L 10/25/18  L 11/18/18  L 12/23/18   4 4  4+ 4+ Hip Flexion(L1/2) 4 4 4 4    4- 4- 4 4 Hip Extension 4-  4- 4 4   4- 4- 4+ 4+ Hip Abduction 4- 4- 4 4   4 4  4+ 5 Knee Flexion (S1) 4 4 4 5   4 4  4+ 5 Knee Extension (L3) 4 4 4 5    3+ 4- 4 4+ Ankle DF (L4) 3+ 4- 4 4       Toe Ext (L5)       (blank fields were intentionally left blank)    11/21/2018 lacking 20 deg ext BL, hypomobility BL patellae     Assessment   Rick [name] has been seen in our clinic for 21 visits for LBP. He has made progress with LE strength and functional mobility as well as subjective reports of improvement in function of lumbar spine. At this time he has demonstrated understanding of HEP and is d/c from PT.  Plan   D/C from PT                    ---      ---  Total Time   Timed Minutes  39 minutes   Total Time  39 minutes           Goals    Goal 1: Pt will complete TUG assessment in under 25 seconds with use of SPC, no guarding in order to improve safety navigating in home.     11/21/18 27 with AD, no guarding   - Goal progressing 12/07/18: 22'' with use of AD with near LOB with rotation to (L) -JL  12/20/18 with SPC 19", without AD 20"   Sessions: 22      Goal 2: Pt will be able to rise from chair x5 with use of UE in order to improve strength for STS transfers.     10/13/18 5xSTS with use of UE SBA for safety, 28 sec   Met 12/23/18 JP   Sessions: 16      Goal 3: Pt will demo SLS of 3 seconds BL in order to improve safety stepping up curb in community.     10/20/18 in // bars 2 sec R, 1 sec L CGA for safety   11/15/18 2 sec BL with CGA for safety   - Goal progressing 12/07/18: SLS (R) 30'' with 2 finger assist, 10'' (L) with 2 finger assist -JL     Sessions: 22      Goal 4: Pt will demo gross hip strength of 4/5 in order to improve walking tolerance.     - Goal progressing 11/18/18: 4/5 throughout with limited truncal rotation and extension AROM and strength affecting gait mechanics; CV endurance also limited per son -JL  12/15/18 strength quickly fatigues with exercise, see measurements     12/23/18 MET   Sessions: 22          Goal  5: Patient will demonstrate independence in prescribed HEP with proper form, sets and reps for safe discharge to an independent program.    - Goal progressing 11/17/18: Patient able to list/demo HEP exercises -JL   Sessions: 22                         Fransico Setters, DPT

## 2018-12-23 NOTE — Progress Notes (Signed)
[facility] OFFICE VISIT    I had the pleasure of seeing Mr. Wege today for cardiovascular follow up. He is a pleasant 85 y.o. male with a history of CABG , severe AV stenosis  who presents for CV evaluation. He does PT daily and uses a cane regularly. He denies any dizziness with standing , no passing out     MEDICATIONS: He has a current medication list which includes the following prescription(s): aspirin ec - Take 81 mg by mouth daily and memantine.    REVIEW OF SYSTEMS: All other systems reviewed and negative except as stated above.    PHYSICAL EXAMINATION  General Appearance: A well-appearing male in no acute distress.   Vital Signs: BP 127/66 (BP Site: Left arm, Patient Position: Sitting, Cuff Size: Medium)    Pulse 63    Temp 98 F (36.7 C)    Wt 67.2 kg (148 lb 3.2 oz)    HEENT: Sclera anicteric, conjunctiva without pallor, moist mucous membranes, normal dentition.   Neck: Supple without jugular venous distention. Thyroid nonpalpable. Normal carotid upstrokes without bruits.  Chest: Clear to auscultation bilaterally with good air movement and respiratory effort and no wheezes, rales, or rhonchi  Cardiovascular: Normal S1 and physiologically split S2 with systolic  murmurs, gallops or rub. PMI of normal size and nondisplaced.   Abdomen: Soft, nontender. No organomegaly.  No pulsatile masses or bruits.    Extremities: Warm without edema. All peripheral pulses are full and equal.  Skin: No rash, xanthoma or xanthelasma.   Neuro: Alert and oriented x3. Grossly intact. Strength is symmetrical. Normal mood and affect.     ZOX:WRUEA rhythm Occ PVC' trigeminy.   LABS: none     IMPRESSION/RECOMMENDATIONS: Mr. Ogrodnik is a 85 y.o. male who presents for follow up.   Hx AS- severe , but with his advanced age , no sig symptoms. Will check labs, lipid panel . F/u Dr Dr Arlester Marker and Dr Ezzard Standing in 2 months. If he has dizziness, SOB above baseline, or any CP with exertion , will get in touch sooner.    Based  on his age and hx CABG  Will get AAA screen.        Fortino Sic, MD [facility]

## 2018-12-23 NOTE — PT/OT Therapy Note (Signed)
Name: Rick [name] Age: 85 y.o.   Date of Service: 12/23/2018  Referring Physician: Erasmo Score, MD   Date of Injury: 09/21/2018  Date Care Plan Established/Reviewed: 11/21/2018  Date Treatment Started: 09/28/2018  End of Certification Date: 12/26/2018  Sessions in Plan of Care: 9  Surgery Date: No data was found    Visit Count: 21   Diagnosis:   1. Muscle weakness    2. Acute midline low back pain without sciatica    3. Difficulty walking        Subjective     Social Support/Occupation  Lives in: multiple level home  Lives with: adult children  Occupation: retired     Rick [name] reports he hasn't twisted his back where he feels the pain today.      Precautions: hypertension  Allergies: Patient has no known allergies.     Initial Evaluation Reference and/or Current Measurements(as dated):  FOTO emailed to patient 12/23/18    Gait   Walking with walking stick, fwd trunk lean, decr hip ext BL, BL shortened stride length, wide BOS      Initial Evaluation Reference and/or Current Measurements(as dated):     Functional Strength  Rise from table with UE support  IE TUG with AD SBA for safety 33 seconds   IE weight shift L/R with HHA   IE multiple attempts when shifting L/R in bed   10/13/18  5xSTS with UE support 28 sec   TUG with AD SBA for safety 28 seconds   10/25/18  TUG with AD SBA for safety 27 seconds  11/21/18  TUG with AD, SBA for safety 26 sec  12/07/18  TUG with AD and SBA for safety 22'' with near LOB with turning to (R)   12/20/18  TUG with AD with SBA for safety 19" with turning R, 21" turning L, no LOB   TUG without AD SBA for safety 20"       12/20/18: sit to stand x 5 with UE assistance    Balance   10/20/18  SLS in // bars: 2 sec R, 1 sec L CGA for safety   11/15/18   SLS in // bars: 2 sec BL with CGA for safety   12/07/18  SLS in // bars: (R) 30'' with 2 finger assist, (L) 10'' with 2 finger assist     Initial R 10/25/18  R 11/18/18  R 12/23/18/20 R  LE Strength  MMT /5 Initial  L 10/25/18  L 11/18/18  L 12/23/18    4 4  4+ 4+ Hip Flexion(L1/2) 4 4 4 4    4- 4- 4 4 Hip Extension 4- 4- 4 4   4- 4- 4+ 4+ Hip Abduction 4- 4- 4 4   4 4  4+ 5 Knee Flexion (S1) 4 4 4 5   4 4  4+ 5 Knee Extension (L3) 4 4 4 5    3+ 4- 4 4+ Ankle DF (L4) 3+ 4- 4 4       Toe Ext (L5)       (blank fields were intentionally left blank)    11/21/2018 lacking 20 deg ext BL, hypomobility BL patellae                         Treatment     Therapeutic Exercises   Justification: To improve AROM, muscle extensibility, and endurance to promote goal achievement.   -Nu Step 7 min while gathering subjective info to plan today's tx   -  bridging10x3 with PT approximation through R LE for gltue facilitation  -SL clams RTB 10x 3 ea TC for positioning   -Review and update HEP  -Measurements taken today    Neuromuscular Re-Education   Justification: To improve  balance, and proprioception   -split stance weight shifts 10x ea VC for mid foot flex for improved dynamic balance  -stepping over small hurdles in // bars 6x5 ft to incr stride length   -step up 6 in step 10x ea VC for upright posture    HEP  Access Code: [email or web address]   URL: [email or web address]   Date: 12/23/2018   Prepared by: Neilson Oehlert      Exercises   Sitting to Supine Roll - 10 reps - 3 sets - 1x daily - 7x weekly   Supine Lower Trunk Rotation - 10 reps - 3 sets - 1x daily - 7x weekly   Supine Bridge - 10 reps - 3 sets - 1x daily - 7x weekly   Clamshell - 10 reps - 3 sets - 1x daily - 7x weekly   Supine Active Straight Leg Raise - 10 reps - 3 sets - 1x daily - 7x weekly   Standing March with Counter Support - 20 reps - 2 sets - 5x weekly   Mini Squat with Chair - 10 reps - 3 sets - 1x daily - 7x weekly           ---      ---   Total Time   Timed Minutes  39 minutes   Total Time  39 minutes        Assessment   Rick [name] has been seen in our clinic for 21 visits for LBP. He has made progress with LE strength and functional mobility as well as subjective reports of  improvement in function of lumbar spine. At this time he has demonstrated understanding of HEP and is d/c from PT.  Plan   D/C from PT      Goals    Goal 1: Pt will complete TUG assessment in under 25 seconds with use of SPC, no guarding in order to improve safety navigating in home.     11/21/18 27 with AD, no guarding   - Goal progressing 12/07/18: 22'' with use of AD with near LOB with rotation to (L) -JL  12/20/18 with SPC 19", without AD 20"   Sessions: 22      Goal 2: Pt will be able to rise from chair x5 with use of UE in order to improve strength for STS transfers.     10/13/18 5xSTS with use of UE SBA for safety, 28 sec   Met 12/23/18 JP   Sessions: 16      Goal 3: Pt will demo SLS of 3 seconds BL in order to improve safety stepping up curb in community.     10/20/18 in // bars 2 sec R, 1 sec L CGA for safety   11/15/18 2 sec BL with CGA for safety   - Goal progressing 12/07/18: SLS (R) 30'' with 2 finger assist, 10'' (L) with 2 finger assist -JL     Sessions: 22      Goal 4: Pt will demo gross hip strength of 4/5 in order to improve walking tolerance.     - Goal progressing 11/18/18: 4/5 throughout with limited truncal rotation and extension AROM and strength affecting gait mechanics; CV endurance also limited per son -JL  12/15/18 strength quickly fatigues with exercise,  see measurements     12/23/18 MET   Sessions: 22          Goal 5: Patient will demonstrate independence in prescribed HEP with proper form, sets and reps for safe discharge to an independent program.    - Goal progressing 11/17/18: Patient able to list/demo HEP exercises -JL   Sessions: 22                         Fransico Setters, DPT

## 2018-12-27 ENCOUNTER — Inpatient Hospital Stay: Payer: Medicare Other | Admitting: Physical Therapist

## 2018-12-29 ENCOUNTER — Inpatient Hospital Stay: Payer: Medicare Other | Admitting: Physical Therapist

## 2018-12-30 ENCOUNTER — Other Ambulatory Visit (FREE_STANDING_LABORATORY_FACILITY): Payer: Medicare Other

## 2018-12-30 DIAGNOSIS — I350 Nonrheumatic aortic (valve) stenosis: Secondary | ICD-10-CM | POA: Diagnosis not present

## 2018-12-30 DIAGNOSIS — I7045 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: Secondary | ICD-10-CM | POA: Diagnosis not present

## 2018-12-30 LAB — COMPREHENSIVE METABOLIC PANEL
ALT: 7 U/L (ref 0–55)
AST (SGOT): 16 U/L (ref 5–34)
Albumin/Globulin Ratio: 1.2 (ref 0.9–2.2)
Albumin: 4.3 g/dL (ref 3.5–5.0)
Alkaline Phosphatase: 61 U/L (ref 38–106)
Anion Gap: 8 (ref 5.0–15.0)
BUN: 26 mg/dL (ref 9.0–28.0)
Bilirubin, Total: 0.6 mg/dL (ref 0.2–1.2)
CO2: 28 mEq/L (ref 21–29)
Calcium: 9.9 mg/dL (ref 7.9–10.2)
Chloride: 108 mEq/L (ref 100–111)
Creatinine: 1.5 mg/dL (ref 0.5–1.5)
Globulin: 3.6 g/dL (ref 2.0–3.7)
Glucose: 89 mg/dL (ref 70–100)
Potassium: 4.6 mEq/L (ref 3.5–5.1)
Protein, Total: 7.9 g/dL (ref 6.0–8.3)
Sodium: 144 mEq/L (ref 136–145)

## 2018-12-30 LAB — CBC
Absolute NRBC: 0 10*3/uL (ref 0.00–0.00)
Hematocrit: 43.2 % (ref 37.6–49.6)
Hgb: 13.7 g/dL (ref 12.5–17.1)
MCH: 32.8 pg (ref 25.1–33.5)
MCHC: 31.7 g/dL (ref 31.5–35.8)
MCV: 103.3 fL — ABNORMAL HIGH (ref 78.0–96.0)
MPV: 12.5 fL (ref 8.9–12.5)
Nucleated RBC: 0 /100 WBC (ref 0.0–0.0)
Platelets: 127 10*3/uL — ABNORMAL LOW (ref 142–346)
RBC: 4.18 10*6/uL — ABNORMAL LOW (ref 4.20–5.90)
RDW: 13 % (ref 11–15)
WBC: 4.45 10*3/uL (ref 3.10–9.50)

## 2018-12-30 LAB — LIPID PANEL
Cholesterol / HDL Ratio: 2.9
Cholesterol: 222 mg/dL — ABNORMAL HIGH (ref 0–199)
HDL: 76 mg/dL (ref 40–9999)
LDL Calculated: 129 mg/dL — ABNORMAL HIGH (ref 0–99)
Triglycerides: 85 mg/dL (ref 34–149)
VLDL Calculated: 17 mg/dL (ref 10–40)

## 2018-12-30 LAB — HEMOLYSIS INDEX: Hemolysis Index: 6 (ref 0–18)

## 2018-12-30 LAB — GFR: EGFR: 43.7

## 2019-01-02 ENCOUNTER — Encounter (INDEPENDENT_AMBULATORY_CARE_PROVIDER_SITE_OTHER): Payer: Self-pay | Admitting: Student in an Organized Health Care Education/Training Program

## 2019-01-19 ENCOUNTER — Ambulatory Visit (INDEPENDENT_AMBULATORY_CARE_PROVIDER_SITE_OTHER): Payer: Medicare Other

## 2019-01-19 DIAGNOSIS — I350 Nonrheumatic aortic (valve) stenosis: Secondary | ICD-10-CM | POA: Diagnosis not present

## 2019-01-19 DIAGNOSIS — I7045 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: Secondary | ICD-10-CM | POA: Diagnosis not present

## 2019-01-19 DIAGNOSIS — I493 Ventricular premature depolarization: Secondary | ICD-10-CM | POA: Diagnosis not present

## 2019-02-17 DIAGNOSIS — I499 Cardiac arrhythmia, unspecified: Secondary | ICD-10-CM

## 2019-02-17 HISTORY — DX: Cardiac arrhythmia, unspecified: I49.9

## 2019-03-09 ENCOUNTER — Telehealth (INDEPENDENT_AMBULATORY_CARE_PROVIDER_SITE_OTHER): Payer: Medicare Other | Admitting: Student in an Organized Health Care Education/Training Program

## 2019-03-09 ENCOUNTER — Encounter (INDEPENDENT_AMBULATORY_CARE_PROVIDER_SITE_OTHER): Payer: Self-pay | Admitting: Student in an Organized Health Care Education/Training Program

## 2019-03-09 DIAGNOSIS — R058 Other specified cough: Secondary | ICD-10-CM | POA: Insufficient documentation

## 2019-03-09 DIAGNOSIS — R05 Cough: Secondary | ICD-10-CM

## 2019-03-09 DIAGNOSIS — I7045 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: Secondary | ICD-10-CM | POA: Insufficient documentation

## 2019-03-09 NOTE — Progress Notes (Signed)
Patient ID: Rick [name]  is a 86 y.o.  male.     Verbal consent has been obtained from the patient to conduct a video visit encounter to minimize exposure to COVID-19:  Yes     Chief Complaint   Patient presents with    Cough     persistent cough       Cough  This is a chronic problem. The current episode started more than 1 month ago. The problem has been unchanged. The cough is non-productive. Associated symptoms include nasal congestion and rhinorrhea. Pertinent negatives include no chills, fever, myalgias, shortness of breath or wheezing.       The following portions of the patient's history were reviewed and updated as appropriate: current medications, allergies, past family history, past medical history, past social history, past surgical history and problem list.     Review of Systems   Constitutional: Negative for chills and fever.   HENT: Positive for rhinorrhea.    Respiratory: Positive for cough. Negative for shortness of breath and wheezing.    Musculoskeletal: Negative for myalgias.          OBJECTIVE     Physical exam limited due to pandemic    There were no vitals taken for this visit.    Physical Exam    GENERAL : alert and in no distress   HEENT: ncat, no sinus pressure  LUNGS: unlabored breathing  PSYCH: Normal mood and affect, linear thought process, appropriate interactions, no SI/hallucinations      ASSESSMENT      Rick [name] is a 86 y.o. male with   1. Dry cough  XR Chest 2 Views          PLAN     I spent 15 mins during this encounter discussing/counseling/coordinating care for the following medical problem(s)     1. Dry cough  -Postnasal drip, no other symptoms concerning for GERD/CHF/acute infection, trial of second-generation antihistamine, consider chest x-ray if persistent  - XR Chest 2 Views; Future    Call if symptoms persist, worsen, or change.  Call with updates/questions/concerns.    F/u PRN    Medication list reviewed with patient and updated as indicated.  Risk & Benefits  of any new medication(s) were explained to the patient who verbalized understanding & agreed to the treatment plan.  Patient given a printed copy of AVS. Patient verbalized understanding of instructions given.

## 2019-03-09 NOTE — Progress Notes (Signed)
Have you seen any specialists/other providers since your last visit with [place]?    No    Arm preference verified?   No    The patient is due for influenza vaccine, shingles vaccine, pneumonia vaccine and mwv, ad

## 2019-03-30 ENCOUNTER — Telehealth (INDEPENDENT_AMBULATORY_CARE_PROVIDER_SITE_OTHER): Payer: Medicare Other | Admitting: Student in an Organized Health Care Education/Training Program

## 2019-04-03 ENCOUNTER — Other Ambulatory Visit (INDEPENDENT_AMBULATORY_CARE_PROVIDER_SITE_OTHER): Payer: Self-pay | Admitting: Cardiovascular Disease

## 2019-04-03 ENCOUNTER — Ambulatory Visit (INDEPENDENT_AMBULATORY_CARE_PROVIDER_SITE_OTHER): Payer: Medicare Other | Admitting: Cardiovascular Disease

## 2019-04-03 ENCOUNTER — Encounter (INDEPENDENT_AMBULATORY_CARE_PROVIDER_SITE_OTHER): Payer: Self-pay | Admitting: Cardiovascular Disease

## 2019-04-03 VITALS — BP 176/75 | HR 89 | Temp 97.4°F | Resp 16 | Wt 156.0 lb

## 2019-04-03 DIAGNOSIS — I493 Ventricular premature depolarization: Secondary | ICD-10-CM

## 2019-04-03 DIAGNOSIS — I350 Nonrheumatic aortic (valve) stenosis: Secondary | ICD-10-CM

## 2019-04-03 MED ORDER — METOPROLOL SUCCINATE ER 25 MG PO TB24
25.00 mg | ORAL_TABLET | Freq: Every day | ORAL | 2 refills | Status: DC
Start: 2019-04-03 — End: 2019-06-28

## 2019-04-03 MED ORDER — ROSUVASTATIN CALCIUM 10 MG PO TABS
10.0000 mg | ORAL_TABLET | Freq: Every day | ORAL | 3 refills | Status: DC
Start: 2019-04-03 — End: 2020-04-02

## 2019-04-03 NOTE — Progress Notes (Addendum)
[facility] OFFICE VISIT    I had the pleasure of seeing Mr. Bruner today for cardiovascular follow up. He is a pleasant 86 y.o. male with a history of CABG 2006  who presents for CV evaluation. He has severe AV stenosis. After  100 ft he has to take a break and stop. He is not having any CP or syncope . He has primarily fatigue with exercise.      MEDICATIONS: He has a current medication list which includes the following prescription(s): aspirin ec - Take 81 mg by mouth daily and memantine.    REVIEW OF SYSTEMS: All other systems reviewed and negative except as stated above.    PHYSICAL EXAMINATION  General Appearance: A well-appearing male in no acute distress.   Vital Signs: BP 176/75 (BP Site: Left arm, Patient Position: Sitting, Cuff Size: Medium)    Pulse 89    Temp 97.4 F (36.3 C)    Resp 16    Wt 70.8 kg (156 lb)    SpO2 98%    HEENT: Sclera anicteric, conjunctiva without pallor, moist mucous membranes, normal dentition.   Neck: Supple without jugular venous distention. Thyroid nonpalpable. Normal carotid upstrokes without bruits.  Chest: Clear to auscultation bilaterally with good air movement and respiratory effort and no wheezes, rales, or rhonchi  Cardiovascular: Normal S1 and physiologically split S2 with 3/6 systolic Murmur at URSB,+ gallops or rub. PMI of normal size and nondisplaced.   Abdomen: Soft, nontender. No organomegaly.  No pulsatile masses or bruits.    Extremities: Warm without edema. All peripheral pulses are full and equal.  Skin: No rash, xanthoma or xanthelasma.   Neuro: Alert and oriented x3. Grossly intact. Strength is symmetrical. Normal mood and affect.     ECG: nsr , PVC's  LABS: LDL129 mg /dl.     IMPRESSION/RECOMMENDATIONS: Mr. Topf is a 86 y.o. male who presents for follow up.  CAD- will keep on statin , add toprol 25 mg qd .  Aortic stenosis- will need echo ASAP and referral to Dr Penny Pia for possible TAVR.  F/u with me after TAVR         Fortino Sic,  MD [facility]

## 2019-04-04 ENCOUNTER — Encounter (INDEPENDENT_AMBULATORY_CARE_PROVIDER_SITE_OTHER): Payer: Self-pay

## 2019-04-04 ENCOUNTER — Ambulatory Visit
Admission: RE | Admit: 2019-04-04 | Discharge: 2019-04-04 | Disposition: A | Discharge status: Home / Self Care | Payer: Medicare Other | Source: Ambulatory Visit | Attending: Cardiovascular Disease | Admitting: Cardiovascular Disease

## 2019-04-04 DIAGNOSIS — I350 Nonrheumatic aortic (valve) stenosis: Secondary | ICD-10-CM

## 2019-04-04 DIAGNOSIS — I493 Ventricular premature depolarization: Secondary | ICD-10-CM | POA: Insufficient documentation

## 2019-04-04 DIAGNOSIS — I083 Combined rheumatic disorders of mitral, aortic and tricuspid valves: Secondary | ICD-10-CM | POA: Insufficient documentation

## 2019-04-10 ENCOUNTER — Ambulatory Visit (INDEPENDENT_AMBULATORY_CARE_PROVIDER_SITE_OTHER): Payer: Medicare Other | Admitting: Student in an Organized Health Care Education/Training Program

## 2019-04-10 ENCOUNTER — Encounter (INDEPENDENT_AMBULATORY_CARE_PROVIDER_SITE_OTHER): Payer: Self-pay | Admitting: Student in an Organized Health Care Education/Training Program

## 2019-04-10 VITALS — BP 119/68 | HR 84 | Temp 97.6°F | Resp 12 | Ht 63.0 in | Wt 151.0 lb

## 2019-04-10 DIAGNOSIS — G8929 Other chronic pain: Secondary | ICD-10-CM

## 2019-04-10 DIAGNOSIS — M545 Low back pain: Secondary | ICD-10-CM

## 2019-04-10 MED ORDER — TRAMADOL HCL 50 MG PO TABS
50.00 mg | ORAL_TABLET | Freq: Three times a day (TID) | ORAL | 0 refills | Status: AC | PRN
Start: 2019-04-10 — End: 2019-04-17

## 2019-04-10 MED ORDER — LIDOCAINE 5 % EX PTCH
1.00 | MEDICATED_PATCH | CUTANEOUS | 0 refills | Status: DC
Start: 2019-04-10 — End: 2019-06-28

## 2019-04-10 NOTE — Progress Notes (Signed)
Patient ID: Rick [name]  is a 86 y.o.  male.     Chief Complaint   Patient presents with    Back Pain      Back Pain  This is a new problem. The current episode started more than 1 month ago. The problem occurs daily. The problem is unchanged. The pain is present in the lumbar spine. The quality of the pain is described as aching. The pain does not radiate. The pain is moderate. The symptoms are aggravated by bending and twisting. Pertinent negatives include no bladder incontinence, bowel incontinence, numbness, tingling or weakness. Treatments tried: PT. The treatment provided moderate relief.       The following portions of the patient's history were reviewed and updated as appropriate: current medications, allergies, past family history, past medical history, past social history, past surgical history and problem list.     Review of Systems   Gastrointestinal: Negative for bowel incontinence.   Genitourinary: Negative for bladder incontinence.   Musculoskeletal: Positive for back pain.   Neurological: Negative for tingling, weakness and numbness.          OBJECTIVE     BP 119/68    Pulse 84    Temp 97.6 F (36.4 C) (Tympanic)    Resp 12    Ht 1.6 m (5\' 3" )    Wt 68.5 kg (151 lb) Comment: with shoes   BMI 26.75 kg/m     Physical Exam    GENERAL : alert and in no distress   SKIN: warm and dry, w/o rash, normal turgor   Head: ncat  [id number] and tightness of lumbar paraspinal, decreased ROM, negative leg raise, sensation intact to touch distally, 5/5 strength of LE         ASSESSMENT      Rick [name] is a 86 y.o. male with   1. Chronic bilateral low back pain without sciatica  lidocaine (Lidoderm) 5 %    traMADol (Ultram) 50 MG tablet    Ambulatory referral to Physical Medicine Rehab    Referral to Physical Therapy-[facility]          PLAN     1. Chronic bilateral low back pain without sciatica  - Encourage home stretching exercise, avoid mod-heavy lifting, prn Tylenol/analgesic/PT.   - lidocaine  (Lidoderm) 5 %; Place 1 patch onto the skin every 24 hours Remove & Discard patch within 12 hours or as directed by MD  Dispense: 30 each; Refill: 0  - traMADol (Ultram) 50 MG tablet; Take 1 tablet (50 mg total) by mouth every 8 (eight) hours as needed for Pain  Dispense: 20 tablet; Refill: 0  - Ambulatory referral to Physical Medicine Rehab  - Referral to Physical Therapy-[facility]     Call if symptoms persist, worsen, or change.  Call with updates/questions/concerns.    F/u PRN    Medication list reviewed with patient and updated as indicated.  Risk & Benefits of any new medication(s) were explained to the patient who verbalized understanding & agreed to the treatment plan.  Patient given a printed copy of AVS. Patient verbalized understanding of instructions given.

## 2019-04-10 NOTE — Progress Notes (Signed)
Have you seen any specialists/other providers since your last visit with us?    No    Arm preference verified?   Yes    The patient is due for influenza vaccine, shingles vaccine, pneumonia vaccine and mawv, adf

## 2019-04-12 ENCOUNTER — Encounter: Payer: Self-pay | Admitting: Physical Therapist

## 2019-04-12 ENCOUNTER — Inpatient Hospital Stay
Payer: Medicare Other | Attending: Student in an Organized Health Care Education/Training Program | Admitting: Physical Therapist

## 2019-04-12 VITALS — BP 129/79 | HR 69

## 2019-04-12 DIAGNOSIS — M545 Low back pain, unspecified: Secondary | ICD-10-CM

## 2019-04-12 NOTE — PT/OT Therapy Note (Deleted)
Name: Rick [name] Age: 86 y.o.   Date of Service: 04/12/2019  Referring Physician: Erasmo Score, MD   Date of Injury: 04/10/2019  Date Care Plan Established/Reviewed: 04/12/2019  Date Treatment Started: 04/12/2019  End of Certification Date: 07/10/2019  Sessions in Plan of Care: 16  Surgery Date: No data was found    Visit Count: 1   Diagnosis:   1. Acute bilateral low back pain without sciatica             Precautions: No data was found  Allergies: Patient has no known allergies.         BP: 129/79 Heart Rate: 69          ---      ---   Total Time   Timed Minutes  15 minutes   Untimed Minutes  30 minutes   Total Time  45 minutes           Goals    Goal 1: Patient will improve L/S rotation AROM to >/= 75% to improve performance of supine-to-sit and sit-to-supine transfers.    Sessions: 8      Goal 2: Patient will improve (B) hip/knee extensor strength to 4/5 or greater to assist with ease of stair negotiation.    Sessions: 16      Goal 3: Patient will improve (B) hip abductor strength to 4/5 or greater to assist with ease of car transfers    Sessions: 16      Goal 4: Patient will improve TUG score to </= 15s to meet age matched norms and prevent risk of future falls.    Sessions: 16                                Kennith Maes, DPT

## 2019-04-12 NOTE — Progress Notes (Signed)
Name:Rick [name] Age: 86 y.o.   Date of Service: 04/12/2019  Referring Physician: Erasmo Score, MD   Date of Injury: 04/10/2019  Date Care Plan Established/Reviewed: 04/12/2019  Date Treatment Started: 04/12/2019  End of Certification Date: 07/10/2019  Sessions in Plan of Care: 16  Surgery Date: No data was found      Visit Count: 1   Diagnosis:   1. Acute bilateral low back pain without sciatica        Subjective     History of Present Illness   Mechanism of injury: Chronic with recurrence.   History of Present Illness: Patient was a previously patient for midline LBP reporting LBP is persisting with getting into and out of the car, into/out of bed, and with fwd bending/twisting. Patient also reports difficulty with stair-climbing with use of SPC going up with the right and down with the left.     Social Support/Occupation  Lives in: multiple level home  Lives with: adult children  Occupation: retired       Precautions: No data was found  Allergies: Patient has no known allergies.    No past medical history on file.    Objective     Posture     Lumbar Spine   Flattened and decreased lordosis.     Pelvis   Pelvis (Left): Elevated.     Hip   Hip (Left): Increased flexion.   Hip (Right): Increased flexion.     Knee   Knee (Left): Flexed.   Knee (Right): Flexed.     Functional Strength   Sit to Stand: UE assist  Step-up: 8 inches (Step-to with HHA leading with (L) up, (R) down )  Heel Raises      Bilateral: 3 with full heel raise/20 with HHA     Balance   Left   Eyes Open (Left)(sec)      Trial 1: 0 seconds  Right   Eyes Open (Right)(sec)      Trial 1: 0 seconds    Gait   Left Side: decreased stance phase altered trunk rotation with decreased arm swing decreased TKE decreased hip extension decreased toe off decreased heel strike  Right Side: altered trunk rotation with decreased arm swing decreased hip extension decreased toe off decreased heel strike  SPC on (R) with WBOS.     Integumentary   no wound, lesion or rash  noted    Flexibility Limited HS length on (L) compared to (R).     Tests   Lumbar/Pelvic Girdle/Sacrum   Positive pelvic asymmetry.    Lumbar/Pelvic Girdle/Sacrum (Left)   Negative passive SLR.    Lumbar/Pelvic Girdle/Sacrum (Right)   Negative: passive SLR    Left Hip   Positive FABER.     Right Hip   Positive FABER.     Neurological Testing     Sensation     Lumbar   Left   Intact: light touch    Right   Intact: light touch         AROM: Lumbar Spine   Initial  Sitting  Initial  Standing    Flexion To floor  To floor with slight knee flexion    Extension 10%  10%     R L R L   Rotation 100% 50% 100% 25%   Side Bending   In flexion to joint line In flexion   to joint line   (blank fields were intentionally left blank)      Initial  Sitting  R   R LE Strength  MMT /5 Initial  Sitting  L    L   4  Hip Flexion 4-    3+  Hip Extension 4-    3+  Hip Abduction 4-    4-  Hip Adduction 4-      Hip Internal Rot       Hip External Rot     4-  Quadriceps 3+    4  Hamstrings 4-    4   Ankle Dorsiflexion 4      Ankle Plantarflexion            (blank fields were intentionally left blank)    TUG with use of SPC on (R): w/ (L) turn 23'', (R) turn 32''     BP: 129/79 Heart Rate: 69    Treatment     Therapeutic Activity - Justified to address the following: activities to improve functional performance.  - Patient education on proper stair mechanics for safety   - Patient education on HEP, sets/reps, how to perform, and to D/C if pain elevates/persists     Home Exercises   Access Code: [email or web address]   URL: [email or web address]   Date: 04/12/2019   Prepared by: Rick [name]      Exercises Supine Lower Trunk Rotation - 10 reps - 3 sets - 3 hold - 2x daily - 7x weekly   Standing Lumbar Extension at Wall - Forearms - 10 reps - 3 sets - 10 hold - 2x daily - 7x weekly   Seated Hamstring Stretch - 3 reps - 3 sets - 30 hold - 2x daily - 7x weekly   Supine Bridge - 10 reps - 2 sets - 3 hold - 1x daily - 7x weekly   Heel rises with  counter support - 10 reps - 2 sets - 3 hold - 1x daily - 7x weekly   Seated Long Arc Quad - 10 reps - 2 sets - 3 hold - 1x daily - 7x weekly          ---      ---   Total Time   Timed Minutes  15 minutes   Untimed Minutes  30 minutes   Total Time  45 minutes         Assessment   Rick [name] is a 86 y.o. male presenting with c/o (R) sided LBP who requires Physical Therapy for the following:  Impairments:   IP[facility] Impairments: Pain that limits and interferes with functional ability  Impaired postural alignment  Decreased range of motion  Decreased strength  Decreased functional stability  Decreased joint mobility  Decreased soft tissue mobility  Decreased static balance  Decreased dynamic balance  Pain located: lumbar spine, (R) side   Clinical presentation: evolving - chronic with recurrence   Barriers to therapy: Patient's age - chronic postural deficits with repetitive tissue stress/strain and resulting weakness; normal age related changes   Time since onset of injury/illness/exacerbation - resulting in multi-joint involvement and increased biomechanical compensation over time    Prognosis: good  Plan   Visits per week: 2  Number of Sessions: 16  Direct One on One  62130: Therapeutic Exercise: To Develop Strength and Endurance, ROM and Flexibility  [id number]: Gait Training  86578: Neuromuscular Reeducation (Proprioceptive Neuromuscular Faciliation)  [phone]: Self Care/Home Mgmt Training (ADLs, safety procedures, use of assistive devices)  97140: Manual Therapy techniques (mobilization, manipulation, manual traction) (Grade I-V to lumbar spine, pelvic girdle, and regionally interdependent joints,  soft tissue mobilization, instrument assisted soft tissue mobilization.)  97530: Therapeutic Activities: Dynamic activities to improve functional performance  Supervised Modalities  97010: Thermal modalities: hot/cold packs    Goals    Goal 1: Patient will improve L/S rotation AROM to >/= 75% to improve performance of supine-to-sit and  sit-to-supine transfers.    Sessions: 8      Goal 2: Patient will improve (B) hip/knee extensor strength to 4/5 or greater to assist with ease of stair negotiation.    Sessions: 16      Goal 3: Patient will improve (B) hip abductor strength to 4/5 or greater to assist with ease of car transfers    Sessions: 16      Goal 4: Patient will improve TUG [name] to </= 15s to meet age matched norms and prevent risk of future falls.    Sessions: 16                                Kennith Maes, DPT

## 2019-04-14 ENCOUNTER — Telehealth (INDEPENDENT_AMBULATORY_CARE_PROVIDER_SITE_OTHER): Payer: Medicare Other | Admitting: Cardiovascular Disease

## 2019-04-14 ENCOUNTER — Encounter (INDEPENDENT_AMBULATORY_CARE_PROVIDER_SITE_OTHER): Payer: Self-pay | Admitting: Cardiovascular Disease

## 2019-04-14 DIAGNOSIS — I350 Nonrheumatic aortic (valve) stenosis: Secondary | ICD-10-CM

## 2019-04-14 NOTE — Progress Notes (Signed)
TELEMEDICINE VISIT              CLINICAL SUMMARY        Verbal Consent      Verbal consent has been obtained from the patient to conduct a telephone visit encounter to minimize exposure to COVID-19: yes      Chief Complaint:      Chief Complaint   Patient presents with    Results     Echo         Problem List, Medications, and Allergies reviewed: yes      HPI:  HPI  Mr. Rick [name] is a 86 year old gentleman status post bypass surgery 2006 who presents for cardiac evaluation.  He has severe aortic valve stenosis he need further evaluation for TAVR.       Assessment/Plan:    There are no diagnoses linked to this encounter.  Abnormal 2D echocardiogram-has reduced LV systolic function as compared to May 2020, severe aortic valve stenosis with a valve area of 0.67 and a 38 mm mean gradient.  No other significant valvular heart disease noted.  He is scheduled for a follow-up evaluation with Dr. Penny Pia on March 10 for consideration for TAVR.  I discussed this in detail with the patient's son and they will follow-up with with evaluation.  I advised him to limit his activities stay hydrated avoid sudden changes in positions and report any chest pain or syncope to our office    Time spent in medical discussion: 11 to 20 minutes    Fortino Sic, MD

## 2019-04-17 ENCOUNTER — Inpatient Hospital Stay
Payer: Medicare Other | Attending: Student in an Organized Health Care Education/Training Program | Admitting: Physical Therapist

## 2019-04-17 DIAGNOSIS — M545 Low back pain, unspecified: Secondary | ICD-10-CM

## 2019-04-17 NOTE — PT/OT Therapy Note (Signed)
Name: Rick [name] Age: 86 y.o.   Date of Service: 04/17/2019  Referring Physician: Erasmo Score, MD   Date of Injury: 04/10/2019  Date Care Plan Established/Reviewed: 04/12/2019  Date Treatment Started: 04/12/2019  End of Certification Date: 07/10/2019  Sessions in Plan of Care: 16  Surgery Date: No data was found    Visit Count: 2   Diagnosis:   1. Acute bilateral low back pain without sciatica        Subjective     Social Support/Occupation  Lives in: multiple level home  Lives with: adult children  Occupation: retired     Patient reports his back is "fair" today. Patient and son report most difficulty/pain with getting into/out of the car. Patient reports HEP is manageable to do.       Precautions: No data was found  Allergies: Patient has no known allergies.    Initial Evaluation Reference and/or Current Measurements(as dated):    Functional Strength   Sit to Stand: UE assist  Step-up: 8 inches (Step-to with HHA leading with (L) up, (R) down )  Heel Raises      Bilateral: 3 with full heel raise/20 with HHA     Balance   Left   Eyes Open (Left)(sec)      Trial 1: 0 seconds  Right   Eyes Open (Right)(sec)      Trial 1: 0 seconds    Flexibility Limited HS length on (L) compared to (R).     AROM: Lumbar Spine   Initial  Sitting  Initial  Standing    Flexion To floor  To floor with slight knee flexion    Extension 10%  10%     R L R L   Rotation 100% 50% 100% 25%   Side Bending   In flexion   to joint line In flexion   to joint line   (blank fields were intentionally left blank)      Initial   Sitting  R   R LE Strength  MMT /5 Initial  Sitting  L    L   4  Hip Flexion 4-    3+  Hip Extension 4-    3+  Hip Abduction 4-    4-  Hip Adduction 4-      Hip Internal Rot       Hip External Rot     4-  Quadriceps 3+    4  Hamstrings 4-    4   Ankle Dorsiflexion 4      Ankle Plantarflexion            (blank fields were intentionally left blank)    TUG with use of SPC on (R): w/ (L)  turn 23'', (R) turn 32''                   Treatment     Therapeutic Exercises - Justified to address any of the following: develop strength, endurance, ROM and/or flexibility.   - NuStep x5' with subjective taken to determine course of today's treatment   - PROM/AAROM (B) hip abduction and knee flexion with slide board and verbal cueing to prevent LE ER 2x10   - AROM LTRs and (B) hip IR with HOB elevated 2x10 with tactile cueing to improve end range performance   - Seated AROM (B) hip ER heel to shin and hip IR with ball squeeze to prevent compensations 2x10     Neuromuscular Re-Education - Justified to address any  of the following: of movement, balance, coordination, kinesthetic sense, posture and/or proprioception for sitting and/or standing activities.   - Seated marching with verbal/tactile cueing to improve TrA engagement and prevent compensatory truncal rotation 3x10   - Standing toe taps with short hurdles and verbal/tactile cueing to improve truncal extension and hip flexion on (R) 3x10 with unilateral HHA on (L)  - Short hurdle step-overs (B) 2x10 with verbal/tactile cueing to improve truncal extension and hip abduction versus ER     Manual Therapy - Justified to address any of the following:  Mobilization of joints and soft tissues, manipulation, manual lymphatic drainage, and/or manual traction.    Seated in L/S flexion   - DTM/MFR (B) L/S paraspinals   - Gd II/III (R) L4/L5 facet mobs     Home Exercises   Access Code: [email or web address]   URL: [email or web address]   Date: 04/12/2019   Prepared by: Laurell Roof      Exercises Supine Lower Trunk Rotation - 10 reps - 3 sets - 3 hold - 2x daily - 7x weekly   Standing Lumbar Extension at Wall - Forearms - 10 reps - 3 sets - 10 hold - 2x daily - 7x weekly   Seated Hamstring Stretch - 3 reps - 3 sets - 30 hold - 2x daily - 7x weekly   Supine Bridge - 10 reps - 2 sets - 3 hold - 1x daily - 7x weekly   Heel rises with counter support - 10 reps - 2 sets - 3  hold - 1x daily - 7x weekly   Seated Long Arc Quad - 10 reps - 2 sets - 3 hold - 1x daily - 7x weekly        ---      ---   Total Time   Timed Minutes  55 minutes   Total Time  55 minutes        Assessment   Patient with increased TTP/tightness (B) L/S paraspinals with point TTP at (R) L4/L5 facet reproducing patient's familiar pain. Patient most challenged by (L) hip ER and (R) hip IR/ER ROM without lumbar rotation and extension compensations. Patient requiring max cueing to improve truncal extension versus flexion and to maintain TrA engagement during performance of all standing hip strengthening exercises. PT observed patient getting into car at end of session noting increased L/S flexion and limited hip flexion/abduction. Patient and son educated on proper mechanics for entering/exiting the car.   Plan   Progress functional core and hip strengthening to improve performance of car transfers/gait. Add truncal rotation mobility exercises.       Goals    Goal 1: Patient will improve L/S rotation AROM to >/= 75% to improve performance of supine-to-sit and sit-to-supine transfers.    Sessions: 8      Goal 2: Patient will improve (B) hip/knee extensor strength to 4/5 or greater to assist with ease of stair negotiation.    Sessions: 16      Goal 3: Patient will improve (B) hip abductor strength to 4/5 or greater to assist with ease of car transfers    Sessions: 16      Goal 4: Patient will improve TUG score to </= 15s to meet age matched norms and prevent risk of future falls.    Sessions: [address]                                Kennith Maes,  DPT

## 2019-04-19 ENCOUNTER — Inpatient Hospital Stay: Payer: Medicare Other

## 2019-04-19 DIAGNOSIS — M545 Low back pain, unspecified: Secondary | ICD-10-CM

## 2019-04-19 NOTE — PT/OT Therapy Note (Signed)
Name: Rick [name] Age: 86 y.o.   Date of Service: 04/19/2019  Referring Physician: Erasmo Score, MD   Date of Injury: 04/10/2019  Date Care Plan Established/Reviewed: 04/12/2019  Date Treatment Started: 04/12/2019  End of Certification Date: 07/10/2019  Sessions in Plan of Care: 16  Surgery Date: No data was found    Visit Count: 3   Diagnosis:   1. Acute bilateral low back pain without sciatica        Subjective     Social Support/Occupation  Lives in: multiple level home  Lives with: adult children  Occupation: retired     Patient reports no back pain today. He says he good in/out of the car without issue today. Patient and son report that he has been performing his HEP 1x daily and just realized he is supposed to do it 2 x daily.       Precautions: No data was found  Allergies: Patient has no known allergies.    Initial Evaluation Reference and/or Current Measurements(as dated):    Functional Strength   Sit to Stand: UE assist  Step-up: 8 inches (Step-to with HHA leading with (L) up, (R) down )  Heel Raises      Bilateral: 3 with full heel raise/20 with HHA     Balance   Left   Eyes Open (Left)(sec)      Trial 1: 0 seconds  Right   Eyes Open (Right)(sec)      Trial 1: 0 seconds    Flexibility Limited HS length on (L) compared to (R).     AROM: Lumbar Spine   Initial  Sitting  Initial  Standing    Flexion To floor  To floor with slight knee flexion    Extension 10%  10%     R L R L   Rotation 100% 50% 100% 25%   Side Bending   In flexion   to joint line In flexion   to joint line   (blank fields were intentionally left blank)      Initial   Sitting  R   R LE Strength  MMT /5 Initial  Sitting  L    L   4  Hip Flexion 4-    3+  Hip Extension 4-    3+  Hip Abduction 4-    4-  Hip Adduction 4-      Hip Internal Rot       Hip External Rot     4-  Quadriceps 3+    4  Hamstrings 4-    4   Ankle Dorsiflexion 4      Ankle Plantarflexion            (blank fields were intentionally  left blank)    TUG with use of SPC on (R): w/ (L) turn 23'', (R) turn 32''                         Treatment     Therapeutic Exercises - Justified to address any of the following: develop strength, endurance, ROM and/or flexibility.   - NuStep x7' with subjective taken to determine course of today's treatment   - Standing pulley rotation x10  - Seated pulley marching x10   - Standing wall slides 2x5x5" VC for technique  - Seated trunk rotation x10  - AROM LTRs and (B) hip IR with HOB elevated 2x10 with tactile cueing to improve end range performance   -  STS x6        Neuromuscular Re-Education - Justified to address any of the following: of movement, balance, coordination, kinesthetic sense, posture and/or proprioception for sitting and/or standing activities.   - Seated RTB rows 2x10 VC for posture  - Standing GTB lat pulldown 2x10  - Bridge 2x10, hold  - Supine LE extension x20 VC for TrA engagement and LE alignment   - Sup<sit R and L with VC for sequencing and technique (rolling) to reduce lumbar shearing    Home Exercises   Access Code: [email or web address]   URL: [email or web address]   Date: 04/12/2019   Prepared by: Laurell Roof      Exercises Supine Lower Trunk Rotation - 10 reps - 3 sets - 3 hold - 2x daily - 7x weekly   Standing Lumbar Extension at Wall - Forearms - 10 reps - 3 sets - 10 hold - 2x daily - 7x weekly   Seated Hamstring Stretch - 3 reps - 3 sets - 30 hold - 2x daily - 7x weekly   Supine Bridge - 10 reps - 2 sets - 3 hold - 1x daily - 7x weekly   Heel rises with counter support - 10 reps - 2 sets - 3 hold - 1x daily - 7x weekly   Seated Long Arc Quad - 10 reps - 2 sets - 3 hold - 1x daily - 7x weekly        ---      ---   Total Time   Timed Minutes  53 minutes   Total Time  53 minutes        Assessment   Patient responded well to addition of truncal rotation, extension, and core strengthening exercises. Patient continues to require max cueing to improve postural extension versus  flexion during standing exercises and to maintain TrA engagement during supine exercises. Instruction on sequencing and technique for decreasing lumbar shearing with sup<sit transfers to decrease pain with getting in/out of bed.   Plan   Progress functional core and hip (flexion/abd) strengthening and truncal rotation mobility to improve performance of car transfers.      Goals    Goal 1: Patient will improve L/S rotation AROM to >/= 75% to improve performance of supine-to-sit and sit-to-supine transfers.    Sessions: 8      Goal 2: Patient will improve (B) hip/knee extensor strength to 4/5 or greater to assist with ease of stair negotiation.    Sessions: 16      Goal 3: Patient will improve (B) hip abductor strength to 4/5 or greater to assist with ease of car transfers    Sessions: 16      Goal 4: Patient will improve TUG score to </= 15s to meet age matched norms and prevent risk of future falls.    Sessions: [address], LPTA

## 2019-04-20 ENCOUNTER — Other Ambulatory Visit (INDEPENDENT_AMBULATORY_CARE_PROVIDER_SITE_OTHER): Payer: Self-pay | Admitting: Student in an Organized Health Care Education/Training Program

## 2019-04-24 ENCOUNTER — Inpatient Hospital Stay: Payer: Medicare Other | Admitting: Physical Therapist

## 2019-04-24 DIAGNOSIS — M545 Low back pain, unspecified: Secondary | ICD-10-CM

## 2019-04-24 NOTE — PT/OT Therapy Note (Signed)
Name: Rick [name] Age: 86 y.o.   Date of Service: 04/24/2019  Referring Physician: Erasmo Score, MD   Date of Injury: 04/10/2019  Date Care Plan Established/Reviewed: 04/12/2019  Date Treatment Started: 04/12/2019  End of Certification Date: 07/10/2019  Sessions in Plan of Care: 16  Surgery Date: No data was found    Visit Count: 4   Diagnosis:   1. Acute bilateral low back pain without sciatica        Subjective     Social Support/Occupation  Lives in: multiple level home  Lives with: adult children  Occupation: retired     Patient reports he went to [facility] over the weekend and was sitting in the car for x5 hours which caused some back pain and discomfort.       Precautions: No data was found  Allergies: Patient has no known allergies.    Initial Evaluation Reference and/or Current Measurements(as dated):    Functional Strength   Sit to Stand: UE assist  Step-up: 8 inches (Step-to with HHA leading with (L) up, (R) down )  Heel Raises      Bilateral: 3 with full heel raise/20 with HHA     Balance   Left   Eyes Open (Left)(sec)      Trial 1: 0 seconds  Right   Eyes Open (Right)(sec)      Trial 1: 0 seconds    Flexibility Limited HS length on (L) compared to (R).     AROM: Lumbar Spine   Initial  Sitting  Initial  Standing    Flexion To floor  To floor with slight knee flexion    Extension 10%  10%     R L R L   Rotation 100% 50% 100% 25%   Side Bending   In flexion   to joint line In flexion   to joint line   (blank fields were intentionally left blank)      Initial   Sitting  R   R LE Strength  MMT /5 Initial  Sitting  L    L   4  Hip Flexion 4-    3+  Hip Extension 4-    3+  Hip Abduction 4-    4-  Hip Adduction 4-      Hip Internal Rot       Hip External Rot     4-  Quadriceps 3+    4  Hamstrings 4-    4   Ankle Dorsiflexion 4      Ankle Plantarflexion            (blank fields were intentionally left blank)    TUG with use of SPC on (R): w/ (L) turn 23'', (R) turn 32''                          Treatment     Therapeutic Exercises - Justified to address any of the following: develop strength, endurance, ROM and/or flexibility.   - NuStep x6' with subjective taken to determine course of today's treatment   - Standing pulleys rotation x20 with verbal/tactile cueing to improve (L) hip ER   - Standing pulleys flexion x20 with verbal cueing to improve truncal elongation on (L)         Neuromuscular Re-Education - Justified to address any of the following: of movement, balance, coordination, kinesthetic sense, posture and/or proprioception for sitting and/or standing activities.   - Alternating split stance  rows and lat pull-downs RTB x10 each with verbal/tactile cueing to improve truncal extension   - Split stance ball rotations 6# 2x10 with verbal cueing to prevent lateral lean on (L)   - Split stance ball anti-rotation press 6# 2x10   - Supine marching to LE extension with verbal cueing to improve TKE 2x10     Manual Therapy - Justified to address any of the following:  Mobilization of joints and soft tissues, manipulation, manual lymphatic drainage, and/or manual traction.    Supine with 2 pillows  - Contract-relax (B) HS x3   - LTRs with OP and gentle oscillations at end range     Home Exercises   Access Code: [email or web address]   URL: [email or web address]   Date: 04/12/2019   Prepared by: Laurell Roof      Exercises Supine Lower Trunk Rotation - 10 reps - 3 sets - 3 hold - 2x daily - 7x weekly   Standing Lumbar Extension at Wall - Forearms - 10 reps - 3 sets - 10 hold - 2x daily - 7x weekly   Seated Hamstring Stretch - 3 reps - 3 sets - 30 hold - 2x daily - 7x weekly   Supine Bridge - 10 reps - 2 sets - 3 hold - 1x daily - 7x weekly   Heel rises with counter support - 10 reps - 2 sets - 3 hold - 1x daily - 7x weekly   Seated Long Arc Quad - 10 reps - 2 sets - 3 hold - 1x daily - 7x weekly        ---      ---   Total Time   Timed Minutes  43 minutes   Total Time  43 minutes         Assessment   Patient with limited OH flexion, truncal/hip rotation, and knee extension (L) > (R) addressed via manuals and added exercise prescription focusing on extensor and core stabilization and endurance. Patient with no report of LBP or adverse reaction to today's treatment this date.   Plan   STS with lat engagement and alternating hip/arm extension at wall.       Goals    Goal 1: Patient will improve L/S rotation AROM to >/= 75% to improve performance of supine-to-sit and sit-to-supine transfers.    Sessions: 8      Goal 2: Patient will improve (B) hip/knee extensor strength to 4/5 or greater to assist with ease of stair negotiation.    Sessions: 16      Goal 3: Patient will improve (B) hip abductor strength to 4/5 or greater to assist with ease of car transfers    Sessions: 16      Goal 4: Patient will improve TUG score to </= 15s to meet age matched norms and prevent risk of future falls.    Sessions: 16                                Kennith Maes, DPT

## 2019-04-26 ENCOUNTER — Ambulatory Visit (INDEPENDENT_AMBULATORY_CARE_PROVIDER_SITE_OTHER): Payer: Medicare Other | Admitting: Internal Medicine

## 2019-04-26 ENCOUNTER — Encounter (INDEPENDENT_AMBULATORY_CARE_PROVIDER_SITE_OTHER): Payer: Self-pay

## 2019-04-26 ENCOUNTER — Encounter (INDEPENDENT_AMBULATORY_CARE_PROVIDER_SITE_OTHER): Payer: Self-pay | Admitting: Internal Medicine

## 2019-04-26 VITALS — BP 136/62 | HR 61 | Temp 99.0°F | Ht 67.0 in | Wt 153.6 lb

## 2019-04-26 DIAGNOSIS — G3184 Mild cognitive impairment, so stated: Secondary | ICD-10-CM

## 2019-04-26 DIAGNOSIS — I350 Nonrheumatic aortic (valve) stenosis: Secondary | ICD-10-CM

## 2019-04-28 ENCOUNTER — Inpatient Hospital Stay: Payer: Medicare Other | Admitting: Physical Therapist

## 2019-04-28 DIAGNOSIS — M545 Low back pain, unspecified: Secondary | ICD-10-CM

## 2019-04-28 NOTE — PT/OT Therapy Note (Deleted)
Name: Rick [name] Age: 86 y.o.   Date of Service: 04/28/2019  Referring Physician: Erasmo Score, MD   Date of Injury: 04/10/2019  Date Care Plan Established/Reviewed: 04/12/2019  Date Treatment Started: 04/12/2019  End of Certification Date: 07/10/2019  Sessions in Plan of Care: 16  Surgery Date: No data was found    Visit Count: 5   Diagnosis:   1. Acute bilateral low back pain without sciatica        Subjective     Social Support/Occupation  Lives in: multiple level home  Lives with: adult children  Occupation: retired     Patient reports he went to [facility] over the weekend and was sitting in the car for x5 hours which caused some back pain and discomfort.       Precautions: No data was found  Allergies: Patient has no known allergies.    Initial Evaluation Reference and/or Current Measurements(as dated):    Functional Strength   Sit to Stand: UE assist  Step-up: 8 inches (Step-to with HHA leading with (L) up, (R) down )  Heel Raises      Bilateral: 3 with full heel raise/20 with HHA     Balance   Left   Eyes Open (Left)(sec)      Trial 1: 0 seconds  Right   Eyes Open (Right)(sec)      Trial 1: 0 seconds    Flexibility Limited HS length on (L) compared to (R).     AROM: Lumbar Spine   Initial  Sitting  Initial  Standing    Flexion To floor  To floor with slight knee flexion    Extension 10%  10%     R L R L   Rotation 100% 50% 100% 25%   Side Bending   In flexion   to joint line In flexion   to joint line   (blank fields were intentionally left blank)      Initial   Sitting  R   R LE Strength  MMT /5 Initial  Sitting  L    L   4  Hip Flexion 4-    3+  Hip Extension 4-    3+  Hip Abduction 4-    4-  Hip Adduction 4-      Hip Internal Rot       Hip External Rot     4-  Quadriceps 3+    4  Hamstrings 4-    4   Ankle Dorsiflexion 4      Ankle Plantarflexion            (blank fields were intentionally left blank)    TUG with use of SPC on (R): w/ (L) turn 23'', (R) turn 32''                          Treatment     Therapeutic Exercises - Justified to address any of the following: develop strength, endurance, ROM and/or flexibility.   - NuStep x6' with subjective taken to determine course of today's treatment   - Standing pulleys rotation x20 with verbal/tactile cueing to improve (L) hip ER   - Standing pulleys flexion x20 with verbal cueing to improve truncal elongation on (L)         Neuromuscular Re-Education - Justified to address any of the following: of movement, balance, coordination, kinesthetic sense, posture and/or proprioception for sitting and/or standing activities.   - Alternating split stance  rows and lat pull-downs RTB x10 each with verbal/tactile cueing to improve truncal extension   - Split stance ball rotations 6# 2x10 with verbal cueing to prevent lateral lean on (L)   - Split stance ball anti-rotation press 6# 2x10   - Supine marching to LE extension with verbal cueing to improve TKE 2x10     Manual Therapy - Justified to address any of the following:  Mobilization of joints and soft tissues, manipulation, manual lymphatic drainage, and/or manual traction.    Supine with 2 pillows  - Contract-relax (B) HS x3   - LTRs with OP and gentle oscillations at end range     Home Exercises   Access Code: [email or web address]   URL: [email or web address]   Date: 04/12/2019   Prepared by: Laurell Roof      Exercises Supine Lower Trunk Rotation - 10 reps - 3 sets - 3 hold - 2x daily - 7x weekly   Standing Lumbar Extension at Wall - Forearms - 10 reps - 3 sets - 10 hold - 2x daily - 7x weekly   Seated Hamstring Stretch - 3 reps - 3 sets - 30 hold - 2x daily - 7x weekly   Supine Bridge - 10 reps - 2 sets - 3 hold - 1x daily - 7x weekly   Heel rises with counter support - 10 reps - 2 sets - 3 hold - 1x daily - 7x weekly   Seated Long Arc Quad - 10 reps - 2 sets - 3 hold - 1x daily - 7x weekly          Assessment   Patient with limited OH flexion, truncal/hip rotation, and knee  extension (L) > (R) addressed via manuals and added exercise prescription focusing on extensor and core stabilization and endurance. Patient with no report of LBP or adverse reaction to today's treatment this date.   Plan   STS with lat engagement and alternating hip/arm extension at wall.       Goals    Goal 1: Patient will improve L/S rotation AROM to >/= 75% to improve performance of supine-to-sit and sit-to-supine transfers.    Sessions: 8      Goal 2: Patient will improve (B) hip/knee extensor strength to 4/5 or greater to assist with ease of stair negotiation.    Sessions: 16      Goal 3: Patient will improve (B) hip abductor strength to 4/5 or greater to assist with ease of car transfers    Sessions: 16      Goal 4: Patient will improve TUG score to </= 15s to meet age matched norms and prevent risk of future falls.    Sessions: 16                                Kennith Maes, DPT

## 2019-04-28 NOTE — PT/OT Therapy Note (Signed)
Name: Rick [name] Age: 86 y.o.   Date of Service: 04/28/2019  Referring Physician: Erasmo Score, MD   Date of Injury: 04/10/2019  Date Care Plan Established/Reviewed: 04/12/2019  Date Treatment Started: 04/12/2019  End of Certification Date: 07/10/2019  Sessions in Plan of Care: 16  Surgery Date: No data was found    Visit Count: 5   Diagnosis:   1. Acute bilateral low back pain without sciatica        Subjective     Social Support/Occupation  Lives in: multiple level home  Lives with: adult children  Occupation: retired     Patient reports he has been getting into/out of the car more carefully, but has not had any back pain with twisting movements. Patient reports going up/down stairs is about the same as he does not have many in his home. Son reported he had one episode of back pain with putting on his jacket rotating/putting the (L) arm in.       Precautions: No data was found  Allergies: Patient has no known allergies.    Initial Evaluation Reference and/or Current Measurements(as dated):    Functional Strength   Sit to Stand: UE assist  Step-up: 8 inches (Step-to with HHA leading with (L) up, (R) down )  Heel Raises      Bilateral: 3 with full heel raise/20 with HHA     Balance   Left   Eyes Open (Left)(sec)      Trial 1: 0 seconds  Right   Eyes Open (Right)(sec)      Trial 1: 0 seconds    Flexibility Limited HS length on (L) compared to (R).     AROM: Lumbar Spine   Initial  Sitting  Initial  Standing  04/28/19   Flexion To floor  To floor with slight knee flexion     Extension 10%  10%      R L R L L   Rotation 100% 50% 100% 25% 50%   Side Bending   In flexion   to joint line In flexion   to joint line    (blank fields were intentionally left blank)      Initial   Sitting  R   R LE Strength  MMT /5 Initial  Sitting  L    L   4  Hip Flexion 4-    3+  Hip Extension 4-    3+  Hip Abduction 4-    4-  Hip Adduction 4-      Hip Internal Rot       Hip External Rot     4-  Quadriceps 3+     4  Hamstrings 4-    4   Ankle Dorsiflexion 4      Ankle Plantarflexion            (blank fields were intentionally left blank)    TUG with use of SPC on (R): w/ (L) turn 23'', (R) turn 32''                         Treatment     Therapeutic Exercises - Justified to address any of the following: develop strength, endurance, ROM and/or flexibility.   - NuStep x6' with subjective taken to determine course of today's treatment   - STS transfers with use of RTB to improve lat engagement and truncal extension and tactile cueing to improve knee extension 3x5   - Ball wall  walks flexion to improve truncal extensor endurance 2x10   - Standing 3-way stretch at plinth 2x30'' each   - Reassessment of (L) L/S rotational AROM to determine progress towards goals         Gait Training - Justified to address any of the following:  Ambulation including weight bearing variation, gait sequencing, instruction/progression of an assistive device, and/or stair climbing.    - Step-ups 6'' with unilateral HHA and verbal/tactile cueing to improve hip extension, knee extension at end range, and truncal extension via looking up x15 (B)   - Step-downs with unilateral HHA 3'' and 6'' x10 (B) with verbal cueing to improve TKE, truncal extension, and glute max engagement during step-back     Home Exercises   Access Code: [email or web address]   URL: [email or web address]   Date: 04/12/2019   Prepared by: Laurell Roof      Exercises Supine Lower Trunk Rotation - 10 reps - 3 sets - 3 hold - 2x daily - 7x weekly   Standing Lumbar Extension at Wall - Forearms - 10 reps - 3 sets - 10 hold - 2x daily - 7x weekly   Seated Hamstring Stretch - 3 reps - 3 sets - 30 hold - 2x daily - 7x weekly   Supine Bridge - 10 reps - 2 sets - 3 hold - 1x daily - 7x weekly   Heel rises with counter support - 10 reps - 2 sets - 3 hold - 1x daily - 7x weekly   Seated Long Arc Quad - 10 reps - 2 sets - 3 hold - 1x daily - 7x weekly        ---      ---    Total Time   Timed Minutes  38 minutes   Total Time  38 minutes        Assessment   Patient with improved, but limited truncal rotation (L) with pain reported with putting on jacket this date. Patient with continual difficulties with TKE with step-up and allowing quadriceps to elongate during step-down indicating need to further address quadriceps mm length. Patient with improving endurance of truncal extensors with no report of fatigue with ball wall walks, though flexion of hip and knees continuing to be noted with prolonged exercise in standing.   Plan   Quadriceps stretching at chair and bent over hip extension. Address limited (L) hip ER mobility and (L) L4/L5 facet mobility.       Goals    Goal 1: Patient will improve L/S rotation AROM to >/= 75% to improve performance of supine-to-sit and sit-to-supine transfers.     - Goal progressing 04/28/19: 50% in standing with limited pelvic rotation noted  -JL   Sessions: 8      Goal 2: Patient will improve (B) hip/knee extensor strength to 4/5 or greater to assist with ease of stair negotiation.    Sessions: 16      Goal 3: Patient will improve (B) hip abductor strength to 4/5 or greater to assist with ease of car transfers    Sessions: 16      Goal 4: Patient will improve TUG score to </= 15s to meet age matched norms and prevent risk of future falls.    Sessions: 16                                Kennith Maes, DPT

## 2019-05-01 ENCOUNTER — Other Ambulatory Visit (INDEPENDENT_AMBULATORY_CARE_PROVIDER_SITE_OTHER): Payer: Self-pay

## 2019-05-01 ENCOUNTER — Inpatient Hospital Stay: Payer: Medicare Other | Admitting: Physical Therapist

## 2019-05-01 DIAGNOSIS — Z01818 Encounter for other preprocedural examination: Secondary | ICD-10-CM

## 2019-05-01 NOTE — Progress Notes (Signed)
ERROR! Not vascular!

## 2019-05-04 ENCOUNTER — Telehealth (INDEPENDENT_AMBULATORY_CARE_PROVIDER_SITE_OTHER): Payer: Self-pay | Admitting: Internal Medicine

## 2019-05-04 NOTE — Telephone Encounter (Signed)
-----   Message from Regina Eck, RN sent at 05/03/2019  2:40 PM EDT -----  Regarding: RE: L and RHC  Sorry for the late response!    He just f/u with me. Dx is Severe Aortic Stenosis.    Thanks,  Erie Noe  ----- Message -----  From: Berneice Heinrich A  Sent: 05/03/2019  10:27 AM EDT  To: Regina Eck, RN  Subject: RE: L and RHC                                    Good Morning Erie Noe,    Have you heard anything about the Dx for L&RHC?    Mayra  ----- Message -----  From: Regina Eck, RN  Sent: 05/01/2019  11:23 AM EDT  To: Melene Plan  Subject: RE: L and RHC                                    Good Morning!    Labs are ordered now. I took a look at his med list and there's no need to hold any medications. Also texted Dr. Penny Pia about his dx so i'll f/u on your question.    Thanks,  Erie Noe  ----- Message -----  From: Berneice Heinrich A  Sent: 05/01/2019  10:41 AM EDT  To: Regina Eck, RN  Subject: L and RHC                                        Good Morning Erie Noe,    I got a pt message from Darel Hong this morning that this pt needs a [facility]. When you have a moment would you please place the covid and lab orders for this pt.     Also would yo be able to tell me what the Diagnosis would be? The office not is not available. Can I use Severe aortic stenosis from Dr. Vidal Schwalbe note?      Thank you  Mayra

## 2019-05-04 NOTE — Telephone Encounter (Signed)
Called pt and attempted to scheduled [facility] at [facility].     Pt stated that he would like 05/10/2019 but is concerned if it should be with Dr. Katrinka Blazing (Invasive Dr on 03/24). Should we do this with you since he is being considered for TAVR?    Pt also stated that he was referred to see two other providers before having this procedure.    Please advise  Mayra

## 2019-05-05 ENCOUNTER — Inpatient Hospital Stay: Payer: Medicare Other | Admitting: Physical Therapist

## 2019-05-05 DIAGNOSIS — M545 Low back pain, unspecified: Secondary | ICD-10-CM

## 2019-05-05 NOTE — PT/OT Therapy Note (Signed)
Name: Rick [name] Age: 86 y.o.   Date of Service: 05/05/2019  Referring Physician: Erasmo Score, MD   Date of Injury: 04/10/2019  Date Care Plan Established/Reviewed: 04/12/2019  Date Treatment Started: 04/12/2019  End of Certification Date: 07/10/2019  Sessions in Plan of Care: 16  Surgery Date: No data was found    Visit Count: 6   Diagnosis:   1. Acute bilateral low back pain without sciatica        Subjective     Social Support/Occupation  Lives in: multiple level home  Lives with: adult children  Occupation: retired     Patient reports "the back lets him know it's there when twisting". Patient reports less pain overall.       Precautions: No data was found  Allergies: Patient has no known allergies.    Initial Evaluation Reference and/or Current Measurements(as dated):    Functional Strength   Sit to Stand: UE assist  Step-up: 8 inches (Step-to with HHA leading with (L) up, (R) down )  Heel Raises      Bilateral: 3 with full heel raise/20 with HHA     Balance   Left   Eyes Open (Left)(sec)      Trial 1: 0 seconds  Right   Eyes Open (Right)(sec)      Trial 1: 0 seconds    Flexibility Limited HS length on (L) compared to (R).     AROM: Lumbar Spine   Initial  Sitting  Initial  Standing  04/28/19   Flexion To floor  To floor with slight knee flexion     Extension 10%  10%      R L R L L   Rotation 100% 50% 100% 25% 50%   Side Bending   In flexion   to joint line In flexion   to joint line    (blank fields were intentionally left blank)      Initial   Sitting  R   R LE Strength  MMT /5 Initial  Sitting  L    L   4  Hip Flexion 4-    3+  Hip Extension 4-    3+  Hip Abduction 4-    4-  Hip Adduction 4-      Hip Internal Rot       Hip External Rot     4-  Quadriceps 3+    4  Hamstrings 4-    4   Ankle Dorsiflexion 4      Ankle Plantarflexion            (blank fields were intentionally left blank)    TUG with use of SPC on (R): w/ (L) turn 23'', (R) turn 32''                          Treatment     Therapeutic Exercises - Justified to address any of the following: develop strength, endurance, ROM and/or flexibility.   - NuStep x6' with subjective taken to determine course of today's treatment   - PROM (L) hip flexion, abduction, and ER to end range with gentle oscillations x20 each   - DKTC and LTRs with PB and active assistance to improve end range and promote hip mm/capsular extensibility x20       Neuromuscular Re-Education - Justified to address any of the following: of movement, balance, coordination, kinesthetic sense, posture and/or proprioception for sitting and/or standing activities.   -  Seated toe raises with emphasis on controlled lowering 3'' 2x10   - Standing heel/toe raises with verbal/tactile cueing to decrease UE use and hip flexion respectively 2x10   - Bent over hip extension at plinth 2x10 with tactile cueing to prevent rotation on (L)   - Standing (B) hip abduction with tactile/verbal cueing to prevent truncal flexion/lateral lean on (L) 2x10     Manual Therapy - Justified to address any of the following:  Mobilization of joints and soft tissues, manipulation, manual lymphatic drainage, and/or manual traction.    Supine   - Contract-relax (B) HS   - Gd II/III (L) hip A/P and lateral mobs with belt   Seated   - Gd II/III (L) L4/L5 facet mobility     Therapeutic Activity - Justified to address the following: activities to improve functional performance.  - Patient and son education on updated HEP, sets/reps, and how to perform   - Patient and son education on rationale of interventions chosen and how to prevent compensations affecting c/o LBP     Home Exercises   Access Code: [email or web address]  URL: [email or web address]  Date: 05/05/2019  Prepared by: Laurell Roof    Exercises   Seated Hip External Rotation AROM - 2 x daily - 7 x weekly - 2 sets - 10 reps - 3 hold   Seated Hamstring Stretch - 2 x daily - 7 x weekly - 1 sets - 10 reps - 10 hold   Seated Toe  Raise - 2 x daily - 7 x weekly - 2 sets - 10 reps - 3 hold   Standing Hip Extension with Leg Bent and Support - 1 x daily - 7 x weekly - 2 sets - 10 reps - 3 hold   Standing Hip Abduction with Counter Support - 1 x daily - 7 x weekly - 2 sets - 10 reps - 3 hold   Heel rises with counter support - 1 x daily - 7 x weekly - 2 sets - 10 reps - 3 hold   Standing Ankle Dorsiflexion with Table Support - 1 x daily - 7 x weekly - 2 sets - 10 reps - 3 hold            ---      ---   Total Time   Timed Minutes  55 minutes   Total Time  55 minutes        Assessment   Patient with good response to manuals to improve available AROM/PROM of (L) hip rotation and abduction. Patient with marked difficulty with controlled lowering during toe raises both sitting/standing, achieving end range DF in standing w/o compensatory hip flexion, and with prevention of truncal flexion fwd/lateral during standing exercises due to decrease endurance of spinal extensors and limited glute med strength on (L). HEP updated and reviewed with patient and son.   Plan   Reassess hip/knee strength to determine progress towards goals and efficacy of updated HEP.       Goals    Goal 1: Patient will improve L/S rotation AROM to >/= 75% to improve performance of supine-to-sit and sit-to-supine transfers.     - Goal progressing 04/28/19: 50% in standing with limited pelvic rotation noted  -JL   Sessions: 8      Goal 2: Patient will improve (B) hip/knee extensor strength to 4/5 or greater to assist with ease of stair negotiation.    Sessions: 16      Goal 3: Patient will improve (B)  hip abductor strength to 4/5 or greater to assist with ease of car transfers    Sessions: 16      Goal 4: Patient will improve TUG score to </= 15s to meet age matched norms and prevent risk of future falls.    Sessions: 16                                Kennith Maes, DPT

## 2019-05-05 NOTE — Progress Notes (Signed)
[facility] OFFICE CONSULTATION      VALVE CLINIC     REASON FOR CONSULTATION: SEVERE SYMPTOMATIC AORTIC STENOSIS     Dear Dr. Glean Hess,    I had the pleasure of seeing Rick [name] today for cardiovascular evaluation, who is accompanied by his son. He is a pleasant 86 y.o. male with a history of severe symptomatic aortic valve stenosis who presents for cardiac evaluation of transcatheter aortic valve replacement.  As you may recall, his past medical history is significant for:     1.  Severe symptomatic aortic valve stenosis   - a mean gradient of 38 mmHg, aortic valve area of 0.67 cm, and dimensionless index of 0.15.  2.  Systolic and diastolic heart failure with estimated ejection fraction of 30%.  3.  Mild cognitive dysfunction.    4.  Hypertensive heart disease.  5.  Dyslipidemia.     His main complaints today is mild shortness of breath while he goes up the stairs or walk more than 1-2 blocks.  He remains independent and functional.  His cognitive impairment remains mild and he lives with his son.  His son does not help him with his activities of daily living, but some of his instrumental activities of daily living are supplemented by his son.  He has no angina and has no history of syncope.  He has no other signs of heart failure and remains compliant with his medications.    PAST MEDICAL HISTORY:   No past medical history on file.    MEDICATIONS:     Current Outpatient Medications   Medication Sig Dispense Refill    aspirin EC 81 MG EC tablet Take 81 mg by mouth daily      memantine (NAMENDA) 10 MG tablet Take 10 mg by mouth         lidocaine (Lidoderm) 5 % Place 1 patch onto the skin every 24 hours Remove & Discard patch within 12 hours or as directed by MD 30 each 0    metoprolol succinate XL (Toprol XL) 25 MG 24 hr tablet Take 1 tablet (25 mg total) by mouth daily 90 tablet 2    rosuvastatin (CRESTOR) 10 MG tablet Take 1 tablet (10 mg total) by mouth daily (Patient taking differently:  Take 10 mg by mouth daily   ) 90 tablet 3     No current facility-administered medications for this visit.     ALLERGIES: No Known Allergies    FAMILY HISTORY: His family history includes No known problems in his brother, father, mother, and sister.  No premature CAD.    SOCIAL HISTORY: He reports that he has never smoked. He has never used smokeless tobacco. He reports that he does not drink alcohol and does not use drugs.    REVIEW OF SYSTEMS: All other systems reviewed and negative except as above.     PHYSICAL EXAMINATION  Vital Signs: BP 136/62 (BP Site: Left arm, Patient Position: Sitting)    Pulse 61    Temp 99 F (37.2 C)    Ht 1.702 m (5\' 7" )    Wt 69.7 kg (153 lb 9.6 oz)    SpO2 96%    BMI 24.06 kg/m    Vital signs reviewed    Wt Readings from Last 3 Encounters:   04/26/19 69.7 kg (153 lb 9.6 oz)   04/14/19 68 kg (150 lb)   04/10/19 68.5 kg (151 lb)        General Appearance:  A well-appearing male in no acute distress.    HEENT: Sclera anicteric, conjunctiva without pallor, moist mucous membranes, normal dentition.   Neck:  Supple without jugular venous distention.  Normal carotid upstrokes without bruits.   Chest: Clear to auscultation bilaterally with good air movement and respiratory effort and no wheezes, rales, or rhonchi   Cardiac: RRR.  Normal S1 and physiologically split S2, without gallops or rub.  There is an ejection systolic murmur at the aortic area.  Vascular:  2+ carotid, radial pulses bilaterally  Abdomen: Soft, nontender, nondistended, with normoactive bowel sounds.  No bruits.   Extremities: Warm without edema, clubbing, or cyanosis.   Skin: No rash, warm, appropriate for race.   Neuro: Alert and oriented x3. Grossly intact.  CN II-XII intact.  Normal mood and affect.     ECG:    Independent review shows: Sinus rhythm with PVC, normal PR interval, incomplete left bundle branch block, and nonspecific T wave abnormalities.    ECHOCARDIOGRAM: Left ventricular ejection fraction is severely  decreased with an estimated  ejection fraction of 30%. Biatrial enlargement. There is severe aortic stenosis with a mean gradient of  38 mmHg, aortic  valve area of  0.67 cm2, and an NDSI of  0.15. Mild-moderate mitral and tricuspid regurgitation. Compared to the prior study dated 07/12/2018, left ventricular systolic function has decreased. The left ventricle is mildly dilated. Left ventricular wall thickness is normal. Left ventricular ejection fraction is severely decreased with an estimated  ejection fraction of 30%. Left ventricular diastolic filling parameters are indeterminate. Moderate-severe global hypokinesis with regional variation. Right Ventricle The right ventricular cavity size is normal in size. Normal right ventricular systolic function. Left Atrium. The left atrium is dilated. Right Atrium. The right atrium is dilated. Atrial Septum No evidence of interatrial shunt by color Doppler. Aortic Valve. The aortic valve is tricuspid and sclerotic. There is severe aortic stenosis with a mean gradient of 38 mmHg, aortic valve area of 0.67 cm2, and an NDSI of 0.15. There is mild aortic regurgitation. Pulmonary Valve The pulmonic valve is not well visualized There is trace to mild pulmonic regurgitation. Mitral Valve The mitral valve is thickened. There is mild to moderate mitral regurgitation. Tricuspid Valve The tricuspid valve is structurally normal. There is mild to moderate tricuspid regurgitation No pulmonary hypertension with estimated right ventricular systolic pressure  of 33 mmHg. Pericardium / Pleural Effusion No pericardial effusion visualized. Inferior Vena Cava The IVC is normal in size with < 50% respiratory variance consistent with elevated RA pressure of 8 mmHg. The aortic root is normal in size. The ascending aorta is normal in size.    ASSESSMENT/PLAN:    1. Transcatheter aortic valve replacement.  I have reviewed the patient's echocardiogram and the aortic valve leaflets appears to be  calcified and thickened with restricted valve motion.  The aortic valve area according to the continue with the equation is estimated at 0.67 cm, dimensionless index of 0.15, and a mean gradient of 38 mmHg.  Combined with his symptoms, the patient qualifies for transcatheter aortic valve replacement.  I have discussed the risks and benefits of the procedure with him and his son at length.  They both wants to proceed with the process.  He is classified as New York Heart Association class II.  I have particularly discussed with him the risk of needing a permanent pacemaker given his conduction disease on surface ECG.  I have also discussed with him the geriatric risks and the need  for geriatric assessment prior to transcatheter aortic valve replacement given his age and cognitive impairment.  2. Plan for left and right heart catheterization.  3. Plan for geriatric assessment by our geriatrics team.  4. Referral for valve work-up at [facility].  5. We will continue to follow.    All patient's questions and concerns regarding cardiovascular disease were answered during this visit.    No orders of the defined types were placed in this encounter.    No follow-ups on file.    Arlyss Queen, MD PhD  05/05/2019

## 2019-05-08 ENCOUNTER — Inpatient Hospital Stay: Payer: Medicare Other | Admitting: Physical Therapist

## 2019-05-08 ENCOUNTER — Encounter (INDEPENDENT_AMBULATORY_CARE_PROVIDER_SITE_OTHER): Payer: Self-pay | Admitting: Student in an Organized Health Care Education/Training Program

## 2019-05-08 DIAGNOSIS — M545 Low back pain, unspecified: Secondary | ICD-10-CM

## 2019-05-09 NOTE — PT/OT Therapy Note (Signed)
Name: Rick [name] Age: 86 y.o.   Date of Service: 05/08/2019  Referring Physician: Erasmo Score, MD   Date of Injury: 04/10/2019  Date Care Plan Established/Reviewed: 04/12/2019  Date Treatment Started: 04/12/2019  End of Certification Date: 07/10/2019  Sessions in Plan of Care: 16  Surgery Date: No data was found    Visit Count: 7   Diagnosis:   1. Acute bilateral low back pain without sciatica        Subjective     History of Present Illness   Functional Limitations (PLOF): Patient reports mild difficulty with twisting movements such as putting on jacket and getting into/out of the car due to c/o (L) sided back pain (previously moderate to severe difficulty).   Patient reports difficulty with ambulation and stair-climbing due to poor balance requiring use of SPC and HHA (previously level of difficulty > several months).     Social Support/Occupation  Lives in: multiple level home  Lives with: adult children  Occupation: retired       Precautions: No data was found  Allergies: Patient has no known allergies.    Initial Evaluation Reference and/or Current Measurements(as dated):    Functional Strength   Sit to Stand: UE assist  Step-up: 8 inches (Step-to with HHA leading with (L) up, (R) down )  Heel Raises      Bilateral: 3 with full heel raise/20 with HHA   (05/08/19) 20/20 with HHA     Balance   Left   Eyes Open (Left)(sec)      Trial 1: 0 seconds  Right   Eyes Open (Right)(sec)      Trial 1: 0 seconds  (05/08/19) 2-3'' (B) with finger assist     Flexibility Limited HS length on (L) compared to (R).     AROM: Lumbar Spine   Initial  Sitting  Initial  Standing  04/28/19   Flexion To floor  To floor with slight knee flexion     Extension 10%  10%      R L R L L   Rotation 100% 50% 100% 25% 50%   Side Bending   In flexion   to joint line In flexion   to joint line    (blank fields were intentionally left blank)      Initial   Sitting  R 05/08/19  R LE Strength  MMT /5 Initial  Sitting  L 05/08/19   L    4 4 Hip Flexion 4- 4   3+  4+  Standing Hip Extension 4-  4+  Standing   3+ 4+ Hip Abduction 4- 4with hip hiking noted   4-  Hip Adduction 4-      Hip Internal Rot       Hip External Rot     4- 4+ Quadriceps 3+ 4+   4 4+ Hamstrings 4- 4   4   Ankle Dorsiflexion 4      Ankle Plantarflexion            (blank fields were intentionally left blank)    TUG with use of SPC on (R): w/ (L) turn 23'', (R) turn 32''  (05/08/19) 15''  (B) with increased instability noted with (L) turn                         Treatment     Therapeutic Exercises - Justified to address any of the following: develop strength, endurance, ROM and/or flexibility.   -  NuStep x6' with subjective taken to determine course of today's treatment   - PROM (L) hip flexion, abduction, and ER to end range with gentle oscillations x20 each   - DKTC and LTRs with PB and active assistance to improve end range and promote hip mm/capsular extensibility x20   - Reassessment of LE strength, SLS, and TUG to determine progress towards goals       Neuromuscular Re-Education - Justified to address any of the following: of movement, balance, coordination, kinesthetic sense, posture and/or proprioception for sitting and/or standing activities.   - Standing toe taps to step without HHA with verbal/tactile cueing to improve glute engagement to improve dynamic SLS 2x10   - Standing heel/toe raises with verbal/tactile cueing to decrease UE use and hip flexion respectively 2x10   - Bent over hip extension at plinth 2x10 with tactile cueing to prevent rotation on (L)   - Standing (B) hip abduction with tactile/verbal cueing to prevent truncal flexion/lateral lean on (L) 2x10     Manual Therapy - Justified to address any of the following:  Mobilization of joints and soft tissues, manipulation, manual lymphatic drainage, and/or manual traction.    Supine   - Gd II/III (L) hip A/P and lateral mobs with belt       Home Exercises   Access Code:  [email or web address]  URL: [email or web address]  Date: 05/05/2019  Prepared by: Laurell Roof    Exercises   Seated Hip External Rotation AROM - 2 x daily - 7 x weekly - 2 sets - 10 reps - 3 hold   Seated Hamstring Stretch - 2 x daily - 7 x weekly - 1 sets - 10 reps - 10 hold   Seated Toe Raise - 2 x daily - 7 x weekly - 2 sets - 10 reps - 3 hold   Standing Hip Extension with Leg Bent and Support - 1 x daily - 7 x weekly - 2 sets - 10 reps - 3 hold   Standing Hip Abduction with Counter Support - 1 x daily - 7 x weekly - 2 sets - 10 reps - 3 hold   Heel rises with counter support - 1 x daily - 7 x weekly - 2 sets - 10 reps - 3 hold   Standing Ankle Dorsiflexion with Table Support - 1 x daily - 7 x weekly - 2 sets - 10 reps - 3 hold            ---      ---   Total Time   Timed Minutes  43 minutes   Total Time  43 minutes        Assessment   Patient making good progress towards goals, with most limitations persisting in endurance of spinal extensors and (L) hip abduction strength continuing to affect ambulation tolerance and performance of functional transfers/transitions. Patient transitioned to hip strengthening and dynamic stabilization exercises including toe taps without HHA requiring max cueing to improve truncal extension, hip flexion, ankle DF, and glute engagement to prevent lateral truncal lean. Patient and son agreeable to D/C at last scheduled session.   Functional Limitations (PLOF): Patient reports mild difficulty with twisting movements such as putting on jacket and getting into/out of the car due to c/o (L) sided back pain (previously moderate to severe difficulty).   Patient reports difficulty with ambulation and stair-climbing due to poor balance requiring use of SPC and HHA (previously level of difficulty > several months).   Plan   Continue  with POC as indicated addressing static/dynamic balance and gait.       Goals    Goal 1: Patient will improve L/S rotation AROM to >/= 75% to improve  performance of supine-to-sit and sit-to-supine transfers.     - Goal progressing 04/28/19: 50% in standing with limited pelvic rotation noted  -JL   Sessions: 8      Goal 2: Patient will improve (B) hip/knee extensor strength to 4/5 or greater to assist with ease of stair negotiation.     - Goal progressing 05/08/19: 4+/5 (B) with closed chain eccentric control continuing to be limited with stair-negotiation -JL   Sessions: 16      Goal 3: Patient will improve (B) hip abductor strength to 4/5 or greater to assist with ease of car transfers.     - Goal progressing 05/08/19: 4/5 with difficulty with closed chain control -JL   Sessions: 16      Goal 4: Patient will improve TUG score to </= 15s to meet age matched norms and prevent risk of future falls.     - Goal met 05/08/19: 15s with mild difficulty with turning to (L) due to hip abductor strength -JL   Sessions: 16                                Kennith Maes, DPT

## 2019-05-09 NOTE — Progress Notes (Signed)
Name:Rick [name] Age: 86 y.o.   Date of Service: 05/08/2019  Referring Physician: Erasmo Score, MD   Date of Injury: 04/10/2019  Date Care Plan Established/Reviewed: 04/12/2019  Date Treatment Started: 04/12/2019  End of Certification Date: 07/10/2019  Sessions in Plan of Care: 16  Surgery Date: No data was found      Visit Count: 7   Diagnosis:   1. Acute bilateral low back pain without sciatica             Precautions: No data was found  Allergies: Patient has no known allergies.    No past medical history on file.                        Subjective     History of Present Illness   Functional Limitations (PLOF): Patient reports mild difficulty with twisting movements such as putting on jacket and getting into/out of the car due to c/o (L) sided back pain (previously moderate to severe difficulty).   Patient reports difficulty with ambulation and stair-climbing due to poor balance requiring use of SPC and HHA (previously level of difficulty > several months).     Social Support/Occupation  Lives in: multiple level home  Lives with: adult children  Occupation: retired       Precautions: No data was found  Allergies: Patient has no known allergies.    Initial Evaluation Reference and/or Current Measurements(as dated):    Functional Strength   Sit to Stand: UE assist  Step-up: 8 inches (Step-to with HHA leading with (L) up, (R) down )  Heel Raises      Bilateral: 3 with full heel raise/20 with HHA   (05/08/19) 20/20 with HHA     Balance   Left   Eyes Open (Left)(sec)      Trial 1: 0 seconds  Right   Eyes Open (Right)(sec)      Trial 1: 0 seconds  (05/08/19) 2-3'' (B) with finger assist     Flexibility Limited HS length on (L) compared to (R).     AROM: Lumbar Spine   Initial  Sitting  Initial  Standing  04/28/19   Flexion To floor  To floor with slight knee flexion     Extension 10%  10%      R L R L L   Rotation 100% 50% 100% 25% 50%   Side Bending   In flexion   to joint line In flexion   to joint line     (blank fields were intentionally left blank)      Initial   Sitting  R 05/08/19  R LE Strength  MMT /5 Initial  Sitting  L 05/08/19   L   4 4 Hip Flexion 4- 4   3+  4+  Standing Hip Extension 4-  4+  Standing   3+ 4+ Hip Abduction 4- 4with hip hiking noted   4-  Hip Adduction 4-      Hip Internal Rot       Hip External Rot     4- 4+ Quadriceps 3+ 4+   4 4+ Hamstrings 4- 4   4   Ankle Dorsiflexion 4      Ankle Plantarflexion            (blank fields were intentionally left blank)    TUG with use of SPC on (R): w/ (L) turn 23'', (R) turn 32''  (05/08/19) 15''  (B) with increased instability noted  with (L) turn     Assessment   Patient making good progress towards goals, with most limitations persisting in endurance of spinal extensors and (L) hip abduction strength continuing to affect ambulation tolerance and performance of functional transfers/transitions. Patient transitioned to hip strengthening and dynamic stabilization exercises including toe taps without HHA requiring max cueing to improve truncal extension, hip flexion, ankle DF, and glute engagement to prevent lateral truncal lean. Patient and son agreeable to D/C at last scheduled session.   Functional Limitations (PLOF): Patient reports mild difficulty with twisting movements such as putting on jacket and getting into/out of the car due to c/o (L) sided back pain (previously moderate to severe difficulty).   Patient reports difficulty with ambulation and stair-climbing due to poor balance requiring use of SPC and HHA (previously level of difficulty > several months).   Plan   Continue with POC as indicated addressing static/dynamic balance and gait.        Goals    Goal 1: Patient will improve L/S rotation AROM to >/= 75% to improve performance of supine-to-sit and sit-to-supine transfers.     - Goal progressing 04/28/19: 50% in standing with limited pelvic rotation noted  -JL   Sessions: 8      Goal 2: Patient will  improve (B) hip/knee extensor strength to 4/5 or greater to assist with ease of stair negotiation.     - Goal progressing 05/08/19: 4+/5 (B) with closed chain eccentric control continuing to be limited with stair-negotiation -JL   Sessions: 16      Goal 3: Patient will improve (B) hip abductor strength to 4/5 or greater to assist with ease of car transfers.     - Goal progressing 05/08/19: 4/5 with difficulty with closed chain control -JL   Sessions: 16      Goal 4: Patient will improve TUG score to </= 15s to meet age matched norms and prevent risk of future falls.     - Goal met 05/08/19: 15s with mild difficulty with turning to (L) due to hip abductor strength -JL   Sessions: 16                                Kennith Maes, DPT

## 2019-05-12 ENCOUNTER — Inpatient Hospital Stay: Payer: Medicare Other | Admitting: Physical Therapist

## 2019-05-12 DIAGNOSIS — M545 Low back pain, unspecified: Secondary | ICD-10-CM

## 2019-05-12 NOTE — PT/OT Therapy Note (Signed)
Name: Rick [name] Age: 86 y.o.   Date of Service: 05/12/2019  Referring Physician: Erasmo Score, MD   Date of Injury: 04/10/2019  Date Care Plan Established/Reviewed: 04/12/2019  Date Treatment Started: 04/12/2019  End of Certification Date: 07/10/2019  Sessions in Plan of Care: 16  Surgery Date: No data was found    Visit Count: 8   Diagnosis:   1. Acute bilateral low back pain without sciatica        Subjective     Social Support/Occupation  Lives in: multiple level home  Lives with: adult children  Occupation: retired     Patient reports he feels like he is having trouble getting up and getting going after sitting for a long period of time. Patient reports his back has been feeling good as he has been cautious with movement.       Precautions: No data was found  Allergies: Patient has no known allergies.    Initial Evaluation Reference and/or Current Measurements(as dated):    Functional Strength   Sit to Stand: UE assist  Step-up: 8 inches (Step-to with HHA leading with (L) up, (R) down )  Heel Raises      Bilateral: 3 with full heel raise/20 with HHA   (05/08/19) 20/20 with HHA     Balance   Left   Eyes Open (Left)(sec)      Trial 1: 0 seconds  Right   Eyes Open (Right)(sec)      Trial 1: 0 seconds  (05/08/19) 2-3'' (B) with finger assist     Flexibility Limited HS length on (L) compared to (R).     AROM: Lumbar Spine   Initial  Sitting  Initial  Standing  04/28/19   Flexion To floor  To floor with slight knee flexion     Extension 10%  10%      R L R L L   Rotation 100% 50% 100% 25% 50%   Side Bending   In flexion   to joint line In flexion   to joint line    (blank fields were intentionally left blank)      Initial   Sitting  R 05/08/19  R LE Strength  MMT /5 Initial  Sitting  L 05/08/19   L   4 4 Hip Flexion 4- 4   3+  4+  Standing Hip Extension 4-  4+  Standing   3+ 4+ Hip Abduction 4- 4with hip hiking noted   4-  Hip Adduction 4-      Hip Internal Rot       Hip External Rot      4- 4+ Quadriceps 3+ 4+   4 4+ Hamstrings 4- 4   4   Ankle Dorsiflexion 4      Ankle Plantarflexion            (blank fields were intentionally left blank)    TUG with use of SPC on (R): w/ (L) turn 23'', (R) turn 32''  (05/08/19) 15''  (B) with increased instability noted with (L) turn                         Treatment     Therapeutic Exercises - Justified to address any of the following: develop strength, endurance, ROM and/or flexibility.   - NuStep x6' with subjective taken to determine course of today's treatment         Neuromuscular Re-Education - Justified to address any of  the following: of movement, balance, coordination, kinesthetic sense, posture and/or proprioception for sitting and/or standing activities.   - Standing toe taps to step without HHA with verbal/tactile cueing to improve glute engagement to improve dynamic SLS 2x10   - Standing toe taps fwd/lateral with verbal/tactile cueing to improve quad, glute, and ES engagement 2x10 (B)   - Standing heel/toe raises with verbal/tactile cueing to decrease UE use and hip flexion respectively 2x10   - Seated marching and LAQs on airex pad with verbal/tactile cueing to improve TrA engagement 2x10 (B)  - Seated oblique twist and OH press on airex pad with verbal/tactile cueing to improve TrA engagement 2x10 each   - STS transfers from airex pad with emphasis on nose over toes and glute max/quad control 2x10     Home Exercises   Access Code: [email or web address]  URL: [email or web address]  Date: 05/05/2019  Prepared by: Laurell Roof    Exercises   Seated Hip External Rotation AROM - 2 x daily - 7 x weekly - 2 sets - 10 reps - 3 hold   Seated Hamstring Stretch - 2 x daily - 7 x weekly - 1 sets - 10 reps - 10 hold   Seated Toe Raise - 2 x daily - 7 x weekly - 2 sets - 10 reps - 3 hold   Standing Hip Extension with Leg Bent and Support - 1 x daily - 7 x weekly - 2 sets - 10 reps - 3 hold   Standing Hip Abduction with Counter Support - 1  x daily - 7 x weekly - 2 sets - 10 reps - 3 hold   Heel rises with counter support - 1 x daily - 7 x weekly - 2 sets - 10 reps - 3 hold   Standing Ankle Dorsiflexion with Table Support - 1 x daily - 7 x weekly - 2 sets - 10 reps - 3 hold            ---      ---   Total Time   Timed Minutes  38 minutes   Total Time  38 minutes        Assessment   Patient with report of limited power upon standing from sitting with proper mechanics and mm engagement reviewed with patient today. Patient continuing to demonstrate challenge with dynamic stability exercises with increasing demands with request for increased speed/power. Patient with c/o LBP with oblique twists in sitting improve with patient ed/cueing for TrA engagement. Gross endurance of extensor mm continuing to be impaired.   Plan   Continue core stabilization drills in sitting and dynamic stabilization exercises increasing power       Goals    Goal 1: Patient will improve L/S rotation AROM to >/= 75% to improve performance of supine-to-sit and sit-to-supine transfers.     - Goal progressing 04/28/19: 50% in standing with limited pelvic rotation noted  -JL   Sessions: 8      Goal 2: Patient will improve (B) hip/knee extensor strength to 4/5 or greater to assist with ease of stair negotiation.     - Goal progressing 05/08/19: 4+/5 (B) with closed chain eccentric control continuing to be limited with stair-negotiation -JL   Sessions: 16      Goal 3: Patient will improve (B) hip abductor strength to 4/5 or greater to assist with ease of car transfers.     - Goal progressing 05/08/19: 4/5 with difficulty with closed chain control -JL   Sessions:  16      Goal 4: Patient will improve TUG score to </= 15s to meet age matched norms and prevent risk of future falls.     - Goal met 05/08/19: 15s with mild difficulty with turning to (L) due to hip abductor strength -JL   Sessions: 16                                Kennith Maes, DPT

## 2019-05-15 ENCOUNTER — Inpatient Hospital Stay: Payer: Medicare Other | Admitting: Physical Therapist

## 2019-05-15 DIAGNOSIS — M545 Low back pain, unspecified: Secondary | ICD-10-CM

## 2019-05-16 NOTE — PT/OT Therapy Note (Signed)
Name: Rick [name] Age: 86 y.o.   Date of Service: 05/15/2019  Referring Physician: Erasmo Score, MD   Date of Injury: 04/10/2019  Date Care Plan Established/Reviewed: 04/12/2019  Date Treatment Started: 04/12/2019  End of Certification Date: 07/10/2019  Sessions in Plan of Care: 16  Surgery Date: No data was found    Visit Count: 9   Diagnosis:   1. Acute bilateral low back pain without sciatica        Subjective     Social Support/Occupation  Lives in: multiple level home  Lives with: adult children  Occupation: retired     Patient and son report patient has been increasingly fatigued over the last several days and will be D/Cing skilled PT services until mid-April due to undergoing surgical repair of valve replacement.       Precautions: No data was found  Allergies: Patient has no known allergies.    Initial Evaluation Reference and/or Current Measurements(as dated):    Functional Strength   Sit to Stand: UE assist  Step-up: 8 inches (Step-to with HHA leading with (L) up, (R) down )  Heel Raises      Bilateral: 3 with full heel raise/20 with HHA   (05/08/19) 20/20 with HHA     Balance   Left   Eyes Open (Left)(sec)      Trial 1: 0 seconds  Right   Eyes Open (Right)(sec)      Trial 1: 0 seconds  (05/08/19) 2-3'' (B) with finger assist     Flexibility Limited HS length on (L) compared to (R).     AROM: Lumbar Spine   Initial  Sitting  Initial  Standing  04/28/19   Flexion To floor  To floor with slight knee flexion     Extension 10%  10%      R L R L L   Rotation 100% 50% 100% 25% 50%   Side Bending   In flexion   to joint line In flexion   to joint line    (blank fields were intentionally left blank)      Initial   Sitting  R 05/08/19  R LE Strength  MMT /5 Initial  Sitting  L 05/08/19   L   4 4 Hip Flexion 4- 4   3+  4+  Standing Hip Extension 4-  4+  Standing   3+ 4+ Hip Abduction 4- 4with hip hiking noted   4-  Hip Adduction 4-      Hip Internal Rot       Hip External Rot     4- 4+  Quadriceps 3+ 4+   4 4+ Hamstrings 4- 4   4   Ankle Dorsiflexion 4      Ankle Plantarflexion            (blank fields were intentionally left blank)    TUG with use of SPC on (R): w/ (L) turn 23'', (R) turn 32''  (05/08/19) 15''  (B) with increased instability noted with (L) turn                         Treatment     Therapeutic Exercises - Justified to address any of the following: develop strength, endurance, ROM and/or flexibility.   - NuStep x6' with subjective taken to determine course of today's treatment   - Walking with RW x100' with emphasis on increased stride length and hip flexion  Neuromuscular Re-Education - Justified to address any of the following: of movement, balance, coordination, kinesthetic sense, posture and/or proprioception for sitting and/or standing activities.   - Seated core engagement on airex pad with anti-rotation press and chops/lifts (B) 2# 2x10 each   - Seated marching HF and without on airex pad 2x10 each with verbal/tactile cueing to improve truncal extension and TrA engagement   - Standing marching to patient tolerance with cueing to improve TrA engagement       Home Exercises   Access Code: [email or web address]  URL: [email or web address]  Date: 05/05/2019  Prepared by: Laurell Roof    Exercises   Seated Hip External Rotation AROM - 2 x daily - 7 x weekly - 2 sets - 10 reps - 3 hold   Seated Hamstring Stretch - 2 x daily - 7 x weekly - 1 sets - 10 reps - 10 hold   Seated Toe Raise - 2 x daily - 7 x weekly - 2 sets - 10 reps - 3 hold   Standing Hip Extension with Leg Bent and Support - 1 x daily - 7 x weekly - 2 sets - 10 reps - 3 hold   Standing Hip Abduction with Counter Support - 1 x daily - 7 x weekly - 2 sets - 10 reps - 3 hold   Heel rises with counter support - 1 x daily - 7 x weekly - 2 sets - 10 reps - 3 hold   Standing Ankle Dorsiflexion with Table Support - 1 x daily - 7 x weekly - 2 sets - 10 reps - 3 hold            ---      ---   Total  Time   Timed Minutes  38 minutes   Total Time  38 minutes        Assessment   Patient with increasing fatigue both CV and mm affecting ability to lift LEs reporting LEs "felt heavy at today's session". Son reports patient with be undergoing surgical valve replacement due to limited BF next week. Patient and son educated on need to progress to walker or quad cane versus SPC if weakness/fatigue persists to prevent risk of future falls. Patient to continue skilled PT services s/p valve replacement when cleared.    Plan   D/C until cleared post-valve replacement.       Goals    Goal 1: Patient will improve L/S rotation AROM to >/= 75% to improve performance of supine-to-sit and sit-to-supine transfers.     - Goal progressing 04/28/19: 50% in standing with limited pelvic rotation noted  -JL   Sessions: 8      Goal 2: Patient will improve (B) hip/knee extensor strength to 4/5 or greater to assist with ease of stair negotiation.     - Goal progressing 05/08/19: 4+/5 (B) with closed chain eccentric control continuing to be limited with stair-negotiation -JL   Sessions: 16      Goal 3: Patient will improve (B) hip abductor strength to 4/5 or greater to assist with ease of car transfers.     - Goal progressing 05/08/19: 4/5 with difficulty with closed chain control -JL   Sessions: 16      Goal 4: Patient will improve TUG score to </= 15s to meet age matched norms and prevent risk of future falls.     - Goal met 05/08/19: 15s with mild difficulty with turning to (L) due to hip abductor strength -JL  Sessions: 16                                Kennith Maes, DPT

## 2019-05-17 ENCOUNTER — Ambulatory Visit
Admission: RE | Admit: 2019-05-17 | Discharge: 2019-05-17 | Disposition: A | Discharge status: Home / Self Care | Payer: Medicare Other | Source: Ambulatory Visit | Attending: Internal Medicine | Admitting: Internal Medicine

## 2019-05-17 DIAGNOSIS — Z01818 Encounter for other preprocedural examination: Secondary | ICD-10-CM | POA: Insufficient documentation

## 2019-05-17 LAB — COMPREHENSIVE METABOLIC PANEL
ALT: 20 U/L (ref 0–55)
AST (SGOT): 22 U/L (ref 5–34)
Albumin/Globulin Ratio: 1.2 (ref 0.9–2.2)
Albumin: 3.6 g/dL (ref 3.5–5.0)
Alkaline Phosphatase: 63 U/L (ref 38–106)
Anion Gap: 8 (ref 5.0–15.0)
BUN: 19 mg/dL (ref 9.0–28.0)
Bilirubin, Total: 1.2 mg/dL (ref 0.2–1.2)
CO2: 26 mEq/L (ref 21–29)
Calcium: 10 mg/dL (ref 7.9–10.2)
Chloride: 108 mEq/L (ref 100–111)
Creatinine: 1.3 mg/dL (ref 0.5–1.5)
Globulin: 3 g/dL (ref 2.0–3.7)
Glucose: 82 mg/dL (ref 70–100)
Potassium: 5.2 mEq/L — ABNORMAL HIGH (ref 3.5–5.1)
Protein, Total: 6.6 g/dL (ref 6.0–8.3)
Sodium: 142 mEq/L (ref 136–145)

## 2019-05-17 LAB — CBC AND DIFFERENTIAL
Absolute NRBC: 0 10*3/uL (ref 0.00–0.00)
Basophils Absolute Automated: 0.02 10*3/uL (ref 0.00–0.08)
Basophils Automated: 0.5 %
Eosinophils Absolute Automated: 0.18 10*3/uL (ref 0.00–0.44)
Eosinophils Automated: 4.5 %
Hematocrit: 39.5 % (ref 37.6–49.6)
Hgb: 12.5 g/dL (ref 12.5–17.1)
Immature Granulocytes Absolute: 0.01 10*3/uL (ref 0.00–0.07)
Immature Granulocytes: 0.2 %
Lymphocytes Absolute Automated: 1.25 10*3/uL (ref 0.42–3.22)
Lymphocytes Automated: 31 %
MCH: 33.8 pg — ABNORMAL HIGH (ref 25.1–33.5)
MCHC: 31.6 g/dL (ref 31.5–35.8)
MCV: 106.8 fL — ABNORMAL HIGH (ref 78.0–96.0)
MPV: 12.4 fL (ref 8.9–12.5)
Monocytes Absolute Automated: 0.44 10*3/uL (ref 0.21–0.85)
Monocytes: 10.9 %
Neutrophils Absolute: 2.13 10*3/uL (ref 1.10–6.33)
Neutrophils: 52.9 %
Nucleated RBC: 0 /100 WBC (ref 0.0–0.0)
Platelets: 101 10*3/uL — ABNORMAL LOW (ref 142–346)
RBC: 3.7 10*6/uL — ABNORMAL LOW (ref 4.20–5.90)
RDW: 14 % (ref 11–15)
WBC: 4.03 10*3/uL (ref 3.10–9.50)

## 2019-05-17 LAB — PT AND APTT
PT INR: 1.1 (ref 0.9–1.1)
PT: 13.1 s — ABNORMAL HIGH (ref 10.1–12.9)
PTT: 36 s (ref 27–39)

## 2019-05-17 LAB — GFR: EGFR: >60

## 2019-05-17 LAB — HEMOLYSIS INDEX: Hemolysis Index: 5 (ref 0–18)

## 2019-05-23 ENCOUNTER — Ambulatory Visit (INDEPENDENT_AMBULATORY_CARE_PROVIDER_SITE_OTHER): Payer: Medicare Other

## 2019-05-23 DIAGNOSIS — Z20822 Contact with and (suspected) exposure to covid-19: Secondary | ICD-10-CM

## 2019-05-23 DIAGNOSIS — Z01818 Encounter for other preprocedural examination: Secondary | ICD-10-CM

## 2019-05-24 LAB — COVID-19 (SARS-COV-2): SARS CoV 2 Overall Result: NOT DETECTED

## 2019-05-26 ENCOUNTER — Ambulatory Visit
Admission: RE | Admit: 2019-05-26 | Discharge: 2019-05-26 | Disposition: A | Discharge status: Home / Self Care | Payer: Medicare Other | Source: Ambulatory Visit | Attending: Internal Medicine | Admitting: Internal Medicine

## 2019-05-26 ENCOUNTER — Encounter
Admission: RE | Disposition: A | Discharge status: Home / Self Care | Payer: Self-pay | Source: Ambulatory Visit | Attending: Internal Medicine

## 2019-05-26 DIAGNOSIS — G3184 Mild cognitive impairment, so stated: Secondary | ICD-10-CM | POA: Insufficient documentation

## 2019-05-26 DIAGNOSIS — I110 Hypertensive heart disease with heart failure: Secondary | ICD-10-CM | POA: Insufficient documentation

## 2019-05-26 DIAGNOSIS — Z951 Presence of aortocoronary bypass graft: Secondary | ICD-10-CM

## 2019-05-26 DIAGNOSIS — R0602 Shortness of breath: Secondary | ICD-10-CM | POA: Insufficient documentation

## 2019-05-26 DIAGNOSIS — N189 Chronic kidney disease, unspecified: Secondary | ICD-10-CM | POA: Insufficient documentation

## 2019-05-26 DIAGNOSIS — E785 Hyperlipidemia, unspecified: Secondary | ICD-10-CM | POA: Insufficient documentation

## 2019-05-26 DIAGNOSIS — I5040 Unspecified combined systolic (congestive) and diastolic (congestive) heart failure: Secondary | ICD-10-CM | POA: Insufficient documentation

## 2019-05-26 DIAGNOSIS — Z7982 Long term (current) use of aspirin: Secondary | ICD-10-CM | POA: Insufficient documentation

## 2019-05-26 DIAGNOSIS — Z79899 Other long term (current) drug therapy: Secondary | ICD-10-CM | POA: Insufficient documentation

## 2019-05-26 DIAGNOSIS — I350 Nonrheumatic aortic (valve) stenosis: Secondary | ICD-10-CM

## 2019-05-26 DIAGNOSIS — I2510 Atherosclerotic heart disease of native coronary artery without angina pectoris: Secondary | ICD-10-CM | POA: Insufficient documentation

## 2019-05-26 HISTORY — PX: RIGHT & LEFT HEART CATH POSSIBLE PCI: CATH25

## 2019-05-26 LAB — BLOOD GAS, VENOUS
Base Excess, Ven: 0.5 mEq/L
HCO3, Ven: 25.1 mEq/L
O2 Sat, Venous: 72.7 %
Temperature: 37
Venous Total CO2: 51.6 mEq/L
pCO2, Venous: 42.8 mmHg
pH, Ven: 7.386
pO2, Venous: 40.4 mmHg

## 2019-05-26 LAB — BLOOD GAS, ARTERIAL
Arterial Total CO2: 45.1 mEq/L — ABNORMAL HIGH (ref 24.0–30.0)
Base Excess, Arterial: -1.3 mEq/L (ref <=2.0)
HCO3, Arterial: 22.3 mEq/L — ABNORMAL LOW (ref 23.0–29.0)
O2 Sat, Arterial: 98 % (ref 95.0–100.0)
Temperature: 37
pCO2, Arterial: 35.6 mmHg (ref 35.0–45.0)
pH, Arterial: 7.414 (ref 7.350–7.450)
pO2, Arterial: 102 mmHg — ABNORMAL HIGH (ref 80.0–90.0)

## 2019-05-26 SURGERY — RIGHT & LEFT HEART CATH POSSIBLE PCI

## 2019-05-26 MED ORDER — NITROGLYCERIN IN D5W 200-5 MCG/ML-% IV SOLN VIAL
INTRAVENOUS | Status: AC | PRN
Start: 2019-05-26 — End: 2019-05-26
  Administered 2019-05-26: 200 ug via INTRA_ARTERIAL

## 2019-05-26 MED ORDER — HEPARIN SODIUM (PORCINE) 1000 UNIT/ML IJ SOLN
INTRAMUSCULAR | Status: AC
Start: 2019-05-26 — End: ?
  Filled 2019-05-26: qty 10

## 2019-05-26 MED ORDER — HEPARIN SODIUM (PORCINE) 1000 UNIT/ML IJ SOLN
INTRAMUSCULAR | Status: AC | PRN
Start: 2019-05-26 — End: 2019-05-26
  Administered 2019-05-26: 5000 [IU] via INTRAVENOUS

## 2019-05-26 MED ORDER — MIDAZOLAM HCL 1 MG/ML IJ SOLN (WRAP)
INTRAMUSCULAR | Status: AC
Start: 2019-05-26 — End: ?
  Filled 2019-05-26: qty 2

## 2019-05-26 MED ORDER — NITROGLYCERIN IN D5W 200-5 MCG/ML-% IV SOLN VIAL
INTRAVENOUS | Status: AC
Start: 2019-05-26 — End: ?
  Filled 2019-05-26: qty 10

## 2019-05-26 MED ORDER — HEPARIN (PORCINE) IN NACL 2-0.9 UNIT/ML-% IJ SOLN (WRAP)
INTRAVENOUS | Status: AC
Start: 2019-05-26 — End: ?
  Filled 2019-05-26: qty 1500

## 2019-05-26 MED ORDER — LIDOCAINE HCL 1 % IJ SOLN
INTRAMUSCULAR | Status: AC
Start: 2019-05-26 — End: ?
  Filled 2019-05-26: qty 20

## 2019-05-26 MED ORDER — HEPARIN SODIUM (PORCINE) 1000 UNIT/ML IJ SOLN
INTRAMUSCULAR | Status: AC | PRN
Start: 2019-05-26 — End: 2019-05-26
  Administered 2019-05-26: 3000 [IU] via INTRAVENOUS

## 2019-05-26 MED ORDER — IODIXANOL 320 MG/ML IV SOLN
45.00 mL | Freq: Once | INTRAVENOUS | Status: AC | PRN
Start: 2019-05-26 — End: 2019-05-26
  Administered 2019-05-26: 45 mL via INTRAVENOUS

## 2019-05-26 MED ORDER — LIDOCAINE HCL 1 % IJ SOLN
INTRAMUSCULAR | Status: AC | PRN
Start: 2019-05-26 — End: 2019-05-26
  Administered 2019-05-26: 3 mL via SUBCUTANEOUS

## 2019-05-26 MED ORDER — FENTANYL CITRATE (PF) 50 MCG/ML IJ SOLN (WRAP)
INTRAMUSCULAR | Status: AC
Start: 2019-05-26 — End: ?
  Filled 2019-05-26: qty 2

## 2019-05-26 MED ORDER — VERAPAMIL HCL 2.5 MG/ML IV SOLN
INTRAVENOUS | Status: AC
Start: 2019-05-26 — End: ?
  Filled 2019-05-26: qty 2

## 2019-05-26 NOTE — Discharge Instructions (Signed)
[facility]  Cardiovascular Interventional Radiology Discharge Instructions      We appreciate you trusting the [place] [facility] team with your care today. This packet contains important information to ensure that your discharge home is safe and that your understand your post procedure needs to include; pain, bleeding, signs of infection and nausea in the setting of sedation or anesthesia.    Radial Catheterization     Post procedure / Moderate sedation / Anesthesia  . Although you may be awake and alert in the admission recovery center a small amount of sedation remains in your system for 24 hours.  . You are advised to go directly home. If possible have someone stay with you for the next 24 hours.   . Do not drive a vehicle or operate heavy machinery for the next 48 hours.  . Do not consume alcohol, tranquilizers, sleeping medication or any other non-prescribed medication for the remainder of the day.  . Do not sign legal documents for 24 hours.   Post procedure nausea and vomiting   . Diet: begin with liquids and progress your diet as tolerated. Nausea may occur in the next 24 hours.  . If you develop nausea and vomiting after your procedure follow these steps: Stop all intake of food and liquid. Do not eat or drink anything until the nausea /vomiting subside and you start to feel better. Start your diet by eating ice chips or taking small sips of water. You may eat crackers or dry toast and or sips of clear liquids like ginger ale or apple juice. Advance your diet slowly. Avoid milk products, large, or fatty meals. If it does not improve please contact : _____________________  Site care and instructions   . Keep site clean and dry for the next 24 hours. You may shower the day after your procedure.  . Remove the dressing from site after 24 hours. You may apply a band aid. Do not apply lotions or powders to site   .  Do not tub bath or submerge the site in water for 3 days following  procedure.  .  Do not operate heavy machinery i.e. lawn mower, or lift anything greater than 10 pounds for the next 48 hours  . Utilize splint over next 48 hours as added reminder to not use arm. Check site / dressing frequently over next 24 hours   . Avoid excessive flexion/extension of wrist over the next 48 hours   . If bleeding should occur: sit down and apply firm pressure to site with your fingers for 10 - 20 minutes.  . If the bleeding stops continue to sit quietly for the next two hours keeping your wrist straight. Notify your physician   . If bleeding does not stop continue to hold pressure and call 911 immediately. Do not drive yourself  to the hospital   . Notify your physician should you have any change in sensation, color of your hand or tingling/tenderness   . Notify your physician should you have any signs of infection: redness , swelling , discharge from site or fever   Pain management   . If you have any pain or discomfort you may take over the counter medications like Tylenol or ibuprofen as allowed  . Reposition splint for comfort.   Medications / Diet   . Resume your medications unless otherwise directed   . Resume previous diet   . If you are taking metformin please hold for 48 hours post procedure     Follow Up   . Discuss follow up plan with your MD     We are open from the hours of 7 am to 6 pm Monday through Friday. If you attempt to call us after hours with a question or concern and are unable to get a hold of us, or you feel the problem may be life threatening call 911 and go to the closest emergency room   I have received and understand these discharge instructions. I have no further questions regarding these instructions.     Patient or Representative   ______________________________________________________________________          [facility]   CARDIOVASULAR INTERVENTIONAL RADIOLODY AND CARDIAC CATHETERIZATION LAB

## 2019-05-26 NOTE — Progress Notes (Signed)
CATH LAB PROCEDURE HANDOFF REPORT    INDICATIONS:   Pre TAVR    ALLERGIES:   Patient has no known allergies.     ACC BLEEDING RISK SCORE  Total Score: 60 = HIGH RISK for bleeding (greater than 3.1%)     MEDICAL HISTORY:   No past medical history on file.     NAME OF PROCEDURE:  Left and right heart cath    Instrument/sponge/needle counts, if applicable:  N/A  Specimen labeling, read labels aloud, including name:  N/A  Any applicable equipment problems to be addressed:  N/A    Per MD:  Key concerns for recovery/management: bleeding/hematoma      Lines/Drains/Airways:    Patient Lines/Drains/Airways Status    Active Lines, Drains and Airways     Name:   Placement date:   Placement time:   Site:   Days:    Peripheral IV 05/26/19 22 G Anterior;Left Forearm   05/26/19    --    Forearm   less than 1    Peripheral IV 05/26/19 20 G Anterior;Distal;Right Upper Arm   05/26/19    --    Upper Arm   less than 1         Inactive Lines, Drains and Airways     Name:   Placement date:   Placement time:   Removal date:   Removal time:   Site:   Days:    [REMOVED] Arterial Sheath 6 Fr. Right Radial   05/26/19    1414    05/26/19    1534    Radial   less than 1    [REMOVED] Arterial Sheath 6 Fr. Left Radial   05/26/19    1525    05/26/19    1534    Radial   less than 1    [REMOVED] Venous Sheath 6 Fr. Right Brachial   05/26/19    1412    05/26/19    1535    Brachial   less than 1                    MEDICATIONS:     See Epic MAR    IV Drips:  NS drip:  100 mL/hr IV      VITALS:   HR:73             Rhythm: sinus rhythm and with PVCs  BP: 158/70   O2 SAT: 98% Room Air    PROCEDURE DETAILS:   Outcomes: diagnostic cath                                           Site complications: none    Significant case events:  none    Final chest pain assessment::0/10    See Physicians Op note/ Report for details

## 2019-05-26 NOTE — Procedures (Signed)
CARDIAC CATH NOTE  [facility]    ?  Patient Name:    Rick [name]  ?  Date of Cardiac Cath:    05/26/19 3:55 PM  ?  Providers Performing:   Kenneisha Cochrane A Nancie Bocanegra  ?  Operative Procedure:   1) Right and Left heart catheterization   2) Coronary angiography  3) LV angiography  4) ultrasound-guided right and left radial access  ?  Preoperative Diagnosis:   dyspnea, valvular heart disease?    Postoperative Diagnosis:   diagnostic cath?  ?  Anesthesia:   None given -given his age of 86 years and severe symptomatic aortic valve stenosis in the context of chronic kidney disease and a creatinine of 1.3.  ?  Stents   No intervention(s)  ?  History of Present Illness:   This is a pleasant 86 y.o. male with a history of severe symptomatic aortic valve stenosis who presents for cardiac evaluation of transcatheter aortic valve replacement.  As you may recall, his past medical history is significant for:     1.  Severe symptomatic aortic valve stenosis   - a mean gradient of 38 mmHg, aortic valve area of 0.67 cm, and dimensionless index of 0.15.  2.  Systolic and diastolic heart failure with estimated ejection fraction of 30%.  3.  Mild cognitive dysfunction.    4.  Hypertensive heart disease.  5.  Dyslipidemia.   6.  Three-vessel coronary artery disease status post surgical revascularization unknown anatomy.    His main complaints today is mild shortness of breath while he goes up the stairs or walk more than 1-2 blocks.  He remains independent and functional.  His cognitive impairment remains mild and he lives with his son.  His son does not help him with his activities of daily living, but some of his instrumental activities of daily living are supplemented by his son.  He has no angina and has no history of syncope. He has no other signs of heart failure and remains compliant with his medications.  He was referred for left and right heart catheterization as part of transcatheter aortic valve  replacement work-up.    Procedure results in details     INDICATION FOR CARDIAC CATHETERIZATION:     1. TIMI SCORE (Age>65, >3 CAD risk factors, known CAD with stenosis >=50%, ASA use in past 7 days, severe angina (>= 2 episodes in 24 hours), EKG ST changes >0.5 mm, and positive cardiac enzymes).  4 points indicating 20% risk at 14 days of all cause mortality, new or recurrent myocardial infarction, or severe recurrent ischemia requiring urgent revascularization.  2. GRACE ACS RISK AND MORTALITY CALCULATOR: (Age, HR, SBP, Creatinine, Cardiac arrest at admission, ST segment deviation on EKG, abnormal cardiac enzymes, Kilip class (signs/symptoms of HF).  164 points, indicating 30% probability of death from admission to 6 months.    PROCEDURE DETAIL (RADIAL)     The risks and benefits of the procedure were discussed with the patient/family member who expressed understanding and signed the informed consent. The patient was brought into the cardiac catheterization laboratory, and placed on the exam table.     Time out was taken in front of all team members. Sedation was not given as indicated above.     Access into the right radial artery was gained using an angiocath system under ultrasound guidance. A 6 French terumo sheath was placed into the right radial artery. Heparin 5,000 units, nitroglycerin 200 mcg,was given intra-arterial  in the right radial artery to prevent radial artery occlusion.  Verapamil was not given because of severe symptomatic aortic valve stenosis.    A 260 cm J-wire was advanced into the root of the aorta via the right radial artery system. A JR4 and JL 3.5 diagnostics 5 French catheters were used to selectively engage the left and right coronary arteries, respectively. Multiple orthogonal views of the left and right coronary arteries were obtained.     Using a JR4 diagnostic catheter the vein graft to the obtuse marginal and vein graft to the right coronary artery where engaged and vein graft  angiography was performed.    There was significant tortuosity in the right upper extremity arterial system and the brachiocephalic trunk.  As such, I was not able to cross using a JL 3.5 diagnostic catheter [5 French] into the left subclavian artery to engage the left internal mammary artery.  In order to limit the amount of radiation, and to be conscious about his kidney function, I decided to obtain left radial access.  I have given a 3500 units of heparin and 200 mg of nitroglycerin intra-arterial to prevent radial artery occlusion.  Through the left radial access of advanced a 5 French diagnostic catheter [JR4] into the left upper arterial system.  LIMA was engaged.  LIMA angiography was performed.    Subsequently, catheters were taken out of the artery. There are no complications after this procedure. The total blood loss was less than 10 mL. Total saline given was approximately 80 mL. Total contrast volume use was 45cc.  The total radiation exposure was 145 mGy.    Patient was transferred out of the cardiac catheterization lab in a clinically, hemodynamically, and electrocardiographically stable condition.     Right Heart Catheterization     Access into the right (brachial) vein was gained using a micropuncture system under ultrasound guidance. A 6 French introducer sheath was placed into the right common (brachial) vein. A thermal dilution catheter was advanced into the venous system. Then it was placed into the right atrium. Right atrial pressure was measured. Right ventricular pressure was measured. Pulmonary artery pressure and wedge pressure were measured. Pulmonary artery saturation was obtained and cardiac output by Fick method was obtained. Subsequently thermodilution cardiac output were calculated. The thermodilution catheter was taken out of the body.     RIGHT HEART CATHETERIZATION RESULTS      RA   5, 8, 12 mmHg   RV   43/0 (9) mmHg    PA   35/13 mmHg and mean 20 mmHg   PCWP  15, 15 mmHg with  v-waves 27 mmHg    PA sat   72%     Fick CO  4.86 L/min    Fick CI  2.69 L/min/m2  TD CO  4.0 L/min    TD CI   2.22 L/min/m2      PVR   1.25 WU    IMPRESSION:    Normal hemodynamics    CORONARY ANGIOGRAPHY AND LEFT HEART CATHETERIZATION RESULTS     The left main coronary artery arises normally from the left sinus of Valsalva. It is a large caliber vessel. It gives rise to a left anterior descending artery and circumflex coronary system. The left main coronary artery has no obstructive angiographic coronary artery disease.     The circumflex coronary artery arises normally from the left main coronary artery. It arches laterally in the AV groove. It gives rise to a obtuse marginal 1  and a obtuse marginal 2. The circumflex coronary artery has severe diffuse disease throughout the course of the vessel.  Obtuse marginal 1 and obtuse marginal 2 are 100% totally occluded.  These fills via vein graft to obtuse marginal 1 and obtuse marginal 2.    The left anterior descending artery is a large-caliber vessel. It arises normally from the left main coronary artery. It descends in the interventricular groove. It wraps around the apex. It gives rise to a diagonal 1 and a diagonal 2. The left anterior descending artery system has severe calcified obstructive disease and is 100% totally occluded after the takeoff of the first septal perforator.  Diagonal 100% totally occluded.    The right coronary artery arises normally from the right sinus of Valsalva. It is a large caliber system.  The right coronary artery gives rise to an high takeoff RV marginal.  And is 100% totally occluded in the proximal segment.    The vein graft to the right coronary artery has TIMI-3 flow.  There is no obstructive lesion at the ostium body and anastomosis.    The vein graft to obtuse marginal 1 and obtuse marginal 2 has no obstructive lesion in the ostium body and anastomosis.    The LIMA to the LAD is patent and has no obstructive lesion at the  ostium body and anastomosis.    IMPRESSION     Severe three-vessel coronary artery disease.    Patent LIMA to the LAD, patent vein graft to obtuse marginal 1 and obtuse marginal 2 of the circumflex coronary artery, and patent vein graft to the right coronary artery.    Complications:   None  Estimated Blood loss: Minimal      Specimens Removed: None  Radial arterial access site: hemostased with TR band    Plan:   RECOMMENDATIONS     1.  Continue TAVR work-up.  2.  Post cardiac catheterization orders were placed and plan was discussed with staff member.   3.  Discharge in 2 hours.    I agree with the above impression, assessment, and plan, as stated. I saw and evaluated the patient independently. I performed a full physical examination and critically evaluated the assessment and plan as stated.       Arlyss Queen, MD, PhD, MPH  Interventional Cardiology and Structural Heart Disease  [facility], [facility]  ?  Signed by: Arlyss Queen

## 2019-05-26 NOTE — H&P (Signed)
H&P UPDATE WITH ASA/MALLAMPATTI    Date Time: 05/26/19 10:24 AM    PROCEDURE:    Left heart cath, possible percutaneous coronary intervention, Right heart cath  INDICATIONS:    dyspnea, valvular heart disease  H&P:    The history and physical including past medical, family, and social history were reviewed   and there are no significant interval changes from what is currently available in the chart  from prior evaluation. He has no complaints.  He was seen and examined by me prior   to the procedure.   ALLERGIES:    Patient has no known allergies.   LABS:      Lab Results   Component Value Date    WBC 4.03 05/17/2019    HGB 12.5 05/17/2019    HCT 39.5 05/17/2019    PLT 101 (L) 05/17/2019    NA 142 05/17/2019    K 5.2 (H) 05/17/2019    CL 108 05/17/2019    CO2 26 05/17/2019    BUN 19.0 05/17/2019    CREAT 1.3 05/17/2019    EGFR >60.0 05/17/2019    GLU 82 05/17/2019     ASA PHYSICAL STATUS    Class 2 - Mild systemic disease, no functional limitations  MALLAMPATTI AIRWAY CLASSIFICATION    Class II: Visibility of hard and soft palate, upper portion of tonsils and uvula  ACC BLEEDING RISK SCORE   Total Score: 60 = HIGH RISK for bleeding (greater than 3.1%)  PLANNED SEDATION:    ( ) NO SEDATION   (x) MODERATE SEDATION   ( ) DEEP SEDATION WITH ANESTHESIA   CONCLUSION:    The risks, benefits and alternatives of the procedure have been discussed in detail and   he has indicated that he understands the procedure, indications, and risks inherent to the   procedure and is amenable to proceeding.  All questions were answered. Informed   consent was signed and verified.      Signed by: Arlyss Queen, MD PhD

## 2019-05-26 NOTE — Progress Notes (Signed)
Peripheral IV removed. Bilateral Radial TR bands removed. CDI. Right brachial site CDI. Discharge teaching provided to patient and son. Escorted to private vehicle with son.

## 2019-05-29 ENCOUNTER — Encounter (INDEPENDENT_AMBULATORY_CARE_PROVIDER_SITE_OTHER): Payer: Self-pay | Admitting: Student in an Organized Health Care Education/Training Program

## 2019-05-30 ENCOUNTER — Telehealth: Payer: Self-pay

## 2019-05-30 NOTE — Telephone Encounter (Signed)
Structural Heart    Called and spoke w/pt's son.  Explained that per Dr Retia Passe request, pt will need to meet with the Geriatric team prior to having CTA c/a/p and [facility] surgical consult.  Reviewed pt's care team.  Pt moved from [place], [place] at the end of 2019.  Advised son that records have been requested through eHealth from Dr Viann Fish who previously saw the pt when he resided in [place].  Son understood and verbally acknowledged the information.    Completed:  Cardiac cath 05/26/19  Echo 04/04/19  *COVID19 Pfizer vaccines complete

## 2019-05-31 ENCOUNTER — Inpatient Hospital Stay
Payer: Medicare Other | Attending: Student in an Organized Health Care Education/Training Program | Admitting: Physical Therapist

## 2019-05-31 DIAGNOSIS — M545 Low back pain, unspecified: Secondary | ICD-10-CM

## 2019-05-31 NOTE — PT/OT Therapy Note (Signed)
Name: Rick [name] Age: 86 y.o.   Date of Service: 05/31/2019  Referring Physician: Erasmo Score, MD   Date of Injury: 04/10/2019  Date Care Plan Established/Reviewed: 04/12/2019  Date Treatment Started: 04/12/2019  End of Certification Date: 07/10/2019  Sessions in Plan of Care: 16  Surgery Date: No data was found    Visit Count: 10   Diagnosis:   1. Acute bilateral low back pain without sciatica        Subjective     Social Support/Occupation  Lives in: multiple level home  Lives with: adult children  Occupation: retired     Son reports patient had undergone cardiac testing recently but not surgical repair of valve as previously stated. Patient reports his low back continues to bother him slightly on (L) side with quick twisting movements or reaching. Patient and son agreeable to D/C at last scheduled session with understanding of need to adhere to HEP.       Precautions: No data was found  Allergies: Patient has no known allergies.    Initial Evaluation Reference and/or Current Measurements(as dated):    Functional Strength   Sit to Stand: UE assist  Step-up: 8 inches (Step-to with HHA leading with (L) up, (R) down )  Heel Raises      Bilateral: 3 with full heel raise/20 with HHA   (05/08/19) 20/20 with HHA     Balance   Left   Eyes Open (Left)(sec)      Trial 1: 0 seconds  Right   Eyes Open (Right)(sec)      Trial 1: 0 seconds  (05/08/19) 2-3'' (B) with finger assist     Flexibility Limited HS length on (L) compared to (R).     AROM: Lumbar Spine   Initial  Sitting  Initial  Standing  04/28/19 05/31/19   Flexion To floor  To floor with slight knee flexion   To floor    Extension 10%  10%   Limited     R L R L L R/L   Rotation 100% 50% 100% 25% 50% 100%/75%   Side Bending   In flexion   to joint line In flexion   to joint line     (blank fields were intentionally left blank)      Initial   Sitting  R 05/08/19  R LE Strength  MMT /5 Initial  Sitting  L 05/08/19   L   4 4 Hip Flexion 4- 4   3+   4+  Standing Hip Extension 4-  4+  Standing   3+ 4+ Hip Abduction 4- 4with hip hiking noted   4-  Hip Adduction 4-      Hip Internal Rot       Hip External Rot     4- 4+ Quadriceps 3+ 4+   4 4+ Hamstrings 4- 4   4   Ankle Dorsiflexion 4      Ankle Plantarflexion            (blank fields were intentionally left blank)    TUG with use of SPC on (R): w/ (L) turn 23'', (R) turn 32''  (05/08/19) 15''  (B) with increased instability noted with (L) turn                         Treatment     Therapeutic Exercises - Justified to address any of the following: develop strength, endurance, ROM and/or flexibility.   -  NuStep x6' with subjective taken to determine course of today's treatment   - Seated (B) SBing and truncal rotation with dowel and PB behind to improve truncal extension versus flexion and verbal cueing to decrease (L) lateral lean x10 each   - Seated OH flexion with dowel and PB behind to encourage truncal extension and verbal cueing to decrease (L) lateral truncal lean 2x10   - Seated pulleys rotation with gentle OP x20 (B)     Neuromuscular Re-Education - Justified to address any of the following: of movement, balance, coordination, kinesthetic sense, posture and/or proprioception for sitting and/or standing activities.   - Seated lat pull-downs with bent elbows and elbows extended RTB 2x10 each with tactile/verbal cueing to improve mm engagement and prevent compensations truncal extension   - Bent over hip extensions with tactile cueing to decrease compensatory truncal rotation 2x10 (B)       Manual Therapy - Justified to address any of the following:  Mobilization of joints and soft tissues, manipulation, manual lymphatic drainage, and/or manual traction.    Seated with UE support on chair   - STM (B) L/S paraspinals   - Gd II/III (L) L4/L5 facet mobs     Home Exercises   Access Code: [email or web address]  URL: [email or web address]  Date: 05/05/2019  Prepared by: Laurell Roof    Exercises   Seated Hip External Rotation AROM - 2 x daily - 7 x weekly - 2 sets - 10 reps - 3 hold   Seated Hamstring Stretch - 2 x daily - 7 x weekly - 1 sets - 10 reps - 10 hold   Seated Toe Raise - 2 x daily - 7 x weekly - 2 sets - 10 reps - 3 hold   Standing Hip Extension with Leg Bent and Support - 1 x daily - 7 x weekly - 2 sets - 10 reps - 3 hold   Standing Hip Abduction with Counter Support - 1 x daily - 7 x weekly - 2 sets - 10 reps - 3 hold   Heel rises with counter support - 1 x daily - 7 x weekly - 2 sets - 10 reps - 3 hold   Standing Ankle Dorsiflexion with Table Support - 1 x daily - 7 x weekly - 2 sets - 10 reps - 3 hold            ---      ---   Total Time   Timed Minutes  43 minutes   Total Time  43 minutes        Assessment   Patient with increased tissue tension/TTP (L) L/S paraspinals improved post-manuals with patient reporting reproduction of familial pain with manuals. Patient with persistent limitations in L/S extension with (L) truncal lean noted with UE movements OH and rotation improved with cueing for TrA and UE mm engagement. Patient would continue to benefit from further sessions to address spinal extension AROM and gross extensor mm strength to improve functional stability with transfers/transitions and ambulation.   Plan   Continue with POC x remaining scheduled session to emphasis extensor strengthening.       Goals    Goal 1: Patient will improve L/S rotation AROM to >/= 75% to improve performance of supine-to-sit and sit-to-supine transfers.     - Goal progressing 04/28/19: 50% in standing with limited pelvic rotation noted  -JL  - Goal met 05/31/19: 100# (R), 75% (L) with no report of difficulty with rolling over in bed -  JL   Sessions: 8      Goal 2: Patient will improve (B) hip/knee extensor strength to 4/5 or greater to assist with ease of stair negotiation.     - Goal progressing 05/08/19: 4+/5 (B) with closed chain eccentric control continuing to be limited  with stair-negotiation -JL   Sessions: 16      Goal 3: Patient will improve (B) hip abductor strength to 4/5 or greater to assist with ease of car transfers.     - Goal progressing 05/08/19: 4/5 with difficulty with closed chain control -JL   Sessions: 16      Goal 4: Patient will improve TUG score to </= 15s to meet age matched norms and prevent risk of future falls.     - Goal met 05/08/19: 15s with mild difficulty with turning to (L) due to hip abductor strength -JL   Sessions: 16                                Kennith Maes, DPT

## 2019-06-05 ENCOUNTER — Telehealth: Payer: Self-pay | Admitting: Cardiology

## 2019-06-05 NOTE — Telephone Encounter (Signed)
Verline Lema is calling to check on the status of the patient's Medical Record request she faxed over to Dr. Joya Gaskins office. I advised her I do not see any documentation to verify this fax has been received. Please fax the patient's medical records over to the fax #: [phone].

## 2019-06-06 ENCOUNTER — Telehealth: Payer: Self-pay

## 2019-06-06 NOTE — Telephone Encounter (Signed)
Structural Heart    Contacted pt's son and confirmed that the Geriatric Assessment Virtual Consult will be on 06/09/19 at 0900.  Confirmed email is [email or web address] and advised him that I would send the Zoom link to that email address.  Son aware I will reach out later the day of that appt or next Monday, 06/12/19. Son understood and verbally acknowledged the information.

## 2019-06-07 ENCOUNTER — Inpatient Hospital Stay: Payer: Medicare Other | Admitting: Physical Therapist

## 2019-06-07 DIAGNOSIS — M545 Low back pain, unspecified: Secondary | ICD-10-CM

## 2019-06-07 NOTE — PT/OT Therapy Note (Signed)
Name: Rick [name] Age: 86 y.o.   Date of Service: 06/07/2019  Referring Physician: Erasmo Score, MD   Date of Injury: 04/10/2019  Date Care Plan Established/Reviewed: 04/12/2019  Date Treatment Started: 04/12/2019  End of Certification Date: 07/10/2019  Sessions in Plan of Care: 16  Surgery Date: No data was found    Visit Count: 11   Diagnosis:   1. Acute bilateral low back pain without sciatica        Subjective     Social Support/Occupation  Lives in: multiple level home  Lives with: adult children  Occupation: retired     Patient reports c/o marked fatigue and LE "heaviness".       Precautions: No data was found  Allergies: Patient has no known allergies.    Initial Evaluation Reference and/or Current Measurements(as dated):    Functional Strength   Sit to Stand: UE assist  Step-up: 8 inches (Step-to with HHA leading with (L) up, (R) down )  Heel Raises      Bilateral: 3 with full heel raise/20 with HHA   (05/08/19) 20/20 with HHA     Balance   Left   Eyes Open (Left)(sec)      Trial 1: 0 seconds  Right   Eyes Open (Right)(sec)      Trial 1: 0 seconds  (05/08/19) 2-3'' (B) with finger assist     Flexibility Limited HS length on (L) compared to (R).     AROM: Lumbar Spine   Initial  Sitting  Initial  Standing  04/28/19 05/31/19   Flexion To floor  To floor with slight knee flexion   To floor    Extension 10%  10%   Limited     R L R L L R/L   Rotation 100% 50% 100% 25% 50% 100%/75%   Side Bending   In flexion   to joint line In flexion   to joint line     (blank fields were intentionally left blank)      Initial   Sitting  R 05/08/19  R LE Strength  MMT /5 Initial  Sitting  L 05/08/19   L   4 4 Hip Flexion 4- 4   3+  4+  Standing Hip Extension 4-  4+  Standing   3+ 4+ Hip Abduction 4- 4with hip hiking noted   4-  Hip Adduction 4-      Hip Internal Rot       Hip External Rot     4- 4+ Quadriceps 3+ 4+   4 4+ Hamstrings 4- 4   4   Ankle Dorsiflexion 4      Ankle Plantarflexion             (blank fields were intentionally left blank)    TUG with use of SPC on (R): w/ (L) turn 23'', (R) turn 32''  (05/08/19) 15''  (B) with increased instability noted with (L) turn                         Treatment     Therapeutic Exercises - Justified to address any of the following: develop strength, endurance, ROM and/or flexibility.   - NuStep x6' with subjective taken to determine course of today's treatment   - Seated LAQs 5# 2x10 each with verbal/tactile cueing to improve TKE on (L)  - Seated heel/toe raises 10# KB with verbal cueing to improve eccentric control with lowering 2x10  Neuromuscular Re-Education - Justified to address any of the following: of movement, balance, coordination, kinesthetic sense, posture and/or proprioception for sitting and/or standing activities.   - Seated lat pull-downs with bent elbows and elbows extended RTB 2x10 each with tactile/verbal cueing to improve mm engagement and prevent compensations truncal extension   - Seated RTB rows with tactile/verbal cueing to decrease scap/shoulder elevation 2x10   - Seated press, OH flexion, and truncal rotations 8# ball and PB behind T/S to encourage spinal extensor engagement 2x10 each   - Seated marching with HF 2x10 with verbal cueing to decrease compensatory truncal lean     Manual Therapy - Justified to address any of the following:  Mobilization of joints and soft tissues, manipulation, manual lymphatic drainage, and/or manual traction.    Seated with UE support on plinth  - STM (B) L/S paraspinals   - Gd II/III (L) L4/L5 facet mobs   - Gd II/III CPAs L/S     Home Exercises   Access Code: [email or web address]  URL: [email or web address]  Date: 05/05/2019  Prepared by: Laurell Roof    Exercises   Seated Hip External Rotation AROM - 2 x daily - 7 x weekly - 2 sets - 10 reps - 3 hold   Seated Hamstring Stretch - 2 x daily - 7 x weekly - 1 sets - 10 reps - 10 hold   Seated Toe Raise - 2 x daily - 7 x weekly - 2  sets - 10 reps - 3 hold   Standing Hip Extension with Leg Bent and Support - 1 x daily - 7 x weekly - 2 sets - 10 reps - 3 hold   Standing Hip Abduction with Counter Support - 1 x daily - 7 x weekly - 2 sets - 10 reps - 3 hold   Heel rises with counter support - 1 x daily - 7 x weekly - 2 sets - 10 reps - 3 hold   Standing Ankle Dorsiflexion with Table Support - 1 x daily - 7 x weekly - 2 sets - 10 reps - 3 hold            ---      ---   Total Time   Timed Minutes  38 minutes   Total Time  38 minutes        Assessment   Patient with report of increased ease of ambulation post-session with improved hip, knee, and truncal extension. Patient continuing to demonstrate poor endurance of gross extensor musculature though improving with exercise prescription.   Plan   Standing heel/toe raises and wall squats/slides.       Goals    Goal 1: Patient will improve L/S rotation AROM to >/= 75% to improve performance of supine-to-sit and sit-to-supine transfers.     - Goal progressing 04/28/19: 50% in standing with limited pelvic rotation noted  -JL  - Goal met 05/31/19: 100# (R), 75% (L) with no report of difficulty with rolling over in bed -JL   Sessions: 8      Goal 2: Patient will improve (B) hip/knee extensor strength to 4/5 or greater to assist with ease of stair negotiation.     - Goal progressing 05/08/19: 4+/5 (B) with closed chain eccentric control continuing to be limited with stair-negotiation -JL   Sessions: 16      Goal 3: Patient will improve (B) hip abductor strength to 4/5 or greater to assist with ease of car transfers.     - Goal  progressing 05/08/19: 4/5 with difficulty with closed chain control -JL   Sessions: 16      Goal 4: Patient will improve TUG score to </= 15s to meet age matched norms and prevent risk of future falls.     - Goal met 05/08/19: 15s with mild difficulty with turning to (L) due to hip abductor strength -JL   Sessions: 16                                Kennith Maes, DPT

## 2019-06-09 ENCOUNTER — Telehealth: Payer: Self-pay | Admitting: Hospice and Palliative Medicine

## 2019-06-09 NOTE — Telephone Encounter (Signed)
GERIATRIC CARDIOLOGY ASSESSMENT CONSULTATION    Date Time: 06/09/2019  8:46 AM  Patient Name: Rick [name]  Primary Care Physician: Erasmo Score, MD  Referring provider:     Reason for Consultation:   Comprehensive Geriatric Assessment  Pre procedure assessment- TAVR  Assessment/Plan:     Medical:  Dementia: Slums Score =10  Would recommend to continue Memantine.  High Risk for delirium:Recommend continued family presence and reorientation activities. Recommend avoiding benzos, hypnotics, and anticholinergics if possible.  Prevention:To help prevent delirium: maintain appropriate sleep/wake cycles.    Hearing loss- would recommend hearing evaluation. He used to have hearing aides but does not currently use them.    Nutrition Support: At risk for malnutrition. Would recommend nutrition consult.    Patient with increased fatigue and hypersomnia.Recommend attempting to maintain appropriate sleep/wake cycle. He gets up multiple times in the night to urinate. Would recommend continued urology follow up for increase nocturia.    Functional:   Frailty- Clinical Frailty Scale= 5  Essential Frailty Toolset- score of 3    Low back pain- continue physical therapy and exercise as tolerated. Patient has lidoderm patch and tylenol otc at home, but is not currently taking either.    Constipation- recommending docusate 100 mg po bid. Patient on multiple over the counter supplements which may be contributing to constipation.  Would recommend to check Vitamin D level, TSH, Vitamin B level, magnesium and calcium.     Falls Risk -Please check vitamin D level. If vit D level <30, recommend  vit D supplementation -Recommend continued PT/OT Consult to assess for gait stability/ endurance/ stamina, assistive device and home safety evaluation.      Advance Directives:     Medical POA Name:Karla Maliik Karner [phone]; [phone]              Relationship:son     Written Advanced Directives:  []  Yes    [x]  No       POLST:  []  Yes     [x]  No   They have paperwork ([place] Advance Directives), which they are going to go over.  CURRENT Code Status:  [x] Full Code        Preferences for goals of care:    Wants a trial at resuscitation.  Discussed/reconfirmed with Patient [x] Yes   [] No       FOLLOW-UP PLAN: follow up with Dr. Penny Pia.  PRN follow up with geriatrics.    Thank you for allowing [place] to participate in the care of this patient.  Tanya Nones, MD  Delaine Lame, MD  [facility]  [phone]       History:   Telemedicine Documentation Requirements  Originating site (Patient location):Home  Distant site (Provider location): [facility]  Provider and Title: Dr. Murlean Iba and Dr. Bluford Main  Consent obtained:Yes  History reviewed, summarized and obtained from:   [x]  Chart  [x]   Patient  [x]  Family [x]  Medical team    Son Kol Consuegra II also present at the patient encounter.  Rick [name] is a 86 y.o. male with PMhx dementia, cad and severe aortic valve stenosis pending workup for possible tavr procedure. He has been having chronic back pain and been participating in PT.  He moved from [place] in 2019.  Today, patient states he is feeling okay but has had some back problems. He states when he is twisting or getting in/out of the cars, he has discomfort that lasts a few seconds as he moves around. Sometimes when getting up from table,  he feels he may twist a wrong way and the low back hurts. The discomfort is described as being mild. When he has the pain he stops because he is aware of the pain. No radiation of the pain. Pain strictly in the lower back pain that lasts for a few seconds. Does not feel short of breath. Does not walk much per patient. Per son, patient usually goes up and down stairs to eat, goes to the bathroom. Takes naps during the day. 3 sets of stairs in the house, each one about 7- 8 steps. He is able to go up the stairs in one go. Gets fatigued anything over 50 feet. He walks with a cane. No falls in last 6  months.   During a typical day:  He goes to bed around 8:30-10p. Wakes up during the night to go to the bathroom. On an average night, wakes up about 3 times. No difficulty falling asleep after. wake up in the morning 10:30-11am (sleeps in as much as he can). Feels sleepy after brunch and takes a nap. Does some physical therapy exercises from the PT most days, about 30-45 minutes. Sometimes he may work on bills or read. Then feels sleepy again around 3p. Watches news around 5-6p. Then dinner.   Mostly sedentary during the day.  He had left knee replacement surgery 4 years ago, first day of PT he fell and broke right femur. Since then has had walking issues and one leg is shorter than the other.  Patient with increased urinary frequency, has seen urologist in the past. Had procedure before without much success with symptoms. Previously he was going more 4-6x per night to urinate. Now he is going about 3 times a night.  Review of Systems:   General: Fatigue and Decreased energy   Eyes: Wears Glasses   Pulm: No cough, wheeze, or congestion, no dyspnea, no pleuritic pain  Cards:No chest pain or palpitations, no orthopnea or PND  GI: Constipation   GU: Nocturia  MS: Back painlow back pain  Neuro: no focus weakness, numbness, tingling, dizziness/vertigo  Psyche: Mood is good  + difficulty hearing  Geriatric Assessment:     Geriatric Assessment:  Family: Lives with son in multi level home. Has 3 sets of stairs.   Cognition:  SLUM Score Today 10. (SLUMS- Screen for Cognitive Impairment, Results scanned into Media tab Epic)   Sleep:  Sleeps well, no concerns and but wakes up 3 x per night to go to the bathroom  Nutrition:  Appetite good, no concerns   Bladder: Continent and urinates multiple times a night. Increased frequency at night.     Bowel: Constipation and stool being hard balls and needs to strain. difficult to pass stools 2-3 x/day. This has been going on for 6 months. Has tried more water and prunes, prune juice.  No blood noticed with stools. Positive pain in the rectal area.   Falls:  No hx of falls    Hearing: some difficulty hearing tv   Vision: Wears glasses   Mood:  Mood is good, no concerns "I've accepted the method of walking with a cane, and feel like i'm limping"    _________________________________________________________________    No past medical history on file.    Past Surgical History:   Procedure Laterality Date    JOINT REPLACEMENT Left 2017    Left knee    JOINT REPLACEMENT Right 2005    Right knee    RIGHT & LEFT HEART CATH POSSIBLE  PCI N/A 05/26/2019    Procedure: RIGHT & LEFT HEART CATH POSSIBLE PCI;  Surgeon: Arlyss Queen, MD PhD;  Location: [facility];  Service: Cardiovascular;  Laterality: N/A;  [id number] scheduled with Maira in dr's office. OP. kr       Family History   Problem Relation Age of Onset    No known problems Mother     No known problems Father     No known problems Sister     No known problems Brother        Social History     Socioeconomic History    Marital status: Widowed     Spouse name: Not on file    Number of children: Not on file    Years of education: Not on file    Highest education level: Not on file   Occupational History    Not on file   Tobacco Use    Smoking status: Never Smoker    Smokeless tobacco: Never Used   Substance and Sexual Activity    Alcohol use: Never    Drug use: Never    Sexual activity: Not on file   Other Topics Concern    Not on file   Social History Narrative    Not on file     Social Determinants of Health     Financial Resource Strain:     Difficulty of Paying Living Expenses:    Food Insecurity:     Worried About Running Out of Food in the Last Year:     Barista in the Last Year:    Transportation Needs:     Freight forwarder (Medical):     Lack of Transportation (Non-Medical):    Physical Activity:     Days of Exercise per Week:     Minutes of Exercise per Session:    Stress:     Feeling of Stress :     Social Connections:     Frequency of Communication with Friends and Family:     Frequency of Social Gatherings with Friends and Family:     Attends Religious Services:     Active Member of Clubs or Organizations:     Attends Engineer, structural:     Marital Status:    Intimate Partner Violence:     Fear of Current or Ex-Partner:     Emotionally Abused:     Physically Abused:     Sexually Abused:        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  Highest level of education:Masters degree  Living environment/location:home - multi level with elevator  Lives with:son and daughter in law  Caregiver/Services:none currently  Religion/Spirituality: baptist    No Known Allergies    Prior to Admission medications    Medication Sig Start Date End Date Taking? Authorizing Provider   aspirin EC 81 MG EC tablet Take 81 mg by mouth daily    [provider]   lidocaine (Lidoderm) 5 %- he hasn't used it because pain is very intermittent. Place 1 patch onto the skin every 24 hours Remove & Discard patch within 12 hours or as directed by MD 04/10/19   Erasmo Score, MD   memantine (NAMENDA) 10 MG tablet Take 10 mg by mouth    07/04/18   [provider]   metoprolol succinate XL (Toprol XL) 25 MG 24 hr tablet Take 1 tablet (25 mg total) by mouth daily 04/03/19  Fortino Sic, MD   Multiple Vitamin (multivitamin) capsule Take 1 capsule by mouth daily    [provider]   rosuvastatin (CRESTOR) 10 MG tablet Take 1 tablet (10 mg total) by mouth daily  Patient taking differently: Take 10 mg by mouth daily    04/03/19 04/02/20  Fortino Sic, MD   OTC supplements:  Magnesium  D1  calcium  B12  Vitamin E  Suprabeta plus  Tylenol 500 mg prn for back pain, has not been using.  _____________________________________________________      Weight changes      Wt Readings from Last 3 Encounters:   05/26/19 69.4 kg (153 lb)   04/26/19 69.7 kg (153 lb 9.6 oz)   04/14/19 68 kg  (150 lb)    []   Yes [x]   No     Ht 5' 7.5'        ______________________________________________________                        Nutrition  Screening        Has food intake declined over the past 3 months due to loss of appetite, digestive problems, chewing or swallowing difficulties?     []    0 = Severe decrease in food intake  []    1 = Moderate decrease in food intake  [x]    2 = no decrease in food intake     Weight loss during the last 3 months    []    0 = Weight loss greater than 3kg (6.6lbs)  []    1 = Does not know  []    2 = Weight loss between 1 and 3 kg (2.2-6.6 lbs)  [x]    3 = No weight loss     Mobility    []    0 =  Bed or chair bound  []    1 =  Able to get out of bed/chair but does not go out  [x]    2 = Goes out     Has suffered psychological stress or acute disease in the past 3 months?    []    0 = Yes  [x]    1 = No     Neuropsychological problems    [x]    0 = Severe dementia or depression  []    1 = Mild Dementia  []    2 = No psychological problems     Body Mass Index (BMI)     []    0 = BMI less than 19  []    1 = BMI 19 to less than 21  []    2 = BMI 21 to less than 23  [x]    3 = BMI 23 or greater   Screening Score:    []    12-14 points :   Normal nutrition status  [x]    8-11 points:    At risk for malnutrition   []    0-7 points:       Malnourished           Functional Assessment:       KATZ ADL score: 6    ADL  ACTIVITY Independent Needs Assist Dependent   Bathing [x]  []  []    Ambulation []  [x]  []    Dressing [x]  []  []    Toileting [x]  []  []    Grooming [x]  []  []    Eating [x]  []  []    Notes:     IADL    ACTIVITY Independent Needs Assist Dependent  Ability to use phone [x]  []  []    Food preparation []  []  [x]    Shopping [x]  []  []    Housekeeping []  []  [x]    Finances [x]  []  []    Medication Management []  [x]  []    Transportation []  []  [x]      Time Up and Go    GAIT    [x]    Slow Tentative Pace  []    Loss of Balance  []    Short Strides  []    Little or no arm swing  [x]    Steadying self on wall  []    Shuffling   []     En bloc turning  []    Not using assistive device properly Time:       []  Less than 12 seconds noted    [x]   Greater then 12 seconds noted= 43 seconds         COGNITIVE ASSESSMENT:     SLUMS:   10/30    Geriatric Depression Scale (GDS)  (1 point for bolded answer)         1. Are you basically satisfied with your life? []   Yes [x]   No     2. Have you dropped many of your activities and interests? [x]   Yes []   No     3. Do you feel that your life is empty?  []   Yes [x]   No     4. Do you often get bored?  []   Yes [x]   No     5. Are you in good spiritis most of the time? [x]   Yes []   No     6. Are you afraid that something bad is going to happen to you?  []   Yes [x]   No     7. Do you feel happy most of the time?  [x]   Yes []   No     8. Do you often feel helpless?  []   Yes [x]   No     9. Do you prefer to stay at home, rather than go out and do new things?  []   Yes [x]   No    10. Do you feel that you have more problems with memory than most? []   Yes   [x]   No    11. Do you think it is wonderful to be alive now?  [x]   Yes []   No   12. Do you feel pretty worthless the way you are now?  []   Yes [x]   No   13. Do you feel full of energy? []   Yes [x]   No   14. Do you feel your situation is hopeless?  []   Yes [x]   No   15. Do you think that most people are better off than you are? []   Yes [x]   No     Total Score =  3   Scores:   3 +/- 2   Normal       7 +/- 3 Mildly depressed     12 +/- 2  Very depressed  _________________________________________________________________     ESSENTIAL FRAILTY TOOL SET  Chair rise-  5 chair in >/= 15 seconds = 18 seconds. Score- 1 point  Albumin- 3.6. Score= 0 points  Hemoglobin- 12.5. Score= 1 point  Cognition- deficits= score= 1 point     Score = 3                  Labs Reviewed:     Lab Results   Component  Value Date    WBC 4.03 05/17/2019    HGB 12.5 05/17/2019    HCT 39.5 05/17/2019    PLT 101 (L) 05/17/2019    CHOL 222 (H) 12/30/2018    TRIG 85 12/30/2018    HDL 76 12/30/2018    LDL 129 (H)  12/30/2018    ALT 20 05/17/2019    AST 22 05/17/2019    NA 142 05/17/2019    K 5.2 (H) 05/17/2019    CL 108 05/17/2019    CREAT 1.3 05/17/2019    BUN 19.0 05/17/2019    CO2 26 05/17/2019    INR 1.1 05/17/2019    GLU 82 05/17/2019

## 2019-06-12 ENCOUNTER — Inpatient Hospital Stay: Payer: Medicare Other | Admitting: Physical Therapist

## 2019-06-13 NOTE — Telephone Encounter (Signed)
I tried to call Kaitlyn back but did not receive an answer. I am currently faxing over the patients last office note.

## 2019-06-14 ENCOUNTER — Telehealth: Payer: Self-pay

## 2019-06-14 ENCOUNTER — Encounter (INDEPENDENT_AMBULATORY_CARE_PROVIDER_SITE_OTHER): Payer: Self-pay | Admitting: Student in an Organized Health Care Education/Training Program

## 2019-06-14 ENCOUNTER — Inpatient Hospital Stay: Payer: Medicare Other | Admitting: Physical Therapist

## 2019-06-14 DIAGNOSIS — M545 Low back pain, unspecified: Secondary | ICD-10-CM

## 2019-06-14 NOTE — Telephone Encounter (Signed)
Structural Heart    Spoke to pt's son regarding CTA order and [facility]C scheduling. Son made aware CTA order was sent to [facility]; [facility]) will reach out to get pt schedule. Instructed pt's son that once CTA is scheduled to give Randa Evens a call to set up [facility] consult. Gave my name and main# as well as Simone's #. Son understood and verbally acknowledge info given.

## 2019-06-14 NOTE — Progress Notes (Signed)
Structural Heart Program  Request for CTA order by SSB  EMR reviewed  05/17/19--Creat=1.3  GFR= >60  Allergies--NKA    Medications-- reviewed.    TAVR CTA order given to SSB

## 2019-06-14 NOTE — PT/OT Therapy Note (Signed)
Name: Rick [name] Age: 86 y.o.   Date of Service: 06/14/2019  Referring Physician: Erasmo Score, MD   Date of Injury: 04/10/2019  Date Care Plan Established/Reviewed: 04/12/2019  Date Treatment Started: 04/12/2019  End of Certification Date: 07/10/2019  Sessions in Plan of Care: 16  Surgery Date: No data was found    Visit Count: 12   Diagnosis:   1. Acute bilateral low back pain without sciatica        Subjective     Social Support/Occupation  Lives in: multiple level home  Lives with: adult children  Occupation: retired     Patient presently reporting no incidence of LBP for at least x1 week with bending, twisting, putting on jackets, or getting into/out of bed/car. Patient continues to report LE heaviness and fatigue which may be related to CV issues presently under treatment. Patient and son agreeable to D/C to HEP at this time.       Precautions: No data was found  Allergies: Patient has no known allergies.    Initial Evaluation Reference and/or Current Measurements(as dated):    Functional Strength   Sit to Stand: UE assist  Step-up: 8 inches (Step-to with HHA leading with (L) up, (R) down )  Heel Raises      Bilateral: 3 with full heel raise/20 with HHA   (05/08/19) 20/20 with HHA     Balance   Left   Eyes Open (Left)(sec)      Trial 1: 0 seconds  Right   Eyes Open (Right)(sec)      Trial 1: 0 seconds  (05/08/19) 2-3'' (B) with finger assist     Flexibility Limited HS length on (L) compared to (R).     AROM: Lumbar Spine   Initial  Sitting  Initial  Standing  04/28/19 05/31/19   Flexion To floor  To floor with slight knee flexion   To floor    Extension 10%  10%   Limited     R L R L L R/L   Rotation 100% 50% 100% 25% 50% 100%/75%   Side Bending   In flexion   to joint line In flexion   to joint line     (blank fields were intentionally left blank)      Initial   Sitting  R 05/08/19  R 06/14/19  R LE Strength  MMT /5 Initial  Sitting  L 05/08/19   L 06/14/19  L   4 4 5  Hip Flexion 4- 4 4+    3+  4+  Standing 4+ Hip Extension 4-  4+  Standing 4+   3+ 4+ 5 Hip Abduction 4- 4with hip hiking noted 4+   4-  4+ Hip Adduction 4-  4      Hip Internal Rot         Hip External Rot      4- 4+ 4+ Quadriceps 3+ 4+ 4+   4 4+ 5 Hamstrings 4- 4 4+   4  4+  Ankle Dorsiflexion 4  5      Ankle Plantarflexion      (blank fields were intentionally left blank)    TUG with use of SPC on (R): w/ (L) turn 23'', (R) turn 32''  (05/08/19) 15''  (B) with increased instability noted with (L) turn                         Treatment     Therapeutic Exercises -  Justified to address any of the following: develop strength, endurance, ROM and/or flexibility.   - NuStep x6' with subjective taken to determine course of today's treatment   - Seated LAQs 5# 2x10 each with verbal/tactile cueing to improve TKE on (L)  - Standing heel raises with HHA and tactile/verbal cueing to improve end range and eccentric control during lowering 2x10   - Standing toe raises with back against wall and verbal cueing to improve end range on (R) 2x10   - Objective assessment of LE MMT to determine progress towards goals         Neuromuscular Re-Education - Justified to address any of the following: of movement, balance, coordination, kinesthetic sense, posture and/or proprioception for sitting and/or standing activities.   - Squats with HHA on plinth to chair with tap and verbal/tactile cueing to improve gross extension upon standing 2x10   - Seated OH press with tactile cueing to improve truncal extension and engagement of posterior cuff mm 3# 2x8   - Seated chest press 5# 2x8 with verbal/tactile cueing to improve truncal/elbow extension       Manual Therapy - Justified to address any of the following:  Mobilization of joints and soft tissues, manipulation, manual lymphatic drainage, and/or manual traction.    Seated with UE support on plinth  - STM (B) L/S paraspinals   - Gd II/III (L) L4/L5 facet mobs   - Gd II/III CPAs L/S      Home Exercises   Access Code: [email or web address]  URL: [email or web address]  Date: 06/14/2019  Prepared by: Laurell Roof    Exercises  Seated Long Arc Quad - 1 x daily - 5 x weekly - 3 sets - 10 reps - 5 hold  Squat with Chair and Counter Support - 1 x daily - 5 x weekly - 2 sets - 10 reps - 3 hold  Heel rises with counter support - 1 x daily - 5 x weekly - 2 sets - 10 reps - 3 hold  Standing Toe Raises at Chair - 1 x daily - 5 x weekly - 2 sets - 10 reps - 3 hold  Seated Overhead Press with Dumbbells - 1 x daily - 5 x weekly - 2 sets - 10 reps - 3 hold  Seated Chest Press with Dumbbells - 1 x daily - 5 x weekly - 2 sets - 10 reps - 3 hold           ---      ---   Total Time   Timed Minutes  41 minutes   Total Time  41 minutes        Assessment   Patient has been seen for a total of 12 visits and has made good progress towards goals. Patient continues to demonstrate limited gross extension, though mm strength improved markedly since I.E. Patient reports no c/o LBP at this time, and is agreeable to D/C to HEP. PT reviewed exercises prescribed as part of HEP throughout session cueing for improved extension with patient and son reporting good carryover. Patient may return for skilled PT services s/p stent replacement as indicated due to report of limited CV endurance affecting functional ambulation tolerance.   Plan   Patient D/C'ed to HEP to promote improved carryover of therapeutic benefits.       Goals    Goal 1: Patient will improve L/S rotation AROM to >/= 75% to improve performance of supine-to-sit and sit-to-supine transfers.     - Goal progressing 04/28/19:  50% in standing with limited pelvic rotation noted  -JL  - Goal met 05/31/19: 100# (R), 75% (L) with no report of difficulty with rolling over in bed -JL   Sessions: 8      Goal 2: Patient will improve (B) hip/knee extensor strength to 4/5 or greater to assist with ease of stair negotiation.     - Goal progressing 05/08/19: 4+/5 (B) with closed  chain eccentric control continuing to be limited with stair-negotiation -JL  - Goal met 06/14/19 -JL   Sessions: 16      Goal 3: Patient will improve (B) hip abductor strength to 4/5 or greater to assist with ease of car transfers.     - Goal progressing 05/08/19: 4/5 with difficulty with closed chain control -JL  - Goal met 06/14/19: 4+/5 -JL   Sessions: 16      Goal 4: Patient will improve TUG score to </= 15s to meet age matched norms and prevent risk of future falls.     - Goal met 05/08/19: 15s with mild difficulty with turning to (L) due to hip abductor strength -JL   Sessions: 16                                Kennith Maes, DPT

## 2019-06-15 ENCOUNTER — Encounter (INDEPENDENT_AMBULATORY_CARE_PROVIDER_SITE_OTHER): Payer: Self-pay | Admitting: Internal Medicine

## 2019-06-16 ENCOUNTER — Other Ambulatory Visit: Payer: Self-pay | Admitting: Adult Health

## 2019-06-16 ENCOUNTER — Other Ambulatory Visit: Payer: Self-pay

## 2019-06-16 ENCOUNTER — Ambulatory Visit
Admission: RE | Admit: 2019-06-16 | Discharge: 2019-06-16 | Disposition: A | Discharge status: Home / Self Care | Payer: Self-pay | Source: Ambulatory Visit

## 2019-06-17 NOTE — Progress Notes (Signed)
Name:Rick [name] Age: 86 y.o.   Date of Service: 06/14/2019  Referring Physician: Erasmo Score, MD   Date of Injury: 04/10/2019  Date Care Plan Established/Reviewed: 04/12/2019  Date Treatment Started: 04/12/2019  End of Certification Date: 07/10/2019  Sessions in Plan of Care: 16  Surgery Date: No data was found      Visit Count: 12   Diagnosis:   1. Acute bilateral low back pain without sciatica             Precautions: No data was found  Allergies: Patient has no known allergies.    No past medical history on file.                      Subjective     Social Support/Occupation  Lives in: multiple level home  Lives with: adult children  Occupation: retired     Patient presently reporting no incidence of LBP for at least x1 week with bending, twisting, putting on jackets, or getting into/out of bed/car. Patient continues to report LE heaviness and fatigue which may be related to CV issues presently under treatment. Patient and son agreeable to D/C to HEP at this time.       Precautions: No data was found  Allergies: Patient has no known allergies.    Initial Evaluation Reference and/or Current Measurements(as dated):    Functional Strength   Sit to Stand: UE assist  Step-up: 8 inches (Step-to with HHA leading with (L) up, (R) down )  Heel Raises      Bilateral: 3 with full heel raise/20 with HHA   (05/08/19) 20/20 with HHA     Balance   Left   Eyes Open (Left)(sec)      Trial 1: 0 seconds  Right   Eyes Open (Right)(sec)      Trial 1: 0 seconds  (05/08/19) 2-3'' (B) with finger assist     Flexibility Limited HS length on (L) compared to (R).     AROM: Lumbar Spine   Initial  Sitting  Initial  Standing  04/28/19 05/31/19   Flexion To floor  To floor with slight knee flexion   To floor    Extension 10%  10%   Limited     R L R L L R/L   Rotation 100% 50% 100% 25% 50% 100%/75%   Side Bending   In flexion   to joint line In flexion   to joint line     (blank fields were intentionally left  blank)      Initial   Sitting  R 05/08/19  R 06/14/19  R LE Strength  MMT /5 Initial  Sitting  L 05/08/19   L 06/14/19  L   4 4 5  Hip Flexion 4- 4 4+   3+  4+  Standing 4+ Hip Extension 4-  4+  Standing 4+   3+ 4+ 5 Hip Abduction 4- 4with hip hiking noted 4+   4-  4+ Hip Adduction 4-  4      Hip Internal Rot         Hip External Rot      4- 4+ 4+ Quadriceps 3+ 4+ 4+   4 4+ 5 Hamstrings 4- 4 4+   4  4+  Ankle Dorsiflexion 4  5      Ankle Plantarflexion      (blank fields were intentionally left blank)    TUG with use of SPC on (R): w/ (L)  turn 23'', (R) turn 32''  (05/08/19) 15''  (B) with increased instability noted with (L) turn     Assessment   Patient has been seen for a total of 12 visits and has made good progress towards goals. Patient continues to demonstrate limited gross extension, though mm strength improved markedly since I.E. Patient reports no c/o LBP at this time, and is agreeable to D/C to HEP. PT reviewed exercises prescribed as part of HEP throughout session cueing for improved extension with patient and son reporting good carryover. Patient may return for skilled PT services s/p stent replacement as indicated due to report of limited CV endurance affecting functional ambulation tolerance.   Plan   Patient D/C'ed to HEP to promote improved carryover of therapeutic benefits.        Goals    Goal 1: Patient will improve L/S rotation AROM to >/= 75% to improve performance of supine-to-sit and sit-to-supine transfers.     - Goal progressing 04/28/19: 50% in standing with limited pelvic rotation noted  -JL  - Goal met 05/31/19: 100# (R), 75% (L) with no report of difficulty with rolling over in bed -JL   Sessions: 8      Goal 2: Patient will improve (B) hip/knee extensor strength to 4/5 or greater to assist with ease of stair negotiation.     - Goal progressing 05/08/19: 4+/5 (B) with closed chain eccentric control continuing to be limited with stair-negotiation -JL  - Goal  met 06/14/19 -JL   Sessions: 16      Goal 3: Patient will improve (B) hip abductor strength to 4/5 or greater to assist with ease of car transfers.     - Goal progressing 05/08/19: 4/5 with difficulty with closed chain control -JL  - Goal met 06/14/19: 4+/5 -JL   Sessions: 16      Goal 4: Patient will improve TUG score to </= 15s to meet age matched norms and prevent risk of future falls.     - Goal met 05/08/19: 15s with mild difficulty with turning to (L) due to hip abductor strength -JL   Sessions: 16                                Kennith Maes, DPT

## 2019-06-19 ENCOUNTER — Telehealth: Payer: Self-pay

## 2019-06-19 NOTE — Telephone Encounter (Signed)
Structural Heart    Called pt's son and set uo [facility] surgical consult on 06/28/19 @ 1300 (arrive at 1245). Son aware pt may have presurgical lab work completed the same date or on a future date depending on TAVR surgery date.  Son understood and verbally acknowledged the information.

## 2019-06-20 ENCOUNTER — Encounter (INDEPENDENT_AMBULATORY_CARE_PROVIDER_SITE_OTHER): Payer: Self-pay

## 2019-06-22 ENCOUNTER — Encounter: Payer: Self-pay | Admitting: Thoracic Surgery (Cardiothoracic Vascular Surgery)

## 2019-06-22 NOTE — Progress Notes (Signed)
Pt seen with son Rick [name]  for Teaching & Testing.  Information Packet with contact numbers given & reviewed.    Kccq/Katz- done      Hand Strength Grip Test- Kg  Average of three attempts  Rt   Hand Dominant--26.7             5-meter walk- secs-  [employer] devices used  1st-10.68  2nd-11.23  3rd-9.88      Copies of AVS-PEC orders-BBan-Medication cessation list given & reviewed with pt.    Understanding verbalized.

## 2019-06-26 ENCOUNTER — Telehealth: Payer: Self-pay

## 2019-06-26 NOTE — Telephone Encounter (Signed)
Structural Heart     Spoke w/pt's son confirmed for 06/28/2019.son aware to arrive 31 for his 100appts and to bring his current medication list.    Pt's son understood and verbally acknowledged the information.

## 2019-06-27 ENCOUNTER — Encounter: Payer: Self-pay | Admitting: Thoracic Surgery (Cardiothoracic Vascular Surgery)

## 2019-06-27 MED ORDER — MUPIROCIN 2 % EX OINT
TOPICAL_OINTMENT | Freq: Two times a day (BID) | CUTANEOUS | 0 refills | Status: DC
Start: 2019-06-27 — End: 2019-07-13

## 2019-06-28 ENCOUNTER — Ambulatory Visit: Payer: Medicare Other

## 2019-06-28 ENCOUNTER — Ambulatory Visit: Payer: Medicare Other | Attending: Thoracic Surgery (Cardiothoracic Vascular Surgery)

## 2019-06-28 ENCOUNTER — Ambulatory Visit: Payer: Medicare Other | Admitting: Thoracic Surgery (Cardiothoracic Vascular Surgery)

## 2019-06-28 VITALS — BP 105/62 | HR 60 | Temp 98.2°F | Resp 16 | Ht 67.0 in | Wt 150.0 lb

## 2019-06-28 DIAGNOSIS — Z01810 Encounter for preprocedural cardiovascular examination: Secondary | ICD-10-CM | POA: Insufficient documentation

## 2019-06-28 DIAGNOSIS — I2510 Atherosclerotic heart disease of native coronary artery without angina pectoris: Secondary | ICD-10-CM

## 2019-06-28 DIAGNOSIS — I5022 Chronic systolic (congestive) heart failure: Secondary | ICD-10-CM

## 2019-06-28 DIAGNOSIS — I350 Nonrheumatic aortic (valve) stenosis: Secondary | ICD-10-CM

## 2019-06-28 NOTE — Patient Instructions (Addendum)
Pre Procedural Testing   07/28/19   Pre-Procedural Evaluation clinic ([facility]) in [facility] ([facility] #[id number]    Park in [place]. Hours of Operation M-F 7:30am-4:30pm. Closed Weekends & Holidays.  No fasting required.  Appointment consists of blood/urine sample, CXR, & Ekg.   Hibiclens/CHG soap (2 bottles) & instructions for use will be given.  Shower with Hibiclens/CHG soap night prior & Am of procedure.  Please allow approximately 1 hour for this appointment.    Covid Testing is required 72-96 hrs prior to all procedures.  Please call [phone] to schedule appt @ [facility].     ------   07/28/19 ----    Pre Procedural RN Phone Interview        Pre procedure instructions will be reviewed & questions/concerns addressed.  Please allow approximately 1 hour for this phone call.    Procedure Date--  08/01/19  Nothing to eat or drink after 11pm night before Procedure.   Small Sips of WATER may be used to take meds AM of procedure.        Valve clinic staff will call you the day prior to procedure with an arrival time.    Arrival times Am of procedure are 0500, 0700, 0900, & 1100.  Park in [place] [address], [address], or be dropped off @ [employer] main lobby entrance.   Register @ Cardiac Cath/EP lab/Cardiac surgery check in--Ground Floor [facility].     Please follow the Medication Cessation list given to you today.   Please fill Bactroban Prescription & follow the instructions below for use.  Place a Pea sized amount on a Q-tip & apply to the inside of each nostril  Press the sides of the nose together & gently massage after application.         Visitation policy    One visitor per patient per day between the hours of 1-5pm.   One visitor may stay with the patient in the preop area.   After the procedure family members will be updated by an MD or RN via phone.      Please bring minimal belongs to hospital on day of procedure.     Valve procedure is approximately 90 minutes  Hospital stay will be 1-2  nights.     Home care post procedure, you will need a scale, thermometer, & blood pressure monitor.  For assistance After hours, Weekends, & Holidays -- Please call [phone].    Post Procedure Follow up     Structural Heart Clinic visit 3-5 days after discharge  No fasting needed you may bring a snack to this appt.   Please bring folder obtained @ discharge to this visit.   Please allow approximately 1 hr for this appointment.    Nurse Coordinator available Monday - Friday 9am-4pm     Nolon Nations RN # [phone]    Main Clinic Staff available Monday - Friday 9am-4pm     # [phone]

## 2019-06-28 NOTE — Progress Notes (Signed)
[facility] CONSULT    Date & Time: 06/28/2019 10:15 AM  Patient Name: Rick [name]; DOB  12/29/1924  Encounter Physician:Dr.Sarin, Minerva Areola  Referring Cardiologist: IMG, Dr. Penny Pia, Dr.Rajan    Reason for Consultation:   Severe aortic stenosis, TAVR eval.    History:   86 yrs old male with PMHX of severe AS, CAD s/p CABG x3 2009 with patent grafts, RBBB, HFrEF, mild cognitive impairement,thrombocytopenia(Plt 100K),  knee pain and HLD who presents to the clinic for the TAVR eval.    He was known for severe AS with preserved LVEF by echo 05/20. At that time, he didn't have persistent symptoms. Repeated 2D echo 02/21 revealed worsening AS with reduced LVEF to 30%.     He is accompanied with his son. He reports no significant exertional SOB with underlying limited activities.However, he has to stop after walking about 50 feet,most likely due to SOB per his son. He has slowed down gradually for last 18 months since he relocated from [place] to [place] to live with his son's family. Denies chest pain. Denies palpitation. Denies syncope. Denies pre-syncope. No orthopnea. No PND. He noticed leg swelling for a few days by feeling his socks are tight.     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 He lives with his son's family, and maintains most of his ADL's. No driving anymore. He uses a cane when he goes out.     NYHA: III  Angina (CCA angina class):0    Echo 04/04/2019  LVEF 30%, severe AS with MAG , AVA 0.67cm2, DI 0.15, mild to mod  MR/TR. LVIDd 5.87    Echo 07/12/2018  LVEF 50%, mild septal hypertrophy, grade I diastolic dysfx, mild MR, mod TR, severe AS with trace regurgitation, LVIDd 5.86    Cardiac Cath 06/07/19  Severe 3V CADs  LM no obstructive dz; LAD CTO after 1st septal perforator, CTO diagonal; RCA CTO at proximal seg, LCX, OM1/OM2 CTO.  Patent LIMA-LAD, SVG-OM1 and OM2, SVG to RCA    CTA chest/abdomen/pelvis 06/16/2019  aortic annulus perimeter 83, area 558mm2, aortic valve calcium score 1992,  trileaflet, aortic root angulation 48 degrees  Iliac-femoral arteries adequate    STS Risk Score:   Risk of Mortality:7.060 %   Mortality or Morbidity: 28.938%    Past Medical History:     Past Medical History:   Diagnosis Date    Atrial enlargement, bilateral Echo result 04/04/19    CAD (coronary artery disease) 05/28/19 Cath results.    Severe 3V CAD.  Patent LIMA to LAD, patent SVG to OM 1 and OM 2 of circumflex CA, & patent SVG to RCA.    Chronic combined systolic and diastolic heart failure 310/21 OV Dr. Penny Pia    EF=30%    Dyslipidemia 310/21 OV Dr. Penny Pia    HTN (hypertension) 310/21 OV Dr. Penny Pia    Mild cognitive impairment 310/21 OV Dr. Penny Pia    S/P CABG (coronary artery bypass graft)      LIMA to LAD,  SVG to OM 1 & OM 2 of circumflex CA, & SVG to RCA.       Past Surgical History:   Procedure Laterality Date    CORONARY ARTERY BYPASS GRAFT  ?????     LIMA to LAD,  SVG to OM 1 & OM 2 of circumflex CA, & SVG to RCA.    RIGHT & LEFT HEART CATH POSSIBLE PCI N/A 05/26/2019    Procedure: RIGHT & LEFT HEART CATH POSSIBLE  PCI;  Surgeon: Arlyss Queen, MD PhD;  Location: [facility];  Service: Cardiovascular;  Laterality: N/A;  [id number] scheduled with Maira in dr's office. OP. kr    TOTAL KNEE ARTHROPLASTY Left 2017    TOTAL KNEE ARTHROPLASTY Right 2005       Family History:     Family History   Problem Relation Age of Onset    No known problems Mother     No known problems Father     No known problems Sister     No known problems Brother        Social History:     Social History     Tobacco Use    Smoking status: Never Smoker    Smokeless tobacco: Never Used   Substance Use Topics    Alcohol use: Never       Allergies:   No Known Allergies    Medications:   Home Meds:  Current/Home Medications    ASPIRIN EC 81 MG EC TABLET    Take 81 mg by mouth nightly None Am of TAVR      DOCUSATE SODIUM (COLACE) 100 MG CAPSULE    Take 100 mg by mouth 2 (two) times daily Take Am of TAVR    MEMANTINE  (NAMENDA) 10 MG TABLET    Take 20 mg by mouth every evening None Am of TAVR      MULTIPLE VITAMIN (MULTIVITAMIN) CAPSULE    Take 1 capsule by mouth daily Stop 3 days prior to TAVR      ROSUVASTATIN (CRESTOR) 10 MG TABLET    Take 1 tablet (10 mg total) by mouth daily    SUPPLEMENTS NOT FOUND IN DATABASE    Take 1 tablet by mouth 2 (two) times daily Stop 3 days prior to TAVR   Super Beta plus    VITAMIN D (CHOLECALCIFEROL) 25 MCG (1000 UT) TABLET    Take 2,000 Units by mouth daily Stop 3 days prior to TAVR       Review of Systems:   General:  No Weight Gain/Loss.  HEENT:  No Hearing Loss, No Glaucoma  No loose tooth, decayed tooth or recent tooth infection  Cardiovascular:  No prior MI,No cardiogenic shock w/in 24hrs, No cardiac arrest w/in 24hrs, No cardiac procedure w/in 30 days  Yes heart failure w/in 2 weeks, NYHA class w/in 2 weeks(__I__II_x__III___IV), +leg Edema  No Porcelain aorta, No Abdominal Aortic Aneurysm, No Thoracic Aorta Aneurysm,No Claudication  No carotid Disease  No Hostile Chest  No Rheumatic Fever  Yes heart Murmur  No Atrial Fibrillation/Flutter  + conduction defect(If Yes,_x__RBBB____LBBB___SSS___1st degree___2nd degree___3rd degree AVB)  No Pacemaker/ICD  No Syncope, No Presyncope.  Respiratory:  Current/Recent smoker(<1 yrs)__x__NO_____Yes  No asthma,No Chronic Lung Disease; No Home Oxygen  + Dyspnea on Exertion,No orthopnea, No Paroxysmal Nocturnal Dyspnea  No Sleep Apnea  Gastrointestinal:  No Hiatal hernia, No GERD,  No Melena, No Liver Disease, No Peptic Ulcer Disease.  Genitourinary: No Renal Insufficiency  Hematologic:  No Anemia, No Bleeding Disorders, No Low Platelets  No History of DVT/Pulmonary Embolism  Neurological:  No Seizures, No Anxiety, No Depression, left 5th finger numbness  No Prior Stroke, No Transient Ischemic Attack  Endocrine:  No Diabetes mellitus  Other: No Immunosuppressive Rx (30days)  Joint Pain(if Yes, ___Gout___x_ DJD ____ RA) right knee  No HIV, No Cancer, No  mediastinal Radiation  No Sedentary Lifestyle.  All other ROS negative or Positives as below:    Physical Exam:  Vitals:    06/28/19 1336   BP: 105/62   Pulse: 60   Resp: 16   Temp: 98.2 F (36.8 C)   SpO2: 98%   Weight: 68 kg (150 lb)   Height: 1.702 m (5\' 7" )        GENERAL/PSYCH: Cooperative, alert and oriented.  EYES: Pupils WNL, EOM grossly WNL.  MOUTH: Dentition: WNL, NO Dentures.  NECK: Trachea at Midline/WNL, Thyroid WNL, JVP WNL.  CV: rhythm RR, S1/S2: WN, Gallops : None, Murmurs: systolic  Peirpheral Pulses: 0: absent, ->4 bounding, B=Bruit,N=No Bruits.                   Carotids                    Radial                      P.T            D.P.  R                    2 N                       2                                2                  2   L                     2 N                       2                                2                  2   LUNGS: WNL.  GI: Soft, supple, non-distended. No tenderness to palpation. Bowel sounds are present and normal. No masses or organomegaly noted.  GU: WNL.  SKIN: WNL.   EXTREMITIES: +1 pitting edema at BLEs, R>L  NEURO: CNIII-XI: WNL      Motor&Sensory: WNL, Ambulation:____WNL, or _x__with device(cane)    Cardiographics:   ECG  12/2018 NSR, RBBB, with PVCs and PRI    Labs Reviewed:   05/26/19 hgb12.5, hct 39.5%, platelet 101000  Bun 19, cr. 1.3    Assessment:   1. Severe aortic stenosis      Symptomatic      Echo revealed progressive severe AS, now reduced LVEF       Coronary angiogram completed, patent grafts, no revascularization plan      CTA chest/abdomen/pelvis per TAVR procotol completed, no alternative access needed      CV Surgeon Dr.Sarin evaluated the patient at the clinic, determined the patient at the high surgical risk, and eligible for TAVR procedure. Pt is tentatively scheduled on 08/01/19.    2. Chronic HFrEF (heart failure with reduced ejection fraction)      +DOE, +leg edema  3. Coronary artery disease involving native heart without angina  pectoris, unspecified vessel or lesion type  4. Hyperlipidemia, unspecified hyperlipidemia type  5. Preoperative cardiovascular examination  - mupirocin (BACTROBAN) 2 % ointment; Apply topically 2 (two)  times daily Place pea sized amt in nostrils twice daily. Start 5 days prior to procedure. Last dose AM of procedure.  Dispense: 22 g; Refill: 0    Plan:   1. High surgical risk, eligible for the TAVR  2. TAVR tentatively scheduled on 08/01/19.      Daleen Snook, NP  [facility]  [phone]  I have reviewed the notes, assessments and/or procedures for Gery L Hopfensperger.  I saw and examined the patient and concur with the documentation.    Clarene Duke, MD  [facility]

## 2019-07-06 ENCOUNTER — Other Ambulatory Visit: Payer: Self-pay

## 2019-07-06 DIAGNOSIS — Z01818 Encounter for other preprocedural examination: Secondary | ICD-10-CM

## 2019-07-06 NOTE — Progress Notes (Signed)
Pt's TAVR date moved to 07/13/19 (from 08/01/19). Updated pt's son, who was in complete agreement to have TAVR on 07/13/19. Pt will have all pre op labs/testing done on 07/10/19 @ [facility]. Pt's son aware to obtain bactroban prescription & start using it on 07/08/19.  Pt's son will obtain Hibiclens @ [facility] appt & use it accordingly. Orders & map sent to pt's son via email. Pt's son has my contact # and will call with questions. Will continue to follow accordingly.

## 2019-07-06 NOTE — Progress Notes (Signed)
TAVR date changed to 07/12/19- son aware and in agreement with that TAVR date. Will cont to follow accordingly

## 2019-07-10 ENCOUNTER — Ambulatory Visit (HOSPITAL_BASED_OUTPATIENT_CLINIC_OR_DEPARTMENT_OTHER): Payer: Medicare Other

## 2019-07-10 ENCOUNTER — Ambulatory Visit
Admission: RE | Admit: 2019-07-10 | Discharge: 2019-07-10 | Disposition: A | Discharge status: Home / Self Care | Payer: Medicare Other | Source: Ambulatory Visit | Attending: Thoracic Surgery (Cardiothoracic Vascular Surgery) | Admitting: Thoracic Surgery (Cardiothoracic Vascular Surgery)

## 2019-07-10 DIAGNOSIS — Z01818 Encounter for other preprocedural examination: Secondary | ICD-10-CM

## 2019-07-10 DIAGNOSIS — R9431 Abnormal electrocardiogram [ECG] [EKG]: Secondary | ICD-10-CM | POA: Insufficient documentation

## 2019-07-10 DIAGNOSIS — I4510 Unspecified right bundle-branch block: Secondary | ICD-10-CM | POA: Insufficient documentation

## 2019-07-10 DIAGNOSIS — Z0183 Encounter for blood typing: Secondary | ICD-10-CM | POA: Insufficient documentation

## 2019-07-10 DIAGNOSIS — I517 Cardiomegaly: Secondary | ICD-10-CM | POA: Insufficient documentation

## 2019-07-10 DIAGNOSIS — Z20822 Contact with and (suspected) exposure to covid-19: Secondary | ICD-10-CM | POA: Insufficient documentation

## 2019-07-10 LAB — CBC AND DIFFERENTIAL
Absolute NRBC: 0 10*3/uL (ref 0.00–0.00)
Basophils Absolute Automated: 0.03 10*3/uL (ref 0.00–0.08)
Basophils Automated: 0.7 %
Eosinophils Absolute Automated: 0.19 10*3/uL (ref 0.00–0.44)
Eosinophils Automated: 4.6 %
Hematocrit: 41 % (ref 37.6–49.6)
Hgb: 13.2 g/dL (ref 12.5–17.1)
Immature Granulocytes Absolute: 0.01 10*3/uL (ref 0.00–0.07)
Immature Granulocytes: 0.2 %
Lymphocytes Absolute Automated: 1.4 10*3/uL (ref 0.42–3.22)
Lymphocytes Automated: 33.8 %
MCH: 34.1 pg — ABNORMAL HIGH (ref 25.1–33.5)
MCHC: 32.2 g/dL (ref 31.5–35.8)
MCV: 105.9 fL — ABNORMAL HIGH (ref 78.0–96.0)
MPV: 12.1 fL (ref 8.9–12.5)
Monocytes Absolute Automated: 0.47 10*3/uL (ref 0.21–0.85)
Monocytes: 11.4 %
Neutrophils Absolute: 2.04 10*3/uL (ref 1.10–6.33)
Neutrophils: 49.3 %
Nucleated RBC: 0 /100 WBC (ref 0.0–0.0)
Platelets: 96 10*3/uL — ABNORMAL LOW (ref 142–346)
RBC: 3.87 10*6/uL — ABNORMAL LOW (ref 4.20–5.90)
RDW: 13 % (ref 11–15)
WBC: 4.14 10*3/uL (ref 3.10–9.50)

## 2019-07-10 LAB — COMPREHENSIVE METABOLIC PANEL
ALT: 15 U/L (ref 0–55)
AST (SGOT): 19 U/L (ref 5–34)
Albumin/Globulin Ratio: 1.2 (ref 0.9–2.2)
Albumin: 3.7 g/dL (ref 3.5–5.0)
Alkaline Phosphatase: 77 U/L (ref 38–106)
Anion Gap: 10 (ref 5.0–15.0)
BUN: 22 mg/dL (ref 9.0–28.0)
Bilirubin, Total: 0.7 mg/dL (ref 0.2–1.2)
CO2: 24 mEq/L (ref 22–29)
Calcium: 9.1 mg/dL (ref 7.9–10.2)
Chloride: 108 mEq/L (ref 100–111)
Creatinine: 1.4 mg/dL — ABNORMAL HIGH (ref 0.7–1.3)
Globulin: 3.2 g/dL (ref 2.0–3.6)
Glucose: 68 mg/dL — ABNORMAL LOW (ref 70–100)
Potassium: 4.2 mEq/L (ref 3.5–5.1)
Protein, Total: 6.9 g/dL (ref 6.0–8.3)
Sodium: 142 mEq/L (ref 136–145)

## 2019-07-10 LAB — ECG 12-LEAD
Atrial Rate: 78 {beats}/min
P Axis: 67 degrees
P-R Interval: 182 ms
Q-T Interval: 438 ms
QRS Duration: 150 ms
QTC Calculation (Bezet): 499 ms
R Axis: -42 degrees
T Axis: 37 degrees
Ventricular Rate: 78 {beats}/min

## 2019-07-10 LAB — URINALYSIS REFLEX TO MICROSCOPIC EXAM - REFLEX TO CULTURE
Bilirubin, UA: NEGATIVE
Blood, UA: NEGATIVE
Glucose, UA: NEGATIVE
Ketones UA: NEGATIVE
Leukocyte Esterase, UA: NEGATIVE
Nitrite, UA: NEGATIVE
Protein, UR: 30 — AB
Specific Gravity UA: 1.024 (ref 1.001–1.035)
Urine pH: 5 (ref 5.0–8.0)
Urobilinogen, UA: 4 mg/dL (ref 0.2–2.0)

## 2019-07-10 LAB — PT AND APTT
PT INR: 1.2 — ABNORMAL HIGH (ref 0.9–1.1)
PT: 13.4 s — ABNORMAL HIGH (ref 10.1–12.9)
PTT: 32 s (ref 27–39)

## 2019-07-10 LAB — COVID-19 (SARS-COV-2): SARS CoV 2 Overall Result: NOT DETECTED

## 2019-07-10 LAB — TYPE AND SCREEN
AB Screen Gel: NEGATIVE
ABO Rh: O POS

## 2019-07-10 LAB — GFR: EGFR: 57.3

## 2019-07-11 ENCOUNTER — Telehealth: Payer: Self-pay

## 2019-07-11 ENCOUNTER — Telehealth: Payer: Medicare Other

## 2019-07-11 ENCOUNTER — Encounter: Payer: Self-pay | Admitting: Thoracic Surgery (Cardiothoracic Vascular Surgery)

## 2019-07-11 LAB — HEMOGLOBIN A1C
Average Estimated Glucose: 85.3 mg/dL
Hemoglobin A1C: 4.6 % (ref 4.6–5.9)

## 2019-07-11 NOTE — Pre-Procedure Instructions (Addendum)
•   Interview with son Shimon Trowbridge II.   Procedure verified:  TAVR on 07/12/2019.   07/11/19 COVID test in EHR.   Pre-op labs, CXR, EKG per [facility] provider.   Pre-op medication instructions reinforced per [facility] provider, son verbalized understanding.     COVID vaccinations: 04/14/2019 & 05/05/19.

## 2019-07-11 NOTE — Telephone Encounter (Signed)
[facility]  TAVR procedure scheduled Wednesday, Jul 12, 2019; arrival time 0700.     Reviewed procedure date, arrival time and check-in location with Pts son; all verbally acknowledged and confirmed.

## 2019-07-12 ENCOUNTER — Inpatient Hospital Stay: Payer: Medicare Other | Admitting: Anesthesiology

## 2019-07-12 ENCOUNTER — Inpatient Hospital Stay: Admit: 2019-07-12 | Payer: Medicare Other | Admitting: Thoracic Surgery (Cardiothoracic Vascular Surgery)

## 2019-07-12 ENCOUNTER — Inpatient Hospital Stay (HOSPITAL_COMMUNITY): Payer: Medicare Other

## 2019-07-12 ENCOUNTER — Encounter
Admission: RE | Disposition: A | Discharge status: Home / Self Care | Payer: Self-pay | Source: Ambulatory Visit | Attending: Thoracic Surgery (Cardiothoracic Vascular Surgery)

## 2019-07-12 ENCOUNTER — Inpatient Hospital Stay
Admission: RE | Admit: 2019-07-12 | Discharge: 2019-07-13 | DRG: 267 | Disposition: A | Discharge status: Home / Self Care | Payer: Medicare Other | Source: Ambulatory Visit | Attending: Thoracic Surgery (Cardiothoracic Vascular Surgery) | Admitting: Thoracic Surgery (Cardiothoracic Vascular Surgery)

## 2019-07-12 ENCOUNTER — Encounter: Payer: Self-pay | Admitting: Internal Medicine

## 2019-07-12 DIAGNOSIS — I350 Nonrheumatic aortic (valve) stenosis: Secondary | ICD-10-CM

## 2019-07-12 DIAGNOSIS — Z79899 Other long term (current) drug therapy: Secondary | ICD-10-CM

## 2019-07-12 DIAGNOSIS — I130 Hypertensive heart and chronic kidney disease with heart failure and stage 1 through stage 4 chronic kidney disease, or unspecified chronic kidney disease: Secondary | ICD-10-CM | POA: Diagnosis present

## 2019-07-12 DIAGNOSIS — I340 Nonrheumatic mitral (valve) insufficiency: Secondary | ICD-10-CM

## 2019-07-12 DIAGNOSIS — N189 Chronic kidney disease, unspecified: Secondary | ICD-10-CM | POA: Diagnosis present

## 2019-07-12 DIAGNOSIS — E785 Hyperlipidemia, unspecified: Secondary | ICD-10-CM | POA: Diagnosis present

## 2019-07-12 DIAGNOSIS — Z006 Encounter for examination for normal comparison and control in clinical research program: Secondary | ICD-10-CM

## 2019-07-12 DIAGNOSIS — Z96653 Presence of artificial knee joint, bilateral: Secondary | ICD-10-CM | POA: Diagnosis present

## 2019-07-12 DIAGNOSIS — I4510 Unspecified right bundle-branch block: Secondary | ICD-10-CM | POA: Diagnosis present

## 2019-07-12 DIAGNOSIS — Z951 Presence of aortocoronary bypass graft: Secondary | ICD-10-CM

## 2019-07-12 DIAGNOSIS — Z7982 Long term (current) use of aspirin: Secondary | ICD-10-CM

## 2019-07-12 DIAGNOSIS — I2510 Atherosclerotic heart disease of native coronary artery without angina pectoris: Secondary | ICD-10-CM | POA: Diagnosis present

## 2019-07-12 DIAGNOSIS — D696 Thrombocytopenia, unspecified: Secondary | ICD-10-CM | POA: Diagnosis present

## 2019-07-12 DIAGNOSIS — G3184 Mild cognitive impairment, so stated: Secondary | ICD-10-CM | POA: Diagnosis present

## 2019-07-12 DIAGNOSIS — I5042 Chronic combined systolic (congestive) and diastolic (congestive) heart failure: Secondary | ICD-10-CM | POA: Diagnosis present

## 2019-07-12 DIAGNOSIS — I2582 Chronic total occlusion of coronary artery: Secondary | ICD-10-CM | POA: Diagnosis present

## 2019-07-12 HISTORY — PX: PERCUTANEOUS VALVULOPLASTY - AORTIC: CATH18

## 2019-07-12 HISTORY — PX: REPLACEMENT, AORTIC VALVE , EDWARDS SAPIEN: SHX5359

## 2019-07-12 LAB — ECG 12-LEAD
Atrial Rate: 48 {beats}/min
P Axis: 73 degrees
P-R Interval: 192 ms
Q-T Interval: 544 ms
QRS Duration: 156 ms
QTC Calculation (Bezet): 481 ms
R Axis: -38 degrees
T Axis: 46 degrees
Ventricular Rate: 47 {beats}/min

## 2019-07-12 LAB — ECHOCARDIOGRAM ADULT LIMITED W CLR & DOPP WAVFRM LTD
AV Area (Cont Eq VTI): 2.599
AV Area (Cont Eq VTI): 2.6
AV Mean Gradient: 4
AV Peak Velocity: 131
BP Mod LV Ejection Fraction: 38.4
IVS Diastolic Thickness (2D): 1.3
LVID diastole (2D): 5.7
LVID systole (2D): 4.7

## 2019-07-12 LAB — RED BLOOD CELLS OR HOLD
Expiration Date: 202106292359
Expiration Date: 202106302359
Expiration Date: 202106302359
Expiration Date: 202106302359
ISBT CODE: 5100
ISBT CODE: 5100
ISBT CODE: 5100
ISBT CODE: 5100
UTYPE: O POS
UTYPE: O POS
UTYPE: O POS
UTYPE: O POS

## 2019-07-12 LAB — ACTIVATED CLOTTING TIME
ACT POCT: 312 — ABNORMAL HIGH (ref 85–120)
ACT POCT: 148 — ABNORMAL HIGH (ref 85–120)
ACT POCT: 263 — ABNORMAL HIGH (ref 85–120)

## 2019-07-12 LAB — GLUCOSE WHOLE BLOOD - POCT: Whole Blood Glucose POCT: 85 mg/dL (ref 70–100)

## 2019-07-12 SURGERY — REPLACEMENT, AORTIC VALVE , EDWARDS SAPIEN
Anesthesia: Anesthesia General | Site: Groin | Wound class: Clean

## 2019-07-12 SURGERY — PERCUTANEOUS VALVULOPLASTY - AORTIC
Anesthesia: Anesthesia General

## 2019-07-12 MED ORDER — SODIUM CHLORIDE 0.9 % IV SOLN
INTRAVENOUS | Status: DC | PRN
Start: 2019-07-12 — End: 2019-07-12
  Administered 2019-07-12: 10:00:00 .5 ug/kg/h via INTRAVENOUS

## 2019-07-12 MED ORDER — CEFUROXIME SODIUM 1.5 G IJ/IV SOLR (WRAP)
Status: DC | PRN
Start: 2019-07-12 — End: 2019-07-12
  Administered 2019-07-12: 1.5 g via INTRAVENOUS

## 2019-07-12 MED ORDER — CLOPIDOGREL BISULFATE 75 MG PO TABS
300.0000 mg | ORAL_TABLET | Freq: Once | ORAL | Status: AC
Start: 2019-07-12 — End: 2019-07-12

## 2019-07-12 MED ORDER — HEPARIN SODIUM (PORCINE) 1000 UNIT/ML IJ SOLN
INTRAMUSCULAR | Status: AC
Start: 2019-07-12 — End: ?
  Filled 2019-07-12: qty 10

## 2019-07-12 MED ORDER — PROPOFOL 10 MG/ML IV EMUL (WRAP)
INTRAVENOUS | Status: AC
Start: 2019-07-12 — End: ?
  Filled 2019-07-12: qty 20

## 2019-07-12 MED ORDER — CHLORHEXIDINE GLUCONATE 0.12 % MT SOLN
OROMUCOSAL | Status: AC
Start: 2019-07-12 — End: 2019-07-12
  Administered 2019-07-12: 08:00:00 15 mL via OROMUCOSAL
  Filled 2019-07-12: qty 15

## 2019-07-12 MED ORDER — TRAMADOL HCL 50 MG PO TABS
50.0000 mg | ORAL_TABLET | ORAL | Status: DC | PRN
Start: 2019-07-12 — End: 2019-07-13

## 2019-07-12 MED ORDER — PROTAMINE SULFATE 10 MG/ML IV SOLN
INTRAVENOUS | Status: DC | PRN
Start: 2019-07-12 — End: 2019-07-12
  Administered 2019-07-12: 50 mg via INTRAVENOUS

## 2019-07-12 MED ORDER — MEMANTINE HCL 10 MG PO TABS
20.0000 mg | ORAL_TABLET | Freq: Every day | ORAL | Status: DC
Start: 2019-07-12 — End: 2019-07-13
  Administered 2019-07-12 – 2019-07-13 (×2): 20 mg via ORAL
  Filled 2019-07-12 (×2): qty 2

## 2019-07-12 MED ORDER — PHENYLEPHRINE 100 MCG/ML IV BOLUS (ANESTHESIA)
PREFILLED_SYRINGE | INTRAVENOUS | Status: DC | PRN
Start: 2019-07-12 — End: 2019-07-12
  Administered 2019-07-12 (×2): 100 ug via INTRAVENOUS

## 2019-07-12 MED ORDER — ONDANSETRON HCL 4 MG/2ML IJ SOLN
4.0000 mg | Freq: Three times a day (TID) | INTRAMUSCULAR | Status: DC | PRN
Start: 2019-07-12 — End: 2019-07-13

## 2019-07-12 MED ORDER — IODIXANOL 320 MG/ML IV SOLN
50.00 mL | Freq: Once | INTRAVENOUS | Status: AC | PRN
Start: 2019-07-12 — End: 2019-07-12
  Administered 2019-07-12: 11:00:00 50 mL via INTRA_ARTERIAL

## 2019-07-12 MED ORDER — CEFUROXIME SODIUM 1.5 G IJ/IV SOLR (WRAP)
Status: AC
Start: 2019-07-12 — End: ?
  Filled 2019-07-12: qty 1500

## 2019-07-12 MED ORDER — HEPARIN SODIUM (PORCINE) 5000 UNIT/ML IJ SOLN
5000.0000 [IU] | Freq: Three times a day (TID) | INTRAMUSCULAR | Status: DC
Start: 2019-07-13 — End: 2019-07-13
  Administered 2019-07-13: 09:00:00 5000 [IU] via SUBCUTANEOUS
  Filled 2019-07-12: qty 1

## 2019-07-12 MED ORDER — SODIUM CHLORIDE 0.9 % IV SOLN
INTRAVENOUS | Status: DC | PRN
Start: 2019-07-12 — End: 2019-07-12

## 2019-07-12 MED ORDER — BISACODYL 10 MG RE SUPP
10.0000 mg | Freq: Every day | RECTAL | Status: DC | PRN
Start: 2019-07-12 — End: 2019-07-13

## 2019-07-12 MED ORDER — ASPIRIN 81 MG PO CHEW
CHEWABLE_TABLET | ORAL | Status: AC
Start: 2019-07-12 — End: 2019-07-12
  Administered 2019-07-12: 09:00:00 81 mg via ORAL
  Filled 2019-07-12: qty 1

## 2019-07-12 MED ORDER — ACETAMINOPHEN 325 MG PO TABS
650.0000 mg | ORAL_TABLET | ORAL | Status: DC | PRN
Start: 2019-07-12 — End: 2019-07-13

## 2019-07-12 MED ORDER — LIDOCAINE HCL 1 % IJ SOLN
INTRAMUSCULAR | Status: AC | PRN
Start: 2019-07-12 — End: 2019-07-12
  Administered 2019-07-12: 20 mL via SUBCUTANEOUS

## 2019-07-12 MED ORDER — CLOPIDOGREL BISULFATE 75 MG PO TABS
ORAL_TABLET | ORAL | Status: AC
Start: 2019-07-12 — End: 2019-07-12
  Administered 2019-07-12: 08:00:00 300 mg via ORAL
  Filled 2019-07-12: qty 4

## 2019-07-12 MED ORDER — FENTANYL CITRATE (PF) 50 MCG/ML IJ SOLN (WRAP)
INTRAMUSCULAR | Status: DC | PRN
Start: 2019-07-12 — End: 2019-07-12
  Administered 2019-07-12: 25 ug via INTRAVENOUS

## 2019-07-12 MED ORDER — FENTANYL CITRATE (PF) 50 MCG/ML IJ SOLN (WRAP)
INTRAMUSCULAR | Status: AC
Start: 2019-07-12 — End: ?
  Filled 2019-07-12: qty 2

## 2019-07-12 MED ORDER — SENNOSIDES-DOCUSATE SODIUM 8.6-50 MG PO TABS
1.0000 | ORAL_TABLET | Freq: Every evening | ORAL | Status: DC
Start: 2019-07-12 — End: 2019-07-13
  Administered 2019-07-12: 21:00:00 1 via ORAL
  Filled 2019-07-12: qty 1

## 2019-07-12 MED ORDER — LACTATED RINGERS IV SOLN
INTRAVENOUS | Status: DC | PRN
Start: 2019-07-12 — End: 2019-07-12

## 2019-07-12 MED ORDER — ONDANSETRON 4 MG PO TBDP
4.0000 mg | ORAL_TABLET | Freq: Three times a day (TID) | ORAL | Status: DC | PRN
Start: 2019-07-12 — End: 2019-07-13

## 2019-07-12 MED ORDER — HEPARIN SODIUM (PORCINE) 1000 UNIT/ML IJ SOLN
INTRAMUSCULAR | Status: DC | PRN
Start: 2019-07-12 — End: 2019-07-12
  Administered 2019-07-12: 8000 [IU] via INTRAVENOUS

## 2019-07-12 MED ORDER — NOREPINEPHRINE BITARTRATE 1 MG/ML IV SOLN
INTRAVENOUS | Status: DC | PRN
Start: 2019-07-12 — End: 2019-07-12
  Administered 2019-07-12: 10:00:00 2 ug/min via INTRAVENOUS

## 2019-07-12 MED ORDER — HEPARIN (PORCINE) IN NACL 2000-0.9 UNIT/L-% IV SOLN
INTRAVENOUS | Status: AC | PRN
Start: 2019-07-12 — End: 2019-07-12
  Administered 2019-07-12: 4000 [IU]

## 2019-07-12 MED ORDER — ASPIRIN 81 MG PO CHEW
81.0000 mg | CHEWABLE_TABLET | Freq: Once | ORAL | Status: AC
Start: 2019-07-12 — End: 2019-07-12

## 2019-07-12 MED ORDER — PROTAMINE SULFATE 10 MG/ML IV SOLN
INTRAVENOUS | Status: AC
Start: 2019-07-12 — End: ?
  Filled 2019-07-12: qty 25

## 2019-07-12 MED ORDER — LIDOCAINE HCL (PF) 2 % IJ SOLN
INTRAMUSCULAR | Status: AC
Start: 2019-07-12 — End: ?
  Filled 2019-07-12: qty 5

## 2019-07-12 MED ORDER — ROSUVASTATIN CALCIUM 10 MG PO TABS
10.0000 mg | ORAL_TABLET | Freq: Every evening | ORAL | Status: DC
Start: 2019-07-12 — End: 2019-07-13
  Administered 2019-07-12: 20:00:00 10 mg via ORAL
  Filled 2019-07-12: qty 1

## 2019-07-12 MED ORDER — ASPIRIN 81 MG PO CHEW
81.0000 mg | CHEWABLE_TABLET | Freq: Every day | ORAL | Status: DC
Start: 2019-07-12 — End: 2019-07-12

## 2019-07-12 MED ORDER — LACTATED RINGERS IV SOLN
INTRAVENOUS | Status: DC
Start: 2019-07-12 — End: 2019-07-13

## 2019-07-12 MED ORDER — NOREPINEPHRINE 8 MG/100 ML (SIMPLE)
Status: DC
Start: 2019-07-12 — End: 2019-07-12
  Filled 2019-07-12: qty 300

## 2019-07-12 MED ORDER — ASPIRIN 81 MG PO TBEC
81.0000 mg | DELAYED_RELEASE_TABLET | ORAL | Status: DC
Start: 2019-07-13 — End: 2019-07-13
  Administered 2019-07-13: 09:00:00 81 mg via ORAL
  Filled 2019-07-12: qty 1

## 2019-07-12 MED ORDER — PROPOFOL INFUSION 10 MG/ML
INTRAVENOUS | Status: DC | PRN
Start: 2019-07-12 — End: 2019-07-12
  Administered 2019-07-12: 50 ug/kg/min via INTRAVENOUS

## 2019-07-12 MED ORDER — CHLORHEXIDINE GLUCONATE 0.12 % MT SOLN
15.0000 mL | Freq: Once | OROMUCOSAL | Status: AC
Start: 2019-07-12 — End: 2019-07-12

## 2019-07-12 MED ORDER — PROPOFOL 10 MG/ML IV EMUL (WRAP)
INTRAVENOUS | Status: AC
Start: 2019-07-12 — End: ?
  Filled 2019-07-12: qty 50

## 2019-07-12 MED ORDER — PHENYLEPHRINE 100 MCG/ML IV SOSY (WRAP)
PREFILLED_SYRINGE | INTRAVENOUS | Status: AC
Start: 2019-07-12 — End: ?
  Filled 2019-07-12: qty 10

## 2019-07-12 MED ORDER — CLOPIDOGREL BISULFATE 75 MG PO TABS
75.0000 mg | ORAL_TABLET | ORAL | Status: DC
Start: 2019-07-13 — End: 2019-07-13
  Administered 2019-07-13: 09:00:00 75 mg via ORAL
  Filled 2019-07-12: qty 1

## 2019-07-12 SURGICAL SUPPLY — 48 items
CATH DX EXPO AL1 6F 100CM (Catheter Micellaneous) ×1
CATH DX INFINIT PIGTL 5F 110CM (Catheter Micellaneous) ×1
CATH EXPO PIG 145DEG 6FX110CM (Catheter Micellaneous) ×1
CATH PACING ELECTRODE 5F 123CM (Catheter) ×1
CATHETER ANGIO TRILON 145D PGTL CRV EXPO (Catheter Micellaneous) ×1
CATHETER ANGIO TRILON AL1 CRV EXPO 6FR (Catheter Micellaneous) ×1
CATHETER ANGIO VESTAN SS STRG PGTL CRV (Catheter Micellaneous) ×1
CATHETER ELECTROPHYSIOLOGY L125 CM OD5 (Catheter) ×1
CATHETER ELECTROPHYSIOLOGY L125 CM OD5 FR 2.5 CM SPACE TEMPORARY PACE (Catheter) ×1 IMPLANT
CATHETER EP PLTN WVN 2.5CM SPC GOETZ 5FR (Catheter) ×1
CATHETER OD5 FR L110 CM LARGE INNER (Catheter Micellaneous) ×1
CATHETER OD5 FR L110 CM LARGE INNER LUMEN 6 SIDEHOLE RADIOPAQUE (Catheter Miscellaneous) ×1 IMPLANT
CATHETER OD6 FR L100 CM WIRE BRAID (Catheter Micellaneous) ×1
CATHETER OD6 FR L100 CM WIRE BRAID ROBUST SHAFT AL1 CURVE PERIPHERAL (Catheter Miscellaneous) ×1 IMPLANT
CATHETER OD6 FR L110 CM FULL LENGTH WIRE (Catheter Micellaneous) ×1
CATHETER OD6 FR L110 CM FULL LENGTH WIRE BRAID ROBUST SHAFT 145 D (Catheter Miscellaneous) ×1 IMPLANT
CLOSURE DEV PERCLOSE PROGLD 6 (Vascular Clsr Devices) ×4
DEVICE CLOSURE PROGLIDE PERCLOSE SUTURE (Vascular Clsr Devices) ×4
DEVICE CLSR PRGLD PRCLS 6FR STRL SUT (Vascular Clsr Devices) ×4
DEVICE PROGLIDE PERCLOSE CLOSURE SUTURE MEDIATE KNOT PUSH VASCULAR OD6 (Vascular Clsr Devices) ×4 IMPLANT
GUIDEWIRE VASC PTFE 3MM RDS J CRV .035IN (Guidwire) ×1
GUIDEWIRE VASC SS PTFE 3CM 3MM RDS CRV (Guidwire) ×1
GUIDEWIRE VASC SS PTFE 3CM STRG TPR NWTN (Guidwire) ×1
GUIDEWIRE VASC SS PTFE 3MM RDS 3CM CRV (Guidwire) ×1
GUIDEWIRE VASCULAR OD.035 IN L145 CM L10 (Guidwire) ×1
GUIDEWIRE VASCULAR OD.035 IN L145 CM L10 CM NEWTON L3 CM STRAIGHT (Guidwire) ×1 IMPLANT
GUIDEWIRE VASCULAR OD.035 IN L150 CM 3 (Guidwire) ×1
GUIDEWIRE VASCULAR OD.035 IN L150 CM 3 MM RADIUS J CURVE FIX CORE PTFE (Guidwire) ×1 IMPLANT
GUIDEWIRE VASCULAR OD.035 IN L180 CM L7 (Guidwire) ×1
GUIDEWIRE VASCULAR OD.035 IN L180 CM L7 CM AMPLATZ 3 MM RADIUS L3 CM (Guidwire) ×1 IMPLANT
GUIDEWIRE VASCULAR OD.035 IN L260 CM L7 (Guidwire) ×1
GUIDEWIRE VASCULAR OD.035 IN L260 CM L7 CM AMPLATZ 3 CM 3 MM RADIUS (Guidwire) ×1 IMPLANT
GUIDEWIRE XSTIFF CVD AES.035I (Guidwire) ×1
GW AMPLTZ XSTFF CRV .035 260CM (Guidwire) ×1
GW EMERALD J TIP .035INX150CM (Guidwire) ×1
GW NWTN STR LT .035 145X10CM (Guidwire) ×1
INTRODUCER SHEATH OD6 FR L23 CM AVANTI+ (Sheaths) ×1
INTRODUCER SHEATH OD6 FR L23 CM AVANTI+ MID LENGTH GREEN (Sheaths) ×1 IMPLANT
INTRODUCER SHTH MID LGTH AVNT+ 6FR 23CM (Sheaths) ×1
KIT MCTRDCR TUNG NTNL MAX MNSTCK 4FR 21 (Introducer) ×1
KIT MICROINTRODUCER L7 CM MAX COAXIAL (Introducer) ×1
KIT MICROINTRODUCER L7 CM MAX COAXIAL STIFFEN GUIDEWIRE ECHOGENIC (Introducer) ×1 IMPLANT
KIT MNSTK MAX NTNL ECHO 4F STF (Introducer) ×1
SHEATH INTRO SS .035IN PNCL 6FR 10CM (Sheaths) ×1
SHEATH INTRODUCER .035 IN L10 CM L2.5 CM ID6 FR SNAP ON DILATOR LOCK (Sheaths) ×1 IMPLANT
SHEATH INTRODUCER L10 CM .035 IN SNAP ON (Sheaths) ×1
SHEATH PINNACLE 6FX10CM (Sheaths) ×1
SHTH 6F X 23CM SHEATH (Sheaths) ×1

## 2019-07-12 SURGICAL SUPPLY — 57 items
ADHESIVE SKIN CLOSURE DERMABOND MINI .36 (Suture) ×2 IMPLANT
ADHESIVE SKIN CLOSURE DERMABOND MINI .36 ML LIQUID APPLICATOR (Suture) ×1 IMPLANT
APPLICATOR CHLORAPREP 26 ML 70% ISOPROPYL ALCOHOL 2% CHLORHEXIDINE (Applicator) ×5 IMPLANT
APPLICATOR PRP 70% ISPRP 2% CHG 26ML (Applicator) ×5 IMPLANT
ATRION QL38 ×1 IMPLANT
CHLORAPREP APLCTR ORANGE 26ML (Applicator) ×5
COVER PROBE SOFT FLEX L96 IN X W6 IN GEL (Procedure Accessories) ×2 IMPLANT
COVER PROBE SOFT FLEX L96 IN X W6 IN GEL ULTRASOUND POLYURETHANE (Procedure Accessories) ×2 IMPLANT
DERMABOND MINI (Suture) ×2
DRAPE BILATERAL PERINEAL (Drape) ×1
DRAPE PROBE SOFT 6X96 (Procedure Accessories) ×2
DRAPE SRG CNTRL + SMS CVARTS 84X80IN LF (Drape) ×1 IMPLANT
DRAPE SURGICAL IMPERVIOUS UMBILICUS COVER L84 IN X W80 IN CVARTS (Drape) ×1 IMPLANT
DRESSING FM SIL OPTFM 4X4IN LF STRL ADH (Dressing) ×2 IMPLANT
DRESSING FM SIL OPTFM 7X7IN LF STRL ADH (Dressing) ×1 IMPLANT
DRESSING L4 IN X W4 IN FOAM SILICONE ADHESIVE WATERPROOF ABSORBENT (Dressing) ×2 IMPLANT
DRESSING OPTIFOAM GENTLE 4X4 (Dressing) ×2
DRESSING SILICONE 7X7IN (Dressing) ×1
ELECTRODE ADULT PATIENT RETURN L9 FT REM POLYHESIVE ACRYLIC FOAM (Procedure Accessories) ×1 IMPLANT
ELECTRODE DEFIB QIK COMBO ADLT (Procedure Accessories) ×1
ELECTRODE DEFIBRILLATOR ADULT L24 IN (Procedure Accessories) ×1 IMPLANT
ELECTRODE PATIENT RETURN L9 FT VALLEYLAB (Procedure Accessories) ×1 IMPLANT
GAUZE PADS 2 X 2 (Dressing) ×2
GLOVE SURG INDCTR STRL SZ 8 RT (Glove) ×1
GLOVE SURGICAL 7.5 BIOGEL PI MICRO (Glove) ×1 IMPLANT
GLOVE SURGICAL 7.5 BIOGEL PI MICRO POWDER FREE ROUGH BEAD CUFF (Glove) ×1 IMPLANT
GLOVE SURGICAL 8 BIOGEL PI INDICATOR (Glove) ×1 IMPLANT
GLOVE SURGICAL 8 BIOGEL PI INDICATOR UNDERGLOVE POWDER FREE SMOOTH (Glove) ×1 IMPLANT
GLOVES BIOGEL PI MICRO SZ 7.5 (Glove) ×1
KIT AOR VLV 29MM CMNDR SAPIEN 3 ATRION (Valve) ×1 IMPLANT
KIT AORTIC VALVE 29 MM TRANSCATHETER BALLOON CATHETER CRIMPER (Valve) IMPLANT
PAD ELECTROSRG GRND REM W CRD (Procedure Accessories) ×1
SET EXTEN 4ML 30IN LL NONLTX (IV Supply) ×2
SET EXTENSION L29 IN STRAIGHT MALE LUER (IV Supply) ×2 IMPLANT
SET EXTENSION L29 IN STRAIGHT MALE LUER LOCK ADAPTER STANDARD BORE (IV Supply) ×2 IMPLANT
SLEEVE CMPR MED KN LGTH KDL SCD 21- IN (Sleeve) ×1 IMPLANT
SLEEVE COMPRESSION MEDIUM KNEE LENGTH KENDALL SEQUENTIAL OD21- IN (Sleeve) ×1 IMPLANT
SLEEVE SEQ COMP KNEE REGULAR (Sleeve) ×1
SOL NACL.9% 1000ML IRR NONLTX (Irrigation Solutions) ×2
SOLUTION IRRIGATION 0.9% SODIUM CHLORIDE (Irrigation Solutions) ×2 IMPLANT
SOLUTION IRRIGATION 0.9% SODIUM CHLORIDE 1000 ML PLASTIC POUR BOTTLE (Irrigation Solutions) ×2 IMPLANT
SPONGE GAUZE L2 IN X W2 IN 12 PLY USP TYPE VII CUT EDGE HIGH (Dressing) IMPLANT
SPONGE GZE CTTN 2X2IN LF NS 12 PLY USP (Dressing) ×2 IMPLANT
STOPCOCK IV 700 PSI 3 WAY HIGH PRESSURE (Other) ×1 IMPLANT
STOPCOCK IV 700 PSI 3 WAY HIGH PRESSURE MALE ROTATE LOCK OFF HANDLE (Other) ×1 IMPLANT
STOPCOCK PSI 700 (Other) ×1
SYRINGE 30 ML CONCENTRIC TIP GRADUATE (Syringes, Needles) ×1 IMPLANT
SYRINGE 30 ML CONCENTRIC TIP GRADUATE NONPYROGENIC DEHP FREE LOK (Syringes, Needles) ×1 IMPLANT
SYRINGE LUER LOCK WO SFTY 30ML (Syringes, Needles) ×1
TEGADERM CLR ACRYLLIC 4.5X6.8 (Dressing) ×2 IMPLANT
TOWEL L27 IN X W17 IN COTTON PREWASH (Other) ×1 IMPLANT
TOWEL L27 IN X W17 IN COTTON PREWASH DELINT HIGH ABSORBENT BLUE (Other) ×1 IMPLANT
TOWEL OR DISP 10PK (Other) ×1
TRAY SRG TAVR IFMC (Pack) ×1 IMPLANT
TRAY SURGICAL TAVR (Pack) ×1 IMPLANT
TRAY TAVR FFX (Pack) ×1
VALVE KIT SAPIEN 3 29MM (Valve) ×1 IMPLANT

## 2019-07-12 NOTE — Progress Notes (Addendum)
CV SURGERY PROGRESS NOTE     POD#: 1    Surgeon: Christene Lye, MD    5/26: TAVR 29mm Edwards Sapien S3  EF: 30%  OR: Rhythm: SB 50s; Issues: none.      Indication for surgery: Severe AS.  PMH: Severe AS, CAD s/p CABGx3 ('09, patent grafts), RBBB, HFrEF, HTN, HLD, mild cognitive impairment, Thrombocytopenia (Plt 100K), knee pain.     Key Events/Summary:    5/26: TAVR.     Assessment/Plan:       Plan:   Post op Echo looks good   D/c to home   Only on Plavix per Dr. Florina Ou      Cardiovascular:   Severe AS s/p TAVR 29mm Edwards Sapien S3   Current rhythm: SR with RBBB   Post-op echo: EF 35% (was 38% on 5/26); trivial perivalvular AI; no pericardial effusion   HFrEF- c/w medical management.    CAD s/p CABGx3: s/p LHC 05/2019 w/patent grafts- c/w medical management   HTN   HLD: preop on Crestor 10mg - resumed   Cardiologist: IMG, Dr. Penny Pia, Dr.Rajan.     Neurological/?:   Mild Cognitive Impairment: preop on Namenda- resumed   Pain control: standard regimen    Pulmonary:   Current oxygen requirements: 96% RA     Gastrointestinal:   Nutrition: tol po     Renal/GU:   Creatinine 1, Baseline creatinine: 1.4   I/O past 24 hours: -850/24hrs   Foley: none     Infectious Disease:   afebrile; WBC: 5   Lines: PIV    Hematological:   DAPT per TAVR Protocol- Plavix only per Dr. Florina Ou   Hematocrit: 38, Platelets: 88   DVT prophylaxis: SCDs and Heparin Ssq     Endocrinological:   Non-DM- SSI   HgbA1c: 4.6       Subjective:   Stable night        Objective:   Vital signs for last 24 hours:  Temp:  [97 F (36.1 C)-97.1 F (36.2 C)] 97.1 F (36.2 C)  Heart Rate:  [44-82] 47  Resp Rate:  [13-23] 15  BP: (99-169)/(52-98) 120/56       Intake/Output Summary (Last 24 hours) at 07/12/2019 1227  Last data filed at 07/12/2019 1103  Gross per 24 hour   Intake 600 ml   Output 100 ml   Net 500 ml       Neuro: intact, MAE  Cardiac: RRR  Lungs: CTA  Abdomen: soft, NTTP, NDT, +BS  Extremities: skin warm to touch; no  edema; palpable BLE pulses  Wounds: bilateral groins CDI with no bleeding or hematomas present        Invasive ICU Hemodynamics:          Vent Settings:  PEEP/EPAP:  [0 cm H20] 0 cm H20     No Known Allergies     Medications:  Scheduled Meds:  Current Facility-Administered Medications   Medication Dose Route Frequency     Continuous Infusions:   lactated ringers         Home Medications:   Prior to Admission medications    Medication Sig Start Date End Date Taking? Authorizing Provider   docusate sodium (COLACE) 100 MG capsule Take 100 mg by mouth 2 (two) times daily Take Am of TAVR   Yes [provider]   memantine (NAMENDA) 10 MG tablet Take 20 mg by mouth every evening None Am of TAVR   07/04/18  Yes [provider]  mupirocin (BACTROBAN) 2 % ointment Apply topically 2 (two) times daily Place pea sized amt in nostrils twice daily. Start 5 days prior to procedure. Last dose AM of procedure. 06/27/19  Yes Daleen Snook, NP   rosuvastatin (CRESTOR) 10 MG tablet Take 1 tablet (10 mg total) by mouth daily  Patient taking differently: Take 5 mg by mouth daily None Am of TAVR   04/03/19 04/02/20 Yes Fortino Sic, MD   aspirin EC 81 MG EC tablet Take 81 mg by mouth nightly None Am of TAVR      [provider]   Multiple Vitamin (multivitamin) capsule Take 1 capsule by mouth daily Stop 3 days prior to TAVR      [provider]   SUPPLEMENTS NOT FOUND IN DATABASE Take 1 tablet by mouth 2 (two) times daily Stop 3 days prior to TAVR   Super Beta plus    [provider]   vitamin D (CHOLECALCIFEROL) 25 MCG (1000 UT) tablet Take 2,000 Units by mouth daily Stop 3 days prior to TAVR    [provider]         Labs:  CBC:  Recent Labs     07/10/19  1422   WBC 4.14   Hgb 13.2   Hematocrit 41.0   Platelets 96*     BMP:  Recent Labs     07/10/19  1422   Sodium 142   Potassium 4.2   Chloride 108   CO2 24   BUN 22.0   Creatinine 1.4*   Glucose 68*   Calcium 9.1     LFTs:   Recent Labs      07/10/19  1422   AST (SGOT) 19   ALT 15   Alkaline Phosphatase 77   Bilirubin, Total 0.7   Protein, Total 6.9   Albumin 3.7     INR:   Recent Labs     07/10/19  1422   PT 13.4*   PT INR 1.2*     ABG: No results for input(s): PHART, PO2ART, PCO2ART, HCO3ART, BEART in the last 72 hours.        Fredric Dine, NP  Cardiovascular Surgery

## 2019-07-12 NOTE — Anesthesia Preprocedure Evaluation (Signed)
Anesthesia Evaluation    AIRWAY    Mallampati: III    TM distance: >3 FB  Neck ROM: full  Mouth Opening:full  Planned to use difficult airway equipment: No CARDIOVASCULAR    cardiovascular exam normal       DENTAL           PULMONARY    pulmonary exam normal     OTHER FINDINGS    81M w/ PMHx HTN/HLD, iCMPY w/ EF=30% visual, 23% by 2D, CAD s/p CABGx4 (2003) p/w severe AS for TAVR.  hct 41, plts 96, Cr 1.4  LHC 05/28/19: patent LIMA-LAD, VG-OM1, VG-OM2, VG-RCA; native vessels largely occluded.  TTE 04/04/19: EF=30%, severe AS (mAG=44mmHg, AVA 0.67, DI 0.15, PV=4.3), mild AI, mild-mod MR, mild mod TR, PASP                    Anesthesia Plan    ASA 4     general                     intravenous induction   Detailed anesthesia plan: general IV  Monitors/Adjuncts: arterial line          informed consent obtained      pertinent labs reviewed             Signed by: Trinna Balloon 07/12/19 9:17 AM

## 2019-07-12 NOTE — Progress Notes (Addendum)
1530: Pt HR dropped to 38. SBP 120s. PA aware. Will continue to monitor    1600: Pt off bedrest. HR in the 60s.

## 2019-07-12 NOTE — Progress Notes (Signed)
Johnna Acosta, PA notified of completion of 12 lead EKG. VSS. Pt to be transferred to floor per PA.

## 2019-07-12 NOTE — Transfer of Care (Signed)
Anesthesia Transfer of Care Note    Patient: Rick [name]    Procedures performed: Procedure(s):  REPLACEMENT, AORTIC VALVE , EDWARDS SAPIEN -- TAVR Valve:   S3 29mm -- Access: RTF -- IC: Dr. Penny Pia    Anesthesia type: General TIVA    Patient location:Phase I PACU    Last vitals:   Vitals:    07/12/19 0817   BP: 157/81   Pulse: 82   Resp: 19   Temp:    SpO2: 99%       Post pain: Patient not complaining of pain, continue current therapy      Mental Status:drowsy but arousable to voice    Respiratory Function: tolerating face mask    Cardiovascular: stable    Nausea/Vomiting: patient not complaining of nausea or vomiting    Hydration Status: adequate    Post assessment: no apparent anesthetic complications and no reportable events    Signed by: Orlene Plum  07/12/19 11:19 AM

## 2019-07-12 NOTE — Op Note (Signed)
BRIEF OP NOTE    Date Time: 07/12/19 11:36 AM    Patient Name:   Rick [name]    Date of Operation:   07/12/2019    Providers Performing:   Surgeon(s):  [name], MD  Dagmar Hait, MD  Arlyss Queen, MD PhD    Assistant (s):   Circulator: Lenda Kelp, RN  Perfusionist: Cheree Ditto, CCP  Scrub Person: Tonny Branch  Preceptor: Marena Chancy, RN  CV Tech: Langston Reusing  Second Scrub Person: Janey Greaser, RN    Operative Procedure:   Procedure(s):  REPLACEMENT, AORTIC VALVE , EDWARDS SAPIEN -- TAVR Valve:   S3 29mm -- Access: RTF -- IC: Dr. Penny Pia    Preoperative Diagnosis:   Pre-Op Diagnosis Codes:     * Non-rheumatic aortic stenosis [I35.0]    Postoperative Diagnosis:   Post-Op Diagnosis Codes:     * Non-rheumatic aortic stenosis [I35.0]    Anesthesia:   General    Estimated Blood Loss:    * No values recorded between 07/12/2019  7:37 AM and 07/12/2019 11:14 AM *    Implants:     Implant Name Type Inv. Item Serial No. Manufacturer Lot No. LRB No. Used Action   VALVE SAPIEN 3 TRNSCATH - [id number] Valve VALVE SAPIEN 3 TRNSCATH [id number] [employer]  N/A 1 Implanted       Drains:   none    Specimens:   * No specimens in log *      Findings:   Good valve position and function    Complications:   none      Signed by: Christene Lye, MD                                                                           [facility] HEART OR

## 2019-07-12 NOTE — Progress Notes (Addendum)
Pt received from PACU at 1400. Pt vital signs stable. Heart rate in the 40s-50s. Team aware. SBP stable - 120s. SpO2 97% on RA. Pt is on bed rest until 1515.    Skin intact. B/L groin sites present upon transfer. Groins are soft, non tender and CDI.    Belongings at bedside: Black bag with clothes, cane, glasses, and hat. No medication or electronics.

## 2019-07-12 NOTE — OR Nursing (Signed)
2130 - Patient and family seen in pre-op holding area.  Patient verified, name, date of birth, and surgical procedure being performed today.  H&P reviewed and all consents signed and present in patient's chart.  Patient acknowledges teachings with no further questions at this time.     8657 - Optifoam dressing applied to lumbosacral area.    1040 - Report given to [facility], PACU RN.

## 2019-07-13 ENCOUNTER — Encounter: Payer: Self-pay | Admitting: Thoracic Surgery (Cardiothoracic Vascular Surgery)

## 2019-07-13 ENCOUNTER — Inpatient Hospital Stay (HOSPITAL_COMMUNITY): Payer: Medicare Other

## 2019-07-13 DIAGNOSIS — I5040 Unspecified combined systolic (congestive) and diastolic (congestive) heart failure: Secondary | ICD-10-CM

## 2019-07-13 DIAGNOSIS — I5189 Other ill-defined heart diseases: Secondary | ICD-10-CM

## 2019-07-13 DIAGNOSIS — I517 Cardiomegaly: Secondary | ICD-10-CM

## 2019-07-13 DIAGNOSIS — I080 Rheumatic disorders of both mitral and aortic valves: Secondary | ICD-10-CM

## 2019-07-13 DIAGNOSIS — Z01810 Encounter for preprocedural cardiovascular examination: Secondary | ICD-10-CM

## 2019-07-13 DIAGNOSIS — Z9889 Other specified postprocedural states: Secondary | ICD-10-CM

## 2019-07-13 DIAGNOSIS — I350 Nonrheumatic aortic (valve) stenosis: Principal | ICD-10-CM

## 2019-07-13 LAB — ECG 12-LEAD
Atrial Rate: 75 {beats}/min
P Axis: 58 degrees
P-R Interval: 178 ms
Q-T Interval: 446 ms
QRS Duration: 146 ms
QTC Calculation (Bezet): 498 ms
R Axis: -31 degrees
T Axis: 38 degrees
Ventricular Rate: 75 {beats}/min

## 2019-07-13 LAB — ECHOCARDIOGRAM ADULT COMPLETE W CLR/ DOPP WAVEFORM
AV Area (Cont Eq VTI): 2.26
AV Area (Cont Eq VTI): 2.263
AV Mean Gradient: 9
AV Peak Velocity: 192
Ao Root Diameter (2D): 3.1
BP Mod LV Ejection Fraction: 34.7
IVS Diastolic Thickness (2D): 1.28
LA Dimension (2D): 4.7
LA Volume Index (BP A-L): 0.053
LVID diastole (2D): 6
LVID systole (2D): 4.94
MV Area (PHT): 4.626
MV E/A: 1.652
MV E/A: 1.7
MV E/e' (Average): 12.23
Prox Ascending Aorta Diameter: 3.4
Pulmonary Valve Findings: NORMAL
RV Basal Diastolic Dimension: 4.77
RV Systolic Pressure: 45.772
TAPSE: 1.17
Tricuspid Valve Findings: NORMAL

## 2019-07-13 LAB — CBC
Absolute NRBC: 0 10*3/uL (ref 0.00–0.00)
Hematocrit: 38.4 % (ref 37.6–49.6)
Hgb: 12.6 g/dL (ref 12.5–17.1)
MCH: 33.7 pg — ABNORMAL HIGH (ref 25.1–33.5)
MCHC: 32.8 g/dL (ref 31.5–35.8)
MCV: 102.7 fL — ABNORMAL HIGH (ref 78.0–96.0)
MPV: 11.7 fL (ref 8.9–12.5)
Nucleated RBC: 0 /100 WBC (ref 0.0–0.0)
Platelets: 88 10*3/uL — ABNORMAL LOW (ref 142–346)
RBC: 3.74 10*6/uL — ABNORMAL LOW (ref 4.20–5.90)
RDW: 13 % (ref 11–15)
WBC: 5.43 10*3/uL (ref 3.10–9.50)

## 2019-07-13 LAB — BASIC METABOLIC PANEL
Anion Gap: 9 (ref 5.0–15.0)
BUN: 17 mg/dL (ref 9.0–28.0)
CO2: 21 mEq/L — ABNORMAL LOW (ref 22–29)
Calcium: 8.7 mg/dL (ref 7.9–10.2)
Chloride: 108 mEq/L (ref 100–111)
Creatinine: 1 mg/dL (ref 0.7–1.3)
Glucose: 89 mg/dL (ref 70–100)
Potassium: 4.4 mEq/L (ref 3.5–5.1)
Sodium: 138 mEq/L (ref 136–145)

## 2019-07-13 LAB — GFR: EGFR: >60

## 2019-07-13 LAB — MAGNESIUM: Magnesium: 1.7 mg/dL (ref 1.6–2.6)

## 2019-07-13 MED ORDER — MAGNESIUM SULFATE IN D5W 1-5 GM/100ML-% IV SOLN
1.0000 g | INTRAVENOUS | Status: AC
Start: 2019-07-13 — End: 2019-07-13
  Administered 2019-07-13 (×2): 1 g via INTRAVENOUS
  Filled 2019-07-13 (×2): qty 100

## 2019-07-13 MED ORDER — CLOPIDOGREL BISULFATE 75 MG PO TABS
75.0000 mg | ORAL_TABLET | ORAL | 3 refills | Status: DC
Start: 2019-07-14 — End: 2020-05-01

## 2019-07-13 MED ORDER — ACETAMINOPHEN 325 MG PO TABS
650.0000 mg | ORAL_TABLET | ORAL | Status: DC | PRN
Start: 2019-07-13 — End: 2022-02-17

## 2019-07-13 NOTE — Plan of Care (Signed)
Problem: Moderate/High Fall Risk Score >5  Goal: Patient will remain free of falls  Outcome: Progressing  Flowsheets (Taken 07/13/2019 0247)  High (Greater than 13):   HIGH-Apply yellow "Fall Risk" arm band   HIGH-Consider use of low bed  VH Moderate Risk (6-13):   Use of "STOP ask for help" sign   Use of floor mat  VH High Risk (Greater than 13): Use of floor mat     Problem: Safety  Goal: Patient will be free from injury during hospitalization  Outcome: Progressing  Flowsheets (Taken 07/13/2019 0247)  Patient will be free from injury during hospitalization: Assess patient's risk for falls and implement fall prevention plan of care per policy     Problem: Pain  Goal: Pain at adequate level as identified by patient  Outcome: Progressing  Flowsheets (Taken 07/13/2019 0247)  Pain at adequate level as identified by patient: Identify patient comfort function goal     Problem: Psychosocial and Spiritual Needs  Goal: Demonstrates ability to cope with hospitalization/illness  Outcome: Progressing  Flowsheets (Taken 07/13/2019 0247)  Demonstrates ability to cope with hospitalizations/illness:   Encourage verbalization of feelings/concerns/expectations   Provide quiet environment

## 2019-07-13 NOTE — Discharge Instr - AVS First Page (Addendum)
SEE ACCOMPANYING PINK FORM IN DISCHARGE PACKET - [facility] STRUCT[facility] HEART DISCHARGE INSTRUCTIONS                                     DISCHARGE INSTRUCTIONS FOR STRUCTURAL HEART PATIENTS     Return Appointments:    You should have a follow up appointment made for you with the [facility] in 3-5 days after discharge.  If you do not have an appointment, call the TAVR clinic at [phone] to schedule an appointment.  You will be getting a chest X-ray at your follow up appointment.  This will be scheduled through the TAVR clinic.   Call your cardiologist to make an appointment in 3-4 weeks.  Call your primary care doctor to schedule an appointment within one month.    Diet:    Cardiac diet, or consistent carbohydrate diet if you are a diabetic.    Medications:    Take only the medications listed on your discharge instructions.    Unless otherwise specified, you will be on Plavix for 3 months, and aspirin for life.  If you have had a MitraClip placed, you may be on Plavix for less than 3 months.  Discuss at your follow up appointment.  Bring all of your medications given to you at discharge to your follow up appointments with the [facility], your cardiologist, and your primary care doctor.    Medications will likely be changed over time.  All refills must be done by your primary cardiologist.  If you have any questions regarding your medications, call the [facility]    Activity:    Activity is as tolerated.    Get up and get dressed each day.  It is OK to climb stairs.  Increase activity gradually until you are back at your baseline.    You may resume sexual activity when cleared by the [facility].  Do not drive until cleared by the [facility].  You will be given the phone number for cardiac rehab, which starts approximately 4 weeks after surgery. A referral form will be given to you at your 30 day post op appointment.  Record daily weights.  No  lifting over 5 - 10 pounds until you are cleared by the [facility].  You will need someone who can drive to stay with you until your first postoperative appointment.   If you are still smoking, you need to stop.  If you need help quitting, contact your primary care doctor.    Incision care:     Shower each day.  Wash each incision with a clean washcloth using a mild liquid body wash (such as Dove Sensitive Skin body wash) and warm water.  Avoid vigorous scrubbing.    Check incisions daily for signs of infection such as increased warmth, redness, or drainage.    No tub baths until incisions are healed (approximately 2 weeks).  You will likely have incisions in your groin area.  Pay close attention to these incisions and call for any signs of infection.  Leave incisions open to air.  Do not apply any oils, creams, lotions, or powder to the incision unless instructed by your physician.      Self Care:    Keep legs elevated when possible, and not engaging in physical activity.   Use your incentive spirometer 10 times per hour while awake,   for the first 2 weeks after surgery.    Take and record temperature daily for the first 2 weeks.  Take and record weight every morning for the first 2 weeks.    Take and record your blood pressure daily for the first 2 weeks.   Let your dentist know that you have a prosthetic heart valve.  You may need antibiotics prior to dental procedures.  We recommend following the AHA guidelines regarding endocarditis prophylaxis.    Call the TAVR clinic ([phone]) if you experience any of the following:  (After hours or weekends, call [facility] at [phone])    Pulse less than 50 or greater than 120 beats per minute  Irregular or fluttering heartbeat  Temperature greater than 101, chills or uncontrolled shakes  Chest pain (other than incisional pain)  Incision sites that have become more painful, swollen, red, warm to the touch, or that persistently  drain or bleed.  Shortness of breath at rest, or more short of breath than your baseline or sharp pain when taking in a deep breath.  Weight gain more than 3 pounds in a day or 5 pounds in a week  Weakness in your face, arms or legs  Difficulty speaking, visual changes, severe headache, passing out or nearly passing out  Severe abdominal pain, blood in your stool, or dark/tarry stools  Blood in your urine  Gout flare up  Rash  Calf pain    STROKE, WHAT YOU NEED TO KNOW --Remember F.A.S.T  What is a stroke? A stroke happens when blood flow to part of the brain is interrupted. This can cause serious brain damage from a lack of oxygen. Brain function may be affected depending on where the stroke happens. A stroke caused by a blood clot is called an ischemic stroke. Ischemic stroke is the most common type. A stroke caused by a burst or torn blood vessel is called a hemorrhagic stroke. When stroke symptoms go away completely within minutes to hours and do not cause damage, it is called a transient ischemic attack (TIA). A TIA is a warning sign that you are at risk of soon having a stroke.  What are the warning signs of a stroke? The word F.A.S.T. can help you remember and recognize warning signs of a stroke.   F = Face: One side of the face droops.    A = Arms: One arm starts to drop when both arms are raised.    S = Speech: Speech is slurred or sounds different than usual.    T = Time: A person who is having a stroke needs to be seen immediately. A stroke is a medical emergency that needs immediate treatment. Most medicines and treatments work best the sooner they are given.  What are the signs and symptoms of a stroke? The signs and symptoms will depend on the type of stroke and where it occurred:   Sudden weakness in your arm, leg, or face    Sudden trouble walking    Trouble speaking or understanding words you read or hear     Vision changes    Sudden inability to feel part of your body or limbs    Loss of  consciousness    Vomiting or a severe headache

## 2019-07-13 NOTE — Plan of Care (Signed)
Problem: Safety  Goal: Patient will be free from injury during hospitalization  Outcome: Progressing  Flowsheets (Taken 07/13/2019 1107)  Patient will be free from injury during hospitalization:   Assess patient's risk for falls and implement fall prevention plan of care per policy   Provide and maintain safe environment   Use appropriate transfer methods   Ensure appropriate safety devices are available at the bedside   Include patient/ family/ care giver in decisions related to safety   Hourly rounding   Provide alternative method of communication if needed (communication boards, writing)  Goal: Patient will be free from infection during hospitalization  Outcome: Progressing  Flowsheets (Taken 07/13/2019 1107)  Free from Infection during hospitalization:   Assess and monitor for signs and symptoms of infection   Monitor lab/diagnostic results   Monitor all insertion sites (i.e. indwelling lines, tubes, urinary catheters, and drains)   Encourage patient and family to use good hand hygiene technique     Problem: Pain  Goal: Pain at adequate level as identified by patient  Outcome: Progressing  Flowsheets (Taken 07/13/2019 1107)  Pain at adequate level as identified by patient:   Identify patient comfort function goal   Assess for risk of opioid induced respiratory depression, including snoring/sleep apnea. Alert healthcare team of risk factors identified.   Assess pain on admission, during daily assessment and/or before any "as needed" intervention(s)   Reassess pain within 30-60 minutes of any procedure/intervention, per Pain Assessment, Intervention, Reassessment (AIR) Cycle   Evaluate if patient comfort function goal is met   Evaluate patient's satisfaction with pain management progress   Offer non-pharmacological pain management interventions

## 2019-07-13 NOTE — Progress Notes (Signed)
Reviewed AVS (after-care summery), medications, vital signs log and follow-up appointments with patient and patient's son. Peripheral IV's removed by tech. Discontinued telemetry order. Patient discharge with son to home.

## 2019-07-13 NOTE — Progress Notes (Signed)
TAVR Eval    RN-EB  NP-YZ    BMI=23   Requests open repair if needed during TAVR      TAVR Date-07/12/19- pt son aware.   DEVICE&ACCESS--Consensus-07/06/19 S3 #29 RTF   MENSIO- workup for Pt: T.D. Minette [place]).  ValvePoint ID: [id number]; images uploaded 07/07/19.  Appears suited for a small 29S3 at 18% oversized.  Annulus = 549.30mm2   LVOT = 597.31mm2   SOV = 34/34/33  STJ/Height = 30.0 at 24.34mm  LCA/RCA = 13.6/17.7  Angle = LAO 13   CAUD 6  TF access looks ok on either side. Bifurcations are at the top of both femoral heads. Minimal calcification noted.  PEC Labs/UA/Covid-07/10/19 - reviewed by YZ    Cardiology-OV-Dr. Damluji -04/26/19  Surgeon-VC-Dr. Garen Grams 06/28/19    EKG-07/10/19 RSR PVC's    PRI=182  QRS=150  CXR-07/10/19 Mild cardiomegaly. Clear lungs  Echo-04/04/19 AVA=0.6 PV= MG=38 EF=30%  AI= MR/TR=Mild-Mod   Biatrial enlargement  Cath-05/28/19 Severe 3V CAD.  Patent LIMA to LAD, patent SVG to OM 1 and OM 2 of circumflex CA, & patent SVG to RCA.  CTA- 06/16/19 Peri=83  Area=544 Angle=48 LFA=6.5 RFA=7.5 Ca Score=1992     Labs-05/17/19 H/H/PLT=12/39/101  CR/GFR=1.3/>60     Nursing:  [facility] orders-07/06/19  [facility]--[facility] -[facility]--5MW-Teaching Packet-06/28/19  [facility] prescription- Med Cessation list- 06/28/19  TAVR CTA order- given to SSB 06/14/19     Optional Testing:  Carotid-Spirometry-Dental Clearance-N/A

## 2019-07-13 NOTE — Progress Notes (Signed)
[place] CARDIOLOGY PROGRESS NOTE     Date Time:    07/13/19    9:23 AM  Patient Name: Rick [name]    LOS: 1 day     Subjective:  Status post 29 mm S3 Edwards SAPIEN TAVR procedure from yesterday.  No overnight cardiac events.  Has baseline cognitive impairment, unchanged.  No significant delirium.    Ambulating this morning.  Remains in sinus rhythm.  No chest discomfort, orthopnea, PND, palpitations or syncope.    Scheduled Meds:   clopidogrel, 75 mg, Oral, Q24H SCH  heparin (porcine), 5,000 Units, Subcutaneous, Q8H SCH  magnesium sulfate, 1 g, Intravenous, Q1H  memantine, 20 mg, Oral, Daily  rosuvastatin, 10 mg, Oral, QHS  senna-docusate, 1 tablet, Oral, QHS     Continuous Infusions:    lactated ringers       PRN Meds: acetaminophen, bisacodyl, ondansetron **OR** ondansetron, traMADol    Objective:  Physical Exam:  BP 140/67    Pulse 89    Temp 98 F (36.7 C) (Oral)    Resp 18    Ht 1.702 m (5\' 7" )    Wt 69.2 kg (152 lb 9.6 oz)    SpO2 96%    BMI 23.90 kg/m      Intake/Output Summary (Last 24 hours) at 07/13/2019 0923  Last data filed at 07/13/2019 0739  Gross per 24 hour   Intake 800 ml   Output 1650 ml   Net -850 ml      Wt Readings from Last 1 Encounters:   07/13/19 0552 69.2 kg (152 lb 9.6 oz)   07/12/19 0800 69.4 kg (153 lb)     General: awake, alert, oriented x 3, no acute distress.  Cardiovascular:regular rate and rhythm, no murmurs, rubs or gallops.  Groin sites look stable.  Typical bruising.  No major hematoma.     Lungs: clear to auscultation bilaterally, without wheezing, rhonchi, or rales  Abdomen: soft, non-tender, non-distended, no hepatosplenomegaly, normoactive bowel sounds  Extremities: no clubbing, cyanosis, or edema     Lab Review:   Recent Labs     07/13/19  0229 07/10/19  1422   WBC 5.43 4.14   Hgb 12.6 13.2   Hematocrit 38.4 41.0   Platelets 88* 96*     No results for input(s): TROPI, ISTATTROPONI, CK, CKMB, BNP, DDIMER in the last 72 hours. Recent Labs     07/13/19  0229  07/10/19  1422   Sodium 138 142   Potassium 4.4 4.2   CO2 21* 24   BUN 17.0 22.0   Creatinine 1.0 1.4*   Glucose 89 68*   Magnesium 1.7  --     Estimated Creatinine Clearance: 44.1 mL/min (based on SCr of 1 mg/dL).   Recent Labs     07/10/19  1422   PT INR 1.2*        Telemetry: Sinus rhythm     Imaging:  Radiology Results (24 Hour)     ** No results found for the last 24 hours. **          Assessment/Plan:   1.  Severe aortic stenosis, status post TAVR with 29 mm Edwards SAPIEN S3 valve.  Stable.  2.  Advanced age.  3.  Chronic mild thrombocytopenia, platelets 88 today.  4.  CKD, creatinine normal today.  5.  History of HFpEF.  Likely secondary to aortic valve disease.  6.  Treated hyperlipidemia.    In summary, he appears to be stable post TAVR.  We will ambulate today and complete an echo.  If stable beyond that, can likely be discharged today.  Can arrange follow-up visit within 1 week in the valve clinic and 30-day echo/visit followed by continued follow-up through the patient's cardiologist Dr. Glean Hess.     With his thrombocytopenia and advanced age, he is at risk of bleeding on DAPT.  Given that, we will continue with Plavix only and withhold adding aspirin.  Data thus far suggests that there is very little difference in outcomes with single versus dual antiplatelet therapy post TAVR and risk of bleeding is higher.       Signed by: Dagmar Hait, MD, [facility]  [facility]   [facility] NP Spectralink [phone] or [phone] (M-F 8 am-5 pm)   [facility] MD Spectralink [phone] (M-F 8 am-5 pm)   Tyson Babinski [facility] Spectralink [phone]  (M-F 8 am-5 pm)  After Hours Consult Line: [phone]

## 2019-07-13 NOTE — Procedures (Signed)
INTERVENTIONAL CARDIOLOGY TAVR REPORT    PROCEDURE:    Transfemoral transcatheter aortic valve replacement with a 29mm Edwards Sapiens S3 valve.    PRIMARY IC OPERATORS  Julianne Rice, MD, MHS  Esperanza Richters, MD, PhD    PRIMARY CT SURGEON  Clarene Duke, MD    EBL: 50 cc    CONTRAST: 50cc    VASCULAR ACCESS:  Large sheath: 89F right femoral artery  Pigtail catheter: 4F left femoral artery  Temporary pacer access: 6 F left femoral vein    INDICATIONS: Severe symptomatic aortic valve stenosis.    DESCRIPTION OF PROCEDURE:  Prior to the procedure, informed consent was obtained. The patient was prepped and draped in the usual fashion. Anesthesiology provided conscious sedation for the procedure.  1% lidocaine was used to infiltrate local anesthetic over the right and left femoral access sites.    The the left common femoral artery was accessed using a micropuncture technique and with direct visualization with ultrasound.  A 5 French sheath was placed.  The left femoral vein was accessed in a similar fashion with a 6 French sheath placed.  The right femoral artery was then accessed using the same micropuncture technique and with ultrasound guidance.  2 Perclose sutures were used to preclose the access site.  The sheath was then upsized to a 14 [place] sheath.  Patient was heparinized keeping the ACT over 250s.  A temporary pacemaker was placed up from the venous access to the RV with thresholds confirmed at 1 mA.  A pigtail catheter was advanced through the 5 [place] sheath.  The coplanar view was identified. The aortic valve was crossed with a combination of an AL1 catheter and a Newton wire with subsequent exchange for a pigtail catheter.  The mean LV/aortic pressure gradient was measured at . An extra support Amplatz wire with a shaped tip was placed in the LV.       A  29mm Edwards Sapiens S3 valve was placed across the annulus deployed while pacing at 180 bpm.. Final deployment was 80% aortic, 20% LV.  The mean  gradient was reduced to< and there was only trace aortic regurgitation noted on aortography.  Transthoracic echo was performed post valve deployment confirming the same.    The large-bore access site was then closed percutaneously by deploying the 2 Perclose sutures with good hemostasis.  A final nonselective iliofemoral angiogram confirmed no evidence of frank vessel perforation, dissection or significant stenosis at the access site.      There was no evidence of advanced heart block requiring continued temporary or permanent pacemaker.    The patient tolerated the procedure with no apparent complications.    CONCLUSIONS  1. Successful TAVR with 29 mm Edwards Sapien S3 valve.  2. No heart block noted during procedure.    PLAN  1. Routine poist-procedure care and recovery.  2. Consider discharge if stable in next 24-48 hours.

## 2019-07-13 NOTE — Anesthesia Postprocedure Evaluation (Signed)
Anesthesia Post Evaluation    Patient: Rick [name]    Procedure(s):  REPLACEMENT, AORTIC VALVE , EDWARDS SAPIEN -- TAVR Valve:   S3 29mm -- Access: RTF -- IC: Dr. Penny Pia    Anesthesia type: general    Last Vitals:   Vitals Value Taken Time   BP 133/63 07/12/19 1350   Temp 36.2 C (97.1 F) 07/12/19 1350   Pulse 55 07/12/19 1350   Resp 18 07/12/19 1350   SpO2 97 % 07/12/19 1350                 Anesthesia Post Evaluation:     Patient Evaluated: bedside    Level of Consciousness: awake  Pain Score: 0  Pain Management: adequate    Airway Patency: patent    Anesthetic complications: No      PONV Status: none    Cardiovascular status: acceptable and stable  Respiratory status: acceptable and room air  Hydration status: acceptable        Signed by: Evans Lance, 07/13/2019 10:31 AM

## 2019-07-13 NOTE — Progress Notes (Signed)
Pt seen with son Bion for initial post procedure f/u   Follow up testing performed.

## 2019-07-13 NOTE — Progress Notes (Signed)
Case Management   Discharge Checklist    Discharge Plan:   Jul 13, 2019   Rick [name]     Discharge Destination: Son's house - Son & daughter-in-law will help at home.    HH Services: None     Transport plan: Son            07/13/19 1427   Discharge Disposition   Patient preference/choice provided? Yes   Physical Discharge Disposition Home   Mode of Transportation Car  (Son)   Patient/Family/POA notified of transfer plan Yes   Patient agreeable to discharge plan/expected d/c date? Yes   Family/POA agreeable to discharge plan/expected d/c date? Yes   Bedside nurse notified of transport plan? Yes   Medicare Checklist   Is this a Medicare patient? Yes   If LOS 3 days or greater, did patient received 2nd IMM Letter?   (Did not stay foe 3 nights)     Harbor Paster Rosezella Rumpf  CVSD RN Case Manager  [facility]  Case Management Department  [phone]

## 2019-07-13 NOTE — Discharge Summary (Signed)
DISCHARGE NOTE    Date Time: 07/13/19 1:14 PM  Patient Name: Rick [name]  Attending Physician: Christene Lye, MD    Date of Admission:   07/12/2019    Date of Discharge:   07/13/2019    Reason for Admission:   Aortic valve stenosis, etiology of cardiac valve disease unspecified [I35.0]    Discharge Dx:   Aortic valve stenosis s/p TAVR    Problems:   Lists the present on admission hospital problems  Present on Admission:   Aortic valve stenosis, etiology of cardiac valve disease unspecified      Past Medical History     Past Medical History:   Diagnosis Date    Atrial enlargement, bilateral Echo result 04/04/19    CAD (coronary artery disease) 05/28/19 Cath results.    Severe 3V CAD.  Patent LIMA to LAD, patent SVG to OM 1 and OM 2 of circumflex CA, & patent SVG to RCA.    Chronic combined systolic and diastolic heart failure 310/21 OV Dr. Penny Pia    EF=30%    Dementia Mar 2016    Difficulty walking May 2018    Post surgery of lt knee, rt femur    Dyslipidemia 310/21 OV Dr. Penny Pia    Dyspnea on exertion Jan 2020    HTN (hypertension) 310/21 OV Dr. Penny Pia    Mild cognitive impairment 310/21 OV Dr. Penny Pia    Primary cardiomyopathy Oct 2019    S/P CABG (coronary artery bypass graft)      LIMA to LAD,  SVG to OM 1 & OM 2 of circumflex CA, & SVG to RCA.           Procedures performed:   Transcatheter Aortic Valve Replacement using a Edward Sapien S3 valve via transfemoral approach by Dr. Garen Grams, on 07/12/2019.       Hospital Course:   The patient tolerated the procedure with no complications and was then transferred to the [facility] for close clinical and telemetric monitoring. Neurologically, the patient remained intact, without focal deficits. Hemodynamics were closely monitored as the patient remained in SR rhythm. Antihypertensives were restarted and optimized, avoiding beta blockers in the immediate perioperative setting. Surveillance postoperative echocardiogram was completed and revealed an  LVEF 35%, a well seated, normal functioning bioprosthetic aortic valve with trivial perivalvular leak or AI, and no  hemodynamically signficicant pericardial effusion.  Plavix per TAVR protocol was initiated per Dr. Florina Ou    The bleeding risks and potential complications of these medications were discussed. Hematocrit on discharge was 38. Platelet count was 88k. Given mild volume overload, gentle diuresis was provided with close monitoring of the patient's kidney function and electrolytes. Creatinine on discharge was 1 Home medications were resumed as noted below.     On post operative day 1, the patient was felt to be medically stable for discharge to home .    Discharge Medications:     Current Discharge Medication List      START taking these medications    Details   acetaminophen (TYLENOL) 325 MG tablet Take 2 tablets (650 mg total) by mouth every 4 (four) hours as needed for Pain      clopidogrel (PLAVIX) 75 mg tablet Take 1 tablet (75 mg total) by mouth every 24 hours  Qty: 30 tablet, Refills: 3         CONTINUE these medications which have NOT CHANGED    Details   docusate sodium (COLACE) 100 MG capsule Take 100 mg by mouth 2 (  two) times daily Take Am of TAVR      memantine (NAMENDA) 10 MG tablet Take 20 mg by mouth every evening None Am of TAVR        rosuvastatin (CRESTOR) 10 MG tablet Take 1 tablet (10 mg total) by mouth daily  Qty: 90 tablet, Refills: 3    Associated Diagnoses: Premature ventricular contractions (PVCs) (VPCs)      Multiple Vitamin (multivitamin) capsule Take 1 capsule by mouth daily Stop 3 days prior to TAVR        SUPPLEMENTS NOT FOUND IN DATABASE Take 1 tablet by mouth 2 (two) times daily Stop 3 days prior to TAVR   Super Beta plus      vitamin D (CHOLECALCIFEROL) 25 MCG (1000 UT) tablet Take 2,000 Units by mouth daily Stop 3 days prior to TAVR         STOP taking these medications       mupirocin (BACTROBAN) 2 % ointment        aspirin EC 81 MG EC tablet                Discharge  Instructions:   1. As per AVS.  2. Follow up with TAVR clinic as directed.  3. Instructions for additional follow up will be discussed by the TAVR clinic at the next office visit.        Echo Results     Procedure Component Value Units Date/Time    Echocardiogram Adlt Ltd W Clr & Dopp Ltd [[id number]] Collected: 07/12/19 1030     Updated: 07/12/19 1453     AV Mean Gradient 4     BP Mod LV Ejection Fraction 38.4     LVID diastole (2D) 5.7     LVID systole (2D) 4.7     IVS Diastolic Thickness (2D) 1.3     AV Peak Velocity 131     AV Area (Cont Eq VTI) 2.6     AV Area (Cont Eq VTI) 2.599     MV Regurgitation Severity no     TV Regurgitation Severity no     AV Regurgitation Severity no     PV Regurgitation Severity trace to mild     Aortic Valve Findings There is a well-seated 29 mmS3TAVR aortic valve replacement present at the aortic annulus with normal gradients for valve type (no stenosis). The mean gradient is  4 mmHg. There is trace perivalvular leak at 12 o'clock.     Aortic Valve Findings The dimensionless index is  0.51.     Aortic Valve Findings The mean aortic valve gradient is 4 mmHg.     Pulmonary Valve Findings There istrace to mild pulmonic regurgitation.     Pulmonary Valve Findings The pulmonic valve is not well visualized.     Mitral Valve Findings There is mild central mitral regurgitation.     Mitral Valve Findings There is non-specific thickening of the mitral leaflets.     Mitral Valve Findings There is mild mitral annular calcification.     Tricuspid Valve Findings The tricuspid valve is not well imaged.    Narrative:      Name:     Rick [name] [facility]  Age:     86 years  DOB:     February 24, 1924  Gender:     Male  MRN:     [id number]  Wt:     153 lb  BSA:     1.82 m2  Systolic BP:     324  mmHg  Diastolic BP:     67 mmHg  Technical Quality:     Adequate  Exam Date/Time:     07/12/2019 10:30 AM  Exam Type:     ECHOCARDIOGRAM ADULT LIMITED W CLR \\T \ DOPP WAVFRM LTD    Staff  Sonographer:     Tonette Lederer RDCS  Ordering Physician:     Leveda Anna    Study Info  Indications       - TAVR  Procedure    Limited two-dimensional, color flow Doppler, spectral Doppler transthoracic  echocardiogram performed.    67 in      History/Risk Factors    Prior Cardiac Studies  Date of Prior Echo:     2019/04/15      Summary    * Limited echo intra-operatively for TAVR.    * Left ventricular systolic function is moderately decreased with an  ejection fraction by Biplane Method of Discs of  38 %.    * Right ventricular function is normal by visual estimation.    * There is a well-seated 29 mm S3 TAVR aortic valve replacement present at  the aortic annulus with normal gradients for valve type (no stenosis). The  mean gradient is  4 mmHg. There is trace perivalvular leak at 12 o'clock.    * There is mild central mitral regurgitation.    * Compared to the prior study dated 2019/04/15, there is a TAVR present now  and the severe aortic stenosis no longer exists.      Findings  Left Ventricle    The left ventricle is normal in size.    Left ventricular wall thickness is mildly increased.    Left ventricular systolic function is moderately decreased with an ejection  fraction by Biplane Method of Discs of 38 %.    Moderate left ventricular global hypokinesis.  Right Ventricle    The right ventricular cavity size is normal by visual estimation.    Right ventricular function is normal by visual estimation.      Left Atrium    The left atrium is normal in size by visual estimation.    Right Atrium    The right atrium is normal in size by visual estimation.      Aortic Valve    There is a well-seated 29 mm S3 TAVR aortic valve replacement present at the  aortic annulus with normal gradients for valve type (no stenosis). The mean  gradient is 4 mmHg. There is trace perivalvular leak at 12 o'clock.    The mean aortic valve gradient is 4 mmHg.    The dimensionless index is 0.51.    Pulmonary Valve    The pulmonic valve is not well  visualized.    There is trace to mild pulmonic regurgitation.    Mitral Valve    There is non-specific thickening of the mitral leaflets.    There is mild mitral annular calcification.    There is mild central mitral regurgitation.    Tricuspid Valve    The tricuspid valve is not well imaged.      Pericardium / Pleural Effusion    No pericardial effusion visualized.      Measurements  2D Measurements  ----------------------------------------------------------------------  Name                                 Value        Normal  ----------------------------------------------------------------------  Parasternal 2D  ----------------------------------------------------------------------  IVS Diastolic Thickness (2D)       7.42 cm     0.60-1.00   LVID Diastole (2D)                 5.70 cm     4.20-5.80   LVIW Diastolic Thickness  (2D)                               1.30 cm     0.60-1.05   LVID Systole (2D)                  4.70 cm     2.50-4.00   LVOT Diameter                      2.50 cm                   LV Ejection Fraction 2D  ----------------------------------------------------------------------  LV EF (BP MOD)                        38 %         52-72  LVOT/Aortic Valve Doppler Measurements  ----------------------------------------------------------------------  Name                                 Value        Normal  ----------------------------------------------------------------------    LVOT Doppler  ----------------------------------------------------------------------  LVOT Peak Velocity                0.67 m/s                   AoV Doppler  ----------------------------------------------------------------------  AV Peak Velocity                  1.31 m/s                 AV Mean Gradient                    4 mmHg           <20   AV Area (Cont Eq VTI)             2.60 cm2        >=3.00   AV V1/V2 Ratio                        0.51      Report Signatures  Finalized by Venia Carbon  MD on 07/12/2019 02:52  PM  Promoted by Georgena Spurling on 07/12/2019 11:57 AM  Promoted by Tonette Lederer on 07/12/2019 11:26 AM    Echocardiogram Adult Complete W Clr/ Dopp Waveform [[id number]] Collected: 07/13/19 0856     Updated: 07/13/19 1031     RV Basal Diastolic Dimension 4.77     AV Mean Gradient 9     BP Mod LV Ejection Fraction 34.7     Prox Ascending Aorta Diameter 3.4     LVID diastole (2D) 6     LVID systole (2D) 4.94     IVS Diastolic Thickness (2D) 1.28     Ao Root Diameter (2D) 3.1     LA Dimension (2D) 4.7     AV Peak Velocity 192     TAPSE 1.17  MV E/A 1.7     MV E/A 1.652     AV Area (Cont Eq VTI) 2.26     AV Area (Cont Eq VTI) 2.263     MV Area (PHT) 4.626     LA Volume Index (BP A-L) 0.053     MV E/e' (Average) 12.23     RV Systolic Pressure 45.772     MV Regurgitation Severity mild to moderate     TV Regurgitation Severity mild to moderate     AV Regurgitation Severity no     PV Regurgitation Severity trace to mild     Aortic Valve Findings TAVR 29 mm SAPIEN 3 bioprosthetic valve in aortic position with no impingement on anterior mitral valve leaflet.     Aortic Valve Findings No prosthesis stenosis -peak velocity 192 cm/s, mean gradient 9 mmHg,aortic valve area 2.12 cm, and an NDSI of  0.56.     Aortic Valve Findings There istrivial perivalvular aortic regurgitation.     Pulmonary Valve Findings There istrace to mild pulmonic regurgitation.     Pulmonary Valve Findings The pulmonic valve is structurally normal     Mitral Valve Findings There is mild mitral annular calcification.     Mitral Valve Findings There is mild to moderate mitral regurgitation     Mitral Valve Findings The mitral valve is moderately calcified.     Tricuspid Valve Findings Moderate pulmonary hypertension withestimated right ventricular systolic pressureof  46 mmHg.     Tricuspid Valve Findings There is mild to moderatetricuspidregurgitation     Tricuspid Valve Findings The tricuspid valve is structurally normal.    Narrative:       Name:     Rick [name]  Age:     16 years  DOB:     1924/08/21  Gender:     Male  MRN:     [id number]  Wt:     153 lb  BSA:     1.82 m2  Systolic BP:     086 mmHg  Diastolic BP:     67 mmHg  Technical Quality:     Adequate  Exam Date/Time:     07/13/2019 8:56 AM  Exam Type:     ECHOCARDIOGRAM ADULT COMPLETE W CLR/ DOPP WAVEFORM    Staff  Sonographer:     Dava Najjar, RDCS  Ordering Physician:     Garen Grams, ERIC L    Study Info  Indications       - TAVR Protocol  Procedure    Complete two-dimensional, color flow and spectral Doppler transthoracic  echocardiogram is performed.    67 in      History/Risk Factors  Coronary Artery Disease     Yes    Prior Cardiac Studies  Date of Prior Echo:     07-29-2019      Summary    * The left ventricle is mildly dilated.    * Left ventricular mass index is severely increased.    * Left ventricular systolic function is moderately decreased with an  ejection fraction by Biplane Method of Discs of  35 %.    * Moderate global hypokinesis without regional variation.    * Grade II diastolic dysfunction (pseudonormal pattern) with elevated left  atrial pressure.    * The right ventricular cavity size is mildly dilated.    * Mildly decreased right ventricular systolic function.    * The left atrium is severely dilated.    * The right atrium is  mildly to moderately dilated.    * TAVR 29 mm SAPIEN 3 bioprosthetic valve in aortic position with no  impingement on anterior mitral valve leaflet.    * No prosthesis stenosis -peak velocity 192 cm/s, mean gradient 9 mmHg,  aortic valve area 2.12 cm2, and an NDSI of  0.56.    * There is trivial perivalvular aortic regurgitation.    * There is mild mitral annular calcification.    * There is mild to moderate mitral regurgitation.    * There is mild to moderate tricuspid regurgitation.    * Moderate pulmonary hypertension with estimated right ventricular systolic  pressure of  46 mmHg.    * Compared to 07/12/19, EF stable, mean PG 4 to 9 mm  Hg, DI 0.51 to 0.56.      Findings  Left Ventricle    The left ventricle is mildly dilated.    Left ventricular mass index is severely increased.    Left ventricular wall thickness is mildly increased.    Left ventricular systolic function is moderately decreased with an ejection  fraction by Biplane Method of Discs of 35 %.    Moderate global hypokinesis without regional variation.    Grade II diastolic dysfunction (pseudonormal pattern) with elevated left  atrial pressure.  Right Ventricle    The right ventricular cavity size is mildly dilated.    Mildly decreased right ventricular systolic function.    RV basal diameter = 4.77 cm.    TAPSE = 1.2 cm, S' = 7 cm/s.      Left Atrium    The left atrium is severely dilated.    Right Atrium    The right atrium is mildly to moderately dilated.    Atrial Septum    No evidence of interatrial shunt by color Doppler.      Aortic Valve    TAVR 29 mm SAPIEN 3 bioprosthetic valve in aortic position with no  impingement on anterior mitral valve leaflet.    No prosthesis stenosis -peak velocity 192 cm/s, mean gradient 9 mmHg, aortic  valve area 2.12 cm2, and an NDSI of 0.56.    There is trivial perivalvular aortic regurgitation.    Pulmonary Valve    The pulmonic valve is structurally normal.    There is trace to mild pulmonic regurgitation.    Mitral Valve    There is mild mitral annular calcification.    The mitral valve is moderately calcified.    There is mild to moderate mitral regurgitation.    Tricuspid Valve    The tricuspid valve is structurally normal.    There is mild to moderate tricuspid regurgitation.    Moderate pulmonary hypertension with estimated right ventricular systolic  pressure of 46 mmHg.      Pericardium / Pleural Effusion    No pericardial effusion visualized.    Inferior Vena Cava    The IVC is not well visualized.    Aorta    The aortic root is normal in size.    The ascending aorta is normal in size.      Measurements  2D  Measurements  ----------------------------------------------------------------------  Name                                 Value        Normal  ----------------------------------------------------------------------    Parasternal 2D  ----------------------------------------------------------------------  IVS Diastolic Thickness (2D)  1.28 cm     0.60-1.00   LVID Diastole (2D)                 6.00 cm     4.20-5.80   LVIW Diastolic Thickness  (2D)                               1.22 cm     0.60-1.05   LVID Systole (2D)                  4.94 cm     2.50-4.00   LVOT Diameter                      2.20 cm                 LA Dimension (2D)                  4.70 cm     3.00-4.10   Ao Root Diameter (2D)              3.10 cm     2.70-3.70   Prox Asc Ao Diameter               3.40 cm     2.60-3.40     LV Ejection Fraction 2D  ----------------------------------------------------------------------  LV EF (BP MOD)                        35 %         52-72     Apical 2D Dimensions  ----------------------------------------------------------------------  RV Basal Diastolic Dimension       4.77 cm     2.50-4.10   LA Volume Index (BP A-L)       53.12 ml/m2       <=34.00   RA Area (4C)                     22.20 cm2       <=18.00  M-mode Measurements  ----------------------------------------------------------------------  Name                                 Value        Normal  ----------------------------------------------------------------------    M-Mode  ----------------------------------------------------------------------  TAPSE                              1.17 cm        >=1.60  LVOT/Aortic Valve Doppler Measurements  ----------------------------------------------------------------------  Name                                 Value        Normal  ----------------------------------------------------------------------    LVOT Doppler  ----------------------------------------------------------------------  LVOT Peak Velocity                 1.07 m/s                   AoV Doppler  ----------------------------------------------------------------------  AV Peak Velocity                  1.92 m/s  AV Mean Gradient                    9 mmHg           <20   AV Area (Cont Eq VTI)             2.26 cm2        >=3.00   AV V1/V2 Ratio                        0.56  RVOT/Pulmonic Valve Doppler Measurements  ----------------------------------------------------------------------  Name                                 Value        Normal  ----------------------------------------------------------------------    PV Doppler  ----------------------------------------------------------------------  PV Peak Velocity                  1.00 m/s  Mitral Valve Measurements  ----------------------------------------------------------------------  Name                                 Value        Normal  ----------------------------------------------------------------------    MV Doppler  ----------------------------------------------------------------------  MV E Peak Velocity                0.81 m/s                 MV A Peak Velocity                0.49 m/s                 MV E/A                                1.65                 MV Pressure Half Time                48 ms                   MV Annular TDI  ----------------------------------------------------------------------  MV Septal e' Velocity             0.06 m/s        >=0.08   MV E/e' (Septal)                     13.23        <=8.00   MV Lateral e' Velocity            0.07 m/s        >=0.10   MV E/e' (Lateral)                    11.23        <=8.00  Tricuspid Valve Measurements  ----------------------------------------------------------------------  Name                                 Value        Normal  ----------------------------------------------------------------------    TV Regurgitation Doppler  ----------------------------------------------------------------------  TR Peak Velocity  3.27 m/s                 RA Pressure                         3 mmHg           <=3   RV Systolic Pressure               46 mmHg           <36     TV Annular TDI  ----------------------------------------------------------------------  TV Lateral Ann s' Velocity        0.07 m/s     0.10-0.19      Report Signatures  Finalized by Norm Parcel  MD on 07/13/2019 10:30 AM  Promoted by Dava Najjar on 07/13/2019 10:08 AM            Results     Procedure Component Value Units Date/Time    CBC without differential [[id number]]  (Abnormal) Collected: 07/13/19 0229    Specimen: Blood Updated: 07/13/19 0339     WBC 5.43 x10 3/uL      Hgb 12.6 g/dL      Hematocrit 40.9 %      Platelets 88 x10 3/uL      RBC 3.74 x10 6/uL      MCV 102.7 fL      MCH 33.7 pg      MCHC 32.8 g/dL      RDW 13 %      MPV 11.7 fL      Nucleated RBC 0.0 /100 WBC      Absolute NRBC 0.00 x10 3/uL     Basic Metabolic Panel [[id number]]  (Abnormal) Collected: 07/13/19 0229    Specimen: Blood Updated: 07/13/19 0318     Glucose 89 mg/dL      BUN 95.6 mg/dL      Creatinine 1.0 mg/dL      Calcium 8.7 mg/dL      Sodium 213 mEq/L      Potassium 4.4 mEq/L      Chloride 108 mEq/L      CO2 21 mEq/L      Anion Gap 9.0    Magnesium [[id number]] Collected: 07/13/19 0229    Specimen: Blood Updated: 07/13/19 0318     Magnesium 1.7 mg/dL     GFR [[id number]] Collected: 07/13/19 0229     Updated: 07/13/19 0318     EGFR >60.0          Fredric Dine, NP  Cardiac Surgery

## 2019-07-13 NOTE — Progress Notes (Signed)
Case Management   Initial Discharge Planning Assessment      Situation Patient: Rick [name], Rick [name] , 86 y.o., male  Admission DX: Aortic valve stenosis, etiology of cardiac valve disease unspecified [I35.0]  IP days: 1  Lace Score: 6   Background   This CM has met with the pt. & pt.'s son by the bedside on 5/26 for the initial assessment.    Pt. is A & O x4. Pt. lives with his son & daughter-in-law.     Pt. does have a lot of family support upon [place].    Pt. was able to dress himself & he uses a cane to get around,   but his son or daughter-in-law does the driving, the cleaning, the cooking & the laundry at home.    DME: Pt. uses a cane to get around.    SN/PT/OT Home Health agency used before: None    Previous SNF/AR Facility used: None     Assessment    Pt. has insurance.    Pt. has family support.    Transportation: Son    Recommendation     CM will continue to monitor patient's progression of care, d/c needs and possible barriers to discharge.             07/13/19 1033   Patient Type   Within 30 Days of Previous Admission? No   Healthcare Decisions   Interviewed: Patient   Orientation/Decision Making Abilities of Patient Alert and Oriented x3, able to make decisions   Prior to admission   Prior level of function Independent with ADLs;Ambulates with assistive device   Type of Residence Private residence   Home Layout Multi-level   Have running water, electricity, heat, etc? Yes   Living Arrangements Children;Family members   How do you get to your MD appointments? Son & daughter-in-law   How do you get your groceries? Son & daughter-in-law   Who fixes your meals? Son & daughter-in-law   Who does your laundry? Son & daughter-in-law   Who picks up your prescriptions? Son & daughter-in-law   Dressing Independent   Grooming Independent   Feeding Independent   Bathing Independent   Toileting Independent   DME Currently at [employer]	[place], [employer]   Discharge Planning   Anticipated [place] plan discussed with: Same  as interviewed;Patient   [place] discussion contact information: Son   Potential barriers to discharge:   (None at this time)   Mode of transportation: Private car (family member)  (Son)   Consults/Providers   PT Evaluation Needed 1   OT Evalulation Needed 1   Correct PCP listed in Epic? Yes   Family and PCP   PCP on file was verified as the current PCP? Yes   Important Message from [facility] Notice   Patient received 1st IMM Letter? Yes   Date of most recent IMM given: 07/13/19     Adora Fridge  [facility] RN Case Manager  [facility]  Case Management Department  [phone]

## 2019-07-14 ENCOUNTER — Telehealth: Payer: Self-pay

## 2019-07-14 ENCOUNTER — Other Ambulatory Visit: Payer: Self-pay

## 2019-07-14 DIAGNOSIS — Z952 Presence of prosthetic heart valve: Secondary | ICD-10-CM

## 2019-07-14 NOTE — Progress Notes (Signed)
[facility] - PROGRESS NOTE      Date & Time: 07/18/2019 1:27 PM  Patient Name: Rick [name];  date of birth  [birth year]    Reason for visit:  1st post-op TAVR FU    Subjective:  86 yrs old male with PMHX of severe AS, CAD s/p CABG x3 2009 with patent grafts, RBBB, HFrEF, mild cognitive impairement,thrombocytopenia(Plt 100K),  knee pain and HLD who returns to the clinic for the 1st post-op checkup.    He was known for severe AS with preserved LVEF by echo 05/20. At that time, he didn't have persistent symptoms. Repeated 2D echo 02/21 revealed worsening AS with reduced LVEF to 30%. Prior to TAVr, he denied any significant exertional SOB with underlying limited activities.However, he had to stop after walking about 50 feet,most likely due to SOB per his son. He has slowed down gradually for last 18 months since he relocated from [place] to [place] to live with his son's family.     He underwent TAVR with 29mm S3 bioprosthesis via right femoral access on 07/12/19 by Dr.Sarin, Dr.Batchelor and Dr.Damluji. given his thrombocytopenia and advance age, he is at risk of bleeding on DAPT. Dr.Batchelor recommended to use Plavix only. He was clinical stable and discharged home the 2nd day.    He is accompanied with his son. He reports slightly improved exertional SOB with underlying limited   activities.  Denies chest pain. Denies palpitation. Denies syncope. Denies pre-syncope. No orthopnea. No PND. No increased leg edema. BP/weight/HRs stable at home per his son.     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 He lives with his son's family, and maintains most of his ADL's. No driving anymore. He uses a cane when he goes out.     NYHA:II-III  CCS angina:0    MEDICATION LIST:     Current Outpatient Medications:     acetaminophen (TYLENOL) 325 MG tablet, Take 2 tablets (650 mg total) by mouth every 4 (four) hours as needed for Pain, Disp: , Rfl:     clopidogrel (PLAVIX) 75 mg tablet, Take 1 tablet (75 mg  total) by mouth every 24 hours (Patient taking differently: Take 75 mg by mouth every 24 hours  ), Disp: 30 tablet, Rfl: 3    docusate sodium (COLACE) 100 MG capsule, Take 100 mg by mouth 2 (two) times daily  , Disp: , Rfl:     memantine (NAMENDA) 10 MG tablet, Take 20 mg by mouth every evening  , Disp: , Rfl:     Multiple Vitamin (multivitamin) capsule, Take 1 capsule by mouth daily  , Disp: , Rfl:     rosuvastatin (CRESTOR) 10 MG tablet, Take 1 tablet (10 mg total) by mouth daily (Patient taking differently: Take 5 mg by mouth daily  ), Disp: 90 tablet, Rfl: 3    SUPPLEMENTS NOT FOUND IN DATABASE, Take 1 tablet by mouth 2 (two) times daily Stop 3 days prior to TAVR  Super Beta plus, Disp: , Rfl:     vitamin D (CHOLECALCIFEROL) 25 MCG (1000 UT) tablet, Take 2,000 Units by mouth daily  , Disp: , Rfl:     Objective:  He underwent TAVR with 29mm S3 bioprosthesis via right femoral access on 07/12/19 by Dr.Sarin, Dr.Batchelor and Dr.Damluji.He was clinical stable and discharged home the 2nd day.    Physical Exam:       Vitals:    07/18/19 1242   BP: 134/81   Pulse: 62  Resp: 18   Temp: 97.2 F (36.2 C)   SpO2: 99%   Weight: 68.9 kg (152 lb)       GENERAL/PSYCH: Cooperative, alert and oriented  EYES: Pupils WNL, EOM grossly WNL.  MOUTH: Dentition: WNL, NO Dentures  NECK: Trachea at Midline/WNL, Thyroid WNL, JVP WNL  CV: rhythm RR, S1/S2: WN, Gallops : None, Murmurs: Diastolic  Peirpheral Pulses: 0: absent, ->4 bounding, B=Bruit,N=No Bruits.                   Carotids                    Radial                  Fem.                P.T            D.P.  R                    2 N                       2                           2N                    2                  2  L                     2 N                       2                           2N                    2                  2  LUNGS: WNL.  GI: Soft, supple, non-distended. No tenderness to palpation. Bowel sounds are present and normal. No masses or  organomegaly noted.  GU: WNL.  SKIN: WNL.EXTREMITIES: +1 pitting ankle edemaNEURO: CNIII-XI: WNL        Motor&Sensory: WNL, Ambulation:____WNL, or _x__with device(cane)  Lab Review:        07/13/19  0229 07/10/19  1422   WBC 5.43 4.14   Hgb 12.6 13.2   Hematocrit 38.4 41.0   Platelets 88* 96*               07/13/19  0229 07/10/19  1422   Sodium 138 142   Potassium 4.4 4.2   CO2 21* 24           07/13/19  0229 07/10/19  1422   BUN 17.0 22.0   Creatinine 1.0 1.4*   Glucose 89 68*   Magnesium 1.7  --            07/10/19  1422   PT INR 1.2*           ECHO:  07/13/19  LVEF 35%, TAVR 29 mm SAPIEN 3 bioprosthetic valve in aortic position with no impingement on anterior mitral valve leaflet. No prosthesis stenosis -peak velocity 192 cm/s, mean gradient  9 mmHg,aortic valve area 2.12 cm2, and an NDSI of  0.56. There is trivial perivalvular aortic regurgitation. There is mild mitral annular calcification. There is mild to moderate mitral regurgitation. There is mild to moderate tricuspid regurgitation. Moderate pulmonary hypertension with estimated right ventricular systolic pressure of  46 mmHg.    ECG:    ECG tracing personally reviewed by me  07/18/19 NSR with RBBB,PRI  07/13/19 NSR with RBBB, PRI  07/12/19 SB with RBBB  07/10/19 SR with RBBB    Assessment/Plan:    1. S/P TAVR (transcatheter aortic valve replacement)      clinical  stabel      2D echo upon discharge revealed normal functioning bioprosthetic valve with trivial PVL      SBE prophylaxis education provided      Continue clopidogrel 75mg  po daily for 3 months after TAVR, then switch back to ASA 81mg  po daily       return to the valve clinic in 43month       return care to primary cardiologist    2. Presence of xenogenic heart valve  - Ambulatory referral to Cardiac Rehabilitation          Daleen Snook, NP  [facility]  Office [phone]

## 2019-07-14 NOTE — Telephone Encounter (Signed)
Structural Heart     Spoke w/pt confirmed for his p/o appts on 07/18/19@1200pm  VC/ 1130 Ekg.    Pt understood and verbally acknowledged the information.

## 2019-07-15 NOTE — Op Note (Signed)
Procedure Date: 07/12/2019     Patient Type: I     SURGEON: Christene Lye MD  ASSISTANT:       Tina GriffithsEsperanza Richters, MD     PREOPERATIVE DIAGNOSIS:  Symptomatic severe aortic stenosis.     POSTOPERATIVE DIAGNOSIS:  Symptomatic severe aortic stenosis.     TITLE OF PROCEDURE:  Right transfemoral transcatheter aortic valve replacement with a 29-mm  Sapien 3 valve.     INDICATIONS FOR PROCEDURE:  Rick [name] is a 86 year old gentleman with symptomatic severe aortic  stenosis.  He was educated regarding the risks and benefits of operative  intervention.  Informed consent was obtained.     DESCRIPTION OF PROCEDURE:  The patient was brought to the hybrid operating room, placed on the  operating table in supine position.  Surgical timeout was called and  confirmed.  Anesthesia for the procedure was a combination of local  anesthetic in the bilateral groins and conscious sedation administered  intravenously.  Using ultrasound guidance and micropuncture needle, left  common femoral artery and vein were accessed.  A 6-French sheath was placed  in either vessel.  The right common femoral artery was similarly accessed.   Two Perclose devices were pre-deployed and a 16-French E-sheath was  advanced over a stiff wire.  The patient was then fully heparinized.  A  flexible tip pacing catheter was advanced into the apex of the right  ventricle for rapid ventricular pacing.  An angled pigtail was then  advanced into the aortic root to identify the appropriate deployment angle  for the procedure.  An AL1 catheter was then used to cross the patient's  native aortic valve.  An Amplatz extra-stiff wire with a gentle curve was  then left in the apex of the left ventricle.  The delivery system for the  transcatheter valve was advanced over the stiff wire into the appropriate  transannular position.  The valve was then deployed using a slow controlled  inflation under a brief period of rapid ventricular pacing.  Final valve  position  was excellent.  Echocardiography and aortography demonstrated  well-seated, well-functioning bioprosthetic valve in aortic position with  trivial perivalvular regurgitation.  Sheaths and wires were then removed.   Protamine was given to reverse circulating heparin.  Hemostasis was  obtained in the bilateral groins.  The patient tolerated the entire  procedure well, was then transported from the operating room to the  recovery area in stable condition.           D:  07/14/2019 18:48 PM by Dr. Minerva Areola L. Garen Grams, MD ([id number])  T:  07/15/2019 05:00 AM by NTS      Everlean Cherry: [id number]) (Doc ID: [id number])

## 2019-07-18 ENCOUNTER — Ambulatory Visit (HOSPITAL_BASED_OUTPATIENT_CLINIC_OR_DEPARTMENT_OTHER): Payer: Medicare Other | Admitting: Adult Health

## 2019-07-18 ENCOUNTER — Ambulatory Visit
Admission: RE | Admit: 2019-07-18 | Discharge: 2019-07-18 | Disposition: A | Discharge status: Home / Self Care | Payer: Medicare Other | Source: Ambulatory Visit | Attending: Adult Health | Admitting: Adult Health

## 2019-07-18 VITALS — BP 134/81 | HR 62 | Temp 97.2°F | Resp 18 | Wt 152.0 lb

## 2019-07-18 DIAGNOSIS — Z953 Presence of xenogenic heart valve: Secondary | ICD-10-CM

## 2019-07-18 DIAGNOSIS — Z952 Presence of prosthetic heart valve: Secondary | ICD-10-CM

## 2019-07-18 LAB — ECG 12-LEAD
Atrial Rate: 65 {beats}/min
P Axis: 63 degrees
P-R Interval: 218 ms
Q-T Interval: 474 ms
QRS Duration: 158 ms
QTC Calculation (Bezet): 492 ms
R Axis: -23 degrees
T Axis: 14 degrees
Ventricular Rate: 65 {beats}/min

## 2019-07-18 NOTE — Patient Instructions (Addendum)
Structural Heart Follow Up  Take Plavix for 90 days, then resume ASA 81mg .    Continue current medical regime  Follow up with Md's as scheduled.   Cardiac rehab- [facility]  Keep your wallet card with you at all times.   Wallet card will be needed prior to scheduling MRI's.  Aware     Update your dentist with implantation of Cardiac device.  Your Dentist will prescribe antibiotics to be taken prior to Dental procedures. Aware     Post Procedure Follow up     Clinic visit 23-75 days post procedure  [facility]-08/04/19-09/26/19       No fasting needed you may bring a snack to this appt.  Echocardiogram, Ekg, blood work, & a questionnaire.      Cardiology visit 1 year post procedure   [facility]-05/10/20-09/07/20  Echocardiogram, Ekg, & Non- fasting lab work   We will contact you to complete a questionnaire.    Nurse Coordinator available Monday - Friday 9am-4pm     Nolon Nations RN # [phone]    Main Clinic Staff available Monday - Friday 9am-4pm     # [phone]

## 2019-07-24 ENCOUNTER — Ambulatory Visit: Payer: Medicare Other

## 2019-07-28 ENCOUNTER — Ambulatory Visit: Payer: Medicare Other

## 2019-08-15 ENCOUNTER — Encounter (INDEPENDENT_AMBULATORY_CARE_PROVIDER_SITE_OTHER): Payer: Self-pay | Admitting: Cardiovascular Disease

## 2019-08-16 ENCOUNTER — Encounter (INDEPENDENT_AMBULATORY_CARE_PROVIDER_SITE_OTHER): Payer: Self-pay | Admitting: Cardiovascular Disease

## 2019-08-16 ENCOUNTER — Ambulatory Visit (INDEPENDENT_AMBULATORY_CARE_PROVIDER_SITE_OTHER): Payer: Medicare Other | Admitting: Cardiovascular Disease

## 2019-08-16 DIAGNOSIS — Z952 Presence of prosthetic heart valve: Secondary | ICD-10-CM

## 2019-08-16 NOTE — Progress Notes (Signed)
[facility] OFFICE VISIT    I had the pleasure of seeing Rick [name] today for cardiovascular follow up. He is a pleasant 86 y.o. male with a history of TAVR with a 29 mm Edwards Sapien valve.  Patient is also status post coronary bypass surgery.  Was noted at cath that the vein graft to the right coronary artery, obtuse marginal 1 and 2 and the LIMA to the LAD were all patent.  Operatively the echo shows normally functioning bioprosthetic valve with a 9 mm gradient.  There is mild to moderate mitral regurgitation with an EF of 35%.  He a systolic pressure was 46 mmHg.  He feels better since the TAVR, and has no CP .            MEDICATIONS: He has a current medication list which includes the following prescription(s): acetaminophen - Take 2 tablets (650 mg total) by mouth every 4 (four) hours as needed for Pain, clopidogrel - Take 1 tablet (75 mg total) by mouth every 24 hours (Patient taking differently: Take 75 mg by mouth every 24 hours   ), docusate sodium - Take 100 mg by mouth 2 (two) times daily   , memantine - Take 20 mg by mouth every evening   , multivitamin - Take 1 capsule by mouth daily   , rosuvastatin - Take 1 tablet (10 mg total) by mouth daily (Patient taking differently: Take 5 mg by mouth daily   ), supplements not found in database - Take 1 tablet by mouth 2 (two) times daily Stop 3 days prior to TAVR   Super Beta plus, and vitamin d - Take 2,000 Units by mouth daily   .    REVIEW OF SYSTEMS: All other systems reviewed and negative except as stated above.    PHYSICAL EXAMINATION  General Appearance: A well-appearing male in no acute distress.   Vital Signs: BP 138/44 (BP Site: Right arm, Patient Position: Sitting, Cuff Size: Medium)    Pulse (!) 46    Temp 98 F (36.7 C)    Resp 18    Ht 1.715 m (5' 7.5")    Wt 64.4 kg (142 lb)    SpO2 96%    BMI 21.91 kg/m    HEENT: Sclera anicteric, conjunctiva without pallor, moist mucous membranes, normal dentition.   Neck: Supple without  jugular venous distention. Thyroid nonpalpable. Normal carotid upstrokes without bruits.  Chest: Clear to auscultation bilaterally with good air movement and respiratory effort and no wheezes, rales, or rhonchi  Cardiovascular: Normal S1 and physiologically split S2 without murmurs, gallops or rub. PMI of normal size and nondisplaced.   Abdomen: Soft, nontender. No organomegaly.  No pulsatile masses or bruits.    Extremities: Warm without edema. All peripheral pulses are full and equal.  Skin: No rash, xanthoma or xanthelasma.   Neuro: Alert and oriented x3. Grossly intact. Strength is symmetrical. Normal mood and affect.         LABS:     IMPRESSION/RECOMMENDATIONS: Rick [name] is a 86 y.o. male who presents for follow up.  Severe aortic valve stenosis-status post placement of a 29 mm TAVR bioprosthesis functioning well    Will place evnt monitor for 1 week to catch any bradycardia that may occur post op.   F/u in 4 months.     Fortino Sic, MD [facility]

## 2019-08-23 ENCOUNTER — Telehealth (INDEPENDENT_AMBULATORY_CARE_PROVIDER_SITE_OTHER): Payer: Medicare Other | Admitting: Student in an Organized Health Care Education/Training Program

## 2019-08-23 ENCOUNTER — Encounter (INDEPENDENT_AMBULATORY_CARE_PROVIDER_SITE_OTHER): Payer: Self-pay | Admitting: Student in an Organized Health Care Education/Training Program

## 2019-08-23 DIAGNOSIS — I350 Nonrheumatic aortic (valve) stenosis: Secondary | ICD-10-CM

## 2019-08-23 DIAGNOSIS — R059 Cough, unspecified: Secondary | ICD-10-CM

## 2019-08-23 DIAGNOSIS — M545 Low back pain: Secondary | ICD-10-CM

## 2019-08-23 DIAGNOSIS — G8929 Other chronic pain: Secondary | ICD-10-CM

## 2019-08-23 DIAGNOSIS — R05 Cough: Secondary | ICD-10-CM

## 2019-08-23 DIAGNOSIS — Z952 Presence of prosthetic heart valve: Secondary | ICD-10-CM

## 2019-08-23 MED ORDER — HYDROCORTISONE ACETATE 25 MG RE SUPP
25.00 mg | Freq: Two times a day (BID) | RECTAL | 1 refills | Status: DC
Start: 2019-08-23 — End: 2020-05-01

## 2019-08-23 MED ORDER — BENZONATATE 100 MG PO CAPS
100.00 mg | ORAL_CAPSULE | Freq: Three times a day (TID) | ORAL | 0 refills | Status: DC | PRN
Start: 2019-08-23 — End: 2020-05-01

## 2019-08-23 NOTE — Progress Notes (Signed)
Patient ID: Rick [name]  is a 86 y.o.  male.     Verbal consent has been obtained from the patient to conduct a video visit encounter to minimize exposure to COVID-19:  Yes     Chief Complaint   Patient presents with    Follow-up      HPI    Problem   S/P Tavr (Transcatheter Aortic Valve Replacement)    Transcatheter Aortic Valve Replacement using a Edward Sapien S3 valve via transfemoral approach by Dr. Garen Grams, 07/12/19, cardiology following, asymptomatic with walking and light household activities, compliant with Plavix     Chronic Bilateral Low Back Pain Without Sciatica    No red flags, improved with PT and wants to resume therapy         Cough - mild intermittent, only at night, no GERD symptoms, no orthopnea/fever/sputum/dypsnea    The following portions of the patient's history were reviewed and updated as appropriate: current medications, allergies, past family history, past medical history, past social history, past surgical history and problem list.     Review of Systems     See HPI    OBJECTIVE     Physical exam limited due to pandemic    There were no vitals taken for this visit.    Physical Exam    GENERAL : alert and in no distress   HEENT: ncat  LUNGS: unlabored breathing  PSYCH: Normal mood and affect, linear thought process, appropriate interactions, no SI/hallucinations      ASSESSMENT      Rick [name] is a 86 y.o. male with   1. S/P TAVR (transcatheter aortic valve replacement)     2. Aortic valve stenosis, etiology of cardiac valve disease unspecified     3. Cough  benzonatate (Tessalon Perles) 100 MG capsule   4. Chronic bilateral low back pain without sciatica  Referral to Physical Therapy-[facility]          PLAN         1. S/P TAVR (transcatheter aortic valve replacement)  - stable, asymptomatic with light activities, now has event monitor, cardiology following    2. Aortic valve stenosis, etiology of cardiac valve disease unspecified  - see 1    3.  Cough  - benzonatate (Tessalon Perles) 100 MG capsule; Take 1 capsule (100 mg total) by mouth 3 (three) times daily as needed for Cough  Dispense: 30 capsule; Refill: 0    4. Chronic bilateral low back pain without sciatica  - Encourage home stretching exercise, avoid mod-heavy lifting, prn Tylenol, resume PT.   - Referral to Physical Therapy-[facility]     Call if symptoms persist, worsen, or change.  Call with updates/questions/concerns.    F/u PRN    I spent 25 mins during this encounter discussing/counseling/coordinating care for the following medical problem(s)     Medication list reviewed with patient and updated as indicated.  Risk & Benefits of any new medication(s) were explained to the patient who verbalized understanding & agreed to the treatment plan.  Patient given a printed copy of AVS. Patient verbalized understanding of instructions given.

## 2019-08-24 ENCOUNTER — Telehealth: Payer: Self-pay

## 2019-08-24 NOTE — Telephone Encounter (Signed)
Structural Heart    Patient's son confirmed 30 day f/u scheduled on 08/25/2019; 0945 EKG /1000 ECHO/ 1100 OV. Pt's son understood and verbally acknowledged information given.

## 2019-08-24 NOTE — Progress Notes (Signed)
[facility] - PROGRESS NOTE      Date & Time: 08/25/2019 2:22 PM  Patient Name: Rick [name];  date of birth  10-May-1924    Reason for visit:  30 day follow up post-op TAVR    Subjective:  86 yrs old male with PMHX of severe AS, CAD s/p CABG x3 2009 with patent grafts, RBBB, HFrEF, mild cognitive impairement, thrombocytopenia(Plt 100K),  knee pain and HLD who returns to the clinic for the 1st post-op checkup.    He was known for severe AS with preserved LVEF by echo 05/20. At that time, he didn't have persistent symptoms. Repeated 2D echo 02/21 revealed worsening AS with reduced LVEF to 30%. Prior to TAVR, he denied any significant exertional SOB with underlying limited activities. However, he had to stop after walking about 50 feet, most likely due to SOB per his son. He has slowed down gradually for last 18 months since he relocated from [place] to [place] to live with his son's family.     He underwent TAVR with 29mm S3 bioprosthesis via right femoral access on 07/12/19 by Dr. Garen Grams, Dr. Florina Ou and Dr. Penny Pia. Given his thrombocytopenia and advance age, he is at risk of bleeding on DAPT. Dr. Florina Ou recommended to use Plavix only. He was clinical stable and discharged home on 07/13/19.    Today, he presents for 30 day follow up visit, and he is accompanied with his son. He reports improvement in dyspnea with daily activities. Denies chest pain. Denies palpitation. Denies syncope. Denies pre-syncope. No orthopnea. No PND. No increased leg edema. Denies any dizziness or lightheadedness. BP/weight/HRs stable at home per his son.     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 He lives with his son's family, and maintains most of his ADLs. No driving anymore. He uses a cane when he goes out.     NYHA: II-III  CCS angina:0    MEDICATION LIST:     Current Outpatient Medications:     acetaminophen (TYLENOL) 325 MG tablet, Take 2 tablets (650 mg total) by mouth every 4 (four) hours as needed for  Pain, Disp: , Rfl:     benzonatate (Tessalon Perles) 100 MG capsule, Take 1 capsule (100 mg total) by mouth 3 (three) times daily as needed for Cough, Disp: 30 capsule, Rfl: 0    clopidogrel (PLAVIX) 75 mg tablet, Take 1 tablet (75 mg total) by mouth every 24 hours (Patient taking differently: Take 75 mg by mouth every 24 hours  ), Disp: 30 tablet, Rfl: 3    docusate sodium (COLACE) 100 MG capsule, Take 100 mg by mouth 2 (two) times daily  , Disp: , Rfl:     hydrocortisone (ANUSOL-HC) 25 MG suppository, Place 1 suppository (25 mg total) rectally 2 (two) times daily, Disp: 12 suppository, Rfl: 1    memantine (NAMENDA) 10 MG tablet, Take 20 mg by mouth every evening  , Disp: , Rfl:     Multiple Vitamin (multivitamin) capsule, Take 1 capsule by mouth daily  , Disp: , Rfl:     rosuvastatin (CRESTOR) 10 MG tablet, Take 1 tablet (10 mg total) by mouth daily (Patient taking differently: Take 5 mg by mouth daily  ), Disp: 90 tablet, Rfl: 3    SUPPLEMENTS NOT FOUND IN DATABASE, Take 1 tablet by mouth 2 (two) times daily Stop 3 days prior to TAVR  Super Beta plus, Disp: , Rfl:     vitamin D (CHOLECALCIFEROL) 25 MCG (  1000 UT) tablet, Take 2,000 Units by mouth daily  , Disp: , Rfl:     Objective:  He underwent TAVR with 29mm S3 bioprosthesis via right femoral access on 07/12/19 by Dr. Garen Grams, Dr. Florina Ou and Dr. Penny Pia. He was clinically stable and discharged home on 07/13/19.    Physical Exam:       Vitals:    08/25/19 1120   BP: 115/80   BP Site: Right arm   Patient Position: Sitting   Pulse: 68   Temp: 97 F (36.1 C)   TempSrc: Temporal   SpO2: 98%   Weight: 67.1 kg (148 lb)       GENERAL/PSYCH: Cooperative, alert and oriented  EYES: Pupils WNL, EOM grossly WNL.  MOUTH: Dentition: WNL, NO Dentures  NECK: Trachea at Midline/WNL, Thyroid WNL, JVP WNL  CV: rhythm RR, S1/S2: WN, Gallops : None, Murmurs: Diastolic  Peirpheral Pulses: 0: absent, ->4 bounding, B=Bruit,N=No Bruits.                   Carotids                     Radial                  Fem.                P.T            D.P.  R                    2 N                       2                           2N                    2                  2  L                     2 N                       2                           2N                    2                  2  LUNGS: WNL.  GI: Soft, supple, non-distended. No tenderness to palpation. Bowel sounds are present and normal. No masses or organomegaly noted.  GU: WNL.  SKIN: WNL.EXTREMITIES: +1 pitting ankle edema  NEURO: CNIII-XI: WNL        Motor&Sensory: WNL, Ambulation:____WNL, or _x__with device(cane)    Lab Review:  Component   Ref Range & Units 08/25/19 12:00 PM 07/13/19 2:29 AM 07/10/19 2:22 PM 05/17/19 1:21 PM   WBC   3.1 - 9.5 x10 3/uL 4.98  5.43  4.14  4.03    Hgb   12.5 - 17.1 g/dL 09.6  04.5  40.9  81.1    Hematocrit   37.6 - 49.6 % 39.5  38.4  41.0  39.5    Platelets  142 - 346 x10 3/uL 97Low   88Low   96Low   101Low     RBC   4.20 - 5.90 x10 6/uL 3.77Low   3.74Low   3.87Low   3.70Low     MCV   78.0 - 96.0 fL 104.8High   102.7High   105.9High   106.8High     MCH   25.1 - 33.5 pg 34.2High   33.7High   34.1High   33.8High     MCHC   31 - 35 g/dL 27.2  53.6  64.4  03.4    RDW   11.0 - 15.0 % 13  13  13  14     MPV   8.9 - 12.5 fL 11.9  11.7  12.1  12.4    Nucleated RBC   0.0 - 0.0 /100 WBC 0.0  0.0  0.0  0.0    Absolute NRBC   0.00 - 0.00 x10 3/uL 0.00  0.00  0.00  0.00      Component   Ref Range & Units 08/25/19 12:00 PM 07/13/19 2:29 AM 07/10/19 2:22 PM 05/17/19 1:21 PM   Glucose   70 - 100 mg/dL 72  89 CM  74QVZ  CM  82 CM    Comment: ADA guidelines for diabetes mellitus:   Fasting: Equal to or greater than 126 mg/dL   Random:  Equal to or greater than 200 mg/dL    BUN   9 - 28 mg/dL 56.3  87.5  64.3  32.9    Creatinine   0.7 - 1.3 mg/dL 1.2  1.0  5.1OACZ   1.3 R    Sodium   136 - 145 mEq/L 142  138  142  142    Potassium   3.5 - 5.1 mEq/L 4.6  4.4  4.2  5.2High     Chloride   100 - 111 mEq/L 109  108  108  108     CO2   22 - 29 mEq/L 23  21Low   24  26 R    Calcium   7.9 - 10.2 mg/dL 9.7  8.7  9.1  66.0    Anion Gap   5 - 15 10.0  9.0 CM  10.0 CM  8.0 CM          ECHO:  On POD1 07/13/19:  LVEF 35%, TAVR 29 mm SAPIEN 3 bioprosthetic valve in aortic position with no impingement on anterior mitral valve leaflet. No prosthesis stenosis - peak velocity 192 cm/s, mean gradient 9 mmHg,aortic valve area 2.12 cm2, and an NDSI of  0.56. There is trivial perivalvular aortic regurgitation. There is mild mitral annular calcification. There is mild to moderate mitral regurgitation. There is mild to moderate tricuspid regurgitation. Moderate pulmonary hypertension with estimated right ventricular systolic pressure of  46 mmHg.    30 day follow up on 08/25/19:  Summary    * The left ventricle is mildly dilated.    * Left ventricular systolic function is severely decreased with an ejection  fraction by Biplane Method of Discs of 30 %.    * Left ventricular wall thickness is mildly increased.    * LVOT VTI is low consistent with low stroke volume (58mL) and cardiac  output (3.3L/min).  Stroke volume index 57mL/m2.    * The right ventricular cavity size is mildly dilated.    * Decreased right ventricular systolic function.  RV FAC 11.2%, RV S' 6.6  cm/s.  TAPSE 16mm.    *  There is a known 29mm Sapien 3 TAVR valve in aortic position. It appears  well seated. DVI 0.3 and EOA 1.3cm2. These values are derived from a low  stroke volume (low LVOT VTI) and may reflect an underestimation of valve area  due to low output state.    * There is mild mitral regurgitation.    * There is mild to moderate tricuspid regurgitation.    * Moderate pulmonary hypertension with estimated right ventricular systolic  pressure of  46 mmHg.    * The ascending aorta is dilated.    * Compared to the prior study, Jul 13 2019, TAVR hemodynamics have changed.  Change in DVI and EOA may be underestimated and due to low output state.    ECG:  ECG tracing personally reviewed by  me    08/25/19: NSR with RBBB, PVCs, PRI 186 ms  07/18/19 NSR with RBBB, PRI  07/13/19 NSR with RBBB, PRI  07/12/19 SB with RBBB  07/10/19 SR with RBBB    Assessment/Plan:    1. S/P TAVR (transcatheter aortic valve replacement) on 07/12/19  - Clinically stable. Echo today showed well seated TAVR valve with normal gradients and no PVL or AI but AVA may be underestimated due to low output state. Message sent to notify Dr. Glean Hess and Dr. Penny Pia for future surveillance of AV  - Discussed cardiac rehab with patient and son. They would prefer to avoid cardiac rehab and do physical therapy at home instead due to the pandemic.  - SBE prophylaxis education provided  - Continue clopidogrel 75mg  po daily for 3 months after TAVR, then switch back to ASA 81 mg po daily monotherapy after 3 months of clopidogrel have been completed    2. Chronic RBBB  - ECG today is stable with chronic RBBB and normal PRI  - Event monitor ordered by Dr. Glean Hess for 1 week to exclude bradyarrhythmias which patient is wearing today. Follow up with primary cardiologist, Dr. Glean Hess.      Cathrine Muster, NP  [facility]  Office [phone]

## 2019-08-25 ENCOUNTER — Ambulatory Visit
Admission: RE | Admit: 2019-08-25 | Discharge: 2019-08-25 | Disposition: A | Discharge status: Home / Self Care | Payer: Medicare Other | Source: Ambulatory Visit | Attending: Adult Health | Admitting: Adult Health

## 2019-08-25 ENCOUNTER — Encounter: Payer: Self-pay | Admitting: Nurse Practitioner

## 2019-08-25 ENCOUNTER — Other Ambulatory Visit: Payer: Self-pay

## 2019-08-25 ENCOUNTER — Ambulatory Visit (HOSPITAL_BASED_OUTPATIENT_CLINIC_OR_DEPARTMENT_OTHER): Payer: Medicare Other | Admitting: Nurse Practitioner

## 2019-08-25 ENCOUNTER — Ambulatory Visit (HOSPITAL_BASED_OUTPATIENT_CLINIC_OR_DEPARTMENT_OTHER)
Admission: RE | Admit: 2019-08-25 | Discharge: 2019-08-25 | Disposition: A | Discharge status: Home / Self Care | Payer: Medicare Other | Source: Ambulatory Visit | Attending: Adult Health | Admitting: Adult Health

## 2019-08-25 DIAGNOSIS — I517 Cardiomegaly: Secondary | ICD-10-CM | POA: Insufficient documentation

## 2019-08-25 DIAGNOSIS — Z952 Presence of prosthetic heart valve: Secondary | ICD-10-CM

## 2019-08-25 DIAGNOSIS — I77810 Thoracic aortic ectasia: Secondary | ICD-10-CM | POA: Insufficient documentation

## 2019-08-25 DIAGNOSIS — I272 Pulmonary hypertension, unspecified: Secondary | ICD-10-CM | POA: Insufficient documentation

## 2019-08-25 DIAGNOSIS — I493 Ventricular premature depolarization: Secondary | ICD-10-CM | POA: Insufficient documentation

## 2019-08-25 DIAGNOSIS — I2510 Atherosclerotic heart disease of native coronary artery without angina pectoris: Secondary | ICD-10-CM | POA: Insufficient documentation

## 2019-08-25 DIAGNOSIS — I5189 Other ill-defined heart diseases: Secondary | ICD-10-CM | POA: Insufficient documentation

## 2019-08-25 DIAGNOSIS — I081 Rheumatic disorders of both mitral and tricuspid valves: Secondary | ICD-10-CM | POA: Insufficient documentation

## 2019-08-25 DIAGNOSIS — R9431 Abnormal electrocardiogram [ECG] [EKG]: Secondary | ICD-10-CM | POA: Insufficient documentation

## 2019-08-25 DIAGNOSIS — R9439 Abnormal result of other cardiovascular function study: Secondary | ICD-10-CM | POA: Insufficient documentation

## 2019-08-25 DIAGNOSIS — I4510 Unspecified right bundle-branch block: Secondary | ICD-10-CM | POA: Insufficient documentation

## 2019-08-25 LAB — ECG 12-LEAD
Atrial Rate: 67 {beats}/min
P Axis: 68 degrees
P-R Interval: 186 ms
Q-T Interval: 464 ms
QRS Duration: 164 ms
QTC Calculation (Bezet): 490 ms
R Axis: -38 degrees
T Axis: 13 degrees
Ventricular Rate: 67 {beats}/min

## 2019-08-25 LAB — ECHOCARDIOGRAM ADULT COMPLETE W CLR/ DOPP WAVEFORM
AV Area (Cont Eq VTI): 1.024
AV Area (Cont Eq VTI): 1.31
AV Mean Gradient: 13.129
AV Mean Gradient: 7
AV Mean Gradient: 8
AV Peak Velocity: 190
AV Peak Velocity: 193
AV Peak Velocity: 237.233
Ao Root Diameter (2D): 3.5
BP Mod LV Ejection Fraction: 29.6
IVS Diastolic Thickness (2D): 1.31
LA Dimension (2D): 4.5
LA Volume Index (BP A-L): 0.06
LVID diastole (2D): 5.94
LVID systole (2D): 5.06
MV Area (PHT): 3.372
MV E/A: 1.4
MV E/A: 1.414
MV E/e' (Average): 9.021
Mitral Valve Findings: NORMAL
Prox Ascending Aorta Diameter: 3.5
Pulmonary Valve Findings: NORMAL
RV Basal Diastolic Dimension: 4.51
RV Systolic Pressure: 38
RV Systolic Pressure: 45.522
TAPSE: 1.61
Tricuspid Valve Findings: NORMAL

## 2019-08-25 LAB — BASIC METABOLIC PANEL
Anion Gap: 10 (ref 5.0–15.0)
BUN: 19 mg/dL (ref 9.0–28.0)
CO2: 23 mEq/L (ref 22–29)
Calcium: 9.7 mg/dL (ref 7.9–10.2)
Chloride: 109 mEq/L (ref 100–111)
Creatinine: 1.2 mg/dL (ref 0.7–1.3)
Glucose: 72 mg/dL (ref 70–100)
Potassium: 4.6 mEq/L (ref 3.5–5.1)
Sodium: 142 mEq/L (ref 136–145)

## 2019-08-25 LAB — CBC
Absolute NRBC: 0 10*3/uL (ref 0.00–0.00)
Hematocrit: 39.5 % (ref 37.6–49.6)
Hgb: 12.9 g/dL (ref 12.5–17.1)
MCH: 34.2 pg — ABNORMAL HIGH (ref 25.1–33.5)
MCHC: 32.7 g/dL (ref 31.5–35.8)
MCV: 104.8 fL — ABNORMAL HIGH (ref 78.0–96.0)
MPV: 11.9 fL (ref 8.9–12.5)
Nucleated RBC: 0 /100 WBC (ref 0.0–0.0)
Platelets: 97 10*3/uL — ABNORMAL LOW (ref 142–346)
RBC: 3.77 10*6/uL — ABNORMAL LOW (ref 4.20–5.90)
RDW: 13 % (ref 11–15)
WBC: 4.98 10*3/uL (ref 3.10–9.50)

## 2019-08-25 LAB — GFR: EGFR: >60

## 2019-08-25 NOTE — Patient Instructions (Addendum)
Structural Heart Follow Up  Continue current medical regime  Follow up with Md's as scheduled.   Keep your wallet card with you at all times.   Update your dentist with implantation of Aortic device, they will want to give you antibiotics before any dental work including cleanings      Primary Cardiology visit 1 year post procedure   Range-05/10/20-09/07/20  Echocardiogram, Ekg, & Non- fasting lab work (CBC + BMP)  We will contact you to complete a questionnaire.    Nurse Coordinator Staff    Nolon Nations RN # [phone]  Casper Harrison RN # [phone]    [facility] # [phone]

## 2019-08-25 NOTE — Progress Notes (Signed)
Pt seen with son  for 30 day post procedure f/u   Follow up testing & KCCQ-12 performed.

## 2019-08-30 ENCOUNTER — Ambulatory Visit (INDEPENDENT_AMBULATORY_CARE_PROVIDER_SITE_OTHER): Payer: Medicare Other | Admitting: Cardiovascular Disease

## 2019-08-30 DIAGNOSIS — Z952 Presence of prosthetic heart valve: Secondary | ICD-10-CM

## 2019-09-01 NOTE — Progress Notes (Signed)
HOLTER REPORT    [facility] - [facility]  Tel [phone]      PATIENT:  Rick [name]         MRN: [id number].  Gender: male.  DOB: Nov 21, 1924.  Age: 86 y.o.  Date of study:  08/16/2019    Ordering Physician:  Rodman Key, MD  Primary Physician:  Erasmo Score, MD  Primary Cardiologist:  Rodman Key, MD    INDICATION:    1. S/P TAVR (transcatheter aortic valve replacement)          DATA:  Test of hookup:  08/16/2019  Recording time:  72 hours  Quality:  good   Tech comments:      Heart Rate Data     Total beats:    Min HR: 49   Avg HR: 67   Max HR: 89     Pauses > 2.0 sec: 445        Longest: 2.7 sec  Ventricular Ectopy     Total VE beats:128289  (33.71%)   Vent runs: 1093 events          Longest: 12 beats          Fastest: 187 bpm   Triplets:  events   Couplets: 1082 events   Supraventricular Ectopy     Total SVE beats:  (%)   Atrial runs:  events          Longest:  beats          Fastest:  bpm   Atrial pairs:  events     FINDINGS:  Baseline rhythm:  Sinus rhythm    Arrhythmia:  2.7 second pause and NSVT    Symptoms reported:  diary not submitted  Symptom correlation with arrhythmia:  diary not submitted    IMPRESSION:   Sinus rhythm predominately seen with average heart rate of 67 bpm   2.7 second pause @ 0535am seen along with AV block   12 beats of NSVT identified       Interpreted and electronically signed by:  Rodman Key, MD  [facility], [facility] [place] - Norberto Sorenson - Faythe Dingwall

## 2019-10-09 ENCOUNTER — Encounter (INDEPENDENT_AMBULATORY_CARE_PROVIDER_SITE_OTHER): Payer: Self-pay | Admitting: Internal Medicine

## 2019-10-09 ENCOUNTER — Inpatient Hospital Stay
Payer: Medicare Other | Attending: Student in an Organized Health Care Education/Training Program | Admitting: Physical Therapist

## 2019-10-09 VITALS — BP 149/74 | HR 66

## 2019-10-09 DIAGNOSIS — M6281 Muscle weakness (generalized): Secondary | ICD-10-CM

## 2019-10-09 DIAGNOSIS — R262 Difficulty in walking, not elsewhere classified: Secondary | ICD-10-CM | POA: Insufficient documentation

## 2019-10-09 NOTE — Progress Notes (Signed)
Name:Rick [name] Collin Age: 86 y.o.   Date of Service: 10/09/2019  Referring Physician: Erasmo Score, MD   Date of Injury: 08/23/2019  Date Care Plan Established/Reviewed: 10/09/2019  Date Treatment Started: 10/09/2019  End of Certification Date: 01/06/2020  Sessions in Plan of Care: 20  Surgery Date: No data was found      Visit Count: 1   Diagnosis:   1. Muscle weakness (generalized)    2. Difficulty in walking, not elsewhere classified        Subjective     History of Present Illness   History of Present Illness: Pt's son is present reports that he is here for his balance and gait. Pt reports that he had a valve  replacement on 07/12/19 and he has slid back some in terms of endurance and balance. Pt states that initially when he had to go to PT before he was having low endurance, balance and back pain on occasion. Pt reports that the strength in his left leg was improved as well as his balance. Pt reports that he can walk with the cane and without and he uses the cane now mostly when he is out and about but does not use it at home.   Functional Limitations (PLOF): Able to walk with SPC > 50 ft (worsening over 6 months)  Able to perform stair negotiation one at a time (could perform with SPC and recipriocal 6 months ago)  Able to don/doff clothes in sitting( could perform with leaning on0worsening over 6 months)    Social Support/Occupation  Lives in: multiple level home  Lives with: adult children  Occupation: retired       Precautions: cardiac  Allergies: Patient has no known allergies.    Past Medical History:   Diagnosis Date    Atrial enlargement, bilateral Echo result 04/04/19    CAD (coronary artery disease) 05/28/19 Cath results.    Severe 3V CAD.  Patent LIMA to LAD, patent SVG to OM 1 and OM 2 of circumflex CA, & patent SVG to RCA.    Chronic combined systolic and diastolic heart failure 310/21 OV Dr. Penny Pia    EF=30%    Dementia Mar 2016    Difficulty walking May 2018    Post surgery of lt knee, rt  femur    Dyslipidemia 310/21 OV Dr. Penny Pia    Dyspnea on exertion Jan 2020    HTN (hypertension) 310/21 OV Dr. Penny Pia    Mild cognitive impairment 310/21 OV Dr. Penny Pia    Primary cardiomyopathy Oct 2019    RBBB 07/10/19 EKg result    S/P CABG (coronary artery bypass graft)      LIMA to LAD,  SVG to OM 1 & OM 2 of circumflex CA, & SVG to RCA.     4  Objective     Observation  Pt stands in stooped position with forward head with hips and knees flexed B    Bed mob   Rolling- increased difficulty and lack of proprioception with body near table edgees, increased energy expenditure    Supine<> sit I       Posture     Lumbar Spine   Decreased lordosis.     Pelvis   Posterior pelvic tilt    Functional Strength   Sit to Stand: UE assist  Squat: partial (to table)  Heel Raises      Bilateral: able but with reduced balace     Unilateral (Left): unable     Unilateral (  Right): unable    Balance   Left   Eyes Open (Left)(sec)      Trial 1: 1 seconds     Trial 2: 1 seconds     Trial 3: 1 seconds     Average: 1 seconds  Right   Eyes Open (Right)(sec)      Trial 1: 2 seconds     Trial 2: 2 seconds     Trial 3: 1 seconds     Average: 1.67 seconds    Gait   Left Side: altered trunk rotation with decreased arm swing  Right Side: altered trunk rotation with decreased arm swing  Pt remains with B knee flexed, reduced hip extension B with SPC     Integumentary   no wound, lesion or rash noted    Neurological Testing     Sensation     Lumbar   Left   Intact: light touch    Right   Intact: light touch    Balance  Full tandem- 4 secodns LLE fwd, 5 seconds RLE fwd  Semi tandem 8 seconds LLE fwd, 5 seconds RLE fwd      BP: 149/74 Heart Rate: 66    Treatment     Therapeutic Exercises - Justified to address any of the following: develop strength, endurance, ROM and/or flexibility.   Reviwed current HEP exercises with patient and corrected form for squats and knee extensions; verbally adding bridges to HEP; provided pt education about  course of rehab      Neuromuscular Re-Education - Justified to address any of the following: of movement, balance, coordination, kinesthetic sense, posture and/or proprioception for sitting and/or standing activities.   Single leg balance  Tandem balance; full,semi tandem all with close supervision           Initial R   R LE Strength  MMT /5 Initial  L   L   4  Hip Flexion(L1/2) 4-    3+  Hip Extension 4    4  Hip Abduction 4    4+  Knee Flexion (S1) 4+    5  Knee Extension (L3) 5    5  Ankle DF (L4) 5      Toe Ext (L5)     (blank fields were intentionally left blank)      ---      ---   Total Time   Timed Minutes  23 minutes   Untimed Minutes  20 minutes   Total Time  43 minutes        Assessment   Tarik is a 86 y.o. male presenting with gait impairments, BLE weakness and balance deficits and occasional low back pain who requires Physical Therapy for the following:  Impairments:   IPTC Impairments: Decreased range of motion  Decreased strength  Decreased bed mobility   Decreased functional stability  Decreased coordination  Decreased static balance  Decreased dynamic balance  Gait impairments      Clinical presentation: stable - predictable recovery pattern  Barriers to therapy: N/A  Functional Limitations (PLOF): Able to walk with SPC > 50 ft (worsening over 6 months)  Able to perform stair negotiation one at a time (could perform with SPC and recipriocal 6 months ago)  Able to don/doff clothes in sitting( could perform with leaning on0worsening over 6 months)  Prognosis: good  Plan   Visits per week: 2  Number of Sessions: 20  Direct One on One  16109: Therapeutic Exercise: To Develop Strength and  Endurance, ROM and Flexibility  [id number]: Gait Training  96045: Neuromuscular Reeducation (Proprioceptive Neuromuscular Faciliation)  97140: Manual Therapy techniques (mobilization, manipulation, manual traction) (Grade I-IV to lumbar spine, pelvic girdle, and regionally interdependent joints,  soft tissue mobilization, instrument assisted soft tissue mobilization.)  97530: Therapeutic Activities: Dynamic activities to improve functional performance  Dry Needling  Supervised Modalities  97010: Thermal modalities: hot/cold packs  40981: Mechnical traction  97014: Electrical stimulation  Plan for next session:   FOTO   HEP update  BLE strengthening  Balance DL   Assess hip and knee ROM/Mobilty       Goals    Goal 1: Patient will demonstrate independence in prescribed HEP with proper form, sets and reps for safe discharge to an independent program.       Sessions: 20      Goal 2: Patient will demonstrate 4+/5 MMT hip extension strength to facilitate ambulation for 30 minutes to perform community ambulation without excessive effort       Sessions: 20      Goal 3: Pt will be able to perform Single leg balance greater than 10 seconds with eyes open to walk on grass and gravel surfaces and perform LE dressing standing safely      Sessions: 20      Goal 4: Patient will perform 10 consecutive squats with 10 lbs load to facilitate lifting objects for IADLS in his home without excessive effort       Sessions: 20                                Blair Heys, DPT

## 2019-10-10 ENCOUNTER — Encounter: Payer: Self-pay | Admitting: Physical Therapist

## 2019-10-13 ENCOUNTER — Inpatient Hospital Stay: Payer: Medicare Other | Admitting: Rehabilitative and Restorative Service Providers"

## 2019-10-13 DIAGNOSIS — R262 Difficulty in walking, not elsewhere classified: Secondary | ICD-10-CM

## 2019-10-13 DIAGNOSIS — M6281 Muscle weakness (generalized): Secondary | ICD-10-CM

## 2019-10-13 NOTE — PT/OT Therapy Note (Signed)
Name: Izaia Say Age: 86 y.o.   Date of Service: 10/13/2019  Referring Physician: Erasmo Score, MD   Date of Injury: 08/23/2019  Date Care Plan Established/Reviewed: 10/09/2019  Date Treatment Started: 10/09/2019  End of Certification Date: 01/06/2020  Sessions in Plan of Care: 20  Surgery Date: 07/12/2019    Visit Count: 2   Diagnosis:   1. Muscle weakness (generalized)    2. Difficulty in walking, not elsewhere classified        Subjective     Social Support/Occupation  Lives in: multiple level home  Lives with: adult children  Occupation: retired     Mr. Beever reports he is doing well. Compliant with HEP, but unable to recall exercises. Reports that he feels the same before and after surgery. Denies SOB, lightheadedness, maybe some fatigue.    S/p transcatheter aortic valve replacement      Precautions: cardiac  Allergies: Patient has no known allergies.    Objective   Initial Evaluation Reference and/or Current Measurements(as dated):    Balance   Left   Eyes Open (Left)(sec)      Trial 1: 1 seconds     Trial 2: 1 seconds     Trial 3: 1 seconds     Average: 1 seconds  Right   Eyes Open (Right)(sec)      Trial 1: 2 seconds     Trial 2: 2 seconds     Trial 3: 1 seconds     Average: 1.67 seconds    Initial R   R LE Strength  MMT /5 Initial  L   L   4  Hip Flexion(L1/2) 4-    3+  Hip Extension 4    4  Hip Abduction 4    4+  Knee Flexion (S1) 4+    5  Knee Extension (L3) 5    5  Ankle DF (L4) 5      Toe Ext (L5)     (blank fields were intentionally left blank)                Treatment     Therapeutic Exercises - Justified to address any of the following: develop strength, endurance, ROM and/or flexibility.   - Reassessment of general mobility  - Update and review of HEP    Seated  -NuStep x 5 min for endurance (maintained conversation to assess intensity)  - Alt knee ext 2 x 10 reps ea side with active DF  -Alt knee flexion 2 x 10 reps    Standing  -BLE gastroc stretch SB 3 x 30  count hold.      Neuromuscular Re-Education - Justified to address any of the following: of movement, balance, coordination, kinesthetic sense, posture and/or proprioception for sitting and/or standing activities.   Supine  -Quad set 2 x 5 reps for activation ea side  -Quad set with SAQ for NMR 2 x 10 reps ea side    Standing  -Glute set 2 x 5 reps for activation  -Glute set with alt hip ext 2 x 10 reps ea side with table support    Therapeutic Activity - Justified to address the following: activities to improve functional performance.  Standing  -sit to stand transfers 2 x 10 reps with BUE table support with cues for ant pelvic tilt reach  -BHR  2 x 10 reps with table support  -Wide single sidesteps with table support 2 x 8 reps    Home Exercises  Access Code: [email or web address]  URL: [email or web address]  Date: 10/13/2019  Prepared by: Anabel Halon    Exercises  Mini Squat with Chair - 1 x daily - 7 x weekly - 3 sets - 10 reps  Standing Gluteal Sets - 1 x daily - 7 x weekly - 2 sets - 10 reps  Standing Hip Extension with Counter Support - 1 x daily - 7 x weekly - 2 sets - 10 reps  Standing Heel Raise - 1 x daily - 7 x weekly - 2 sets - 10 reps       ---      ---   Total Time   Timed Minutes  43 minutes   Total Time  43 minutes        Assessment   Mr. Joslyn demonstrates reduced generalized strength and endurance with ambulation. He does not endorse episodes of SOB or dizziness during tx, but objectively appears out of breath at times and offered breaks (took 3). Updated HEP for concise (both sitting and standing with counter support) program to increase endurance without increasing risk of falls. Mr. Deeney will continue to benefit from skilled PT treatment in efforts to address his impairments in efforts to maximize his functional mobility and reduce fall risk.    Plan   HEP progression  BLE strengthening  Balance DL   Assess hip and knee ROM/Mobilty       Goals    Goal 1: Patient will demonstrate  independence in prescribed HEP with proper form, sets and reps for safe discharge to an independent program.       Sessions: 20      Goal 2: Patient will demonstrate 4+/5 MMT hip extension strength to facilitate ambulation for 30 minutes to perform community ambulation without excessive effort       Sessions: 20      Goal 3: Pt will be able to perform Single leg balance greater than 10 seconds with eyes open to walk on grass and gravel surfaces and perform LE dressing standing safely      Sessions: 20      Goal 4: Patient will perform 10 consecutive squats with 10 lbs load to facilitate lifting objects for IADLS in his home without excessive effort       Sessions: 20                                  Glori Bickers, DPT

## 2019-10-17 ENCOUNTER — Telehealth (INDEPENDENT_AMBULATORY_CARE_PROVIDER_SITE_OTHER): Payer: Medicare Other | Admitting: Student in an Organized Health Care Education/Training Program

## 2019-10-17 ENCOUNTER — Inpatient Hospital Stay: Payer: Medicare Other | Admitting: Physical Therapist

## 2019-10-17 ENCOUNTER — Encounter (INDEPENDENT_AMBULATORY_CARE_PROVIDER_SITE_OTHER): Payer: Self-pay | Admitting: Student in an Organized Health Care Education/Training Program

## 2019-10-17 ENCOUNTER — Encounter (INDEPENDENT_AMBULATORY_CARE_PROVIDER_SITE_OTHER): Payer: Self-pay

## 2019-10-17 DIAGNOSIS — R262 Difficulty in walking, not elsewhere classified: Secondary | ICD-10-CM

## 2019-10-17 DIAGNOSIS — M6281 Muscle weakness (generalized): Secondary | ICD-10-CM

## 2019-10-17 DIAGNOSIS — F0390 Unspecified dementia without behavioral disturbance: Secondary | ICD-10-CM | POA: Insufficient documentation

## 2019-10-17 MED ORDER — MEMANTINE HCL 10 MG PO TABS
20.0000 mg | ORAL_TABLET | Freq: Every evening | ORAL | 0 refills | Status: DC
Start: 2019-10-17 — End: 2022-05-29

## 2019-10-17 NOTE — Progress Notes (Signed)
Patient ID: Rick [name]  is a 86 y.o.  male.     Verbal consent has been obtained from the patient to conduct a video visit encounter to minimize exposure to COVID-19:  Yes     Chief Complaint   Patient presents with    Follow-up      HPI    Problem   Dementia Without Behavioral Disturbance, Unspecified Dementia Type    Treated by specialist in [place], currently lives with his son who is supportive, stable on memantine 10mg , no mood disorder/agitation, to establish care with neuology    BP Readings from Last 3 Encounters:   10/09/19 149/74   08/25/19 115/80   08/16/19 138/44            The following portions of the patient's history were reviewed and updated as appropriate: current medications, allergies, past family history, past medical history, past social history, past surgical history and problem list.     Review of Systems     See HPI    OBJECTIVE     Physical exam limited due to pandemic/telemed visit    There were no vitals taken for this visit.    Physical Exam    GENERAL : alert and in no distress   HEENT: ncat  LUNGS: unlabored breathing  PSYCH: Normal mood and affect, linear thought process, appropriate interactions, no SI/hallucinations      ASSESSMENT      Rick [name] is a 86 y.o. male with   1. Dementia without behavioral disturbance, unspecified dementia type  memantine (NAMENDA) 10 MG tablet    Neurology Referral: Fraser Din, MD [facility])          PLAN         1. Dementia without behavioral disturbance, unspecified dementia type  - stable on memantine, to establish care with neurology  - memantine (NAMENDA) 10 MG tablet; Take 2 tablets (20 mg total) by mouth every evening  Dispense: 180 tablet; Refill: 0  - Neurology Referral: Fraser Din, MD [facility])     Call if symptoms persist, worsen, or change.  Call with updates/questions/concerns.      I spent 15 mins during this encounter discussing/counseling/coordinating care for the following medical problem(s)      Medication list reviewed with patient and updated as indicated.  Risk & Benefits of any new medication(s) were explained to the patient who verbalized understanding & agreed to the treatment plan.  Patient given a printed copy of AVS. Patient verbalized understanding of instructions given.

## 2019-10-17 NOTE — PT/OT Therapy Note (Signed)
Name: Korey Prashad Age: 86 y.o.   Date of Service: 10/17/2019  Referring Physician: Erasmo Score, MD   Date of Injury: 08/23/2019  Date Care Plan Established/Reviewed: 10/09/2019  Date Treatment Started: 10/09/2019  End of Certification Date: 01/06/2020  Sessions in Plan of Care: 20  Surgery Date: 07/12/2019    Visit Count: 3   Diagnosis:   1. Muscle weakness (generalized)    2. Difficulty in walking, not elsewhere classified        Subjective     Social Support/Occupation  Lives in: multiple level home  Lives with: adult children  Occupation: retired     Mr. Fuhs denies soreness or changes to gait or movement after last session.     S/p transcatheter aortic valve replacement      Precautions: cardiac  Allergies: Patient has no known allergies.    Objective   Initial Evaluation Reference and/or Current Measurements(as dated):    Balance   Left   Eyes Open (Left)(sec)      Trial 1: 1 seconds     Trial 2: 1 seconds     Trial 3: 1 seconds     Average: 1 seconds  Right   Eyes Open (Right)(sec)      Trial 1: 2 seconds     Trial 2: 2 seconds     Trial 3: 1 seconds     Average: 1.67 seconds    Initial R   R LE Strength  MMT /5 Initial  L   L   4  Hip Flexion(L1/2) 4-    3+  Hip Extension 4    4  Hip Abduction 4    4+  Knee Flexion (S1) 4+    5  Knee Extension (L3) 5    5  Ankle DF (L4) 5      Toe Ext (L5)     (blank fields were intentionally left blank)  Range of Motion: (degrees)     Right  AROM 8/31 I    Left AROM 8/31 InitialLeft PROM   Left AROM   Left PROM       Hip Flexion       5     IR 15      30    ER 10      108    Knee flex 110      -10    Knee ext 12                                       (blank fields were intentionally left blank)    HS length  L 55 deg  R 40 deg                    Treatment     Therapeutic Exercises - Justified to address any of the following: develop strength, endurance, ROM and/or flexibility.   -Reassessment of hip and knee mobility  - Update and  review of HEP    Seated  -NuStep x 5 min for endurance (maintained conversation to assess intensity)    Seated  abd with YTB around knees 2x10     Supine  Add hamstring stretch 2x 30" B  Add figure four stretch with TC for proper angle of stretch 2x30" B    Not today   Standing  -BLE gastroc stretch SB 3 x 30 count hold.  -  Alt knee ext 2 x 10 reps ea side with active DF  -Alt knee flexion 2 x 10 reps      Neuromuscular Re-Education - Justified to address any of the following: of movement, balance, coordination, kinesthetic sense, posture and/or proprioception for sitting and/or standing activities.   Supine  -Quad set 2 x 5 reps for activation ea side  -Quad set with SAQ for NMR 2 x 10 reps ea side    Standing  -Glute set 2x10    Therapeutic Activity - Justified to address the following: activities to improve functional performance.  Standing  -sit to stand transfers 2 x 10 reps with BUE table support with cues for ant pelvic tilt reach  Bed mobility: scooting, supine to sidelying, sidelying to sit with log roll technique         Home Exercises   Access Code: [email or web address]  URL: [email or web address]  Date: 10/13/2019  Prepared by: Anabel Halon    Exercises  Mini Squat with Chair - 1 x daily - 7 x weekly - 3 sets - 10 reps  Standing Gluteal Sets - 1 x daily - 7 x weekly - 2 sets - 10 reps  Standing Hip Extension with Counter Support - 1 x daily - 7 x weekly - 2 sets - 10 reps  Standing Heel Raise - 1 x daily - 7 x weekly - 2 sets - 10 reps       ---      ---   Total Time   Timed Minutes  40 minutes   Total Time  40 minutes        Assessment   Mr. Senft demonstrates reduced mobility throughout B hip and knees which are keeping him in a crouched position while ambulating. PT providing exercises today to reduce mobility limitations and pt benefits from [facility] for proper form. Pt demo difficulty with bed mobility today which improved significantly with log roll technique and PT encouraging pt to practice in middle  of bed first for rolling to reduce fear. Mr. Schopf will continue to benefit from skilled PT treatment in efforts to address his impairments in efforts to improve his ambulation and reduce falls risk.     Plan   Consider tug next session  BLE strengthening  Balance DL         Goals    Goal 1: Patient will demonstrate independence in prescribed HEP with proper form, sets and reps for safe discharge to an independent program.       Sessions: 20      Goal 2: Patient will demonstrate 4+/5 MMT hip extension strength to facilitate ambulation for 30 minutes to perform community ambulation without excessive effort       Sessions: 20      Goal 3: Pt will be able to perform Single leg balance greater than 10 seconds with eyes open to walk on grass and gravel surfaces and perform LE dressing standing safely      Sessions: 20      Goal 4: Patient will perform 10 consecutive squats with 10 lbs load to facilitate lifting objects for IADLS in his home without excessive effort       Sessions: 20                                  Blair Heys, DPT

## 2019-10-18 ENCOUNTER — Encounter (INDEPENDENT_AMBULATORY_CARE_PROVIDER_SITE_OTHER): Payer: Self-pay | Admitting: Student in an Organized Health Care Education/Training Program

## 2019-10-19 ENCOUNTER — Inpatient Hospital Stay
Payer: Medicare Other | Attending: Student in an Organized Health Care Education/Training Program | Admitting: Rehabilitative and Restorative Service Providers"

## 2019-10-19 DIAGNOSIS — R262 Difficulty in walking, not elsewhere classified: Secondary | ICD-10-CM | POA: Insufficient documentation

## 2019-10-19 DIAGNOSIS — M6281 Muscle weakness (generalized): Secondary | ICD-10-CM | POA: Insufficient documentation

## 2019-10-19 NOTE — PT/OT Therapy Note (Signed)
Name: Levie Wages Age: 86 y.o.   Date of Service: 10/19/2019  Referring Physician: Erasmo Score, MD   Date of Injury: 08/23/2019  Date Care Plan Established/Reviewed: 10/09/2019  Date Treatment Started: 10/09/2019  End of Certification Date: 01/06/2020  Sessions in Plan of Care: 20  Surgery Date: 07/12/2019    Visit Count: 4   Diagnosis:   1. Muscle weakness (generalized)    2. Difficulty in walking, not elsewhere classified        Subjective     Social Support/Occupation  Lives in: multiple level home  Lives with: adult children  Occupation: retired     Mr.Radziewicz reports that he is doing ok. No new soreness. Compliant with HEP.     PmHx: S/p transcatheter aortic valve replacement.      Precautions: cardiac  Allergies: Patient has no known allergies.    Objective   Initial Evaluation Reference and/or Current Measurements(as dated):    Balance   Left   Eyes Open (Left)(sec)      Trial 1: 1 seconds     Trial 2: 1 seconds     Trial 3: 1 seconds               10/19/19     Average: 1 seconds       0 sec  Right   Eyes Open (Right)(sec)      Trial 1: 2 seconds     Trial 2: 2 seconds     Trial 3: 1 seconds               10/19/19     Average: 1.67 seconds        0 sec    Initial R   R LE Strength  MMT /5 Initial  L   L   4  Hip Flexion(L1/2) 4-    3+  Hip Extension 4    4  Hip Abduction 4    4+  Knee Flexion (S1) 4+    5  Knee Extension (L3) 5    5  Ankle DF (L4) 5      Toe Ext (L5)     (blank fields were intentionally left blank)    Range of Motion: (degrees)     Right  AROM 8/31     Left AROM 8/31 InitialLeft PROM   Left AROM   Left PROM       Hip Flexion       5     IR 15      30    ER 10      108    Knee flex 110      Lacking 10    Knee ext Lacking  12                                       (blank fields were intentionally left blank)    HS length  L 55 deg  R 40 deg                    Treatment     Therapeutic Exercises - Justified to address any of the following: develop strength,  endurance, ROM and/or flexibility.   - Reassessment of balance, mobility, and quad/glute activation  - Review of HEP    Supine  -Quad set with SAQ for NMR 2 x 10 reps ea side  --[employer]  set with SAQ + SLR for NMR 2 x 10 reps ea side  -B knee PROM x 5 min     Seated on FOAM PAD  -Red weighted ball overhead press BUE 2 x 5 reps    Standing  -BUE reach along wall for upright posture 2 x 8 reps    Neuromuscular Re-Education - Justified to address any of the following: of movement, balance, coordination, kinesthetic sense, posture and/or proprioception for sitting and/or standing activities.   Supine  -Glute set with PPT 2x5  -Glute set with PPT and low bridge 2 x 5 reps    Sitting on FOAM PAD  -Posterior trunk lean for core NMR 2 x 8 reps  -Lateral trunk lean for core NMR (coordinated with seated march) 2 x 10 reps ea side    Gait Training - Justified to address any of the following:  Ambulation including weight bearing variation, gait sequencing, instruction/progression of an assistive device, and/or stair climbing.    Ambulation  -SBA 66ft x 3 with vc for upright posture, ankle DF    Standing  -Gastroc SB in standing ea side 15 count x 4.   -BHR along wall 2 x 10 reps    Seated  -Anterior trunk lean onto mobile stool for BLE loading/weight shifts 2 x 10 reps    Home Exercises   Access Code: [email or web address]  URL: [email or web address]  Date: 10/13/2019  Prepared by: Anabel Halon    Exercises  Mini Squat with Chair - 1 x daily - 7 x weekly - 3 sets - 10 reps  Standing Gluteal Sets - 1 x daily - 7 x weekly - 2 sets - 10 reps  Standing Hip Extension with Counter Support - 1 x daily - 7 x weekly - 2 sets - 10 reps  Standing Heel Raise - 1 x daily - 7 x weekly - 2 sets - 10 reps       ---      ---   Total Time   Timed Minutes  43 minutes   Total Time  43 minutes        Assessment   Mr. Ferns demonstrates difficulty with ambulation and related SLS due to reduced B hip and knee extension. Today focused on techniques for B  gastroc/hamstring lengthening in combination with active quad engagement. Pt was agreeable and completed all exercises. Mr. Krach noted that he felt fine (and not tired) at the end of treatment, but his ambulation was significantly more unstable and he required SBA to minimize risk of falls. Discussed fatigue level with son upon conclusion of PT session. Mr. Tuman will continue to benefit from skilled PT treatment to address his impairments in efforts to improve his ambulation and reduce fall risk.     Plan   Consider tug next session  BLE strengthening  Balance DL   HS stretching  Core engagement        Goals    Goal 1: Patient will demonstrate independence in prescribed HEP with proper form, sets and reps for safe discharge to an independent program.  10/19/19: Reports compliance with HEP.        Sessions: 20      Goal 2: Patient will demonstrate 4+/5 MMT hip extension strength to facilitate ambulation for 30 minutes to perform community ambulation without excessive effort       Sessions: 20      Goal 3: Pt will be able to perform Single leg balance greater than 10 seconds  with eyes open to walk on grass and gravel surfaces and perform LE dressing standing safely   10/19/19: Unable to tol SLS- reduced ankle strategy.-ST     Sessions: 20      Goal 4: Patient will perform 10 consecutive squats with 10 lbs load to facilitate lifting objects for IADLS in his home without excessive effort       Sessions: 20                                  Glori Bickers, DPT

## 2019-10-24 ENCOUNTER — Inpatient Hospital Stay: Payer: Medicare Other | Admitting: Physical Therapist

## 2019-10-26 ENCOUNTER — Inpatient Hospital Stay: Payer: Medicare Other | Admitting: Rehabilitative and Restorative Service Providers"

## 2019-10-26 DIAGNOSIS — R262 Difficulty in walking, not elsewhere classified: Secondary | ICD-10-CM

## 2019-10-26 DIAGNOSIS — M6281 Muscle weakness (generalized): Secondary | ICD-10-CM

## 2019-10-26 NOTE — PT/OT Therapy Note (Signed)
Name: Rick [name] Age: 86 y.o.   Date of Service: 10/26/2019  Referring Physician: Erasmo Score, MD   Date of Injury: 08/23/2019  Date Care Plan Established/Reviewed: 10/09/2019  Date Treatment Started: 10/09/2019  End of Certification Date: 01/06/2020  Sessions in Plan of Care: 20  Surgery Date: 07/12/2019    Visit Count: 5   Diagnosis:   1. Muscle weakness (generalized)    2. Difficulty in walking, not elsewhere classified        Subjective     Social Support/Occupation  Lives in: multiple level home  Lives with: adult children  Occupation: retired     Rick [name] reports no significant changes since his last appt. He says he needed a nap after last appt. Walking feels the same.     PmHx: S/p transcatheter aortic valve replacement.      Precautions: cardiac  Allergies: Patient has no known allergies.    Objective   Initial Evaluation Reference and/or Current Measurements(as dated):    Balance   Left   Eyes Open (Left)(sec)      Trial 1: 1 seconds     Trial 2: 1 seconds     Trial 3: 1 seconds               10/19/19     Average: 1 seconds       0 sec  Right   Eyes Open (Right)(sec)      Trial 1: 2 seconds     Trial 2: 2 seconds     Trial 3: 1 seconds               10/19/19     Average: 1.67 seconds        0 sec    Initial R 10/26/19  R LE Strength  MMT /5 Initial  L 10/26/19  L   4 4 Hip Flexion(L1/2) 4- 4   3+ 4 Hip Extension 4 4-   4  Hip Abduction 4    4+  Knee Flexion (S1) 4+    5  Knee Extension (L3) 5    5  Ankle DF (L4) 5      Toe Ext (L5)     (blank fields were intentionally left blank)    Range of Motion: (degrees)     Right  AROM 8/31     Left AROM 8/31 InitialLeft PROM   Left AROM   Left PROM       Hip Flexion       5     IR 15      30    ER 10      108    Knee flex 110      Lacking 10    Knee ext Lacking  12                                       (blank fields were intentionally left blank)    HS length  L 55 deg  R 40 deg                    Treatment     Therapeutic Exercises -  Justified to address any of the following: develop strength, endurance, ROM and/or flexibility.   - Reassessment of BLE strength, gait, balance.    Seated  -Nustep x 5 min lvl 4.0 for endurance  -BUE overhead  reach and breathing for upright posture x 10    Supine  -B knee PROM with therapist x 5 min   -B HS stretches x 3 min ea side     Neuromuscular Re-Education - Justified to address any of the following: of movement, balance, coordination, kinesthetic sense, posture and/or proprioception for sitting and/or standing activities.   Standing on FOAM PAD  -BALANCE: B stance with reduced table support and cues for upright posture x 2 min  -BALANCE: B march 2 x 15 reps  -BALANCE: B march with tc for lateral shifts 2 x 8 reps ea side    Standing bent over table/bolster  -Glute set with alt hip ext 2 x 10 reps ea side.     Sitting on FOAM PAD  -Lateral trunk lean for core NMR (coordinated with seated march) 2 x 10 reps ea side  -Lateral trunk lean and reach to ball 2 x 5 reps ea side.     Therapeutic Activity - Justified to address the following: activities to improve functional performance.  -Sit to stand using table support for ant trunk lean 2 x 10 reps  -BHR along wall 2 x 10 reps  -Gastroc SB in standing ea side 15 count x 4.    Home Exercises   Access Code: [email or web address]  URL: [email or web address]  Date: 10/13/2019  Prepared by: Anabel Halon    Exercises  Mini Squat with Chair - 1 x daily - 7 x weekly - 3 sets - 10 reps  Standing Gluteal Sets - 1 x daily - 7 x weekly - 2 sets - 10 reps  Standing Hip Extension with Counter Support - 1 x daily - 7 x weekly - 2 sets - 10 reps  Standing Heel Raise - 1 x daily - 7 x weekly - 2 sets - 10 reps       ---      ---   Total Time   Timed Minutes  44 minutes   Total Time  44 minutes        Assessment   Despite limitations in B knee and hip AROM, Rick [name] tolerated balance drills on a soft, unstable surface well. With increased practice, he was able to weightshift  forward/backward and laterally with increased confidence. Lateral shifting was more difficult, even when combined with marching, but improved with therapist tactile facilitation. Increased practice will likely assist with achieving SLS. Rick [name] endorsed fatigue at the conclusion of therapy and required SBA with SPC amb upon exit. Rick [name] will continue to benefit from skilled PT treatment to address his impairments in efforts to improve his ambulation and reduce fall risk.     Plan   TUG next session  BLE strengthening  Balance DL   HS stretching  Core engagement        Goals    Goal 1: Patient will demonstrate independence in prescribed HEP with proper form, sets and reps for safe discharge to an independent program.  10/19/19: Reports compliance with HEP.        Sessions: 20      Goal 2: Patient will demonstrate 4+/5 MMT hip extension strength to facilitate ambulation for 30 minutes to perform community ambulation without excessive effort  9/921: Progressing. Tested in standing 4-/5 L, 4/5 R  -ST       Sessions: 20      Goal 3: Pt will be able to perform Single leg balance greater than 10 seconds with eyes open to walk on  grass and gravel surfaces and perform LE dressing standing safely   10/19/19: Unable to tol SLS- reduced ankle strategy.-ST     Sessions: 20      Goal 4: Patient will perform 10 consecutive squats with 10 lbs load to facilitate lifting objects for IADLS in his home without excessive effort.  10/26/19: Progressing. Tol consecutive sit to stand x 10. -ST       Sessions: 20                                  Glori Bickers, DPT

## 2019-10-31 ENCOUNTER — Inpatient Hospital Stay: Payer: Medicare Other | Admitting: Physical Therapist

## 2019-10-31 ENCOUNTER — Telehealth: Payer: Self-pay | Admitting: Physical Therapist

## 2019-10-31 NOTE — Telephone Encounter (Signed)
PT called and LVM with next appt information and to call if need to reschedule/cancel.

## 2019-11-02 ENCOUNTER — Inpatient Hospital Stay: Payer: Medicare Other | Admitting: Rehabilitative and Restorative Service Providers"

## 2019-11-02 DIAGNOSIS — M6281 Muscle weakness (generalized): Secondary | ICD-10-CM

## 2019-11-02 DIAGNOSIS — R262 Difficulty in walking, not elsewhere classified: Secondary | ICD-10-CM

## 2019-11-02 NOTE — PT/OT Therapy Note (Signed)
Name: Husam Hohn Age: 86 y.o.   Date of Service: 11/02/2019  Referring Physician: Erasmo Score, MD   Date of Injury: 08/23/2019  Date Care Plan Established/Reviewed: 10/09/2019  Date Treatment Started: 10/09/2019  End of Certification Date: 01/06/2020  Sessions in Plan of Care: 20  Surgery Date: 07/12/2019    Visit Count: 6   Diagnosis:   1. Muscle weakness (generalized)    2. Difficulty in walking, not elsewhere classified        Subjective     Social Support/Occupation  Lives in: multiple level home  Lives with: adult children  Occupation: retired     Mr.Ordway reports no falls since last visit. Walking feels better.     PmHx: S/p transcatheter aortic valve replacement.      Precautions: cardiac  Allergies: Patient has no known allergies.    Objective   Initial Evaluation Reference and/or Current Measurements(as dated):    Balance   Left   Eyes Open (Left)(sec)      Trial 1: 1 seconds     Trial 2: 1 seconds     Trial 3: 1 seconds               10/19/19   11/02/19     Average: 1 seconds       0 sec      1 sec  Right   Eyes Open (Right)(sec)      Trial 1: 2 seconds     Trial 2: 2 seconds     Trial 3: 1 seconds               10/19/19   11/02/19     Average: 1.67 seconds        0 sec      1 sec    Initial R 10/26/19  R LE Strength  MMT /5 Initial  L 10/26/19  L   4 4 Hip Flexion(L1/2) 4- 4   3+ 4 Hip Extension 4 4-   4  Hip Abduction 4    4+  Knee Flexion (S1) 4+    5  Knee Extension (L3) 5    5  Ankle DF (L4) 5      Toe Ext (L5)     (blank fields were intentionally left blank)    Range of Motion: (degrees)     Right  AROM 8/31     Left AROM 8/31 InitialLeft PROM   Left AROM   Left PROM       Hip Flexion       5     IR 15      30    ER 10      108    Knee flex 110      Lacking 10    Knee ext Lacking  12                                       (blank fields were intentionally left blank)    HS length  L 55 deg  R 40 deg                    Treatment     Therapeutic Exercises - Justified to  address any of the following: develop strength, endurance, ROM and/or flexibility.   - Reassessment of BLE strength, gait, balance.    Seated  -Nustep legs only  x 4 min lvl 2.5 for endurance    Standing  -SB B gastroc/soleus stretches 3 x 30 sec ea side  -Step HS stretches x 3 min ea side     Neuromuscular Re-Education - Justified to address any of the following: of movement, balance, coordination, kinesthetic sense, posture and/or proprioception for sitting and/or standing activities.   Standing on FOAM PAD  -BALANCE: B stance with fwd/bwd weightshifting EO x 3 min (cues for hip movement)  -BALANCE: B march 2 x 15 reps  -BALANCE: B march with tc for lateral shifts 2 x 10 reps ea side    Therapeutic Activity - Justified to address the following: activities to improve functional performance.  Standing // bars  -Short hurdle (3) wide stride for side stepping 6 ft x 6  -Straddle stepping on [employer] step with therapist assist for shift 2 x 10 reps  -Straddle stepping on [employer] step (no assist) 2 x 10 reps  -Step ups 2 x 10 reps  -Stand to sit with no UE support (with therapist HHA for weightshift).   -BHR 2  x10 reps    Home Exercises   Access Code: [email or web address]  URL: [email or web address]  Date: 10/13/2019  Prepared by: Anabel Halon    Exercises  Mini Squat with Chair - 1 x daily - 7 x weekly - 3 sets - 10 reps  Standing Gluteal Sets - 1 x daily - 7 x weekly - 2 sets - 10 reps  Standing Hip Extension with Counter Support - 1 x daily - 7 x weekly - 2 sets - 10 reps  Standing Heel Raise - 1 x daily - 7 x weekly - 2 sets - 10 reps       ---      ---   Total Time   Timed Minutes  42 minutes   Total Time  42 minutes        Assessment   Mr. Eimers demonstrated increased confidence with weightshifting and balance. Still requires min hand held assist for lateral shifting in order to clear a step/hurdle without a loss of balance- especially when wide step required. Also repeat verbal cueing to maintain upright trunk in  stance. Mr. Helfman will continue to benefit from skilled PT treatment to address his impairments in efforts to improve his ambulation and reduce fall risk.     Plan   TUG next session  BLE strengthening  Balance DL   HS stretching  Core engagement        Goals    Goal 1: Patient will demonstrate independence in prescribed HEP with proper form, sets and reps for safe discharge to an independent program.  10/19/19: Reports compliance with HEP.        Sessions: 20      Goal 2: Patient will demonstrate 4+/5 MMT hip extension strength to facilitate ambulation for 30 minutes to perform community ambulation without excessive effort  9/921: Progressing. Tested in standing 4-/5 L, 4/5 R  -ST       Sessions: 20      Goal 3: Pt will be able to perform Single leg balance greater than 10 seconds with eyes open to walk on grass and gravel surfaces and perform LE dressing standing safely   10/19/19: Unable to tol SLS- reduced ankle strategy.-ST     Sessions: 20      Goal 4: Patient will perform 10 consecutive squats with 10 lbs load to facilitate lifting objects for IADLS in his home without excessive  effort.  10/26/19: Progressing. Tol consecutive sit to stand x 10. -ST       Sessions: 20                                  Glori Bickers, DPT

## 2019-11-06 ENCOUNTER — Inpatient Hospital Stay: Payer: Medicare Other | Admitting: Professional

## 2019-11-06 DIAGNOSIS — M6281 Muscle weakness (generalized): Secondary | ICD-10-CM

## 2019-11-06 DIAGNOSIS — R262 Difficulty in walking, not elsewhere classified: Secondary | ICD-10-CM

## 2019-11-06 NOTE — PT/OT Therapy Note (Signed)
Name: Cael Worth Age: 86 y.o.   Date of Service: 11/06/2019  Referring Physician: Erasmo Score, MD   Date of Injury: 08/23/2019  Date Care Plan Established/Reviewed: 10/09/2019  Date Treatment Started: 10/09/2019  End of Certification Date: 01/06/2020  Sessions in Plan of Care: 20  Surgery Date: 07/12/2019    Visit Count: 7   Diagnosis:   1. Muscle weakness (generalized)    2. Difficulty in walking, not elsewhere classified        Subjective     Social Support/Occupation  Lives in: multiple level home  Lives with: adult children  Occupation: retired     Lonn reports feeling good since last appointment and practicing sit to stands at home.     PmHx: S/p transcatheter aortic valve replacement.      Precautions: cardiac  Allergies: Patient has no known allergies.    Objective   Initial Evaluation Reference and/or Current Measurements(as dated):    Balance   Left   Eyes Open (Left)(sec)      Trial 1: 1 seconds     Trial 2: 1 seconds     Trial 3: 1 seconds               10/19/19   11/02/19     Average: 1 seconds       0 sec      1 sec  Right   Eyes Open (Right)(sec)      Trial 1: 2 seconds     Trial 2: 2 seconds     Trial 3: 1 seconds               10/19/19   11/02/19     Average: 1.67 seconds        0 sec      1 sec    Initial R 10/26/19  R LE Strength  MMT /5 Initial  L 10/26/19  L   4 4 Hip Flexion(L1/2) 4- 4   3+ 4 Hip Extension 4 4-   4  Hip Abduction 4    4+  Knee Flexion (S1) 4+    5  Knee Extension (L3) 5    5  Ankle DF (L4) 5      Toe Ext (L5)     (blank fields were intentionally left blank)    Range of Motion: (degrees)     Right  AROM 8/31     Left AROM 8/31 InitialLeft PROM   Left AROM   Left PROM       Hip Flexion       5     IR 15      30    ER 10      108    Knee flex 110      Lacking 10    Knee ext Lacking  12                                       (blank fields were intentionally left blank)    HS length  L 55 deg  R 40 deg    TUG  11/06/19 - AD=26.5 sec no AD =22 sec                     Treatment     Therapeutic Exercises - Justified to address any of the following: develop strength, endurance, ROM and/or flexibility.  Subjective updated    Neuromuscular Re-Education - Justified to address any of the following: of movement, balance, coordination, kinesthetic sense, posture and/or proprioception for sitting and/or standing activities.   TUG with and w/o AD    Standing on FOAM PAD  -BALANCE: B march 2 x 15 reps v/tc for high knees    Therapeutic Activity - Justified to address the following: activities to improve functional performance.  Standing // bars  -Straddle stepping on [employer]eb[employer] step with BUE dependence 2 x 10 reps  - 6" Step ups HHA BUE with PTA 2 x 10 reps  -Stand to sit with no UE support x10 reps v/tc for posture and glute  -STS with no UE and adductor ball squeeze x 10 reps vc for posture  -BHR 2  x10 reps    Home Exercises   Access Code: [email or web address]  URL: [email or web address]  Date: 10/13/2019  Prepared by: Anabel Halon    Exercises  Mini Squat with Chair - 1 x daily - 7 x weekly - 3 sets - 10 reps  Standing Gluteal Sets - 1 x daily - 7 x weekly - 2 sets - 10 reps  Standing Hip Extension with Counter Support - 1 x daily - 7 x weekly - 2 sets - 10 reps  Standing Heel Raise - 1 x daily - 7 x weekly - 2 sets - 10 reps       ---      ---   Total Time   Timed Minutes  38 minutes   Total Time  38 minutes        Assessment   Todays session worked to address limitations with BLE strength and promote dynamic balance. Pt expresses fatigue; requires rest breaks; pt states he doesn't know why he feels "so shot today". Pt demonstrates STS and gait with WBOS able to decrease BOS with adductor ball squeeze with STS. Pt unable to perform straddle stepping or step ups without UE assist today. Pt requires cues for posture with all exercise, post treatment pt observed to ambulate with improved posture. Pt would benefit from continued skilled care to improve BLE strength and balance for  decreased fall risk.    Plan   BLE strengthening  Balance DL   HS stretching  Core engagement        Goals    Goal 1: Patient will demonstrate independence in prescribed HEP with proper form, sets and reps for safe discharge to an independent program.  10/19/19: Reports compliance with HEP.        Sessions: 20      Goal 2: Patient will demonstrate 4+/5 MMT hip extension strength to facilitate ambulation for 30 minutes to perform community ambulation without excessive effort  9/921: Progressing. Tested in standing 4-/5 L, 4/5 R  -ST       Sessions: 20      Goal 3: Pt will be able to perform Single leg balance greater than 10 seconds with eyes open to walk on grass and gravel surfaces and perform LE dressing standing safely   10/19/19: Unable to tol SLS- reduced ankle strategy.-ST     Sessions: 20      Goal 4: Patient will perform 10 consecutive squats with 10 lbs load to facilitate lifting objects for IADLS in his home without excessive effort.  10/26/19: Progressing. Tol consecutive sit to stand x 10. -ST       Sessions: 20  Laureen Ochs, LPTA

## 2019-11-09 ENCOUNTER — Inpatient Hospital Stay: Payer: Medicare Other | Admitting: Physical Therapist

## 2019-11-09 DIAGNOSIS — R262 Difficulty in walking, not elsewhere classified: Secondary | ICD-10-CM

## 2019-11-09 DIAGNOSIS — M6281 Muscle weakness (generalized): Secondary | ICD-10-CM

## 2019-11-09 NOTE — PT/OT Therapy Note (Signed)
Name: Lynell Kussman Age: 86 y.o.   Date of Service: 11/09/2019  Referring Physician: Erasmo Score, MD   Date of Injury: 08/23/2019  Date Care Plan Established/Reviewed: 10/09/2019  Date Treatment Started: 10/09/2019  End of Certification Date: 01/06/2020  Sessions in Plan of Care: 20  Surgery Date: 07/12/2019    Visit Count: 8   Diagnosis:   1. Muscle weakness (generalized)    2. Difficulty in walking, not elsewhere classified        Subjective     Social Support/Occupation  Lives in: multiple level home  Lives with: adult children  Occupation: retired     Aiven reports he feels like he has not seen a mountain of change but he is slowly getting better.     PmHx: S/p transcatheter aortic valve replacement.      Precautions: cardiac  Allergies: Patient has no known allergies.    Objective   Initial Evaluation Reference and/or Current Measurements(as dated):    Balance   Left   Eyes Open (Left)(sec)      Trial 1: 1 seconds     Trial 2: 1 seconds     Trial 3: 1 seconds               10/19/19   11/02/19     Average: 1 seconds       0 sec      1 sec  Right   Eyes Open (Right)(sec)      Trial 1: 2 seconds     Trial 2: 2 seconds     Trial 3: 1 seconds               10/19/19   11/02/19     Average: 1.67 seconds        0 sec      1 sec    Initial R 10/26/19  R LE Strength  MMT /5 Initial  L 10/26/19  L   4 4 Hip Flexion(L1/2) 4- 4   3+ 4 Hip Extension 4 4-   4  Hip Abduction 4    4+  Knee Flexion (S1) 4+    5  Knee Extension (L3) 5    5  Ankle DF (L4) 5      Toe Ext (L5)     (blank fields were intentionally left blank)    Range of Motion: (degrees)     Right  AROM 8/31     Left AROM 8/31 InitialLeft PROM   Left AROM   Left PROM       Hip Flexion       5     IR 15      30    ER 10      108    Knee flex 110      Lacking 10    Knee ext Lacking  12                                       (blank fields were intentionally left blank)    HS length  L 55 deg  R 40 deg    TUG  11/06/19 - AD=26.5 sec no AD =22  sec                    Treatment     Therapeutic Exercises - Justified to address any of the following: develop  strength, endurance, ROM and/or flexibility.   Nustep x 5 min with subjective intake, treatment planning and equipment setup    Add leg press 2x15 with 30# VC for knee straight    Seated hip abd strengthening with RTB around knees 30x     Neuromuscular Re-Education - Justified to address any of the following: of movement, balance, coordination, kinesthetic sense, posture and/or proprioception for sitting and/or standing activities.   Sitting on FOAM PAD  -BALANCE: B march 2 x 15 reps v/tc for high knees  Balance: reach overhead with 1# weighted bar with EO/EC 2x15 ea  Lateral reach with Rpball for trunk SB and ab control 2x10 ea  With B feet on stool; slight stool slide out with small lean backwards in trunk x15 with therapist behind Pt for stability    Standing on foam pad in //:   With CGA A; pt punches overhead 2x15 B      Quadruped alt UE, knee lift 2x10 B ea for core engagement and trunk stability              Therapeutic Activity - Justified to address the following: activities to improve functional performance.  Not today    Standing // bars  -Straddle stepping on [employer] step with BUE dependence 2 x 10 reps  - 6" Step ups HHA BUE with PTA 2 x 10 reps  -Stand to sit with no UE support x10 reps v/tc for posture and glute  -STS with no UE and adductor ball squeeze x 10 reps vc for posture  -BHR 2  x10 reps    Home Exercises   Access Code: [email or web address]  URL: [email or web address]  Date: 10/13/2019  Prepared by: Anabel Halon    Exercises  Mini Squat with Chair - 1 x daily - 7 x weekly - 3 sets - 10 reps  Standing Gluteal Sets - 1 x daily - 7 x weekly - 2 sets - 10 reps  Standing Hip Extension with Counter Support - 1 x daily - 7 x weekly - 2 sets - 10 reps  Standing Heel Raise - 1 x daily - 7 x weekly - 2 sets - 10 reps       ---      ---   Total Time   Timed Minutes  42 minutes   Total Time  42  minutes        Assessment   Todays session worked on core stability and shifts outside of base of support in sitting for balance. Pt is challenged with seated and standing balance activities but is able to assume and perform activities in quadruped positioning. Initially before session he takes increased time to demo step through gait pattern but after session able to perform without warmup period. No adverse reactions to todays treatment session and pt will benefit from continued skilled PT to improve ambulation  Plan   BLE strengthening  Core engagement        Goals    Goal 1: Patient will demonstrate independence in prescribed HEP with proper form, sets and reps for safe discharge to an independent program.  10/19/19: Reports compliance with HEP.        Sessions: 20      Goal 2: Patient will demonstrate 4+/5 MMT hip extension strength to facilitate ambulation for 30 minutes to perform community ambulation without excessive effort  9/921: Progressing. Tested in standing 4-/5 L, 4/5 R  -ST       Sessions: 20  Goal 3: Pt will be able to perform Single leg balance greater than 10 seconds with eyes open to walk on grass and gravel surfaces and perform LE dressing standing safely   10/19/19: Unable to tol SLS- reduced ankle strategy.-ST     Sessions: 20      Goal 4: Patient will perform 10 consecutive squats with 10 lbs load to facilitate lifting objects for IADLS in his home without excessive effort.  10/26/19: Progressing. Tol consecutive sit to stand x 10. -ST       Sessions: 20                                  Blair Heys, DPT

## 2019-11-14 ENCOUNTER — Inpatient Hospital Stay: Payer: Medicare Other | Admitting: Professional

## 2019-11-14 DIAGNOSIS — M6281 Muscle weakness (generalized): Secondary | ICD-10-CM

## 2019-11-14 DIAGNOSIS — R262 Difficulty in walking, not elsewhere classified: Secondary | ICD-10-CM

## 2019-11-14 NOTE — PT/OT Therapy Note (Signed)
Name: Rick [name] Age: 86 y.o.   Date of Service: 11/14/2019  Referring Physician: Erasmo Score, MD   Date of Injury: 08/23/2019  Date Care Plan Established/Reviewed: 10/09/2019  Date Treatment Started: 10/09/2019  End of Certification Date: 01/06/2020  Sessions in Plan of Care: 20  Surgery Date: 07/12/2019    Visit Count: 9   Diagnosis:   1. Muscle weakness (generalized)    2. Difficulty in walking, not elsewhere classified        Subjective     Social Support/Occupation  Lives in: multiple level home  Lives with: adult children  Occupation: retired     I've been feeling about average, I'm a little sleepy, but I blame that on age. I should walk more, but I do walk around the house.    PmHx: S/p transcatheter aortic valve replacement.      Precautions: cardiac  Allergies: Patient has no known allergies.    Objective   Initial Evaluation Reference and/or Current Measurements(as dated):    Balance   Left   Eyes Open (Left)(sec)      Trial 1: 1 seconds     Trial 2: 1 seconds     Trial 3: 1 seconds               10/19/19   11/02/19     Average: 1 seconds       0 sec      1 sec  Right   Eyes Open (Right)(sec)      Trial 1: 2 seconds     Trial 2: 2 seconds     Trial 3: 1 seconds               10/19/19   11/02/19     Average: 1.67 seconds        0 sec      1 sec    Initial R 10/26/19  R LE Strength  MMT /5 Initial  L 10/26/19  L   4 4 Hip Flexion(L1/2) 4- 4   3+ 4 Hip Extension 4 4-   4  Hip Abduction 4    4+  Knee Flexion (S1) 4+    5  Knee Extension (L3) 5    5  Ankle DF (L4) 5      Toe Ext (L5)     (blank fields were intentionally left blank)    Range of Motion: (degrees)     Right  AROM 8/31     Left AROM 8/31 InitialLeft PROM   Left AROM   Left PROM       Hip Flexion       5     IR 15      30    ER 10      108    Knee flex 110      Lacking 10    Knee ext Lacking  12                                       (blank fields were intentionally left blank)    HS length  L 55 deg  R 40  deg    TUG  11/06/19 - AD=26.5 sec no AD =22 sec                    Treatment     Therapeutic Exercises - Justified to  address any of the following: develop strength, endurance, ROM and/or flexibility.   Ambulating with walking stick x 3 min with subjective intake, treatment planning and equipment setup    Leg press x10 reps 50# and 2x15 with 70# v/tc for quad and glute engagement    Not Today:  Seated hip abd strengthening with RTB around knees 30x     Neuromuscular Re-Education - Justified to address any of the following: of movement, balance, coordination, kinesthetic sense, posture and/or proprioception for sitting and/or standing activities.   Sitting on FOAM PAD  -BALANCE: B march 2# x v/tc for high knees  -Balance: LAQ 2#  x10 reps ea the x15 reps ea alternating  -Balance: reach overhead with 3# weighted bar with EO/EC x15 reps ea    Not Today  -Lateral reach with Rpball for trunk SB and ab control 2x10 ea  With B feet on stool; slight stool slide out with small lean backwards in trunk x15 with therapist behind Pt for stability  -Standing on foam pad in //:   With CGA A; pt punches overhead 2x15 B      Therapeutic Activity - Justified to address the following: activities to improve functional performance.  Gait with SBA/CGA w and w/o AD vc for NBOS and stride length x 3 min    Gait with HHA between obstacles for decr step width x 5 min vc for NBOS and stride length    Not today  Standing // bars  -Straddle stepping on [employer]eb[employer] step with BUE dependence 2 x 10 reps  - 6" Step ups HHA BUE with PTA 2 x 10 reps  -Stand to sit with no UE support x10 reps v/tc for posture and glute  -STS with no UE and adductor ball squeeze x 10 reps vc for posture  -BHR 2  x10 reps    Home Exercises   Access Code: [email or web address]  URL: [email or web address]  Date: 10/13/2019  Prepared by: Anabel Halon    Exercises  Mini Squat with Chair - 1 x daily - 7 x weekly - 3 sets - 10 reps  Standing Gluteal Sets - 1 x daily - 7 x  weekly - 2 sets - 10 reps  Standing Hip Extension with Counter Support - 1 x daily - 7 x weekly - 2 sets - 10 reps  Standing Heel Raise - 1 x daily - 7 x weekly - 2 sets - 10 reps       ---      ---   Total Time   Timed Minutes  41 minutes   Total Time  41 minutes        Assessment   Todays session worked to improve deficits with LE strength with core NMR and promote decreased WBOS during gait.   Pt observed to ambulate with WBOS and decreased L step length compared to R; used varied verbal and visual cues to decrease step width and increase step length; pt had noted improvement with gait between FR on floor with HHA from PTA.  Pt demonstrated improving core activation and engagement in seated exercises, requires cues for posture and increased participation with PTA performance alongside or counting.   Pt would benefit from continued skilled care to progress gait and balance for increased walking tolerance and normalization of gait.   Plan   BLE strengthening  Core engagement        Goals    Goal 1: Patient will demonstrate independence in prescribed HEP with proper  form, sets and reps for safe discharge to an independent program.  10/19/19: Reports compliance with HEP.        Sessions: 20      Goal 2: Patient will demonstrate 4+/5 MMT hip extension strength to facilitate ambulation for 30 minutes to perform community ambulation without excessive effort  9/921: Progressing. Tested in standing 4-/5 L, 4/5 R  -ST       Sessions: 20      Goal 3: Pt will be able to perform Single leg balance greater than 10 seconds with eyes open to walk on grass and gravel surfaces and perform LE dressing standing safely   10/19/19: Unable to tol SLS- reduced ankle strategy.-ST     Sessions: 20      Goal 4: Patient will perform 10 consecutive squats with 10 lbs load to facilitate lifting objects for IADLS in his home without excessive effort.  10/26/19: Progressing. Tol consecutive sit to stand x 10. -ST       Sessions: 20                                   Eliberto Ivory, LPTA

## 2019-11-16 ENCOUNTER — Inpatient Hospital Stay: Payer: Medicare Other | Admitting: Physical Therapist

## 2019-11-16 DIAGNOSIS — M6281 Muscle weakness (generalized): Secondary | ICD-10-CM

## 2019-11-16 DIAGNOSIS — R262 Difficulty in walking, not elsewhere classified: Secondary | ICD-10-CM

## 2019-11-16 NOTE — PT/OT Therapy Note (Signed)
Name: Rick [name] Age: 86 y.o.   Date of Service: 11/16/2019  Referring Physician: Erasmo Score, MD   Date of Injury: 08/23/2019  Date Care Plan Established/Reviewed: 10/09/2019  Date Treatment Started: 10/09/2019  End of Certification Date: 01/06/2020  Sessions in Plan of Care: 20  Surgery Date: 07/12/2019    Visit Count: 10   Diagnosis:   1. Muscle weakness (generalized)    2. Difficulty in walking, not elsewhere classified        Subjective     Social Support/Occupation  Lives in: multiple level home  Lives with: adult children  Occupation: retired     I have been doing some crunches and lifting some weights.     PmHx: S/p transcatheter aortic valve replacement.      Precautions: cardiac  Allergies: Patient has no known allergies.    Objective   Initial Evaluation Reference and/or Current Measurements(as dated):    Balance   Left   Eyes Open (Left)(sec)      Trial 1: 1 seconds     Trial 2: 1 seconds     Trial 3: 1 seconds               10/19/19   11/02/19     Average: 1 seconds       0 sec      1 sec  Right   Eyes Open (Right)(sec)      Trial 1: 2 seconds     Trial 2: 2 seconds     Trial 3: 1 seconds               10/19/19   11/02/19     Average: 1.67 seconds        0 sec      1 sec    Initial R 10/26/19  R 9/30 R LE Strength  MMT /5 Initial  L 10/26/19  L 9/30 L   4 4  Hip Flexion(L1/2) 4- 4    3+ 4 4+ Hip Extension 4 4- 4   4   Hip Abduction 4     4+   Knee Flexion (S1) 4+     5   Knee Extension (L3) 5     5   Ankle DF (L4) 5        Toe Ext (L5)      (blank fields were intentionally left blank)    Range of Motion: (degrees)     Right  AROM 8/31     Left AROM 8/31 InitialLeft PROM   Left AROM   Left PROM       Hip Flexion       5     IR 15      30    ER 10      108    Knee flex 110      Lacking 10    Knee ext Lacking  12                                       (blank fields were intentionally left blank)    HS length  L 55 deg  R 40 deg    TUG  11/06/19 - AD=26.5 sec no AD =22 sec                     Treatment     Therapeutic Exercises -  Justified to address any of the following: develop strength, endurance, ROM and/or flexibility.   nustep x  5 min @2 .5 with subjective intake, treatment planning and equipment setup    Seated LAQ with 1# for quad strengthening 2 x 15    Measurements( see chart)     Leg press 2x15 with 70# v/tc for quad and glute engagement    Not Today:  Seated hip abd strengthening with RTB around knees 30x     Neuromuscular Re-Education - Justified to address any of the following: of movement, balance, coordination, kinesthetic sense, posture and/or proprioception for sitting and/or standing activities.   SL assessment for balance goal: x5 reps ea with close SBA  Standing on foam pad 2' ea     Sit<> stand for functional transfer balance  With varying heights at table 30x total with close SBA    Not today    Sitting on FOAM PAD  -BALANCE: B march 2# x v/tc for high knees  -Balance: LAQ 2#  x10 reps ea the x15 reps ea alternating  -Balance: reach overhead with 3# weighted bar with EO/EC x15 reps ea    Not Today  -Lateral reach with Rpball for trunk SB and ab control 2x10 ea  With B feet on stool; slight stool slide out with small lean backwards in trunk x15 with therapist behind Pt for stability  -Standing on foam pad in //:   With CGA A; pt punches overhead 2x15 B      Therapeutic Activity - Justified to address the following: activities to improve functional performance.  Not today    Gait with SBA/CGA w and w/o AD vc for NBOS and stride length x 3 min    Gait with HHA between obstacles for decr step width x 5 min vc for NBOS and stride length    Standing // bars  -Straddle stepping on [employer] step with BUE dependence 2 x 10 reps  - 6" Step ups HHA BUE with PTA 2 x 10 reps  -Stand to sit with no UE support x10 reps v/tc for posture and glute  -STS with no UE and adductor ball squeeze x 10 reps vc for posture  -BHR 2  x10 reps    Home Exercises   Access Code: [email or web address]  URL:  [email or web address]  Date: 10/13/2019  Prepared by: Anabel Halon    Exercises  Mini Squat with Chair - 1 x daily - 7 x weekly - 3 sets - 10 reps  Standing Gluteal Sets - 1 x daily - 7 x weekly - 2 sets - 10 reps  Standing Hip Extension with Counter Support - 1 x daily - 7 x weekly - 2 sets - 10 reps  Standing Heel Raise - 1 x daily - 7 x weekly - 2 sets - 10 reps       ---      ---   Total Time   Timed Minutes  40 minutes   Total Time  40 minutes        Assessment   Pt demos improvements in strength in BLE but is still limited overall in balance which is affecting his gait and efficiency for ambulation overall. He is able to perform sit to stands today well with varying heights at the table but does have increased effort with this. He continues with a stooped posture and decreased 1st heel rocker on L when ambulating. No adverse reactions to todays treatment session and pt will benefit from  continued skilled PT to improve gait.  Plan   BLE strengthening  Core engagement        Goals    Goal 1: Patient will demonstrate independence in prescribed HEP with proper form, sets and reps for safe discharge to an independent program.  10/19/19: Reports compliance with HEP       Sessions: 20      Goal 2: Patient will demonstrate 4+/5 MMT hip extension strength to facilitate ambulation for 30 minutes to perform community ambulation without excessive effort  9/921: Progressing. Tested in standing 4-/5 L, 4/5 R  -ST  9/30 R  4 L 4  Progressing SA   Sessions: 20      Goal 3: Pt will be able to perform Single leg balance greater than 10 seconds with eyes open to walk on grass and gravel surfaces and perform LE dressing standing safely   10/19/19: Unable to tol SLS- reduced ankle strategy.-ST  9/30 1 second B, contd SA     Sessions: 20      Goal 4: Patient will perform 10 consecutive squats with 10 lbs load to facilitate lifting objects for IADLS in his home without excessive effort.  10/26/19: Progressing. Tol consecutive  sit to stand x 10. -ST  9/30 can perform 10 squats to lowered table without load today but with slightly increased effort     Sessions: 20                                  Blair Heys, DPT

## 2019-11-16 NOTE — Progress Notes (Signed)
Name: Burnard Enis Age: 86 y.o.   Date of Service: 11/16/2019  Referring Physician: Erasmo Score, MD   Date of Injury: 08/23/2019  Date Care Plan Established/Reviewed: 10/09/2019  Date Treatment Started: 10/09/2019  End of Certification Date: 01/06/2020  Sessions in Plan of Care: 20  Surgery Date: 07/12/2019    Visit Count: 10   Diagnosis:   1. Muscle weakness (generalized)    2. Difficulty in walking, not elsewhere classified        Subjective     Social Support/Occupation  Lives in: multiple level home  Lives with: adult children  Occupation: retired     I have been doing some crunches and lifting some weights.     PmHx: S/p transcatheter aortic valve replacement.      Precautions: cardiac  Allergies: Patient has no known allergies.    Objective   Initial Evaluation Reference and/or Current Measurements(as dated):    Balance   Left   Eyes Open (Left)(sec)      Trial 1: 1 seconds     Trial 2: 1 seconds     Trial 3: 1 seconds               10/19/19   11/02/19     Average: 1 seconds       0 sec      1 sec  Right   Eyes Open (Right)(sec)      Trial 1: 2 seconds     Trial 2: 2 seconds     Trial 3: 1 seconds               10/19/19   11/02/19     Average: 1.67 seconds        0 sec      1 sec    Initial R 10/26/19  R 9/30 R LE Strength  MMT /5 Initial  L 10/26/19  L 9/30 L   4 4  Hip Flexion(L1/2) 4- 4    3+ 4 4+ Hip Extension 4 4- 4   4   Hip Abduction 4     4+   Knee Flexion (S1) 4+     5   Knee Extension (L3) 5     5   Ankle DF (L4) 5        Toe Ext (L5)      (blank fields were intentionally left blank)    Range of Motion: (degrees)     Right  AROM 8/31     Left AROM 8/31 InitialLeft PROM   Left AROM   Left PROM       Hip Flexion       5     IR 15      30    ER 10      108    Knee flex 110      Lacking 10    Knee ext Lacking  12                                       (blank fields were intentionally left blank)    HS length  L 55 deg  R 40 deg    TUG  11/06/19 - AD=26.5 sec no AD =22 sec                   Assessment   Pt demos improvements in strength in  BLE but is still limited overall in balance which is affecting his gait and efficiency for ambulation overall. He is able to perform sit to stands today well with varying heights at the table but does have increased effort with this. He continues with a stooped posture and decreased 1st heel rocker on L when ambulating. No adverse reactions to todays treatment session and pt will benefit from continued skilled PT to improve gait.    Patient has been seen for 10 visits in this case from 10/09/2019 until 11/16/2019. The primary encounter diagnosis was Muscle weakness (generalized). A diagnosis of Difficulty in walking, not elsewhere classified was also pertinent to this visit.  Patient has made good progress in strength and static balance since the initial evaluation. Patient is still demonstrating impairment in posture, range of motion, strength, joint mobility, static balance and dynamic balance. Patient would benefit from continued skilled PT intervention to address these deficits and achieve the below listed goals.    Plan   BLE strengthening  Core engagement        Goals    Goal 1: Patient will demonstrate independence in prescribed HEP with proper form, sets and reps for safe discharge to an independent program.  10/19/19: Reports compliance with HEP.        Sessions: 20      Goal 2: Patient will demonstrate 4+/5 MMT hip extension strength to facilitate ambulation for 30 minutes to perform community ambulation without excessive effort  9/921: Progressing. Tested in standing 4-/5 L, 4/5 R  -ST  9/30 R  4 L 4  Progressing SA     Sessions: 20      Goal 3: Pt will be able to perform Single leg balance greater than 10 seconds with eyes open to walk on grass and gravel surfaces and perform LE dressing standing safely   10/19/19: Unable to tol SLS- reduced ankle strategy.-ST    1 second B, contd SA   Sessions: 20      Goal 4: Patient will perform 10 consecutive  squats with 10 lbs load to facilitate lifting objects for IADLS in his home without excessive effort.  10/26/19: Progressing. Tol consecutive sit to stand x 10. -ST  9/30 can perform 10 squats to lowered table without load today but with slightly increased effort       Sessions: 20                                  Blair Heys, DPT

## 2019-11-17 ENCOUNTER — Inpatient Hospital Stay: Payer: Medicare Other | Admitting: Rehabilitative and Restorative Service Providers"

## 2019-11-22 ENCOUNTER — Inpatient Hospital Stay
Payer: Medicare Other | Attending: Student in an Organized Health Care Education/Training Program | Admitting: Rehabilitative and Restorative Service Providers"

## 2019-11-22 DIAGNOSIS — M6281 Muscle weakness (generalized): Secondary | ICD-10-CM | POA: Insufficient documentation

## 2019-11-22 DIAGNOSIS — R262 Difficulty in walking, not elsewhere classified: Secondary | ICD-10-CM | POA: Insufficient documentation

## 2019-11-22 NOTE — PT/OT Therapy Note (Signed)
Name: Rick [name] Age: 86 y.o.   Date of Service: 11/22/2019  Referring Physician: Erasmo Score, MD   Date of Injury: 08/23/2019  Date Care Plan Established/Reviewed: 10/09/2019  Date Treatment Started: 10/09/2019  End of Certification Date: 01/06/2020  Sessions in Plan of Care: 20  Surgery Date: 07/12/2019    Visit Count: 11   Diagnosis:   1. Muscle weakness (generalized)    2. Difficulty in walking, not elsewhere classified        Subjective     Social Support/Occupation  Lives in: multiple level home  Lives with: adult children  Occupation: retired     Patient reports that he is feeling ok. He reports that he can feel tired after 3 pm but is uncertain why. Patient reports that he has noted minor improvements in his walking and balance.       Precautions: cardiac  Allergies: Patient has no known allergies.    Objective   Initial Evaluation Reference and/or Current Measurements(as dated):    Balance   Left   Eyes Open (Left)(sec)      Trial 1: 1 seconds     Trial 2: 1 seconds     Trial 3: 1 seconds               10/19/19   11/02/19     Average: 1 seconds       0 sec      1 sec  Right   Eyes Open (Right)(sec)      Trial 1: 2 seconds     Trial 2: 2 seconds     Trial 3: 1 seconds               10/19/19   11/02/19     Average: 1.67 seconds        0 sec      1 sec    Initial R 10/26/19  R 9/30 R LE Strength  MMT /5 Initial  L 10/26/19  L 9/30 L   4 4  Hip Flexion(L1/2) 4- 4    3+ 4 4+ Hip Extension 4 4- 4   4   Hip Abduction 4     4+   Knee Flexion (S1) 4+     5   Knee Extension (L3) 5     5   Ankle DF (L4) 5        Toe Ext (L5)      (blank fields were intentionally left blank)    Range of Motion: (degrees)     Right  AROM 8/31     Left AROM 8/31 InitialLeft PROM   Left AROM   Left PROM       Hip Flexion       5     IR 15      30    ER 10      108    Knee flex 110      Lacking 10    Knee ext Lacking  12                                       (blank fields were intentionally left blank)    HS  length  L 55 deg  R 40 deg    TUG  11/06/19 - AD=26.5 sec no AD =22 sec  Treatment     Therapeutic Exercises - Justified to address any of the following: develop strength, endurance, ROM and/or flexibility.   HEP reviewed to ensure patient compliance.     Stretching to B hamstrings in sitting.     B gastrocnemius stretch with strap 1 min x 2 with verbal cuing to bring ankle into more dorsiflexion.     Neuromuscular Re-Education - Justified to address any of the following: of movement, balance, coordination, kinesthetic sense, posture and/or proprioception for sitting and/or standing activities.   Standing weight shifting 20 x on each B LE with verbal cuing to maintain neutral lumbar spine     Standing B hip abduction holding on to table 20 x with verbal cuing to maintain neutral lumbar spine     Standing on foam pad 2 min x 2 with verbal cuing to avoid increased lateral trunk sway     Therapeutic Activity - Justified to address the following: activities to improve functional performance.  Reviewed sitting<>standing Patient educated on how to hip hinge and avoiding increased use of B UEs when going from sit<>stand.     Home Exercises   Access Code: [email or web address]  URL: [email or web address]  Date: 10/13/2019  Prepared by: Anabel Halon    Exercises  Mini Squat with Chair - 1 x daily - 7 x weekly - 3 sets - 10 reps  Standing Gluteal Sets - 1 x daily - 7 x weekly - 2 sets - 10 reps  Standing Hip Extension with Counter Support - 1 x daily - 7 x weekly - 2 sets - 10 reps  Standing Heel Raise - 1 x daily - 7 x weekly - 2 sets - 10 reps       ---      ---   Total Time   Timed Minutes  38 minutes   Total Time  38 minutes        Assessment   Pt is noted to have decreased B knee extension when standing. Patient also has difficulty when performing sit<>stand for a prolonged period of time. Standing balance was addressed but patient had difficulty performing on the foam pad with both his feet on the floor.  Patient would benefit from further stretching to his B hamstrings and with additional balance exercises.   Plan   Keep working on standing balance.         Goals    Goal 1: Patient will demonstrate independence in prescribed HEP with proper form, sets and reps for safe discharge to an independent program.  10/19/19: Reports compliance with HEP       Sessions: 20      Goal 2: Patient will demonstrate 4+/5 MMT hip extension strength to facilitate ambulation for 30 minutes to perform community ambulation without excessive effort  9/921: Progressing. Tested in standing 4-/5 L, 4/5 R  -ST  9/30 R  4 L 4  Progressing SA   Sessions: 20      Goal 3: Pt will be able to perform Single leg balance greater than 10 seconds with eyes open to walk on grass and gravel surfaces and perform LE dressing standing safely   10/19/19: Unable to tol SLS- reduced ankle strategy.-ST  9/30 1 second B, contd SA     Sessions: 20      Goal 4: Patient will perform 10 consecutive squats with 10 lbs load to facilitate lifting objects for IADLS in his home without excessive effort.  10/26/19: Progressing. Tol consecutive sit to stand  x 10. -ST  9/30 can perform 10 squats to lowered table without load today but with slightly increased effort     Sessions: 20                                  Francis Dowse, DPT

## 2019-11-29 ENCOUNTER — Inpatient Hospital Stay: Payer: Medicare Other | Admitting: Rehabilitative and Restorative Service Providers"

## 2019-11-29 DIAGNOSIS — M6281 Muscle weakness (generalized): Secondary | ICD-10-CM

## 2019-11-29 DIAGNOSIS — R262 Difficulty in walking, not elsewhere classified: Secondary | ICD-10-CM

## 2019-11-29 NOTE — PT/OT Therapy Note (Signed)
Name: Rick [name] Age: 86 y.o.   Date of Service: 11/29/2019  Referring Physician: Erasmo Score, MD   Date of Injury: 08/23/2019  Date Care Plan Established/Reviewed: 10/09/2019  Date Treatment Started: 10/09/2019  End of Certification Date: 01/06/2020  Sessions in Plan of Care: 20  Surgery Date: 07/12/2019    Visit Count: 12   Diagnosis:   1. Muscle weakness (generalized)    2. Difficulty in walking, not elsewhere classified        Subjective     Social Support/Occupation  Lives in: multiple level home  Lives with: adult children  Occupation: retired     Patient reports feeling pretty good. He feels like things aren't getting a lot better with PT and he feels about the same. When he initially gets up to walk for the first 5-10 steps, he feels unsteady but not to the point of falling.       Precautions: cardiac  Allergies: Patient has no known allergies.    Objective   Initial Evaluation Reference and/or Current Measurements(as dated):    Balance   Left   Eyes Open (Left)(sec)      Trial 1: 1 seconds     Trial 2: 1 seconds     Trial 3: 1 seconds               10/19/19   11/02/19     Average: 1 seconds       0 sec      1 sec  Right   Eyes Open (Right)(sec)      Trial 1: 2 seconds     Trial 2: 2 seconds     Trial 3: 1 seconds               10/19/19   11/02/19     Average: 1.67 seconds        0 sec      1 sec    Initial R 10/26/19  R 9/30 R LE Strength  MMT /5 Initial  L 10/26/19  L 9/30 L   4 4  Hip Flexion(L1/2) 4- 4    3+ 4 4+ Hip Extension 4 4- 4   4   Hip Abduction 4     4+   Knee Flexion (S1) 4+     5   Knee Extension (L3) 5     5   Ankle DF (L4) 5        Toe Ext (L5)      (blank fields were intentionally left blank)    Range of Motion: (degrees)     Right  AROM 8/31     Left AROM 8/31 InitialLeft PROM   Left AROM   Left PROM       Hip Flexion       5     IR 15      30    ER 10      108    Knee flex 110      Lacking 10    Knee ext Lacking  12                                       (blank  fields were intentionally left blank)    HS length  L 55 deg  R 40 deg    TUG  11/06/19 - AD=26.5 sec no AD =22 sec  Treatment     Therapeutic Exercises - Justified to address any of the following: develop strength, endurance, ROM and/or flexibility.   Nu-step x5' with subjective   Stretching to B hamstrings in sitting    Updated HEP with handout given and reviewed        Neuromuscular Re-Education - Justified to address any of the following: of movement, balance, coordination, kinesthetic sense, posture and/or proprioception for sitting and/or standing activities.   Tandem stance attempted 15" ea side x4 in // bars, CGA with min UE assist    Tandem walking in // bars x5 laps with CGA and cueing for upright posture     Side stepping with blue TB around ankles with min UE assist using // bar x6 laps    Heel walking x3 laps forward and x3 laps backwards, CGA, cueing for upright posture     Toes walking     Therapeutic Activity - Justified to address the following: activities to improve functional performance.  TUG x10 rounds with 14 sec best time, CGA and cueing for safety and control    Home Exercises   Access Code: [email or web address]  URL: [email or web address]  Date: 10/13/2019  Prepared by: Anabel Halon    Exercises  Mini Squat with Chair - 1 x daily - 7 x weekly - 3 sets - 10 reps  Standing Gluteal Sets - 1 x daily - 7 x weekly - 2 sets - 10 reps  Standing Hip Extension with Counter Support - 1 x daily - 7 x weekly - 2 sets - 10 reps  Standing Heel Raise - 1 x daily - 7 x weekly - 2 sets - 10 reps       ---      ---   Total Time   Timed Minutes  40 minutes   Total Time  40 minutes        Assessment   Pt demonstrates good tolerance to today's session focused on static and dynamic balance as well as improving his transition with seated to standing to walking. TUG practiced with focus on faster initiation of safe walking when transitioning from seated to standing and walking to improve gait  steadiness. Session also focused on improving his balance with narrowing BOS. Updated HEP to further progress his balance and strength. Plan to continue and progress per tolerance and POC.   Plan   Keep working on standing balance.         Goals    Goal 1: Patient will demonstrate independence in prescribed HEP with proper form, sets and reps for safe discharge to an independent program.  10/19/19: Reports compliance with HEP       Sessions: 20      Goal 2: Patient will demonstrate 4+/5 MMT hip extension strength to facilitate ambulation for 30 minutes to perform community ambulation without excessive effort  9/921: Progressing. Tested in standing 4-/5 L, 4/5 R  -ST  9/30 R  4 L 4  Progressing SA   Sessions: 20      Goal 3: Pt will be able to perform Single leg balance greater than 10 seconds with eyes open to walk on grass and gravel surfaces and perform LE dressing standing safely   10/19/19: Unable to tol SLS- reduced ankle strategy.-ST  9/30 1 second B, contd SA     Sessions: 20      Goal 4: Patient will perform 10 consecutive squats with 10 lbs load to facilitate lifting objects for IADLS in his  home without excessive effort.  10/26/19: Progressing. Tol consecutive sit to stand x 10. -ST  9/30 can perform 10 squats to lowered table without load today but with slightly increased effort     Sessions: 20                                  Eston Mould, DPT

## 2019-12-01 ENCOUNTER — Inpatient Hospital Stay: Payer: Medicare Other | Admitting: Rehabilitative and Restorative Service Providers"

## 2019-12-01 DIAGNOSIS — M6281 Muscle weakness (generalized): Secondary | ICD-10-CM

## 2019-12-01 DIAGNOSIS — R262 Difficulty in walking, not elsewhere classified: Secondary | ICD-10-CM

## 2019-12-01 NOTE — PT/OT Therapy Note (Signed)
Name: Rick [name] Age: 86 y.o.   Date of Service: 12/01/2019  Referring Physician: Erasmo Score, MD   Date of Injury: 08/23/2019  Date Care Plan Established/Reviewed: 10/09/2019  Date Treatment Started: 10/09/2019  End of Certification Date: 01/06/2020  Sessions in Plan of Care: 20  Surgery Date: 07/12/2019    Visit Count: 13   Diagnosis:   1. Muscle weakness (generalized)    2. Difficulty in walking, not elsewhere classified        Subjective     Social Support/Occupation  Lives in: multiple level home  Lives with: adult children  Occupation: retired     Patient states he wasn't sore after the last session and did the HEP and felt it afterwards.       Precautions: cardiac  Allergies: Patient has no known allergies.    Objective   Initial Evaluation Reference and/or Current Measurements(as dated):    Balance   Left   Eyes Open (Left)(sec)      Trial 1: 1 seconds     Trial 2: 1 seconds     Trial 3: 1 seconds               10/19/19   11/02/19     Average: 1 seconds       0 sec      1 sec  Right   Eyes Open (Right)(sec)      Trial 1: 2 seconds     Trial 2: 2 seconds     Trial 3: 1 seconds               10/19/19   11/02/19     Average: 1.67 seconds        0 sec      1 sec    Initial R 10/26/19  R 9/30 R 12/01/19  R LE Strength  MMT /5 Initial  L 10/26/19  L 9/30 L 12/01/19   4 4   4+ Hip Flexion(L1/2) 4- 4  4   3+ 4 4+ 4+ Hip Extension 4 4- 4 4   4   4  Hip Abduction 4   4   4+   5 Knee Flexion (S1) 4+   5   5   5  Knee Extension (L3) 5   5   5   5  Ankle DF (L4) 5   5       Toe Ext (L5)       (blank fields were intentionally left blank)    Range of Motion: (degrees)     Right  AROM 8/31     Left AROM 8/31 InitialLeft PROM   Left AROM   Left PROM       Hip Flexion       5     IR 15      30    ER 10      108    Knee flex 110      Lacking 10    Knee ext Lacking  12                                       (blank fields were intentionally left blank)    HS length  L 55 deg  R 40 deg    TUG  11/06/19 -  AD=26.5 sec no AD =22 sec  Treatment     Therapeutic Exercises - Justified to address any of the following: develop strength, endurance, ROM and/or flexibility.   Nu-step x5' with subjective   Stretching to B hamstrings in sitting    Assessed ROM    Supine bridges 15x2, vc to glute squeeze prior to lift     Stair assessment     Neuromuscular Re-Education - Justified to address any of the following: of movement, balance, coordination, kinesthetic sense, posture and/or proprioception for sitting and/or standing activities.   Step ups onto 8" step using 1 HR x10 ea side, CGA, cueing for upright posture     Wobble board F/B x3 min and S/S x3 min    Heel walking x3 laps forward and x3 laps backwards, CGA, cueing for upright posture     Toes walking x3 laps forward and 3 laps backwards, CGA, bilat UE assist    Therapeutic Activity - Justified to address the following: activities to improve functional performance.  TUG x10 rounds with 14 sec best time, CGA and cueing for safety and control    Home Exercises   Access Code: [email or web address]  URL: [email or web address]  Date: 10/13/2019  Prepared by: Anabel Halon    Exercises  Mini Squat with Chair - 1 x daily - 7 x weekly - 3 sets - 10 reps  Standing Gluteal Sets - 1 x daily - 7 x weekly - 2 sets - 10 reps  Standing Hip Extension with Counter Support - 1 x daily - 7 x weekly - 2 sets - 10 reps  Standing Heel Raise - 1 x daily - 7 x weekly - 2 sets - 10 reps       ---      ---   Total Time   Timed Minutes  41 minutes   Total Time  41 minutes        Assessment   Session continues to focus on addressing pt's unsteadiness with gait and static standing. He continues to have hip weakness R>L, and is I with stairs using cane and 1 HR with step-to gait pattern. L knee continues to lack TKE, contributing to his poor static and dynamic balance. Plan to continue and progress per pt tolerance and POC.   Plan   Keep working on standing balance.         Goals    Goal  1: Patient will demonstrate independence in prescribed HEP with proper form, sets and reps for safe discharge to an independent program.  10/19/19: Reports compliance with HEP       Sessions: 20      Goal 2: Patient will demonstrate 4+/5 MMT hip extension strength to facilitate ambulation for 30 minutes to perform community ambulation without excessive effort  9/921: Progressing. Tested in standing 4-/5 L, 4/5 R  -ST  9/30 R  4 L 4  Progressing SA   Sessions: 20      Goal 3: Pt will be able to perform Single leg balance greater than 10 seconds with eyes open to walk on grass and gravel surfaces and perform LE dressing standing safely   10/19/19: Unable to tol SLS- reduced ankle strategy.-ST  9/30 1 second B, contd SA     Sessions: 20      Goal 4: Patient will perform 10 consecutive squats with 10 lbs load to facilitate lifting objects for IADLS in his home without excessive effort.  10/26/19: Progressing. Tol consecutive sit to stand x 10. -ST  9/30 can perform  10 squats to lowered table without load today but with slightly increased effort     Sessions: 20                                  Eston Mould, DPT

## 2019-12-05 ENCOUNTER — Inpatient Hospital Stay: Payer: Medicare Other | Admitting: Rehabilitative and Restorative Service Providers"

## 2019-12-05 DIAGNOSIS — R262 Difficulty in walking, not elsewhere classified: Secondary | ICD-10-CM

## 2019-12-05 DIAGNOSIS — M6281 Muscle weakness (generalized): Secondary | ICD-10-CM

## 2019-12-05 NOTE — PT/OT Therapy Note (Signed)
Name: Rick [name] Age: 86 y.o.   Date of Service: 12/05/2019  Referring Physician: Erasmo Score, MD   Date of Injury: 08/23/2019  Date Care Plan Established/Reviewed: 10/09/2019  Date Treatment Started: 10/09/2019  End of Certification Date: 01/06/2020  Sessions in Plan of Care: 20  Surgery Date: 07/12/2019    Visit Count: 14   Diagnosis:   1. Muscle weakness (generalized)    2. Difficulty in walking, not elsewhere classified        Subjective     Social Support/Occupation  Lives in: multiple level home  Lives with: adult children  Occupation: retired     Patient states he continues to feel wobbly when he first gets up and starts to walk.      Precautions: cardiac  Allergies: Patient has no known allergies.    Objective   Initial Evaluation Reference and/or Current Measurements(as dated):    Balance   Left   Eyes Open (Left)(sec)      Trial 1: 1 seconds     Trial 2: 1 seconds     Trial 3: 1 seconds               10/19/19   11/02/19     Average: 1 seconds       0 sec      1 sec  Right   Eyes Open (Right)(sec)      Trial 1: 2 seconds     Trial 2: 2 seconds     Trial 3: 1 seconds               10/19/19   11/02/19     Average: 1.67 seconds        0 sec      1 sec    Initial R 10/26/19  R 9/30 R 12/01/19  R LE Strength  MMT /5 Initial  L 10/26/19  L 9/30 L 12/01/19   4 4   4+ Hip Flexion(L1/2) 4- 4  4   3+ 4 4+ 4+ Hip Extension 4 4- 4 4   4   4  Hip Abduction 4   4   4+   5 Knee Flexion (S1) 4+   5   5   5  Knee Extension (L3) 5   5   5   5  Ankle DF (L4) 5   5       Toe Ext (L5)       (blank fields were intentionally left blank)    Range of Motion: (degrees)     Right  AROM 8/31     Left AROM 8/31 InitialLeft PROM   Left AROM   Left PROM       Hip Flexion       5     IR 15      30    ER 10      108    Knee flex 110      Lacking 10    Knee ext Lacking  12                                       (blank fields were intentionally left blank)    HS length  L 55 deg  R 40 deg    TUG  11/06/19 - AD=26.5 sec  no AD =22 sec  Treatment     Therapeutic Exercises - Justified to address any of the following: develop strength, endurance, ROM and/or flexibility.   Bike  x5' with subjective   Stretching to B hamstrings in sitting    Leg press bilat 60# 15x2, cueing for knee mechanics     Seated adductor squeeze using piliates loopx20     Neuromuscular Re-Education - Justified to address any of the following: of movement, balance, coordination, kinesthetic sense, posture and/or proprioception for sitting and/or standing activities.   Wobble board F/B x3 min and S/S x3 min with min UE assist attempted throughout     STS with adductor squeeze x6 with slight knee pain so deferred     Forward hurdles x4 laps with CGA and 1 HHA, cueing for clearance     Walking marches x15', HHA and CGA     Tandem walking attempted x15', CGA HHA, cueing for sequencing     Sideways hurdles x2 laps, CGA 1 HHA    Home Exercises   Access Code: [email or web address]  URL: [email or web address]  Date: 10/13/2019  Prepared by: Anabel Halon    Exercises  Mini Squat with Chair - 1 x daily - 7 x weekly - 3 sets - 10 reps  Standing Gluteal Sets - 1 x daily - 7 x weekly - 2 sets - 10 reps  Standing Hip Extension with Counter Support - 1 x daily - 7 x weekly - 2 sets - 10 reps  Standing Heel Raise - 1 x daily - 7 x weekly - 2 sets - 10 reps       ---      ---   Total Time   Timed Minutes  39 minutes   Total Time  39 minutes        Assessment   Pt continues to have step sequencing initiation trouble with first few steps and poor clearance, which was addressed today with pt responding to cueing for larger steps and increasing his foot clearance. Session continues to focus on improving his LE strength, and static and dynamic balance to improve his safety during all mobility. Plan to continue and progress per pt tolerance and POC.   Plan   Keep working on standing balance.         Goals    Goal 1: Patient will demonstrate independence in prescribed HEP  with proper form, sets and reps for safe discharge to an independent program.  10/19/19: Reports compliance with HEP       Sessions: 20      Goal 2: Patient will demonstrate 4+/5 MMT hip extension strength to facilitate ambulation for 30 minutes to perform community ambulation without excessive effort  9/921: Progressing. Tested in standing 4-/5 L, 4/5 R  -ST  9/30 R  4 L 4  Progressing SA   Sessions: 20      Goal 3: Pt will be able to perform Single leg balance greater than 10 seconds with eyes open to walk on grass and gravel surfaces and perform LE dressing standing safely   10/19/19: Unable to tol SLS- reduced ankle strategy.-ST  9/30 1 second B, contd SA     Sessions: 20      Goal 4: Patient will perform 10 consecutive squats with 10 lbs load to facilitate lifting objects for IADLS in his home without excessive effort.  10/26/19: Progressing. Tol consecutive sit to stand x 10. -ST  9/30 can perform 10 squats to lowered table without load today but with slightly  increased effort     Sessions: 20                                  Eston Mould, DPT

## 2019-12-08 ENCOUNTER — Inpatient Hospital Stay: Payer: Medicare Other | Admitting: Rehabilitative and Restorative Service Providers"

## 2019-12-08 DIAGNOSIS — R262 Difficulty in walking, not elsewhere classified: Secondary | ICD-10-CM

## 2019-12-08 DIAGNOSIS — M6281 Muscle weakness (generalized): Secondary | ICD-10-CM

## 2019-12-08 NOTE — PT/OT Therapy Note (Signed)
Name: Bond Grieshop Age: 86 y.o.   Date of Service: 12/08/2019  Referring Physician: Erasmo Score, MD   Date of Injury: 08/23/2019  Date Care Plan Established/Reviewed: 10/09/2019  Date Treatment Started: 10/09/2019  End of Certification Date: 01/06/2020  Sessions in Plan of Care: 20  Surgery Date: 07/12/2019    Visit Count: 15   Diagnosis:   1. Muscle weakness (generalized)    2. Difficulty in walking, not elsewhere classified        Subjective     Social Support/Occupation  Lives in: multiple level home  Lives with: adult children  Occupation: retired     Patient reports that everything is feeling pretty good. Patient notes a slight improvement in his walking and that he is holding less onto rails and other items. He still worries about tripping.      Precautions: cardiac  Allergies: Patient has no known allergies.    Objective   Initial Evaluation Reference and/or Current Measurements(as dated):    Balance   Left   Eyes Open (Left)(sec)      Trial 1: 1 seconds     Trial 2: 1 seconds     Trial 3: 1 seconds               10/19/19   11/02/19     Average: 1 seconds       0 sec      1 sec  Right   Eyes Open (Right)(sec)      Trial 1: 2 seconds     Trial 2: 2 seconds     Trial 3: 1 seconds               10/19/19   11/02/19     Average: 1.67 seconds        0 sec      1 sec    Initial R 10/26/19  R 9/30 R 12/01/19  R LE Strength  MMT /5 Initial  L 10/26/19  L 9/30 L 12/01/19   4 4   4+ Hip Flexion(L1/2) 4- 4  4   3+ 4 4+ 4+ Hip Extension 4 4- 4 4   4   4  Hip Abduction 4   4   4+   5 Knee Flexion (S1) 4+   5   5   5  Knee Extension (L3) 5   5   5   5  Ankle DF (L4) 5   5       Toe Ext (L5)       (blank fields were intentionally left blank)    Range of Motion: (degrees)     Right  AROM 8/31     Left AROM 8/31 InitialLeft PROM   Left AROM   Left PROM       Hip Flexion       5     IR 15      30    ER 10      108    Knee flex 110      Lacking 10    Knee ext Lacking  12                                        (blank fields were intentionally left blank)    HS length  L 55 deg  R 40 deg    TUG  11/06/19 - AD=26.5 sec  no AD =22 sec                    Treatment     Therapeutic Exercises - Justified to address any of the following: develop strength, endurance, ROM and/or flexibility.   HEP reviewed to ensure patient compliance.     Stretching to B hamstrings in sitting.     Gastrocnemius stretch with strap 1 min x 2 with verbal cuing to bring ankle into more dorsiflexion.     Standing heel raises 30 x with verbal cuing to distribute weight evenly on B LEs    Neuromuscular Re-Education - Justified to address any of the following: of movement, balance, coordination, kinesthetic sense, posture and/or proprioception for sitting and/or standing activities.   Standing B hip abduction with 2# holding on to table 20 x with verbal cuing to maintain neutral lumbar spine     Standing marches with 2# on each side 20 x with verbal cuing to engage core muscles    Standing on foam pad 2 min x 2 with verbal cuing to avoid increased lateral trunk sway       Therapeutic Activity - Justified to address the following: activities to improve functional performance.  Reviewed sitting<>standing Patient educated on how to hip hinge and avoiding increased use of B UEs when going from sit<>stand.       Home Exercises   Access Code: [email or web address]  URL: [email or web address]  Date: 10/13/2019  Prepared by: Anabel Halon    Exercises  Mini Squat with Chair - 1 x daily - 7 x weekly - 3 sets - 10 reps  Standing Gluteal Sets - 1 x daily - 7 x weekly - 2 sets - 10 reps  Standing Hip Extension with Counter Support - 1 x daily - 7 x weekly - 2 sets - 10 reps  Standing Heel Raise - 1 x daily - 7 x weekly - 2 sets - 10 reps       ---      ---   Total Time    Timed Minutes 38 minutes   Total Time 38 minutes        Assessment   Pt is noted to have decreased B knee extension when standing. Patient also has difficulty when performing sit<>stand for a  prolonged period of time. Standing balance was addressed but patient had difficulty performing on the foam pad with both his feet on the floor. Patient would benefit from further stretching to his B hamstrings and with additional balance exercises.   Plan   Speak with patient about possible discharge.       Goals    Goal 1: Patient will demonstrate independence in prescribed HEP with proper form, sets and reps for safe discharge to an independent program.  10/19/19: Reports compliance with HEP       Sessions: 20      Goal 2: Patient will demonstrate 4+/5 MMT hip extension strength to facilitate ambulation for 30 minutes to perform community ambulation without excessive effort  9/921: Progressing. Tested in standing 4-/5 L, 4/5 R  -ST  9/30 R  4 L 4  Progressing SA   Sessions: 20      Goal 3: Pt will be able to perform Single leg balance greater than 10 seconds with eyes open to walk on grass and gravel surfaces and perform LE dressing standing safely   10/19/19: Unable to tol SLS- reduced ankle strategy.-ST  9/30 1 second B, contd SA  Sessions: 20      Goal 4: Patient will perform 10 consecutive squats with 10 lbs load to facilitate lifting objects for IADLS in his home without excessive effort.  10/26/19: Progressing. Tol consecutive sit to stand x 10. -ST  9/30 can perform 10 squats to lowered table without load today but with slightly increased effort     Sessions: 20                                  Francis Dowse, DPT

## 2019-12-12 ENCOUNTER — Inpatient Hospital Stay: Payer: Medicare Other | Admitting: Rehabilitative and Restorative Service Providers"

## 2019-12-12 DIAGNOSIS — R262 Difficulty in walking, not elsewhere classified: Secondary | ICD-10-CM

## 2019-12-12 DIAGNOSIS — M6281 Muscle weakness (generalized): Secondary | ICD-10-CM

## 2019-12-12 NOTE — PT/OT Therapy Note (Signed)
Name: Rick [name] Age: 86 y.o.   Date of Service: 12/12/2019  Referring Physician: Erasmo Score, MD   Date of Injury: 08/23/2019  Date Care Plan Established/Reviewed: 10/09/2019  Date Treatment Started: 10/09/2019  End of Certification Date: 01/06/2020  Sessions in Plan of Care: 20  Surgery Date: 07/12/2019    Visit Count: 16   Diagnosis:   1. Muscle weakness (generalized)    2. Difficulty in walking, not elsewhere classified        Subjective     Social Support/Occupation  Lives in: multiple level home  Lives with: adult children  Occupation: retired     Patient reports not doing as much of the exercises this weekend but did a little. He is feeling good today.       Precautions: cardiac  Allergies: Patient has no known allergies.    Objective   Initial Evaluation Reference and/or Current Measurements(as dated):    Balance   Left   Eyes Open (Left)(sec)      Trial 1: 1 seconds     Trial 2: 1 seconds     Trial 3: 1 seconds               10/19/19   11/02/19     Average: 1 seconds       0 sec      1 sec  Right   Eyes Open (Right)(sec)      Trial 1: 2 seconds     Trial 2: 2 seconds     Trial 3: 1 seconds               10/19/19   11/02/19     Average: 1.67 seconds        0 sec      1 sec    Initial R 10/26/19  R 9/30 R 12/01/19  R LE Strength  MMT /5 Initial  L 10/26/19  L 9/30 L 12/01/19   4 4   4+ Hip Flexion(L1/2) 4- 4  4   3+ 4 4+ 4+ Hip Extension 4 4- 4 4   4   4  Hip Abduction 4   4   4+   5 Knee Flexion (S1) 4+   5   5   5  Knee Extension (L3) 5   5   5   5  Ankle DF (L4) 5   5       Toe Ext (L5)       (blank fields were intentionally left blank)    Range of Motion: (degrees)     Right  AROM 8/31     Left AROM 8/31 InitialLeft PROM   Left AROM   Left PROM       Hip Flexion       5     IR 15      30    ER 10      108    Knee flex 110      Lacking 10    Knee ext Lacking  12                                       (blank fields were intentionally left blank)    HS length  L 55 deg  R 40  deg    TUG  11/06/19 - AD=26.5 sec no AD =22 sec  Treatment     Therapeutic Exercises - Justified to address any of the following: develop strength, endurance, ROM and/or flexibility.   Bike x5' with subjective intake to determine today's tx     Seated hip adduction with pilates loop x25    Seated HS stretch bilat with strap 25"x2 ea side     Leg press bilat recumbent position 75# x25, cueing for control and full ROM     Neuromuscular Re-Education - Justified to address any of the following: of movement, balance, coordination, kinesthetic sense, posture and/or proprioception for sitting and/or standing activities.   Walking marches with 2# ankle weights 15'x4, cueing for control and higher knee height     Clinic walking without an AD to improve stability and safety     Narrow BOS on foam pad without UE assist, CGA hold for as long as able    Hip width BOS on foam pad with chest press using 5# KB x10, CGA for balance     Therapeutic Activity - Justified to address the following: activities to improve functional performance.  Clinic walking without AD with emphasis to improve stability and safety     Home Exercises   Access Code: [email or web address]  URL: [email or web address]  Date: 10/13/2019  Prepared by: Anabel Halon    Exercises  Mini Squat with Chair - 1 x daily - 7 x weekly - 3 sets - 10 reps  Standing Gluteal Sets - 1 x daily - 7 x weekly - 2 sets - 10 reps  Standing Hip Extension with Counter Support - 1 x daily - 7 x weekly - 2 sets - 10 reps  Standing Heel Raise - 1 x daily - 7 x weekly - 2 sets - 10 reps       ---      ---   Total Time    Timed Minutes 44 minutes   Total Time 44 minutes        Assessment   Session continues to address pt's static and dynamic balance to improve his functional mobility and safety to reduce his risk for falls. He continues to fatigue quickly and demo a wide BOS during all ambulation and static standing. He continues to lack TKE bilaterally contributing to  some unsteadiness during gait. Plan to continue and progress per pt tolerance and POC.   Plan   Speak with patient about possible discharge.       Goals    Goal 1: Patient will demonstrate independence in prescribed HEP with proper form, sets and reps for safe discharge to an independent program.  10/19/19: Reports compliance with HEP       Sessions: 20      Goal 2: Patient will demonstrate 4+/5 MMT hip extension strength to facilitate ambulation for 30 minutes to perform community ambulation without excessive effort  9/921: Progressing. Tested in standing 4-/5 L, 4/5 R  -ST  9/30 R  4 L 4  Progressing SA   Sessions: 20      Goal 3: Pt will be able to perform Single leg balance greater than 10 seconds with eyes open to walk on grass and gravel surfaces and perform LE dressing standing safely   10/19/19: Unable to tol SLS- reduced ankle strategy.-ST  9/30 1 second B, contd SA     Sessions: 20      Goal 4: Patient will perform 10 consecutive squats with 10 lbs load to facilitate lifting objects for IADLS in his home without excessive effort.  10/26/19: Progressing. Tol consecutive sit to stand x 10. -ST  9/30 can perform 10 squats to lowered table without load today but with slightly increased effort     Sessions: 20                                  Eston Mould, DPT

## 2019-12-14 ENCOUNTER — Inpatient Hospital Stay: Payer: Medicare Other | Admitting: Rehabilitative and Restorative Service Providers"

## 2019-12-14 DIAGNOSIS — M6281 Muscle weakness (generalized): Secondary | ICD-10-CM

## 2019-12-14 DIAGNOSIS — R262 Difficulty in walking, not elsewhere classified: Secondary | ICD-10-CM

## 2019-12-14 NOTE — PT/OT Therapy Note (Signed)
Name: Rick [name] Age: 86 y.o.   Date of Service: 12/14/2019  Referring Physician: Erasmo Score, MD   Date of Injury: 08/23/2019  Date Care Plan Established/Reviewed: 10/09/2019  Date Treatment Started: 10/09/2019  End of Certification Date: 01/06/2020  Sessions in Plan of Care: 20  Surgery Date: 07/12/2019    Visit Count: 17   Diagnosis:   1. Muscle weakness (generalized)    2. Difficulty in walking, not elsewhere classified        Subjective     Social Support/Occupation  Lives in: multiple level home  Lives with: adult children  Occupation: retired     Patient states that overall he is feeling better since starting PT. He does note that his walking is still shaky and he has difficulty going from sitting to standing after sitting down for a prolonged period of time.       Precautions: cardiac  Allergies: Patient has no known allergies.    Objective   Initial Evaluation Reference and/or Current Measurements(as dated):    Balance   Left   Eyes Open (Left)(sec)      Trial 1: 1 seconds     Trial 2: 1 seconds     Trial 3: 1 seconds               10/19/19   11/02/19     Average: 1 seconds       0 sec      1 sec  Right   Eyes Open (Right)(sec)      Trial 1: 2 seconds     Trial 2: 2 seconds     Trial 3: 1 seconds               10/19/19   11/02/19     Average: 1.67 seconds        0 sec      1 sec    Initial R 10/26/19  R 9/30 R 12/01/19  R LE Strength  MMT /5 Initial  L 10/26/19  L 9/30 L 12/01/19   4 4   4+ Hip Flexion(L1/2) 4- 4  4   3+ 4 4+ 4+ Hip Extension 4 4- 4 4   4   4  Hip Abduction 4   4   4+   5 Knee Flexion (S1) 4+   5   5   5  Knee Extension (L3) 5   5   5   5  Ankle DF (L4) 5   5       Toe Ext (L5)       (blank fields were intentionally left blank)    Range of Motion: (degrees)     Right  AROM 8/31     Left AROM 8/31 InitialLeft PROM   Left AROM   Left PROM       Hip Flexion       5     IR 15      30    ER 10      108    Knee flex 110      Lacking 10    Knee ext Lacking  12                                        (blank fields were intentionally left blank)    HS length  L 55 deg  R 40 deg  TUG  11/06/19 - AD=26.5 sec no AD =22 sec                    Treatment     Therapeutic Exercises - Justified to address any of the following: develop strength, endurance, ROM and/or flexibility.   Patient educated on his condition.    Manual stretching to his B hamstrings in sitting    Sitting to standing 20 x with verbal cuing to maintain appropriate BOS    Neuromuscular Re-Education - Justified to address any of the following: of movement, balance, coordination, kinesthetic sense, posture and/or proprioception for sitting and/or standing activities.   Standing B hip abduction with 3# holding on to table 30 x with verbal cuing to maintain neutral lumbar spine     Standing marches with 3# on each side 30 x with verbal cuing to engage core muscles    Standing on foam pad 2 min x 3 with verbal cuing to avoid increased lateral trunk sway     Gait Training - Justified to address any of the following:  Ambulation including weight bearing variation, gait sequencing, instruction/progression of an assistive device, and/or stair climbing.    Practiced ambulating with gait belt and no AD with verbal cuing to intiate heel strike and improve hip flexion. Performed multiple times across the room.     Home Exercises   Access Code: [email or web address]  URL: [email or web address]  Date: 10/13/2019  Prepared by: Anabel Halon    Exercises  Mini Squat with Chair - 1 x daily - 7 x weekly - 3 sets - 10 reps  Standing Gluteal Sets - 1 x daily - 7 x weekly - 2 sets - 10 reps  Standing Hip Extension with Counter Support - 1 x daily - 7 x weekly - 2 sets - 10 reps  Standing Heel Raise - 1 x daily - 7 x weekly - 2 sets - 10 reps       ---      ---   Total Time    Timed Minutes 38 minutes   Total Time 38 minutes        Assessment   Pt is noted to have decreased B knee extension when standing. Patient also has difficulty when performing  sit<>stand for a prolonged period of time. Standing balance was addressed but patient had difficulty performing on the foam pad with both his feet on the floor. Patient had unsteady gait when not using his AFD and requires contact guarding when ambulating, Patient would benefit from further stretching to his B hamstrings and with additional balance exercises.   Plan   Keep performing weightbearing exercises with patient.       Goals    Goal 1: Patient will demonstrate independence in prescribed HEP with proper form, sets and reps for safe discharge to an independent program.  10/19/19: Reports compliance with HEP       Sessions: 20      Goal 2: Patient will demonstrate 4+/5 MMT hip extension strength to facilitate ambulation for 30 minutes to perform community ambulation without excessive effort  9/921: Progressing. Tested in standing 4-/5 L, 4/5 R  -ST  9/30 R  4 L 4  Progressing SA   Sessions: 20      Goal 3: Pt will be able to perform Single leg balance greater than 10 seconds with eyes open to walk on grass and gravel surfaces and perform LE dressing standing safely   10/19/19: Unable to tol  SLS- reduced ankle strategy.-ST  9/30 1 second B, contd SA     Sessions: 20      Goal 4: Patient will perform 10 consecutive squats with 10 lbs load to facilitate lifting objects for IADLS in his home without excessive effort.  10/26/19: Progressing. Tol consecutive sit to stand x 10. -ST  9/30 can perform 10 squats to lowered table without load today but with slightly increased effort     Sessions: 20                                  Francis Dowse, DPT

## 2019-12-20 ENCOUNTER — Inpatient Hospital Stay
Payer: Medicare Other | Attending: Student in an Organized Health Care Education/Training Program | Admitting: Rehabilitative and Restorative Service Providers"

## 2019-12-20 DIAGNOSIS — M6281 Muscle weakness (generalized): Secondary | ICD-10-CM | POA: Insufficient documentation

## 2019-12-20 DIAGNOSIS — R262 Difficulty in walking, not elsewhere classified: Secondary | ICD-10-CM | POA: Insufficient documentation

## 2019-12-20 NOTE — Progress Notes (Signed)
Name: Rick [name] Age: 86 y.o.   Date of Service: 12/20/2019  Referring Physician: Erasmo Score, MD   Date of Injury: 08/23/2019  Date Care Plan Established/Reviewed: 10/09/2019  Date Treatment Started: 10/09/2019  End of Certification Date: 01/06/2020  Sessions in Plan of Care: 20  Surgery Date: 07/12/2019    Visit Count: 18   Diagnosis:   1. Muscle weakness (generalized)    2. Difficulty in walking, not elsewhere classified        Subjective     Social Support/Occupation  Lives in: multiple level home  Lives with: adult children  Occupation: retired     Patient states that his back is hurting him today. Per son, he was given tylenol that sometimes helps and sometimes doesn't. Per son, pt sat at a table for hours yesterday without getting up or moving and he thinks this may be why his back his hurting him.       Precautions: cardiac  Allergies: Patient has no known allergies.    Objective   Initial Evaluation Reference and/or Current Measurements(as dated):    Balance   Left   Eyes Open (Left)(sec)      Trial 1: 1 seconds     Trial 2: 1 seconds     Trial 3: 1 seconds               10/19/19   11/02/19     Average: 1 seconds       0 sec      1 sec  Right   Eyes Open (Right)(sec)      Trial 1: 2 seconds     Trial 2: 2 seconds     Trial 3: 1 seconds               10/19/19   11/02/19     Average: 1.67 seconds        0 sec      1 sec    Initial R 10/26/19  R 9/30 R 12/01/19  R 12/20/19  R LE Strength  MMT /5 Initial  L 10/26/19  L 9/30 L 12/01/19 12/20/19  L   4 4  4+ 4+ Hip Flexion(L1/2) 4- 4  4 4    3+ 4 4+ 4+  Hip Extension 4 4- 4 4    4   4   Hip Abduction 4   4    4+   5 5 Knee Flexion (S1) 4+   5 5   5   5  4+ Knee Extension (L3) 5   5 5   5   5  4+ Ankle DF (L4) 5   5 5         Toe Ext (L5)        (blank fields were intentionally left blank)    Range of Motion: (degrees)     Right  AROM 8/31 12/20/19  Right  Left AROM 8/31 12/20/19  Left   Left AROM   Left PROM     Hip Flexion       5  20 IR 15 21      30 30  ER 10 35     108 110 Knee flex 110 110     Lacking 10 Lacking   10 Knee ext Lacking  12 lacking 12                                (blank fields  were intentionally left blank)    HS length  L 55 deg  R 40 deg    TUG  11/06/19 - AD=26.5 sec no AD =22 sec     Assessment   Patient has been seen for 18 visits in this case from 10/09/2019 until 12/20/2019. The primary encounter diagnosis was Muscle weakness (generalized). A diagnosis of Difficulty in walking, not elsewhere classified was also pertinent to this visit.  Patient has made good progress in pain, posture, range of motion and strength since the initial evaluation. Patient would benefit from continued skilled PT intervention to address these deficits and achieve the below listed goals.    Pt demonstrates decreased stability during gait today with need for HHA during walking with increased forward flexed posture. Session performed most activities on mat today due to increased LBP with no exacerbation of sx during mat exercises focused on glute and LE strengthening. PT discussed with son for pt to return to [facility] for today due to increased instability with gait. Plan to continue and progress per pt tolerance and POC.   Plan   Keep performing weightbearing exercises with patient.       Goals    Goal 1: Patient will demonstrate independence in prescribed HEP with proper form, sets and reps for safe discharge to an independent program.  10/19/19: Reports compliance with HEP  12/20/19: Reports compliance        Sessions: 20      Goal 2: Patient will demonstrate 4+/5 MMT hip extension strength to facilitate ambulation for 30 minutes to perform community ambulation without excessive effort  9/921: Progressing. Tested in standing 4-/5 L, 4/5 R  -ST  9/30 R  4 L 4  Progressing SA  12/20/19: Progressing see chart aw    Sessions: 20      Goal 3: Pt will be able to perform Single leg balance greater than 10 seconds with eyes open to walk on grass and gravel surfaces and  perform LE dressing standing safely   10/19/19: Unable to tol SLS- reduced ankle strategy.-ST  9/30 1 second B, contd SA  12/20/19: Goal not assessed today due to LBP     Sessions: 20      Goal 4: Patient will perform 10 consecutive squats with 10 lbs load to facilitate lifting objects for IADLS in his home without excessive effort.  10/26/19: Progressing. Tol consecutive sit to stand x 10. -ST  9/30 can perform 10 squats to lowered table without load today but with slightly increased effort    12/20/19: Goal not assessed today due to pain    Sessions: 20                                  Eston Mould, DPT

## 2019-12-20 NOTE — PT/OT Therapy Note (Signed)
Name: Rick [name] Age: 86 y.o.   Date of Service: 12/20/2019  Referring Physician: Erasmo Score, MD   Date of Injury: 08/23/2019  Date Care Plan Established/Reviewed: 10/09/2019  Date Treatment Started: 10/09/2019  End of Certification Date: 01/06/2020  Sessions in Plan of Care: 20  Surgery Date: 07/12/2019    Visit Count: 18   Diagnosis:   1. Muscle weakness (generalized)    2. Difficulty in walking, not elsewhere classified        Subjective     Social Support/Occupation  Lives in: multiple level home  Lives with: adult children  Occupation: retired     Patient states that his back is hurting him today. Per son, he was given tylenol that sometimes helps and sometimes doesn't. Per son, pt sat at a table for hours yesterday without getting up or moving and he thinks this may be why his back his hurting him.       Precautions: cardiac  Allergies: Patient has no known allergies.    Objective   Initial Evaluation Reference and/or Current Measurements(as dated):    Balance   Left   Eyes Open (Left)(sec)      Trial 1: 1 seconds     Trial 2: 1 seconds     Trial 3: 1 seconds               10/19/19   11/02/19     Average: 1 seconds       0 sec      1 sec  Right   Eyes Open (Right)(sec)      Trial 1: 2 seconds     Trial 2: 2 seconds     Trial 3: 1 seconds               10/19/19   11/02/19     Average: 1.67 seconds        0 sec      1 sec    Initial R 10/26/19  R 9/30 R 12/01/19  R 12/20/19  R LE Strength  MMT /5 Initial  L 10/26/19  L 9/30 L 12/01/19 12/20/19  L   4 4  4+ 4+ Hip Flexion(L1/2) 4- 4  4 4    3+ 4 4+ 4+  Hip Extension 4 4- 4 4    4   4   Hip Abduction 4   4    4+   5 5 Knee Flexion (S1) 4+   5 5   5   5  4+ Knee Extension (L3) 5   5 5   5   5  4+ Ankle DF (L4) 5   5 5         Toe Ext (L5)        (blank fields were intentionally left blank)    Range of Motion: (degrees)     Right  AROM 8/31 12/20/19  Right  Left AROM 8/31 12/20/19  Left   Left AROM   Left PROM     Hip Flexion       5  20 IR 15 21      30 30  ER 10 35     108 110 Knee flex 110 110     Lacking 10 Lacking   10 Knee ext Lacking  12 lacking 12                                (blank fields  were intentionally left blank)    HS length  L 55 deg  R 40 deg    TUG  11/06/19 - AD=26.5 sec no AD =22 sec                    Treatment     Therapeutic Exercises - Justified to address any of the following: develop strength, endurance, ROM and/or flexibility.   Nu-step x5' with subjective intake to determine today's tx     Updated ROM and MMT     LTR x20    Supine bridges with blue TB around thighs x25     Hooklying hip adduction with pilates loop x25, cueing for full ROM    Hooklying hip abduction with blue TB x25, vc/tc for full ROM    Neuromuscular Re-Education - Justified to address any of the following: of movement, balance, coordination, kinesthetic sense, posture and/or proprioception for sitting and/or standing activities.   Side stepping with blue TB around thighs bilat UE assist 10'x2    Seated marches with blue TB around knees x20     Home Exercises   Access Code: [email or web address]  URL: [email or web address]  Date: 10/13/2019  Prepared by: Anabel Halon    Exercises  Mini Squat with Chair - 1 x daily - 7 x weekly - 3 sets - 10 reps  Standing Gluteal Sets - 1 x daily - 7 x weekly - 2 sets - 10 reps  Standing Hip Extension with Counter Support - 1 x daily - 7 x weekly - 2 sets - 10 reps  Standing Heel Raise - 1 x daily - 7 x weekly - 2 sets - 10 reps       ---      ---   Total Time    Timed Minutes 40 minutes   Total Time 40 minutes        Assessment   Patient has been seen for 18 visits in this case from 10/09/2019 until 12/20/2019. The primary encounter diagnosis was Muscle weakness (generalized). A diagnosis of Difficulty in walking, not elsewhere classified was also pertinent to this visit.  Patient has made good progress in pain, posture, range of motion and strength since the initial evaluation. Patient would benefit from continued skilled PT  intervention to address these deficits and achieve the below listed goals.    Pt demonstrates decreased stability during gait today with need for HHA during walking with increased forward flexed posture. Session performed most activities on mat today due to increased LBP with no exacerbation of sx during mat exercises focused on glute and LE strengthening. PT discussed with son for pt to return to [facility] for today due to increased instability with gait. Plan to continue and progress per pt tolerance and POC.   Plan   Keep performing weightbearing exercises with patient.       Goals    Goal 1: Patient will demonstrate independence in prescribed HEP with proper form, sets and reps for safe discharge to an independent program.  10/19/19: Reports compliance with HEP  12/20/19: Reports compliance        Sessions: 20      Goal 2: Patient will demonstrate 4+/5 MMT hip extension strength to facilitate ambulation for 30 minutes to perform community ambulation without excessive effort  9/921: Progressing. Tested in standing 4-/5 L, 4/5 R  -ST  9/30 R  4 L 4  Progressing SA  12/20/19: Progressing see chart aw    Sessions: 20  Goal 3: Pt will be able to perform Single leg balance greater than 10 seconds with eyes open to walk on grass and gravel surfaces and perform LE dressing standing safely   10/19/19: Unable to tol SLS- reduced ankle strategy.-ST  9/30 1 second B, contd SA  12/20/19: Goal not assessed today due to LBP     Sessions: 20      Goal 4: Patient will perform 10 consecutive squats with 10 lbs load to facilitate lifting objects for IADLS in his home without excessive effort.  10/26/19: Progressing. Tol consecutive sit to stand x 10. -ST  9/30 can perform 10 squats to lowered table without load today but with slightly increased effort    12/20/19: Goal not assessed today due to pain    Sessions: 20                                  Eston Mould, DPT

## 2020-01-03 ENCOUNTER — Inpatient Hospital Stay: Payer: Medicare Other | Admitting: Rehabilitative and Restorative Service Providers"

## 2020-01-03 DIAGNOSIS — R262 Difficulty in walking, not elsewhere classified: Secondary | ICD-10-CM

## 2020-01-03 DIAGNOSIS — M6281 Muscle weakness (generalized): Secondary | ICD-10-CM

## 2020-01-03 NOTE — PT/OT Therapy Note (Addendum)
Name: Rick [name] Age: 86 y.o.   Date of Service: 01/03/2020  Referring Physician: Erasmo Score, MD   Date of Injury: 08/23/2019  Date Care Plan Established/Reviewed: 10/09/2019  Date Treatment Started: 10/09/2019  End of Certification Date: 01/06/2020  Sessions in Plan of Care: 20  Surgery Date: 07/12/2019    Visit Count: 19   Diagnosis:   1. Muscle weakness (generalized)    2. Difficulty in walking, not elsewhere classified      Discontinuation of Therapy Services    Rick [name] did not complete prescribed physical therapy visits.      Status is unknown at this time, physical therapy has been discontinued and patient has been discharged from care.  The last therapy note is below for review.    Please feel free to contact me with any questions regarding the care of Rick [name].    Sincerely,    Francis Dowse, DPT        02/07/2020          Subjective     Social Support/Occupation  Lives in: multiple level home  Lives with: adult children  Occupation: retired     Patient reports that his balance and walking are still difficult for him. He also says he has been constuipated the past couple fo days.       Precautions: cardiac  Allergies: Patient has no known allergies.                Treatment     Therapeutic Exercises - Justified to address any of the following: develop strength, endurance, ROM and/or flexibility.   HEP reviewed to ensure patient compliance.     Manual stretching to his B hamstrings in sitting.     Sitting R hamstring stretch 1 min x 3 with verbal cuing to avoid pulling past pain and discomfort     Neuromuscular Re-Education - Justified to address any of the following: of movement, balance, coordination, kinesthetic sense, posture and/or proprioception for sitting and/or standing activities.   Standing B hip abduction with 3# holding on to table 30 x with verbal cuing to maintain neutral lumbar spine     Standing marches with 3# on each side 30 x with verbal cuing  to engage core muscles    Standing on foam pad 2 min x 3 with verbal cuing to avoid increased lateral trunk sway     Therapeutic Activity - Justified to address the following: activities to improve functional performance.  Reviewed sitting<>standing Patient educated on how to hip hinge and avoiding increased use of B UEs when going from sit<>stand.        ---      ---   Total Time    Timed Minutes 38 minutes   Total Time 38 minutes        Assessment   Patient requires close supervision with all standing exercises secondary to fall risk. Patient is still noted to have significant B knee extension loss during standing activities. Today's treatment session focused on improving balance and B knee mobility.   Plan   Patient may possibly be discharged.       Goals    Goal 1: Patient will demonstrate independence in prescribed HEP with proper form, sets and reps for safe discharge to an independent program.  10/19/19: Reports compliance with HEP  12/20/19: Reports compliance        Sessions: 20      Goal 2: Patient will demonstrate 4+/5  MMT hip extension strength to facilitate ambulation for 30 minutes to perform community ambulation without excessive effort  9/921: Progressing. Tested in standing 4-/5 L, 4/5 R  -ST  9/30 R  4 L 4  Progressing SA  12/20/19: Progressing see chart aw    Sessions: 20      Goal 3: Pt will be able to perform Single leg balance greater than 10 seconds with eyes open to walk on grass and gravel surfaces and perform LE dressing standing safely   10/19/19: Unable to tol SLS- reduced ankle strategy.-ST  9/30 1 second B, contd SA  12/20/19: Goal not assessed today due to LBP     Sessions: 20      Goal 4: Patient will perform 10 consecutive squats with 10 lbs load to facilitate lifting objects for IADLS in his home without excessive effort.  10/26/19: Progressing. Tol consecutive sit to stand x 10. -ST  9/30 can perform 10 squats to lowered table without load today but with slightly increased effort     12/20/19: Goal not assessed today due to pain    Sessions: 20                                Francis Dowse, DPT

## 2020-01-23 ENCOUNTER — Encounter (INDEPENDENT_AMBULATORY_CARE_PROVIDER_SITE_OTHER): Payer: Self-pay | Admitting: Student in an Organized Health Care Education/Training Program

## 2020-04-23 ENCOUNTER — Encounter (INDEPENDENT_AMBULATORY_CARE_PROVIDER_SITE_OTHER): Payer: Self-pay | Admitting: Student in an Organized Health Care Education/Training Program

## 2020-05-01 ENCOUNTER — Ambulatory Visit (INDEPENDENT_AMBULATORY_CARE_PROVIDER_SITE_OTHER): Payer: Medicare Other | Admitting: Student in an Organized Health Care Education/Training Program

## 2020-05-01 ENCOUNTER — Encounter (INDEPENDENT_AMBULATORY_CARE_PROVIDER_SITE_OTHER): Payer: Self-pay | Admitting: Student in an Organized Health Care Education/Training Program

## 2020-05-01 VITALS — BP 115/60 | HR 59 | Temp 97.3°F | Ht 63.0 in | Wt 148.0 lb

## 2020-05-01 DIAGNOSIS — G8929 Other chronic pain: Secondary | ICD-10-CM

## 2020-05-01 DIAGNOSIS — R2681 Unsteadiness on feet: Secondary | ICD-10-CM

## 2020-05-01 DIAGNOSIS — M545 Low back pain, unspecified: Secondary | ICD-10-CM

## 2020-05-01 DIAGNOSIS — F0390 Unspecified dementia without behavioral disturbance: Secondary | ICD-10-CM

## 2020-05-01 DIAGNOSIS — R413 Other amnesia: Secondary | ICD-10-CM

## 2020-05-01 DIAGNOSIS — H6121 Impacted cerumen, right ear: Secondary | ICD-10-CM

## 2020-05-01 LAB — POCT URINALYSIS DIPSTIX (10)(MULTI-TEST)
Bilirubin, UA POCT: NEGATIVE
Blood, UA POCT: NEGATIVE
Glucose, UA POCT: NEGATIVE mg/dL
Ketones, UA POCT: NEGATIVE mg/dL
Nitrite, UA POCT: NEGATIVE
POCT Leukocytes, UA: NEGATIVE
POCT Spec Gravity, UA: 1.025 (ref 1.001–1.035)
POCT pH, UA: 6 (ref 5–8)
Protein, UA POCT: NEGATIVE mg/dL
Urobilinogen, UA: 0.2 mg/dL

## 2020-05-01 NOTE — Progress Notes (Signed)
Patient ID: Rick [name]  is a 87 y.o.  male.     Chief Complaint   Patient presents with    Back Pain    Dementia    Hearing Problem      Ear Fullness   There is pain in the right ear. This is a new problem. The current episode started in the past 7 days. The problem has been unchanged. There has been no fever. Associated symptoms include hearing loss. Pertinent negatives include no ear discharge. He has tried nothing for the symptoms.     Problem   Dementia Without Behavioral Disturbance, Unspecified Dementia Type    Treated by specialist in [place], currently lives with his son who is supportive, stable on memantine 20mg , no mood disorder/agitation, son noted more frequent episode of memory deficits in the past few months, to establish care with neuology    BP Readings from Last 3 Encounters:   05/01/20 115/60   10/09/19 149/74   08/25/19 115/80         Chronic Bilateral Low Back Pain Without Sciatica    Recent exacerbation of low back pain, no numbness, chronic generalized lower extremity weakness and unsteady gait, ambulates with walker/cane at home, improved with PT in the past and wants to resume therapy          The following portions of the patient's history were reviewed and updated as appropriate: current medications, allergies, past family history, past medical history, past social history, past surgical history and problem list.     Review of Systems   HENT: Positive for hearing loss. Negative for ear discharge.         See HPI    OBJECTIVE     BP 115/60    Pulse (!) 59    Temp 97.3 F (36.3 C) (Oral)    Ht 1.6 m (5\' 3" ) Comment: verbally   Wt 67.1 kg (148 lb) Comment: verbally   BMI 26.22 kg/m     Physical Exam    GENERAL : alert and in no distress   SKIN: warm and dry, w/o rash, normal turgor   Head: ncat  Neck: supple, no mass/thyromegaly/LAD, FROM  Eyes: PERRLA, EOMI  Ears: Right ear cerumen impaction, TMs clear following irrigation, hearing intact to finger rub  Nose: patent, no  turbinate swelling/discharge  Throat: normal oropharynx, no exudates/swelling  LUNGS: clear, no w/r/r  HEART: RRR normal S1 and S2  without gallop , murmur or rub  ABDOMEN  BS(+)  soft, non- tender , non- distended,  without organomegaly   PSYCH: Normal mood and affect, linear thought process, appropriate interactions, no SI/hallucinations      ASSESSMENT      Rick [name] is a 87 y.o. male with   1. Dementia without behavioral disturbance, unspecified dementia type  Neurology Referral: Lora Havens, MD ([place])    CBC without differential    Basic Metabolic Panel    Vitamin B12    Folate   2. Impacted cerumen of right ear     3. Chronic bilateral low back pain without sciatica  POCT UA Dipstix (10)(Multi-Test)    Referral to Physical Therapy-[facility]   4. Memory deficits     5. Unsteady gait  Referral to Physical Therapy-[facility]          PLAN     1. Dementia without behavioral disturbance, unspecified dementia type  -Rule out metabolic exacerbating factors, follow-up with neurology  - Neurology Referral: Rahul  Theodoro Grist, MD [facility])  - CBC without differential  - Basic Metabolic Panel  - Vitamin B12  - Folate    2. Impacted cerumen of right ear  -Clear with irrigation, no ear infection    3. Chronic bilateral low back pain without sciatica  - Encourage home stretching exercise, avoid mod-heavy lifting, start PT  - POCT UA Dipstix (10)(Multi-Test)  - Referral to Physical Therapy-[facility]    4. Memory deficits  See #1    5. Unsteady gait  - Referral to Physical Therapy-[facility]    Call if symptoms persist, worsen, or change.  Call with updates/questions/concerns.    F/u PRN    Medication list reviewed with patient and updated as indicated.  Risk & Benefits of any new medication(s) were explained to the patient who verbalized understanding & agreed to the treatment plan.  Patient given a printed copy of AVS. Patient verbalized understanding of instructions given.    This note was  generated by the Epic EMR system/ Dragon speech recognition and may contain inherent errors or omissions not intended by the user. Grammatical errors, random word insertions, deletions and pronoun errors  are occasional consequences of this technology due to software limitations. Not all errors are caught or corrected. If there are questions or concerns about the content of this note or information contained within the body of this dictation they should be addressed directly with the author for clarification

## 2020-05-01 NOTE — Progress Notes (Signed)
Have you seen any specialists/other providers since your last visit with us?    No    Arm preference verified?   Yes    The patient is due for influenza vaccine, shingles vaccine, pneumonia vaccine and MAWV

## 2020-05-06 ENCOUNTER — Ambulatory Visit (INDEPENDENT_AMBULATORY_CARE_PROVIDER_SITE_OTHER): Payer: Medicare Other | Admitting: Student in an Organized Health Care Education/Training Program

## 2020-06-04 ENCOUNTER — Encounter (INDEPENDENT_AMBULATORY_CARE_PROVIDER_SITE_OTHER): Payer: Self-pay | Admitting: Student in an Organized Health Care Education/Training Program

## 2020-06-05 ENCOUNTER — Inpatient Hospital Stay
Payer: Medicare Other | Attending: Student in an Organized Health Care Education/Training Program | Admitting: Rehabilitative and Restorative Service Providers"

## 2020-06-05 VITALS — BP 155/88

## 2020-06-05 DIAGNOSIS — M5451 Vertebrogenic low back pain: Secondary | ICD-10-CM

## 2020-06-05 DIAGNOSIS — R262 Difficulty in walking, not elsewhere classified: Secondary | ICD-10-CM | POA: Insufficient documentation

## 2020-06-05 DIAGNOSIS — R2689 Other abnormalities of gait and mobility: Secondary | ICD-10-CM | POA: Insufficient documentation

## 2020-06-05 NOTE — Progress Notes (Signed)
Name:Rick [name] Age: 87 y.o.   Date of Service: 06/05/2020  Referring Physician: Erasmo Score, MD   Date of Injury: 05/15/2020  Date Care Plan Established/Reviewed: 06/05/2020  Date Treatment Started: 06/05/2020  End of Certification Date: 09/02/2020  Sessions in Plan of Care: 16  Surgery Date: No data was found  MD Follow-up: No data was found  Medbridge Code: No data was found    Visit Count: 1   Diagnosis:   1. Difficulty in walking, not elsewhere classified    2. Balance disorder    3. Vertebrogenic low back pain        Subjective     History of Present Illness   History of Present Illness: (Patient is in the presence of his son). Patient reports that he is back to therapy to improve his balance his low back pain. He notes that this happened 3-4 weeks ago. Patient denies anything that could have triggered his back pain. Patient notes that the pain to his low back is dull, achy, and intermittent. Patient reports pain when going from sitting to standing, when walking, and standing for prolonged periods of time. Patient's son rep(orts that his father has been very sedentary at home. Patient states that he gets a relief in his symptoms when he stretches his lower back and when he takesTylenol and uses Bengay. Patient denies any numbness or tingling down his B LEs. Patient states that his pain has stayed about the same since it first happened. Patient denies any constitutional signs or symptoms. Patient's goals are to improve his walking.   Imaging: None seen in imaging section.   Past Medical History: Coronary Artery Disease, Dementia.   Past Surgical History: Look at medical history form.   Medications: Crestor, Tylenol, Colace.   Functional Limitations (PLOF): Patient is having difficulty negotiating one flight of stairs reciprocally. (Patient was able to negotiate one flight of stairs reciprocally).   Patient is having difficulty negotiating curbs and uneven surfaces. (Patient was able to negotiate curbs  and uneven surfaces).   Patient is having difficulty going from sitting to standing from a low surface. (Patient was able to go from sitting to standing from a low surface).     Outcome Measure   Tool Used/Details: Foto not obtained at time of documentation     Pain   Current pain rating: 6  At best pain rating: 0  At worst pain rating: 9  Location: L5-S1 spinous process    Social Support/Occupation  Lives in: multiple level home  Lives with: adult children  Occupation: retired       Precautions: No data was found  Allergies: Patient has no known allergies.    Past Medical History:   Diagnosis Date    Atrial enlargement, bilateral Echo result 04/04/19    CAD (coronary artery disease) 05/28/19 Cath results.    Severe 3V CAD.  Patent LIMA to LAD, patent SVG to OM 1 and OM 2 of circumflex CA, & patent SVG to RCA.    Chronic combined systolic and diastolic heart failure 310/21 OV Dr. Penny Pia    EF=30%    Dementia Mar 2016    Difficulty walking May 2018    Post surgery of lt knee, rt femur    Dyslipidemia 310/21 OV Dr. Penny Pia    Dyspnea on exertion Jan 2020    HTN (hypertension) 310/21 OV Dr. Penny Pia    Mild cognitive impairment 310/21 OV Dr. Penny Pia    Primary cardiomyopathy Oct 2019  RBBB 07/10/19 EKg result    S/P CABG (coronary artery bypass graft)      LIMA to LAD,  SVG to OM 1 & OM 2 of circumflex CA, & SVG to RCA.       Objective   Flattened lumbar lordosis, use of walking stick, and increased thoracic kyphosis.     Functional Strength   Sit to Stand: independent and UE assist    Balance   Left   Eyes Open (Left)(sec)      Trial 1: 1 seconds  Right   Eyes Open (Right)(sec)      Trial 1: 1 seconds    Integumentary   no wound, lesion or rash noted    Active Range of Motion (degrees)     Lumbar   Flexion: 60 degrees   Extension: 5 degrees with pain  Left lateral flexion: 23 degrees with pain  Right lateral flexion: 22 degrees with pain  Left rotation: 25 degrees   Right rotation: 25 degrees     Passive  Range of Motion (degrees)      Left Hip   Flexion: 95 degrees   External rotation (90/90): 24 degrees with pain  Internal rotation (90/90): 0 degrees with pain    Right Hip   Flexion: 100 degrees   External rotation (90/90): 35 degrees   Internal rotation (90/90): 5 degrees     Strength/Myotome Testing (/5)     Left Hip   Planes of Motion   Flexion (L1/2): 3-  External rotation: 3-    Right Hip   Planes of Motion   Flexion (L1/2): 3  External rotation: 3-    Left Knee   Flexion (S1): 5  Extension (L3): 3+    Right Knee   Flexion (S1): 5  Extension (L3): 3+    Neurological Testing     Sensation     Lumbar   Left   Intact: light touch    Right   Intact: light touch     BP: 155/88      Treatment     Therapeutic Exercises - Justified to address any of the following: develop strength, endurance, ROM and/or flexibility.   HEP reviewed to ensure patient compliance.     Sit to stands 30 x with verbal cuing to avoid increased BUEs       ---    Flowsheet Row ---   Total Time    Timed Minutes 10 minutes   Untimed Minutes 30 minutes   Total Time 40 minutes        Assessment   Rick [name] is a 87 y.o. male presenting with Gait and balance issues because of LBP who requires Physical Therapy for the impairments listed below.    Pain located: L5-S1 spinous process    Clinical presentation: stable - predictable recovery pattern  Barriers to therapy: Patient's age - difficulty following tasks  Time since onset of injury/illness/exacerbation - causing increased atrophy and resulting in multi-joint involvement  Mechanism of injury/illness/exacerbation effecting multiple body regions  Hearing deficit - decreased ability to follow verbal commands and reducing ability to comprehend commands    Impairments: IPTC Impairments: Decreased strength  Decreased/impaired motor control  Decreased coordination  Decreased static balance  Decreased dynamic balance  Functional Limitations (PLOF): Patient is having difficulty negotiating one flight of  stairs reciprocally. (Patient was able to negotiate one flight of stairs reciprocally).   Patient is having difficulty negotiating curbs and uneven surfaces. (Patient was able to negotiate curbs and  uneven surfaces).   Patient is having difficulty going from sitting to standing from a low surface. (Patient was able to go from sitting to standing from a low surface).   Prognosis: excellent  Patient is aware of diagnosis, prognosis and consents to plan of care: Yes  Plan   Visits per week: 2  Number of Sessions: 16  Direct One on One  16109: Therapeutic Exercise: To Develop Strength and Endurance, ROM and Flexibility  [id number]: Gait Training  60454: Neuromuscular Reeducation  97530: Therapeutic Activities: Dynamic activities to improve functional performance  Plan for next session: Focus on functional weightbearing exercises. Add a calf stretch.       Goals    Goal 1: Increase knee extension strength to a 5/5 so patient can safely transfer sit to stand.   Sessions: 12      Goal 2: Patient will demonstrate independence in prescribed HEP with proper form, sets and reps for safe discharge to an independent program.   Sessions: 16      Goal 3: Increase single leg stance to 15 seconds so patient can negotiate curbs and uneven surfaces safely.   Sessions: 10      Goal 4: Increase hip flexion strength to a 5/5 so patient can negotiate 1 flight(s) of stairs reciprocally.   Sessions: 12                                Francis Dowse, DPT

## 2020-06-10 ENCOUNTER — Inpatient Hospital Stay: Payer: Medicare Other | Admitting: Rehabilitative and Restorative Service Providers"

## 2020-06-10 DIAGNOSIS — R262 Difficulty in walking, not elsewhere classified: Secondary | ICD-10-CM

## 2020-06-10 DIAGNOSIS — R2689 Other abnormalities of gait and mobility: Secondary | ICD-10-CM

## 2020-06-10 DIAGNOSIS — M5451 Vertebrogenic low back pain: Secondary | ICD-10-CM

## 2020-06-10 NOTE — PT/OT Therapy Note (Signed)
Name: Eward Rutigliano Age: 87 y.o.   Date of Service: 06/10/2020  Referring Physician: Erasmo Score, MD   Date of Injury: 05/15/2020  Date Care Plan Established/Reviewed: 06/05/2020  Date Treatment Started: 06/05/2020  End of Certification Date: 09/02/2020  Sessions in Plan of Care: 16  Surgery Date: No data was found  MD Follow-up: No data was found  Medbridge Code: No data was found    Visit Count: 2   Diagnosis:   1. Balance disorder    2. Vertebrogenic low back pain    3. Difficulty in walking, not elsewhere classified        Subjective     History of Present Illness   History of Present Illness: Patient is having difficulty negotiating one flight of stairs reciprocally. (Patient was able to negotiate one flight of stairs reciprocally).   Patient is having difficulty negotiating curbs and uneven surfaces. (Patient was able to negotiate curbs and uneven surfaces).   Patient is having difficulty going from sitting to standing from a low surface. (Patient was able to go from sitting to standing from a low surface).     Outcome Measure   Tool Used/Details: Foto not obtained at time of documentation     Daily Subjective   ptis here with son Pt reports he feels unbalance while walking and on stairs. He denies difficulty with standing or stand up from sitting.    Social Support/Occupation  Lives in: multiple level home  Lives with: adult children  Occupation: retired       Precautions: No data was found  Allergies: Patient has no known allergies.              Active Range of Motion (degrees)     Lumbar   Flexion: 60 degrees   Extension: 5 degrees with pain  Left lateral flexion: 23 degrees with pain  Right lateral flexion: 22 degrees with pain  Left rotation: 25 degrees   Right rotation: 25 degrees     Passive Range of Motion (degrees)      Left Hip   Flexion: 95 degrees   External rotation (90/90): 24 degrees with pain  Internal rotation (90/90): 0 degrees with pain    Right Hip   Flexion: 100 degrees    External rotation (90/90): 35 degrees   Internal rotation (90/90): 5 degrees     Strength/Myotome Testing (/5)     Left Hip   Planes of Motion   Flexion (L1/2): 3-  External rotation: 3-    Right Hip   Planes of Motion   Flexion (L1/2): 3  External rotation: 3-    Left Knee   Flexion (S1): 5  Extension (L3): 3+    Right Knee   Flexion (S1): 5  Extension (L3): 3+  Treatment     Therapeutic Exercises - Justified to address any of the following: develop strength, endurance, ROM and/or flexibility.   - Nu step x 5 min for endurance and extension through LE  To activate posterior chain during subjective intake and tx planning     - sit to stands from armless chair c SBA x 10 reps with hands on knees. Prog to arms extended in front x 10 reps and VC for form and weight shift for balance and form  - HS stretch in chair 1x1' BIL  - calf stretch 2x30" on slant board     Neuromuscular Re-Education - Justified to address any of the following: of movement, balance, coordination, kinesthetic sense,  posture and/or proprioception for sitting and/or standing activities.   - NMR: church pews x 4 min with PT assist for posture and full trunk anterior lean vs hip hinge   - Balance: Marching in place x 3 min with 2 hands > 1 hands > 3 fingers on bar  - Balance:  placing one foot on step and alternating to balance during weight shift x 4 min  - standing tandem with 2 hands > 1 hand > no hands x 30 sec  - postural training in standing: weight shift forward, looking up, extending knees while standing and between exercises.      Gait Training - Justified to address any of the following:  Ambulation including weight bearing variation, gait sequencing, instruction/progression of an assistive device, and/or stair climbing.    Gait training from waiting room to Nu step, Nu step to // bars and to <> stairwell to // bars w SPC       ---    Flowsheet Row ---   Total Time    Timed Minutes 42 minutes   Total Time 42 minutes        Assessment    Pt exhibits sig difficulty with standing posture and ambulating with normal gait pattern. Pt has tendency to stand with very wide base, flex at knees, and trunk extension. While ambulating this is magnified with shuffling gait pattern. Pt has tendency to lean backwards and lose balance while rocking on heels during STS but responding to VC. Pt was challenged with narrow BOS balance exercises and fatigued with inc balance recruitment from multi hand support to 1 hand, to no little/no hand support. PT added posterior chain stretches as well for resting flexed LE positioning.     Goals    Goal 1: Increase knee extension strength to a 5/5 so patient can safely transfer sit to stand.   Sessions: 12      Goal 2: Patient will demonstrate independence in prescribed HEP with proper form, sets and reps for safe discharge to an independent program.   Sessions: 16      Goal 3: Increase single leg stance to 15 seconds so patient can negotiate curbs and uneven surfaces safely.   Sessions: 10      Goal 4: Increase hip flexion strength to a 5/5 so patient can negotiate 1 flight(s) of stairs reciprocally.   Sessions: 12                                Victorio Palm, DPT

## 2020-06-12 ENCOUNTER — Inpatient Hospital Stay: Payer: Medicare Other | Admitting: Rehabilitative and Restorative Service Providers"

## 2020-06-12 DIAGNOSIS — R2689 Other abnormalities of gait and mobility: Secondary | ICD-10-CM | POA: Insufficient documentation

## 2020-06-12 DIAGNOSIS — M5451 Vertebrogenic low back pain: Secondary | ICD-10-CM

## 2020-06-12 DIAGNOSIS — R262 Difficulty in walking, not elsewhere classified: Secondary | ICD-10-CM

## 2020-06-12 NOTE — PT/OT Therapy Note (Signed)
Name: Rick [name] Age: 87 y.o.   Date of Service: 06/12/2020  Referring Physician: Erasmo Score, MD   Date of Injury: 05/15/2020  Date Care Plan Established/Reviewed: 06/05/2020  Date Treatment Started: 06/05/2020  End of Certification Date: 09/02/2020  Sessions in Plan of Care: 16  Surgery Date: No data was found  MD Follow-up: No data was found  Medbridge Code: No data was found    Visit Count: 3   Diagnosis:   1. Balance disorder    2. Vertebrogenic low back pain    3. Difficulty in walking, not elsewhere classified        Subjective     History of Present Illness   History of Present Illness: Patient is having difficulty negotiating one flight of stairs reciprocally. (Patient was able to negotiate one flight of stairs reciprocally).   Patient is having difficulty negotiating curbs and uneven surfaces. (Patient was able to negotiate curbs and uneven surfaces).   Patient is having difficulty going from sitting to standing from a low surface. (Patient was able to go from sitting to standing from a low surface).     Outcome Measure   Tool Used/Details: Foto not obtained at time of documentation     Daily Subjective   Patient states that he feels his walking and balance is a little better sicne his last session. He reports that he has been doing these exercises at home.     Social Support/Occupation  Lives in: multiple level home  Lives with: adult children  Occupation: retired       Precautions: No data was found  Allergies: Patient has no known allergies.                  Active Range of Motion (degrees)     Lumbar   Flexion: 60 degrees   Extension: 5 degrees with pain  Left lateral flexion: 23 degrees with pain  Right lateral flexion: 22 degrees with pain  Left rotation: 25 degrees   Right rotation: 25 degrees     Passive Range of Motion (degrees)      Left Hip   Flexion: 95 degrees   External rotation (90/90): 24 degrees with pain  Internal rotation (90/90): 0 degrees with pain    Right Hip   Flexion:  100 degrees   External rotation (90/90): 35 degrees   Internal rotation (90/90): 5 degrees     Strength/Myotome Testing (/5)     Left Hip   Planes of Motion   Flexion (L1/2): 3-  External rotation: 3-    Right Hip   Planes of Motion   Flexion (L1/2): 3  External rotation: 3-    Left Knee   Flexion (S1): 5  Extension (L3): 3+    Right Knee   Flexion (S1): 5  Extension (L3): 3+  Treatment     Therapeutic Exercises - Justified to address any of the following: develop strength, endurance, ROM and/or flexibility.   HEP reviewed to ensure patient compliance.     Patient educated on injury.     Hamstring stretch in sitting with strap one min on each side with verbal cuing to avoid distal signs and symptoms.     Sit to stands with 5# kettlebell 2 x 10 with verbal cuing to maintain neutral lumbar pine      Neuromuscular Re-Education - Justified to address any of the following: of movement, balance, coordination, kinesthetic sense, posture and/or proprioception for sitting and/or standing activities.  SLS holding on to table 1 min x 2 on each side with verbal cuing to engage B hip/core muscles     Step Ups 20 x with verbal cuing to avoid increased use of B UE and to engage gluteal muscles     Standing for 3 mins with verbal cuing to avoid lateral sway      Gait Training - Justified to address any of the following:  Ambulation including weight bearing variation, gait sequencing, instruction/progression of an assistive device, and/or stair climbing.    Gait training from waiting room to treatment room. Obstacles were also set up for patient around the room and he was instructed to initiate foot clearance to avoid tripping.     Home Exercises   Access Code: [email or web address]  URL: [email or web address]  Date: 06/12/2020  Prepared by: Alyse Low    Exercises  Seated Hamstring Stretch with Strap - 1 x daily - 7 x weekly - 1 sets - 2 reps - 60secs hold  Goblet Squat with Kettlebell - 1 x daily - 7 x weekly - 3 sets - 10  reps  Step Up - 1 x daily - 7 x weekly - 3 sets - 10 reps  Standing Single Leg Stance with Counter Support - 1 x daily - 7 x weekly - 1 sets - 2 reps - 60secs hold         ---    Flowsheet Row ---   Total Time    Timed Minutes 38 minutes   Total Time 38 minutes        Assessment   Pt has difficulty engaging in proper foot clearance when performing gait training. Patient was instructed how to plan his step patterns to avoid fall risks. Patient was also given functional weightbearing exercises to increase B LE strengthening exercises. Patient was educated on avoiding prolonged sitting at home.   Plan   Focus on gait training and functional weightbearing exercises.       Goals    Goal 1: Increase knee extension strength to a 5/5 so patient can safely transfer sit to stand.     Tested at a 4-/5. 06/19/2020 [facility]   Sessions: 12      Goal 2: Patient will demonstrate independence in prescribed HEP with proper form, sets and reps for safe discharge to an independent program.     Access Code: [email or web address]  URL: [email or web address]  Date: 06/12/2020  Prepared by: Alyse Low    Exercises  Seated Hamstring Stretch with Strap - 1 x daily - 7 x weekly - 1 sets - 2 reps - 60secs hold  Goblet Squat with Kettlebell - 1 x daily - 7 x weekly - 3 sets - 10 reps  Step Up - 1 x daily - 7 x weekly - 3 sets - 10 reps  Standing Single Leg Stance with Counter Support - 1 x daily - 7 x weekly - 1 sets - 2 reps - 60secs hold     Sessions: 16      Goal 3: Increase single leg stance to 15 seconds so patient can negotiate curbs and uneven surfaces safely.   Sessions: 10      Goal 4: Increase hip flexion strength to a 5/5 so patient can negotiate 1 flight(s) of stairs reciprocally.   Sessions: [address]  Lauraine Rinne, DPT

## 2020-06-14 ENCOUNTER — Inpatient Hospital Stay: Payer: Medicare Other | Admitting: Rehabilitative and Restorative Service Providers"

## 2020-06-14 DIAGNOSIS — M5451 Vertebrogenic low back pain: Secondary | ICD-10-CM

## 2020-06-14 DIAGNOSIS — R262 Difficulty in walking, not elsewhere classified: Secondary | ICD-10-CM

## 2020-06-14 DIAGNOSIS — R2689 Other abnormalities of gait and mobility: Secondary | ICD-10-CM

## 2020-06-14 NOTE — PT/OT Therapy Note (Signed)
Name: Rick [name] Age: 87 y.o.   Date of Service: 06/14/2020  Referring Physician: Erasmo Score, MD   Date of Injury: 05/15/2020  Date Care Plan Established/Reviewed: 06/05/2020  Date Treatment Started: 06/05/2020  End of Certification Date: 09/02/2020  Sessions in Plan of Care: 16  Surgery Date: No data was found  MD Follow-up: No data was found  Medbridge Code: No data was found    Visit Count: 4   Diagnosis:   1. Balance disorder    2. Vertebrogenic low back pain    3. Difficulty in walking, not elsewhere classified        Subjective     History of Present Illness   History of Present Illness: Patient is having difficulty negotiating one flight of stairs reciprocally. (Patient was able to negotiate one flight of stairs reciprocally).   Patient is having difficulty negotiating curbs and uneven surfaces. (Patient was able to negotiate curbs and uneven surfaces).   Patient is having difficulty going from sitting to standing from a low surface. (Patient was able to go from sitting to standing from a low surface).     Outcome Measure   Tool Used/Details: Foto not obtained at time of documentation     Daily Subjective   Pt reports he is feeling a little bit better with walking     Social Support/Occupation  Lives in: multiple level home  Lives with: adult children  Occupation: retired       Precautions: No data was found  Allergies: Patient has no known allergies.                  Active Range of Motion (degrees)     Lumbar   Flexion: 60 degrees   Extension: 5 degrees with pain  Left lateral flexion: 23 degrees with pain  Right lateral flexion: 22 degrees with pain  Left rotation: 25 degrees   Right rotation: 25 degrees     Passive Range of Motion (degrees)      Left Hip   Flexion: 95 degrees   External rotation (90/90): 24 degrees with pain  Internal rotation (90/90): 0 degrees with pain    Right Hip   Flexion: 100 degrees   External rotation (90/90): 35 degrees   Internal rotation (90/90): 5 degrees      Strength/Myotome Testing (/5)     Left Hip   Planes of Motion   Flexion (L1/2): 3-  External rotation: 3-    Right Hip   Planes of Motion   Flexion (L1/2): 3  External rotation: 3-    Left Knee   Flexion (S1): 5  Extension (L3): 3+    Right Knee   Flexion (S1): 5  Extension (L3): 3+  Treatment     Therapeutic Exercises - Justified to address any of the following: develop strength, endurance, ROM and/or flexibility.   Nu step x 5 min during subjective intake and tx planning     Add: SB stretch for gastroc 2x1 min     Hamstring stretch in sitting with strap one min on each side with verbal cuing to avoid distal signs and symptoms.     Sit to stands with 5# kettlebell 2 x 10 with verbal cuing to maintain neutral lumbar pine    Discussion regarding turning to sit and standing in front of the chair fully for safety       Neuromuscular Re-Education - Justified to address any of the following: of movement, balance, coordination, kinesthetic sense,  posture and/or proprioception for sitting and/or standing activities.   SLS holding on to table 1 min x 2 on each side with verbal cuing to engage B hip/core muscles     Step Ups x 10 reps each in // bars with verbal cuing to avoid increased use of B UE and to engage gluteal muscles     Standing x 3 mins in narrow stance  with verbal cuing to avoid lateral sway and to stay in trunk neutral while looking ahead. Progressed to pertubation's in standing with Narrow BOS for balance x 4 min    Gait Training - Justified to address any of the following:  Ambulation including weight bearing variation, gait sequencing, instruction/progression of an assistive device, and/or stair climbing.    Wit SPC: Gait training from waiting room to treatment room. Gait training from table to // bars and from chair to chair with VC for less wide BOS, trunk and knee extension and looking up     Home Exercises   Access Code: [email or web address]  URL: [email or web address]  Date:  06/12/2020  Prepared by: Alyse Low    Exercises  Seated Hamstring Stretch with Strap - 1 x daily - 7 x weekly - 1 sets - 2 reps - 60secs hold  Goblet Squat with Kettlebell - 1 x daily - 7 x weekly - 3 sets - 10 reps  Step Up - 1 x daily - 7 x weekly - 3 sets - 10 reps  Standing Single Leg Stance with Counter Support - 1 x daily - 7 x weekly - 1 sets - 2 reps - 60secs hold         ---    Flowsheet Row ---   Total Time    Timed Minutes 38 minutes   Total Time 38 minutes        Assessment   Pt remains challenged with proper standing posture and gait pattern. He relies heavily on wide BOS, and flex through knees and hips. He is able to correct posture more easily in standing than walking with VC but can not maintain. Standing balance in narrow BOS or less than wide BOS is challenging but no LOB noted. Unable to perform SLS w/o 1 HHA.  Plan   Focus on gait training and functional weightbearing exercises.       Goals    Goal 1: Increase knee extension strength to a 5/5 so patient can safely transfer sit to stand.   Sessions: 12      Goal 2: Patient will demonstrate independence in prescribed HEP with proper form, sets and reps for safe discharge to an independent program.     Access Code: [email or web address]  URL: [email or web address]  Date: 06/12/2020  Prepared by: Alyse Low    Exercises  Seated Hamstring Stretch with Strap - 1 x daily - 7 x weekly - 1 sets - 2 reps - 60secs hold  Goblet Squat with Kettlebell - 1 x daily - 7 x weekly - 3 sets - 10 reps  Step Up - 1 x daily - 7 x weekly - 3 sets - 10 reps  Standing Single Leg Stance with Counter Support - 1 x daily - 7 x weekly - 1 sets - 2 reps - 60secs hold     Sessions: 16      Goal 3: Increase single leg stance to 15 seconds so patient can negotiate curbs and uneven surfaces safely.   Sessions: 10      Goal 4: Increase hip  flexion strength to a 5/5 so patient can negotiate 1 flight(s) of stairs reciprocally.   Sessions: 12                                 Victorio Palm, DPT

## 2020-06-17 ENCOUNTER — Inpatient Hospital Stay
Payer: Medicare Other | Attending: Student in an Organized Health Care Education/Training Program | Admitting: Rehabilitative and Restorative Service Providers"

## 2020-06-17 DIAGNOSIS — R2689 Other abnormalities of gait and mobility: Secondary | ICD-10-CM | POA: Insufficient documentation

## 2020-06-17 DIAGNOSIS — R262 Difficulty in walking, not elsewhere classified: Secondary | ICD-10-CM | POA: Insufficient documentation

## 2020-06-17 DIAGNOSIS — M5451 Vertebrogenic low back pain: Secondary | ICD-10-CM | POA: Insufficient documentation

## 2020-06-17 NOTE — PT/OT Therapy Note (Signed)
Name: Rashawd Laskaris Age: 87 y.o.   Date of Service: 06/17/2020  Referring Physician: Erasmo Score, MD   Date of Injury: 05/15/2020  Date Care Plan Established/Reviewed: 06/05/2020  Date Treatment Started: 06/05/2020  End of Certification Date: 09/02/2020  Sessions in Plan of Care: 16  Surgery Date: No data was found  MD Follow-up: No data was found  Medbridge Code: No data was found    Visit Count: 5   Diagnosis:   1. Balance disorder    2. Vertebrogenic low back pain    3. Difficulty in walking, not elsewhere classified        Subjective     History of Present Illness   History of Present Illness: Patient is having difficulty negotiating one flight of stairs reciprocally. (Patient was able to negotiate one flight of stairs reciprocally).   Patient is having difficulty negotiating curbs and uneven surfaces. (Patient was able to negotiate curbs and uneven surfaces).   Patient is having difficulty going from sitting to standing from a low surface. (Patient was able to go from sitting to standing from a low surface).     Outcome Measure   Tool Used/Details: Foto not obtained at time of documentation     Daily Subjective   Pt denies any fatigue following the last visit. He reports no balance deficits while walking over the weekend.    Social Support/Occupation  Lives in: multiple level home  Lives with: adult children  Occupation: retired       Precautions: No data was found  Allergies: Patient has no known allergies.                  Active Range of Motion (degrees)     Lumbar   Flexion: 60 degrees   Extension: 5 degrees with pain  Left lateral flexion: 23 degrees with pain  Right lateral flexion: 22 degrees with pain  Left rotation: 25 degrees   Right rotation: 25 degrees     Passive Range of Motion (degrees)      Left Hip   Flexion: 95 degrees   External rotation (90/90): 24 degrees with pain  Internal rotation (90/90): 0 degrees with pain    Right Hip   Flexion: 100 degrees   External rotation (90/90): 35  degrees   Internal rotation (90/90): 5 degrees     Strength/Myotome Testing (/5)     Left Hip   Planes of Motion   Flexion (L1/2): 3-  External rotation: 3-    Right Hip   Planes of Motion   Flexion (L1/2): 3  External rotation: 3-    Left Knee   Flexion (S1): 5  Extension (L3): 3+    Right Knee   Flexion (S1): 5  Extension (L3): 3+  Treatment     Therapeutic Exercises - Justified to address any of the following: develop strength, endurance, ROM and/or flexibility.   Nu step x 5 min during subjective intake and tx planning   - SB stretch for gastroc 2x1 min   - Standing HS stretch at stairwell, CGA, min A with foot positioning 2x30"   - Sit to stands with 5# kettlebell 2x10 with verbal cuing to maintain neutral lumbar pine  - STS pivot transfers x 6 reps with VC to step forward and feel chair prior to sitting for safety purposees         Neuromuscular Re-Education - Justified to address any of the following: of movement, balance, coordination, kinesthetic sense, posture and/or  proprioception for sitting and/or standing activities.   SLS holding on to table 1 min x 2 on each side with verbal cuing to engage B hip/core muscles         Standing x 3 mins in narrow stance  with verbal cuing to avoid lateral sway and to stay in trunk neutral while looking ahead. Progressed to pertubation's in standing with - Narrow BOS for balance x 2 min  - Progressed to perturbations10 in Narrow BOS x 4 min   - Marching in place in // bars for balance during weight shift 2x10 reps   - Toe taps on 6" step in // bars x8 reps each leg for balance during weight shift     Gait Training - Justified to address any of the following:  Ambulation including weight bearing variation, gait sequencing, instruction/progression of an assistive device, and/or stair climbing.    Ambulating around clinic with SPC on R x 5 min with VC for narrow base of support and larger stride length    Step Ups x 10 reps each in // bars with verbal cuing to avoid  increased use of BUE and avoid dragging foot on step     Home Exercises   Access Code: [email or web address]  URL: [email or web address]  Date: 06/12/2020  Prepared by: Alyse Low    Exercises  Seated Hamstring Stretch with Strap - 1 x daily - 7 x weekly - 1 sets - 2 reps - 60secs hold  Goblet Squat with Kettlebell - 1 x daily - 7 x weekly - 3 sets - 10 reps  Step Up - 1 x daily - 7 x weekly - 3 sets - 10 reps  Standing Single Leg Stance with Counter Support - 1 x daily - 7 x weekly - 1 sets - 2 reps - 60secs hold         ---    Flowsheet Row ---   Total Time    Timed Minutes 41 minutes   Total Time 41 minutes        Assessment   PT focused on reducing base of support with ambulation, standing, and STS transfers. Pt demonstrates dec safety with sitting as she tends to feel chair minimally and slide into chair. PT added pivot transfers to focus on getting body fully in front of chair prior to standing. Good carry over of walking with feet in more narrow position today. Pt cont to fatigue quickly from balance exericses.  Plan   Focus on gait training and functional weightbearing exercises.       Goals    Goal 1: Increase knee extension strength to a 5/5 so patient can safely transfer sit to stand.   Sessions: 12      Goal 2: Patient will demonstrate independence in prescribed HEP with proper form, sets and reps for safe discharge to an independent program.     Access Code: [email or web address]  URL: [email or web address]  Date: 06/12/2020  Prepared by: Alyse Low    Exercises  Seated Hamstring Stretch with Strap - 1 x daily - 7 x weekly - 1 sets - 2 reps - 60secs hold  Goblet Squat with Kettlebell - 1 x daily - 7 x weekly - 3 sets - 10 reps  Step Up - 1 x daily - 7 x weekly - 3 sets - 10 reps  Standing Single Leg Stance with Counter Support - 1 x daily - 7 x weekly - 1 sets - 2 reps - 60secs hold  Sessions: 16      Goal 3: Increase single leg stance to 15 seconds so patient can negotiate curbs and uneven  surfaces safely.   Sessions: 10      Goal 4: Increase hip flexion strength to a 5/5 so patient can negotiate 1 flight(s) of stairs reciprocally.   Sessions: 12                                Victorio Palm, DPT

## 2020-06-19 ENCOUNTER — Inpatient Hospital Stay: Payer: Medicare Other | Admitting: Rehabilitative and Restorative Service Providers"

## 2020-06-19 DIAGNOSIS — R262 Difficulty in walking, not elsewhere classified: Secondary | ICD-10-CM

## 2020-06-19 DIAGNOSIS — M5451 Vertebrogenic low back pain: Secondary | ICD-10-CM

## 2020-06-19 DIAGNOSIS — R2689 Other abnormalities of gait and mobility: Secondary | ICD-10-CM

## 2020-06-19 NOTE — PT/OT Therapy Note (Signed)
Name: Rick [name] Age: 87 y.o.   Date of Service: 06/19/2020  Referring Physician: Erasmo Score, MD   Date of Injury: 05/15/2020  Date Care Plan Established/Reviewed: 06/05/2020  Date Treatment Started: 06/05/2020  End of Certification Date: 09/02/2020  Sessions in Plan of Care: 16  Surgery Date: No data was found  MD Follow-up: No data was found  Medbridge Code: No data was found    Visit Count: 6   Diagnosis:   1. Balance disorder    2. Vertebrogenic low back pain    3. Difficulty in walking, not elsewhere classified        Subjective     History of Present Illness   History of Present Illness: Patient is having difficulty negotiating one flight of stairs reciprocally. (Patient was able to negotiate one flight of stairs reciprocally).   Patient is having difficulty negotiating curbs and uneven surfaces. (Patient was able to negotiate curbs and uneven surfaces).   Patient is having difficulty going from sitting to standing from a low surface. (Patient was able to go from sitting to standing from a low surface).     Outcome Measure   Tool Used/Details: Foto not obtained at time of documentation     Daily Subjective   Patient reports that his balance is fair. He notes that his walking has been better since starting his therapy.     Social Support/Occupation  Lives in: multiple level home  Lives with: adult children  Occupation: retired       Precautions: No data was found  Allergies: Patient has no known allergies.                  Active Range of Motion (degrees)     Lumbar   Flexion: 60 degrees   Extension: 5 degrees with pain  Left lateral flexion: 23 degrees with pain  Right lateral flexion: 22 degrees with pain  Left rotation: 25 degrees   Right rotation: 25 degrees     Passive Range of Motion (degrees)      Left Hip   Flexion: 95 degrees   External rotation (90/90): 24 degrees with pain  Internal rotation (90/90): 0 degrees with pain    Right Hip   Flexion: 100 degrees   External rotation (90/90): 35  degrees   Internal rotation (90/90): 5 degrees     Strength/Myotome Testing (/5)     Left Hip   Planes of Motion   Flexion (L1/2): 3-  External rotation: 3-    Right Hip   Planes of Motion   Flexion (L1/2): 3  External rotation: 3-    Left Knee   Flexion (S1): 5  Extension (L3): 3+ 06/19/2020 4-/5    Right Knee   Flexion (S1): 5  Extension (L3): 3+ 06/19/2020 4-/5  Treatment     Therapeutic Exercises - Justified to address any of the following: develop strength, endurance, ROM and/or flexibility.   HEP reviewed to ensure patient compliance.     Strength assessed before treatment session.     Hamstring stretch in sitting with strap one min on each side with verbal cuing to avoid distal signs and symptoms.     Sit to stands with 7.5# kettlebell 3 x 10 with verbal cuing to maintain neutral lumbar pine      Neuromuscular Re-Education - Justified to address any of the following: of movement, balance, coordination, kinesthetic sense, posture and/or proprioception for sitting and/or standing activities.   SLS holding on to  table 1 min x 2 on each side with verbal cuing to engage B hip/core muscles     Step Ups 20 x with verbal cuing to avoid increased use of B UE and to engage gluteal muscles     Standing on foam for 3 mins with verbal cuing to avoid lateral sway      Gait Training - Justified to address any of the following:  Ambulation including weight bearing variation, gait sequencing, instruction/progression of an assistive device, and/or stair climbing.    Gait training from waiting room to treatment room. Obstacles were also set up for patient around the room and he was instructed to initiate foot clearance to avoid tripping. Practiced ambulating to the restroom.     Home Exercises   Access Code: [email or web address]  URL: [email or web address]  Date: 06/12/2020  Prepared by: Alyse Low    Exercises  Seated Hamstring Stretch with Strap - 1 x daily - 7 x weekly - 1 sets - 2 reps - 60secs hold  Goblet Squat  with Kettlebell - 1 x daily - 7 x weekly - 3 sets - 10 reps  Step Up - 1 x daily - 7 x weekly - 3 sets - 10 reps  Standing Single Leg Stance with Counter Support - 1 x daily - 7 x weekly - 1 sets - 2 reps - 60secs hold         ---    Flowsheet Row ---   Total Time    Timed Minutes 38 minutes   Total Time 38 minutes        Assessment   Pt had his exercises progressed by adding additional weights to the squats and progressing his balance exercises. Patient requires close supervision for his exercises. He is noted to have improvements in his gait pattern and balance since first starting PT.  Plan   Keep progressing weightbearing exercises.       Goals    Goal 1: Increase knee extension strength to a 5/5 so patient can safely transfer sit to stand.   Sessions: 12      Goal 2: Patient will demonstrate independence in prescribed HEP with proper form, sets and reps for safe discharge to an independent program.     Access Code: [email or web address]  URL: [email or web address]  Date: 06/12/2020  Prepared by: Alyse Low    Exercises  Seated Hamstring Stretch with Strap - 1 x daily - 7 x weekly - 1 sets - 2 reps - 60secs hold  Goblet Squat with Kettlebell - 1 x daily - 7 x weekly - 3 sets - 10 reps  Step Up - 1 x daily - 7 x weekly - 3 sets - 10 reps  Standing Single Leg Stance with Counter Support - 1 x daily - 7 x weekly - 1 sets - 2 reps - 60secs hold     Sessions: 16      Goal 3: Increase single leg stance to 15 seconds so patient can negotiate curbs and uneven surfaces safely.   Sessions: 10      Goal 4: Increase hip flexion strength to a 5/5 so patient can negotiate 1 flight(s) of stairs reciprocally.   Sessions: 12                                Francis Dowse, DPT

## 2020-06-21 ENCOUNTER — Inpatient Hospital Stay: Payer: Medicare Other | Admitting: Rehabilitative and Restorative Service Providers"

## 2020-06-21 DIAGNOSIS — R262 Difficulty in walking, not elsewhere classified: Secondary | ICD-10-CM

## 2020-06-21 DIAGNOSIS — M5451 Vertebrogenic low back pain: Secondary | ICD-10-CM

## 2020-06-21 DIAGNOSIS — R2689 Other abnormalities of gait and mobility: Secondary | ICD-10-CM

## 2020-06-21 NOTE — PT/OT Therapy Note (Signed)
Name: Rick [name] Age: 87 y.o.   Date of Service: 06/21/2020  Referring Physician: Erasmo Score, MD   Date of Injury: 05/15/2020  Date Care Plan Established/Reviewed: 06/05/2020  Date Treatment Started: 06/05/2020  End of Certification Date: 09/02/2020  Sessions in Plan of Care: 16  Surgery Date: No data was found  MD Follow-up: No data was found  Medbridge Code: No data was found    Visit Count: 7   Diagnosis:   1. Balance disorder    2. Vertebrogenic low back pain    3. Difficulty in walking, not elsewhere classified        Subjective     History of Present Illness   History of Present Illness: Patient is having difficulty negotiating one flight of stairs reciprocally. (Patient was able to negotiate one flight of stairs reciprocally).   Patient is having difficulty negotiating curbs and uneven surfaces. (Patient was able to negotiate curbs and uneven surfaces).   Patient is having difficulty going from sitting to standing from a low surface. (Patient was able to go from sitting to standing from a low surface).     Outcome Measure   Tool Used/Details: Foto not obtained at time of documentation     Daily Subjective   Pt reports feeling ok. Son states that he is more unsteady today for unknown reasons, but yesterday was a better day.     Social Support/Occupation  Lives in: multiple level home  Lives with: adult children  Occupation: retired       Precautions: No data was found  Allergies: Patient has no known allergies.                  Active Range of Motion (degrees)     Lumbar   Flexion: 60 degrees   Extension: 5 degrees with pain  Left lateral flexion: 23 degrees with pain  Right lateral flexion: 22 degrees with pain  Left rotation: 25 degrees   Right rotation: 25 degrees     Passive Range of Motion (degrees)      Left Hip   Flexion: 95 degrees   External rotation (90/90): 24 degrees with pain  Internal rotation (90/90): 0 degrees with pain    Right Hip   Flexion: 100 degrees   External rotation  (90/90): 35 degrees   Internal rotation (90/90): 5 degrees     Strength/Myotome Testing (/5)     Left Hip   Planes of Motion   Flexion (L1/2): 3-  External rotation: 3-    Right Hip   Planes of Motion   Flexion (L1/2): 3  External rotation: 3-    Left Knee   Flexion (S1): 5  Extension (L3): 3+ 06/19/2020 4-/5    Right Knee   Flexion (S1): 5  Extension (L3): 3+ 06/19/2020 4-/5  Treatment     Therapeutic Exercises - Justified to address any of the following: develop strength, endurance, ROM and/or flexibility.   Nu step x 5 min for endurance     Hamstring stretch in sitting with foot on stool x 1 min each, PT manual assist to ensure knee extension    Standing hip 3 way AROM in // bars  x 10 reps each, SBA, heavy VC for form and instruction on exercise       Heel / toe raise x 10 reps each. Heavy VC for form. PT resistance to trunk ext    Gait Training - Justified to address any of the following:  Ambulation  including weight bearing variation, gait sequencing, instruction/progression of an assistive device, and/or stair climbing.    Gait training from waiting room < > clinic and around obstacles. VC for upright posture, longer stride length, increased foot clearance     Therapeutic Activity - Justified to address the following: activities to improve functional performance.  Squat pivot in chair x 10 min focusing on stepping forward, turning fulling, and lining self up with chair for safety measures     Home Exercises   Access Code: [email or web address]  URL: [email or web address]  Date: 06/12/2020  Prepared by: Alyse Low    Exercises  Seated Hamstring Stretch with Strap - 1 x daily - 7 x weekly - 1 sets - 2 reps - 60secs hold  Goblet Squat with Kettlebell - 1 x daily - 7 x weekly - 3 sets - 10 reps  Step Up - 1 x daily - 7 x weekly - 3 sets - 10 reps  Standing Single Leg Stance with Counter Support - 1 x daily - 7 x weekly - 1 sets - 2 reps - 60secs hold         ---    Flowsheet Row ---   Total Time    Timed  Minutes 38 minutes   Total Time 38 minutes        Assessment   Pt had his exercises progressed by adding additional weights to the squats and progressing his balance exercises. Patient requires close supervision for his exercises. He is noted to have improvements in his gait pattern and balance since first starting PT.  Plan   Keep progressing weightbearing exercises.       Goals    Goal 1: Increase knee extension strength to a 5/5 so patient can safely transfer sit to stand.     Tested at a 4-/5. 06/19/2020 [facility]   Sessions: 12      Goal 2: Patient will demonstrate independence in prescribed HEP with proper form, sets and reps for safe discharge to an independent program.     Access Code: [email or web address]  URL: [email or web address]  Date: 06/12/2020  Prepared by: Alyse Low    Exercises  Seated Hamstring Stretch with Strap - 1 x daily - 7 x weekly - 1 sets - 2 reps - 60secs hold  Goblet Squat with Kettlebell - 1 x daily - 7 x weekly - 3 sets - 10 reps  Step Up - 1 x daily - 7 x weekly - 3 sets - 10 reps  Standing Single Leg Stance with Counter Support - 1 x daily - 7 x weekly - 1 sets - 2 reps - 60secs hold     Sessions: 16      Goal 3: Increase single leg stance to 15 seconds so patient can negotiate curbs and uneven surfaces safely.   Sessions: 10      Goal 4: Increase hip flexion strength to a 5/5 so patient can negotiate 1 flight(s) of stairs reciprocally.   Sessions: 12                                Victorio Palm, DPT

## 2020-06-25 ENCOUNTER — Inpatient Hospital Stay: Payer: Medicare Other | Admitting: Professional

## 2020-06-25 DIAGNOSIS — R2689 Other abnormalities of gait and mobility: Secondary | ICD-10-CM

## 2020-06-25 DIAGNOSIS — M5451 Vertebrogenic low back pain: Secondary | ICD-10-CM

## 2020-06-25 DIAGNOSIS — R262 Difficulty in walking, not elsewhere classified: Secondary | ICD-10-CM

## 2020-06-25 NOTE — PT/OT Therapy Note (Signed)
Name: Rick [name] Age: 87 y.o.   Date of Service: 06/25/2020  Referring Physician: Erasmo Score, MD   Date of Injury: 05/15/2020  Date Care Plan Established/Reviewed: 06/05/2020  Date Treatment Started: 06/05/2020  End of Certification Date: 09/02/2020  Sessions in Plan of Care: 16  Surgery Date: No data was found  MD Follow-up: No data was found  Medbridge Code: No data was found    Visit Count: 8   Diagnosis:   1. Balance disorder    2. Vertebrogenic low back pain    3. Difficulty in walking, not elsewhere classified        Subjective     History of Present Illness   History of Present Illness: Patient is having difficulty negotiating one flight of stairs reciprocally. (Patient was able to negotiate one flight of stairs reciprocally).   Patient is having difficulty negotiating curbs and uneven surfaces. (Patient was able to negotiate curbs and uneven surfaces).   Patient is having difficulty going from sitting to standing from a low surface. (Patient was able to go from sitting to standing from a low surface).     Outcome Measure   Tool Used/Details: Foto not obtained at time of documentation     Daily Subjective   Rick [name] reports and daughter-in-law, Rick [name], confirms pt has difficulty standing after sitting for a while (1 hour or more).     Social Support/Occupation  Lives in: multiple level home  Lives with: adult children  Occupation: retired       Precautions: No data was found  Allergies: Patient has no known allergies.                  Active Range of Motion (degrees)     Lumbar   Flexion: 60 degrees   Extension: 5 degrees with pain  Left lateral flexion: 23 degrees with pain  Right lateral flexion: 22 degrees with pain  Left rotation: 25 degrees   Right rotation: 25 degrees     Passive Range of Motion (degrees)      Left Hip   Flexion: 95 degrees   External rotation (90/90): 24 degrees with pain  Internal rotation (90/90): 0 degrees with pain    Right Hip   Flexion: 100 degrees   External rotation  (90/90): 35 degrees   Internal rotation (90/90): 5 degrees     Strength/Myotome Testing (/5)     Left Hip   Planes of Motion   Flexion (L1/2): 3-  External rotation: 3-    Right Hip   Planes of Motion   Flexion (L1/2): 3  External rotation: 3-    Left Knee   Flexion (S1): 5  Extension (L3): 3+ 06/19/2020 4-/5    Right Knee   Flexion (S1): 5  Extension (L3): 3+ 06/19/2020 4-/5  Treatment     Therapeutic Exercises - Justified to address any of the following: develop strength, endurance, ROM and/or flexibility.   Active hamstring stretch in sitting with foot on stool x 30 reps each, PT manual assist to ensure knee extension    Standing hip ABD and ext AROM at // bars  x 10 reps each, SBA,  VC for upright posture    Standing marches  2 x 30 reps SBA/CGA    Heel / toe raise 2  x 10 reps each.  VC for posture and UE support at side not forward.     Not Today:  Nu step x 5 min for endurance  Gait Training - Justified to address any of the following:  Ambulation including weight bearing variation, gait sequencing, instruction/progression of an assistive device, and/or stair climbing.    Gait training around clinic c turns, pivots, step overs and weaving around obstacles and speed variable cues. VC for upright posture, longer stride length, increased foot clearance     Therapeutic Activity - Justified to address the following: activities to improve functional performance.  STS from lower black chair x 5 reps UE using SPC, progressed to LUE holding 5# KB x 10 reps then BUE hold 5# KB 2 x 10 reps    Home Exercises   Access Code: [email or web address]  URL: [email or web address]  Date: 06/12/2020  Prepared by: Alyse Low    Exercises  Seated Hamstring Stretch with Strap - 1 x daily - 7 x weekly - 1 sets - 2 reps - 60secs hold  Goblet Squat with Kettlebell - 1 x daily - 7 x weekly - 3 sets - 10 reps  Step Up - 1 x daily - 7 x weekly - 3 sets - 10 reps  Standing Single Leg Stance with Counter Support - 1 x daily - 7  x weekly - 1 sets - 2 reps - 60secs hold         ---    Flowsheet Row ---   Total Time    Timed Minutes 40 minutes   Total Time 40 minutes        Assessment   Lazarus has WBOS for STS transfer and gait, min - mod correction with verbal cues. Pt does well with cues to increase stride with one foot in front of the other. Pt ambulates 25' in 22 seconds, instructed to walk as safely and quickly to beat time, perform in 21 seconds. Advised pt to avoid prolonged seating and increase daily activity.   Plan   Initiate step ups      Goals    Goal 1: Increase knee extension strength to a 5/5 so patient can safely transfer sit to stand.     Tested at a 4-/5. 06/19/2020 [facility]   Sessions: 12      Goal 2: Patient will demonstrate independence in prescribed HEP with proper form, sets and reps for safe discharge to an independent program.     Access Code: [email or web address]  URL: [email or web address]  Date: 06/12/2020  Prepared by: Alyse Low    Exercises  Seated Hamstring Stretch with Strap - 1 x daily - 7 x weekly - 1 sets - 2 reps - 60secs hold  Goblet Squat with Kettlebell - 1 x daily - 7 x weekly - 3 sets - 10 reps  Step Up - 1 x daily - 7 x weekly - 3 sets - 10 reps  Standing Single Leg Stance with Counter Support - 1 x daily - 7 x weekly - 1 sets - 2 reps - 60secs hold     Sessions: 16      Goal 3: Increase single leg stance to 15 seconds so patient can negotiate curbs and uneven surfaces safely.   Sessions: 10      Goal 4: Increase hip flexion strength to a 5/5 so patient can negotiate 1 flight(s) of stairs reciprocally.   Sessions: [address]                                Eliberto Ivory, [place]

## 2020-06-28 ENCOUNTER — Inpatient Hospital Stay: Payer: Medicare Other | Admitting: Rehabilitative and Restorative Service Providers"

## 2020-06-28 DIAGNOSIS — M5451 Vertebrogenic low back pain: Secondary | ICD-10-CM

## 2020-06-28 DIAGNOSIS — R262 Difficulty in walking, not elsewhere classified: Secondary | ICD-10-CM

## 2020-06-28 DIAGNOSIS — R2689 Other abnormalities of gait and mobility: Secondary | ICD-10-CM

## 2020-06-28 NOTE — Progress Notes (Signed)
Name:Rick [name] Age: 87 y.o.   Date of Service: 06/28/2020  Referring Physician: Erasmo Score, MD   Date of Injury: 05/15/2020  Date Care Plan Established/Reviewed: 06/28/2020  Date Treatment Started: 06/05/2020  End of Certification Date: 09/25/2020  Sessions in Plan of Care: 16  Surgery Date: No data was found  MD Follow-up: No data was found  Medbridge Code: No data was found    Visit Count: 9   Diagnosis:   1. Balance disorder    2. Vertebrogenic low back pain    3. Difficulty in walking, not elsewhere classified        Subjective     Daily Subjective   Patient states that his walking and balance is coming along. He notes that he can move a little faster since starting PT.     Social Support/Occupation  Lives in: multiple level home  Lives with: adult children  Occupation: retired       Precautions: No data was found  Allergies: Patient has no known allergies.    Past Medical History:   Diagnosis Date   . Atrial enlargement, bilateral Echo result 04/04/19   . CAD (coronary artery disease) 05/28/19 Cath results.    Severe 3V CAD.  Patent LIMA to LAD, patent SVG to OM 1 and OM 2 of circumflex CA, & patent SVG to RCA.   Marland Kitchen Chronic combined systolic and diastolic heart failure 310/21 OV Dr. Penny Pia    EF=30%   . Dementia Mar 2016   . Difficulty walking May 2018    Post surgery of lt knee, rt femur   . Dyslipidemia 310/21 OV Dr. Penny Pia   . Dyspnea on exertion Jan 2020   . HTN (hypertension) 310/21 OV Dr. Penny Pia   . Mild cognitive impairment 310/21 OV Dr. Penny Pia   . Primary cardiomyopathy Oct 2019   . RBBB 07/10/19 EKg result   . S/P CABG (coronary artery bypass graft)      LIMA to LAD,  SVG to OM 1 & OM 2 of circumflex CA, & SVG to RCA.                   Active Range of Motion (degrees)     Lumbar   Flexion:60degrees   Extension:5degrees with pain  Left lateral flexion:23degrees with pain  Right lateral flexion:22degrees with pain  Left rotation:25degrees   Right rotation:25degrees     Passive  Range of Motion (degrees)     Left Hip   Flexion: 95degrees   External rotation (90/90): 24degrees with pain  Internal rotation (90/90): 0degrees with pain    Right Hip   Flexion: 100degrees   External rotation (90/90): 35degrees   Internal rotation (90/90): 5degrees     Strength/Myotome Testing (/5)     Left Hip   Planes of Motion   Flexion (L1/2): 3- 06/28/2020 4-/5  External rotation: 3-    Right Hip   Planes of Motion   Flexion (L1/2): 3 06/28/2020 4/5  External rotation: 3-    Left Knee   Flexion (S1): 5  Extension (L3): 3+ 06/19/2020 4-/5 06/28/2020 4-/5    Right Knee   Flexion (S1): 5  Extension (L3): 3+ 06/19/2020 4-/5 06/28/2020  4-/5  Treatment     Therapeutic Exercises - Justified to address any of the following: develop strength, endurance, ROM and/or flexibility.   HEP reviewed to ensure patient compliance.     Measurements taken before treatment session.     Hamstring stretch in sitting  with strap one min on each side with verbal cuing to avoid distal signs and symptoms.     Sit to stands with 10# kettlebell 2 x 10 with verbal cuing to maintain neutral lumbar pine    Neuromuscular Re-Education - Justified to address any of the following: of movement, balance, coordination, kinesthetic sense, posture and/or proprioception for sitting and/or standing activities.   SLS holding on to table 1 min on each side with verbal cuing to engage B hip/core muscles     Step Ups holding on to 7.5# with opposite hand 20 x with verbal cuing to avoid increased use of B UE and to engage gluteal muscles     Standing on foam for 5 mins with verbal cuing to avoid lateral sway      Gait Training - Justified to address any of the following:  Ambulation including weight bearing variation, gait sequencing, instruction/progression of an assistive device, and/or stair climbing.    Gait training from waiting room to treatment room. Obstacles were also set up for patient around the room and he was instructed to  initiate foot clearance to avoid tripping. Practiced ambulating to the restroom.        ---    Flowsheet Row ---   Total Time    Timed Minutes 38 minutes   Total Time 38 minutes        Assessment   Patient has been seen for 9 visits in this case from 06/05/2020 until 06/28/2020. The primary encounter diagnosis was Balance disorder. Diagnoses of Vertebrogenic low back pain and Difficulty in walking, not elsewhere classified were also pertinent to this visit.  Patient has made good progress in posture, range of motion and strength since the initial evaluation. Patient would benefit from continued skilled PT intervention to address these deficits and achieve the below listed goals.    Barriers to therapy:     Prognosis: excellent  Patient is aware of diagnosis, prognosis and consents to plan of care: Yes  Plan   Visits per week: 1  Number of Sessions: 16  Direct One on One  16109: Therapeutic Exercise: To Develop Strength and Endurance, ROM and Flexibility  [id number]: Gait Training  60454: Manual Therapy techniques (mobilization, manipulation, manual traction)  97530: Therapeutic Activities: Dynamic activities to improve functional performance  Continue progressing functional weightbearing exercises.      Goals    Goal 1: Increase knee extension strength to a 5/5 so patient can safely transfer sit to stand.     Tested at a 4-/5. 06/19/2020 MJM     Look at chart. 06/28/2020 MJM   Sessions: 12      Goal 2: Patient will demonstrate independence in prescribed HEP with proper form, sets and reps for safe discharge to an independent program.     Access Code: [email or web address]  URL: [email or web address]  Date: 06/12/2020  Prepared by: Alyse Low    Exercises  Seated Hamstring Stretch with Strap - 1 x daily - 7 x weekly - 1 sets - 2 reps - 60secs hold  Goblet Squat with Kettlebell - 1 x daily - 7 x weekly - 3 sets - 10 reps  Step Up - 1 x daily - 7 x weekly - 3 sets - 10 reps  Standing Single Leg Stance with Counter Support -  1 x daily - 7 x weekly - 1 sets - 2 reps - 60secs hold     Sessions: 16      Goal 3: Increase single leg  stance to 15 seconds so patient can negotiate curbs and uneven surfaces safely.     Look at chart. 06/28/2020 MJM   Sessions: 10      Goal 4: Increase hip flexion strength to a 5/5 so patient can negotiate 1 flight(s) of stairs reciprocally.    Look at chart. 06/28/2020 MJM   Sessions: 12                                Francis Dowse, DPT

## 2020-07-03 ENCOUNTER — Inpatient Hospital Stay: Payer: Medicare Other | Admitting: Rehabilitative and Restorative Service Providers"

## 2020-07-03 DIAGNOSIS — R262 Difficulty in walking, not elsewhere classified: Secondary | ICD-10-CM

## 2020-07-03 DIAGNOSIS — M5451 Vertebrogenic low back pain: Secondary | ICD-10-CM

## 2020-07-03 DIAGNOSIS — R2689 Other abnormalities of gait and mobility: Secondary | ICD-10-CM

## 2020-07-03 NOTE — PT/OT Therapy Note (Signed)
Name: Rick [name] Age: 87 y.o.   Date of Service: 07/03/2020  Referring Physician: Erasmo Score, MD   Date of Injury: 05/15/2020  Date Care Plan Established/Reviewed: 06/28/2020  Date Treatment Started: 06/05/2020  End of Certification Date: 09/25/2020  Sessions in Plan of Care: 16  Surgery Date: No data was found  MD Follow-up: No data was found  Medbridge Code: No data was found    Visit Count: 10   Diagnosis:   1. Balance disorder    2. Vertebrogenic low back pain    3. Difficulty in walking, not elsewhere classified        Subjective     Daily Subjective   Patient reports that his body felt fine after he feel outside in his last session. Patient states that his balance is about the same.     Social Support/Occupation  Lives in: multiple level home  Lives with: adult children  Occupation: retired       Precautions: No data was found  Allergies: Patient has no known allergies.              Active Range of Motion (degrees)     Lumbar   Flexion:60degrees   Extension:5degrees with pain  Left lateral flexion:23degrees with pain  Right lateral flexion:22degrees with pain  Left rotation:25degrees   Right rotation:25degrees     Passive Range of Motion (degrees)     Left Hip   Flexion: 95degrees   External rotation (90/90): 24degrees with pain  Internal rotation (90/90): 0degrees with pain    Right Hip   Flexion: 100degrees   External rotation (90/90): 35degrees   Internal rotation (90/90): 5degrees     Strength/Myotome Testing (/5)     Left Hip   Planes of Motion   Flexion (L1/2): 3- 06/28/2020 4-/5  External rotation: 3-    Right Hip   Planes of Motion   Flexion (L1/2): 3 06/28/2020 4/5  External rotation: 3-    Left Knee   Flexion (S1): 5  Extension (L3): 3+ 06/19/2020 4-/5 06/28/2020 4-/5    Right Knee   Flexion (S1): 5  Extension (L3): 3+ 06/19/2020 4-/5 06/28/2020  4-/5  Treatment     Therapeutic Exercises - Justified to address any of the following: develop strength, endurance,  ROM and/or flexibility.   HEP reviewed to ensure patient compliance.     Measurements taken before treatment session.     Hamstring stretch in sitting with strap one min on each side with verbal cuing to avoid distal signs and symptoms.     Sit to stands with 10# kettlebell 2 x 10 with verbal cuing to maintain neutral lumbar pine    Neuromuscular Re-Education - Justified to address any of the following: of movement, balance, coordination, kinesthetic sense, posture and/or proprioception for sitting and/or standing activities.   SLS holding on to table 1 min on each side with verbal cuing to engage B hip/core muscles     Step Ups with gait belt holding on to 7.5# with opposite hand 15 x with verbal cuing to avoid increased use of B UE and to engage gluteal muscles     Standing on foam for 3 mins with verbal cuing to avoid lateral sway      Therapeutic Activity - Justified to address the following: activities to improve functional performance.  Practiced negotiating curbs outside so patient can perform in his community.        ---    Flowsheet Row ---  Total Time    Timed Minutes 38 minutes   Total Time 38 minutes        Assessment   Patient was able to step up and down curb using cane with min A and verbal cuing to step closer to the curb before stepping up and down. Patient still has difficulty with step ups, requiring verbal cuing to limit weight bearing through UE and to stand tall. Patient is able to stand on B LE on foam without balance difficulties, but requires verbal cuing to stand tall.  Plan   Continue with functional weightbearing exercises.       Goals    Goal 1: Increase knee extension strength to a 5/5 so patient can safely transfer sit to stand.     Tested at a 4-/5. 06/19/2020 MJM     Look at chart. 06/28/2020 MJM   Sessions: 12      Goal 2: Patient will demonstrate independence in prescribed HEP with proper form, sets and reps for safe discharge to an independent program.     Access Code:  [email or web address]  URL: [email or web address]  Date: 06/12/2020  Prepared by: Alyse Low    Exercises  Seated Hamstring Stretch with Strap - 1 x daily - 7 x weekly - 1 sets - 2 reps - 60secs hold  Goblet Squat with Kettlebell - 1 x daily - 7 x weekly - 3 sets - 10 reps  Step Up - 1 x daily - 7 x weekly - 3 sets - 10 reps  Standing Single Leg Stance with Counter Support - 1 x daily - 7 x weekly - 1 sets - 2 reps - 60secs hold     Sessions: 16      Goal 3: Increase single leg stance to 15 seconds so patient can negotiate curbs and uneven surfaces safely.     Look at chart. 06/28/2020 MJM   Sessions: 10      Goal 4: Increase hip flexion strength to a 5/5 so patient can negotiate 1 flight(s) of stairs reciprocally.    Look at chart. 06/28/2020 MJM   Sessions: 12                                Francis Dowse, DPT

## 2020-07-26 ENCOUNTER — Inpatient Hospital Stay
Payer: Medicare Other | Attending: Student in an Organized Health Care Education/Training Program | Admitting: Professional

## 2020-07-26 DIAGNOSIS — R262 Difficulty in walking, not elsewhere classified: Secondary | ICD-10-CM | POA: Insufficient documentation

## 2020-07-26 DIAGNOSIS — R2689 Other abnormalities of gait and mobility: Secondary | ICD-10-CM | POA: Insufficient documentation

## 2020-07-26 DIAGNOSIS — M5451 Vertebrogenic low back pain: Secondary | ICD-10-CM | POA: Insufficient documentation

## 2020-07-26 NOTE — PT/OT Therapy Note (Signed)
Name: Rick [name] Age: 87 y.o.   Date of Service: 07/26/2020  Referring Physician: Erasmo Score, MD   Date of Injury: 05/15/2020  Date Care Plan Established/Reviewed: 06/28/2020  Date Treatment Started: 06/05/2020  End of Certification Date: 09/25/2020  Sessions in Plan of Care: 16  Surgery Date: No data was found  MD Follow-up: No data was found  Medbridge Code: No data was found    Visit Count: 11   Diagnosis:   1. Balance disorder    2. Vertebrogenic low back pain    3. Difficulty in walking, not elsewhere classified        Subjective     Daily Subjective   Patient reports he is not walking much outside of his home. Pt reports he is not using the stairs in his home often, most important to pt today is his walking.    Social Support/Occupation  Lives in: multiple level home  Lives with: adult children  Occupation: retired       Precautions: No data was found  Allergies: Patient has no known allergies.                  Active Range of Motion (degrees)     Lumbar   Flexion:60degrees   Extension:5degrees with pain  Left lateral flexion:23degrees with pain  Right lateral flexion:22degrees with pain  Left rotation:25degrees   Right rotation:25degrees     Passive Range of Motion (degrees)     Left Hip   Flexion: 95degrees   External rotation (90/90): 24degrees with pain  Internal rotation (90/90): 0degrees with pain    Right Hip   Flexion: 100degrees   External rotation (90/90): 35degrees   Internal rotation (90/90): 5degrees     Strength/Myotome Testing (/5)     Left Hip   Planes of Motion   Flexion (L1/2): 3- 06/28/2020 4-/5  External rotation: 3-    Right Hip   Planes of Motion   Flexion (L1/2): 3 06/28/2020 4/5  External rotation: 3-    Left Knee   Flexion (S1): 5  Extension (L3): 3+ 06/19/2020 4-/5 06/28/2020 4-/5    Right Knee   Flexion (S1): 5  Extension (L3): 3+ 06/19/2020 4-/5 06/28/2020  4-/5  Treatment     Therapeutic Exercises - Justified to address any of the following:  develop strength, endurance, ROM and/or flexibility.   nustep x 5 min L3  with equipment set up and subjective obtained for treatment planning    Sit to stands with 10# kettlebell 2 x 10 with verbal cuing for mechanics and posture    Gait Training - Justified to address any of the following:  Ambulation including weight bearing variation, gait sequencing, instruction/progression of an assistive device, and/or stair climbing.    Ambulation in clinic c AD and CGA 50' x 4 and 15 ' x 2 Max VC for narrowing BOS and forward stride    Outside ambulation on sidewalk c AD and CGA and HHA when fatigued 70' x 2 navigated ramp up/down VC for narrowing BOS and forward stride Max VC for narrowing BOS and forward stride    Ambulation in clinic c BL walking sticks and CGA 30' x 2 Min-Mod VC for narrowing BOS and forward stride       ---    Flowsheet Row ---   Total Time    Timed Minutes 40 minutes   Total Time 40 minutes        Assessment   Bosten demonstrates excessively wide  BOS, he ambulates using a short step length and wide step width. Pt requires max VC to narrow BOS and to perform forward stride. Pt fatigues quickly with gait training, but is able to perform STS with a 10# weight after brief rest. Pt would benefit from continued skilled PT to normalize gait and increase LE endurance to ambulate around his home and community more efficiently.  Plan   Continue with functional weightbearing exercises  Consider stairs, continue gait training  Progress balance      Goals    Goal 1: Increase knee extension strength to a 5/5 so patient can safely transfer sit to stand.     Tested at a 4-/5. 06/19/2020 MJM     Look at chart. 06/28/2020 MJM   Sessions: 12      Goal 2: Patient will demonstrate independence in prescribed HEP with proper form, sets and reps for safe discharge to an independent program.     Access Code: [email or web address]  URL: [email or web address]  Date: 06/12/2020  Prepared by: Alyse Low    Exercises  Seated  Hamstring Stretch with Strap - 1 x daily - 7 x weekly - 1 sets - 2 reps - 60secs hold  Goblet Squat with Kettlebell - 1 x daily - 7 x weekly - 3 sets - 10 reps  Step Up - 1 x daily - 7 x weekly - 3 sets - 10 reps  Standing Single Leg Stance with Counter Support - 1 x daily - 7 x weekly - 1 sets - 2 reps - 60secs hold     Sessions: 16      Goal 3: Increase single leg stance to 15 seconds so patient can negotiate curbs and uneven surfaces safely.     Look at chart. 06/28/2020 MJM   Sessions: 10      Goal 4: Increase hip flexion strength to a 5/5 so patient can negotiate 1 flight(s) of stairs reciprocally.    Look at chart. 06/28/2020 MJM   Sessions: 12                                Eliberto Ivory, LPTA

## 2020-07-30 ENCOUNTER — Inpatient Hospital Stay: Payer: Medicare Other | Admitting: Professional

## 2020-07-30 DIAGNOSIS — M5451 Vertebrogenic low back pain: Secondary | ICD-10-CM

## 2020-07-30 DIAGNOSIS — R2689 Other abnormalities of gait and mobility: Secondary | ICD-10-CM

## 2020-07-30 DIAGNOSIS — R262 Difficulty in walking, not elsewhere classified: Secondary | ICD-10-CM

## 2020-07-30 NOTE — PT/OT Therapy Note (Signed)
Name: Kasten Leveque Age: 87 y.o.   Date of Service: 07/30/2020  Referring Physician: Erasmo Score, MD   Date of Injury: 05/15/2020  Date Care Plan Established/Reviewed: 06/28/2020  Date Treatment Started: 06/05/2020  End of Certification Date: 09/25/2020  Sessions in Plan of Care: 16  Surgery Date: No data was found  MD Follow-up: No data was found  Medbridge Code: No data was found    Visit Count: 12   Diagnosis:   1. Balance disorder    2. Vertebrogenic low back pain    3. Difficulty in walking, not elsewhere classified      Today's treatment session was completed under the direct 1:1 supervision of the Licensed Therapist. The Licensed Therapist was present and in the room for the entire session and was fully responsible for the therapy assessment and treatment interventions while the student participated/provided services being performed. Documentation has been reviewed and approved by the Licensed Therapist via co-signature.   Eliberto Ivory, LPTA     Subjective     Daily Subjective   Patient reports no increase of walking around the house. Walking sticks felt strange to use, did not have good success at home.     Social Support/Occupation  Lives in: multiple level home  Lives with: adult children  Occupation: retired       Precautions: No data was found  Allergies: Patient has no known allergies.                  Active Range of Motion (degrees)     Lumbar   Flexion:60degrees   Extension:5degrees with pain  Left lateral flexion:23degrees with pain  Right lateral flexion:22degrees with pain  Left rotation:25degrees   Right rotation:25degrees     Passive Range of Motion (degrees)     Left Hip   Flexion: 95degrees   External rotation (90/90): 24degrees with pain  Internal rotation (90/90): 0degrees with pain    Right Hip   Flexion: 100degrees   External rotation (90/90): 35degrees   Internal rotation (90/90): 5degrees     Strength/Myotome Testing (/5)     Left Hip   Planes of Motion    Flexion (L1/2): 3- 06/28/2020 4-/5  External rotation: 3-    Right Hip   Planes of Motion   Flexion (L1/2): 3 06/28/2020 4/5  External rotation: 3-    Left Knee   Flexion (S1): 5  Extension (L3): 3+ 06/19/2020 4-/5 06/28/2020 4-/5    Right Knee   Flexion (S1): 5  Extension (L3): 3+ 06/19/2020 4-/5 06/28/2020  4-/5  Treatment     Therapeutic Exercises - Justified to address any of the following: develop strength, endurance, ROM and/or flexibility.   nustep x 5 min L3  with equipment set up and subjective obtained for treatment planning    Seated HR x 10    Standing HR c UE support x 30    Neuromuscular Re-Education - Justified to address any of the following: of movement, balance, coordination, kinesthetic sense, posture and/or proprioception for sitting and/or standing activities.   Sit to stands modified SL 3" stair 2 x 8 with verbal cuing for mechanics and posture    8" Stair climb x 10 ea LE VC for LE muscle activation and not pulling c UEs    Staggered balance c trunk rotation x 10 ea LE forward CGA c VC and HHA to perform c upright posture     Gait Training - Justified to address any of the following:  Ambulation including weight bearing variation, gait sequencing, instruction/progression of an assistive device, and/or stair climbing.    Ambulation in clinic c BL walking sticks and CGA 50' x 3  VC for narrowing BOS and forward stride    Ambulation in clinic c AD adjusted to higher height CGA 50' x 2           ---    Flowsheet Row ---   Total Time    Timed Minutes 40 minutes   Total Time 40 minutes        Assessment   Trinten demonstrates better body mechanics while ambulating since last treatment, still has wide BOS and takes steps that are more lateral than forward. Pt has better understanding of maintaining a BOS that is not excessively wide and is able to take forward steps more frequently when ambulating around the clinic. Pt demonstrates functional BLE strength, but lacks the endurance to ambulate  more than 50' or ascend stairs. Pt would benefit from continued skilled PT to improve gait pattern for efficiency.  Plan   Continue with functional weightbearing exercises  Consider stairs, continue gait training  Progress balance      Goals    Goal 1: Increase knee extension strength to a 5/5 so patient can safely transfer sit to stand.     Tested at a 4-/5. 06/19/2020 MJM     Look at chart. 06/28/2020 MJM   Sessions: 12      Goal 2: Patient will demonstrate independence in prescribed HEP with proper form, sets and reps for safe discharge to an independent program.     Access Code: [email or web address]  URL: [email or web address]  Date: 06/12/2020  Prepared by: Alyse Low    Exercises  Seated Hamstring Stretch with Strap - 1 x daily - 7 x weekly - 1 sets - 2 reps - 60secs hold  Goblet Squat with Kettlebell - 1 x daily - 7 x weekly - 3 sets - 10 reps  Step Up - 1 x daily - 7 x weekly - 3 sets - 10 reps  Standing Single Leg Stance with Counter Support - 1 x daily - 7 x weekly - 1 sets - 2 reps - 60secs hold     Sessions: 16      Goal 3: Increase single leg stance to 15 seconds so patient can negotiate curbs and uneven surfaces safely.     Look at chart. 06/28/2020 MJM   Sessions: 10      Goal 4: Increase hip flexion strength to a 5/5 so patient can negotiate 1 flight(s) of stairs reciprocally.    Look at chart. 06/28/2020 MJM   Sessions: 12                              Michelene Heady, SPTA  Eliberto Ivory, LPTA

## 2020-08-01 ENCOUNTER — Inpatient Hospital Stay: Payer: Medicare Other | Admitting: Rehabilitative and Restorative Service Providers"

## 2020-08-01 DIAGNOSIS — R2689 Other abnormalities of gait and mobility: Secondary | ICD-10-CM

## 2020-08-01 DIAGNOSIS — M5451 Vertebrogenic low back pain: Secondary | ICD-10-CM

## 2020-08-01 DIAGNOSIS — R262 Difficulty in walking, not elsewhere classified: Secondary | ICD-10-CM

## 2020-08-01 NOTE — PT/OT Therapy Note (Signed)
Name: Rick [name] Age: 87 y.o.   Date of Service: 08/01/2020  Referring Physician: Erasmo Score, MD   Date of Injury: 05/15/2020  Date Care Plan Established/Reviewed: 06/28/2020  Date Treatment Started: 06/05/2020  End of Certification Date: 09/25/2020  Sessions in Plan of Care: 16  Surgery Date: No data was found  MD Follow-up: No data was found  Medbridge Code: No data was found    Visit Count: 13   Diagnosis:   1. Balance disorder    2. Vertebrogenic low back pain    3. Difficulty in walking, not elsewhere classified      Today's treatment session was completed under the direct 1:1 supervision of the Licensed Therapist. The Licensed Therapist was present and in the room for the entire session and was fully responsible for the therapy assessment and treatment interventions while the student participated/provided services being performed. Documentation has been reviewed and approved by the Licensed Therapist via co-signature.   Rick [name], DPT     Subjective: Patient states that his balance has improved and that he feels his standing is getting better. Patient states that active walking is still difficulty for him.     Precautions: No data was found  Allergies: Patient has no known allergies.    Objective     Neuromuscular and Musculoskeletal               Active Range of Motion (degrees)      Lumbar   Flexion: 60 degrees   Extension: 5 degrees with pain  Left lateral flexion: 23 degrees with pain  Right lateral flexion: 22 degrees with pain  Left rotation: 25 degrees   Right rotation: 25 degrees      Passive Range of Motion (degrees)       Left Hip   Flexion: 95 degrees   External rotation (90/90): 24 degrees with pain  Internal rotation (90/90): 0 degrees with pain     Right Hip   Flexion: 100 degrees   External rotation (90/90): 35 degrees   Internal rotation (90/90): 5 degrees      Strength/Myotome Testing (/5)      Left Hip   Planes of Motion   Flexion (L1/2): 3- 06/28/2020 4-/5  External rotation:  3-     Right Hip   Planes of Motion   Flexion (L1/2): 3 06/28/2020 4/5  External rotation: 3-     Left Knee   Flexion (S1): 5  Extension (L3): 3+ 06/19/2020 4-/5 06/28/2020 4-/5     Right Knee   Flexion (S1): 5  Extension (L3): 3+ 06/19/2020 4-/5 06/28/2020  4-/5  Treatment     Therapeutic Exercises - Justified to address any of the following:  To develop strength, endurance, ROM and/or flexibility.   HEP reviewed to ensure patient compliance.     Seated hamstring stretch performed manually by the therapist.     Seated Hamstring stretch B 1 min with verbal cuing on correct hand placement     Standing HR 30 x with verbal cuing to go into full extension    Neuromuscular Re-Education - Justified to address any of the following:   Re-education of movement, balance, coordination, kinesthetic sense, posture and/or proprioception for sitting and/or standing activities.   Sit to stands 11 x 1, 10 x 1, and 5 x 1 with verbal cuing to engage B hip/core muscles     Step ups 12 x on each side with verbal cuing to engage quadriceps muscles  SLS one min x 1 holding on to table with verbal cuing to avoid increased use of B UEs     Gait Training - Justified to address any of the following:    Ambulation including weight bearing variation, gait sequencing, instruction/progression of an assistive device, and/or stair climbing.    Practiced ambulation around room with obstacles. Patient instructed to initiate foot clearance and hip flexion to clear obstacles.          ---      Flowsheet Row ---   Total Time    Timed Minutes 38 minutes   Total Time 38 minutes          Assessment   Rick [name] continues to show a wide base of support and short strides with ambulation, but reports that his walking and balance have improved. He requires a rest break after each exercise, suggesting decreased endurance. He required verbal cuing with the obstacle course to position his foot closer to the obstacle before stepping over it to avoid stepping on  it.  Plan   Continue with functional weightbearing exercises, continue with stairs, challenge balance, challenge obstacle course    Goals      Goal 1: Increase knee extension strength to a 5/5 so patient can safely transfer sit to stand.     Tested at a 4-/5. 06/19/2020 MJM     Look at chart. 06/28/2020 MJM   Sessions: 12      Goal 2: Patient will demonstrate independence in prescribed HEP with proper form, sets and reps for safe discharge to an independent program.     Access Code: [email or web address]  URL: [email or web address]  Date: 06/12/2020  Prepared by: Alyse Low    Exercises  Seated Hamstring Stretch with Strap - 1 x daily - 7 x weekly - 1 sets - 2 reps - 60secs hold  Goblet Squat with Kettlebell - 1 x daily - 7 x weekly - 3 sets - 10 reps  Step Up - 1 x daily - 7 x weekly - 3 sets - 10 reps  Standing Single Leg Stance with Counter Support - 1 x daily - 7 x weekly - 1 sets - 2 reps - 60secs hold     Sessions: 16      Goal 3: Increase single leg stance to 15 seconds so patient can negotiate curbs and uneven surfaces safely.     Look at chart. 06/28/2020 MJM   Sessions: 10      Goal 4: Increase hip flexion strength to a 5/5 so patient can negotiate 1 flight(s) of stairs reciprocally.    Look at chart. 06/28/2020 MJM   Sessions: 12                                Rick [name], SPTA  Rick [name], DPT

## 2020-08-07 ENCOUNTER — Inpatient Hospital Stay: Payer: Medicare Other | Admitting: Rehabilitative and Restorative Service Providers"

## 2020-08-07 DIAGNOSIS — R262 Difficulty in walking, not elsewhere classified: Secondary | ICD-10-CM

## 2020-08-07 DIAGNOSIS — R2689 Other abnormalities of gait and mobility: Secondary | ICD-10-CM

## 2020-08-07 DIAGNOSIS — M5451 Vertebrogenic low back pain: Secondary | ICD-10-CM

## 2020-08-07 NOTE — PT/OT Therapy Note (Signed)
Name: Carnel Stegman Age: 87 y.o.   Date of Service: 08/07/2020  Referring Physician: Erasmo Score, MD   Date of Injury: 05/15/2020  Date Care Plan Established/Reviewed: 06/28/2020  Date Treatment Started: 06/05/2020  End of Certification Date: 09/25/2020  Sessions in Plan of Care: 16  Surgery Date: No data was found  MD Follow-up: No data was found  Medbridge Code: No data was found    Visit Count: 14   Diagnosis:   1. Balance disorder    2. Vertebrogenic low back pain    3. Difficulty in walking, not elsewhere classified      Today's treatment session was completed under the direct 1:1 supervision of the Licensed Therapist. The Licensed Therapist was present and in the room for the entire session and was fully responsible for the therapy assessment and treatment interventions while the student participated/provided services being performed. Documentation has been reviewed and approved by the Licensed Therapist via co-signature.   Francis Dowse, DPT   Jarome Lamas, SPT    Subjective: Patient states that his balance has improved and that he feels his standing is getting better. Patient states that active walking is still difficulty for him.     Precautions: No data was found  Allergies: Patient has no known allergies.    Objective     Neuromuscular and Musculoskeletal               Active Range of Motion (degrees)      Lumbar   Flexion: 60 degrees   Extension: 5 degrees with pain  Left lateral flexion: 23 degrees with pain  Right lateral flexion: 22 degrees with pain  Left rotation: 25 degrees   Right rotation: 25 degrees      Passive Range of Motion (degrees)       Left Hip   Flexion: 95 degrees   External rotation (90/90): 24 degrees with pain  Internal rotation (90/90): 0 degrees with pain     Right Hip   Flexion: 100 degrees   External rotation (90/90): 35 degrees   Internal rotation (90/90): 5 degrees      Strength/Myotome Testing (/5)      Left Hip   Planes of Motion   Flexion (L1/2): 3- 06/28/2020  4-/5  External rotation: 3-     Right Hip   Planes of Motion   Flexion (L1/2): 3 06/28/2020 4/5  External rotation: 3-     Left Knee   Flexion (S1): 5  Extension (L3): 3+ 06/19/2020 4-/5 06/28/2020 4-/5     Right Knee   Flexion (S1): 5  Extension (L3): 3+ 06/19/2020 4-/5 06/28/2020  4-/5  Treatment     Therapeutic Exercises - Justified to address any of the following:  To develop strength, endurance, ROM and/or flexibility.   HEP reviewed to ensure patient compliance.     Seated hamstring stretch performed manually by the therapist.     Seated Hamstring stretch B 1 min with verbal cuing on correct hand placement     Standing HR 30 x with verbal cuing to go into full extension    Neuromuscular Re-Education - Justified to address any of the following:   Re-education of movement, balance, coordination, kinesthetic sense, posture and/or proprioception for sitting and/or standing activities.   Sit to stands 12 x 1, 15 x 1 with verbal cuing to engage B hip/core muscles     Step ups and step downs with gait belt 12 x on each side with verbal cuing  to engage quadriceps muscles        Gait Training - Justified to address any of the following:    Ambulation including weight bearing variation, gait sequencing, instruction/progression of an assistive device, and/or stair climbing.    Practiced ambulation around room with obstacles. Patient instructed to initiate foot clearance and hip flexion to clear obstacles.          ---      Flowsheet Row ---   Total Time    Timed Minutes 38 minutes   Total Time 38 minutes          Assessment   Jasraj shows a wide base of support and short strides with ambulation, but reports that his walking and balance have improved. He reported that ascending and descending stairs are still a challenge for him, therefore step ups and step downs were added to his treatment regimen today. He required close guarding throughout and reported fatigue afterwards, suggesting decreased endurance. He requires  verbal cuing throughout the obstacle course to step close enough to the objects to avoid stepping on them.  Plan   Continue with ascending and descending stairs    Goals      Goal 1: Increase knee extension strength to a 5/5 so patient can safely transfer sit to stand.     Tested at a 4-/5. 06/19/2020 MJM     Look at chart. 06/28/2020 MJM   Sessions: 12      Goal 2: Patient will demonstrate independence in prescribed HEP with proper form, sets and reps for safe discharge to an independent program.     Access Code: [email or web address]  URL: [email or web address]  Date: 06/12/2020  Prepared by: Alyse Low    Exercises  Seated Hamstring Stretch with Strap - 1 x daily - 7 x weekly - 1 sets - 2 reps - 60secs hold  Goblet Squat with Kettlebell - 1 x daily - 7 x weekly - 3 sets - 10 reps  Step Up - 1 x daily - 7 x weekly - 3 sets - 10 reps  Standing Single Leg Stance with Counter Support - 1 x daily - 7 x weekly - 1 sets - 2 reps - 60secs hold     Sessions: 16      Goal 3: Increase single leg stance to 15 seconds so patient can negotiate curbs and uneven surfaces safely.     Look at chart. 06/28/2020 MJM   Sessions: 10      Goal 4: Increase hip flexion strength to a 5/5 so patient can negotiate 1 flight(s) of stairs reciprocally.    Look at chart. 06/28/2020 MJM   Sessions: 12                                Michelene Heady, SPTA  Francis Dowse, DPT

## 2020-08-09 ENCOUNTER — Inpatient Hospital Stay: Payer: Medicare Other | Admitting: Rehabilitative and Restorative Service Providers"

## 2020-08-09 DIAGNOSIS — R262 Difficulty in walking, not elsewhere classified: Secondary | ICD-10-CM

## 2020-08-09 DIAGNOSIS — M5451 Vertebrogenic low back pain: Secondary | ICD-10-CM

## 2020-08-09 DIAGNOSIS — R2689 Other abnormalities of gait and mobility: Secondary | ICD-10-CM

## 2020-08-09 NOTE — Progress Notes (Addendum)
Name:Rick [name] Age: 87 y.o.   Date of Service: 08/09/2020  Referring Physician: Erasmo Score, MD   Date of Injury: 05/15/2020  Date Care Plan Established/Reviewed: 08/09/2020  Date Treatment Started: 06/05/2020  End of Certification Date: 09/25/2020  Sessions in Plan of Care: 4  Surgery Date: No data was found  MD Follow-up: No data was found  Medbridge Code: No data was found    Visit Count: 15   Diagnosis:   1. Balance disorder    2. Vertebrogenic low back pain    3. Difficulty in walking, not elsewhere classified      Discontinuation of Therapy Services    Boyce Diezel Mazur did not complete prescribed physical therapy visits.      Status is unknown at this time, physical therapy has been discontinued and patient has been discharged from care.  The last therapy note is below for review.    Please feel free to contact me with any questions regarding the care of [facility].    Sincerely,    Francis Dowse, DPT        09/24/2020         Subjective     Daily Subjective   Patient notes that his walking has improved a little bit since starting physical therapy. He notes that he is able to walk with the cane a little bit more than he did before.     Social Support/Occupation    Lives in: multiple level home    Lives with: adult children    Occupation: retired     Precautions: No data was found  Allergies: Patient has no known allergies.    Past Medical History:   Diagnosis Date    Atrial enlargement, bilateral Echo result 04/04/19    CAD (coronary artery disease) 05/28/19 Cath results.    Severe 3V CAD.  Patent LIMA to LAD, patent SVG to OM 1 and OM 2 of circumflex CA, & patent SVG to RCA.    Chronic combined systolic and diastolic heart failure 310/21 OV Dr. Penny Pia    EF=30%    Dementia Mar 2016    Difficulty walking May 2018    Post surgery of lt knee, rt femur    Dyslipidemia 310/21 OV Dr. Penny Pia    Dyspnea on exertion Jan 2020    HTN (hypertension) 310/21 OV Dr. Penny Pia    Mild cognitive  impairment 310/21 OV Dr. Penny Pia    Primary cardiomyopathy Oct 2019    RBBB 07/10/19 EKg result    S/P CABG (coronary artery bypass graft)      LIMA to LAD,  SVG to OM 1 & OM 2 of circumflex CA, & SVG to RCA.       Objective     Strength/Myotome Testing (/5)     Left Hip   Planes of Motion   Flexion (L1/2): 4+  Extension: 4    Right Hip   Planes of Motion   Flexion (L1/2): 4+  Extension: 4    Left Knee   Extension (L3): 4+    Right Knee   Extension (L3): 4+          Treatment     Therapeutic Exercises - Justified to address any of the following:  To develop strength, endurance, ROM and/or flexibility.   HEP reviewed to ensure patient compliance.    Seated hamstring stretch performed manually by the therapist.     Seated Hamstring stretch B 1 min with verbal cuing on  correct hand placement     Standing HR 30 x with verbal cuing to go into full extension       Neuromuscular Re-Education - Justified to address any of the following:   Re-education of movement, balance, coordination, kinesthetic sense, posture and/or proprioception for sitting and/or standing activities.   Sit to stands 15 x 1, 15 x 1, 5 x 1 with verbal cuing to engage B hip/core muscles     Step ups and step downs with gait belt 12 x on each side with verbal cuing to engage quadriceps muscles    Gait Training - Justified to address any of the following:    Ambulation including weight bearing variation, gait sequencing, instruction/progression of an assistive device, and/or stair climbing.    Ambulation including weight bearing variation, gait sequencing, instruction/progression of an assistive device, and/or stair climbing.    Practiced ambulation around room with obstacles. Patient instructed to initiate foot clearance and hip flexion to clear obstacles.      ---      Flowsheet Row ---   Total Time    Timed Minutes 38 minutes   Total Time 38 minutes          Assessment   Patient has been seen for 15 visits in this case from 06/05/2020 until 08/09/2020.  The primary encounter diagnosis was Balance disorder. Diagnoses of Vertebrogenic low back pain and Difficulty in walking, not elsewhere classified were also pertinent to this visit.  Patient has made good progress in strength, static balance, and dynamic balance since the initial evaluation. Patient would benefit from continued skilled PT intervention to address these deficits and achieve the below listed goals.       Barriers to therapy:     Prognosis: excellent  Patient is aware of diagnosis, prognosis and consents to plan of care: Yes  Plan   D/C patient today.     Goals      Goal 1: Increase knee extension strength to a 5/5 so patient can safely transfer sit to stand.     Tested at a 4-/5. 06/19/2020 MJM     Look at chart. 06/28/2020 MJM    08/09/2020 MJM  R: 4+/5  L: 4+/5   Sessions: 12      Goal 2: Patient will demonstrate independence in prescribed HEP with proper form, sets and reps for safe discharge to an independent program.     Access Code: [email or web address]  URL: [email or web address]  Date: 06/12/2020  Prepared by: Alyse Low    Exercises  Seated Hamstring Stretch with Strap - 1 x daily - 7 x weekly - 1 sets - 2 reps - 60secs hold  Goblet Squat with Kettlebell - 1 x daily - 7 x weekly - 3 sets - 10 reps  Step Up - 1 x daily - 7 x weekly - 3 sets - 10 reps  Standing Single Leg Stance with Counter Support - 1 x daily - 7 x weekly - 1 sets - 2 reps - 60secs hold     Sessions: 16      Goal 3: Increase single leg stance to 15 seconds so patient can negotiate curbs and uneven surfaces safely.     Look at chart. 06/28/2020 MJM   Sessions: 10      Goal 4: Increase hip flexion strength to a 5/5 so patient can negotiate 1 flight(s) of stairs reciprocally.    Look at chart. 06/28/2020 MJM    08/09/2020 MJM  R: 4+/5  L:  4+/5   Sessions: 12                                  Francis Dowse, DPT

## 2020-09-03 ENCOUNTER — Ambulatory Visit: Admission: RE | Admit: 2020-09-03 | Payer: Self-pay | Source: Ambulatory Visit

## 2020-09-19 ENCOUNTER — Encounter (INDEPENDENT_AMBULATORY_CARE_PROVIDER_SITE_OTHER): Payer: Self-pay

## 2020-10-23 ENCOUNTER — Encounter (INDEPENDENT_AMBULATORY_CARE_PROVIDER_SITE_OTHER): Payer: Self-pay | Admitting: Cardiovascular Disease

## 2020-10-23 ENCOUNTER — Ambulatory Visit (INDEPENDENT_AMBULATORY_CARE_PROVIDER_SITE_OTHER): Payer: Medicare Other | Admitting: Cardiovascular Disease

## 2020-10-23 VITALS — BP 116/63 | HR 54 | Ht 66.0 in | Wt 148.0 lb

## 2020-10-23 DIAGNOSIS — I5022 Chronic systolic (congestive) heart failure: Secondary | ICD-10-CM

## 2020-10-23 DIAGNOSIS — E7800 Pure hypercholesterolemia, unspecified: Secondary | ICD-10-CM

## 2020-10-23 DIAGNOSIS — I25810 Atherosclerosis of coronary artery bypass graft(s) without angina pectoris: Secondary | ICD-10-CM

## 2020-10-23 DIAGNOSIS — Z952 Presence of prosthetic heart valve: Secondary | ICD-10-CM

## 2020-10-23 LAB — ECG 12-LEAD
Atrial Rate: 50 {beats}/min
P Axis: -16 degrees
P-R Interval: 154 ms
Q-T Interval: 502 ms
QRS Duration: 152 ms
QTC Calculation (Bezet): 457 ms
R Axis: -19 degrees
T Axis: 31 degrees
Ventricular Rate: 50 {beats}/min

## 2020-10-23 MED ORDER — SACUBITRIL-VALSARTAN 24-26 MG PO TABS
1.0000 | ORAL_TABLET | Freq: Two times a day (BID) | ORAL | 3 refills | Status: DC
Start: 2020-10-23 — End: 2021-04-11

## 2020-10-23 MED ORDER — ASPIRIN 81 MG PO TBEC
81.00 mg | DELAYED_RELEASE_TABLET | Freq: Every day | ORAL | Status: DC
Start: 2020-10-23 — End: 2020-10-23

## 2020-10-23 NOTE — Progress Notes (Signed)
[facility] Cardiology office visit      I had the pleasure of seeing Mr. Rick [name] today for cardiovascular follow up. He is a pleasant 87 y.o. with CAD s/p CABG 2003 , cardiomyopathy LVEF of 30% likely a mixed ischemic and nonischemic, hyperlipidemia, severe AS s/p TAVR July 2021 here for continued management    Patient with history of severe AS received  TAVR: /  29 mm  Edwards Sapien7/2021  Continues to have fatigue  on walking 25 feet since then    CAD s/p CABG: In 2003  Currently no chest pain    No  dyspnea , orthopnea , PND , edema   Denies any chest pain    MEDICATIONS: He has a current medication list which includes the following prescription(s): acetaminophen - Take 2 tablets (650 mg total) by mouth every 4 (four) hours as needed for Pain, docusate sodium - Take 100 mg by mouth 2 (two) times daily   , memantine - Take 2 tablets (20 mg total) by mouth every evening, multivitamin - Take 1 capsule by mouth daily   , rosuvastatin, supplements not found in database - Take 1 tablet by mouth 2 (two) times daily Stop 3 days prior to TAVR   Super Beta plus, vitamin d - Take 2,000 Units by mouth daily   , and sacubitril-valsartan - Take 1 tablet by mouth 2 (two) times daily.    REVIEW OF SYSTEMS: All other systems reviewed and negative except as above.    PHYSICAL EXAMINATION  General Appearance: A well-appearing man in no acute distress.    Vital Signs: BP 116/63   Pulse (!) 54   Ht 1.676 m (5\' 6" )   Wt 67.1 kg (148 lb)   SpO2 97%   BMI 23.89 kg/m       Neck:  Supple without jugular venous distention.   Chest: Good air movement and respiratory effort  Bilaterally. Clear to auscultation. No wheezes, rales, or rhonchi   Cardiovascular: Normal S1 and S2 . 2/6 SM at LSB. No gallops or rub. PMI nondisplaced.   Extremities: Warm . No edema,  DP 2+ b/l   Skin: No rash,    Neuro:  Grossly intact.    Psych:  Normal mood and affect.         ECG:  sinus bradycardia ,, RBBB, occasional  PVC    ECHOCARDIOGRAM:     08/2019:  * The left ventricle is mildly dilated.    * Left ventricular systolic function is severely decreased with an ejection  fraction by Biplane Method of Discs of  30 %.    * Left ventricular wall thickness is mildly increased.    * LVOT VTI is low consistent with low stroke volume (58mL) and cardiac  output (3.3L/min).  Stroke volume index 20mL/m2.    * The right ventricular cavity size is mildly dilated.    * Decreased right ventricular systolic function.  RV FAC 11.2%, RV S' 6.6  cm/s.  TAPSE 16mm.    * There is a known 29mm Sapien 3 TAVR valve in aortic position. It appears  well seated. DVI 0.3 and EOA 1.3cm2. These values are derived from a low  stroke volume (low LVOT VTI) and may reflect an underestimation of valve area  due to low output state.    * There is mild mitral regurgitation.    * There is mild to moderate tricuspid regurgitation.    * Moderate pulmonary hypertension with estimated right ventricular systolic  pressure of  46 mmHg.    * The ascending aorta is dilated.    * Compared to the prior study, Jul 13 2019, TAVR hemodynamics have changed.  Change in DVI and EOA may be underestimated and due to low output state.          ASSESSMENT AND PLAN:    He is a pleasant 87 y.o. with CAD s/p CABG 2003 , cardiomyopathy LVEF of 30% likely a mixed ischemic and nonischemic, hyperlipidemia, severe AS s/p TAVR July 2021 here for continued management    CAD s/p  CABG in 2003  cardiac cath / 5/2021with patent LIMA to LAD ,patent SVG to OM1 , patent SVG to OM2 , patent SVG to RCA     Severe AS s/p TAVR 07/12/2019  Echo from 09/05/2019   DVI 0.3 and EOA 1.3cm2.   Continue to monitor       CM  LVEF 30%  08/2019:  Suspect mixed ischemic and nonischemic cardiomyopathy.  Given bradycardia would avoid beta-blockers.  Currently not on GDMT.  Recommend Entresto 2426 1 tablet twice daily with follow-up BMP and patient has record blood pressure readings      Hyperlipidemia tolerating statins.   Check lipids      Patient will follow up with Dr. Glean Hess who is his regular cardiologist or an NP in 2 to 3 weeks for Entresto up titration and GDMT for cardiomyopathy    All questions regarding cardiovascular diseases were answered during this encounter.      Orders Placed This Encounter   Procedures    Lipid panel    CBC and differential    ECG 12 lead     Return in about 2 weeks (around 11/06/2020).      I have reviewed external documents/results as applicable under media, reviewed records from epic including diagnostic testing and have independently interpreted  test performed by another provider as applicable           Bari Edward, MD

## 2020-10-25 ENCOUNTER — Encounter (INDEPENDENT_AMBULATORY_CARE_PROVIDER_SITE_OTHER): Payer: Self-pay

## 2020-10-25 NOTE — Progress Notes (Signed)
1 year as below    TAVR Date-07/12/19 S3 #29  RN-EB  NP-YZ     Echo-07/13/19 AVA=2.1 PV=1.9 MG=9 EF=35%  AI=Trivial PVL  MR/TR=Mild-Mod   EKG-07/13/19 SR OCC PVC's  LAD  RBBB   PRI=132ms  QRS=133ms  Med Changes- Started Plavix (30T- 3 refills)  Stopped Asa    Post op F/U- 07/18/19     EKG-07/18/19 SR W/1ST AVB  RBBB   PRI=218  QRS=158  NP/RN- YZ/eb  Med Changes-Plavix for 90 days then resume ASA 81mg     Cardiac Rehab-IAH- order placed      [employer] Card- Nothing in Media--card made & scanned to chart, copy emailed to son.       RTE 07/21/19 Pham/Morda    30D F/U          Range- 08/04/19-09/26/19  Echo-08/25/19- AVA=1.02  PV=2.37  MG=13 EF=30%  AI= MR=mild TR=mild to mod   EKG-08/25/19- SR, occ PVC's, LAD, RBBB   NP/RN- JW/SN  Labs- Done  RTE Pham/Morda 09/05/19    76yr F/U          Range-05/10/20-09/07/20  Echo-    Labs-  [facility]-after multiple attempts no return call.  RN-SN  Dr. Betti Cruz visit 9/7

## 2020-10-31 ENCOUNTER — Ambulatory Visit
Admission: RE | Admit: 2020-10-31 | Discharge: 2020-10-31 | Disposition: A | Discharge status: Home / Self Care | Payer: Medicare Other | Source: Ambulatory Visit | Attending: Cardiovascular Disease | Admitting: Cardiovascular Disease

## 2020-10-31 DIAGNOSIS — E7800 Pure hypercholesterolemia, unspecified: Secondary | ICD-10-CM | POA: Insufficient documentation

## 2020-10-31 DIAGNOSIS — I25810 Atherosclerosis of coronary artery bypass graft(s) without angina pectoris: Secondary | ICD-10-CM | POA: Insufficient documentation

## 2020-10-31 LAB — CBC AND DIFFERENTIAL
Absolute NRBC: 0 10*3/uL (ref 0.00–0.00)
Basophils Absolute Automated: 0.02 10*3/uL (ref 0.00–0.08)
Basophils Automated: 0.6 %
Eosinophils Absolute Automated: 0.11 10*3/uL (ref 0.00–0.44)
Eosinophils Automated: 3.1 %
Hematocrit: 38.1 % (ref 37.6–49.6)
Hgb: 12.5 g/dL (ref 12.5–17.1)
Immature Granulocytes Absolute: 0.01 10*3/uL (ref 0.00–0.07)
Immature Granulocytes: 0.3 %
Lymphocytes Absolute Automated: 1.03 10*3/uL (ref 0.42–3.22)
Lymphocytes Automated: 28.9 %
MCH: 34.7 pg — ABNORMAL HIGH (ref 25.1–33.5)
MCHC: 32.8 g/dL (ref 31.5–35.8)
MCV: 105.8 fL — ABNORMAL HIGH (ref 78.0–96.0)
MPV: 12 fL (ref 8.9–12.5)
Monocytes Absolute Automated: 0.42 10*3/uL (ref 0.21–0.85)
Monocytes: 11.8 %
Neutrophils Absolute: 1.97 10*3/uL (ref 1.10–6.33)
Neutrophils: 55.3 %
Nucleated RBC: 0 /100 WBC (ref 0.0–0.0)
Platelets: 83 10*3/uL — ABNORMAL LOW (ref 142–346)
RBC: 3.6 10*6/uL — ABNORMAL LOW (ref 4.20–5.90)
RDW: 13 % (ref 11–15)
WBC: 3.56 10*3/uL (ref 3.10–9.50)

## 2020-10-31 LAB — LIPID PANEL
Cholesterol / HDL Ratio: 2.4 Index
Cholesterol: 147 mg/dL (ref 0–199)
HDL: 62 mg/dL (ref 40–9999)
LDL Calculated: 76 mg/dL (ref 0–99)
Triglycerides: 47 mg/dL (ref 34–149)
VLDL Calculated: 9 mg/dL — ABNORMAL LOW (ref 10–40)

## 2020-11-08 ENCOUNTER — Encounter (INDEPENDENT_AMBULATORY_CARE_PROVIDER_SITE_OTHER): Payer: Self-pay | Admitting: Neurological Surgery

## 2020-11-15 ENCOUNTER — Encounter (INDEPENDENT_AMBULATORY_CARE_PROVIDER_SITE_OTHER): Payer: Self-pay | Admitting: Registered Nurse

## 2020-11-15 ENCOUNTER — Ambulatory Visit (INDEPENDENT_AMBULATORY_CARE_PROVIDER_SITE_OTHER): Payer: Medicare Other | Admitting: Registered Nurse

## 2020-11-15 VITALS — BP 132/55 | HR 34 | Ht 66.0 in | Wt 140.0 lb

## 2020-11-15 DIAGNOSIS — I25810 Atherosclerosis of coronary artery bypass graft(s) without angina pectoris: Secondary | ICD-10-CM

## 2020-11-15 DIAGNOSIS — I255 Ischemic cardiomyopathy: Secondary | ICD-10-CM

## 2020-11-15 DIAGNOSIS — I5022 Chronic systolic (congestive) heart failure: Secondary | ICD-10-CM

## 2020-11-15 DIAGNOSIS — Z952 Presence of prosthetic heart valve: Secondary | ICD-10-CM

## 2020-11-15 NOTE — Patient Instructions (Signed)
Have non-fasting labs drawn to check your kidney function.  If they look okay, we will call and start entresto.   Start Entresto 1/2 tablet of 24-26 mg tablet twice a day.   Please check your blood pressure twice daily: 1) in the morning and 2) at random times in the day.  Do this for 2 weeks. Bring your results to your follow up appointment.    Clinical follow up in 4 weeks

## 2020-11-15 NOTE — Progress Notes (Signed)
[facility] CARDIOLOGY OFFICE VISIT    I had the pleasure of seeing Mr. Kleinpeter today in a cardiac follow up office visit for CAD, Chronic HFrEF, cardiomyopathy (mixed), and TAVR. He is accompanied by his son, Copelan, II.     Mr. Amair Shrout is a 87 y.o. male with a past medical history significant for CAD s/p CABG 2003 , cardiomyopathy LVEF of 30% likely a mixed ischemic and nonischemic, hyperlipidemia, severe AS s/p TAVR July 2021, dementia and difficulty walking.    At today's visit Mr. Winn Dell Briner is doing well.  He denies chest pain, shortness of breath, edema, palpitations, orthopnea, PND, presyncope, or syncope.       Cardiographics:  TTE(July 2021):  LVEF 30%, mild LVH, LVOT VTI is low consistent with low stroke volume (58 mL) and cardiac output (3.3L/min).  mild dilated RV, decrease RV function, mild MR, mild-moderate TR, moderate pHTN (46 mmHg), dilated ascending aorta.       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 03/09/2019    Dry cough 03/09/2019    Nonrheumatic aortic valve stenosis 12/23/2018    Vertebrogenic low back pain 09/28/2018    Memory deficits 05/23/2018    Coronary artery disease with history of myocardial infarction without history of CABG 05/23/2018        MEDICATIONS:     Current Outpatient Medications   Medication Sig Dispense Refill    acetaminophen (TYLENOL) 325 MG tablet Take 2 tablets (650 mg total) by  mouth every 4 (four) hours as needed for Pain      docusate sodium (COLACE) 100 MG capsule Take 100 mg by mouth 2 (two) times daily         memantine (NAMENDA) 10 MG tablet Take 2 tablets (20 mg total) by mouth every evening 180 tablet 0    Multiple Vitamin (multivitamin) capsule Take 1 capsule by mouth daily         rosuvastatin (CRESTOR) 20 MG tablet       SUPPLEMENTS NOT FOUND IN DATABASE Take 1 tablet by mouth 2 (two) times daily Stop 3 days prior to TAVR   Super Beta plus      vitamin D (CHOLECALCIFEROL) 25 MCG (1000 UT) tablet Take 2,000 Units by mouth daily         sacubitril-valsartan (ENTRESTO) 24-26 MG Tab per tablet Take 1 tablet by mouth 2 (two) times daily (Patient not taking: Reported on 11/15/2020) 90 tablet 3     No current facility-administered medications for this visit.        SH:   Social History     Tobacco Use    Smoking status: Never    Smokeless tobacco: Never   Vaping Use    Vaping Use: Never used   Substance Use Topics    Alcohol use: Never    Drug use: Never       REVIEW OF SYSTEMS: All other systems reviewed and negative except as  above.    PHYSICAL EXAMINATION   General Appearance: A weak-appearing male in no acute distress. Ambulated with a cane, scoliosis  Vital Signs: BP 132/55 (BP Site: Left arm, Patient Position: Sitting, Cuff Size: Medium)   Pulse (!) 34   Ht 1.676 m (5\' 6" )   Wt 63.5 kg (140 lb)   SpO2 98%   BMI 22.60 kg/m    HEENT: Sclera anicteric, conjunctiva without pallor, moist mucous membranes, normal dentition.   Neck: Supple without jugular venous distention.  Normal carotid upstrokes without bruits.  Chest: Clear to auscultation bilaterally with good air movement and respiratory effort and no wheezes, rales, or rhonchi  Cardiovascular: Normal S1 and physiologically split S2, left systolic m murmur Grade II/V, without gallops or rub. PMI of normal size and nondisplaced.   Abdomen: Soft, nontender. No organomegaly.  No pulsatile masses or bruits.    Extremities: Warm  without edema. All peripheral pulses are full and equal.  Skin: No rash, xanthoma or xanthelasma.   Neuro: Alert and oriented x2. Grossly intact. Normal mood and affect.       Basic Metabolic Profile   Lab Results   Component Value Date    NA 142 08/25/2019    K 4.6 08/25/2019    BUN 19.0 08/25/2019    CREAT 1.2 08/25/2019    MG 1.7 07/13/2019    CA 9.7 08/25/2019    GLU 72 08/25/2019         Cardiac Biomarkers   No results found for: TROPONIN, BNP, BNP     CBC with Diff   Lab Results   Component Value Date    WBC 3.56 10/31/2020    HGB 12.5 10/31/2020    HCT 38.1 10/31/2020    PLT 83 (L) 10/31/2020         Cholesterol Panel   Lab Results   Component Value Date    CHOL 147 10/31/2020    HDL 62 10/31/2020    LDL 76 10/31/2020    TRIG 47 10/31/2020         Endocrine   Lab Results   Component Value Date    HGBA1C 4.6 07/10/2019         Coagulation Studies   Lab Results   Component Value Date    INR 1.2 (H) 07/10/2019           ASSESSMENT & PLAN:   1. S/P TAVR (transcatheter aortic valve replacement)  ECG 12 lead      2. Chronic HFrEF (heart failure with reduced ejection fraction)  Basic Metabolic Panel      3. Coronary artery disease involving coronary bypass graft of native heart without angina pectoris        4. Ischemic cardiomyopathy               *CAD s/p  CABG in 2003  cardiac cath / 06/2019 with patent LIMA to LAD ,patent SVG to OM1 , patent SVG to OM2 , patent SVG to RCA      *Severe AS s/p TAVR 07/12/2019  Echo from 09/05/2019     Continue to monitor        *CM  LVEF 30%  08/2019:  Suspect mixed ischemic and nonischemic cardiomyopathy.  Given bradycardia would avoid beta-blockers.  Currently not on GDMT.  Recommend BMP, if kidney function within range, start 1/2 tablet Entresto 2426 1 tablet twice daily. follow-up in 1 month with blood pressure readings.  Discussed important to keep daily fluid intake at  1.5 L.     *Goals of care: we discussed sodium and fluid restrictions, daily weights and blood pressure  monitor, 3/4 GDMT for treatment of HFrEF.  He and his son seem amenable with labs for kidney function, starting 1/2 tablet bid of low dose Entresto and 1 month follow up.  Discussed more aggressive management with more frequent follow up but patient and son believe this is best pace for him at this time due to stage of life and co-morbidities.       *Hyperlipidemia tolerating statins.  LDL 76      Follow up with APP in 1 month. Should follow up with Dr. Glean Hess in December.   ----------------------------  Phillis Haggis, AGNP-C  [facility]   Tel.: [phone], Fax: [phone]  [address], [place], [place] [address]  [address], [place], [place] [address]  _____________________________      Incident to service performed with physician present in the office. The physician's plan of care was implemented.

## 2020-11-18 ENCOUNTER — Ambulatory Visit
Admission: RE | Admit: 2020-11-18 | Discharge: 2020-11-18 | Disposition: A | Discharge status: Home / Self Care | Payer: Medicare Other | Source: Ambulatory Visit | Attending: Registered Nurse | Admitting: Registered Nurse

## 2020-11-18 ENCOUNTER — Encounter (INDEPENDENT_AMBULATORY_CARE_PROVIDER_SITE_OTHER): Payer: Self-pay

## 2020-11-18 DIAGNOSIS — I5022 Chronic systolic (congestive) heart failure: Secondary | ICD-10-CM | POA: Insufficient documentation

## 2020-11-18 LAB — BASIC METABOLIC PANEL
Anion Gap: 6 (ref 5.0–15.0)
BUN: 21 mg/dL (ref 9.0–28.0)
CO2: 29 mEq/L (ref 17–29)
Calcium: 9.7 mg/dL (ref 7.9–10.2)
Chloride: 106 mEq/L (ref 99–111)
Creatinine: 1.3 mg/dL (ref 0.5–1.5)
Glucose: 127 mg/dL — ABNORMAL HIGH (ref 70–100)
Potassium: 5.3 mEq/L (ref 3.5–5.3)
Sodium: 141 mEq/L (ref 135–145)

## 2020-11-18 LAB — GFR: EGFR: >60

## 2020-11-18 LAB — HEMOLYSIS INDEX: Hemolysis Index: 9 Index (ref 0–24)

## 2020-11-21 LAB — ECG 12-LEAD
Atrial Rate: 51 {beats}/min
P Axis: -14 degrees
P-R Interval: 170 ms
Q-T Interval: 480 ms
QRS Duration: 150 ms
QTC Calculation (Bezet): 442 ms
R Axis: 92 degrees
T Axis: 3 degrees
Ventricular Rate: 51 {beats}/min

## 2020-12-10 ENCOUNTER — Encounter (INDEPENDENT_AMBULATORY_CARE_PROVIDER_SITE_OTHER): Payer: Self-pay | Admitting: Neurological Surgery

## 2020-12-13 ENCOUNTER — Ambulatory Visit (INDEPENDENT_AMBULATORY_CARE_PROVIDER_SITE_OTHER): Payer: Medicare Other | Admitting: Neurological Surgery

## 2020-12-13 ENCOUNTER — Ambulatory Visit (INDEPENDENT_AMBULATORY_CARE_PROVIDER_SITE_OTHER): Payer: Medicare Other | Admitting: Registered Nurse

## 2020-12-20 ENCOUNTER — Ambulatory Visit (INDEPENDENT_AMBULATORY_CARE_PROVIDER_SITE_OTHER): Payer: Medicare Other | Admitting: Registered Nurse

## 2020-12-20 NOTE — Progress Notes (Deleted)
Rick [name]    ASSESSMENT & PLAN:   No diagnosis found.         *CAD s/p  CABG in 2003  cardiac cath / 06/2019 with patent LIMA to LAD ,patent SVG to OM1 , patent SVG to OM2 , patent SVG to RCA      *Severe AS s/p TAVR 07/12/2019  Echo from 09/05/2019     Continue to monitor        *CM  LVEF 30%  08/2019:  Suspect mixed ischemic and nonischemic cardiomyopathy.  Given bradycardia would avoid beta-blockers.  Currently not on GDMT.  Recommend BMP, if kidney function within range, start 1/2 tablet Entresto 2426 1 tablet twice daily. follow-up in 1 month with blood pressure readings.  Discussed important to keep daily fluid intake at 1.5 L.   New York Heart Association (NYHA) functional classification: {NY Heart Assn Functional Class:58459}     *Goals of care: we discussed sodium and fluid restrictions, daily weights and blood pressure monitor, 3/4 GDMT for treatment of HFrEF.  He and his son seem amenable with labs for kidney function, starting 1/2 tablet bid of low dose Entresto and 1 month follow up.  Discussed more aggressive management with more frequent follow up but patient and son believe this is best pace for him at this time due to stage of life and co-morbidities.       *Hyperlipidemia tolerating statins.  LDL 76      Follow up with APP in 1 month. Should follow up with Dr. Glean Hess in December.     HISTORY OF PRESENT ILLNESS:  I had the pleasure of seeing Rick [name] today in a cardiac follow up office [name] for CAD, Chronic HFrEF, cardiomyopathy (mixed), and TAVR. He is accompanied by his son, Rick [name], Rick [name].     Rick [name] is a 87 y.o. male with a past medical history significant for CAD s/p CABG 2003 , cardiomyopathy LVEF of 30% likely a mixed ischemic and nonischemic, hyperlipidemia, severe AS s/p TAVR July 2021, dementia and difficulty walking.    At today's [name] Rick [name] is doing well.  He denies chest pain, shortness of breath, edema, palpitations,  orthopnea, PND, presyncope, or syncope.       Cardiographics:  TTE(July 2021):  LVEF 30%, mild LVH, LVOT VTI is low consistent with low stroke volume (58 mL) and cardiac output (3.3L/min).  mild dilated RV, decrease RV function, mild MR, mild-moderate TR, moderate pHTN (46 mmHg), dilated ascending aorta.       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 03/09/2019    Dry cough 03/09/2019    Nonrheumatic aortic valve stenosis 12/23/2018    Vertebrogenic low back pain 09/28/2018    Memory deficits 05/23/2018    Coronary artery disease with history of myocardial infarction without history of CABG 05/23/2018        MEDICATIONS:     Current Outpatient Medications  Medication Sig Dispense Refill    acetaminophen (TYLENOL) 325 MG tablet Take 2 tablets (650 mg total) by mouth every 4 (four) hours as needed for Pain      docusate sodium (COLACE) 100 MG capsule Take 100 mg by mouth 2 (two) times daily         memantine (NAMENDA) 10 MG tablet Take 2 tablets (20 mg total) by mouth every evening 180 tablet 0    Multiple Vitamin (multivitamin) capsule Take 1 capsule by mouth daily         rosuvastatin (CRESTOR) 20 MG tablet       sacubitril-valsartan (ENTRESTO) 24-26 MG Tab per tablet Take 1 tablet by mouth 2 (two) times daily (Patient not taking: Reported on 11/15/2020) 90 tablet 3    SUPPLEMENTS NOT FOUND IN DATABASE Take 1 tablet by mouth 2 (two) times daily Stop 3 days  prior to TAVR   Super Beta plus      vitamin D (CHOLECALCIFEROL) 25 MCG (1000 UT) tablet Take 2,000 Units by mouth daily          No current facility-administered medications for this [name].        SH:   Social History     Tobacco Use    Smoking status: Never    Smokeless tobacco: Never   Vaping Use    Vaping Use: Never used   Substance Use Topics    Alcohol use: Never    Drug use: Never       REVIEW OF SYSTEMS: All other systems reviewed and negative except as above.    PHYSICAL EXAMINATION   General Appearance: A weak-appearing male in no acute distress. Ambulated with a cane, scoliosis  Vital Signs: There were no vitals taken for this [name].   HEENT: Sclera anicteric, conjunctiva without pallor, moist mucous membranes, normal dentition.   Neck: Supple without jugular venous distention.  Normal carotid upstrokes without bruits.  Chest: Clear to auscultation bilaterally with good air movement and respiratory effort and no wheezes, rales, or rhonchi  Cardiovascular: Normal S1 and physiologically split S2, left systolic m murmur Grade Rick [name]/V, without gallops or rub. PMI of normal size and nondisplaced.   Abdomen: Soft, nontender. No organomegaly.  No pulsatile masses or bruits.    Extremities: Warm without edema. All peripheral pulses are full and equal.  Skin: No rash, xanthoma or xanthelasma.   Neuro: Alert and oriented x2. Grossly intact. Normal mood and affect.       Basic Metabolic Profile   Lab Results   Component Value Date    NA 141 11/18/2020    K 5.3 11/18/2020    BUN 21.0 11/18/2020    CREAT 1.3 11/18/2020    MG 1.7 07/13/2019    CA 9.7 11/18/2020    GLU 127 (H) 11/18/2020         Cardiac Biomarkers   No results found for: TROPONIN, BNP, BNP     CBC with Diff   Lab Results   Component Value Date    WBC 3.56 10/31/2020    HGB 12.5 10/31/2020    HCT 38.1 10/31/2020    PLT 83 (L) 10/31/2020         Cholesterol Panel   Lab Results   Component Value Date    CHOL 147 10/31/2020    HDL 62 10/31/2020    LDL 76  10/31/2020    TRIG 47 10/31/2020         Endocrine   Lab Results  Component Value Date    HGBA1C 4.6 07/10/2019         Coagulation Studies   Lab Results   Component Value Date    INR 1.2 (H) 07/10/2019         ----------------------------  Phillis Haggis, AGNP-C  [facility]   Tel.: [phone], Fax: [phone]  [address], [place], [place] [address]  [address], [place], [place] [address]  _____________________________      Incident to service performed with physician present in the office. The physician's plan of care was implemented.

## 2020-12-30 ENCOUNTER — Encounter (INDEPENDENT_AMBULATORY_CARE_PROVIDER_SITE_OTHER): Payer: Self-pay | Admitting: Registered Nurse

## 2020-12-30 ENCOUNTER — Ambulatory Visit (INDEPENDENT_AMBULATORY_CARE_PROVIDER_SITE_OTHER): Payer: Medicare Other | Admitting: Registered Nurse

## 2020-12-30 VITALS — BP 135/79 | HR 79 | Wt 145.0 lb

## 2020-12-30 DIAGNOSIS — Z7189 Other specified counseling: Secondary | ICD-10-CM

## 2020-12-30 DIAGNOSIS — I2510 Atherosclerotic heart disease of native coronary artery without angina pectoris: Secondary | ICD-10-CM

## 2020-12-30 DIAGNOSIS — I255 Ischemic cardiomyopathy: Secondary | ICD-10-CM

## 2020-12-30 DIAGNOSIS — I252 Old myocardial infarction: Secondary | ICD-10-CM

## 2020-12-30 DIAGNOSIS — I5020 Unspecified systolic (congestive) heart failure: Secondary | ICD-10-CM

## 2020-12-30 DIAGNOSIS — Z952 Presence of prosthetic heart valve: Secondary | ICD-10-CM

## 2020-12-30 NOTE — Patient Instructions (Signed)
No change to current medications.     If blood pressure drops lower than 90 on top or patient starts to experience dizziness, hold the Entresto.

## 2020-12-30 NOTE — Progress Notes (Signed)
[name] CARDIOLOGY OFFICE VISIT    ASSESSMENT & PLAN:   1. Coronary artery disease with history of myocardial infarction without history of CABG        2. S/P TAVR (transcatheter aortic valve replacement)        3. HFrEF (heart failure with reduced ejection fraction)        4. Ischemic cardiomyopathy        5. Goals of care, counseling/discussion          *CAD s/p  CABG in 2003  cardiac cath / 06/2019 with patent LIMA to LAD ,patent SVG to OM1 , patent SVG to OM2 , patent SVG to RCA      *Severe AS s/p TAVR 07/12/2019  Echo from 09/05/2019     Continue to monitor     *CM  LVEF 30%  08/2019:  Suspect mixed ischemic and nonischemic cardiomyopathy.    Given bradycardia would avoid beta-blockers.  Currently not on GDMT.  Continue 1/2 tablet Entresto 2426 1 tablet twice daily.    New York Heart Association (NYHA) functional classification: Class III: Marked limitation of physical activity. Comfortable at rest, but less than ordinary activity causes symptoms of HF.     *Goals of care: regular weights and blood pressure (2xweek), GDMT for treatment of HFrEF as safely able to tolerate.  His son reports patient had brief dizziness while seated when first starting the [facility] but is now tolerating 1/2 tablet of 24-26 mg bid. Previous discussion about aggressive management of cardiomyopathy with frequent follow up were declined by patient and son. They believe 1/2 tablet of Entresto with quarterly visits are the best pace for him at this time due to stage of life and co-morbidities.       *Hyperlipidemia tolerating statins.  LDL 76    Return in about 3 months (around 04/01/2021) for with Dr. Glean Hess.    HISTORY OF PRESENT ILLNESS:  I had the pleasure of seeing Mr. Rick [name] today in a cardiac follow up office visit for CAD, Chronic HFrEF, cardiomyopathy (mixed), and TAVR. He is accompanied by his son, [name], II.     Mr. Rick [name] is a 87 y.o. male with a past medical history significant for CAD s/p CABG 2003 ,  cardiomyopathy LVEF of 30% likely a mixed ischemic and  nonischemic, hyperlipidemia, severe AS s/p TAVR July 2021, dementia and difficulty walking.    At today's visit Mr. [name] [name] is doing well. His son reported some presyncope while seated when initiating Entresto, but patient it tolerating now.  He denies chest pain, shortness of breath, edema, palpitations, orthopnea, PND or syncope.       Cardiographics:  TTE(July 2021):  LVEF 30%, mild LVH, LVOT VTI is low consistent with low stroke volume (58 mL) and cardiac output (3.3L/min).  mild dilated RV, decrease RV function, mild MR, mild-moderate TR, moderate pHTN (46 mmHg), dilated ascending aorta.       ECG: sinus bradycardia with PVC, RBBB no significant changes    PMH:   Patient Active Problem List    Diagnosis Date Noted    Chronic HFrEF (heart failure with reduced ejection fraction) 10/23/2020    Balance disorder 06/05/2020    Dementia without behavioral disturbance, unspecified dementia type 10/17/2019    Difficulty in walking, not elsewhere classified 10/09/2019    S/P TAVR (transcatheter aortic valve replacement) 08/16/2019    Aortic valve stenosis, etiology of cardiac valve disease unspecified 07/12/2019    Atherosclerosis of autologous vein bypass  graft(s) of other extremity with ulceration 03/09/2019    Dry cough 03/09/2019    Nonrheumatic aortic valve stenosis 12/23/2018    Vertebrogenic low back pain 09/28/2018    Memory deficits 05/23/2018    Coronary artery disease with history of myocardial infarction without history of CABG 05/23/2018        MEDICATIONS:     Current Outpatient Medications   Medication Sig Dispense Refill    acetaminophen (TYLENOL) 325 MG tablet Take 2 tablets (650 mg total) by mouth every 4 (four) hours as needed for Pain      docusate sodium (COLACE) 100 MG capsule Take 1 capsule (100 mg) by mouth 2 (two) times daily      memantine (NAMENDA) 10 MG tablet Take 2 tablets (20 mg total) by mouth every evening 180 tablet  0    Multiple Vitamin (multivitamin) capsule Take 1 capsule by mouth daily      rosuvastatin (CRESTOR) 20 MG tablet       sacubitril-valsartan (ENTRESTO) 24-26 MG Tab per tablet Take 1 tablet by mouth 2 (two) times daily (Patient taking differently: Take 1 tablet by mouth 2 (two) times daily 1/2) 90 tablet 3    SUPPLEMENTS NOT FOUND IN DATABASE Take 1 tablet by mouth 2 (two) times daily Stop 3 days prior to TAVR   Super Beta plus      vitamin D (CHOLECALCIFEROL) 25 MCG (1000 UT) tablet Take 2 tablets (2,000 Units) by mouth Once every Monday, Wednesday and Friday morning       No current facility-administered medications for this visit.        SH:   Social History     Tobacco Use    Smoking status: Never    Smokeless tobacco: Never   Vaping Use    Vaping Use: Never used   Substance Use Topics    Alcohol use: Never    Drug use: Never       REVIEW OF SYSTEMS: All other systems reviewed and negative except as above.    PHYSICAL EXAMINATION   General Appearance: A weak-appearing male in no acute distress. Ambulated with a cane, scoliosis  Vital Signs: BP 135/79 (BP Site: Left arm, Patient Position: Sitting, Cuff Size: Medium)    Pulse 79    Wt 65.8 kg (145 lb)    BMI 23.40 kg/m    HEENT: Sclera anicteric, conjunctiva without pallor, moist mucous membranes, normal dentition.   Neck: Supple without jugular venous distention.  Normal carotid upstrokes without bruits.  Chest: Clear to auscultation bilaterally with good air movement and respiratory effort and no wheezes, rales, or rhonchi  Cardiovascular: Normal S1 and physiologically split S2, left upper and lower systolic murmurs Grade II/V, without gallops or rub. PMI of normal size and nondisplaced.   Abdomen: Soft, nontender. No organomegaly.  No pulsatile masses or bruits.    Extremities: Warm without edema. All peripheral pulses are full and equal.  Skin: No rash, xanthoma or xanthelasma.   Neuro: Alert and oriented x2. Grossly intact. Normal mood and affect.        Basic Metabolic Profile   Lab Results   Component Value Date    NA 141 11/18/2020    K 5.3 11/18/2020    BUN 21.0 11/18/2020    CREAT 1.3 11/18/2020    MG 1.7 07/13/2019    CA 9.7 11/18/2020    GLU 127 (H) 11/18/2020         Cardiac Biomarkers   No results found for:  TROPONIN, BNP, BNP     CBC with Diff   Lab Results   Component Value Date    WBC 3.56 10/31/2020    HGB 12.5 10/31/2020    HCT 38.1 10/31/2020    PLT 83 (L) 10/31/2020         Cholesterol Panel   Lab Results   Component Value Date    CHOL 147 10/31/2020    HDL 62 10/31/2020    LDL 76 10/31/2020    TRIG 47 10/31/2020         Endocrine   Lab Results   Component Value Date    HGBA1C 4.6 07/10/2019         Coagulation Studies   Lab Results   Component Value Date    INR 1.2 (H) 07/10/2019         ----------------------------  Phillis Haggis, AGNP-C  [facility]   Tel.: [phone], Fax: [phone]  [address], [place], [place] [address]  [address], [place], [place] [address]  _____________________________      Incident to service performed with physician present in the office. The physician's plan of care was implemented.

## 2021-01-01 ENCOUNTER — Ambulatory Visit
Admission: RE | Admit: 2021-01-01 | Discharge: 2021-01-01 | Disposition: A | Discharge status: Home / Self Care | Payer: Medicare Other | Source: Ambulatory Visit | Attending: Cardiovascular Disease | Admitting: Cardiovascular Disease

## 2021-01-01 ENCOUNTER — Encounter (INDEPENDENT_AMBULATORY_CARE_PROVIDER_SITE_OTHER): Payer: Self-pay

## 2021-01-01 ENCOUNTER — Other Ambulatory Visit: Payer: Medicare Other

## 2021-01-01 DIAGNOSIS — I25810 Atherosclerosis of coronary artery bypass graft(s) without angina pectoris: Secondary | ICD-10-CM | POA: Insufficient documentation

## 2021-01-01 DIAGNOSIS — E7800 Pure hypercholesterolemia, unspecified: Secondary | ICD-10-CM | POA: Insufficient documentation

## 2021-01-01 LAB — CBC AND DIFFERENTIAL
Absolute NRBC: 0 10*3/uL (ref 0.00–0.00)
Basophils Absolute Automated: 0.01 10*3/uL (ref 0.00–0.08)
Basophils Automated: 0.3 %
Eosinophils Absolute Automated: 0.07 10*3/uL (ref 0.00–0.44)
Eosinophils Automated: 1.9 %
Hematocrit: 41.3 % (ref 37.6–49.6)
Hgb: 13.4 g/dL (ref 12.5–17.1)
Immature Granulocytes Absolute: 0.01 10*3/uL (ref 0.00–0.07)
Immature Granulocytes: 0.3 %
Lymphocytes Absolute Automated: 1.29 10*3/uL (ref 0.42–3.22)
Lymphocytes Automated: 34.5 %
MCH: 34.1 pg — ABNORMAL HIGH (ref 25.1–33.5)
MCHC: 32.4 g/dL (ref 31.5–35.8)
MCV: 105.1 fL — ABNORMAL HIGH (ref 78.0–96.0)
MPV: 12.4 fL (ref 8.9–12.5)
Monocytes Absolute Automated: 0.41 10*3/uL (ref 0.21–0.85)
Monocytes: 11 %
Neutrophils Absolute: 1.95 10*3/uL (ref 1.10–6.33)
Neutrophils: 52 %
Nucleated RBC: 0 /100 WBC (ref 0.0–0.0)
Platelets: 111 10*3/uL — ABNORMAL LOW (ref 142–346)
RBC: 3.93 10*6/uL — ABNORMAL LOW (ref 4.20–5.90)
RDW: 13 % (ref 11–15)
WBC: 3.74 10*3/uL (ref 3.10–9.50)

## 2021-01-01 LAB — LIPID PANEL
Cholesterol / HDL Ratio: 2.4 Index
Cholesterol: 172 mg/dL (ref 0–199)
HDL: 73 mg/dL (ref 40–9999)
LDL Calculated: 86 mg/dL (ref 0–99)
Triglycerides: 67 mg/dL (ref 34–149)
VLDL Calculated: 13 mg/dL (ref 10–40)

## 2021-01-03 ENCOUNTER — Ambulatory Visit (INDEPENDENT_AMBULATORY_CARE_PROVIDER_SITE_OTHER): Payer: Medicare Other | Admitting: Neurological Surgery

## 2021-01-07 ENCOUNTER — Encounter (INDEPENDENT_AMBULATORY_CARE_PROVIDER_SITE_OTHER): Payer: Self-pay

## 2021-02-03 ENCOUNTER — Other Ambulatory Visit: Payer: Self-pay

## 2021-02-04 ENCOUNTER — Encounter (INDEPENDENT_AMBULATORY_CARE_PROVIDER_SITE_OTHER): Payer: Self-pay

## 2021-02-14 ENCOUNTER — Encounter: Payer: Self-pay | Admitting: Neurological Surgery

## 2021-02-14 ENCOUNTER — Ambulatory Visit: Payer: Non-veteran care | Attending: Neurological Surgery | Admitting: Neurological Surgery

## 2021-02-14 VITALS — BP 107/65 | HR 53 | Temp 97.3°F | Ht 66.0 in | Wt 149.0 lb

## 2021-02-14 DIAGNOSIS — R2681 Unsteadiness on feet: Secondary | ICD-10-CM | POA: Insufficient documentation

## 2021-02-14 DIAGNOSIS — M4802 Spinal stenosis, cervical region: Secondary | ICD-10-CM | POA: Insufficient documentation

## 2021-02-14 DIAGNOSIS — G9529 Other cord compression: Secondary | ICD-10-CM | POA: Insufficient documentation

## 2021-02-14 DIAGNOSIS — R29898 Other symptoms and signs involving the musculoskeletal system: Secondary | ICD-10-CM | POA: Insufficient documentation

## 2021-02-14 NOTE — Progress Notes (Signed)
NEUROSURGERY ATTENDING - CONSULTATION NOTE      Date: 02/14/2021  Patient Name: Rick [name], Rick [name]    Please CC and send a report to:  Patient Care Team:  Erasmo Score, MD as PCP - General (Family Medicine)  Ezzard Standing Elissa Hefty, MD as Consulting Physician (Cardiology)  Fortino Sic, MD as Consulting Physician (Cardiology)  Arlyss Queen, MD PhD as Consulting Physician (Interventional Cardiology)  Genevie Ann, RN as Registered Nurse  [name], MD as Consulting Physician (Thoracic and Cardiac Surgery)  Casper Harrison, RN as Registered Nurse  Daleen Snook, NP as Nurse Practitioner (Nurse Practitioner)  Osborne Casco, Watt Climes, NP as Nurse Practitioner (Nurse Practitioner)  Bari Edward, MD as Consulting Physician (Cardiology)  Gilford Raid, NP as Nurse Practitioner (Nurse Practitioner)    Diagnosis / Chief Complaint:     Difficulty with balance    History of Present Illness:     I was asked by Dr. Jacqlyn Krauss to see [name] in consultation in order to render my professional opinion regarding the aforementioned chief complaint. He is accompanied by his son.    [name] is a 87 y.o. right-handed male with a past medical history of Atrial enlargement, bilateral (Echo result 04/04/19), CAD (coronary artery disease) (05/28/19 Cath results.), Chronic combined systolic and diastolic heart failure (310/21 OV Dr. Penny Pia), Dementia (04/2014), Difficulty walking (06/2016), Dyslipidemia (310/21 OV Dr. Penny Pia), Dyspnea on exertion (02/2018), HTN (hypertension) (310/21 OV Dr. Penny Pia), Mild cognitive impairment (310/21 OV Dr. Penny Pia), Primary cardiomyopathy (11/2017), RBBB (07/10/19 EKg result), and S/P CABG (coronary artery bypass graft). who presents with difficulty with balance.  Symptoms are better with exercise and water and are worse with lack of exercise and limited water.  His son states that he has multiple medical problems and that they were told there were  findings on his cervical MRI that he needed to follow-up with neurosurgery for.  He has not attempted any medication for his current symptoms.  He has attempted exercise and physical therapy which have improved his symptoms.  He is currently in physical therapy.  He has not undergone any injections or EMGs.  He has no previous history of spine surgery.  He denies neck pain, arm pain or leg pain.  He reports low back pain that is occasional.  He denies any low back pain today.  He denies numbness, pins-and-needles, tingling or burning.  He reports clumsiness with his hands and difficulty with balance.  He reports difficulty walking more than 15 yards due to overall fatigue.  He denies bowel/bladder dysfunction.    History was obtained from chart review and the patient.    I, Zoe Textoris, was acting as Neurosurgeon for provider Jonathon Jordan, MD Janetta Hora, on the current patient's note.    Review of Systems:     A comprehensive review of systems was performed and revealed the following:        Color Code  Yellow - Numbness sensation  Purple - Pins & Needles sensation  Red     - Burning sensation  Blue    - Aching pain  Green Lambert Mody and Stabbing pain  Manson Passey - Other    Past Medical History:     Past Medical History:   Diagnosis Date    Atrial enlargement, bilateral Echo result 04/04/19    CAD (coronary artery disease) 05/28/19 Cath results.    Severe 3V CAD.  Patent LIMA to LAD, patent SVG to OM 1 and OM  2 of circumflex CA, & patent SVG to RCA.    Chronic combined systolic and diastolic heart failure 310/21 OV Dr. Penny Pia    EF=30%    Dementia 04/2014    Difficulty walking 06/2016    Post surgery of lt knee, rt femur    Dyslipidemia 310/21 OV Dr. Penny Pia    Dyspnea on exertion 02/2018    HTN (hypertension) 310/21 OV Dr. Penny Pia    Mild cognitive impairment 310/21 OV Dr. Penny Pia    Primary cardiomyopathy 11/2017    RBBB 07/10/19 EKg result    S/P CABG (coronary artery bypass graft)      LIMA to LAD,  SVG to OM 1 & OM 2 of  circumflex CA, & SVG to RCA.       Past Surgical History:     Past Surgical History:   Procedure Laterality Date    CORONARY ARTERY BYPASS GRAFT  2003     approx year: LIMA to LAD,  SVG to OM 1 & OM 2 of circumflex CA, & SVG to RCA    FRACTURE SURGERY Right 06/2016    femur    PERCUTANEOUS VALVULOPLASTY - AORTIC N/A 07/12/2019    Procedure: TAVR #2;  Surgeon: Arlyss Queen, MD PhD;  Location: [facility];  Service: Cardiovascular;  Laterality: N/A;  23 HR OB    REPLACEMENT, AORTIC VALVE , EDWARDS SAPIEN N/A 07/12/2019    Procedure: REPLACEMENT, AORTIC VALVE , EDWARDS SAPIEN -- TAVR Valve:   S3 29mm -- Access: RTF -- IC: Dr. Penny Pia;  Surgeon: Christene Lye, MD;  Location: [facility] HEART OR;  Service: Cardiothoracic;  Laterality: N/A;    RIGHT & LEFT HEART CATH POSSIBLE PCI N/A 05/26/2019    Procedure: RIGHT & LEFT HEART CATH POSSIBLE PCI;  Surgeon: Arlyss Queen, MD PhD;  Location: [facility];  Service: Cardiovascular;  Laterality: N/A;  [id number] scheduled with Maira in dr's office. OP. kr    TAVR PERCUTANEOUS FEMORAL  07/13/2019         TOTAL KNEE ARTHROPLASTY Left 2017    TOTAL KNEE ARTHROPLASTY Right 2005       Family History:     Family History   Problem Relation Age of Onset    No known problems Mother     No known problems Father     No known problems Sister     No known problems Brother        Social History:     Social History     Socioeconomic History    Marital status: Widowed     Spouse name: None    Number of children: None    Years of education: None    Highest education level: None   Occupational History    None   Tobacco Use    Smoking status: Never    Smokeless tobacco: Never   Vaping Use    Vaping Use: Never used   Substance and Sexual Activity    Alcohol use: Never    Drug use: Never    Sexual activity: Not Currently     Partners: Female     Birth control/protection: None   Other Topics Concern    None   Social History Narrative    None     Social Determinants of Health      Financial Resource Strain: Low Risk     Difficulty of Paying Living Expenses: Not hard at all   Food Insecurity: No Food Insecurity  Worried About Programme researcher, broadcasting/film/video in the Last Year: Never true    [employer] of Food in the Last Year: Never true   Transportation Needs: No Transportation Needs    Lack of Transportation (Medical): No    Lack of Transportation (Non-Medical): No   Physical Activity: Insufficiently Active    Days of Exercise per Week: 6 days    Minutes of Exercise per Session: 10 min   Stress: No Stress Concern Present    Feeling of Stress : Not at all   Social Connections: Unknown    Frequency of Communication with Friends and Family: Once a week    Frequency of Social Gatherings with Friends and Family: Patient refused    Attends Religious Services: Patient refused    Database administrator or Organizations: No    Attends Banker Meetings: Never    Marital Status: Widowed   Catering manager Violence: Not At Risk    Fear of Current or Ex-Partner: No    Emotionally Abused: No    Physically Abused: No    Sexually Abused: No   Housing Stability: Low Risk     Unable to Pay for Housing in the Last Year: No    Number of Places Lived in the Last Year: 1    Unstable Housing in the Last Year: No     He is widowed. He has 3 children. Patient denies alcohol use, smoking, tobacco product use or recreational drug use.       Allergies:     No Known Allergies    Medications:     Current Outpatient Medications on File Prior to Visit   Medication Sig Dispense Refill    acetaminophen (TYLENOL) 325 MG tablet Take 2 tablets (650 mg total) by mouth every 4 (four) hours as needed for Pain      docusate sodium (COLACE) 100 MG capsule Take 1 capsule (100 mg) by mouth 2 (two) times daily      memantine (NAMENDA) 10 MG tablet Take 2 tablets (20 mg total) by mouth every evening 180 tablet 0    Multiple Vitamin (multivitamin) capsule Take 1 capsule by mouth daily      rosuvastatin (CRESTOR) 20 MG tablet        sacubitril-valsartan (ENTRESTO) 24-26 MG Tab per tablet Take 1 tablet by mouth 2 (two) times daily (Patient taking differently: Take 1 tablet by mouth 2 (two) times daily 1/2) 90 tablet 3    SUPPLEMENTS NOT FOUND IN DATABASE Take 1 tablet by mouth 2 (two) times daily Stop 3 days prior to TAVR   Super Beta plus      vitamin D (CHOLECALCIFEROL) 25 MCG (1000 UT) tablet Take 2 tablets (2,000 Units) by mouth Once every Monday, Wednesday and Friday morning       No current facility-administered medications on file prior to visit.       Vital Signs:     Vitals:    02/14/21 1458   BP: 107/65   Pulse: (!) 53   Temp: 97.3 F (36.3 C)       Physical Exam:     General: No acute distress, cooperative with examination  Psychologic: Affect appropriate, judgment and insight consistent with situation, no delusions or hallucinations  Skin: Warm, dry, no obvious lesions   Eyes: Sclerae anicteric, no conjunctival injection  ENT: No visible otorrhea, no rhinorrhea, trachea midline  Head: Normocephalic  Neck: No palpable masses  Musculoskeletal: Full ROM, normal muscle tone, atrophy of  bilateral IO musculature   Pulmonary: Normal respiratory effort, no audible wheezing  Cardiovascular: No pedal edema, pulses 2+ in bilat lower extremities  Abdominal: Non-tender to palpation, non-distended, no organomegaly, no palpable masses    Neuro exam:   Awake, alert, orientedx3  Speech clear and fluent  Attention span normal  PERRL, EOMI  Facial sensation intact  Face symmetric  Hearing intact  Tongue midline  Shoulder shrug strong bilaterally  Motor:   Arms:      Deltoid  Bicep Tricep Grip IO   Right 5 5 5 5 5    Left  5 5 5 5  4+       Legs:      HF KE KF DF PF EHL   Right 5 5 5 5 5 5    Left  5 5 5 5 5 5      Light touch and pinprick intact in all 4 extremities   DTRs:      Biceps Triceps Brachiorad Patellar Ankle   Right 3+ 2+ 2+ 1+ 2+   Left  3+ 2+ 2+ 1+ 2+     No Hoffmann's sign bilat  No Clonus bilat  Wide based, unsteady gait. In wheelchair but  is able to ambulate with assistance from chair to exam table.   unable to tandem gait  able to toe and heel stand with assistance    Labs:     Lab Results   Component Value Date    WBC 3.74 01/01/2021    HGB 13.4 01/01/2021    HCT 41.3 01/01/2021    MCV 105.1 (H) 01/01/2021    PLT 111 (L) 01/01/2021     Lab Results   Component Value Date    NA 141 11/18/2020    K 5.3 11/18/2020    CL 106 11/18/2020    CO2 29 11/18/2020     Lab Results   Component Value Date    INR 1.2 (H) 07/10/2019    INR 1.1 05/17/2019    PT 13.4 (H) 07/10/2019    PT 13.1 (H) 05/17/2019     Lab Results   Component Value Date    BUN 21.0 11/18/2020     Lab Results   Component Value Date    CREAT 1.3 11/18/2020       Imaging:     I reviewed the patient's imaging myself. My own interpretation of the MRI of cervical spine without contrast that shows severe multilevel spondylosis throughout the cervical spine.  There are disc herniations at C4-C5 > C3-C4 resulting in moderate to severe spinal cord compression with no evidence of myelomalacia or cord signal change.      Assessment:     87 y.o. male presenting with worsening balance for the past 2 years.  Imaging reveals moderate to severe central stenosis and spinal cord compression at C3-C5 with no evidence of myelomalacia.  On examination, the patient had an unsteady gait and left IO weakness.      Plan:     I had an extensive discussion with the patient and his son regarding the condition. I showed them the imaging, and went over its findings in detail. I explained to them that the patient has severe stenosis in cervical spine and compression of the spinal cord, which is most likely cause for his symptoms of myelopathy and his difficulty with balance.  I explained to the patient that treatment would involve posterior cervical decompression and fusion surgery to decompress the spinal cord and stabilize the cervical  spine.    I explained to the patient the risks of surgical intervention, which include  but are not limited to death, infection, bleeding, spinal cord injury, nerve injury, weakness, paresthesias, chronic pain, bowel/bladder dysfunction, CSF leak, pseudomeningocele formation, vascular injury, heart/lung complications from anesthesia, failure of surgery, failure of instrumentation, need for another surgery in the future. The patient voiced good understanding of these risks.  He would like to proceed with conservative treatment option at this time and not with surgery.  For this reason, I referred the patient for physical therapy.  All questions were answered.    My above assessment and recommendations were also communicated to the patient's referring physician, Dr Georges Mouse.    Thank you for the opportunity of allowing me to participate in the care of Mr. Mallie [employer].    I, Jonathon Jordan, MD Janetta Hora, personally performed the services documented. Zoe Textoris, PA-C, is scribing for me on the current patient's note. This note and the patient instructions accurately reflect work and decisions made by me, Jonathon Jordan, MD Janetta Hora.    Hoyle Sauer. Mirra Basilio, MD FAANS  Section Chief of Neurological Surgery  [facility]  Minimally Invasive and Complex Spine Surgery & General Neurosurgery  Department of Neurosciences  [facility]  Assistant Professor of Neurosurgery  [facility]  Address: [address], [address]  [place], [place] [address]  Phone [phone]  Fax [phone]

## 2021-02-14 NOTE — Progress Notes (Signed)
Review of Systems   Review of Systems   Constitution: Negative. Negative for activity change, appetite change, chills, diaphoresis/sweating, fever, weight gain and weight loss.   HENT:  Positive for postnasal drip. Negative for facial swelling, nosebleeds, rhinorrhea, sore throat, tinnitus and voice change.    Eyes: Negative.  Negative for pain.   Cardiovascular: Negative.     Negative for chest pain, leg swelling and palpitations.        Respiratory:  Positive for cough.    Endocrine: Negative.    Hematologic/Lymphatic: Negative.  Negative for adenopathy and swollen glands. No easy bleeding and no easy bruising.   Skin: Negative.  Negative for color change, rash and suspicious lesions.   Musculoskeletal:  Positive for myalgias. Negative for joint swelling.   Gastrointestinal: Negative.  Negative for abdominal pain, constipation, diarrhea, nausea and vomiting.   Genitourinary:  Positive for frequency and urgency. Negative for bladder incontinence and dysuria.   Neurological: Negative.  Negative for headaches and seizures.   Psychiatric/Behavioral: Negative.  Negative for depression, hallucinations, hypervigilance and suicidal ideas. The patient is not nervous/anxious.    Allergic/Immunologic: Negative.    All other systems reviewed and are negative.    Taken by: Cleda Clarks, LPN

## 2021-02-24 NOTE — Progress Notes (Signed)
Patient son Caleb called from [phone] to confirm this PT Order    Mervin Kung, RN BSN  [facility] Triage  [phone]      PT ORDER    Electronically signed by: Cleda Clarks, LPN Lic #  < Not on File > NPI:  < Not on File >   Electronically signed by: Nanda Quinton, MD Lic #  < Not on File > NPI: [id number]            [facility] - A Service of [facility]   [address] [place] [address]   [place] [address]   Ph:  [phone]      Fax: [phone] Date:   Name:   Address:     H:   DOB:   02/14/2019   Rick [name] [address]   [address]   [place], [place]  [address]   [phone]   1924/10/11           Allergies:  < No Known Allergies >        Reprint Order Requisition          Referral to Physical Therapy - EXTERNAL (Order #[id number]) on 02/14/21         Referral Details          Referred By  Referred To     [facility]   [address]   [place] [address]   Phone: [phone]   Fax: [phone]    Diagnoses: Unsteady gait   Order: Referral To Physical Therapy - External   Reason: Specialty Services Required         Comment: Ambulatory Physical therapy 1-3x/week for 6 weeks.  Work on core strengthening, gait and endurance.  Active and passive ROM as tolerated, soft tissue massage as tolerated, joint mobilization as tolerated.  Modalities of choice. WBAT BUE/BLE.         Comments          Ambulatory Physical therapy 1-3x/week for 6 weeks.  Work on core strengthening, gait and endurance.  Active and passive ROM as tolerated, soft tissue massage as tolerated, joint mobilization as tolerated.  Modalities of choice. WBAT BUE/BLE.              Associated Diagnoses          Unsteady gait [R26.81]  - Primary                 This document serves as a request of services and does not constitute insurance authorization or approval of services. To determine eligibility, please contact the members insurance carrier to verify  and review coverage.     If you have any medical questions regarding this request for services, please contact the ordering department at the number in the header between the hours of 8:30am-5:00pm (Mon-Fri).               Electronically signed by: Cleda Clarks, LPN Lic #  < Not on File >     Authorized by: Nanda Quinton, MD Lic #  < Not on File >         Order Information:    Procedure: Department: Signature Date:    AMB REFERRAL TO PHYSICAL THERAPY [215] Erasmo Downer [[id number]] 02/14/2021            Authorizing: Order Type:   Nanda Quinton, MD Outpatient Referral [8]  Patient Information:   Name: Rick [name], Rick [name]   Gender: Male   SSN: [id number]   Patient ID: [id number]    Date of Birth: 1924/09/03 (89 years)   Phone: [phone]   Address: [address]     [place] [address]         Department    Name Address Phone Fax   [facility] Neurosurgery - A Service of [facility] [address] [place] [address]   [place] [address] [phone]     Referral Details    Referred By  Referred To   [facility]   [address]   [place] [address]   Phone: [phone]   Fax: [phone]    Diagnoses: Unsteady gait   Order: Referral To Physical Therapy - External   Reason: Specialty Services Required       Comment: Ambulatory Physical therapy 1-3x/week for 6 weeks.  Work on core strengthening, gait and endurance.  Active and passive ROM as tolerated, soft tissue massage as tolerated, joint mobilization as tolerated.  Modalities of choice. WBAT BUE/BLE.     Primary Visit Coverage    Payer Plan Sponsor Code Group Number Group Name   [employer] [place]  [id number]        Primary Visit Coverage Subscriber    Subscriber ID Subscriber Name Subscriber Address   [id number] [facility] [address]     [place], [place] [address]       Associated Diagnoses    Unsteady gait [R26.81]  -  Primary         Comments    Ambulatory Physical therapy 1-3x/week for 6 weeks.  Work on core strengthening, gait and endurance.  Active and passive ROM as tolerated, soft tissue massage as tolerated, joint mobilization as tolerated.  Modalities of choice. WBAT BUE/BLE.          Electronically signed by: Cleda Clarks, LPN Lic #  < Not on File > NPI:  < Not on File >   Electronically signed by: Nanda Quinton, MD Lic #  < Not on File > NPI: [id number]            Verbal Order Info    Action Created on Order Mode Entered by Responsible Provider Signed by Signed on   Ordering 02/14/21 1511 Transcribed from written order Cleda Clarks, LPN        Electronically signed by: Cleda Clarks, LPN Lic #  < Not on File > NPI:  < Not on File >   Electronically signed by: Nanda Quinton, MD Lic #  < Not on File > NPI: [id number]

## 2021-04-08 ENCOUNTER — Emergency Department
Admission: EM | Admit: 2021-04-08 | Discharge: 2021-04-08 | Payer: Non-veteran care | Attending: Internal Medicine | Admitting: Internal Medicine

## 2021-04-08 ENCOUNTER — Emergency Department: Payer: Non-veteran care

## 2021-04-08 ENCOUNTER — Inpatient Hospital Stay
Admission: AD | Admit: 2021-04-08 | Discharge: 2021-04-11 | DRG: 175 | Disposition: A | Discharge status: Home Health | Payer: Medicare Other | Source: Ambulatory Visit | Attending: Internal Medicine | Admitting: Internal Medicine

## 2021-04-08 DIAGNOSIS — I5023 Acute on chronic systolic (congestive) heart failure: Secondary | ICD-10-CM | POA: Diagnosis present

## 2021-04-08 DIAGNOSIS — I428 Other cardiomyopathies: Secondary | ICD-10-CM | POA: Diagnosis present

## 2021-04-08 DIAGNOSIS — N390 Urinary tract infection, site not specified: Secondary | ICD-10-CM | POA: Diagnosis present

## 2021-04-08 DIAGNOSIS — I2609 Other pulmonary embolism with acute cor pulmonale: Principal | ICD-10-CM | POA: Diagnosis present

## 2021-04-08 DIAGNOSIS — Z79899 Other long term (current) drug therapy: Secondary | ICD-10-CM | POA: Insufficient documentation

## 2021-04-08 DIAGNOSIS — Z20822 Contact with and (suspected) exposure to covid-19: Secondary | ICD-10-CM | POA: Insufficient documentation

## 2021-04-08 DIAGNOSIS — I371 Nonrheumatic pulmonary valve insufficiency: Secondary | ICD-10-CM | POA: Diagnosis present

## 2021-04-08 DIAGNOSIS — E785 Hyperlipidemia, unspecified: Secondary | ICD-10-CM | POA: Diagnosis present

## 2021-04-08 DIAGNOSIS — I071 Rheumatic tricuspid insufficiency: Secondary | ICD-10-CM | POA: Diagnosis present

## 2021-04-08 DIAGNOSIS — R079 Chest pain, unspecified: Secondary | ICD-10-CM

## 2021-04-08 DIAGNOSIS — I2510 Atherosclerotic heart disease of native coronary artery without angina pectoris: Secondary | ICD-10-CM | POA: Diagnosis present

## 2021-04-08 DIAGNOSIS — Z96653 Presence of artificial knee joint, bilateral: Secondary | ICD-10-CM | POA: Diagnosis present

## 2021-04-08 DIAGNOSIS — I472 Ventricular tachycardia, unspecified: Secondary | ICD-10-CM | POA: Diagnosis not present

## 2021-04-08 DIAGNOSIS — F0390 Unspecified dementia without behavioral disturbance: Secondary | ICD-10-CM | POA: Diagnosis present

## 2021-04-08 DIAGNOSIS — I272 Pulmonary hypertension, unspecified: Secondary | ICD-10-CM | POA: Diagnosis present

## 2021-04-08 DIAGNOSIS — Z951 Presence of aortocoronary bypass graft: Secondary | ICD-10-CM

## 2021-04-08 DIAGNOSIS — I110 Hypertensive heart disease with heart failure: Secondary | ICD-10-CM | POA: Diagnosis present

## 2021-04-08 DIAGNOSIS — R001 Bradycardia, unspecified: Secondary | ICD-10-CM | POA: Diagnosis present

## 2021-04-08 DIAGNOSIS — Z952 Presence of prosthetic heart valve: Secondary | ICD-10-CM

## 2021-04-08 DIAGNOSIS — E875 Hyperkalemia: Secondary | ICD-10-CM | POA: Diagnosis not present

## 2021-04-08 LAB — CBC AND DIFFERENTIAL
Absolute NRBC: 0 10*3/uL (ref 0.00–0.00)
Basophils Absolute Automated: 0.02 10*3/uL (ref 0.00–0.08)
Basophils Automated: 0.6 %
Eosinophils Absolute Automated: 0.11 10*3/uL (ref 0.00–0.44)
Eosinophils Automated: 3.2 %
Hematocrit: 35.6 % — ABNORMAL LOW (ref 37.6–49.6)
Hgb: 11.6 g/dL — ABNORMAL LOW (ref 12.5–17.1)
Immature Granulocytes Absolute: 0 10*3/uL (ref 0.00–0.07)
Immature Granulocytes: 0 %
Instrument Absolute Neutrophil Count: 2.25 10*3/uL (ref 1.10–6.33)
Lymphocytes Absolute Automated: 0.7 10*3/uL (ref 0.42–3.22)
Lymphocytes Automated: 20.3 %
MCH: 33.9 pg — ABNORMAL HIGH (ref 25.1–33.5)
MCHC: 32.6 g/dL (ref 31.5–35.8)
MCV: 104.1 fL — ABNORMAL HIGH (ref 78.0–96.0)
MPV: 10.9 fL (ref 8.9–12.5)
Monocytes Absolute Automated: 0.36 10*3/uL (ref 0.21–0.85)
Monocytes: 10.5 %
Neutrophils Absolute: 2.25 10*3/uL (ref 1.10–6.33)
Neutrophils: 65.4 %
Nucleated RBC: 0 /100 WBC (ref 0.0–0.0)
Platelets: 85 10*3/uL — ABNORMAL LOW (ref 142–346)
RBC: 3.42 10*6/uL — ABNORMAL LOW (ref 4.20–5.90)
RDW: 15 % (ref 11–15)
WBC: 3.44 10*3/uL (ref 3.10–9.50)

## 2021-04-08 LAB — COMPREHENSIVE METABOLIC PANEL
ALT: 22 U/L (ref 0–55)
AST (SGOT): 26 U/L (ref 5–41)
Albumin/Globulin Ratio: 1 (ref 0.9–2.2)
Albumin: 3.2 g/dL — ABNORMAL LOW (ref 3.5–5.0)
Alkaline Phosphatase: 69 U/L (ref 37–117)
Anion Gap: 9 (ref 5.0–15.0)
BUN: 26 mg/dL (ref 9.0–28.0)
Bilirubin, Total: 0.7 mg/dL (ref 0.2–1.2)
CO2: 24 mEq/L (ref 17–29)
Calcium: 9.2 mg/dL (ref 7.9–10.2)
Chloride: 114 mEq/L — ABNORMAL HIGH (ref 99–111)
Creatinine: 1.3 mg/dL (ref 0.5–1.5)
Globulin: 3.2 g/dL (ref 2.0–3.6)
Glucose: 119 mg/dL — ABNORMAL HIGH (ref 70–100)
Potassium: 4.5 mEq/L (ref 3.5–5.3)
Protein, Total: 6.4 g/dL (ref 6.0–8.3)
Sodium: 147 mEq/L — ABNORMAL HIGH (ref 135–145)

## 2021-04-08 LAB — CBC
Absolute NRBC: 0 10*3/uL (ref 0.00–0.00)
Hematocrit: 36.9 % — ABNORMAL LOW (ref 37.6–49.6)
Hgb: 12 g/dL — ABNORMAL LOW (ref 12.5–17.1)
MCH: 33.9 pg — ABNORMAL HIGH (ref 25.1–33.5)
MCHC: 32.5 g/dL (ref 31.5–35.8)
MCV: 104.2 fL — ABNORMAL HIGH (ref 78.0–96.0)
MPV: 11.6 fL (ref 8.9–12.5)
Nucleated RBC: 0 /100 WBC (ref 0.0–0.0)
Platelets: 84 10*3/uL — ABNORMAL LOW (ref 142–346)
RBC: 3.54 10*6/uL — ABNORMAL LOW (ref 4.20–5.90)
RDW: 14 % (ref 11–15)
WBC: 3.16 10*3/uL (ref 3.10–9.50)

## 2021-04-08 LAB — PROBNP: NT-proBNP: 3112 pg/mL — ABNORMAL HIGH (ref 0–450)

## 2021-04-08 LAB — ANTI-XA,UFH: Anti-Xa, UFH: <0.04 IU/mL

## 2021-04-08 LAB — URINALYSIS REFLEX TO MICROSCOPIC EXAM - REFLEX TO CULTURE
Blood, UA: NEGATIVE
Glucose, UA: NEGATIVE
Ketones UA: NEGATIVE
Leukocyte Esterase, UA: NEGATIVE
Nitrite, UA: NEGATIVE
Protein, UR: 30 — AB
Specific Gravity UA: >=1.03 (ref 1.001–1.035)
Urine pH: 6 (ref 5.0–8.0)
Urobilinogen, UA: 1 mg/dL (ref 0.2–2.0)

## 2021-04-08 LAB — COVID-19 (SARS-COV-2) & INFLUENZA  A/B, NAA (ROCHE LIAT)
Influenza A: NOT DETECTED
Influenza B: NOT DETECTED
SARS CoV 2 Overall Result: NOT DETECTED

## 2021-04-08 LAB — HIGH SENSITIVITY TROPONIN-I: hs Troponin-I: 33.2 ng/L

## 2021-04-08 LAB — IHS D-DIMER: D-Dimer: 2.05 ug/mL FEU — ABNORMAL HIGH (ref 0.00–0.60)

## 2021-04-08 LAB — APTT: PTT: 36 s (ref 27–39)

## 2021-04-08 LAB — CELL MORPHOLOGY
Cell Morphology: NORMAL
Platelet Estimate: NORMAL

## 2021-04-08 LAB — GFR: EGFR: >60

## 2021-04-08 MED ORDER — ALBUTEROL SULFATE (2.5 MG/3ML) 0.083% IN NEBU
2.5000 mg | INHALATION_SOLUTION | Freq: Once | RESPIRATORY_TRACT | Status: AC
Start: 2021-04-08 — End: 2021-04-08
  Administered 2021-04-08: 2.5 mg via RESPIRATORY_TRACT
  Filled 2021-04-08: qty 3

## 2021-04-08 MED ORDER — IPRATROPIUM BROMIDE 0.02 % IN SOLN
0.5000 mg | Freq: Once | RESPIRATORY_TRACT | Status: AC
Start: 2021-04-08 — End: 2021-04-08
  Administered 2021-04-08: 0.5 mg via RESPIRATORY_TRACT
  Filled 2021-04-08: qty 2.5

## 2021-04-08 MED ORDER — IOHEXOL 350 MG/ML IV SOLN
100.0000 mL | Freq: Once | INTRAVENOUS | Status: AC | PRN
Start: 2021-04-08 — End: 2021-04-08
  Administered 2021-04-08: 100 mL via INTRAVENOUS

## 2021-04-08 MED ORDER — HEPARIN (PORCINE) IN D5W 50-5 UNIT/ML-% IV SOLN (UNITS/KG/HR ONLY)
18.0000 [IU]/kg/h | INTRAVENOUS | Status: DC
Start: 2021-04-08 — End: 2021-04-08
  Administered 2021-04-08: 18 [IU]/kg/h via INTRAVENOUS
  Filled 2021-04-08: qty 500

## 2021-04-08 MED ORDER — METHYLPREDNISOLONE SODIUM SUCC 125 MG IJ SOLR
125.0000 mg | Freq: Once | INTRAMUSCULAR | Status: AC
Start: 2021-04-08 — End: 2021-04-08
  Administered 2021-04-08: 125 mg via INTRAVENOUS
  Filled 2021-04-08: qty 2

## 2021-04-08 NOTE — ED Provider Notes (Signed)
Chief Complaint   Patient presents with    Cough    Hemoptysis    Congestion       History of Present Illness:  Rick [name] is a 88 y.o. male with a past medical history of hypertension, CHF, dementia, CAD status post 1 stent, cardiomyopathy, CABG presents ED for evaluation of cough, congestion.  Patient's family in the room states the patient has been experiencing cough, congestion, wheezing, mild mopped assist since Thursday.  Denies chest pain or abdominal pain.  No ill contacts.  Patient has no history of respiratory illness.  Been compliant with medications.  Does have history of CABG and stent placement.  Denies fevers.  No recent trauma or fall.  Patient vaccinated for COVID.      Patient denies fever, chills, dizziness, lightheadedness, headache, loss of appetite, vision changes, sore throat, palpitations, chest pain, shortness of breath, abdominal pain, dysuria, hematuria, increased or decreased urinary frequency or urgency, nausea, vomiting, diarrhea, constipation, hematochezia, melena, skin changes/rashes, new back pain, new neck pain, joint pain, generalized weakness, focal weakness, numbness, tingling, mood changes.    Past History   Past Medical History--reviewed, as per HPI  Past Surgical History--reviewed, no relevant history  Family History--reviewed, no relevant history  Social History--reviewed, no relevant history  Allergies reviewed and documented as pertinent to the case.     Home Medications               acetaminophen (TYLENOL) 325 MG tablet     Take 2 tablets (650 mg total) by mouth every 4 (four) hours as needed for Pain     docusate sodium (COLACE) 100 MG capsule     Take 1 capsule (100 mg) by mouth 2 (two) times daily     memantine (NAMENDA) 10 MG tablet     Take 2 tablets (20 mg total) by mouth every evening     Multiple Vitamin (multivitamin) capsule     Take 1 capsule by mouth daily     rosuvastatin (CRESTOR) 20 MG tablet          sacubitril-valsartan (ENTRESTO) 24-26 MG  Tab per tablet     Take 1 tablet by mouth 2 (two) times daily     Patient taking differently: Take 1 tablet by mouth 2 (two) times daily 1/2     SUPPLEMENTS NOT FOUND IN DATABASE     Take 1 tablet by mouth 2 (two) times daily Stop 3 days prior to TAVR   Super Beta plus     vitamin D (CHOLECALCIFEROL) 25 MCG (1000 UT) tablet     Take 2 tablets (2,000 Units) by mouth Once every Monday, Wednesday and Friday morning            REVIEW OF SYSTEMS:  Otherwise as per HPI.  All other systems reviewed and negative unless otherwise noted above.    PHYSICAL EXAM:    ED Triage Vitals [04/08/21 1334]   Enc Vitals Group      BP 134/87      Heart Rate 76      Resp Rate 16      Temp 98.5 F (36.9 C)      Temp Source Oral      SpO2 97 %      Weight 67.6 kg      Height 1.676 m      Head Circumference       Peak Flow       Pain Score 0  Pain Loc       Pain Edu?       Excl. in GC?        CONSTITUTIONAL:  Well appearing, no apparent distress.   EYES: Pupils are equally round and reactive to light. There is no evidence of conjunctival pallor. There is no evidence of conjunctival injection.  HENT: Normocephalic, atraumatic head. Mucous membranes moist.  Oropharynx unremarkable. There is no evidence of cervical lymphadenopathy. No evidence of meningismus.  CARDIOVASCULAR: Heart is regular, no murmurs, rubs, or gallops.  Good peripheral pulses. Cap refill less than 2 seconds.  PULMONARY/CHEST: Lungs are clear to auscultation bilaterally.  No signs of respiratory distress. No wheezing, rhonchi, or rales noted bilaterally.  ABDOMINAL: Soft, nontender, nondistended. No guarding or rebound, No obvious masses or organomegaly. Normal bowel sounds in all 4 quadrants.   MUSCULOSKELETAL: No deformities. No cyanosis. No pedal edema. No flank tenderness noted bilaterally.  No midline spinal tenderness noted.  NEURO: The patient is AAOx3.  There are no focal neurologic deficits.    SKIN: Warm, well perfused.  No acute rashes.  PSYCH:  Normal  affect. Normal speech. No hallucinations    ED STUDIES:     Labs Reviewed   CBC AND DIFFERENTIAL - Abnormal; Notable for the following components:       Result Value    Hgb 11.6 (*)     Hematocrit 35.6 (*)     Platelets 85 (*)     RBC 3.42 (*)     MCV 104.1 (*)     MCH 33.9 (*)     All other components within normal limits   I[facility] D-DIMER - Abnormal; Notable for the following components:    D-Dimer 2.05 (*)     All other components within normal limits   COMPREHENSIVE METABOLIC PANEL - Abnormal; Notable for the following components:    Glucose 119 (*)     Sodium 147 (*)     Chloride 114 (*)     Albumin 3.2 (*)     All other components within normal limits   PROBNP - Abnormal; Notable for the following components:    NT-proBNP 3,112 (*)     All other components within normal limits   URINALYSIS REFLEX TO MICROSCOPIC EXAM - REFLEX TO CULTURE - Abnormal; Notable for the following components:    Protein, UR 30 (*)     Bilirubin, UA Small (*)     RBC, UA 11-25 (*)     WBC, UA 11-25 (*)     All other components within normal limits   CELL MORPHOLOGY - Abnormal; Notable for the following components:    Platelet Clumps Present (*)     All other components within normal limits   CBC - Abnormal; Notable for the following components:    Hgb 12.0 (*)     Hematocrit 36.9 (*)     Platelets 84 (*)     RBC 3.54 (*)     MCV 104.2 (*)     MCH 33.9 (*)     All other components within normal limits    Narrative:     Obtain baseline prior to heparin initiation if not drawn  previously. Do not wait for result prior to heparin  initiation.  If heparin infusion is on MAR hold do not draw this lab.   COVID-19 (SARS-COV-2) & INFLUENZA  A/B, NAA (ROCHE LIAT)    Narrative:     o Collect and clearly label specimen type:  o PREFERRED-Upper  respiratory specimen: One Nasal Swab in  Transport Media.  o Hand deliver to laboratory ASAP  Diagnostic -PUI   URINE CULTURE   HIGH SENSITIVITY TROPONIN-I   GFR   APTT    Narrative:     Obtain baseline prior to  heparin initiation if not drawn  previously. Do not wait for result prior to heparin  initiation.  If heparin infusion is on MAR hold do not draw this lab.   ANTI-XA,UFH    Narrative:     Obtain baseline prior to heparin initiation if not drawn  previously. Do not wait for result prior to heparin  initiation.  If heparin infusion is on MAR hold do not draw this lab.  Centrifuge and Freeze within 1 hour of collection.   ANTI-XA,UFH       CT Angio Chest (PE study)   Final Result         1. Segmental and subsegmental pulmonary emboli in the right lower lobe.   Mild straightening of the interventricular septum as well as reflux of   contrast into the intrahepatic IVC, suggesting right heart strain.      These results were discussed with Dr. Brett Canales Nandi Tonnesen on 04/08/2021 at   5:57 PM on Epic secure messaging.      Aldean Ast, MD    04/08/2021 5:58 PM      XR Chest 2 Views   Final Result    No acute pulmonary infiltrates are demonstrated.      Merri Ray, MD    04/08/2021 2:36 PM          ED COURSE:    Vitals:    04/08/21 1334 04/08/21 1512 04/08/21 1806   BP: 134/87 160/77 145/79   Pulse: 76 (!) 57 66   Resp: 16     Temp: 98.5 F (36.9 C)     TempSrc: Oral     SpO2: 97% 100% 99%   Weight: 67.6 kg     Height: 5\' 6"  (1.676 m)         Medications   heparin 25,000 units in dextrose 5% 500 mL infusion (premix) (18 Units/kg/hr  67.6 kg Intravenous New Bag 04/08/21 1829)   albuterol (PROVENTIL) (2.5 MG/3ML) 0.083% nebulizer solution 2.5 mg (2.5 mg Nebulization Given 04/08/21 1428)   ipratropium (ATROVENT) 0.02 % nebulizer solution 0.5 mg (0.5 mg Nebulization Given 04/08/21 1428)   methylPREDNISolone sodium succinate (Solu-MEDROL) injection 125 mg (125 mg Intravenous Given 04/08/21 1426)   iohexol (OMNIPAQUE) 350 MG/ML injection 100 mL (100 mLs Intravenous Imaging Agent Given 04/08/21 1727)            PROVIDER NOTES / MDM:    I am the first provider for this patient.    I reviewed the vital signs, available nursing notes, past medical  history, past surgical history, family history, social history, old medical records, outside records.    Vital Signs: Reviewed the patient's vital signs during ED stay.    I reviewed pt's pulse oxymetry and cardiac monitor values, as relevant to the case.    Personal Protective Equipment (PPE)  Gloves, N95 and surgical mask.    EKG Interpretation: Sinus rhythm with PVCs, rib intervention block, bifascicular block, no significant ST abnormalities.  No significant changes when compared to prior EKG on 11/15/2020.  Interpreted by me    Imaging: CXR: No acute cardiopulmonary process.  Interpreted by me    Cardiac Monitor Interpretation:   Rhythm:  Normal Sinus, Rate:  Normal, Ectopy:  None: interpreted by me    CRITICAL CARE: The high probability of sudden, clinically significant deterioration in the patient's condition required the highest level of my preparedness to intervene urgently.    The services I provided to this patient were to treat and/or prevent clinically significant deterioration that could result in: worsening of condition or death.  Services included the following: chart data review, reviewing nursing notes and/or old charts, documentation time, consultant collaboration regarding findings and treatment options, medication orders and management, direct patient care, re-evaluations, vital sign assessments and ordering, interpreting and reviewing diagnostic studies/lab tests.    Aggregate critical care time was 35 minutes, which includes only time during which I was engaged in work directly related to the patient's care, as described above, whether at the bedside or elsewhere in the Emergency Department.  It did not include time spent performing other reported procedures or the services of residents, students, nurses or physician assistants.    For Hospitalized Patients:     1. Core Measures:     - 12-lead EKG was performed in the ED. Aspirin: Aspirin was not given because the patient did not present with a  stroke at the time of their Emergency Department evaluation..    88 y.o. male who presents to ED for evaluation of cough, congestion, shortness of breath.  On initial evaluation, the patient was noted to be non toxic appearing and in no acute distress.  Initial vitals were noted to be stable.  Clinically well-appearing 88 year old gentleman with recent cough, congestion.  Patient having intermittent episodes of hypoxia with bradycardia.  D-dimer positive BNP elevated 3000.  Troponin negative.  EKG shows new findings.  CT angio shows findings of segmental and subsegmental PEs with reflux concerning for submassive PE.  IR consulted.  Will evaluate patient in the morning.  Patient heparinized.  Patient remained hemodynamically stable.  Admitted to [facility] for further assessment.       I spoke with the inpatient team who agreed to accept this patient under their care for further management. At the time of the patient's disposition, the patient was hemodynamically stable and all questions had been answered.     DIAGNOSTIC IMPRESSION:  1. Acute pulmonary embolism with acute cor pulmonale, unspecified pulmonary embolism type        DISPOSITION: Admit    Medical Decision Making  Amount and/or Complexity of Data Reviewed  Labs: ordered.  Radiology: ordered.  ECG/medicine tests: ordered.    Risk  Prescription drug management.  Decision regarding hospitalization.      **Please note, this document was generated using voice recognition software which was designed to generate medical notes and help improve efficiency in patient care.  Unfortunately, the software does generate grammatical errors and typos.  While this note was reviewed and edited where appropriate, its possible that some of these errors may still be present.       ReddenBrett Canales, MD  04/08/21 [phone]

## 2021-04-08 NOTE — ED Notes (Signed)
Pt. Presents to ED with cough, congestion and hemoptysis x1week. Pt. Was in route to be transferred to [facility] via EMS, however son insisted that he goes POV and was discharged on POV. Pt was on Heparin drip before discharge for PE on right lung, however son insisted that he drive his father over to [facility] and not wait for transport and therefore heparin drip was discontinued. Pt. Has an anticoag wrist band and both pt. And family are aware of bleeding risk and have signed all required documentation before discharge.

## 2021-04-08 NOTE — ED Notes (Signed)
Abdullah Chevy Sweigert has elected to use a private vehicle for transport after discussing the risks and benefits with the provider. The Transfer POV Agreement form has been signed.     The best contact information for the patient during transport is:     Name: Rick [name]  Phone Number: [phone]

## 2021-04-08 NOTE — ED Notes (Signed)
Pt. And son have confirmed pt. Has not received anticoagulant in the last 12 hours.

## 2021-04-09 ENCOUNTER — Inpatient Hospital Stay: Payer: Medicare Other

## 2021-04-09 ENCOUNTER — Inpatient Hospital Stay (HOSPITAL_COMMUNITY): Payer: Medicare Other

## 2021-04-09 ENCOUNTER — Encounter: Payer: Self-pay | Admitting: Internal Medicine

## 2021-04-09 DIAGNOSIS — I371 Nonrheumatic pulmonary valve insufficiency: Secondary | ICD-10-CM

## 2021-04-09 DIAGNOSIS — I5021 Acute systolic (congestive) heart failure: Secondary | ICD-10-CM

## 2021-04-09 DIAGNOSIS — I110 Hypertensive heart disease with heart failure: Secondary | ICD-10-CM

## 2021-04-09 DIAGNOSIS — I5020 Unspecified systolic (congestive) heart failure: Secondary | ICD-10-CM

## 2021-04-09 DIAGNOSIS — I2609 Other pulmonary embolism with acute cor pulmonale: Secondary | ICD-10-CM | POA: Diagnosis present

## 2021-04-09 DIAGNOSIS — I361 Nonrheumatic tricuspid (valve) insufficiency: Secondary | ICD-10-CM

## 2021-04-09 LAB — ECHOCARDIOGRAM ADULT COMPLETE W CLR/ DOPP WAVEFORM
AV Area (Cont Eq VTI): 0.647
AV Area (Cont Eq VTI): 0.697
AV Area (Cont Eq VTI): 0.761
AV Area (Cont Eq VTI): 0.814
AV Area (Cont Eq VTI): 0.856
AV Area (Cont Eq VTI): 0.951
AV Mean Gradient: 11.77
AV Mean Gradient: 14.216
AV Mean Gradient: 8.899
AV Mean Gradient: 9.417
AV Mean Gradient: 9.927
AV Peak Velocity: 194.56
AV Peak Velocity: 195.197
AV Peak Velocity: 195.449
AV Peak Velocity: 196.092
AV Peak Velocity: 201.465
AV Peak Velocity: 204.152
AV Peak Velocity: 220.326
AV Peak Velocity: 220.326
AV Peak Velocity: 246.981
AV Peak Velocity: 246.981
Ao Root Diameter (2D): 2.998
IVS Diastolic Thickness (2D): 0.972
LA Dimension (2D): 5.973
LA Dimension (2D): 5.998
LA Dimension (2D): 6.109
LA Volume Index (BP A-L): 0.07
LVID diastole (2D): 6.432
LVID systole (2D): 5.424
MV Area (PHT): 1.981
MV E/A: 1.527
MV E/A: 1.527
MV E/A: 1.681
MV E/A: 1.72
MV E/e' (Average): 8.874
Prox Ascending Aorta Diameter: 4.038
RV Basal Diastolic Dimension: 3.874
RV Function: DECREASED
RV Systolic Pressure: 45.797
RV Systolic Pressure: 46.01
RV Systolic Pressure: 47.007
RV Systolic Pressure: 47.557
RV Systolic Pressure: 48.336
RV Systolic Pressure: 51.078
RV Systolic Pressure: 51.727
RV Systolic Pressure: 54.378
RV Systolic Pressure: 54.378
Site RV Size (AS): NORMAL
TAPSE: 1.378
TAPSE: 1.378

## 2021-04-09 LAB — ECG 12-LEAD
Atrial Rate: 62 {beats}/min
P Axis: 77 degrees
P-R Interval: 176 ms
Q-T Interval: 486 ms
QRS Duration: 142 ms
QTC Calculation (Bezet): 493 ms
R Axis: -60 degrees
T Axis: -14 degrees
Ventricular Rate: 62 {beats}/min

## 2021-04-09 LAB — BASIC METABOLIC PANEL
Anion Gap: 7 (ref 5.0–15.0)
BUN: 28 mg/dL (ref 9.0–28.0)
CO2: 30 mEq/L — ABNORMAL HIGH (ref 17–29)
Calcium: 9.4 mg/dL (ref 7.9–10.2)
Chloride: 107 mEq/L (ref 99–111)
Creatinine: 1.6 mg/dL — ABNORMAL HIGH (ref 0.5–1.5)
Glucose: 95 mg/dL (ref 70–100)
Potassium: 4.3 mEq/L (ref 3.5–5.3)
Sodium: 144 mEq/L (ref 135–145)

## 2021-04-09 LAB — LACTIC ACID, PLASMA
Lactic Acid: 2.3 mmol/L — ABNORMAL HIGH (ref 0.2–2.0)
Lactic Acid: 2.2 mmol/L — ABNORMAL HIGH (ref 0.2–2.0)
Lactic Acid: 3.1 mmol/L — ABNORMAL HIGH (ref 0.2–2.0)
Lactic Acid: 1.9 mmol/L (ref 0.2–2.0)

## 2021-04-09 LAB — CBC
Absolute NRBC: 0 10*3/uL (ref 0.00–0.00)
Hematocrit: 38.5 % (ref 37.6–49.6)
Hgb: 12.3 g/dL — ABNORMAL LOW (ref 12.5–17.1)
MCH: 33.9 pg — ABNORMAL HIGH (ref 25.1–33.5)
MCHC: 31.9 g/dL (ref 31.5–35.8)
MCV: 106.1 fL — ABNORMAL HIGH (ref 78.0–96.0)
MPV: 11.7 fL (ref 8.9–12.5)
Nucleated RBC: 0 /100 WBC (ref 0.0–0.0)
Platelets: 93 10*3/uL — ABNORMAL LOW (ref 142–346)
RBC: 3.63 10*6/uL — ABNORMAL LOW (ref 4.20–5.90)
RDW: 15 % (ref 11–15)
WBC: 7.61 10*3/uL (ref 3.10–9.50)

## 2021-04-09 LAB — LACTIC ACID: Lactic Acid: 1.6 mmol/L (ref 0.2–2.0)

## 2021-04-09 LAB — PROBNP
NT-proBNP: 4806 pg/mL — ABNORMAL HIGH (ref 0–450)
NT-proBNP: 5311 pg/mL — ABNORMAL HIGH (ref 0–450)

## 2021-04-09 LAB — ANTI-XA,UFH
Anti-Xa, UFH: 0.34 IU/mL
Anti-Xa, UFH: 0.64 IU/mL
Anti-Xa, UFH: 0.08 IU/mL

## 2021-04-09 LAB — HIGH SENSITIVITY TROPONIN-I: hs Troponin-I: 26.2 ng/L

## 2021-04-09 LAB — GFR: EGFR: 48.9

## 2021-04-09 MED ORDER — MEMANTINE HCL 10 MG PO TABS
20.0000 mg | ORAL_TABLET | Freq: Every evening | ORAL | Status: DC
Start: 2021-04-09 — End: 2021-04-09

## 2021-04-09 MED ORDER — APIXABAN 5 MG PO TABS
10.0000 mg | ORAL_TABLET | Freq: Two times a day (BID) | ORAL | Status: DC
Start: 2021-04-09 — End: 2021-04-11
  Administered 2021-04-09 – 2021-04-11 (×4): 10 mg via ORAL
  Filled 2021-04-09 (×4): qty 2

## 2021-04-09 MED ORDER — HEPARIN (PORCINE) IN D5W 50-5 UNIT/ML-% IV SOLN (UNITS/KG/HR ONLY)
18.0000 [IU]/kg/h | INTRAVENOUS | Status: DC
Start: 2021-04-09 — End: 2021-04-09
  Administered 2021-04-09: 18 [IU]/kg/h via INTRAVENOUS
  Filled 2021-04-09: qty 500

## 2021-04-09 MED ORDER — FUROSEMIDE 10 MG/ML IJ SOLN
40.0000 mg | Freq: Two times a day (BID) | INTRAMUSCULAR | Status: DC
Start: 2021-04-09 — End: 2021-04-10
  Administered 2021-04-09 – 2021-04-10 (×3): 40 mg via INTRAVENOUS
  Filled 2021-04-09 (×3): qty 4

## 2021-04-09 MED ORDER — SACUBITRIL-VALSARTAN 24-26 MG PO TABS
1.0000 | ORAL_TABLET | Freq: Two times a day (BID) | ORAL | Status: DC
Start: 2021-04-09 — End: 2021-04-09
  Administered 2021-04-09: 1 via ORAL
  Filled 2021-04-09: qty 1

## 2021-04-09 MED ORDER — DEXTROSE 10 % IV BOLUS
12.5000 g | INTRAVENOUS | Status: DC | PRN
Start: 2021-04-09 — End: 2021-04-11

## 2021-04-09 MED ORDER — SACUBITRIL-VALSARTAN 24-26 MG PO TABS
0.5000 | ORAL_TABLET | Freq: Two times a day (BID) | ORAL | Status: DC
Start: 2021-04-09 — End: 2021-04-11
  Administered 2021-04-09 – 2021-04-11 (×4): 0.5 via ORAL
  Filled 2021-04-09 (×4): qty 1

## 2021-04-09 MED ORDER — METOPROLOL TARTRATE 25 MG PO TABS
12.5000 mg | ORAL_TABLET | Freq: Two times a day (BID) | ORAL | Status: DC
Start: 2021-04-09 — End: 2021-04-10
  Administered 2021-04-09: 12.5 mg via ORAL
  Filled 2021-04-09 (×2): qty 1

## 2021-04-09 MED ORDER — ACETAMINOPHEN 325 MG PO TABS
650.0000 mg | ORAL_TABLET | ORAL | Status: DC | PRN
Start: 2021-04-09 — End: 2021-04-11

## 2021-04-09 MED ORDER — DOCUSATE SODIUM 100 MG PO CAPS
100.0000 mg | ORAL_CAPSULE | Freq: Two times a day (BID) | ORAL | Status: DC
Start: 2021-04-09 — End: 2021-04-11
  Administered 2021-04-09 – 2021-04-10 (×3): 100 mg via ORAL
  Filled 2021-04-09 (×4): qty 1

## 2021-04-09 MED ORDER — ROSUVASTATIN CALCIUM 10 MG PO TABS
10.0000 mg | ORAL_TABLET | Freq: Every evening | ORAL | Status: DC
Start: 2021-04-09 — End: 2021-04-11
  Administered 2021-04-09 – 2021-04-10 (×2): 10 mg via ORAL
  Filled 2021-04-09 (×2): qty 1

## 2021-04-09 MED ORDER — GLUCOSE 40 % PO GEL (WRAP)
15.0000 g | ORAL | Status: DC | PRN
Start: 2021-04-09 — End: 2021-04-11

## 2021-04-09 MED ORDER — APIXABAN 5 MG PO TABS
5.0000 mg | ORAL_TABLET | Freq: Two times a day (BID) | ORAL | Status: DC
Start: 2021-04-16 — End: 2021-04-11

## 2021-04-09 MED ORDER — DEXTROSE 50 % IV SOLN
12.5000 g | INTRAVENOUS | Status: DC | PRN
Start: 2021-04-09 — End: 2021-04-11

## 2021-04-09 MED ORDER — GLUCAGON 1 MG IJ SOLR (WRAP)
1.0000 mg | INTRAMUSCULAR | Status: DC | PRN
Start: 2021-04-09 — End: 2021-04-11

## 2021-04-09 MED ORDER — NALOXONE HCL 0.4 MG/ML IJ SOLN (WRAP)
0.2000 mg | INTRAMUSCULAR | Status: DC | PRN
Start: 2021-04-09 — End: 2021-04-11

## 2021-04-09 MED ORDER — MEMANTINE HCL 10 MG PO TABS
10.0000 mg | ORAL_TABLET | Freq: Two times a day (BID) | ORAL | Status: DC
Start: 2021-04-09 — End: 2021-04-11
  Administered 2021-04-09 – 2021-04-11 (×6): 10 mg via ORAL
  Filled 2021-04-09 (×6): qty 1

## 2021-04-09 MED ORDER — MELATONIN 3 MG PO TABS
3.0000 mg | ORAL_TABLET | Freq: Every evening | ORAL | Status: DC | PRN
Start: 2021-04-09 — End: 2021-04-10
  Administered 2021-04-09: 3 mg via ORAL
  Filled 2021-04-09: qty 1

## 2021-04-09 NOTE — ED Notes (Signed)
[facility]  ED NURSING NOTE FOR THE RECEIVING INPATIENT NURSE   ED NURSE [place] [phone]   ED CHARGE RN Alcario Drought   ADMISSION INFORMATION   Rick [name] is a 88 y.o. male admitted with an ED diagnosis of:    1. Acute pulmonary embolism with acute cor pulmonale, unspecified pulmonary embolism type         Isolation: None   Allergies: Patient has no known allergies.   Holding Orders confirmed? No   Belongings Documented? Yes   Home medications sent to pharmacy confirmed? Yes   NURSING CARE   Patient Comes From:   Mental Status: Home/Family Care  alert   ADL: Needs assistance with ADLs   Ambulation: 1 person assist   Pertinent Information  and Safety Concerns:     Broset Violence Risk Level: Low Pt. Presents to ED with cough, hemoptysis and congestion x1week. Elevated Dimer 2.05ug/ml, [id number] pg/ml     CT / NIH   CT Head ordered on this patient?  No   NIH/Dysphagia assessment done prior to admission? N/A   VITAL SIGNS (at the time of this note)      Vitals:    04/08/21 2246   BP: 135/73   Pulse: 66   Resp:    Temp:    SpO2: 98%

## 2021-04-09 NOTE — Consults (Signed)
[facility] CONS[facility]   Date:  04/09/2021  2:08 PM    Primary Cardiologist: Betti Cruz, MD  Case Discussed with [facility] Attending:  Richarda Overlie, MD    Provider Completing Note:  Jinny Blossom, PA-C  [facility])  [facility] [phone] (7am-7pm)  OFFICE Number [phone] (7pm-7am)  MD LINE [phone]    Patient:  Rick [name] [facility].  DOB: 1924-07-31.  male  Date of Admission:  04/08/2021    Attending:  Majel Homer, MD     I have personally performed a face-to-face diagnostic evaluation on Rick [name] and discussed the plan with the patient. I have reviewed and agree with the care plan as outlined by the APP. I have also personally reviewed the patient's data, labs and other studies of relevance.    88 yo male with hx of CABG in 2003, severe AS s/p TAVR in 2021, non-ischemic cardiomyopathy LVEF 30% who presents with acute pulmonary embolism and acute decompensated systolic heart failure. He appears very well clinically relative to his co-morbid conditions. TTE today showed worse tricuspid regurgitation in comparison to prior echo as well as moderate pulmonary hypertension. Additionally, he has very frequent PVC's that lead to worsening of his systolic function, though overall his LVEF is unchanged with his normal contractions. Will continue IV diuresis and IV heparin for PE. He has hx of bradycardia with beta blockade, but given the significant PVC burden with his underlying cardiomyopathy, will re-challenge him with low dose metoprolol.    Constitutional:  appears well, comfortable  Head: normocephalic, atraumatic  Pulmonary:  unlabored respiration, clear to auscultation bilaterally with no wheeze or crackles  Eyes: EOMI, anicteric sclera  Neck: supple, Jugular venous pulsations not seen  Cardiovascular:  normal rate, regular rhythm, normal S1S2, no murmurs  Skin:  warm and dry  Neuro/Psychiatic:  alert, oriented, no focal deficits  Extremities: warm, 2-3+  pitting peripheral edema, intact pulsations, no cyanosis          Reason For Consultation:   CHF, PE    ASSESSMENT/PLAN:   88 y.o. male with a history of CAD status post CABG 2003 while living in [place], most recent [facility] 06/2019 with patent LIMA to LAD, patent SVG to OM1, patent SVG to OM2, patent SVG to RCA, status post TAVR 06/2019, mixed ischemic/nonischemic cardiomyopathy with EF 30%, chronic HFrEF, hypertension, dyslipidemia who presented to the hospital on 04/08/2021 with shortness of breath.    #Acute pulmonary embolism-on IV heparin  -Recommending transition to Eliquis as soon as appropriate, to avoid excessive volume overload with IV heparin drip    #Acute on chronic HFrEF-at baseline his activity is fairly limited by balance, uses a cane, is not chronically on a diuretic at home.  Does not typically complain of shortness of breath with his normal light activity.  Presented with some degree of volume overload.  proBNP elevated.  Was started on IV Lasix dosing on admission.  Still with an element of volume overload but not markedly decompensated.  -Volume management: Continue Lasix 40 mg IV twice daily and reassess volume status tomorrow  -GDMT: Not chronically on a beta-blocker due to history of bradycardia.  We will start low-dose beta-blocker due to PVCs as below (will monitor for bradycardia).  Continue Entresto but will change to his normal home dose of half a tablet twice daily due to history of symptomatic hypotension.  -Strict I's and O's, standing daily weights, fluid and sodium restriction    #Mixed ischemic/nonischemic cardiomyopathy-history of ejection fraction  30% in 2021, felt to be mixed etiology.  Echocardiogram this admission ejection fraction 20-30%.  -GDMT as above    #CAD-history of CABG in [place] in 2003.  Prior to TAVR in 2021 left heart catheterization showed patent bypass grafts as detailed.  He is having no anginal symptoms.  High sensitive troponin is normal.  No  ischemic changes on EKG.   -Continue Crestor  -Not on aspirin.  Will be on full anticoagulation for his pulm embolism    #Aortic stenosis, status post TAVR-echocardiogram this admission with normally functioning TAVR.    #Ventricular ectopy-frequent PVCs on telemetry, up to approximately 20/minute.  Some brief runs of NSVT without symptoms.  In the setting of his cardiomyopathy would like to minimize PVC burden.  We will therefore start low-dose beta-blocker, Lopressor 12.5 mg twice daily.  We will plan to switch to Toprol XL on discharge.  History of reported bradycardia, will monitor heart rates on tele.  Currently not bradycardic.  We will add magnesium to labs.  -Patient patient is a full code, potentially discuss candidacy for primary prevention ICD with family, given his persistent cardiomyopathy despite maximally tolerated GDMT, but that would be an outpatient consideration.       HISTORY OF PRESENT ILLNESS:   Rick [name] is a 88 y.o. male with a history of CAD status post CABG 2003 while living in [place], most recent [facility] 06/2019 with patent LIMA to LAD, patent SVG to OM1, patent SVG to OM2, patent SVG to RCA, status post TAVR 06/2019, mixed ischemic/nonischemic cardiomyopathy with EF 30%, chronic HFrEF, hypertension, dyslipidemia who presented to the hospital on 04/08/2021 with shortness of breath.    Patient is accompanied by his daughter right now, she assists with history.    Beginning last Thursday he was "wheezing" and appearing more short of breath than usual.  As baseline uses a walker or cane, walks very carefully from room to room but does not usually have problems with shortness of breath with his level of activity.  Over the past several days he had increasing shortness of breath with light activity, was audibly wheezing at times.    Yesterday he had some hemoptysis.  With his ongoing symptoms he was taken to [facility].    As above at baseline ambulates with a walker or  cane, does not usually have problems with shortness of breath with that level of activity.  No orthopnea or PND.  No chest pain.  No palpitations.  No near-syncope or syncope.  No issues with leg edema.  He is not on a chronic diuretic.    In the past has had issues with hypotension limiting his medication management to some extent.    Hospital course:    Presenting blood pressure 132/69.  Normal oxygen saturation on presentation.    CTA chest yesterday with segmental and subsegmental pulmonary emboli in the right lower lobe.    Started on IV heparin.    Bilateral lower extremity Doppler with no evidence of DVT right or left lower extremity.    Labs reviewed.  CBC notable for platelet count of 84,000, consistent with previous values.  Chemistry with BUN 26, creatinine 1.3.  Potassium 4.5.    High-sensitivity troponin 33.2 followed by 26.2.  proBNP on admission 3112.    Vital signs today with blood pressure 147/84.  Breathing on room air with oxygen saturation 97%.    Currently seated up in bed eating lunch.  Daughter at bedside.  Patient does  not have any active complaints.  States he is less short of breath today.  No chest pain.  No lightheadedness or palpitations.    ------------------------   Outpatient cardiovascular medications: Crestor 20 mg daily, Entresto 24-26 mg 1/2 tablet twice daily     Reported no current tobacco/alcohol/recreational drug use.    Reported no premature CAD in the family.    REVIEW OF SYSTEMS:                                              ARROS, ROSSSSS     All other systems were reviewed and were negative except as noted in HPI      CARDIAC DIAGNOSTICS:                                         [id number]     Telemetry:  (I independently reviewed the telemetry data) sinus rhythm with rates mostly 70s to 80s.  Some heart rates in the 60s.  Frequent isolated PVCs, sometimes up to approximately 20/minute.  There are some runs of NSVT ranging from ~4-8 beats.  There are some PACs with aberrancy  as well.    ECG 04/08/2021:  (I independently reviewed the ECG tracing) sinus rhythm, 62 bpm, RBBB, LAFB, occasional isolated PVC    ECG 11/15/2020: Sinus bradycardia, 51 bpm, RBBB, occasional PVCs    Chest Xray 04/08/2021: No acute pulmonary infiltrates demonstrated    TTE 04/09/21:  Summary    * The left ventricular cavity size is dilated.  Left ventricular wall  thickness is normal.  Left ventricular systolic function is severely decreased  with an estimated ejection fraction of 25-30%.    * The right ventricular cavity size is upper limits of normal in size.  Mildly decreased right ventricular systolic function.    * Severe bi-atrial dilation.    * There is a 29mm Sapien 3 TAVR valve in aortic position that is normally  functioning.    * There is severe tricuspid regurgitation.  Moderate pulmonary hypertension  with estimated right ventricular systolic pressure of 54 mmHg.    * There is moderate pulmonic valve regurgitation.    * There is mild to moderate mitral regurgitation.    * Elevated right atrial pressure.    * Compared to the prior study dated 08/25/2019 (personally reviewed),  tricuspid regurgitation increased. There are frequent PVC's throughout the  length of this study. Otherwise, no significant changes.    TTE 08/25/2019:  Summary    * The left ventricle is mildly dilated.    * Left ventricular systolic function is severely decreased with an ejection  fraction by Biplane Method of Discs of  30 %.    * Left ventricular wall thickness is mildly increased.    * LVOT VTI is low consistent with low stroke volume (58mL) and cardiac  output (3.3L/min).  Stroke volume index 26mL/m2.    * The right ventricular cavity size is mildly dilated.    * Decreased right ventricular systolic function.  RV FAC 11.2%, RV S' 6.6  cm/s.  TAPSE 16mm.    * There is a known 29mm Sapien 3 TAVR valve in aortic position. It appears  well seated. DVI 0.3 and EOA 1.3cm2. These values are derived from a  low  stroke volume (low LVOT VTI)  and may reflect an underestimation of valve area  due to low output state.    * There is mild mitral regurgitation.    * There is mild to moderate tricuspid regurgitation.    * Moderate pulmonary hypertension with estimated right ventricular systolic  pressure of  46 mmHg.    * The ascending aorta is dilated.    * Compared to the prior study, Jul 13 2019, TAVR hemodynamics have changed.  Change in DVI and EOA may be underestimated and due to low output state.       LHC 05/2019 (pre TAVR):  Severe three-vessel coronary artery disease.   Patent LIMA to the LAD, patent vein graft to obtuse marginal 1 and obtuse marginal 2 of the circumflex coronary artery, and patent vein graft to the right coronary artery.     PAST MEDICAL HISTORY:     Past Medical History:   Diagnosis Date    Atrial enlargement, bilateral Echo result 04/04/19    CAD (coronary artery disease) 05/28/19 Cath results.    Severe 3V CAD.  Patent LIMA to LAD, patent SVG to OM 1 and OM 2 of circumflex CA, & patent SVG to RCA.    Chronic combined systolic and diastolic heart failure 310/21 OV Dr. Penny Pia    EF=30%    Dementia 04/2014    Difficulty walking 06/2016    Post surgery of lt knee, rt femur    Dyslipidemia 310/21 OV Dr. Penny Pia    Dyspnea on exertion 02/2018    HTN (hypertension) 310/21 OV Dr. Penny Pia    Mild cognitive impairment 310/21 OV Dr. Penny Pia    Primary cardiomyopathy 11/2017    RBBB 07/10/19 EKg result    S/P CABG (coronary artery bypass graft)      LIMA to LAD,  SVG to OM 1 & OM 2 of circumflex CA, & SVG to RCA.     Past Surgical History:   Procedure Laterality Date    CORONARY ARTERY BYPASS GRAFT  2003     approx year: LIMA to LAD,  SVG to OM 1 & OM 2 of circumflex CA, & SVG to RCA    FRACTURE SURGERY Right 06/2016    femur    PERCUTANEOUS VALVULOPLASTY - AORTIC N/A 07/12/2019    Procedure: TAVR #2;  Surgeon: Arlyss Queen, MD PhD;  Location: [facility];  Service: Cardiovascular;  Laterality: N/A;  23 HR OB    REPLACEMENT, AORTIC  VALVE , EDWARDS SAPIEN N/A 07/12/2019    Procedure: REPLACEMENT, AORTIC VALVE , EDWARDS SAPIEN -- TAVR Valve:   S3 29mm -- Access: RTF -- IC: Dr. Penny Pia;  Surgeon: Christene Lye, MD;  Location: [facility];  Service: Cardiothoracic;  Laterality: N/A;    RIGHT & LEFT HEART CATH POSSIBLE PCI N/A 05/26/2019    Procedure: RIGHT & LEFT HEART CATH POSSIBLE PCI;  Surgeon: Arlyss Queen, MD PhD;  Location: [facility];  Service: Cardiovascular;  Laterality: N/A;  [id number] scheduled with Maira in dr's office. OP. kr    TAVR PERCUTANEOUS FEMORAL  07/13/2019         TOTAL KNEE ARTHROPLASTY Left 2017    TOTAL KNEE ARTHROPLASTY Right 2005       Allergies:   No Known Allergies    MEDICATIONS:    INFUSION MEDS:      heparin infusion 25,000 units/500 mL (VTE/Moderate Intensity) 18 Units/kg/hr (04/09/21 0123)      SCHEDULED MEDS:  Current Facility-Administered Medications   Medication Dose Route Frequency    docusate sodium  100 mg Oral BID    furosemide  40 mg Intravenous BID    memantine  10 mg Oral BID    rosuvastatin  10 mg Oral QHS    sacubitril-valsartan  1 tablet Oral BID       SOCIAL HISTORY:     Social History     Tobacco Use    Smoking status: Never    Smokeless tobacco: Never   Substance Use Topics    Alcohol use: Never       FAMILY HISTORY:   family history includes No known problems in his brother, father, mother, and sister.      PHYSICAL EXAM:                                                        ARPE3, ARPE2, PEEEEE   BP 147/84   Pulse (!) 58   Temp 98.1 F (36.7 C) (Oral)   Resp 20   Ht 1.676 m (5\' 6" )   Wt 75.9 kg (167 lb 5.3 oz)   SpO2 97%   BMI 27.01 kg/m  No intake or output data in the 24 hours ending 04/09/21 1408    General Appearance:  Breathing comfortable, no acute distress  Head:  normocephalic  Eyes:  EOM's intact, nonicteric sclera  Neck: Mild JVD  Lungs:  Clear to auscultation throughout, no wheezes, rhonchi or rales, good respiratory effort, symmetric chest  expansion  Cardiac:  RRR, normal S1, S2, no murmur, no rub, S3 or S4  Abdomen:  Soft, non-tender, no rebound or guarding, non-distended, positive bowel sounds  Extremities:  No cyanosis or clubbing. No arthritis or deformities.  Trace lower leg edema  Vascular: 2+ radial and distal pulses bilaterally  Neurologic:  Alert and oriented x3, mood and affect normal    LABORATORY:     CBC w/Diff   Recent Labs   Lab 04/08/21  1820 04/08/21  1433   WBC 3.16 3.44   Hgb 12.0* 11.6*   Hematocrit 36.9* 35.6*   Platelets 84* 85*        Basic Metabolic Profile   Recent Labs   Lab 04/08/21  1433   Sodium 147*   Potassium 4.5   Chloride 114*   CO2 24   BUN 26.0   Creatinine 1.3   EGFR >60.0   Glucose 119*   Calcium 9.2        Cardiac Enzymes   Recent Labs   Lab 04/09/21  0110 04/08/21  1433   hs Troponin-I 26.2 33.2     Recent Labs   Lab 04/09/21  1015 04/09/21  0110 04/08/21  1433   NT-proBNP 5,311* 4,806* 3,112*        D-dimer   Recent Labs   Lab 04/08/21  1433   D-Dimer 2.05*        Thyroid Studies         Invalid input(s): FREET4     Cholesterol Panel          Coagulation Studies   Recent Labs   Lab 04/08/21  1820   PTT 36

## 2021-04-09 NOTE — H&P (Signed)
[facility]      Patient: Rick [name]  Date: 04/08/2021   DOB: [birth year]  Admission Date: 04/08/2021   MRN: [id number]  Attending: Estrella Deeds, NP         Active Hospital Problems    Diagnosis    Pulmonary embolism with acute cor pulmonale, unspecified chronicity, unspecified pulmonary embolism type        History Gathered From: The patient, ED provider's notes     Chief complaint: Cough, congestion, shortness of breath       HISTORY AND PHYSICAL       Rick [name] is a 88 y.o. male with a PMH including and significant for CAD (s/p CABG), cardiomyopathy, RBBB, combined systolic and diastolic heart failure, dementia, dyslipidemia, hypertension and cognitive impairment who was transferred from the [facility] for management of segmental and subsegmental pulmonary emboli in the RLL with changes on CTA suggesting right heart strain.  Patient initially presented to the ED complaining of cough, congestion and wheezing since 2/16.  He did not endorse any particular exacerbating or alleviating factors.  During my interview/evaluation, he reported continued shortness of breath even while at rest, but denied chest pain, palpitations and dizziness.    Initial BNP 3,112;  D-dimer 2.05.  EKG: Sinus rhythm with PVCs RBBB, no ischemic changes; no significant changes when compared to EKG September 2022.     Past Medical History:   Diagnosis Date    Atrial enlargement, bilateral Echo result 04/04/19    CAD (coronary artery disease) 05/28/19 Cath results.    Severe 3V CAD.  Patent LIMA to LAD, patent SVG to OM 1 and OM 2 of circumflex CA, & patent SVG to RCA.    Chronic combined systolic and diastolic heart failure 310/21 OV Dr. Penny Pia    EF=30%    Dementia 04/2014    Difficulty walking 06/2016    Post surgery of lt knee, rt femur    Dyslipidemia 310/21 OV Dr. Penny Pia    Dyspnea on exertion 02/2018    HTN (hypertension) 310/21 OV Dr. Penny Pia    Mild cognitive impairment 310/21 OV Dr. Penny Pia     Primary cardiomyopathy 11/2017    RBBB 07/10/19 EKg result    S/P CABG (coronary artery bypass graft)      LIMA to LAD,  SVG to OM 1 & OM 2 of circumflex CA, & SVG to RCA.       Past Surgical History:   Procedure Laterality Date    CORONARY ARTERY BYPASS GRAFT  2003     approx year: LIMA to LAD,  SVG to OM 1 & OM 2 of circumflex CA, & SVG to RCA    FRACTURE SURGERY Right 06/2016    femur    PERCUTANEOUS VALVULOPLASTY - AORTIC N/A 07/12/2019    Procedure: TAVR #2;  Surgeon: Arlyss Queen, MD PhD;  Location: [facility];  Service: Cardiovascular;  Laterality: N/A;  23 HR OB    REPLACEMENT, AORTIC VALVE , EDWARDS SAPIEN N/A 07/12/2019    Procedure: REPLACEMENT, AORTIC VALVE , EDWARDS SAPIEN -- TAVR Valve:   S3 29mm -- Access: RTF -- IC: Dr. Penny Pia;  Surgeon: Christene Lye, MD;  Location: [facility];  Service: Cardiothoracic;  Laterality: N/A;    RIGHT & LEFT HEART CATH POSSIBLE PCI N/A 05/26/2019    Procedure: RIGHT & LEFT HEART CATH POSSIBLE PCI;  Surgeon: Arlyss Queen, MD PhD;  Location: [facility];  Service: Cardiovascular;  Laterality: N/A;  [id number] scheduled with Victorino Dike in dr's office. OP. kr    TAVR PERCUTANEOUS FEMORAL  07/13/2019         TOTAL KNEE ARTHROPLASTY Left 2017    TOTAL KNEE ARTHROPLASTY Right 2005       Prior to Admission medications    Medication Sig Start Date End Date Taking? Authorizing Provider   acetaminophen (TYLENOL) 325 MG tablet Take 2 tablets (650 mg total) by mouth every 4 (four) hours as needed for Pain 07/13/19  Yes Bowman, Cristin A, NP   docusate sodium (COLACE) 100 MG capsule Take 1 capsule (100 mg) by mouth 2 (two) times daily   Yes [provider]   memantine (NAMENDA) 10 MG tablet Take 2 tablets (20 mg total) by mouth every evening 10/17/19  Yes Erasmo Score, MD   Multiple Vitamin (multivitamin) capsule Take 1 capsule by mouth daily   Yes [provider]   rosuvastatin (CRESTOR) 20 MG tablet  11/28/19   [provider]    sacubitril-valsartan (ENTRESTO) 24-26 MG Tab per tablet Take 1 tablet by mouth 2 (two) times daily  Patient taking differently: Take 1 tablet by mouth 2 (two) times daily 1/2 10/23/20   Bari Edward, MD   SUPPLEMENTS NOT FOUND IN DATABASE Take 1 tablet by mouth 2 (two) times daily Stop 3 days prior to TAVR   Super Beta plus    [provider]   vitamin D (CHOLECALCIFEROL) 25 MCG (1000 UT) tablet Take 2 tablets (2,000 Units) by mouth Once every Monday, Wednesday and Friday morning    [provider]       No Known Allergies    CODE STATUS: Full    PRIMARY CARE MD: Erasmo Score, MD    Family History   Problem Relation Age of Onset    No known problems Mother     No known problems Father     No known problems Sister     No known problems Brother        Social History     Tobacco Use    Smoking status: Never    Smokeless tobacco: Never   Vaping Use    Vaping Use: Never used   Substance Use Topics    Alcohol use: Never    Drug use: Never       REVIEW OF SYSTEMS     Review of Systems   Constitutional: Negative.    HENT: Negative.     Eyes: Negative.    Respiratory:  Positive for shortness of breath. Negative for cough and wheezing.    Cardiovascular:  Negative for chest pain, palpitations, orthopnea, claudication, leg swelling and PND.   Gastrointestinal: Negative.    Genitourinary: Negative.    Musculoskeletal: Negative.    Skin: Negative.    Neurological: Negative.    Endo/Heme/Allergies: Negative.    Psychiatric/Behavioral: Negative.       LMP: No LMP for male patient.     PHYSICAL EXAM       Vital Signs (most recent): BP 132/69   Pulse 71   Temp 97.9 F (36.6 C) (Oral)   Resp 18   Wt 67.6 kg (149 lb)   SpO2 98%   BMI 24.05 kg/m     Constitutional: Well developed, well nourished, in no acute distress.    HEENT: Head normocephalic, atraumatic.  Pupils equal and reactive to light.  No scleral icterus or conjunctival pallor, no nasal discharge, MMM, oropharynx without erythema or exudate   Neck:  trachea  midline, supple, no cervical or supraclavicular lymphadenopathy or masses   Cardiovascular: RRR, normal S1 S2, no murmurs, gallops, palpable thrills, no JVD, Non-displaced PMI.   Respiratory: Normal rate. No retractions or increased work of breathing. Clear to auscultation and percussion bilaterally.   Gastrointestinal: +BS, non-distended, soft, non-tender, no rebound or guarding, no hepatosplenomegaly   Genitourinary: no suprapubic or costovertebral angle tenderness   Musculoskeletal: ROM and motor strength grossly normal. No clubbing, edema, or cyanosis. DP and radial pulses 2+ and symmetric.      Skin exam:  pink   Neurologic: EOMI, CN 2-12 grossly intact. no gross motor or sensory deficits   Psychiatric: Alert and oriented to person and place.  History of dementia.  The patient is alert, interactive, appropriate.   Capillary refill:  Normal        Exam done by Estrella Deeds, NP on 04/09/21 at 1:25 AM    LABS & IMAGING     Recent Results (from the past 24 hour(s))   COVID-19 (SARS-CoV-2) and Influenza A/B, NAA ([facility])    Collection Time: 04/08/21  2:33 PM    Specimen: Nasopharyngeal; Culturette   Result Value Ref Range    Purpose of COVID testing Diagnostic -PUI     SARS-CoV-2 Specimen Source Nasal Swab     SARS CoV 2 Overall Result Not Detected     Influenza A Not Detected     Influenza B Not Detected    CBC and differential    Collection Time: 04/08/21  2:33 PM   Result Value Ref Range    WBC 3.44 3.10 - 9.50 x10 3/uL    Hgb 11.6 (L) 12.5 - 17.1 g/dL    Hematocrit 16.1 (L) 37.6 - 49.6 %    Platelets 85 (L) 142 - 346 x10 3/uL    RBC 3.42 (L) 4.20 - 5.90 x10 6/uL    MCV 104.1 (H) 78.0 - 96.0 fL    MCH 33.9 (H) 25.1 - 33.5 pg    MCHC 32.6 31.5 - 35.8 g/dL    RDW 15 11 - 15 %    MPV 10.9 8.9 - 12.5 fL    Instrument Absolute Neutrophil Count 2.25 1.10 - 6.33 x10 3/uL    Neutrophils 65.4 None %    Lymphocytes Automated 20.3 None %    Monocytes 10.5 None %    Eosinophils Automated 3.2 None %     Basophils Automated 0.6 None %    Immature Granulocytes 0.0 None %    Nucleated RBC 0.0 0.0 - 0.0 /100 WBC    Neutrophils Absolute 2.25 1.10 - 6.33 x10 3/uL    Lymphocytes Absolute Automated 0.70 0.42 - 3.22 x10 3/uL    Monocytes Absolute Automated 0.36 0.21 - 0.85 x10 3/uL    Eosinophils Absolute Automated 0.11 0.00 - 0.44 x10 3/uL    Basophils Absolute Automated 0.02 0.00 - 0.08 x10 3/uL    Immature Granulocytes Absolute 0.00 0.00 - 0.07 x10 3/uL    Absolute NRBC 0.00 0.00 - 0.00 x10 3/uL   D-Dimer    Collection Time: 04/08/21  2:33 PM   Result Value Ref Range    D-Dimer 2.05 (H) 0.00 - 0.60 ug/mL FEU   Comprehensive metabolic panel    Collection Time: 04/08/21  2:33 PM   Result Value Ref Range    Glucose 119 (H) 70 - 100 mg/dL    BUN 09.6 9.0 - 04.5 mg/dL    Creatinine 1.3 0.5 - 1.5 mg/dL  Sodium 147 (H) 135 - 145 mEq/L    Potassium 4.5 3.5 - 5.3 mEq/L    Chloride 114 (H) 99 - 111 mEq/L    CO2 24 17 - 29 mEq/L    Calcium 9.2 7.9 - 10.2 mg/dL    Protein, Total 6.4 6.0 - 8.3 g/dL    Albumin 3.2 (L) 3.5 - 5.0 g/dL    AST (SGOT) 26 5 - 41 U/L    ALT 22 0 - 55 U/L    Alkaline Phosphatase 69 37 - 117 U/L    Bilirubin, Total 0.7 0.2 - 1.2 mg/dL    Globulin 3.2 2.0 - 3.6 g/dL    Albumin/Globulin Ratio 1.0 0.9 - 2.2    Anion Gap 9.0 5.0 - 15.0   High Sensitivity Troponin-I    Collection Time: 04/08/21  2:33 PM   Result Value Ref Range    hs Troponin-I 33.2 SEE BELOW ng/L   NT-proBNP    Collection Time: 04/08/21  2:33 PM   Result Value Ref Range    NT-proBNP 3,112 (H) 0 - 450 pg/mL   GFR    Collection Time: 04/08/21  2:33 PM   Result Value Ref Range    EGFR >60.0    Cell MorpHology    Collection Time: 04/08/21  2:33 PM   Result Value Ref Range    Cell Morphology Normal     Platelet Estimate Normal     Platelet Clumps Present (A)    Urinalysis Reflex to Microscopic Exam- Reflex to Culture    Collection Time: 04/08/21  3:37 PM    Specimen: Urine, Clean Catch   Result Value Ref Range    Urine Type Urine, Clean Ca     Color,  UA Yellow Colorless - Yellow    Clarity, UA Clear Clear - Hazy    Specific Gravity UA >=1.030 1.001 - 1.035    Urine pH 6.0 5.0 - 8.0    Leukocyte Esterase, UA Negative Negative    Nitrite, UA Negative Negative    Protein, UR 30 (A) Negative    Glucose, UA Negative Negative    Ketones UA Negative Negative    Urobilinogen, UA 1.0 0.2 - 2.0 mg/dL    Bilirubin, UA Small (A) Negative    Blood, UA Negative Negative    RBC, UA 11-25 (A) 0 - 5 /hpf    WBC, UA 11-25 (A) 0 - 5 /hpf    Squamous Epithelial Cells, Urine 0-5 0 - 25 /hpf    Hyaline Casts, UA 0-2 0 - 5 /lpf   APTT    Collection Time: 04/08/21  6:20 PM   Result Value Ref Range    PTT 36 27 - 39 sec   Anti-Xa, UFH    Collection Time: 04/08/21  6:20 PM   Result Value Ref Range    Anti-Xa, UFH <0.04 See notes IU/mL   CBC without differential    Collection Time: 04/08/21  6:20 PM   Result Value Ref Range    WBC 3.16 3.10 - 9.50 x10 3/uL    Hgb 12.0 (L) 12.5 - 17.1 g/dL    Hematocrit 16.1 (L) 37.6 - 49.6 %    Platelets 84 (L) 142 - 346 x10 3/uL    RBC 3.54 (L) 4.20 - 5.90 x10 6/uL    MCV 104.2 (H) 78.0 - 96.0 fL    MCH 33.9 (H) 25.1 - 33.5 pg    MCHC 32.5 31.5 - 35.8 g/dL    RDW 14 11 -  15 %    MPV 11.6 8.9 - 12.5 fL    Nucleated RBC 0.0 0.0 - 0.0 /100 WBC    Absolute NRBC 0.00 0.00 - 0.00 x10 3/uL   NT-proBNP    Collection Time: 04/09/21  1:10 AM   Result Value Ref Range    NT-proBNP 4,806 (H) 0 - 450 pg/mL   Lactic Acid    Collection Time: 04/09/21  1:10 AM   Result Value Ref Range    Lactic Acid 2.3 (H) 0.2 - 2.0 mmol/L   High Sensitivity Troponin-I at 0 hrs    Collection Time: 04/09/21  1:10 AM   Result Value Ref Range    hs Troponin-I 26.2 SEE BELOW ng/L       MICROBIOLOGY:  Blood Culture: Not indicated  Urine Culture: Not indicated  Antibiotics Started: Not indicated    IMAGING:  Upon my review: CT show pulmonary embolism with suspected right heart strain    CARDIAC:  EKG Interpretation (upon my review):  EKG: Sinus rhythm with PVCs RBBB, no ischemic changes; no  significant changes when compared to EKG September 2022.    Markers:  Recent Labs   Lab 04/09/21  0110 04/08/21  1433   hs Troponin-I 26.2 33.2       EMERGENCY DEPARTMENT COURSE:  Orders Placed This Encounter   Procedures    US Venous Insufficency Duplx Dopp Low Extrem Comp Bilat    Anti-Xa, UFH    CBC without differential    NT-proBNP    Lactic Acid    High Sensitivity Troponin-I at 0 hrs    High Sensitivity Troponin-I at 2 hrs with calculated Delta    Diet heart healthy    Education: Anticoagulation    Education: Heparin    Notify physician (specify) Allergic to Heparin/Pork Products    Notify physician (specify) Signs/Symptoms of Bleeding    Notify physician If 2 successive anti-Xa <0.1 IU/mL    Vital signs    Pulse Oximetry    Progressive Mobility Protocol    Notify physician    NSG Communication: Glucose POCT order (PRN hypoglycemia)    I/O    Height    Weight    Skin assessment    Nursing communication: Adult Hypoglycemia Treatment Algorithm    Education: Activity    Education: Disease Process & Condition    Education: Pain Management    Education: Falls Risk    Education: Smoking Cessation    Pulse Oximetry while ambulating-perform within 24 hours of discharge date    Telemetry Box Communication    Nasal Cannula Low-Flow (5 lpm or Less)    Full Code    Echocardiogram Adult Complete W Clr/ Dopp Waveform    Echocardiogram Adult Complete W Clr/ Dopp Waveform    Saline lock IV    Admit to Inpatient    BLEEDING PRECAUTIONS       ASSESSMENT & PLAN     Pulmonary embolism right lower lobe, suspected right heart strain  CTA chest: Segmental and subsegmental pulmonary emboli in the right lower lobe. Mild straightening of the interventricular septum as well as reflux of contrast into the intrahepatic IVC, suggesting right heart strain.   Admit to Med Surg  Vital signs per unit protocol  Supplemental oxygen to maintain SPO2 > 95%  Continuous cardiac and pulse ox monitoring  Continue heparin drip initiated at [facility];   titrate / manage per hosp protocol  Check troponins  Echocardiogram in AM  Lower extremity venous duplex in AM  History dementia  Continue home medications  Continue follow-up management with outpatient provider(s)    History cardiomyopathy  History of CAD s/p CABG  History combined systolic and diastolic heart failure  History hypertension  History of dyslipidemia  Continue home medications  Continue follow-up management with outpatient provider(s)     NUTRITON: Cardiac diet  DVT/VTE Prophylaxis: Heparin drip  ACTIVITY: OOB with assist  Anticipated medical stability for discharge:  > 2 midnights  Anticipated Discharge Needs: To be determined: Patient lives with family who provide assistance with ADLs          Patient complaint, exam findings diagnostic results and proposed plan of care discussed with Dr. Ursula Beath.      Signed,  Estrella Deeds, NP    04/09/2021 2:03 AM  Kathlene November time Elapsed: 30 min.      This note was generated by the Epic EMR Dragon speech recognition system and may contain inherent errors or omissions not intended by the user. Grammatical errors, random word insertions, deletions and pronoun errors  are occasional consequences of this technology due to software limitations. Not all errors are caught or corrected. If there are questions or concerns about the content of this note or information contained within the body of this dictation they should be addressed directly with the author for clarification.

## 2021-04-09 NOTE — UM Notes (Signed)
Order [id number]    04/09/21 0048  Admit to Inpatient  Once             Initial Review  Class: Inpatient    Patient Name: Rick [name], Rick [name]   Date of Birth [birth year]       Summary:  The patient is a 88 year old male with a history of "CAD (s/p CABG), cardiomyopathy, RBBB, combined systolic and diastolic heart failure, dementia, dyslipidemia, hypertension and cognitive impairment who was transferred from the [facility] for management of segmental and subsegmental pulmonary emboli in the RLL with changes on CTA suggesting right heart strain.  Patient initially presented to the ED complaining of cough, congestion and wheezing since 2/16.  He did not endorse any particular exacerbating or alleviating factors.  During my interview/evaluation, he reported continued shortness of breath even while at rest, but denied chest pain, palpitations and dizziness."    Care Day - 04/08/2021    Per H&P Note:  Pulmonary embolism right lower lobe, suspected right heart strain  CTA chest: Segmental and subsegmental pulmonary emboli in the right lower lobe. Mild straightening of the interventricular septum as well as reflux of contrast into the intrahepatic IVC, suggesting right heart strain.   Admit to Med Surg  Vital signs per unit protocol  Supplemental oxygen to maintain SPO2 > 95%  Continuous cardiac and pulse ox monitoring  Continue heparin drip initiated at [facility];  titrate / manage per hosp protocol  Check troponins  Echocardiogram in AM  Lower extremity venous duplex in AM         Per Cardiology Note:  #Acute pulmonary embolism-on IV heparin  -Recommending transition to Eliquis as soon as appropriate, to avoid excessive volume overload with IV heparin drip     #Acute on chronic HFrEF-at baseline his activity is fairly limited by balance, uses a cane, is not chronically on a diuretic at home.  Does not typically complain of shortness of breath with his normal light activity.  Presented with some degree of volume  overload.  proBNP elevated.  Was started on IV Lasix dosing on admission.  Still with an element of volume overload but not markedly decompensated.  -Volume management: Continue Lasix 40 mg IV twice daily and reassess volume status tomorrow  -GDMT: Not chronically on a beta-blocker due to history of bradycardia.  We will start low-dose beta-blocker due to PVCs as below (will monitor for bradycardia).  Continue Entresto but will change to his normal home dose of half a tablet twice daily due to history of symptomatic hypotension.  -Strict I's and O's, standing daily weights, fluid and sodium restriction     #Mixed ischemic/nonischemic cardiomyopathy-history of ejection fraction 30% in 2021, felt to be mixed etiology.  Echocardiogram this admission ejection fraction 20-30%.  -GDMT as above     #CAD-history of CABG in [place] in 2003.  Prior to TAVR in 2021 left heart catheterization showed patent bypass grafts as detailed.  He is having no anginal symptoms.  High sensitive troponin is normal.  No ischemic changes on EKG.   -Continue Crestor  -Not on aspirin.  Will be on full anticoagulation for his pulm embolism     #Aortic stenosis, status post TAVR-echocardiogram this admission with normally functioning TAVR.     #Ventricular ectopy-frequent PVCs on telemetry, up to approximately 20/minute.  Some brief runs of NSVT without symptoms.  In the setting of his cardiomyopathy would like to minimize PVC burden.  We will therefore start low-dose beta-blocker, Lopressor 12.5  mg twice daily.  We will plan to switch to Toprol XL on discharge.  History of reported bradycardia, will monitor heart rates on tele.  Currently not bradycardic.  We will add magnesium to labs.  -Patient patient is a full code, potentially discuss candidacy for primary prevention ICD with family, given his persistent cardiomyopathy despite maximally tolerated GDMT, but that would be an outpatient consideration.      Vitals:        Weight: 75.9  kg        Labs:  Cardiac Labs:  NT-proBNP: 5,311      CBC and Differential:  Hgb: 12.3  Platelets: 93      Lactic Acid: 3.1      Imaging:  -  CT Angiogram Chest (PE Study):  1. Segmental and subsegmental pulmonary emboli in the right lower lobe.  Mild straightening of the interventricular septum as well as reflux of  contrast into the intrahepatic IVC, suggesting right heart strain.      US Venous Low Extrem Duplx Dopp Comp Bilat    NO EVIDENCE   OF INTRALUMINAL THROMBUS OR OBSTRUCTION TO VENOUS FLOW IN RIGHT OR LEFT   LOWER EXTREMITY.     PULSATILE VENOUS FLOW MAY REFLECT RIGHT HEART DYSFUNCTION AND/OR FLUID   OVERLOAD.         Medications:  Current Facility-Administered Medications   Medication Dose Route Frequency    apixaban  10 mg Oral Q12H SCH    Followed by    Melene Muller ON 04/16/2021] apixaban  5 mg Oral Q12H SCH    docusate sodium  100 mg Oral BID    furosemide  40 mg Intravenous BID    memantine  10 mg Oral BID    metoprolol tartrate  12.5 mg Oral Q12H SCH    rosuvastatin  10 mg Oral QHS    sacubitril-valsartan  0.5 tablet Oral BID       Continuous IV Infusions:   heparin infusion 25,000 units/500 mL (VTE/Moderate Intensity) 18 Units/kg/hr (04/09/21 0123)       PRN Medications:  Melatonin 3 mg PO PRN x 1      Plan of Care:   -  X-Ray chest  -  CT Angiogram Chest (PE Study)  -  US Venous Insufficency Duplx Dopp Low Extrem Comp Bilat  -  Echocardiogram Adult Complete W Clr/ Dopp Waveform  -  High Sensitivity Troponin-I   -  Continuous Heparin IV infusion  -  IV Furosemide BID  -  Telemetry monitoring  -  Continuous Pulse oximetry  -  Cardiology consult        Doree Albee RN, BSN  UR Case Manager  [facility]  [email or web address]  Phone: [phone]  Fax: [phone]  9:42 PM  04/09/2021      UTILIZATION REVIEW CONTACT: Name:  Doree Albee, RN BSN  Utilization Review  [facility]  Address:  [address] [place], [place] [address]  NPI:   [id number]  Tax ID:  [id number]  Phone:  [phone], Option 2  Fax:  [phone]    Please use fax number [phone] to provide authorization for hospital services or to request additional information.        NOTES TO REVIEWER:    This clinical review is based on/compiled from documentation provided by the treatment team within the patient's medical record.

## 2021-04-09 NOTE — ED Notes (Signed)
Pt's son told this RN, he wanted to take patient to [facility]  by himself. Pt 's son states " We have been here for a long time, I cannot wait for the ambulance to come for him, I can take him with my car ". This RN explained to the pt's son  the reasons why pt should be transported by health professionals, and that it was not safe for pt to go by private vehicle. Pt's son insisted stating " Yeah I know the liability involved, but I want to take him." This RN , told pt's son she will inform the attending provider to come and talk to him. This RN spoke with Dr. Jeanie Sewer, the attending provider, that  the pt's son is insisting on taking pt to [facility] by POV.     Dr Jeanie Sewer agreed to pt going POV.

## 2021-04-09 NOTE — Plan of Care (Addendum)
NURSING SHIFT NOTE     Patient: Rick [name]  Day: 1      SHIFT EVENTS     Shift Narrative/Significant Events (PRN med administration, fall, RRT, etc.):     A/Ox3-4. RA. NSR with frequent PVCs. Pt denies pain, SOB, or dizziness. Pt went for doppler of lower extremities today. VSS. Pt with good output for shift since pt is on lasix. Cardiology rounded.     Safety and fall precautions remain in place. Purposeful rounding completed.          ASSESSMENT     Changes in assessment from patient's baseline this shift:    Neuro: No  CV: Yes frequent PVCs  Pulm: Yes wheezing   Peripheral Vascular: No  HEENT: No  GI: No  BM during shift: Yes, Last BM:  04/09/21  GU: No   Integ: No  MS: No    Pain: None  Pain Interventions: None  Medications Utilized: N/a    Mobility: PMP Activity: Step 5 - Chair of Distance Walked (ft) (Step 6,7): 0 Feet           Lines     Patient Lines/Drains/Airways Status       Active Lines, Drains and Airways       Name Placement date Placement time Site Days    Peripheral IV 04/08/21 20 G Anterior;Left Upper Arm 04/08/21  2000  Upper Arm  less than 1    Peripheral IV 04/08/21 20 G Right Antecubital 04/08/21  2000  Antecubital  less than 1                         VITAL SIGNS     Vitals:    04/09/21 1605   BP: 151/77   Pulse: (!) 58   Resp: 20   Temp: 97.7 F (36.5 C)   SpO2: 95%       Temp  Min: 97.5 F (36.4 C)  Max: 98.1 F (36.7 C)  Pulse  Min: 58  Max: 123  Resp  Min: 18  Max: 21  BP  Min: 132/77  Max: 160/85  SpO2  Min: 95 %  Max: 99 %      Intake/Output Summary (Last 24 hours) at 04/09/2021 1626  Last data filed at 04/09/2021 1307  Gross per 24 hour   Intake 390 ml   Output --   Net 390 ml              CARE PLAN        Problem: Moderate/High Fall Risk Score >5  Goal: Patient will remain free of falls  Flowsheets (Taken 04/09/2021 1609)  High (Greater than 13):   HIGH-Bed alarm on at all times while patient in bed   HIGH-Apply yellow "Fall Risk" arm band   HIGH-Initiate use of floor mats  as appropriate   HIGH-Consider use of low bed     Problem: Bleeding Precautions  Goal: Free from bleeding  Outcome: Progressing  Flowsheets (Taken 04/09/2021 0153 by Lanna PocheFlores, Mayra, RN)  Free from bleeding:   Monitor/assess site of invasive procedure for signs of bleeding   Report signs of bleeding     Problem: Hemodynamic Status: Cardiac  Goal: Stable vital signs and fluid balance  Outcome: Progressing  Flowsheets (Taken 04/09/2021 1609)  Stable vital signs and fluid balance:   Monitor/assess vital signs and telemetry per unit protocol   Weigh on admission and record weight daily   Assess signs and  symptoms associated with cardiac rhythm changes   Monitor intake/output per unit protocol and/or LIP order   Monitor lab values   Monitor for leg swelling/edema and report to LIP if abnormal     Problem: Ineffective Gas Exchange  Goal: Effective breathing pattern  Outcome: Progressing  Flowsheets (Taken 04/09/2021 1609)  Effective breathing pattern: Maintain O2 saturation level per LIP order     Problem: Impaired Mobility  Goal: Mobility/Activity is maintained at optimal level for patient  Flowsheets (Taken 04/09/2021 1609)  Mobility/activity is maintained at optimal level for patient:   Increase mobility as tolerated/progressive mobility   Encourage independent activity per ability   Perform active/passive ROM   Plan activities to conserve energy, plan rest periods   Consult/collaborate with Physical Therapy and/or Occupational Therapy   Assess for changes in respiratory status, level of consciousness and/or development of fatigue

## 2021-04-09 NOTE — Progress Notes (Signed)
Initial Case Management Assessment and Discharge Planning  [facility]   Patient Name: Rick [name], Rick [name]   Date of Birth 08-10-1924   Attending Physician: Majel Homer, MD   Primary Care Physician: Erasmo Score, MD   Length of Stay 1   Reason for Consult / Chief Complaint Initial IDPA        Situation   Admission DX:   1. Pulmonary embolism with acute cor pulmonale, unspecified chronicity, unspecified pulmonary embolism type        A/O Status: Partially    LACE Score: 7    Patient admitted from: ER  Admission Status: inpatient    Health Care Agent: Child  Name: Loney      Background     Advanced directive:   Not Received    Code Status:   Full Code     Residence: Apartment    PCP: Erasmo Score, MD  Patient Contact:   [phone] (home)     [phone] (mobile)     Emergency contact:   Extended Emergency Contact Information  Primary Emergency Contact: Glyn Ade  Address: [address]           [place], [place] [address] [place]  Home Phone: [phone]  Mobile Phone: [phone]  Relation: Son  Secondary Emergency Contact: Loretto,Debra  Home Phone: [phone]  Relation: Daughter in law      ADL/IADL's: Independent  Previous Level of function: 5 Supervision     DME: None    Pharmacy:     [facility] [place], [place] - [address] [facility]  [address] [facility]  [place] [address]  Phone: [phone] Fax: [phone]    [facility] [address] [place] - [address]  [address]  [place] [address]  Phone: [phone] Fax: [phone]      Prescription Coverage: Yes    Home Health: The patient is not currently receiving home health services.    Previous SNF/AR: SNF    COVID Vaccine Status: Two doses; One booster    Date First IMM given:   UAI on file?: No  Transport for discharge? Mode of transportation: Sales executive - Family/Friend to drive patient  Agreeable to Home with family post-discharge:   Yes     Assessment   CM met with patient at bedside; verified facesheet and PCP. Pt resides with son and daughter-in-law in apartment home. Pt denies using DME to ambulate. Pt has a history of HH services.  Patient states that they feel safe at home and has sufficient support should they need it.   Patient has no financial needs at this time.    Pt's family to provide transportation.    BARRIERS TO DISCHARGE: Medical Stability      Recommendation   D/C Plan A: Home with family    D/C Plan B: Home with family    D/C Plan C:        CM will coordinate care.    [place], MSW  Social Work Futures trader I  [facility]  [phone]

## 2021-04-09 NOTE — Progress Notes (Signed)
[facility]  PROGRESS NOTE      Patient: Rick [name]  Date: 04/09/2021   LOS: 1 Days  Admission Date: 04/08/2021   MRN: [id number]  Attending: Majel Homer, MD  Please contact me on Epic secure chat            SUBJECTIVE     Patient seen and examined at bedside with RN.  Denies fever, chills, chest pain, shortness of breath, abdominal pain, nausea, vomiting, diarrhea.        OBJECTIVE     Vitals:    04/09/21 0403   BP: 132/77   Pulse: 65   Resp: 21   Temp: 97.5 F (36.4 C)   SpO2: 97%       Temperature: Temp  Min: 97.5 F (36.4 C)  Max: 98.5 F (36.9 C)  Pulse: Pulse  Min: 57  Max: 123  Respiratory: Resp  Min: 16  Max: 21  Non-Invasive BP: BP  Min: 132/77  Max: 160/85  Pulse Oximetry SpO2  Min: 96 %  Max: 100 %    Intake and Output Summary (Last 24 hours) at Date Time  No intake or output data in the 24 hours ending 04/09/21 0718         PHYSICAL EXAMINATION  GEN APPEARANCE: NAD.  Pleasant, alert and cooperative. Comfortable.   HEENT: NCAT, EOMI, PERRL, no nasal discharge.   conjunctivae/corneas clear. Oral mucosa moist.   Neck: Supple without meningismus, no JVD   CVS: RRR, S1, S2. No murmur, click, rub, or gallop   LUNGS: CTAB; No wheezes, crackles, rhonchi or rales  ABD: BS+, soft, NT, ND, no guarding or rigidity  EXT: No edema; Pulses 2+ and intact  SKIN: skin is warm and dry. No rash noted.  MSK: full ROM intact. No joint tenderness or swelling.   NEURO: AAO, CN 2-12 intact.  No focal neurological deficits  MENTAL STATUS: appropriate affect and mood, pleasantly confused         MEDICATIONS     Current Facility-Administered Medications   Medication Dose Route Frequency    docusate sodium  100 mg Oral BID    memantine  10 mg Oral BID    rosuvastatin  10 mg Oral QHS    sacubitril-valsartan  1 tablet Oral BID            LABS/IMAGING     Recent Labs   Lab 04/08/21  1820 04/08/21  1433   WBC 3.16 3.44   RBC 3.54* 3.42*   Hgb 12.0* 11.6*   Hematocrit 36.9* 35.6*   MCV 104.2* 104.1*   Platelets  84* 85*       Recent Labs   Lab 04/08/21  1433   Sodium 147*   Potassium 4.5   Chloride 114*   CO2 24   BUN 26.0   Creatinine 1.3   Glucose 119*   Calcium 9.2       Recent Labs   Lab 04/08/21  1433   ALT 22   AST (SGOT) 26   Bilirubin, Total 0.7   Albumin 3.2*   Alkaline Phosphatase 69       Recent Labs   Lab 04/09/21  0110 04/08/21  1433   hs Troponin-I 26.2 33.2       Recent Labs   Lab 04/08/21  1820   PTT 36       Microbiology Results (last 15 days)       Procedure Component Value Units Date/Time    Urine  culture [[id number]] Collected: 04/08/21 1537    Order Status: No result Specimen: Urine Updated: 04/09/21 0516    COVID-19 (SARS-CoV-2) and Influenza A/B, NAA (Liat Rapid) [[id number]] Collected: 04/08/21 1433    Order Status: Completed Specimen: Culturette from Nasopharyngeal Updated: 04/08/21 1501     Purpose of COVID testing Diagnostic -PUI     SARS-CoV-2 Specimen Source Nasal Swab     SARS CoV 2 Overall Result Not Detected     Comment: __________________________________________________  -A result of "Detected" indicates POSITIVE for the    presence of SARS CoV-2 RNA  -A result of "Not Detected" indicates NEGATIVE for the    presence of SARS CoV-2 RNA  __________________________________________________________  Test performed using the Roche cobas Liat SARS-CoV-2 assay. This assay is  only for use under the Food and Drug Administration's Emergency Use  Authorization. This is a real-time RT-PCR assay for the qualitative  detection of SARS-CoV-2 RNA. Viral nucleic acids may persist in vivo,  independent of viability. Detection of viral nucleic acid does not imply the  presence of infectious virus, or that virus nucleic acid is the cause of  clinical symptoms. Negative results do not preclude SARS-CoV-2 infection and  should not be used as the sole basis for diagnosis, treatment or other  patient management decisions. Negative results must be combined with  clinical observations, patient history, and/or  epidemiological information.  Invalid results may be due to inhibiting substances in the specimen and  recollection should occur. Please see Fact Sheets for patients and providers  located:  [email or web address]-fact-sheets          Influenza A Not Detected     Influenza B Not Detected     Comment: Test performed using the Roche cobas Liat SARS-CoV-2 & Influenza A/B assay.  This assay is only for use under the Food and Drug Administration's  Emergency Use Authorization. This is a multiplex real-time RT-PCR assay  intended for the simultaneous in vitro qualitative detection and  differentiation of SARS-CoV-2, influenza A, and influenza B virus RNA. Viral  nucleic acids may persist in vivo, independent of viability. Detection of  viral nucleic acid does not imply the presence of infectious virus, or that  virus nucleic acid is the cause of clinical symptoms. Negative results do  not preclude SARS-CoV-2, influenza A, and/or influenza B infection and  should not be used as the sole basis for diagnosis, treatment or other  patient management decisions. Negative results must be combined with  clinical observations, patient history, and/or epidemiological information.  Invalid results may be due to inhibiting substances in the specimen and  recollection should occur. Please see Fact Sheets for patients and providers  located: http://[email or web address]/test-fact-sheets.         Narrative:      o Collect and clearly label specimen type:  o PREFERRED-Upper respiratory specimen: One Nasal Swab in  Transport Media.  o Hand deliver to laboratory ASAP  Diagnostic -PUI               Cultures  Blood cx  Urine cx   Sputum cx     Abx    CXR     CT     ASSESSMENT/PLAN   Patient Active Hospital Problem List:       Pulmonary embolism with acute cor pulmonale, unspecified chronicity, unspecified pulmonary embolism type (04/09/2021)                    Interval Summary:     Rick [name] is a 88 y.o. male PMH including  and significant for CAD (s/p CABG), cardiomyopathy, RBBB, combined systolic and diastolic heart failure, dementia, dyslipidemia, hypertension and cognitive impairment admitted with Pulmonary embolism with acute cor pulmonale, unspecified chronicity, unspecified pulmonary embolism type    Pulmonary embolism right lower lobe, suspected right heart strain  CTA chest: Segmental and subsegmental pulmonary emboli in the right lower lobe. Mild straightening of the interventricular septum as well as reflux of contrast into the intrahepatic IVC, suggesting right heart strain.   Vital signs per unit protocol  Supplemental oxygen to maintain SPO2 > 95%  Continuous cardiac and pulse ox monitoring  Continue heparin drip --> eliquis   Check troponin's trend negative  Echocardiogram below  Lower extremity venous duplex negative for dvt     Acute systolic heart failure    Last echo showed ef of  30 %.  Iv lasix and monitor   Echo The left ventricular cavity size is dilated.  Left ventricular wall  thickness is normal.  Left ventricular systolic function is severely decreased  with an estimated ejection fraction of 25-30%.    * The right ventricular cavity size is upper limits of normal in size.  Mildly decreased right ventricular systolic function.    * Severe bi-atrial dilation.    * There is a 29mm Sapien 3 TAVR valve in aortic position that is normally  functioning.    * There is severe tricuspid regurgitation.  Moderate pulmonary hypertension  with estimated right ventricular systolic pressure of 54 mmHg.    * There is moderate pulmonic valve regurgitation.    * There is mild to moderate mitral regurgitation.    * Elevated right atrial pressure.    * Compared to the prior study dated 08/25/2019 (personally reviewed),  tricuspid regurgitation increased. There are frequent PVC's throughout the  consult cardiology         History dementia  Continue home medications  Continue follow-up management with outpatient provider(s)       Hx  of CAD s/p CABG   Hx of TAVR   Hx combined systolic and diastolic heart failure  Hx hypertension  Hx of dyslipidemia  Continue home medications  Continue follow-up management with outpatient provider(s)    Nutrition: heart healthy    Code: Fc  DVT prophylaxis: heparin  GI Prophylaxis:  Disposition: pending medical stability              Signed,  Majel Homer, MD  7:18 AM 04/09/2021         This chart was generated using hospital voice-recognition software which does not employ spell-checking or grammar-checking features. It was dictated, all or in part, in a busy and often noisy patient care environment. I have taken all usual measures to dictate carefully and to review all aspects this chart. Nonetheless, given the known and well-documented performance characteristics of VR software in such patient care environments, this dictation still may contain unrecognized and wholly unintended errors or omissions

## 2021-04-09 NOTE — Plan of Care (Signed)
NURSING SHIFT NOTE     Patient: Rick [name]  Day: 1      SHIFT EVENTS     Shift Narrative/Significant Events (PRN med administration, fall, RRT, etc.):   Hep drip started at  0123. Anti XA due at around 0830.       Safety and fall precautions remain in place. Purposeful rounding completed.          ASSESSMENT     Changes in assessment from patient's baseline this shift:    Neuro: No  CV: No  Pulm: No  Peripheral Vascular: No  HEENT: No  GI: No  BM during shift: No, Last BM:    GU: No   Integ: No  MS: No    Pain: None      Mobility: PMP Activity: Step 6 - Walks in Room of Distance Walked (ft) (Step 6,7): 12 Feet           Lines     Patient Lines/Drains/Airways Status       Active Lines, Drains and Airways       Name Placement date Placement time Site Days    Peripheral IV 04/08/21 20 G Anterior;Left Upper Arm 04/08/21  2000  Upper Arm  less than 1    Peripheral IV 04/08/21 20 G Right Antecubital 04/08/21  2000  Antecubital  less than 1                         VITAL SIGNS     Vitals:    04/09/21 0033   BP: 132/69   Pulse: 71   Resp: 18   Temp: 97.9 F (36.6 C)   SpO2: 98%       Temp  Min: 97.5 F (36.4 C)  Max: 98.5 F (36.9 C)  Pulse  Min: 57  Max: 123  Resp  Min: 16  Max: 18  BP  Min: 132/69  Max: 160/85  SpO2  Min: 96 %  Max: 100 %    No intake or output data in the 24 hours ending 04/09/21 0155           CARE PLAN        Problem: Bleeding Precautions  Goal: Free from bleeding  Outcome: Progressing  Flowsheets (Taken 04/09/2021 0153)  Free from bleeding:   Monitor/assess site of invasive procedure for signs of bleeding   Report signs of bleeding

## 2021-04-10 DIAGNOSIS — F0390 Unspecified dementia without behavioral disturbance: Secondary | ICD-10-CM

## 2021-04-10 DIAGNOSIS — R001 Bradycardia, unspecified: Secondary | ICD-10-CM

## 2021-04-10 LAB — BASIC METABOLIC PANEL
Anion Gap: 10 (ref 5.0–15.0)
BUN: 31 mg/dL — ABNORMAL HIGH (ref 9.0–28.0)
CO2: 26 mEq/L (ref 17–29)
Calcium: 9.6 mg/dL (ref 7.9–10.2)
Chloride: 106 mEq/L (ref 99–111)
Creatinine: 1.5 mg/dL (ref 0.5–1.5)
Glucose: 98 mg/dL (ref 70–100)
Potassium: 5.7 mEq/L — ABNORMAL HIGH (ref 3.5–5.3)
Sodium: 142 mEq/L (ref 135–145)

## 2021-04-10 LAB — CBC
Absolute NRBC: 0 10*3/uL (ref 0.00–0.00)
Hematocrit: 40 % (ref 37.6–49.6)
Hgb: 12.8 g/dL (ref 12.5–17.1)
MCH: 33.8 pg — ABNORMAL HIGH (ref 25.1–33.5)
MCHC: 32 g/dL (ref 31.5–35.8)
MCV: 105.5 fL — ABNORMAL HIGH (ref 78.0–96.0)
MPV: 12.2 fL (ref 8.9–12.5)
Nucleated RBC: 0 /100 WBC (ref 0.0–0.0)
Platelets: 94 10*3/uL — ABNORMAL LOW (ref 142–346)
RBC: 3.79 10*6/uL — ABNORMAL LOW (ref 4.20–5.90)
RDW: 14 % (ref 11–15)
WBC: 6.13 10*3/uL (ref 3.10–9.50)

## 2021-04-10 LAB — ANTI-XA,UFH: Anti-Xa, UFH: 1.2 IU/mL

## 2021-04-10 LAB — LACTIC ACID, PLASMA: Lactic Acid: 1.6 mmol/L (ref 0.2–2.0)

## 2021-04-10 LAB — GLUCOSE WHOLE BLOOD - POCT: Whole Blood Glucose POCT: 96 mg/dL (ref 70–100)

## 2021-04-10 LAB — MAGNESIUM: Magnesium: 1.9 mg/dL (ref 1.6–2.6)

## 2021-04-10 LAB — GFR: EGFR: 52.7

## 2021-04-10 LAB — PROBNP: NT-proBNP: 4533 pg/mL — ABNORMAL HIGH (ref 0–450)

## 2021-04-10 MED ORDER — DEXTROSE 10 % IV BOLUS
50.0000 g | Freq: Once | INTRAVENOUS | Status: AC
Start: 2021-04-10 — End: 2021-04-10
  Administered 2021-04-10: 500 mL via INTRAVENOUS

## 2021-04-10 MED ORDER — SODIUM POLYSTYRENE SULFONATE 15 GM/60ML PO SUSP
15.0000 g | Freq: Once | ORAL | Status: AC
Start: 2021-04-10 — End: 2021-04-10
  Administered 2021-04-10: 15 g via ORAL
  Filled 2021-04-10: qty 60

## 2021-04-10 MED ORDER — INSULIN SYRINGES (DISPOSABLE) U-100 1 ML MISC
5.0000 [IU] | Freq: Once | Status: AC
Start: 2021-04-10 — End: 2021-04-10
  Administered 2021-04-10: 5 [IU] via INTRAVENOUS
  Filled 2021-04-10: qty 15

## 2021-04-10 MED ORDER — CALCIUM GLUCONATE-NACL 1-0.675 GM/50ML-% IV SOLN
1.0000 g | Freq: Once | INTRAVENOUS | Status: AC
Start: 2021-04-10 — End: 2021-04-10
  Administered 2021-04-10: 1 g via INTRAVENOUS
  Filled 2021-04-10: qty 50

## 2021-04-10 MED ORDER — FUROSEMIDE 40 MG PO TABS
40.0000 mg | ORAL_TABLET | Freq: Every day | ORAL | Status: DC
Start: 2021-04-11 — End: 2021-04-11
  Administered 2021-04-11: 40 mg via ORAL
  Filled 2021-04-10: qty 1

## 2021-04-10 MED ORDER — FUROSEMIDE 10 MG/ML IJ SOLN
20.0000 mg | Freq: Once | INTRAMUSCULAR | Status: AC
Start: 2021-04-10 — End: 2021-04-10
  Administered 2021-04-10: 20 mg via INTRAVENOUS
  Filled 2021-04-10: qty 2

## 2021-04-10 MED ORDER — VANCOMYCIN HCL IN NACL 1.25-0.9 GM/250ML-% IV SOLN
1250.0000 mg | Freq: Once | INTRAVENOUS | Status: AC
Start: 2021-04-10 — End: 2021-04-11
  Administered 2021-04-10: 1250 mg via INTRAVENOUS
  Filled 2021-04-10: qty 250

## 2021-04-10 NOTE — Plan of Care (Signed)
Problem: Moderate/High Fall Risk Score >5  Goal: Patient will remain free of falls  Outcome: Not Progressing     Problem: Compromised Hemodynamic Status  Goal: Vital signs and fluid balance maintained/improved  Outcome: Progressing     Problem: Hemodynamic Status: Cardiac  Goal: Stable vital signs and fluid balance  Outcome: Progressing     Problem: Ineffective Gas Exchange  Goal: Effective breathing pattern  Outcome: Progressing     Problem: Impaired Mobility  Goal: Mobility/Activity is maintained at optimal level for patient  Outcome: Progressing

## 2021-04-10 NOTE — PT Eval Note (Signed)
Physical Therapy Eval and Treatment  Rick [name]      Post Acute Care Therapy Recommendations   Discharge Recommendations:  Home with supervision, Home with home health PT, Home with home health OT  D/C Milestones: ambulate with CGA Anticipate achievement in 1 sessions    If recommended discharge disposition is not available, patient will require the following assistance: SNF    DME needs IF patient is discharging home: Front wheel walker, [facility]    Therapy discharge recommendations may change with patient status.  Please refer to most recent note for up-to-date recommendations.    Is an Occupational Therapy Evaluation Indicated at this time? Yes, an acute care OT evaluation is indicated at this time and should be performed prior to hospital discharge to assist with ADL DME recommendations and/or D/C placement recommendations.    Unit: [address] [facility]  Bed: A2521/A2521-B      ___________________________________________________    Time of Evaluation and Treatment:  Time Calculation  PT Received On: 04/10/21  Start Time: 1124  Stop Time: 1146  Time Calculation (min): 22 min    Evaluation Time: 11 minutes  Treatment Time: 11 minutes    Chart Review and Collaboration with Care Team: 5 minutes, not included in above time    PT Visit Number: 1    Consult received for Rick [name] for PT Evaluation and Treatment.  Patient's medical condition is appropriate for Physical therapy intervention at this time.    Activity Orders:  PT eval and treat and progressive mobility protocol    Precautions and Contraindications:  Precautions  Weight Bearing Status: no restrictions  Other Precautions: bleeding, falls, urinary incontinence    Personal Protective Equipment (PPE)  gloves and procedure mask    Medical Diagnosis:  Acute pulmonary embolism with acute cor pulmonale, unspecified pulmonary embolism type [I26.09]  Pulmonary embolism with acute cor pulmonale, unspecified chronicity, unspecified  pulmonary embolism type [I26.09]    History of Present Illness:  Rick [name] is a 88 y.o. male admitted on 04/08/2021 with   PMH including and significant for CAD (s/p CABG), cardiomyopathy, RBBB, combined systolic and diastolic heart failure, dementia, dyslipidemia, hypertension and cognitive impairment who was transferred from the [facility] for management of segmental and subsegmental pulmonary emboli in the RLL with changes on CTA suggesting right heart strain.  Patient initially presented to the ED complaining of cough, congestion and wheezing since 2/16.  He did not endorse any particular exacerbating or alleviating factors.  During my interview/evaluation, he reported continued shortness of breath even while at rest, but denied chest pain, palpitations and dizziness.    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    Dry cough    Aortic valve stenosis, etiology of cardiac valve disease unspecified    S/P TAVR (transcatheter aortic valve replacement)    Difficulty in walking, not elsewhere classified    Dementia without behavioral disturbance, unspecified dementia type    Balance disorder    Chronic HFrEF (heart failure with reduced ejection fraction)    Acute pulmonary embolism with acute cor pulmonale, unspecified pulmonary embolism type    Pulmonary embolism with acute cor pulmonale, unspecified chronicity, unspecified pulmonary embolism type  Past Medical/Surgical History:  Past Medical History:   Diagnosis Date    Atrial enlargement, bilateral Echo result 04/04/19    CAD (coronary artery disease) 05/28/19 Cath results.    Severe 3V CAD.  Patent LIMA to LAD, patent SVG to OM 1 and OM 2 of circumflex CA, & patent SVG to RCA.    Chronic  combined systolic and diastolic heart failure 310/21 OV Dr. Penny Pia    EF=30%    Dementia 04/2014    Difficulty walking 06/2016    Post surgery of lt knee, rt femur    Dyslipidemia 310/21 OV Dr. Penny Pia    Dyspnea on exertion 02/2018    HTN (hypertension) 310/21 OV Dr. Penny Pia    Mild cognitive impairment 310/21 OV Dr. Penny Pia    Primary cardiomyopathy 11/2017    RBBB 07/10/19 EKg result    S/P CABG (coronary artery bypass graft)      LIMA to LAD,  SVG to OM 1 & OM 2 of circumflex CA, & SVG to RCA.     Past Surgical History:   Procedure Laterality Date    CORONARY ARTERY BYPASS GRAFT  2003     approx year: LIMA to LAD,  SVG to OM 1 & OM 2 of circumflex CA, & SVG to RCA    FRACTURE SURGERY Right 06/2016    femur    PERCUTANEOUS VALVULOPLASTY - AORTIC N/A 07/12/2019    Procedure: TAVR #2;  Surgeon: Arlyss Queen, MD PhD;  Location: [facility];  Service: Cardiovascular;  Laterality: N/A;  23 HR OB    REPLACEMENT, AORTIC VALVE , EDWARDS SAPIEN N/A 07/12/2019    Procedure: REPLACEMENT, AORTIC VALVE , EDWARDS SAPIEN -- TAVR Valve:   S3 29mm -- Access: RTF -- IC: Dr. Penny Pia;  Surgeon: Christene Lye, MD;  Location: [facility];  Service: Cardiothoracic;  Laterality: N/A;    RIGHT & LEFT HEART CATH POSSIBLE PCI N/A 05/26/2019    Procedure: RIGHT & LEFT HEART CATH POSSIBLE PCI;  Surgeon: Arlyss Queen, MD PhD;  Location: [facility];  Service: Cardiovascular;  Laterality: N/A;  [id number] scheduled with Maira in dr's office. OP. kr    TAVR PERCUTANEOUS FEMORAL  07/13/2019         TOTAL KNEE ARTHROPLASTY Left 2017    TOTAL KNEE ARTHROPLASTY Right 2005       X-Rays/Tests/Labs:  Lab Results   Component Value Date/Time    HGB 12.8 04/10/2021 04:39 AM    HCT 40.0 04/10/2021 04:39 AM    K 5.7 (H) 04/10/2021 04:39 AM    NA 142 04/10/2021 04:39 AM    INR 1.2 (H) 07/10/2019 02:22 PM    TROPI 26.2 04/09/2021 01:10 AM    TROPI 33.2 04/08/2021 02:33 PM       All imaging reviewed, please see chart for  details.    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 Nursing clears patient for therapy.  Patient Goal: get gown pulled up  Pain Assessment  Pain Assessment: No/denies pain      Objective:  Observation of Patient/Vital Signs:  BP 164/81   Pulse (!) 52   Temp 97.5 F (36.4 C) (Oral)   Resp 16   Ht 1.676 m (5\' 6" )   Wt 73.7 kg (162 lb 7.7 oz)   SpO2 98%   BMI 26.22 kg/m   VSS     Inspection/Posture: pt received supine in bed, ext cath    Cognitive Status and Neuro Exam:  Cognition/Neuro Status  Arousal/Alertness: Appropriate responses to stimuli  Attention Span: Attends to task with redirection  Orientation Level: Disoriented to place;Disoriented to time;Oriented to situation;Oriented to person  Memory: Decreased recall of recent events;Decreased recall of biographical information  Following Commands: Follows one step commands without difficulty  Safety Awareness: moderate verbal instruction  Insights: Decreased awareness of deficits;Educated in safety awareness  Problem Solving: minimal assistance  Behavior: cooperative;flat affect  Motor Planning: decreased processing speed  Coordination: GMC impaired    Musculoskeletal Examination  Gross ROM  Right Lower Extremity ROM: within functional limits  Left Lower Extremity ROM: within functional limits  Gross Strength  Right Lower Extremity Strength: 3+/5  Left  Lower Extremity Strength: 3+/5       Functional Mobility:  Functional Mobility  Supine to Sit: Minimal Assist  Scooting to HOB: Maximal Assist  Scooting to EOB: Contact Guard Assist  Sit to Supine: Minimal Assist;to Right  Sit to Stand: Contact Guard Assist;with instruction for hand placement to increase safety;bed elevated  Stand to Sit: Nurse, learning disability  Ambulation: Unable to assess (Comment) (able to take sidesteps towards [facility] after episode of voiding)     Balance  Balance  Balance: needs focused assessment  Sitting - Static: Good  Sitting - Dynamic: Good  Standing - Static: Fair  Standing - Dynamic: Fair    Participation and Activity Tolerance  Participation and Endurance  Participation Effort: good  Endurance: Tolerates < 10 min exercise, no significant change in vital signs  [place] Dyspnea Scale: 1+ Dyspnea    Educated the Patient to role of physical therapy, plan of care, goals of therapy and safety with mobility and ADLs, energy conservation techniques, discharge instructions, home safety with verbalized understanding .    Patient left in bed with alarm and all other medical equipment in place and call bell and all personal items/needs within reach.  RN notified of session outcome.        Assessment:  Rick [name] is a 88 y.o. male admitted 04/08/2021.  PT Assessment  Assessment: Decreased LE ROM;Decreased LE strength;Decreased safety/judgement during functional mobility;Decreased cognition;Decreased endurance/activity tolerance;Impaired coordination;Decreased functional mobility;Gait impairment;Decreased balance  Prognosis: Good;With continued PT status post acute discharge  Progress: Progressing toward goals      Treatment:  -bed mobility with VC for compensatory strategies and facilitation of Les as needed  -STS x 2 with CGA and instr for hand placement. Pt had episode of incontinence in standing, able to reposition by laterally stepping towards HOB.        Plan:  Treatment/Interventions: Exercise, Gait training, Neuromuscular re-education, LE strengthening/ROM, Functional transfer training, Endurance training, Patient/family training, Cognitive reorientation, Equipment eval/education, Bed mobility, Compensatory technique education  PT Frequency: 1-2x/wk  Risks/Benefits/POC Discussed with Pt/Family: With patient    PMP Activity: Step 4 - Dangle at Bedside (STS x 2 with few sidesteps)  Distance Walked (ft) (Step 6,7): 4 Feet      Goals:  Goals  Goal Formulation: With patient  Time for Goal Acheivement: By time of discharge  Goals: Select goal  Pt Will Go Supine To Sit: with stand by assist, to maximize functional mobility and independence, Partly met  Pt Will Achieve Sitting Balance: to maximize functional mobility and independence, 4/5 moves/returns trunkal midpoint 1-2 inches in multiple planes, Partly met  Pt Will Perform Sit to Stand: with supervision, to maximize functional mobility and independence, Partly met  Pt Will Transfer Bed/Chair: with rolling walker, with stand by assist, to maximize functional mobility and independence, Not met  Pt Will Ambulate: 31-50 feet, with rolling walker, to maximize functional mobility and independence, with contact guard assist, Not met    Rick [name], PT, DPT   [place] License #[id number]  [facility]  M 10-6:30, T 10-4:30, W 10-5:30, Th 10-8:30 F 11-8:30  Spectralink [id number]  04/10/2021 12:00 PM      [facility]  Patient: Rick [name] MRN#: [id number]  Unit: [address] [facility] Bed: I6962/X5284-X

## 2021-04-10 NOTE — Progress Notes (Signed)
[facility]  PROGRESS NOTE      Patient: Rick [name]  Date: 04/10/2021   LOS: 2 Days  Admission Date: 04/08/2021   MRN: [id number]  Attending: Majel Homer, MD  Please contact me on Epic secure chat            SUBJECTIVE     Patient seen and examined at bedside with RN.  Denies fever, chills, chest pain, shortness of breath, abdominal pain, nausea, vomiting, diarrhea.        OBJECTIVE     Vitals:    04/10/21 0804   BP: 151/82   Pulse: (!) 58   Resp: 19   Temp: 97.9 F (36.6 C)   SpO2: 97%       Temperature: Temp  Min: 97.5 F (36.4 C)  Max: 98.2 F (36.8 C)  Pulse: Pulse  Min: 39  Max: 76  Respiratory: Resp  Min: 18  Max: 20  Non-Invasive BP: BP  Min: 126/74  Max: 151/82  Pulse Oximetry SpO2  Min: 95 %  Max: 100 %    Intake and Output Summary (Last 24 hours) at Date Time    Intake/Output Summary (Last 24 hours) at 04/10/2021 0919  Last data filed at 04/10/2021 0600  Gross per 24 hour   Intake 360 ml   Output 1620 ml   Net -1260 ml            PHYSICAL EXAMINATION  GEN APPEARANCE: NAD.  Pleasant, alert and cooperative. Comfortable.   HEENT: NCAT, EOMI, PERRL, no nasal discharge.   conjunctivae/corneas clear. Oral mucosa moist.   Neck: Supple without meningismus, no JVD   CVS: RRR, S1, S2. No murmur, click, rub, or gallop   LUNGS: CTAB; No wheezes, crackles, rhonchi or rales  ABD: BS+, soft, NT, ND, no guarding or rigidity  EXT: No edema; Pulses 2+ and intact  SKIN: skin is warm and dry. No rash noted.  MSK: full ROM intact. No joint tenderness or swelling.   NEURO: AAO, CN 2-12 intact.  No focal neurological deficits  MENTAL STATUS: appropriate affect and mood, pleasantly confused         MEDICATIONS     Current Facility-Administered Medications   Medication Dose Route Frequency    apixaban  10 mg Oral Q12H SCH    Followed by    Melene Muller ON 04/16/2021] apixaban  5 mg Oral Q12H SCH    docusate sodium  100 mg Oral BID    furosemide  40 mg Intravenous BID    memantine  10 mg Oral BID    metoprolol  tartrate  12.5 mg Oral Q12H SCH    rosuvastatin  10 mg Oral QHS    sacubitril-valsartan  0.5 tablet Oral BID            LABS/IMAGING     Recent Labs   Lab 04/10/21  0439 04/09/21  2109 04/08/21  1820   WBC 6.13 7.61 3.16   RBC 3.79* 3.63* 3.54*   Hgb 12.8 12.3* 12.0*   Hematocrit 40.0 38.5 36.9*   MCV 105.5* 106.1* 104.2*   Platelets 94* 93* 84*         Recent Labs   Lab 04/10/21  0439 04/09/21  2109 04/08/21  1433   Sodium  --  144 147*   Potassium  --  4.3 4.5   Chloride  --  107 114*   CO2  --  30* 24   BUN  --  28.0 26.0  Creatinine  --  1.6* 1.3   Glucose  --  95 119*   Calcium  --  9.4 9.2   Magnesium 1.9  --   --          Recent Labs   Lab 04/08/21  1433   ALT 22   AST (SGOT) 26   Bilirubin, Total 0.7   Albumin 3.2*   Alkaline Phosphatase 69         Recent Labs   Lab 04/09/21  0110 04/08/21  1433   hs Troponin-I 26.2 33.2         Recent Labs   Lab 04/08/21  1820   PTT 36         Microbiology Results (last 15 days)       Procedure Component Value Units Date/Time    Urine culture [[id number]] Collected: 04/08/21 1537    Order Status: Completed Specimen: Bladder Updated: 04/10/21 0502    Narrative:      ORDER#: [id number]                                    ORDERED BY: REDDEN, LUKE  SOURCE: Urine                                        COLLECTED:  04/08/21 15:37  ANTIBIOTICS AT COLL.:                                RECEIVED :  04/09/21 05:16  Culture Urine                              PRELIM      04/10/21 05:02   +  04/10/21   50,000 - 70,000 CFU/ML Staphylococcus aureus               Further workup to follow including susceptibility testing        COVID-19 (SARS-CoV-2) and Influenza A/B, NAA (Liat Rapid) [[id number]] Collected: 04/08/21 1433    Order Status: Completed Specimen: Culturette from Nasopharyngeal Updated: 04/08/21 1501     Purpose of COVID testing Diagnostic -PUI     SARS-CoV-2 Specimen Source Nasal Swab     SARS CoV 2 Overall Result Not Detected     Comment:  __________________________________________________  -A result of "Detected" indicates POSITIVE for the    presence of SARS CoV-2 RNA  -A result of "Not Detected" indicates NEGATIVE for the    presence of SARS CoV-2 RNA  __________________________________________________________  Test performed using the Roche cobas Liat SARS-CoV-2 assay. This assay is  only for use under the Food and Drug Administration's Emergency Use  Authorization. This is a real-time RT-PCR assay for the qualitative  detection of SARS-CoV-2 RNA. Viral nucleic acids may persist in vivo,  independent of viability. Detection of viral nucleic acid does not imply the  presence of infectious virus, or that virus nucleic acid is the cause of  clinical symptoms. Negative results do not preclude SARS-CoV-2 infection and  should not be used as the sole basis for diagnosis, treatment or other  patient management decisions. Negative results must be combined with  clinical observations, patient history, and/or epidemiological information.  Invalid results may be due  to inhibiting substances in the specimen and  recollection should occur. Please see Fact Sheets for patients and providers  located:  WirelessDSLBlog.[email or web address]/test-fact-sheets          Influenza A Not Detected     Influenza B Not Detected     Comment: Test performed using the Roche cobas Liat SARS-CoV-2 & Influenza A/B assay.  This assay is only for use under the Food and Drug Administration's  Emergency Use Authorization. This is a multiplex real-time RT-PCR assay  intended for the simultaneous in vitro qualitative detection and  differentiation of SARS-CoV-2, influenza A, and influenza B virus RNA. Viral  nucleic acids may persist in vivo, independent of viability. Detection of  viral nucleic acid does not imply the presence of infectious virus, or that  virus nucleic acid is the cause of clinical symptoms. Negative results do  not preclude SARS-CoV-2, influenza A, and/or influenza B  infection and  should not be used as the sole basis for diagnosis, treatment or other  patient management decisions. Negative results must be combined with  clinical observations, patient history, and/or epidemiological information.  Invalid results may be due to inhibiting substances in the specimen and  recollection should occur. Please see Fact Sheets for patients and providers  located: http://www.[email or web address]/www..[email or web address]/[email or web address]/test-fact-sheets.         Narrative:      o Collect and clearly label specimen type:  o PREFERRED-Upper respiratory specimen: One Nasal Swab in  Transport Media.  o Hand deliver to laboratory ASAP  Diagnostic -PUI               Cultures  Blood cx  Urine cx   Sputum cx     Abx    CXR     CT     ASSESSMENT/PLAN   Patient Active Hospital Problem List:       Pulmonary embolism with acute cor pulmonale, unspecified chronicity, unspecified pulmonary embolism type (04/09/2021)                    Interval Summary:     Rick [name] is a 88 y.o. male PMH including and significant for CAD (s/p CABG), cardiomyopathy, RBBB, combined systolic and diastolic heart failure, dementia, dyslipidemia, hypertension and cognitive impairment admitted with Pulmonary embolism with acute cor pulmonale, unspecified chronicity, unspecified pulmonary embolism type    Pulmonary embolism right lower lobe, suspected right heart strain  CTA chest: Segmental and subsegmental pulmonary emboli in the right lower lobe. Mild straightening of the interventricular septum as well as reflux of contrast into the intrahepatic IVC, suggesting right heart strain.   Vital signs per unit protocol  Supplemental oxygen to maintain SPO2 > 95%  Continuous cardiac and pulse ox monitoring  Continue heparin drip --> eliquis   Check troponin's trend negative  Echocardiogram below  Lower extremity venous duplex negative for dvt     Acute systolic heart failure    Last echo showed ef of  30 %.  Iv lasix and monitor   Echo The left ventricular cavity size  is dilated.  Left ventricular wall  thickness is normal.  Left ventricular systolic function is severely decreased  with an estimated ejection fraction of 25-30%.    * The right ventricular cavity size is upper limits of normal in size.  Mildly decreased right ventricular systolic function.    * Severe bi-atrial dilation.    * There is a 29mm Sapien 3 TAVR valve in aortic position that is normally  functioning.    *  There is severe tricuspid regurgitation.  Moderate pulmonary hypertension  with estimated right ventricular systolic pressure of 54 mmHg.    * There is moderate pulmonic valve regurgitation.    * There is mild to moderate mitral regurgitation.    * Elevated right atrial pressure.    * Compared to the prior study dated 08/25/2019 (personally reviewed),  tricuspid regurgitation increased. There are frequent PVC's throughout the  consult cardiology     Hyperkalemia   Repeat potassium  9 beats of V. Tach  Kayexalate      History dementia  Continue home medications  Continue follow-up management with outpatient provider(s)       Hx of CAD s/p CABG   Hx of TAVR   Hx combined systolic and diastolic heart failure  Hx hypertension  Hx of dyslipidemia  Continue home medications  Continue follow-up management with outpatient provider(s)    Nutrition: heart healthy    Code: Fc  DVT prophylaxis: heparin  GI Prophylaxis:  Disposition: pending Pt/ot eval              Signed,  Majel Homer, MD  9:19 AM 04/10/2021         This chart was generated using hospital voice-recognition software which does not employ spell-checking or grammar-checking features. It was dictated, all or in part, in a busy and often noisy patient care environment. I have taken all usual measures to dictate carefully and to review all aspects this chart. Nonetheless, given the known and well-documented performance characteristics of VR software in such patient care environments, this dictation still may contain unrecognized and wholly unintended  errors or omissions

## 2021-04-10 NOTE — Plan of Care (Signed)
Problem: Hemodynamic Status: Cardiac  Goal: Stable vital signs and fluid balance  04/10/2021 2155 by Luz Lexameron, [place] Y, RN  Flowsheets (Taken 04/10/2021 2155)  Stable vital signs and fluid balance:   Monitor/assess vital signs and telemetry per unit protocol   Assess signs and symptoms associated with cardiac rhythm changes   Monitor intake/output per unit protocol and/or LIP order   Weigh on admission and record weight daily   Monitor for leg swelling/edema and report to LIP if abnormal   Monitor lab values  04/10/2021 2152 by Luz Lexameron, [place] Y, RN  Flowsheets (Taken 04/10/2021 2152)  Stable vital signs and fluid balance:   Monitor/assess vital signs and telemetry per unit protocol   Weigh on admission and record weight daily   Assess signs and symptoms associated with cardiac rhythm changes   Monitor intake/output per unit protocol and/or LIP order   Monitor for leg swelling/edema and report to LIP if abnormal   Monitor lab values     Problem: Ineffective Gas Exchange  Goal: Effective breathing pattern  Flowsheets (Taken 04/10/2021 2152)  Effective breathing pattern:   Maintain O2 saturation level per LIP order   Monitor end tidal CO2 level per LIP order   Teach/reinforce use of ordered respiratory interventions (ie. CPAP, BiPAP, Incentive Spirometer, Acapella)   Monitor for sleep apnea   Maintain CO2 level per LIP order   Monitor for medication induced respiratory depression     Problem: Impaired Mobility  Goal: Mobility/Activity is maintained at optimal level for patient  Flowsheets (Taken 04/10/2021 2152)  Mobility/activity is maintained at optimal level for patient:   Increase mobility as tolerated/progressive mobility   Maintain proper body alignment   Plan activities to conserve energy, plan rest periods   Reposition patient every 2 hours and as needed unless able to reposition self   Assess for changes in respiratory status, level of consciousness and/or development of fatigue   Consult/collaborate with  Physical Therapy and/or Occupational Therapy   Perform active/passive ROM   Encourage independent activity per ability     Problem: Fluid and Electrolyte Imbalance/ Endocrine  Goal: Fluid and electrolyte balance are achieved/maintained  Flowsheets (Taken 04/10/2021 2155)  Fluid and electrolyte balance are achieved/maintained:   Monitor intake and output every shift   Provide adequate hydration   Assess and reassess fluid and electrolyte status   Observe for seizure activity and initiate seizure precautions if indicated   Observe for cardiac arrhythmias   Assess for confusion/personality changes   Monitor/assess lab values and report abnormal values   Monitor for muscle weakness   Monitor daily weight  Goal: Adequate hydration  Flowsheets (Taken 04/10/2021 2155)  Adequate hydration:   Assess mucus membranes, skin color, turgor, perfusion and presence of edema   Assess for peripheral, sacral, periorbital and abdominal edema   Monitor and assess vital signs and perfusion     NURSING SHIFT NOTE     Patient: Rick [name]  Day: 2      SHIFT EVENTS     Shift Narrative/Significant Events (PRN med administration, fall, RRT, etc.):     Pt lethargic at the first six hours of the shift. Pt was fully awake and eating at about 1400 when daughter in law was visiting. Telemetry called about freq PVCs. New orders received for replacing electrolytes. Safety and fall precautions remain in place. Purposeful rounding completed.          ASSESSMENT     Changes in assessment from patient's baseline this shift:  Neuro: Yes Pt was very sleepy from night time to about 1400.   CV: Yes pt having sinus brady rhythms as low as 30-40s. Multiple PVCs.   Pulm: No  Peripheral Vascular: Yes swelling in bilateral lower extremities, and sacral area.   HEENT: No  GI: No  BM during shift: No, Last BM: Last BM Date: 04/09/21  GU: Yes incontinent of urine, having a facial grimace whenever attempting to void.     Integ: No  MS: No    Pain:  None  Pain Interventions: Rest and Positioning  Medications Utilized: none    Mobility: PMP Activity: Step 6 - Walks in Room of Distance Walked (ft) (Step 6,7): 25 Feet           Lines     Patient Lines/Drains/Airways Status       Active Lines, Drains and Airways       Name Placement date Placement time Site Days    Peripheral IV 04/08/21 20 G Anterior;Left Upper Arm 04/08/21  2000  Upper Arm  2    Peripheral IV 04/08/21 20 G Right Antecubital 04/08/21  2000  Antecubital  2    External Urinary Catheter 04/09/21  1200  --  1                         VITAL SIGNS     Vitals:    04/10/21 1922   BP: 139/67   Pulse: (!) 53   Resp: 18   Temp: 97.3 F (36.3 C)   SpO2: 99%       Temp  Min: 97 F (36.1 C)  Max: 98.2 F (36.8 C)  Pulse  Min: 52  Max: 64  Resp  Min: 16  Max: 19  BP  Min: 139/67  Max: 167/91  SpO2  Min: 97 %  Max: 100 %      Intake/Output Summary (Last 24 hours) at 04/10/2021 2157  Last data filed at 04/10/2021 2106  Gross per 24 hour   Intake 120 ml   Output 2770 ml   Net -2650 ml            CARE PLAN

## 2021-04-10 NOTE — Progress Notes (Signed)
Initial Pharmacy Vancomycin Dosing Consult:  Day 1  Vancomycin Indication: Mrsa in urine  Vancomycin Monitoring Goal:     Trough 15-20 mg/L for severe infections (sepsis, meningitis, endocarditis, ect.)     Pertinent Imaging:   Only Include if it relates to the diagnosis    Pertinent Cultures:      Date Source  Organism & Pertinent Susceptibilities      2/21  Urine  (+) MRSA                  As appropriate, contact physician to consider change in therapy if cultures grow organism other than MRSA     Labs:    97.3 F (36.3 C) (Oral)   73.7 kg (162 lb 7.7 oz)  Recent Labs   Lab 04/10/21  0439 04/09/21  2109 04/09/21  1649 04/09/21  1015 04/09/21  0414 04/09/21  0110 04/08/21  1820 04/08/21  1433   WBC 6.13 7.61  --   --   --   --  3.16 3.44   Creatinine 1.5 1.6*  --   --   --   --   --  1.3   Lactic Acid 1.6 1.9 3.1* 2.2* 1.6 2.3*  --   --      Serum creatinine: 1.5 mg/dL 16/10/96 0454  Estimated creatinine clearance: 27.2 mL/min     Baseline SCr: 1.0  Look in "results review" at SCr from previous visits  Assess if in ARF (Scr change of >0.3 mg/dL in 1-2 days and/or >09% increase in SCr from baseline)  If in ARF, dose per levels (utilize PK curve in [employer] to determine timing of random level to assess clearance)    Nephrotoxic drugs administered in the past 48 hours: ACE/ARB, IV contrast    Assessment    IV vanco for MRSA in urine  AKI    Recommendations:      Vanco 1250mg  IV x 1, check 24 hr random level (2/24 @2000 )

## 2021-04-10 NOTE — OT Eval Note (Addendum)
Occupational Therapy Eval and Treatment Rick [name]        Post Acute Care Therapy Recommendations   Discharge Recommendations:  Home with supervision, Home with home health OT, Home with home health PT (A w/ OOB ADL/Mobility) 24/7 supervision for safety.     If recommended discharge disposition is not available, patient will require the following assistance: SNF    DME needs IF patient is discharging home: Front wheel walker (possible shower chair / tub transfer bench pending d/c destination)    Therapy discharge recommendations may change with patient status.  Please refer to most recent note for up-to-date recommendations.    Is PT evaluation indicated at this time? This patient is already on PT caseload.    Unit: [facility]  Bed: A2521/A2521-B      ___________________________________________________    Time of Evaluation and Treatment:  Time Calculation  OT Received On: 04/10/21  Start Time: 1338  Stop Time: 1402  Time Calculation (min): 24 min    Chart Review and Collaboration with Care Team: 5 minutes, not included in above time.    Evaluation: 8 minutes  Treatment: 16 minutes    OT Visit Number: 1    Consult received for Rick [name] for OT Evaluation and Treatment.  Patient's medical condition is appropriate for Occupational therapy intervention at this time.    Activity Orders:  OT eval and treat    Precautions and Contraindications:  Precautions  Weight Bearing Status: no restrictions  Fall Risks: High, Impaired balance/gait, Impaired mobility, Muscle weakness  Other Precautions: bleeding, falls, urinary incontinence    Personal Protective Equipment (PPE)  gloves and procedure mask    Medical Diagnosis:  Acute pulmonary embolism with acute cor pulmonale, unspecified pulmonary embolism type [I26.09]  Pulmonary embolism with acute cor pulmonale, unspecified chronicity, unspecified pulmonary embolism type [I26.09]    History of Present Illness:  Rick [name] is a 88 y.o. male admitted on 04/08/2021 with a history of CAD status post CABG 2003 while living in [place], most recent [facility] 06/2019 with patent LIMA to LAD, patent SVG to OM1, patent SVG to OM2, patent SVG to RCA, status post TAVR 06/2019, mixed ischemic/nonischemic cardiomyopathy with EF 30%, chronic HFrEF, hypertension, dyslipidemia who presented to the hospital on 04/08/2021 with shortness of breath.    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    Dry cough    Aortic valve stenosis, etiology of cardiac valve disease unspecified    S/P TAVR (transcatheter aortic valve replacement)    Difficulty in walking, not elsewhere classified    Dementia without behavioral disturbance, unspecified dementia type    Balance disorder    Chronic HFrEF (heart failure with reduced ejection fraction)    Acute pulmonary embolism with acute cor pulmonale, unspecified pulmonary embolism type    Pulmonary embolism with acute cor pulmonale, unspecified chronicity, unspecified pulmonary embolism type        Past Medical/Surgical History:  Past Medical History:   Diagnosis Date    Atrial enlargement, bilateral Echo result 04/04/19    CAD (coronary artery disease) 05/28/19 Cath results.    Severe 3V CAD.  Patent LIMA to LAD, patent SVG to OM 1 and  OM 2 of circumflex CA, & patent SVG to RCA.    Chronic combined systolic and diastolic heart failure 310/21 OV Dr. Penny Pia    EF=30%    Dementia 04/2014    Difficulty walking 06/2016    Post surgery of lt knee, rt femur    Dyslipidemia 310/21 OV Dr. Penny Pia    Dyspnea on exertion 02/2018    HTN (hypertension) 310/21 OV Dr. Penny Pia    Mild cognitive impairment 310/21 OV Dr. Penny Pia    Primary cardiomyopathy 11/2017    RBBB  07/10/19 EKg result    S/P CABG (coronary artery bypass graft)      LIMA to LAD,  SVG to OM 1 & OM 2 of circumflex CA, & SVG to RCA.     Past Surgical History:   Procedure Laterality Date    CORONARY ARTERY BYPASS GRAFT  2003     approx year: LIMA to LAD,  SVG to OM 1 & OM 2 of circumflex CA, & SVG to RCA    FRACTURE SURGERY Right 06/2016    femur    PERCUTANEOUS VALVULOPLASTY - AORTIC N/A 07/12/2019    Procedure: TAVR #2;  Surgeon: Arlyss Queen, MD PhD;  Location: [facility];  Service: Cardiovascular;  Laterality: N/A;  23 HR OB    REPLACEMENT, AORTIC VALVE , EDWARDS SAPIEN N/A 07/12/2019    Procedure: REPLACEMENT, AORTIC VALVE , EDWARDS SAPIEN -- TAVR Valve:   S3 29mm -- Access: RTF -- IC: Dr. Penny Pia;  Surgeon: Christene Lye, MD;  Location: [facility] HEART OR;  Service: Cardiothoracic;  Laterality: N/A;    RIGHT & LEFT HEART CATH POSSIBLE PCI N/A 05/26/2019    Procedure: RIGHT & LEFT HEART CATH POSSIBLE PCI;  Surgeon: Arlyss Queen, MD PhD;  Location: [facility];  Service: Cardiovascular;  Laterality: N/A;  [id number] scheduled with Maira in dr's office. OP. kr    TAVR PERCUTANEOUS FEMORAL  07/13/2019         TOTAL KNEE ARTHROPLASTY Left 2017    TOTAL KNEE ARTHROPLASTY Right 2005        X-Rays/Tests/Labs  Lab Results   Component Value Date/Time    HGB 12.8 04/10/2021 04:39 AM    HCT 40.0 04/10/2021 04:39 AM    K 5.7 (H) 04/10/2021 04:39 AM    NA 142 04/10/2021 04:39 AM    INR 1.2 (H) 07/10/2019 02:22 PM    TROPI 26.2 04/09/2021 01:10 AM    TROPI 33.2 04/08/2021 02:33 PM       All imaging reviewed. 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 Nursing clears patient for therapy.   Patient Goal: None stated  Pain Assessment  Pain Assessment:  (Reports stiff B  knees; no pain)      Objective:   Inspection/Posture  Inspection/Posture: pt received supine in bed, ext cath  Observation of Patient/Vital Signs: BP 164/81   Pulse (!) 52   Temp 97.5 F (36.4 C) (Oral)   Resp 16   Ht 1.676 m (5\' 6" )   Wt 73.7 kg (162 lb 7.7 oz)   SpO2 98%   BMI 26.22 kg/m     Cognitive Status and Neuro Exam:  Cognition/Neuro Status  Arousal/Alertness: Appropriate responses to stimuli  Attention Span: Appears intact  Orientation Level: Oriented to person, Disoriented to place, Disoriented to time, Disoriented to situation  Memory: Decreased short term memory, Decreased recall of recent events, Decreased recall of biographical information  Following Commands: minimal verbal instruction  Safety Awareness: moderate verbal instruction  Insights: Not aware of deficits, Educated in safety awareness  Problem Solving: Assistance required to identify errors made, Assistance required to generate solutions, Assistance required to implement solutions  Behavior: calm, cooperative  Motor Planning: intact  Coordination: GMC impaired    Musculoskeletal Examination  Gross ROM  Right Upper Extremity ROM: within functional limits  Left Upper Extremity ROM: within functional limits  Gross Strength  Right Upper Extremity Strength: 4/5  Left Upper Extremity Strength: 4/5       Sensory/Oculomotor Examination  Sensory  Auditory: intact  Visual Acuity:  (Reports wears glasses "sometimes")         Activities  of Daily Living  Self-care and Home Management  LB Dressing: Dependent, sitting, Don/doff L sock, Don/doff R sock (Reports assist at home)  Toileting:  (Incontinent)  Functional Transfers: Contact Guard Assist, toilet transfer    Functional Mobility:  Mobility and Transfers  Scooting to EOB: Stand by Assist  Supine to Sit: Stand by Assist  Sit to Supine: Stand by Assist  Sit to Stand: Armed forces logistics/support/administrative officer Mobility/Ambulation: Risk analyst (Min x1 2/2 LOB in doorframe of bathroom)  Functional Mobility Deferred (Comment): Gait belt used for patient and Database administrator Sitting Balance: Supervision  Dyanamic Sitting Balance: Scientific laboratory technician Standing Balance: Contact Guard Assist  Dynamic Standing Balance: Contact Guard Assist    Participation and Activity Tolerance  Participation and Endurance  Participation Effort: good  Endurance: Endurance does not limit participation in activity    Educated the Patient to role of occupational therapy, plan of care, goals of therapy and safety with mobility and ADLs with limited indication of understanding.  Additional education required..    Patient left in bed with alarm and all other medical equipment in place and call bell and all personal items/needs within reach.  RN notified of session outcome.        Assessment:  Rick [name] is a 88 y.o. male admitted 04/08/2021. OT Assessment: decreased strength;balance deficits;decreased independence with ADLs;decreased safety awareness;decreased attention;decreased independence with IADLs;decreased cognition      Treatment:  - Received lying supine in bed, resting. Patient agreeable to participate in therapy, RN clears for therapy.   - SBA to CGA for bed mobility, difficulty initially reports stiffness to B knees.   - Sitting EOB dependent for donning socks. Pt reports "dad" assists with dressing at home then later reports son.  - CGA for sit>stand, pt incontinent  upon standing, pt states "I don't think I can make it to the bathroom."   - Functional ambulation to the bathroom x CGA w/ FWW. Bathroom back to bed Min  A w/ FWW x1 due to LOB at doorframe.   - Post ambulation therapist dependently removing socks and cleaning feet / legs due to soiled with urine.   - Notified RN of need for external catheter change as it did not appear to be functionining properly, chucks had been changed to dry and continued to soil with urine. Notified for skin integrity protection.    Rehabilitation Potential: Prognosis: Good, With continued OT s/p acute discharge, With family    Plan:  OT Frequency Recommended: 1-2x/wk   Treatment Interventions: ADL retraining, Functional transfer training, UE strengthening/ROM, Endurance training, Patient/Family training, Equipment eval/education     PMP Activity: Step 6 - Walks in Room  Distance Walked (ft) (Step 6,7): 25 Feet    Risks/benefits/POC discussed    Goals:  Time For Goal Achievement: by time of discharge  ADL Goals  Patient will toilet: Supervision, by time of discharge, Not met  Mobility and Transfer Goals  Pt will transfer bed to toilet: Supervision, with rolling walker, by time of discharge, Not met                           Gordy Councilman Trinna Post) Jeanella Craze, MSOT, OTR/L, CSRS  LSVT-BIG Certified, Sharia Reeve  Judie Petit [phone]; T/W [phone]; Th/F 0630-1600  Ext. [id number]  04/10/2021 4:08 PM    [facility]  Patient: Rick [name] MRN#: [id number]   Unit: [facility] Bed: T5573/[id number]

## 2021-04-10 NOTE — Progress Notes (Signed)
[facility] PROGRESS NOTE  Date:  04/10/2021  1:35 PM    Primary Cardiologist: Betti Cruz, MD  Case Discussed with [facility] Attending:  Maurine Cane, MD    Provider Completing Note:  Jinny Blossom, PA-C  [facility])  [facility] [phone] (7am-7pm)  OFFICE Number [phone] (7pm-7am)  MD LINE [phone]    Patient:  Rick Lowden [facility].  DOB: 07-26-1924.  male  Date of Admission:  04/08/2021    Attending:  Majel Homer, MD     _____________________________________________    Dr. Maurine Cane --  I performed the substantive portion of this visit by personally conducting the Medical Decision-Making component, in its entirety. Patient was also seen by APP , Jinny Blossom, for the history and/or examination portion of the visit. I reviewed and updated the documented findings and plan of care accordingly as noted       88 yo male with complex history, CAD, CABG, TAVR, EF 30%, cognitive impairment, p/w SOB, noted to have PE. Reasonable amount of ventricular ectopy, started on BB, limited by bradycardia at time, so low dose has been selected. Has been transitioned  by medicine from IV heparin to Eliquis for PE.  BP running high.    Exam: BP (!) 167/91   Pulse (!) 57   Temp 97 F (36.1 C) (Axillary)   Resp 16   Ht 1.676 m (5\' 6" )   Wt 73.7 kg (162 lb 7.7 oz)   SpO2 99%   BMI 26.22 kg/m     Jvp nrml  Clear  lungs  Irregular (ectopy), no sign murmurs  Abd soft and nt  No le edema  Cognitive impairment    TEle carefully and indep reviewed    MDM/Impression: 88 yo male with complex history here with  PE now on Eliquis.  Known CMP, EF 30% in setting of ischemic CMP and TAVR  (AS history).  On metoprolol (low dose), Entresto BID, and Crestor and ASA.  May wish to advance Entresto dose in near future, esp if BP allows and renal function nonlimiting.    We will sign off.  Pt can f/u with Dr. Bari Edward or Dr.Saleh post discharge.      ASSESSMENT/PLAN:     88  y.o. male with a history of CAD status post CABG 2003 while living in [place], most recent [facility] 06/2019 with patent LIMA to LAD, patent SVG to OM1, patent SVG to OM2, patent SVG to RCA, status post TAVR 06/2019, mixed ischemic/nonischemic cardiomyopathy with EF 30%, chronic HFrEF, hypertension, dyslipidemia who presented to the hospital on 04/08/2021 with shortness of breath.     #Acute pulmonary embolism- IV heparin transitioned to Eliquis     #Acute on chronic HFrEF-at baseline his activity is fairly limited by balance, uses a cane, is not chronically on a diuretic at home.  Does not typically complain of shortness of breath with his normal light activity.  Presented with some degree of volume overload.  proBNP elevated.  Was started on IV Lasix dosing on admission.  Today, on room air, ambulating with PT/OT, denies SOB, lungs are clear.  -Volume management: will change to IV Lasix to Lasix 40 mg po Qday  -GDMT: Not chronically on a beta-blocker due to history of bradycardia.  Yesterday, started low-dose beta-blocker due to PVCs as below (will monitor for bradycardia).  Continue Entresto but will change to his normal home dose of half a tablet twice daily due to history of symptomatic hypotension.  -Strict I's  and O's, standing daily weights, fluid and sodium restriction  -K of 5.7 noted.  Received IV lasix today.  Reassess BMP in AM     #Mixed ischemic/nonischemic cardiomyopathy-history of ejection fraction 30% in 2021, felt to be mixed etiology.  Echocardiogram this admission ejection fraction 20-30%.  -GDMT as above     #CAD-history of CABG in [place] in 2003.  Prior to TAVR in 2021 left heart catheterization showed patent bypass grafts as detailed.  He is having no anginal symptoms.  High sensitive troponin is normal.  No ischemic changes on EKG.   -Continue Crestor  -Not on aspirin.  On full anticoagulation for his pulm embolism     #Aortic stenosis, status post TAVR-echocardiogram this admission  with normally functioning TAVR.     #Ventricular ectopy-frequent PVCs on telemetry, up to approximately 20/minute on admission.  Some brief runs of NSVT without symptoms.  In the setting of his cardiomyopathy would like to minimize PVC burden.  Yesterday started Lopressor 12.5 mg twice daily.  Ventricular ectopy a bit less today, though on BB does have some SB upper 40's to 50's (60's -70's currently, walking with PT/OT).   - cont low dose BB - if significant brady, will [place].  IF HR's tolerate, will plan on [place] home on low dose Toprol for GDMT and PVC's.             SUBJECTIVE:     Patient ambulating with PT/OT currently.  Has remain breathing on room air.  Denies SOB.  No CP or LH.       REVIEW OF SYSTEMS:                                              ARROS, ROSSSSS     All other systems were reviewed and were negative except as noted in HPI    CARDIAC DIAGNOSTICS:                                         [id number]     Telemetry:  (I independently reviewed the telemetry data) sinus mostly 50's to 60's, some upper 40's (in bed).  Occ to frequent PVC's, mostly isolated and couplets, approx 10's / minute range today on graphic trend.  Occ PAC's, rare nonconducted PACs.     ECG 04/08/2021:  (I independently reviewed the ECG tracing) sinus rhythm, 62 bpm, RBBB, LAFB, occasional isolated PVC     ECG 11/15/2020: Sinus bradycardia, 51 bpm, RBBB, occasional PVCs     Chest Xray 04/08/2021: No acute pulmonary infiltrates demonstrated     TTE 04/09/21:  Summary    * The left ventricular cavity size is dilated.  Left ventricular wall  thickness is normal.  Left ventricular systolic function is severely decreased  with an estimated ejection fraction of 25-30%.    * The right ventricular cavity size is upper limits of normal in size.  Mildly decreased right ventricular systolic function.    * Severe bi-atrial dilation.    * There is a 29mm Sapien 3 TAVR valve in aortic position that is normally  functioning.    * There is severe tricuspid  regurgitation.  Moderate pulmonary hypertension  with estimated right ventricular systolic pressure of 54 mmHg.    * There  is moderate pulmonic valve regurgitation.    * There is mild to moderate mitral regurgitation.    * Elevated right atrial pressure.    * Compared to the prior study dated 08/25/2019 (personally reviewed),  tricuspid regurgitation increased. There are frequent PVC's throughout the  length of this study. Otherwise, no significant changes.     TTE 08/25/2019:  Summary    * The left ventricle is mildly dilated.    * Left ventricular systolic function is severely decreased with an ejection  fraction by Biplane Method of Discs of  30 %.    * Left ventricular wall thickness is mildly increased.    * LVOT VTI is low consistent with low stroke volume (58mL) and cardiac  output (3.3L/min).  Stroke volume index 30mL/m2.    * The right ventricular cavity size is mildly dilated.    * Decreased right ventricular systolic function.  RV FAC 11.2%, RV S' 6.6  cm/s.  TAPSE 16mm.    * There is a known 29mm Sapien 3 TAVR valve in aortic position. It appears  well seated. DVI 0.3 and EOA 1.3cm2. These values are derived from a low  stroke volume (low LVOT VTI) and may reflect an underestimation of valve area  due to low output state.    * There is mild mitral regurgitation.    * There is mild to moderate tricuspid regurgitation.    * Moderate pulmonary hypertension with estimated right ventricular systolic  pressure of  46 mmHg.    * The ascending aorta is dilated.    * Compared to the prior study, Jul 13 2019, TAVR hemodynamics have changed.  Change in DVI and EOA may be underestimated and due to low output state.        LHC 05/2019 (pre TAVR):  Severe three-vessel coronary artery disease.   Patent LIMA to the LAD, patent vein graft to obtuse marginal 1 and obtuse marginal 2 of the circumflex coronary artery, and patent vein graft to the right coronary artery.    Allergies:   No Known Allergies    MEDICATIONS:     INFUSION MEDS:      SCHEDULED MEDS:     Current Facility-Administered Medications   Medication Dose Route Frequency    apixaban  10 mg Oral Q12H SCH    Followed by    Melene Muller ON 04/16/2021] apixaban  5 mg Oral Q12H SCH    docusate sodium  100 mg Oral BID    furosemide  40 mg Intravenous BID    memantine  10 mg Oral BID    metoprolol tartrate  12.5 mg Oral Q12H SCH    rosuvastatin  10 mg Oral QHS    sacubitril-valsartan  0.5 tablet Oral BID          OBJECTIVE/PHYSICAL EXAM:                                                        ARPE3, ARPE2, PEEEEE   BP 164/81   Pulse (!) 52   Temp 97.5 F (36.4 C) (Oral)   Resp 16   Ht 1.676 m (5\' 6" )   Wt 73.7 kg (162 lb 7.7 oz)   SpO2 98%   BMI 26.22 kg/m    Intake/Output Summary (Last 24 hours) at 04/10/2021 1335  Last data  filed at 04/10/2021 0600  Gross per 24 hour   Intake 120 ml   Output 1620 ml   Net -1500 ml       General Appearance:  Breathing comfortable, no acute distress  Head:  normocephalic  Eyes:  EOM's intact, nonicteric sclera  Neck:  No carotid bruit or jugular venous distension.  No HJR.   Lungs:  Clear to auscultation throughout, no wheezes, rhonchi or rales, good respiratory effort, symmetric chest expansion  Cardiac:  RRR, normal S1, S2, no murmur, no rub, S3 or S4  Abdomen:  Soft, non-tender, no rebound or guarding, non-distended, positive bowel sounds  Extremities:  No cyanosis or clubbing. No arthritis or deformities. No LE edema  Vascular: 2+ radial and distal pulses bilaterally  Neurologic:  Alert and oriented x3, mood and affect normal    LABORATORY:     CBC w/Diff   Recent Labs   Lab 04/10/21  0439 04/09/21  2109 04/08/21  1820   WBC 6.13 7.61 3.16   Hgb 12.8 12.3* 12.0*   Hematocrit 40.0 38.5 36.9*   Platelets 94* 93* 84*        Basic Metabolic Profile   Recent Labs   Lab 04/10/21  0439 04/09/21  2109 04/08/21  1433   Sodium 142 144 147*   Potassium 5.7* 4.3 4.5   Chloride 106 107 114*   CO2 26 30* 24   BUN 31.0* 28.0 26.0   Creatinine 1.5 1.6*  1.3   EGFR 52.7 48.9 >60.0   Glucose 98 95 119*   Calcium 9.6 9.4 9.2        Cardiac Enzymes   Recent Labs   Lab 04/09/21  0110 04/08/21  1433   hs Troponin-I 26.2 33.2     Recent Labs   Lab 04/10/21  0439 04/09/21  1015 04/09/21  0110   NT-proBNP 4,533* 5,311* 4,806*        D-dimer   Recent Labs   Lab 04/08/21  1433   D-Dimer 2.05*        Thyroid Studies         Invalid input(s): FREET4     Cholesterol Panel          Coagulation Studies   Recent Labs   Lab 04/08/21  1820   PTT 36

## 2021-04-11 ENCOUNTER — Telehealth (INDEPENDENT_AMBULATORY_CARE_PROVIDER_SITE_OTHER): Payer: Medicare Other | Admitting: Student in an Organized Health Care Education/Training Program

## 2021-04-11 DIAGNOSIS — I252 Old myocardial infarction: Secondary | ICD-10-CM

## 2021-04-11 DIAGNOSIS — I2609 Other pulmonary embolism with acute cor pulmonale: Principal | ICD-10-CM

## 2021-04-11 DIAGNOSIS — I2510 Atherosclerotic heart disease of native coronary artery without angina pectoris: Secondary | ICD-10-CM

## 2021-04-11 DIAGNOSIS — I5022 Chronic systolic (congestive) heart failure: Secondary | ICD-10-CM

## 2021-04-11 DIAGNOSIS — I350 Nonrheumatic aortic (valve) stenosis: Secondary | ICD-10-CM

## 2021-04-11 LAB — GLUCOSE WHOLE BLOOD - POCT
Whole Blood Glucose POCT: 80 mg/dL (ref 70–100)
Whole Blood Glucose POCT: 91 mg/dL (ref 70–100)

## 2021-04-11 LAB — BASIC METABOLIC PANEL
Anion Gap: 13 (ref 5.0–15.0)
BUN: 27 mg/dL (ref 9.0–28.0)
CO2: 24 mEq/L (ref 17–29)
Calcium: 9.3 mg/dL (ref 7.9–10.2)
Chloride: 104 mEq/L (ref 99–111)
Creatinine: 1.2 mg/dL (ref 0.5–1.5)
Glucose: 77 mg/dL (ref 70–100)
Potassium: 4.9 mEq/L (ref 3.5–5.3)
Sodium: 141 mEq/L (ref 135–145)

## 2021-04-11 LAB — MAGNESIUM: Magnesium: 1.9 mg/dL (ref 1.6–2.6)

## 2021-04-11 LAB — GFR: EGFR: >60

## 2021-04-11 MED ORDER — VANCOMYCIN PHARMACY TO DOSE PLACEHOLDER
INTRAVENOUS | Status: DC
Start: 2021-04-11 — End: 2021-04-11

## 2021-04-11 MED ORDER — FUROSEMIDE 40 MG PO TABS
40.0000 mg | ORAL_TABLET | Freq: Every day | ORAL | 0 refills | Status: DC
Start: 2021-04-12 — End: 2021-05-02

## 2021-04-11 MED ORDER — FUROSEMIDE 40 MG PO TABS
40.0000 mg | ORAL_TABLET | Freq: Every day | ORAL | 0 refills | Status: DC
Start: 2021-04-12 — End: 2021-04-11

## 2021-04-11 MED ORDER — SACUBITRIL-VALSARTAN 24-26 MG PO TABS
1.0000 | ORAL_TABLET | Freq: Two times a day (BID) | ORAL | 0 refills | Status: DC
Start: 2021-04-11 — End: 2021-05-20

## 2021-04-11 MED ORDER — SACUBITRIL-VALSARTAN 24-26 MG PO TABS
1.0000 | ORAL_TABLET | Freq: Two times a day (BID) | ORAL | Status: DC
Start: 2021-04-11 — End: 2021-04-11

## 2021-04-11 MED ORDER — DOXYCYCLINE HYCLATE 100 MG PO TABS
100.0000 mg | ORAL_TABLET | Freq: Two times a day (BID) | ORAL | 0 refills | Status: DC
Start: 2021-04-11 — End: 2021-04-16

## 2021-04-11 MED ORDER — APIXABAN 5 MG PO TABS
10.0000 mg | ORAL_TABLET | Freq: Two times a day (BID) | ORAL | 0 refills | Status: DC
Start: 2021-04-11 — End: 2021-04-16

## 2021-04-11 MED ORDER — APIXABAN 5 MG PO TABS
5.0000 mg | ORAL_TABLET | Freq: Two times a day (BID) | ORAL | 0 refills | Status: DC
Start: 2021-04-16 — End: 2021-05-20

## 2021-04-11 MED ORDER — APIXABAN 5 MG PO TABS
10.0000 mg | ORAL_TABLET | Freq: Two times a day (BID) | ORAL | 0 refills | Status: DC
Start: 2021-04-11 — End: 2021-04-11

## 2021-04-11 MED ORDER — DOXYCYCLINE HYCLATE 100 MG PO TABS
100.0000 mg | ORAL_TABLET | Freq: Two times a day (BID) | ORAL | 0 refills | Status: DC
Start: 2021-04-11 — End: 2021-04-11

## 2021-04-11 MED ORDER — APIXABAN 5 MG PO TABS
5.0000 mg | ORAL_TABLET | Freq: Two times a day (BID) | ORAL | 0 refills | Status: DC
Start: 2021-04-16 — End: 2021-04-11

## 2021-04-11 NOTE — Plan of Care (Signed)
NURSING SHIFT NOTE     Patient: Rick [name]  Day: 3      SHIFT EVENTS     Shift Narrative/Significant Events (PRN med administration, fall, RRT, etc.):     Patient alert and oriented x3 requiring occasional reorientation. Patient urine culture positive for MRSA. Contact isolation initiated and IV Vancomycin administered.  Safety and fall precautions remain in place. Purposeful rounding completed.          ASSESSMENT     Changes in assessment from patient's baseline this shift:    Neuro: No  CV: No  Pulm: No  Peripheral Vascular: No  HEENT: No  GI: No  BM during shift: No, Last BM: Last BM Date: 04/09/21  GU: No   Integ: No  MS: No    Pain: None  Pain Interventions: None  Medications Utilized: None    Mobility: PMP Activity: Step 3 - Bed Mobility of Distance Walked (ft) (Step 6,7): 25 Feet           Lines     Patient Lines/Drains/Airways Status       Active Lines, Drains and Airways       Name Placement date Placement time Site Days    Peripheral IV 04/08/21 20 G Anterior;Left Upper Arm 04/08/21  2000  Upper Arm  2    Peripheral IV 04/08/21 20 G Right Antecubital 04/08/21  2000  Antecubital  2    External Urinary Catheter 04/09/21  1200  --  1                         VITAL SIGNS     Vitals:    04/10/21 2313   BP: 123/63   Pulse: (!) 48   Resp: 18   Temp: 97.3 F (36.3 C)   SpO2: 100%       Temp  Min: 97 F (36.1 C)  Max: 97.9 F (36.6 C)  Pulse  Min: 48  Max: 64  Resp  Min: 16  Max: 19  BP  Min: 123/63  Max: 167/91  SpO2  Min: 97 %  Max: 100 %      Intake/Output Summary (Last 24 hours) at 04/11/2021 0344  Last data filed at 04/10/2021 2106  Gross per 24 hour   Intake --   Output 1870 ml   Net -1870 ml                CARE PLAN         Problem: Moderate/High Fall Risk Score >5  Goal: Patient will remain free of falls  Outcome: Progressing  Flowsheets (Taken 04/10/2021 2000)  High (Greater than 13):   HIGH-Visual cue at entrance to patient's room   HIGH-Bed alarm on at all times while patient in bed    HIGH-Utilize chair pad alarm for patient while in the chair   HIGH-Apply yellow "Fall Risk" arm band   HIGH-Initiate use of floor mats as appropriate   HIGH-Consider use of low bed     Problem: Compromised Hemodynamic Status  Goal: Vital signs and fluid balance maintained/improved  Outcome: Progressing  Flowsheets (Taken 04/11/2021 0342)  Vital signs and fluid balance are maintained/improved: Monitor/assess vitals and hemodynamic parameters with position changes     Problem: Bleeding Precautions  Goal: Free from bleeding  Outcome: Progressing  Flowsheets (Taken 04/09/2021 0153 by Lanna Poche, RN)  Free from bleeding:   Monitor/assess site of invasive procedure for signs of bleeding  Report signs of bleeding     Problem: Hemodynamic Status: Cardiac  Goal: Stable vital signs and fluid balance  Outcome: Progressing  Flowsheets (Taken 04/10/2021 2155 by Luz Lex, RN)  Stable vital signs and fluid balance:   Monitor/assess vital signs and telemetry per unit protocol   Assess signs and symptoms associated with cardiac rhythm changes   Monitor intake/output per unit protocol and/or LIP order   Weigh on admission and record weight daily   Monitor for leg swelling/edema and report to LIP if abnormal   Monitor lab values     Problem: Ineffective Gas Exchange  Goal: Effective breathing pattern  Outcome: Progressing  Flowsheets (Taken 04/10/2021 2152 by Luz Lex, RN)  Effective breathing pattern:   Maintain O2 saturation level per LIP order   Monitor end tidal CO2 level per LIP order   Teach/reinforce use of ordered respiratory interventions (ie. CPAP, BiPAP, Incentive Spirometer, Acapella)   Monitor for sleep apnea   Maintain CO2 level per LIP order   Monitor for medication induced respiratory depression     Problem: Impaired Mobility  Goal: Mobility/Activity is maintained at optimal level for patient  Outcome: Progressing  Flowsheets (Taken 04/10/2021 2152 by Luz Lex, RN)  Mobility/activity is  maintained at optimal level for patient:   Increase mobility as tolerated/progressive mobility   Maintain proper body alignment   Plan activities to conserve energy, plan rest periods   Reposition patient every 2 hours and as needed unless able to reposition self   Assess for changes in respiratory status, level of consciousness and/or development of fatigue   Consult/collaborate with Physical Therapy and/or Occupational Therapy   Perform active/passive ROM   Encourage independent activity per ability     Problem: Fluid and Electrolyte Imbalance/ Endocrine  Goal: Fluid and electrolyte balance are achieved/maintained  Outcome: Progressing  Flowsheets (Taken 04/10/2021 2155 by Luz Lex, RN)  Fluid and electrolyte balance are achieved/maintained:   Monitor intake and output every shift   Provide adequate hydration   Assess and reassess fluid and electrolyte status   Observe for seizure activity and initiate seizure precautions if indicated   Observe for cardiac arrhythmias   Assess for confusion/personality changes   Monitor/assess lab values and report abnormal values   Monitor for muscle weakness   Monitor daily weight  Goal: Adequate hydration  Outcome: Progressing  Flowsheets (Taken 04/10/2021 2155 by Luz Lex, RN)  Adequate hydration:   Assess mucus membranes, skin color, turgor, perfusion and presence of edema   Assess for peripheral, sacral, periorbital and abdominal edema   Monitor and assess vital signs and perfusion

## 2021-04-11 NOTE — Plan of Care (Signed)
Discharge order verified, pt ID verified    Pt cleared for discharge. RN provided discharge instructions along with information regarding HF, PE, medication regimen, and f/u appointments to the pt's son and daughter in law at the bedside. Parties verbalized understanding of the preceding information. All questions and concerns were addressed. Prescriptions sent to pt's pharmacy on file. PIV removed. Telemetry removed, cleaned, and returned to unit supply and logged. Telemetry communication order discontinued per protocol. Pt will be transported off unit with all belongings via wheelchair.     NAD noted, no complaints voiced.    PACC set up home PT/OT.     VSSBP 122/61   Pulse (!) 47   Temp 97.7 F (36.5 C) (Axillary)   Resp 15   Ht 1.676 m (5\' 6" )   Wt 73 kg (161 lb)   SpO2 98%   BMI 25.99 kg/m

## 2021-04-11 NOTE — Progress Notes (Signed)
[facility]  PROGRESS NOTE      Patient: Rick [name]  Date: 04/11/2021   LOS: 3 Days  Admission Date: 04/08/2021   MRN: [id number]  Attending: Majel Homer, MD  Please contact me on Epic secure chat            SUBJECTIVE     Patient seen and examined at bedside with RN.  Denies fever, chills, chest pain, shortness of breath, abdominal pain, nausea, vomiting, diarrhea.        OBJECTIVE     Vitals:    04/11/21 0848   BP: 146/77   Pulse:    Resp:    Temp:    SpO2:        Temperature: Temp  Min: 97 F (36.1 C)  Max: 97.6 F (36.4 C)  Pulse: Pulse  Min: 48  Max: 63  Respiratory: Resp  Min: 15  Max: 18  Non-Invasive BP: BP  Min: 107/68  Max: 167/91  Pulse Oximetry SpO2  Min: 96 %  Max: 100 %    Intake and Output Summary (Last 24 hours) at Date Time    Intake/Output Summary (Last 24 hours) at 04/11/2021 0909  Last data filed at 04/11/2021 0418  Gross per 24 hour   Intake --   Output 2550 ml   Net -2550 ml              PHYSICAL EXAMINATION  GEN APPEARANCE: NAD.  Pleasant, alert and cooperative. Comfortable.   HEENT: NCAT, EOMI, PERRL, no nasal discharge.   conjunctivae/corneas clear. Oral mucosa moist.   Neck: Supple without meningismus, no JVD   CVS: RRR, S1, S2. No murmur, click, rub, or gallop   LUNGS: CTAB; No wheezes, crackles, rhonchi or rales  ABD: BS+, soft, NT, ND, no guarding or rigidity  EXT: No edema; Pulses 2+ and intact  SKIN: skin is warm and dry. No rash noted.  MSK: full ROM intact. No joint tenderness or swelling.   NEURO: AAO, CN 2-12 intact.  No focal neurological deficits  MENTAL STATUS: appropriate affect and mood, pleasantly confused         MEDICATIONS     Current Facility-Administered Medications   Medication Dose Route Frequency    apixaban  10 mg Oral Q12H SCH    Followed by    Melene Muller ON 04/16/2021] apixaban  5 mg Oral Q12H SCH    docusate sodium  100 mg Oral BID    furosemide  40 mg Oral Daily    memantine  10 mg Oral BID    rosuvastatin  10 mg Oral QHS    sacubitril-valsartan   0.5 tablet Oral BID    vancomycin   Intravenous See Admin Instructions            LABS/IMAGING     Recent Labs   Lab 04/10/21  0439 04/09/21  2109 04/08/21  1820   WBC 6.13 7.61 3.16   RBC 3.79* 3.63* 3.54*   Hgb 12.8 12.3* 12.0*   Hematocrit 40.0 38.5 36.9*   MCV 105.5* 106.1* 104.2*   Platelets 94* 93* 84*         Recent Labs   Lab 04/11/21  0414 04/10/21  0439 04/09/21  2109 04/08/21  1433   Sodium 141 142 144 147*   Potassium 4.9 5.7* 4.3 4.5   Chloride 104 106 107 114*   CO2 24 26 30* 24   BUN 27.0 31.0* 28.0 26.0   Creatinine 1.2 1.5 1.6* 1.3  Glucose 77 98 95 119*   Calcium 9.3 9.6 9.4 9.2   Magnesium 1.9 1.9  --   --          Recent Labs   Lab 04/08/21  1433   ALT 22   AST (SGOT) 26   Bilirubin, Total 0.7   Albumin 3.2*   Alkaline Phosphatase 69         Recent Labs   Lab 04/09/21  0110 04/08/21  1433   hs Troponin-I 26.2 33.2         Recent Labs   Lab 04/08/21  1820   PTT 36         Microbiology Results (last 15 days)       Procedure Component Value Units Date/Time    Urine culture [[id number]] Collected: 04/08/21 1537    Order Status: Completed Specimen: Bladder Updated: 04/10/21 2006    Narrative:      ORDER#: [id number]                                    ORDERED BY: REDDEN, LUKE  SOURCE: Urine                                        COLLECTED:  04/08/21 15:37  ANTIBIOTICS AT COLL.:                                RECEIVED :  04/09/21 05:16  Culture Urine                              FINAL       04/10/21 20:06   +  04/10/21   10,000 - 30,000 CFU/ML of normal urogenital or skin microbiota, no             further work  04/10/21   50,000 - 70,000 CFU/ML Methicillin Resistant Staph aureus      _____________________________________________________________________________                                      MRSA        ANTIBIOTICS                     MIC  INTRP      _____________________________________________________________________________  Daptomycin                      <=1    S        Gentamicin                       <=2    S        Levofloxacin                    >4     R        Linezolid                       <=1    S        Nitrofurantoin                 <=  16    S  D1    Oxacillin                       >2     R  D2    Penicillin                      >1     R        Rifampin                       <=0.5   S        Tetracycline                   <=0.5   S        Trimethoprim/Sulfamethoxazole  >2/38   R  D3    Vancomycin                     <=0.5   S          -----DRUG COMMENTS----------    D1:  Nitrofurantoin should only be used for the treatment of         uncomplicated cystitis.         [place] [facility] Antimicrobial Subcommittee June 2015    D2:  Oxacillin sensitivity predicts sensitivity to cephalosporins.         [place] Antimicrobial Subcommittee 2016    D3:  This test method may generate false resistant results for         S. aureus with Trimethoprim sulfamethoxazole. If Trimethoprim         sulfamethoxazole is Resistant but is the preferred antibiotic,         please contact the laboratory for confirmatory testing.  _____________________________________________________________________________            S=SUSCEPTIBLE     I=INTERMEDIATE     R=RESISTANT       N/S=NON-SUSCEPTIBLE     SDD=SUSCEPTIBLE-DOSE DEPENDENT  _____________________________________________________________________________      COVID-19 (SARS-CoV-2) and Influenza A/B, NAA (Liat Rapid) [[id number]] Collected: 04/08/21 1433    Order Status: Completed Specimen: Culturette from Nasopharyngeal Updated: 04/08/21 1501     Purpose of COVID testing Diagnostic -PUI     SARS-CoV-2 Specimen Source Nasal Swab     SARS CoV 2 Overall Result Not Detected     Comment: __________________________________________________  -A result of "Detected" indicates POSITIVE for the    presence of SARS CoV-2 RNA  -A result of "Not Detected" indicates NEGATIVE for the    presence of SARS CoV-2 RNA  __________________________________________________________  Test performed using the  Roche cobas Liat SARS-CoV-2 assay. This assay is  only for use under the Food and Drug Administration's Emergency Use  Authorization. This is a real-time RT-PCR assay for the qualitative  detection of SARS-CoV-2 RNA. Viral nucleic acids may persist in vivo,  independent of viability. Detection of viral nucleic acid does not imply the  presence of infectious virus, or that virus nucleic acid is the cause of  clinical symptoms. Negative results do not preclude SARS-CoV-2 infection and  should not be used as the sole basis for diagnosis, treatment or other  patient management decisions. Negative results must be combined with  clinical observations, patient history, and/or epidemiological information.  Invalid results may be due to inhibiting substances in the specimen and  recollection should occur. Please see Fact Sheets for patients and providers  located:  [email or web address]          Influenza A Not Detected     Influenza B Not Detected     Comment: Test performed using the Roche cobas Liat SARS-CoV-2 & Influenza A/B assay.  This assay is only for use under the Food and Drug Administration's  Emergency Use Authorization. This is a multiplex real-time RT-PCR assay  intended for the simultaneous in vitro qualitative detection and  differentiation of SARS-CoV-2, influenza A, and influenza B virus RNA. Viral  nucleic acids may persist in vivo, independent of viability. Detection of  viral nucleic acid does not imply the presence of infectious virus, or that  virus nucleic acid is the cause of clinical symptoms. Negative results do  not preclude SARS-CoV-2, influenza A, and/or influenza B infection and  should not be used as the sole basis for diagnosis, treatment or other  patient management decisions. Negative results must be combined with  clinical observations, patient history, and/or epidemiological information.  Invalid results may be due to inhibiting substances in the specimen  and  recollection should occur. Please see Fact Sheets for patients and providers  located: [email or web address]-fact-sheets.         Narrative:      o Collect and clearly label specimen type:  o PREFERRED-Upper respiratory specimen: One Nasal Swab in  Transport Media.  o Hand deliver to laboratory ASAP  Diagnostic -PUI               Cultures  Blood cx  Urine cx   Sputum cx     Abx    CXR     CT     ASSESSMENT/PLAN   Patient Active Hospital Problem List:       Pulmonary embolism with acute cor pulmonale, unspecified chronicity, unspecified pulmonary embolism type (04/09/2021)                    Interval Summary:     Rick [name] is a 88 y.o. male PMH including and significant for CAD (s/p CABG), cardiomyopathy, RBBB, combined systolic and diastolic heart failure, dementia, dyslipidemia, hypertension and cognitive impairment admitted with Pulmonary embolism with acute cor pulmonale, unspecified chronicity, unspecified pulmonary embolism type    Pulmonary embolism right lower lobe, suspected right heart strain  CTA chest: Segmental and subsegmental pulmonary emboli in the right lower lobe. Mild straightening of the interventricular septum as well as reflux of contrast into the intrahepatic IVC, suggesting right heart strain.   Vital signs per unit protocol  Supplemental oxygen to maintain SPO2 > 95%  Continuous cardiac and pulse ox monitoring  Continue heparin drip --> eliquis   troponin's trend negative  Echocardiogram below  Lower extremity venous duplex negative for dvt     Acute systolic heart failure    Last echo showed ef of  30 %.  Iv lasix and monitor --> lasix  Echo The left ventricular cavity size is dilated.  Left ventricular wall  thickness is normal.  Left ventricular systolic function is severely decreased  with an estimated ejection fraction of 25-30%.    * The right ventricular cavity size is upper limits of normal in size.  Mildly decreased right ventricular systolic function.    *  Severe bi-atrial dilation.    * There is a 29mm Sapien 3 TAVR valve in aortic position that is normally  functioning.    * There is severe tricuspid regurgitation.  Moderate pulmonary hypertension  with estimated right ventricular systolic pressure of 54 mmHg.    *  There is moderate pulmonic valve regurgitation.    * There is mild to moderate mitral regurgitation.    * Elevated right atrial pressure.    * Compared to the prior study dated 08/25/2019 (personally reviewed),  tricuspid regurgitation increased. There are frequent PVC's throughout the  consult cardiology       Bradycardia   Off metoprolol  Continue to monitor     Hyperkalemia   Repeat potassium 4.1  9 beats of V. Tach  S/p Kayexalate      History dementia  Continue home medications  Continue follow-up management with outpatient provider(s)       Hx of CAD s/p CABG   Hx of TAVR   Hx combined systolic and diastolic heart failure  Hx hypertension  Hx of dyslipidemia  Continue home medications  Continue follow-up management with outpatient provider(s)    Nutrition: heart healthy    Code: Fc  DVT prophylaxis: heparin  GI Prophylaxis:  Disposition: pending Pt/ot eval              Signed,  Majel Homer, MD  9:09 AM 04/11/2021         This chart was generated using hospital voice-recognition software which does not employ spell-checking or grammar-checking features. It was dictated, all or in part, in a busy and often noisy patient care environment. I have taken all usual measures to dictate carefully and to review all aspects this chart. Nonetheless, given the known and well-documented performance characteristics of VR software in such patient care environments, this dictation still may contain unrecognized and wholly unintended errors or omissions

## 2021-04-11 NOTE — Progress Notes (Signed)
DCP was reviewed and patient agrees with discharge plan and orders.   DCP: home with family supervision; HH PT/OT. PACC notified via epicchat.   Transportation by daughter in Social worker.      04/11/21 1503   Discharge Disposition   Patient preference/choice provided? Yes   Physical Discharge Disposition Home, Home Health   Mode of Transportation Car   Patient/Family/POA notified of transfer plan Yes   Patient agreeable to discharge plan/expected d/c date? Yes   Family/POA agreeable to discharge plan/expected d/c date? Yes   Bedside nurse notified of transport plan? Yes   CM Interventions   Follow up appointment scheduled? No   Reason no follow up scheduled? Family to schedule   Notified MD? Yes   Referral made for home health RN visit? No, Other (comment)   Multidisciplinary rounds/family meeting before d/c? Yes   Medicare Checklist   Is this a Medicare patient? Yes   Patient received 1st IMM Letter? Yes     [place], MSW  Social Work Futures trader I  [facility]  [phone]

## 2021-04-11 NOTE — Progress Notes (Signed)
[facility] Cardiology Progress Note       Assessment and Plan:     88 y.o. male with a history of CAD status post CABG 2003 while living in [place], most recent [facility] 06/2019 with patent LIMA to LAD, patent SVG to OM1, patent SVG to OM2, patent SVG to RCA, status post TAVR 06/2019, mixed ischemic/nonischemic cardiomyopathy with EF 30%, chronic HFrEF, hypertension, dyslipidemia who presented to the hospital on 04/08/2021 with shortness of breath.     #Acute pulmonary embolism- IV heparin transitioned to Eliquis; appears to be tolerating; per medicine     #Acute on chronic HFrEF-at baseline his activity is fairly limited by balance, uses a cane, is not chronically on a diuretic at home.  Does not typically complain of shortness of breath with his normal light activity.  Presented with some degree of volume overload.  proBNP elevated.  Was started on IV Lasix dosing on admission.  Today, on room air, ambulating with PT/OT, denies SOB, lungs are clear.    -Volume management: will change to IV Lasix to Lasix 40 mg po Qday    -GDMT: Not chronically on a beta-blocker due to history of bradycardia.  Yesterday, started low-dose beta-blocker due to PVCs as below (will monitor for bradycardia).  Continue Entresto but will change to his normal home dose of half a tablet twice daily due to history of symptomatic hypotension.    -Strict I's and O's, standing daily weights, fluid and sodium restriction    -K was high but normalized.  Reassess BMP in AM     #Mixed ischemic/nonischemic cardiomyopathy-history of ejection fraction 30% in 2021, felt to be mixed etiology.  Echocardiogram this admission ejection fraction 20-30%.  -GDMT as above     #CAD-history of CABG in [place] in 2003.  Prior to TAVR in 2021 left heart catheterization showed patent bypass grafts as detailed.  He is having no anginal symptoms.  High sensitive troponin is normal.  No ischemic changes on EKG.   -Continue Crestor  -Not on aspirin.   On full anticoagulation for his pulm embolism     #Aortic stenosis, status post TAVR-echocardiogram this admission with normally functioning TAVR.     #Ventricular ectopy-frequent PVCs on telemetry, up to approximately 20/minute on admission.  Some brief runs of NSVT without symptoms.  In the setting of his cardiomyopathy would like to minimize PVC burden.  Yesterday started Lopressor 12.5 mg twice daily.  Ventricular ectopy a bit less today, though on BB does have some SB upper 40's to 50's (60's -70's currently, walking with PT/OT).   - cont low dose BB - if significant brady, will [place].  If HR's tolerate, will plan on [place] home on low dose lopressor for GDMT and PVC's.  Appears medicine was uncomfortable continuuing toprol due to some low HRS, so it was d/c'd).  He appears to be possibly unable to tolerate BB but appears he had brady only during sleep.  Can given 12.5 mg lopressor in AM only and not give PM dose.  Given his advanced age, this  is reasonable to hold at this time and rechallenge in future as well.        We will sign off.      Patient Active Problem List    Diagnosis Date Noted    *HPulmonary embolism with acute cor pulmonale, unspecified chronicity, unspecified pulmonary embolism type [I26.09] 04/09/2021    Acute pulmonary embolism with acute cor pulmonale, unspecified pulmonary embolism type [I26.09] 04/08/2021  Chronic HFrEF (heart failure with reduced ejection fraction) [I50.22] 10/23/2020    Balance disorder [R26.89] 06/05/2020    Dementia without behavioral disturbance, unspecified dementia type [F03.90] 10/17/2019    Difficulty in walking, not elsewhere classified [R26.2] 10/09/2019    S/P TAVR (transcatheter aortic valve replacement) [Z95.2] 08/16/2019    Aortic valve stenosis, etiology of cardiac valve disease unspecified [I35.0] 07/12/2019    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 [I70.45] 03/09/2019    Dry cough [R05.8] 03/09/2019     Nonrheumatic aortic valve stenosis [I35.0] 12/23/2018    Vertebrogenic low back pain [M54.51] 09/28/2018    Memory deficits [R41.3] 05/23/2018    Coronary artery disease with history of myocardial infarction without history of CABG [I25.10, I25.2] 05/23/2018             Subjective:   Denies chest pain, SOB, palpitations  Primary team concerned about low HR, so his BB was stopped      Physical Exam:     Vitals:    04/11/21 0804   BP: 107/68   Pulse: (!) 50   Resp: 15   Temp: 97.5 F (36.4 C)   SpO2: 100%     Temp (24hrs), Avg:97.4 F (36.3 C), Min:97 F (36.1 C), Max:97.6 F (36.4 C)    Weight: 73 kg (161 lb)    Intake and Output Summary (Last 24 hours) at Date Time    Intake/Output Summary (Last 24 hours) at 04/11/2021 0454  Last data filed at 04/11/2021 0418  Gross per 24 hour   Intake --   Output 2550 ml   Net -2550 ml       General appearance - no distress  Neck - supple, Carotids 2+, no bruit, no JVD  Chest - clear to auscultation, no wheezes, rales or rhonchi, symmetric air entry  Heart - normal rate and regular rhythm, S1 and S2 normal, ectopy  Abdomen - soft, nontender, nondistended, no masses or organomegaly  Extremities - peripheral pulses normal, no pedal edema, no clubbing or cyanosis    Medications:     Current Facility-Administered Medications   Medication Dose Route Frequency    apixaban  10 mg Oral Q12H SCH    Followed by    Melene Muller ON 04/16/2021] apixaban  5 mg Oral Q12H SCH    docusate sodium  100 mg Oral BID    furosemide  40 mg Oral Daily    memantine  10 mg Oral BID    rosuvastatin  10 mg Oral QHS    sacubitril-valsartan  0.5 tablet Oral BID    vancomycin   Intravenous See Admin Instructions       Labs:   Recent CMP   Recent Labs     04/11/21  0414   Glucose 77   BUN 27.0   Creatinine 1.2   Sodium 141   Potassium 4.9   CO2 24   Calcium 9.3     Recent CARDIAC ENZYMES No results for input(s): TROPONIN, ISTATTROPONI, CK in the last 24 hours.    Invalid input(s): TROPONINT, CKMB[24  Recent TSH Invalid  input(s): TSH[24  Recent PT/PTT No results for input(s): INR, PTT in the last 24 hours.    Invalid input(s): PTI, COUM, COUMP, ACOAG, ACOAP[24  Recent CBC WITH DIFF No results for input(s): RBC, HGB, HCT, MCV, MCHC, RDW, MPV, PLT in the last 24 hours.    Invalid input(s): WBCIR, ADIFF, REFLX, CANCL, BAND, ABAND[24  Recent LIPID PANEL No results for  input(s): CHOL, TRIG, HDL in the last 24 hours.    Invalid input(s): LDLC, VLDLC, LRAT[24    Rads:     Radiology Results (24 Hour)       ** No results found for the last 24 hours. **            Cardiographics:     Telemetry: SR, SB, PVCs

## 2021-04-11 NOTE — Progress Notes (Signed)
Start [facility] Note  Home Health Referral    Referral from [place] (Case Manager) for home health care upon discharge.    By [employer], the patient has the right to freely choose a home care provider.    A company of the patients choosing. We have supplied the patient with a listing of providers in your area who asked to be included and participate in Medicare.   [facility] formerly [facility], a home care agency that provides adult home care services and participates in Medicare   The preferred provider of your insurance company. Choosing a home care provider other than your insurance company's preferred provider may affect your insurance coverage.      Home Health Discharge Information    Your doctor has ordered Physical Therapy and Occupational Therapy in-home service(s) for you while you recuperate at home, to assist you in the transition from hospital to home.    The agency that you or your representative chose to provide the service:  Name of Home Health Agency Placement: Other (comment box) ([facility]: [phone])]      The above services were set up by:  Marcheta Grammes, RN [facility] Liaison) Phone   [phone]    IF YOU HAVE NOT HEARD FROM YOUR HOME YOUR HOME HEALTH AGENCY WITHIN 24-48 HOURS AFTER DISCHARGE PLEASE CALL YOUR AGENCY TO ARRANGE A TIME FOR YOUR FIRST VISIT. FOR ANY SCHEDULING CONCERNS OR QUESTIONS RELATED TO HOME HEALTH, SUCH AS TIME OR DATE PLEASE CONTACT YOUR HOME HEALTH AGENCY AT THE NUMBER LISTED ABOVE.    Additional comments:        START PATIENT REGISTRATION INFORMATION     Order Information  Order Signing Physician: Majel Homer, MD   Service Ordered RN ?: No    Service Ordered PT ?: Yes  Service Ordered OT ?: Yes  Service Ordered ST ?: No    Service Ordered MSW?: No    Service Ordered HHA?: No    Following Physician: Erasmo Score, MD  Following Physician Phone: [phone]  Overseeing Physician: N/A  (Required for Residents only)   Agreeable to  Follow?: Yes  Spoke with:   Date/Time of Call: 04/11/21 3:24 PM      Care Coordination   [facility] Call from [facility] Required?: no  Same Day [facility]?: no  Primary Care Physician:Sean Arlester Marker, MD  Primary Care Physician Phone:[phone]  Primary Care Physician Address: [address] / [place] [address]  PCP NPI:  Visit Instructions: N/A  Service Discharge Location Type: Home  Service Facility Name: N/A  Service Floor Facility: N/A  Service Room No: N/A    Demographics  Patient Last Name: [name]   Patient First Name: Rick  Language/Communication Barrier: no  Service Address: [address]  [address]  [place] [address]   Service Home Phone: [phone] (home)   Other phone numbers:    Telephone Information:   Mobile [phone]     Emergency Contact: Extended Emergency Contact Information  Primary Emergency Contact: Glyn Ade  Address: [address]           [place], [place] [address] [place] of [place]  Home Phone: [phone]  Mobile Phone: [phone]  Relation: Son  Secondary Emergency Contact: Muscat,Debra  Home Phone: [phone]  Relation: Daughter in law  Primary Phone Number:  : [phone] Home  Secondary Phone Number: [phone]  Relation: Son- Minette [place]land II, [place]    Admission Information  Admit Date: 04/08/2021  Patient Status  at discharge: Inpatient  Admitting Diagnosis:   Acute pulmonary embolism with acute cor pulmonale, unspecified pulmonary embolism type [I26.09]   Pulmonary embolism with acute cor pulmonale, unspecified chronicity, unspecified pulmonary embolism type [I26.09     Caregiver Information  Caregiver First Name: Zadkiel  Caregiver Last Name: [facility]  Caregiver Relationship to Patient: Child  Caregiver Phone Number: [phone]  Caregiver Notes: N/A            Data processing manager Information  Primary Subscriber:   Primary Subscriber Relation To Guarantor:   Primary Payor:   Primary Plan:   Primary Group #:    Primary Subscriber ID:    Primary  Subscriber DOB:   Secondary Insurance Information  Secondary Subscriber:   Secondary Subscriber Relation To Guarantor:   Secondary Payor:   Secondary Plan:   Secondary Group #:   Secondary Subscriber ID:   Secondary Subscriber DOB:   HITECH  NO      END PATIENT REGISTRATION INFORMATION       Diagnosis:   Acute pulmonary embolism with acute cor pulmonale, unspecified pulmonary embolism type [I26.09]   Pulmonary embolism with acute cor pulmonale, unspecified chronicity, unspecified pulmonary embolism type [I26.09     Start [facility] Summary      COVID-19 Screening:    COVID-19   Negative                                                                             If positive or result pending, is patient agreeable to wearing PPE during visit? N/A    Additional Comments:     End [facility] Summary     Discharge Date:  04/11/21    Referral Source  Signed by: Marcheta Grammes, RN  Date Time: 04/11/21 3:24 PM      End [facility] Note             Home Health face-to-face (FTF) Encounter (Order [id number])  Consult  Date: 04/10/2021 Department: [facility] Ordering/Authorizing: Majel Homer, MD     Order Information    Order Date/Time Release Date/Time Start Date/Time End Date/Time   04/10/21 03:39 PM None 04/10/21 03:37 PM 04/10/21 03:37 PM     Order Details    Frequency Duration Priority Order Class   Once 1  occurrence Routine Hospital Performed     Standing Order Information    Remaining Occurrences Interval Last Released     0/1 Once 04/10/2021              Provider Information    Ordering User Ordering Provider Authorizing Provider   Marcheta Grammes, RN Majel Homer, MD Majel Homer, MD   Attending Provider(s) Admitting Provider PCP   Georgette Dover, MD; Ursula Beath, Jess Barters, MD; Majel Homer, MD Ursula Beath, Jess Barters, MD Erasmo Score, MD     Verbal Order Info    Action Created on Order Mode Entered by Responsible Provider Signed by Signed on   Ordering 04/10/21 1539 Telephone with Johnn Hai, RN Majel Homer, MD Majel Homer, MD 04/10/21 8295           Comments    Primary Care Physician:Sean Arlester Marker, MD  Primary Care Physician Phone:[phone]     Acute pulmonary embolism with acute cor pulmonale, unspecified pulmonary embolism type [I26.09]   Pulmonary embolism with acute cor pulmonale, unspecified chronicity, unspecified pulmonary embolism type [I26.09       Home PT/OT required for gait and balance training, strengthening, mobility, fall prevention, and ADL training.                Home Health face-to-face (FTF) Encounter: Patient Communication     Not Released  Not seen         Order Questions    Question Answer   Date I saw the patient face-to-face: 04/10/2021   Evidence this patient is homebound because: C.  Decreased endurance, strength, ROM, cadence, safety/judgment during mobility    E.  Requires assistance of another person or assistive device to ambulate > 5 feet    G.  Fall risk due to impaired coordination, gait and decreased balance    L.  Transfer/ambulation requires stand by assist and verbal cues to perform safely    N.  Impaired mobility d/t pain, arthritis, weakness that compromises patient safety   Medical conditions that necessitate Home Health care: B.  Functional impairment due to recent hospitalization/procedure/treatment    C.  Risk for complication/infection/pain requiring follow up and monitoring    D.  Chronic illness & risk for re-hospitalization due to unstable disease status    E.  Exacerbation of disease requiring follow up monitoring    F.  New diagnosis & treatment requiring follow up monitoring and management    G.  High risk/complex medications requiring instruction and management   Per clinical findings, following services are medically necessary: PT    OT   Clinical findings that support the need for Physical Therapy. PT will A.  Evaluate and treat functional impairment and improve mobility    C.  Educate on weight bearing status,  stair/gait training, balance & coordination    D.  Provide services to help restore function, mobility, and releive pain    E.  Educate on functional mobility; bed, chair, sit, stand and transfer activities    F.  Perform home safety assessment & develop safe in home exercise program    G.  Implement activities to improve stance time, cadence & step length   OT will provide assistance with: Home program to improve ability to perform ADLs    Recovery and maintenance skills    Basic motor function and reasoning abilities    Strategies to compensate for loss of function    Restorative program to improve mobility and independence                    Process Instructions    Please select Home Care Services medically necessary.     Based on the above findings, I certify that this patient is confined to the home and needs intermittent skilled nursing care, physical therapry and / or speech therapy or continues to need occupational therapy. The patient is under my care, and I have initiated the establishment of the plan of care. This patient will be followed by a physician who will periodically review the plan of care.      Collection Information            Consult Order Info    ID Description Priority Start Date Start Time   [id number] Home Health face-to-face (FTF) Encounter Routine 04/10/2021  3:37 PM   Provider Specialty Referred to   ______________________________________  _____________________________________         Acknowledgement Info    For At Acknowledged By Acknowledged On   Placing Order 04/10/21 1539 Luz Lex, RN 04/10/21 1542                     Verbal Order Info    Action Created on Order Mode Entered by Responsible Provider Signed by Signed on   Ordering 04/10/21 1539 Telephone with Johnn Hai, RN Majel Homer, MD Majel Homer, MD 04/10/21 1912           Patient Information    Patient Name   Rick [name] Legal Sex   Male DOB   Aug 25, 1924       Reprint Order  Requisition    Home Health face-to-face (FTF) Encounter (Order #[id number]) on 04/10/21       Additional Information    Associated Reports External References   Priority and Order Details [facility]                  [facility]        Patient Name: TIARA, BARTOLI     MRN: [id number]      CSN: [id number]         Account Information     Hosp Acct #   [id number] Patient Class   Inpatient Service  Medicine Accommodation Code  Intermediate Care      Admission Information     Admitting Physician:  Attending Physician: Ursula Beath, Michaele Offer, MD Unit  [facility] INTERME* L&D Status      Admitting Diagnosis: Acute pulmonary embolism with acute cor pulmonale, unspecified pulmonary e* Room / Bed  A2521/A2521-B L&D - Last Menstrual Cycle      Chief Complaint:       Admit Type:  Admit Date/Time:  Discharge Date/Time: Urgent  04/08/2021 / 2346   /  Length of Stay: 3 Days    L&D EDD   Estimated Date of Delivery: None noted.      Patient Information              Home Address: [address]  [address] [address] [address] Employer:  Employer Address:       ,     Main Phone: [phone] Employer Phone:     SSN: [id number]       DOB: 1924-11-23 (89 yrs)       Sex: Male Primary Care Physician: Erasmo Score, MD   Marital Status: Widowed Referring Physician:       Mingo Amber,*   Race: Black or African American       Ethnicity: Not of Hispanic/Latino/S*       Emergency Contacts  Name Home Phone Work Phone Mobile Phone Relationship JOAOVICTOR, [phone]   [phone] ZAION, HREHA [phone]     Daughter in*           Guarantor Information     Guarantor Name: YAHYA, BOLDMAN Guarantor ID: [id number]   Guarantor Relationship to Pt: Self Guarantor Type: Personal/Family   Guarantor DOB:    1925-01-04       Guarantor Address: [address]  [place], [place] [address]          Guarantor Home Phone: [phone] Guarantor Employer:         Guarantor Work Phone:   Pensions consultant Emp Phone:  Primary Insurance     Insurance Name: MEDICARE-MEDICARE PART A AND B Subscriber Name: [facility]   Insurance Address:    [address]  [place] [address] Subscriber DOB: 02-28-1924      Subscriber ID: [id number]   Insurance Phone:   Pt Relationship to Sub:   Self   Insurance ID:         Group Name:   Preauthorization #: NPR   Group #:   Preauthorization Days:        Secondary Insurance     Insurance Name: [employer] Subscriber Name: Rick [name]   Insurance Address:    [address]  [place], [place] [address] Subscriber DOB: 03-Apr-1924      Subscriber ID: [id number]   Insurance Phone: [phone] Pt Relationship to Sub:   Self   Insurance ID:         Group Name:   Preauthorization #:     Group #:   Preauthorization Days:        [employer] Name: Scientist, clinical (histocompatibility and immunogenetics) Name:     Community education officer Address:       ,   Statistician DOB:        Subscriber ID:     Press photographer:   Pt Relationship to Sub:       Insurance ID:         Group Name:   Preauthorization #:     Group #:   Preauthorization Days:           04/11/2021 3:30 PM

## 2021-04-11 NOTE — Plan of Care (Signed)
NURSING SHIFT NOTE     Patient: Rick [name]  Day: 3      SHIFT EVENTS     Shift Narrative/Significant Events (PRN med administration, fall, RRT, etc.):     A&O X4, forgetful at times. Denies pain. SR to SB on the tele monitor with frequent PVC's , Dr. Alessandra Grout notified. On RA. Assisted with his needs. On PO Lasix. Safety and fall precautions remain in place. Purposeful rounding completed.          ASSESSMENT     Changes in assessment from patient's baseline this shift:    Neuro: No  CV: No  Pulm: No  Peripheral Vascular: No  HEENT: No  GI: No  BM during shift: No, Last BM: Last BM Date: 04/09/21  GU: No   Integ: No  MS: No    Pain: None       ARE PLAN        Problem: Moderate/High Fall Risk Score >5  Goal: Patient will remain free of falls  04/11/2021 1609 by Argentina Ponder, RN  Outcome: Progressing  Flowsheets (Taken 04/11/2021 1405)  High (Greater than 13):   HIGH-Consider use of low bed   HIGH-Initiate use of floor mats as appropriate   HIGH-Apply yellow "Fall Risk" arm band   HIGH-Utilize chair pad alarm for patient while in the chair   HIGH-Bed alarm on at all times while patient in bed  04/11/2021 1410 by Argentina Ponder, RN  Outcome: Progressing  Flowsheets (Taken 04/11/2021 1405)  High (Greater than 13):   HIGH-Consider use of low bed   HIGH-Initiate use of floor mats as appropriate   HIGH-Apply yellow "Fall Risk" arm band   HIGH-Utilize chair pad alarm for patient while in the chair   HIGH-Bed alarm on at all times while patient in bed     Problem: Compromised Hemodynamic Status  Goal: Vital signs and fluid balance maintained/improved  Outcome: Progressing  Flowsheets (Taken 04/11/2021 1405)  Vital signs and fluid balance are maintained/improved:   Monitor/assess vitals and hemodynamic parameters with position changes   Monitor and compare daily weight   Monitor/assess lab values and report abnormal values     Problem: Hemodynamic Status: Cardiac  Goal: Stable vital signs and fluid  balance  Outcome: Progressing  Flowsheets (Taken 04/11/2021 1405)  Stable vital signs and fluid balance:   Monitor/assess vital signs and telemetry per unit protocol   Assess signs and symptoms associated with cardiac rhythm changes   Monitor lab values   Monitor for leg swelling/edema and report to LIP if abnormal   Weigh on admission and record weight daily     Problem: Fluid and Electrolyte Imbalance/ Endocrine  Goal: Fluid and electrolyte balance are achieved/maintained  Outcome: Progressing  Flowsheets (Taken 04/11/2021 1405)  Fluid and electrolyte balance are achieved/maintained:   Monitor intake and output every shift   Monitor daily weight   Monitor/assess lab values and report abnormal values   Assess for confusion/personality changes   Assess and reassess fluid and electrolyte status   Observe for cardiac arrhythmias     Problem: Inadequate Gas Exchange  Goal: Adequate oxygenation and improved ventilation  Outcome: Progressing  Flowsheets (Taken 04/11/2021 1610)  Adequate oxygenation and improved ventilation:   Assess lung sounds   Monitor SpO2 and treat as needed   Monitor and treat ETCO2     Problem: Everyday - Heart Failure  Goal: Stable Vital Signs and Fluid Balance  Outcome: Progressing  Flowsheets (Taken 04/11/2021 1611)  Stable  Vital Signs and Fluid Balance:   Daily Standing Weights in the morning using the same scale, after using the bathroom and before breadfast.  If unable to stand, zero the bed and use the bed scale   Monitor, assess vital signs and telemetry per policy   Monitor labs and report abnormalities to physician   Strict Intake/Output     Problem: Day of Discharge - Heart Failure  Goal: Discharge Education  Outcome: Progressing  Flowsheets (Taken 04/11/2021 1611)  Day of Discharge:   After Visit Summary (Discharge Instuctions) with medications   Daily Standing Weights

## 2021-04-11 NOTE — Discharge Instr - AVS First Page (Addendum)
Reason for your Hospital Admission:  Pulm embolism   CHF    Instructions for after your discharge:      Heart Failure Discharge Instructions      Follow Up:   Most likely there are specific follow up instructions listed in another section of these discharge instructions. If we have not already scheduled a follow-up appointment with your physician (Cardiologist or (PCP) Primary Care Physician),  a good rule of thumb is to call your PCP as soon as you are home to make an appointment to be seen within 3 - 5 days of discharge.  If you do not have a PCP, please call [phone] for assistance 24 hours a day, 7 days a week.    Activity:   Unless otherwise instructed, gradually increase your activity as tolerated.  Let your symptoms guide you.  If your doctor has not discussed about enrolling you in cardiac rehab, bring it up at your next appointment     Diet:   Follow a heart healthy diet low in saturated fat and cholesterol.  Limit salt (sodium) in your diet to less than 2000 mg daily (1 teaspoon salt = 2400 mg sodium).   Consult with your physician before using a salt substitute, which may be high in potassium.    Fluid restriction:  Limit your daily fluid intake to 1.5 liters or 48 ounces.    Risk Factors:   All patients are advised to not smoke or use any tobacco products.   If you use tobacco products and want help in quitting, call the QuitLine:1-800-QUIT-NOW (704-366-2898).  Know the symptoms of heart failure use the Heart Failure Zone information below to manage symptoms.    Weight Monitoring:    Rapid changes in weigh may reflect retention of fluid, which will worsen your heart failure symptoms.   Weigh yourself each morning before breakfast.    To ensure consistent results, place your scale on a section of bare floor without rug or carpeting.    Record date, time, and weight in your notebook and bring it with you to appointments to review with your doctor.    Review the Heart Failure Zone instructions for  when to call your doctor.                              Managing your Heart Failure Using the "Zones"      Green Zone - All Clear  Action     No new or worsening shortness of breath  No new or worsening swelling of your hands, abdomen, legs or ankles  No Weight gain  No chest pain or tightness  No decrease in your ability to maintain your activity level     Continue taking your medications as ordered  Continue your daily weights  Follow a low salt diet  Keep all physician appointments and follow-ups     Yellow Zone - Caution Action     Weight gain of 2 pounds in a day  Increased swelling of your hands, abdomen, legs, or ankles  Increased in shortness of breath with activity  Increase in the number of pillows needed to sleep at night  New or more frequent chest pain or tightness  New onset of dizziness or lightheadedness after standing up     Call your physician's office      Do not wait until you are RED to call.    Call for even one symptom!  Red Zone - "Emergency" Action     Unrelieved shortness of breath or shortness of breath at rest  Unrelieved chest pain  Wheezing or chest tightness at rest  Need to sit in a chair to sleep  Weight gain of more than 3 pounds in a day or 5 pounds in a week  A fall related to dizziness or lightheadedness      CALL your physician's office right away!      Call 911 if you:  Faint or pass out  Become extremely short of breath or are unable to talk due to breathlessness  Have severe chest pain     Learn more at [email or web address]                                  Heart Failure Education Checklist    I understand that my heart has structural and/or functional problems which cause the symptoms of heart failure.    I know what my Ejection Fraction (EF), a measure of heart function, is:________%    I know what medications I take for heart failure and understand how to take them    I understand the importance of taking my medications, exactly as they are prescribed so that I can feel  better, live longer, and stay out of the hospital    I understand the importance of weighing myself daily and that I should contact my physician if I have unexpected weight gain (2 pounds in one day)    I know what symptoms to look for to make sure my heart failure is not getting worse such as swelling of legs or ankles, more trouble breathing, dizziness, or feeling tired    I understand that physical activity is good for me and I have been given instructions on what type of exercises to do    I understand that I should maintain a low salt diet, monitor my fluid intake, and follow dietary instructions provided by my healthcare provider    I understand the heart failure zones and know what to do if my symptoms are in the yellow or red zone  [place] Pulmonary Embolism Discharge Instructions     You have been seen for a Pulmonary Embolism.     A pulmonary embolism (PE) is a blood clot in the lung usually causing either chest pain or shortness of breath. In most cases the blood clot (thrombus) originally forms in the deep veins of the legs, thighs, or pelvis. This condition is known as a deep vein thrombosis (DVT). A pulmonary embolism happens when this clot breaks loose. It travels through the veins of the body and gets stuck in the blood vessels of the lung. A PE is not common, but it can be a very serious condition. The clot or clots in the lung block the blood supply from parts of the lung. It can keep the body from getting the oxygen it needs. This can cause injury to many different organs. In rare cases, a clot can be large enough to even cause the heart to stop.  In your case, the risk of complications is significantly low, so outpatient management of your PE is the best option.      Anyone can develop a pulmonary embolism. However, there are a number of things that put you at higher risk for blood clots. These risks include:     Pregnancy.   Post-partum (within 4 weeks  after delivery). The period after the baby is  born has the highest risk of blood clots.  The use of birth control hormone pills. This includes the kind of birth control device called the NuvaRing (a hormone-releasing flexible plastic ring that is put inside the vagina). The risk of clots is much higher if you smoke at the same time as using birth control hormones.  Hormone replacement therapy with estrogen.  Cancer that is currently active.  Recent surgery (within the last 4 weeks).  Prolonged bed rest or prolonged sitting, like with a long plane or car ride. This usually becomes a problem if the person is sitting continuously for longer than 6 hours without standing and walking around.  Advanced age.  Inherited (genetic) conditions that make clotting more likely. These include certain genetic conditions like Factor V Leiden, antiphospholipid syndrome, prothrombin gene mutation, lupus anticoagulants, Protein S or Protein C deficiency and others.     Blood thinning medications (anticoagulation therapy) are usually used to treat a pulmonary embolism.  Rivaroxaban (Xarelto) and apixaban (Eliquis) are commonly prescribed these days. These are usually taken for 3-6 months. The goal of treatment with blood thinners is to prevent the blood clot from getting larger until the body's natural mechanisms for breaking down clots is able to dissolve the clot.      If you were prescribed Xarelto and you have commercial health insurance with prescription coverage, you may be eligible for the [employer] and instant savings.  To take advantage of this offer, visit:  [email or web address].   You may also call:  1-888-XARELTO (980-222-4823).  If you were prescribed Eliquis, the manufacturer offers a Co-pay Card to help with out-of-pocket costs for [facility], and a Free Trial Offer Card too. To see if you are eligible to take advantage of these offers, visit:  [email or web address].   You may also call:  1-855-ELIQUIS ((907)869-3489) from Monday - Friday, 8 AM - 8 PM (EST) or Saturday - Sunday, 9 AM - 6 PM (EST).  Take your medication as directed. Anticoagulants can have serious side-effects. If you forget a dose, call your provider or clinic for advice. Patients on "blood thinning" medication may bleed or bruise easier. Bleeding can develop in many areas like the stomach and bowels, the brain, or the urinary system. Any unusual bleeding while on blood thinners can be dangerous. Contact your doctor immediately for abnormal bleeding.      Many doctors suggest that people taking blood thinner medication should wear a medical alert bracelet. If you need emergency medical treatment and can't explain your condition, the bracelet will let the healthcare providers know that you are taking anticoagulation medication and have a higher risk of bleeding complications.      In order to reduce the risk of bleeding, you should do the following:     Shave with electric razor instead of a blade.  Avoid activities that put you in harm's way (contact sports, for example).  Use a soft bristle when brushing your teeth.  DO NOT take aspirin or other NSAIDs (non-steroidal anti-inflammatory drugs); ibuprofen (Advil or Motrin), naproxen (Aleve, Naprosyn) and others while using anticoagulants unless your doctor says it's ok.     Though we don't believe your condition is dangerous right now, it is important to be careful. Sometimes a problem that seems mild can become serious later. This is why it is very important that you return here or go to the nearest Emergency Department if you don't get better or  your symptoms get worse.     Very close follow-up with your doctor is necessary.  Pulmonologist (lung specialist) referral is also recommended.    YOU SHOULD SEEK MEDICAL ATTENTION IMMEDIATELY, EITHER HERE OR AT THE NEAREST EMERGENCY DEPARTMENT, IF  ANY OF THE FOLLOWING OCCUR:     You have new chest pain or pain with breathing.  You get very short of breath.  You notice blood in your cough.  You notice increased leg swelling or redness of the leg.  You feel like you might pass out.  You start to vomit blood.  You see blood in your stool (poop). This means it will be bright red or black and tarry.  You get a severe headache.  You get a fever (temperature higher than 100.48F or 38C).  You have any other symptoms, concerns, or you are not getting better as expected.  You get worse or feel you can't wait until your follow-up appointment to get treated.    If you can't follow up with your doctor, or if at any time you feel you need to be rechecked or seen again, come back here or go to the nearest emergency department.               Home Health Discharge Information     Your doctor has ordered Physical Therapy and Occupational Therapy in-home service(s) for you while you recuperate at home, to assist you in the transition from hospital to home.     The agency that you or your representative chose to provide the service:  Name of Home Health Agency Placement: Other (comment box) ([facility]: [phone])]        The above services were set up by:  Marcheta Grammes, RN [facility] Liaison) Phone   [phone]     IF YOU HAVE NOT HEARD FROM YOUR HOME YOUR HOME HEALTH AGENCY WITHIN 24-48 HOURS AFTER DISCHARGE PLEASE CALL YOUR AGENCY TO ARRANGE A TIME FOR YOUR FIRST VISIT. FOR ANY SCHEDULING CONCERNS OR QUESTIONS RELATED TO HOME HEALTH, SUCH AS TIME OR DATE PLEASE CONTACT YOUR HOME HEALTH AGENCY AT THE NUMBER LISTED ABOVE.

## 2021-04-11 NOTE — Discharge Summary (Signed)
Patient: Rick [name] [facility]  Admission Date: 04/08/2021   DOB: Mar 26, 1924  Discharge Date: 04/11/2021    MRN: [id number]  Discharge Attending:Yama Nielson Alessandra Grout, MD     Referring Physician: Erasmo Score, MD  PCP: Erasmo Score, MD       DISCHARGE SUMMARY     Discharge Information    Discharge Diagnosis:   Active Hospital Problems    Diagnosis    Pulmonary embolism with acute cor pulmonale, unspecified chronicity, unspecified pulmonary embolism type        Admission Condition: guarded   Discharge Condition: improved/stable  Consultants: Cardiology                                               Treatment Team: Attending Provider: Majel Homer, MD; Consulting Physician: Hughie Closs, MD; Technician: Neysa Bonito; Registered Nurse: Argentina Ponder, RN; Registered Nurse: Suanne Marker, RN  Discharged to: Home with home health    Discharge Medications:     Medication List        START taking these medications      * apixaban 5 MG  Commonly known as: ELIQUIS  Take 2 tablets (10 mg) by mouth every 12 (twelve) hours for 5 days     * apixaban 5 MG  Commonly known as: ELIQUIS  Take 1 tablet (5 mg) by mouth every 12 (twelve) hours  Start taking on: April 16, 2021     doxycycline 100 MG tablet  Commonly known as: VIBRA-TABS  Take 1 tablet (100 mg) by mouth 2 (two) times daily for 7 days     furosemide 40 MG tablet  Commonly known as: LASIX  Take 1 tablet (40 mg) by mouth daily  Start taking on: April 12, 2021           * This list has 2 medication(s) that are the same as other medications prescribed for you. Read the directions carefully, and ask your doctor or other care provider to review them with you.                CONTINUE taking these medications      acetaminophen 325 MG tablet  Commonly known as: TYLENOL  Take 2 tablets (650 mg total) by mouth every 4 (four) hours as needed for Pain     docusate sodium 100 MG capsule  Commonly known as: COLACE     memantine 10 MG tablet  Commonly known as: NAMENDA  Take 2  tablets (20 mg total) by mouth every evening     multivitamin capsule     rosuvastatin 20 MG tablet  Commonly known as: CRESTOR     sacubitril-valsartan 24-26 MG Tabs per tablet  Commonly known as: ENTRESTO  Take 1 tablet by mouth 2 (two) times daily 1/2     SUPPLEMENTS NOT FOUND IN DATABASE     vitamin D 25 MCG (1000 UT) tablet  Commonly known as: CHOLECALCIFEROL               Where to Get Your Medications        These medications were sent to [facility] [place] [place] - [address] [facility]  [address] [place] [address]      Phone: [phone]   apixaban 5 MG  apixaban 5 MG  doxycycline 100 MG tablet  furosemide 40  MG tablet       Information about where to get these medications is not yet available    Ask your nurse or doctor about these medications  sacubitril-valsartan 24-26 MG Tabs per tablet             Hospital Course   Presentation History   Per HPI      See HPI for details.    Hospital Course (3 Days)   (See HPI, daily progress and consult notes for details)  Pulmonary embolism right lower lobe, suspected right heart strain  CTA chest: Segmental and subsegmental pulmonary emboli in the right lower lobe. Mild straightening of the interventricular septum as well as reflux of contrast into the intrahepatic IVC, suggesting right heart strain.   Vital signs per unit protocol  Supplemental oxygen to maintain SPO2 > 95%  Continuous cardiac and pulse ox monitoring  Continue heparin drip --> eliquis   troponin's trend negative  Echocardiogram below  Lower extremity venous duplex negative for dvt     Acute systolic heart failure    Last echo showed ef of  30 %.  Iv lasix and monitor --> lasix  Echo The left ventricular cavity size is dilated.  Left ventricular wall  thickness is normal.  Left ventricular systolic function is severely decreased  with an estimated ejection fraction of 25-30%.    * The right ventricular cavity size is upper limits of normal in size.  Mildly decreased  right ventricular systolic function.    * Severe bi-atrial dilation.    * There is a 29mm Sapien 3 TAVR valve in aortic position that is normally  functioning.    * There is severe tricuspid regurgitation.  Moderate pulmonary hypertension  with estimated right ventricular systolic pressure of 54 mmHg.    * There is moderate pulmonic valve regurgitation.    * There is mild to moderate mitral regurgitation.    * Elevated right atrial pressure.    * Compared to the prior study dated 08/25/2019 (personally reviewed),  tricuspid regurgitation increased. There are frequent PVC's throughout the  consult cardiology         Bradycardia   Off metoprolol       Hyperkalemia   Repeat potassium 4.1  9 beats of V. Tach  S/p Kayexalate     Urinary tract infection  Colonization of this issue infection  Treat with doxycycline    History dementia  Continue home medications  Continue follow-up management with outpatient provider(s)        Hx of CAD s/p CABG   Hx of TAVR   Hx combined systolic and diastolic heart failure  Hx hypertension  Hx of dyslipidemia  Continue home medications  Continue follow-up management with outpatient provider(s)    Procedures/Imaging:          RECOMMENDATIONS:     - Patient was informed of abnormal and incidental imaging findings during hospitalization, and advised to review this information with their medical provider.           Progress Note/Physical Exam at Discharge     Subjective: No new complaints    Vitals:    04/11/21 0804 04/11/21 0848 04/11/21 1231 04/11/21 1539   BP: 107/68 146/77 149/76 122/61   Pulse: (!) 50  (!) 56 (!) 47   Resp: 15  14 15    Temp: 97.5 F (36.4 C)  97.7 F (36.5 C) 97.7 F (36.5 C)   TempSrc: Oral  Oral Axillary   SpO2:  100%  93% 98%   Weight: 73 kg (161 lb)      Height:                 PHYSICAL EXAMINATION  GEN APPEARANCE: NAD.  Pleasant, alert and cooperative. Comfortable.   HEENT: NCAT, EOMI, PERRL, no nasal discharge.   conjunctivae/corneas clear. Oral mucosa moist.    Neck: Supple without meningismus   CVS: RRR, S1, S2. No murmur   LUNGS: CTAB; No wheezes, crackles, rhonchi or rales  ABD: BS+, soft, NT, ND, no guarding or rigidity  EXT: No edema; Pulses 2+ and intact  SKIN: skin is warm and dry. No rash noted.  MSK: full ROM intact. No joint tenderness or swelling.   NEURO: AAOx, CN 2-12 intact.  Pleasantly confused no focal neurological deficits  MENTAL STATUS: appropriate affect and mood                Diagnostics     Labs/Studies Pending at Discharge: No    Last Labs   Recent Labs   Lab 04/10/21  0439 04/09/21  2109 04/08/21  1820   WBC 6.13 7.61 3.16   RBC 3.79* 3.63* 3.54*   Hgb 12.8 12.3* 12.0*   Hematocrit 40.0 38.5 36.9*   MCV 105.5* 106.1* 104.2*   Platelets 94* 93* 84*       Recent Labs   Lab 04/11/21  0414 04/10/21  0439 04/09/21  2109 04/08/21  1433   Sodium 141 142 144 147*   Potassium 4.9 5.7* 4.3 4.5   Chloride 104 106 107 114*   CO2 24 26 30* 24   BUN 27.0 31.0* 28.0 26.0   Creatinine 1.2 1.5 1.6* 1.3   Glucose 77 98 95 119*   Calcium 9.3 9.6 9.4 9.2   Magnesium 1.9 1.9  --   --        Microbiology Results (last 15 days)       Procedure Component Value Units Date/Time    Urine culture [[id number]] Collected: 04/08/21 1537    Order Status: Completed Specimen: Bladder Updated: 04/10/21 2006    Narrative:      ORDER#: [id number]                                    ORDERED BY: REDDEN, LUKE  SOURCE: Urine                                        COLLECTED:  04/08/21 15:37  ANTIBIOTICS AT COLL.:                                RECEIVED :  04/09/21 05:16  Culture Urine                              FINAL       04/10/21 20:06   +  04/10/21   10,000 - 30,000 CFU/ML of normal urogenital or skin microbiota, no             further work  04/10/21   50,000 - 70,000 CFU/ML Methicillin Resistant Staph aureus      _____________________________________________________________________________  MRSA        ANTIBIOTICS                     MIC  INTRP       _____________________________________________________________________________  Daptomycin                      <=1    S        Gentamicin                      <=2    S        Levofloxacin                    >4     R        Linezolid                       <=1    S        Nitrofurantoin                 <=16    S  D1    Oxacillin                       >2     R  D2    Penicillin                      >1     R        Rifampin                       <=0.5   S        Tetracycline                   <=0.5   S        Trimethoprim/Sulfamethoxazole  >2/38   R  D3    Vancomycin                     <=0.5   S          -----DRUG COMMENTS----------    D1:  Nitrofurantoin should only be used for the treatment of         uncomplicated cystitis.         [place] Antimicrobial Subcommittee June 2015    D2:  Oxacillin sensitivity predicts sensitivity to cephalosporins.         [place] Antimicrobial Subcommittee 2016    D3:  This test method may generate false resistant results for         S. aureus with Trimethoprim sulfamethoxazole. If Trimethoprim         sulfamethoxazole is Resistant but is the preferred antibiotic,         please contact the laboratory for confirmatory testing.  _____________________________________________________________________________            S=SUSCEPTIBLE     I=INTERMEDIATE     R=RESISTANT       N/S=NON-SUSCEPTIBLE     SDD=SUSCEPTIBLE-DOSE DEPENDENT  _____________________________________________________________________________      COVID-19 (SARS-CoV-2) and Influenza A/B, NAA (Liat Rapid) [[id number]] Collected: 04/08/21 1433    Order Status: Completed Specimen: Culturette from Nasopharyngeal Updated: 04/08/21 1501     Purpose of COVID testing Diagnostic -PUI     SARS-CoV-2 Specimen Source Nasal Swab     SARS CoV 2 Overall  Result Not Detected     Comment: __________________________________________________  -A result of "Detected" indicates POSITIVE for the    presence of SARS CoV-2 RNA  -A result of "Not  Detected" indicates NEGATIVE for the    presence of SARS CoV-2 RNA  __________________________________________________________  Test performed using the Roche cobas Liat SARS-CoV-2 assay. This assay is  only for use under the Food and Drug Administration's Emergency Use  Authorization. This is a real-time RT-PCR assay for the qualitative  detection of SARS-CoV-2 RNA. Viral nucleic acids may persist in vivo,  independent of viability. Detection of viral nucleic acid does not imply the  presence of infectious virus, or that virus nucleic acid is the cause of  clinical symptoms. Negative results do not preclude SARS-CoV-2 infection and  should not be used as the sole basis for diagnosis, treatment or other  patient management decisions. Negative results must be combined with  clinical observations, patient history, and/or epidemiological information.  Invalid results may be due to inhibiting substances in the specimen and  recollection should occur. Please see Fact Sheets for patients and providers  located:  WirelessDSLBlog.no          Influenza A Not Detected     Influenza B Not Detected     Comment: Test performed using the Roche cobas Liat SARS-CoV-2 & Influenza A/B assay.  This assay is only for use under the Food and Drug Administration's  Emergency Use Authorization. This is a multiplex real-time RT-PCR assay  intended for the simultaneous in vitro qualitative detection and  differentiation of SARS-CoV-2, influenza A, and influenza B virus RNA. Viral  nucleic acids may persist in vivo, independent of viability. Detection of  viral nucleic acid does not imply the presence of infectious virus, or that  virus nucleic acid is the cause of clinical symptoms. Negative results do  not preclude SARS-CoV-2, influenza A, and/or influenza B infection and  should not be used as the sole basis for diagnosis, treatment or other  patient management decisions. Negative results must be combined  with  clinical observations, patient history, and/or epidemiological information.  Invalid results may be due to inhibiting substances in the specimen and  recollection should occur. Please see Fact Sheets for patients and providers  located: http://www.rice.biz/.         Narrative:      o Collect and clearly label specimen type:  o PREFERRED-Upper respiratory specimen: One Nasal Swab in  Transport Media.  o Hand deliver to laboratory ASAP  Diagnostic -PUI                  Discharge Instructions For Providers      Follow up oxygen requirements   Follow up leukocytosis after completion of steroids   Follow-up chest x-ray in 4-6 weeks  Monitor blood glucose and adjust insulin as needed       Patient Instructions   Discharge Activity: As tolerated  Discharge Diet: Heart healthy diet  Fluid Guidelines (all liquids including food, i.e., soup, ice cream): No  Weigh self daily at the same time of day. Call if > 3 lbs weight gain in one day or 5 lb weight gain over a week.  Call your physician if you have increased cough, shortness of breath increase leg swelling or chest tightness or pain.  Diet: Eat a low salt diet. Avoid excess salt. Your sodium intake should be less than 2000 milligrams per day.    Other Instructions or Counseling Content:  Please return to ED or call your physician if you have:     1. Fevers, chills    2. Abdominal pain and/or distention, GI bleed    3. Intractable nausea, vomiting or diarrhea, urinary symptoms     4. Inability to tolerate adequate oral intake of food     5. Neurologic changes, chest pain or shortness of breath    Recommended or Scheduled Appointments: It is critical that you make these follow-up appointment(s) as scheduled or recommend. If you are discharged on the weekend or after business hours, or if we are unable to schedule these appointments for you for any reason, you or a family member need to call during the next business day to schedule these  appointments.  Follow Up Appointment:   Follow-up Information       Erasmo Score, MD Follow up in 2 week(s).    Specialty: Family Medicine  Why: pulm embolism  Contact information:  [address]  [address]  [place] [address]  [phone]               Gilford Raid, NP. Schedule an appointment as soon as possible for a visit in 1 week(s).    Specialty: Nurse Practitioner  Contact information:  [address] [place]  [address]  [place] [address]  [phone]                              Discharge plans, hospital course and recommended followup were communicated to the patient. The patient understands the discharge instructions, plans, and follow up, and is in agreement. All concerns were addressed and questions answered.     Time spent examining patient, discussing with patient/family regarding hospital course, chart review, reconciling medications and discharge planning: 36 minutes.    I spent more than 30 mins discharging the patient, including discussion of follow up plans, discharge instructions, placing orders, and writing scripts and the discharge summary.    Signed,  Majel Homer, MD    4:28 PM 04/11/2021       This chart was generated using hospital voice-recognition software which does not employ spell-checking or grammar-checking features. It was dictated, all or in part, in a busy and often noisy patient care environment. I have taken all usual measures to dictate carefully and to review all aspects this chart. Nonetheless, given the known and well-documented performance characteristics of VR software in such patient care environments, this dictation still may contain unrecognized and wholly unintended errors or omissions

## 2021-04-12 LAB — ECG 12-LEAD
Atrial Rate: 54 {beats}/min
P Axis: 63 degrees
P-R Interval: 170 ms
Q-T Interval: 512 ms
QRS Duration: 160 ms
QTC Calculation (Bezet): 536 ms
R Axis: -14 degrees
T Axis: -32 degrees
Ventricular Rate: 66 {beats}/min

## 2021-04-12 NOTE — Progress Notes (Signed)
No further follow-up required.  Reviewed by Dr. Lewis   Date:04/12/21  Time:1400

## 2021-04-14 ENCOUNTER — Other Ambulatory Visit: Payer: Self-pay

## 2021-04-14 ENCOUNTER — Telehealth (INDEPENDENT_AMBULATORY_CARE_PROVIDER_SITE_OTHER): Payer: Self-pay

## 2021-04-14 NOTE — Progress Notes (Signed)
Ambulatory Care Management Progress Note     04/14/21 1443   Pt Outreach/Care Plan Metrics   Payor Grouping for Outreach MSSP   Outreach Status for Metrics listing Left Voicemail 1     Nurse Navigator called pt today to offer care management support. Pt did not answer. VM left with this NN's contact information and a request for a callback.      QMVHQ-ION Vivien Presto RN, BSN, CCM, MBA  Patient Care Navigator  Ambulatory Care Management  [facility]  [phone]

## 2021-04-14 NOTE — Telephone Encounter (Signed)
Hospital D/C Template    Chart Review:     Type of Encounter : Inpatient  Facility: [facility]  Discharge Date: 04/11/21  Primary Discharge Dx: Pulmonary embolism with acute cor pulmonale, unspecified chronicity, unspecified pulmonary embolism type  Pulmonary embolism right lower lobe, suspected right heart strain  Acute systolic heart failure   Bradycardia    Hyperkalemia   Urinary tract infection  Hx of CAD s/p CABG   Hx of TAVR   Hx combined systolic and diastolic heart failure  Hx hypertension  Hx of dyslipidemia    Follow Up Appt with PCP/Specialist: needs FUV with Cardiology and PCP    TC for post hospital follow-up, LVM for pt /family regarding current status and FUV for ongoing care, advised to contact PCP office to schedule hospital fuv; will attempt to reach pt for discharge follow-up call again next business day.                For   Patient Interview:    "I heard you went to the hospital for _______, tell me what happened? Hx- haivng cough, congestion, wheezing sx for past week- found to have new PE  What type of teaching did you receive regarding your symptoms/diagnosis?  What symptoms do you have now, if any?  Tell me about your current medications and any changes the hospital made: resume routine meds per AVS  If new medication prescribed: eliquis bid, doxycycline bid, lasix 40 mg daily  Tell me about the new medication and how you are to take it?  What difficulty did you have, if any, picking up your medication(s)?  If the new medication is an injection: n/a    What appointments have you made for follow-up?  What transportation options do you have to make it to your appointments? By car, family members drive  What assistance, if any, are you in need of while continuing your care at home?  Mostly indep, may need some occasional assist with ADL, mobility- lives at home with son, dtr in law  [facility], for PT, OT      "Thank you for taking my call today! If there is anything that you need,  please give your office a call at [facility], [phone]."       *Instructed patient to call back if symptoms change or worsen and/or go to the emergency room or call 911 for emergency symptoms*

## 2021-04-15 ENCOUNTER — Other Ambulatory Visit: Payer: Self-pay

## 2021-04-15 NOTE — Progress Notes (Addendum)
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    Dry cough    Aortic valve stenosis, etiology of cardiac valve disease unspecified    S/P TAVR (transcatheter aortic valve replacement)    Difficulty in walking, not elsewhere classified    Dementia without behavioral disturbance, unspecified dementia type    Balance disorder    Chronic HFrEF (heart failure with reduced ejection fraction)    Acute pulmonary embolism with acute cor pulmonale, unspecified pulmonary embolism type    Pulmonary embolism with acute cor pulmonale, unspecified chronicity, unspecified pulmonary embolism type       Patient Care Team:  Erasmo Score, MD as PCP - General (Family Medicine)  Stephens Shire, MD as Consulting Physician (Cardiology)  Fortino Sic, MD as Consulting Physician (Cardiology)  Arlyss Queen, MD PhD as Consulting Physician (Interventional Cardiology)  Genevie Ann, RN as Registered Nurse  [name], MD as Consulting Physician (Thoracic and Cardiac Surgery)  Casper Harrison, RN as Registered Nurse  Daleen Snook, NP as Nurse Practitioner (Nurse Practitioner)  Osborne Casco, Watt Climes, NP as Nurse Practitioner (Nurse Practitioner)  Bari Edward, MD as Consulting Physician (Cardiology)  Gilford Raid, NP as Nurse Practitioner (Nurse Practitioner)  Smitty Pluck R, RN as Case Manager    Health Maintenance   Topic Date Due    Annual Exam  Never done    Shingrix Vaccine 50+ (1) Never done    COVID-19 Vaccine (4 -  Booster for Pfizer series) 02/19/2020    INFLUENZA VACCINE  09/16/2020    Pneumonia Vaccine Age 69+ (2 - PCV) 11/23/2020    FALLS RISK ANNUAL  05/01/2021    Statin Use  10/01/2021    Tetanus Ten-Year  06/16/2027    DEPRESSION SCREENING  Discontinued         Immunization History   Administered Date(s) Administered    COVID-19 mRNA MONOVALENT vaccine PRIMARY SERIES 12 years and above [employer]) 30 mcg/0.3 mL (DILUTE BEFORE USE) 04/14/2019, 05/05/2019, 12/25/2019    INFLUENZA HIGH DOSE 65 YRS+ 01/07/2016    INFLUENZA QUADRIVALENT ADJUVANTED PF 65 YRS & OLDER (FLUAD) 11/24/2019    Pneumococcal 23 valent 11/24/2019    TD ADULT, NOT ABSORBED 01/07/2016    Tdap 06/15/2017          No Known Allergies       Current Outpatient Medications   Medication Sig    acetaminophen (TYLENOL) 325 MG tablet Take 2 tablets (650 mg total) by mouth every 4 (four) hours as needed for Pain    apixaban (ELIQUIS) 5 MG Take 2 tablets (10 mg) by mouth every 12 (twelve) hours for 5 days    [START ON 04/16/2021] apixaban (ELIQUIS) 5 MG Take 1 tablet (5 mg) by mouth every 12 (twelve) hours    docusate sodium (COLACE) 100 MG capsule Take 1 capsule (100 mg) by mouth 2 (two) times daily  doxycycline (VIBRA-TABS) 100 MG tablet Take 1 tablet (100 mg) by mouth 2 (two) times daily for 7 days    furosemide (LASIX) 40 MG tablet Take 1 tablet (40 mg) by mouth daily    memantine (NAMENDA) 10 MG tablet Take 2 tablets (20 mg total) by mouth every evening    Multiple Vitamin (multivitamin) capsule Take 1 capsule by mouth daily    rosuvastatin (CRESTOR) 20 MG tablet     sacubitril-valsartan (ENTRESTO) 24-26 MG Tab per tablet Take 1 tablet by mouth 2 (two) times daily 1/2    SUPPLEMENTS NOT FOUND IN DATABASE Take 1 tablet by mouth 2 (two) times daily Stop 3 days prior to TAVR   Super Beta plus    vitamin D (CHOLECALCIFEROL) 25 MCG (1000 UT) tablet Take 2 tablets (2,000 Units) by mouth Once every Monday, Wednesday and Friday morning     Pt was discharged to home with  [facility] from [facility] on 2/24 with d/c diagnosis of pulmonary embolism with acute cor pulmonale. Was started on Eliquis. Was also found to have UTI and was on antibiotic treatment. Pt has history of CHF and is taking Entresto and furosemide.     Nurse Navigator spoke with pt and son, Michel II, today and introduced and offered care management support. Pt has history of dementia and is taking memantine. Son is primary caregiver. Amenable to participating with care management.     Pt needs assistance with ADLS and IADLs. Uses a cane, walker and wheelchair. Has bench and grab bars in shower. Lives with son and daughter-in-law. They have a multilevel home and pt uses in-home elevator.     Pt has Medicare and Tricare. He goes to the [place] for some of his medical needs.       Goals:   1. Get home health PT/OT  2. Get help navigating post-hospital care  3. Get more information on management of CHF    Action Items:   Go to post-hospital PCP follow up tomorrow  Schedule cardiology and neurology follow ups  Call geriatrician to establish care  Check daily weight  Monitor for signs of CHF exacerbation  Eat a heart healthy diet    Education provided to meet goals:     * AVS     AVS reviewed with pt's son. Son and DIL help with medication administration. Son said pt is taking all his medications as ordered. Has finished antibiotic treatment.      Pt has PCP follow up tomorrow. Reminded to schedule cardiology visit. Son said he will do this.     * PT/OT    Was discharged to home with [facility] PT/OT from [facility] ([id number], [phone]). They have not received call from agency yet. NN spoke with Tiera of [facility]. She did not have information on hospital referral and could not get a hold of their intake department at the time. NN faxed [facility] referral, face sheet, d/c summary and the PT/OT notes to Tiera per her request. Blima Singer confirmed she received all documents. They will try to schedule [facility] for [facility] as soon as possible.      * Memory  impairment    Son concerned about  pt having more cognitive decline since hospitalization. Pt sees a neurologist in the [place] and cannot recall when was pt's last visit. Was also trying to get geriatrician care last year but did not go through d/t missed phone calls. Advised son to schedule neuro follow up and call [facility] ger[facility]ian service again.  Son said he will do this.     * Falls prevention    Falls prevention measures reviewed. Son said they are in place.     * CHF management    Pt does not get weight checked daily. Son said pt's weight is stable and appetite is good.     CHF management teaching provided. Advised that daily weight checks is important to monitor for fluid retention. Son provided the following advise to help pt with CHF management:    Check daily weight pre-breakfast. Call if > 3 lbs weight gain in one day or 5 lb weight gain over a week.  Call physician if you have increased cough, shortness of breath increase leg swelling or chest tightness or pain.  Eat a low salt diet. Avoid excess salt. Your sodium intake should be less than 2000 milligrams per day.    NN will send information on CHF Self-Check Plan as well as on the heart healthy diet.    * Resources    Informed son about [facility].     Discussed advance care planning, i.e. caregiver services, senior living facilities, advance directives. Per son,pt does not need private duty caregiver at this time but was open to NN sending list of of HH aide agencies. Son made aware caregiver services are  not covered by insurance and was advised to call the [place] to see if they provide personal caregiver services. 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        Son said pt has medical advance directive. Highly encouraged son to bring copy to PCP visit tomorrow and have it scanned into pt's chart.             Pt's son agreed to receive educational and Estate manager/land agent by email. Pt verified that the email address on file  is correct. The following were emailed to the pt:  Rise Above Heart Failure/Self Check Plan- American Heart Association  Heart Health Plate- [email or web address].es  Mediterranean Diet- CallRank.dk  [facility]  List of Home health aide agencies in the area    Son said pt has no other CM need at this time. Requested for pt  or son to call NN for any care management questions or needs in between NN's outreaches. Pt's son verbalized understanding and appreciation for the support provided.     Follow-Up:    NN will continue to follow pt on the above topics and assess for other needs.       ADDENDUM 3 PM:     Spoke with Tiera to follow up. Possible start of care on Thursday, 3/1, but she is still waiting to hear back from a couple more PTs to see if they can do [facility] tomorrow instead. She will call the pt's son by end of day today. Son made aware.    Chart cc'd to PCP.       [id number] Vivien Presto RN, BSN, CCM, MBA  Patient Care Navigator  Ambulatory Care Management  [facility]  [phone]

## 2021-04-16 ENCOUNTER — Encounter (INDEPENDENT_AMBULATORY_CARE_PROVIDER_SITE_OTHER): Payer: Self-pay | Admitting: Student in an Organized Health Care Education/Training Program

## 2021-04-16 ENCOUNTER — Ambulatory Visit (INDEPENDENT_AMBULATORY_CARE_PROVIDER_SITE_OTHER): Payer: Medicare Other | Admitting: Student in an Organized Health Care Education/Training Program

## 2021-04-16 VITALS — BP 98/51 | HR 56 | Temp 97.8°F | Ht 66.0 in | Wt 150.0 lb

## 2021-04-16 DIAGNOSIS — Z23 Encounter for immunization: Secondary | ICD-10-CM

## 2021-04-16 DIAGNOSIS — I5022 Chronic systolic (congestive) heart failure: Secondary | ICD-10-CM

## 2021-04-16 DIAGNOSIS — I2609 Other pulmonary embolism with acute cor pulmonale: Secondary | ICD-10-CM

## 2021-04-16 NOTE — Progress Notes (Unsigned)
Subjective:      Patient ID: Rick [name] is a 88 y.o. male.    Chief Complaint:  Chief Complaint   Patient presents with    Hospital Follow-up     Pulmonary embolism with acute cor pulmonale, unspecified chronicity, unspecified pulmonary embolism type            HPI   Here for follow-up from recent hospitalization.   Inpatient facility: [facility]  Date of admission: February 21  Date of discharge: February 24  Discharge diagnosis: PE  Hospital course: Per hospitalist,  "Pulmonary embolism right lower lobe, suspected right heart strain  CTA chest: Segmental and subsegmental pulmonary emboli in the right lower lobe. Mild straightening of the interventricular septum as well as reflux of contrast into the intrahepatic IVC, suggesting right heart strain.   Vital signs per unit protocol  Supplemental oxygen to maintain SPO2 > 95%  Continuous cardiac and pulse ox monitoring  Continue heparin drip --> eliquis   troponin's trend negative  Echocardiogram below  Lower extremity venous duplex negative for dvt     Acute systolic heart failure    Last echo showed ef of  30 %.  Iv lasix and monitor --> lasix  Echo The left ventricular cavity size is dilated.  Left ventricular wall  thickness is normal.  Left ventricular systolic function is severely decreased  with an estimated ejection fraction of 25-30%.    * The right ventricular cavity size is upper limits of normal in size.  Mildly decreased right ventricular systolic function.    * Severe bi-atrial dilation.    * There is a 59mm Sapien 3 TAVR valve in aortic position that is normally  functioning.    * There is severe tricuspid regurgitation.  Moderate pulmonary hypertension  with estimated right ventricular systolic pressure of 54 mmHg.    * There is moderate pulmonic valve regurgitation.    * There is mild to moderate mitral regurgitation.    * Elevated right atrial pressure.    * Compared to the prior study dated 08/25/2019 (personally  reviewed),  tricuspid regurgitation increased. There are frequent PVC's throughout the  consult cardiology      Bradycardia   Off metoprolol        Hyperkalemia   Repeat potassium 4.1  9 beats of V. Tach  S/p Kayexalate     Urinary tract infection  Colonization of this issue infection  Treat with doxycycline     History dementia  Continue home medications  Continue follow-up management with outpatient provider(s)"      04/17/2021 -patient presents with his son for hospital follow-up.  His son reports patient is doing significantly better.  He is compliant with medication regimen.  No new dyspnea/cough/swelling.  No abnormal bleeding.  He has follow-up with cardiology.    Prior hospitalization within past 30 days: No   Prior hospitalization within past 6 months: No  Comorbid conditions:  CAD, CHF, dementia  Medical procedure(s) performed:  See hospital course  Surgical procedure(s) performed:  See hospital course  Diagnostic tests performed:  See hospital course  Pending tests: none  Home care: family assisting with home health needs  Specialist follow-up: hematology and pulmonology  Post-hospital monitoring: daily weights  Mobility status: wheelchair bound     Medication Reconciliation(required):  Home medications were reviewed and reconciled with current medication list within electronic medical record. Patient is adherent with current medication regimen.    Advanced Directives:  Has an Scientist, water quality.  A copy has been provided and is in chart.    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    Dry cough    Aortic valve stenosis, etiology of cardiac valve disease unspecified    S/P TAVR (transcatheter aortic valve replacement)    Difficulty in walking, not  elsewhere classified    Dementia without behavioral disturbance, unspecified dementia type    Balance disorder    Chronic HFrEF (heart failure with reduced ejection fraction)    Acute pulmonary embolism with acute cor pulmonale, unspecified pulmonary embolism type    Pulmonary embolism with acute cor pulmonale, unspecified chronicity, unspecified pulmonary embolism type       Outpatient Medications Marked as Taking for the 04/16/21 encounter (Office Visit) with Erasmo Score, MD   Medication Sig Dispense Refill    acetaminophen (TYLENOL) 325 MG tablet Take 2 tablets (650 mg total) by mouth every 4 (four) hours as needed for Pain      apixaban (ELIQUIS) 5 MG Take 1 tablet (5 mg) by mouth every 12 (twelve) hours 90 tablet 0    docusate sodium (COLACE) 100 MG capsule Take 1 capsule (100 mg) by mouth 2 (two) times daily      furosemide (LASIX) 40 MG tablet Take 1 tablet (40 mg) by mouth daily 60 tablet 0    memantine (NAMENDA) 10 MG tablet Take 2 tablets (20 mg total) by mouth every evening 180 tablet 0    Multiple Vitamin (multivitamin) capsule Take 1 capsule by mouth daily      rosuvastatin (CRESTOR) 20 MG tablet       sacubitril-valsartan (ENTRESTO) 24-26 MG Tab per tablet Take 1 tablet by mouth 2 (two) times daily 1/2 60 tablet 0    vitamin D (CHOLECALCIFEROL) 25 MCG (1000 UT) tablet Take 2 tablets (2,000 Units) by mouth Once every Monday, Wednesday and Friday morning      [DISCONTINUED] SUPPLEMENTS NOT FOUND IN DATABASE Take 1 tablet by mouth 2 (two) times daily Stop 3 days prior to TAVR   Super Beta plus         No Known Allergies    Past Medical History:   Diagnosis Date    Atrial enlargement, bilateral Echo result 04/04/19    CAD (coronary artery disease) 05/28/19 Cath results.    Severe 3V CAD.  Patent LIMA to LAD, patent SVG to OM 1 and OM 2 of circumflex CA, & patent SVG to RCA.    Chronic combined systolic and diastolic heart failure 310/21 OV Dr. Penny Pia    EF=30%    Dementia 04/2014    Difficulty walking 06/2016     Post surgery of lt knee, rt femur    Dyslipidemia 310/21 OV Dr. Penny Pia    Dyspnea on exertion 02/2018    HTN (hypertension) 310/21 OV Dr. Penny Pia    Mild cognitive impairment 310/21 OV Dr. Penny Pia    Primary cardiomyopathy 11/2017    RBBB 07/10/19 EKg result    S/P CABG (coronary artery bypass graft)      LIMA to LAD,  SVG to OM 1 & OM 2 of circumflex CA, & SVG to RCA.       Past Surgical History:   Procedure Laterality Date  CORONARY ARTERY BYPASS GRAFT  2003     approx year: LIMA to LAD,  SVG to OM 1 & OM 2 of circumflex CA, & SVG to RCA    FRACTURE SURGERY Right 06/2016    femur    PERCUTANEOUS VALVULOPLASTY - AORTIC N/A 07/12/2019    Procedure: TAVR #2;  Surgeon: Arlyss Queen, MD PhD;  Location: [facility];  Service: Cardiovascular;  Laterality: N/A;  23 HR OB    REPLACEMENT, AORTIC VALVE , EDWARDS SAPIEN N/A 07/12/2019    Procedure: REPLACEMENT, AORTIC VALVE , EDWARDS SAPIEN -- TAVR Valve:   S3 29mm -- Access: RTF -- IC: Dr. Penny Pia;  Surgeon: Christene Lye, MD;  Location: [facility] OR;  Service: Cardiothoracic;  Laterality: N/A;    RIGHT & LEFT HEART CATH POSSIBLE PCI N/A 05/26/2019    Procedure: RIGHT & LEFT HEART CATH POSSIBLE PCI;  Surgeon: Arlyss Queen, MD PhD;  Location: [facility];  Service: Cardiovascular;  Laterality: N/A;  [id number] scheduled with Maira in dr's office. OP. kr    TAVR PERCUTANEOUS FEMORAL  07/13/2019         TOTAL KNEE ARTHROPLASTY Left 2017    TOTAL KNEE ARTHROPLASTY Right 2005       Family History   Problem Relation Age of Onset    No known problems Mother     No known problems Father     No known problems Sister     No known problems Brother        Social History     Tobacco Use    Smoking status: Never    Smokeless tobacco: Never   Vaping Use    Vaping Use: Never used   Substance Use Topics    Alcohol use: Never    Drug use: Never         The following sections were reviewed this encounter by the provider:   Tobacco  Allergies  Meds  Problems  Med Hx   Surg Hx  Fam Hx          Review of Systems   Constitutional:  Negative for appetite change, chills, fatigue, fever and unexpected weight change.   HENT:  Negative for congestion, rhinorrhea and sore throat.    Eyes:  Negative for visual disturbance.   Respiratory:  Negative for cough and shortness of breath.    Cardiovascular:  Negative for chest pain and palpitations.   Gastrointestinal:  Negative for abdominal pain and blood in stool.   Endocrine: Negative for cold intolerance and heat intolerance.   Genitourinary:  Negative for difficulty urinating.   Musculoskeletal:  Negative for arthralgias.   Neurological:  Negative for dizziness, weakness, numbness and headaches.   Hematological:  Negative for adenopathy.   Psychiatric/Behavioral:  Negative for dysphoric mood. The patient is not nervous/anxious.    All other systems reviewed and are negative.    Vitals:  BP 98/51   Pulse (!) 56   Temp 97.8 F (36.6 C) (Tympanic)   Ht 1.676 m (5\' 6" )   Wt 68 kg (150 lb) Comment: verball by caregiver  SpO2 99%   BMI 24.21 kg/m     Objective:     Physical Exam  Constitutional:       Appearance: Normal appearance.   HENT:      Head: Normocephalic and atraumatic.      Nose: Nose normal.      Mouth/Throat:      Mouth: Mucous membranes are moist.  Pharynx: Oropharynx is clear.   Eyes:      Extraocular Movements: Extraocular movements intact.      Conjunctiva/sclera: Conjunctivae normal.      Pupils: Pupils are equal, round, and reactive to light.   Cardiovascular:      Rate and Rhythm: Normal rate and regular rhythm.      Pulses: Normal pulses.      Heart sounds: Normal heart sounds.   Pulmonary:      Effort: Pulmonary effort is normal.      Comments: Decreased breath sounds right lower lobe  Abdominal:      General: Abdomen is flat. Bowel sounds are normal.      Palpations: Abdomen is soft.   Musculoskeletal:         General: Normal range of motion.      Cervical back: Normal range of motion and neck supple.    Skin:     General: Skin is warm and dry.   Neurological:      General: No focal deficit present.      Mental Status: He is alert and oriented to person, place, and time.   Psychiatric:         Mood and Affect: Mood normal.         Behavior: Behavior normal.       Assessment:     1. Acute pulmonary embolism with acute cor pulmonale, unspecified pulmonary embolism type  -Continue AC with Eliquis, reviewed bleeding precautions, follow-up with hematology and pulmonology  - Ambulatory referral to Hematology / Oncology  - Ambulatory referral to Pulmonology    2. Chronic HFrEF (heart failure with reduced ejection fraction)  -Weight stable, no new dyspnea/edema, continue furosemide 40 mg daily, follow-up with cardiology    3. Need for vaccination for Strep pneumoniae  - Pneumococcal Conjugate 20 - Valent      Plan:   Disease/Illness Education:  Patient educated regarding symptom management, adherence to current medication regimen and current plan of care. Patient encouraged to contact us with any questions or needs.  Patient agreeable to current plan of care and verbalizes understanding of information provided.  Home Health Needs:  Home health care needs are being met by family.   Community Service Needs:  No community services needed at this time.  Establishment of Referral Orders:  New referrals entered into EMR - see orders section  Communication with Health Care Providers:  No active communication with additional health care providers needed at this time.  [place] 90+ day Treatment Plan:  Not applicable    Transitional Care Management Coding Criteria:  1) Communication with the patient and/or family was made within 2 business days of discharge.  2) Face-to-face visit occurred within 7 days of discharge.  3) Complexity of medical decision making is high.    Return in about 3 months (around 07/17/2021).    Erasmo ScoreSean Lidia Clavijo, MD

## 2021-04-16 NOTE — Progress Notes (Unsigned)
Have you seen any specialists/other providers since your last visit with [place]?    Yes, Hospital  Arm preference verified?   Yes, no preference    Health Maintenance Due   Topic Date Due    Annual Exam  Never done    Shingrix Vaccine 50+ (1) Never done    COVID-19 Vaccine (4 - Booster for Pfizer series) 02/19/2020    Pneumonia Vaccine Age 86+ (2 - PCV) 11/23/2020

## 2021-04-17 ENCOUNTER — Encounter (INDEPENDENT_AMBULATORY_CARE_PROVIDER_SITE_OTHER): Payer: Self-pay | Admitting: Student in an Organized Health Care Education/Training Program

## 2021-04-21 ENCOUNTER — Other Ambulatory Visit: Payer: Self-pay

## 2021-04-21 ENCOUNTER — Telehealth: Payer: Self-pay

## 2021-04-21 NOTE — Telephone Encounter (Signed)
Spoke to patients son referred for Dx PE. Offered sooner appt with other provider he declined. Patient is scheduled aware of address, contact info & visitor policy.

## 2021-04-21 NOTE — Progress Notes (Signed)
Ambulatory Care Management Progress Note    Pt was supposed to start [facility] PT/OT on 3/2 from [facility] [id number], [phone]). Received call from pt's son, Cayle II, who said they have not gotten a call at all from [facility]. Spoke with [place] of [facility]. She will see what happened and call NN back.     ADDENDUM 3:31 PM:     Barton Fanny who said one of their PT had a family emergency last week. Their patient coordinators already spoke with pt's son but there is no note on it yet so she does not have any additional information. Spoke with pt's son who said pt is scheduled for [facility] PT visit tomorrow, 3/7.    Requested for pt;s son to call NN for any care management questions or needs in between NN's outreaches. Son verbalized understanding and appreciation for the support provided. He said pt has no other CM need at this time. Will continue to follow.     [id number] Vivien Presto RN, BSN, CCM, MBA  Patient Care Navigator  Ambulatory Care Management  [facility]  [phone]

## 2021-04-27 ENCOUNTER — Inpatient Hospital Stay: Payer: Medicare Other

## 2021-04-27 ENCOUNTER — Emergency Department: Payer: Medicare Other

## 2021-04-27 ENCOUNTER — Inpatient Hospital Stay
Admission: EM | Admit: 2021-04-27 | Discharge: 2021-05-02 | DRG: 351 | Disposition: A | Discharge status: Home Health | Payer: Medicare Other | Source: Other Acute Inpatient Hospital | Attending: Internal Medicine | Admitting: Internal Medicine

## 2021-04-27 DIAGNOSIS — I4510 Unspecified right bundle-branch block: Secondary | ICD-10-CM | POA: Diagnosis present

## 2021-04-27 DIAGNOSIS — N170 Acute kidney failure with tubular necrosis: Secondary | ICD-10-CM | POA: Diagnosis present

## 2021-04-27 DIAGNOSIS — I252 Old myocardial infarction: Secondary | ICD-10-CM

## 2021-04-27 DIAGNOSIS — Z951 Presence of aortocoronary bypass graft: Secondary | ICD-10-CM

## 2021-04-27 DIAGNOSIS — Z79899 Other long term (current) drug therapy: Secondary | ICD-10-CM

## 2021-04-27 DIAGNOSIS — I5022 Chronic systolic (congestive) heart failure: Secondary | ICD-10-CM | POA: Diagnosis present

## 2021-04-27 DIAGNOSIS — Z7901 Long term (current) use of anticoagulants: Secondary | ICD-10-CM

## 2021-04-27 DIAGNOSIS — I2729 Other secondary pulmonary hypertension: Secondary | ICD-10-CM | POA: Diagnosis present

## 2021-04-27 DIAGNOSIS — I071 Rheumatic tricuspid insufficiency: Secondary | ICD-10-CM | POA: Diagnosis present

## 2021-04-27 DIAGNOSIS — Z20822 Contact with and (suspected) exposure to covid-19: Secondary | ICD-10-CM | POA: Diagnosis present

## 2021-04-27 DIAGNOSIS — I5042 Chronic combined systolic (congestive) and diastolic (congestive) heart failure: Secondary | ICD-10-CM | POA: Diagnosis present

## 2021-04-27 DIAGNOSIS — F0150 Vascular dementia without behavioral disturbance: Secondary | ICD-10-CM | POA: Diagnosis present

## 2021-04-27 DIAGNOSIS — E785 Hyperlipidemia, unspecified: Secondary | ICD-10-CM | POA: Diagnosis present

## 2021-04-27 DIAGNOSIS — Z96653 Presence of artificial knee joint, bilateral: Secondary | ICD-10-CM | POA: Diagnosis present

## 2021-04-27 DIAGNOSIS — D176 Benign lipomatous neoplasm of spermatic cord: Secondary | ICD-10-CM | POA: Diagnosis present

## 2021-04-27 DIAGNOSIS — Z952 Presence of prosthetic heart valve: Secondary | ICD-10-CM

## 2021-04-27 DIAGNOSIS — D696 Thrombocytopenia, unspecified: Secondary | ICD-10-CM | POA: Diagnosis not present

## 2021-04-27 DIAGNOSIS — Z86711 Personal history of pulmonary embolism: Secondary | ICD-10-CM

## 2021-04-27 DIAGNOSIS — I428 Other cardiomyopathies: Secondary | ICD-10-CM | POA: Diagnosis present

## 2021-04-27 DIAGNOSIS — I350 Nonrheumatic aortic (valve) stenosis: Secondary | ICD-10-CM | POA: Diagnosis present

## 2021-04-27 DIAGNOSIS — F0390 Unspecified dementia without behavioral disturbance: Secondary | ICD-10-CM

## 2021-04-27 DIAGNOSIS — R42 Dizziness and giddiness: Secondary | ICD-10-CM

## 2021-04-27 DIAGNOSIS — I2609 Other pulmonary embolism with acute cor pulmonale: Secondary | ICD-10-CM

## 2021-04-27 DIAGNOSIS — I110 Hypertensive heart disease with heart failure: Secondary | ICD-10-CM | POA: Diagnosis present

## 2021-04-27 DIAGNOSIS — I2510 Atherosclerotic heart disease of native coronary artery without angina pectoris: Secondary | ICD-10-CM | POA: Diagnosis present

## 2021-04-27 DIAGNOSIS — K4030 Unilateral inguinal hernia, with obstruction, without gangrene, not specified as recurrent: Principal | ICD-10-CM | POA: Diagnosis present

## 2021-04-27 LAB — CBC AND DIFFERENTIAL
Absolute NRBC: 0 10*3/uL (ref 0.00–0.00)
Basophils Absolute Automated: 0.02 10*3/uL (ref 0.00–0.08)
Basophils Automated: 0.5 %
Eosinophils Absolute Automated: 0.02 10*3/uL (ref 0.00–0.44)
Eosinophils Automated: 0.5 %
Hematocrit: 38.9 % (ref 37.6–49.6)
Hgb: 12.8 g/dL (ref 12.5–17.1)
Immature Granulocytes Absolute: 0.02 10*3/uL (ref 0.00–0.07)
Immature Granulocytes: 0.5 %
Instrument Absolute Neutrophil Count: 3.28 10*3/uL (ref 1.10–6.33)
Lymphocytes Absolute Automated: 0.59 10*3/uL (ref 0.42–3.22)
Lymphocytes Automated: 14 %
MCH: 34.3 pg — ABNORMAL HIGH (ref 25.1–33.5)
MCHC: 32.9 g/dL (ref 31.5–35.8)
MCV: 104.3 fL — ABNORMAL HIGH (ref 78.0–96.0)
MPV: 12.3 fL (ref 8.9–12.5)
Monocytes Absolute Automated: 0.29 10*3/uL (ref 0.21–0.85)
Monocytes: 6.9 %
Neutrophils Absolute: 3.28 10*3/uL (ref 1.10–6.33)
Neutrophils: 77.6 %
Nucleated RBC: 0 /100 WBC (ref 0.0–0.0)
Platelets: 83 10*3/uL — ABNORMAL LOW (ref 142–346)
RBC: 3.73 10*6/uL — ABNORMAL LOW (ref 4.20–5.90)
RDW: 13 % (ref 11–15)
WBC: 4.22 10*3/uL (ref 3.10–9.50)

## 2021-04-27 LAB — COMPREHENSIVE METABOLIC PANEL
ALT: 16 U/L (ref 0–55)
AST (SGOT): 26 U/L (ref 5–41)
Albumin/Globulin Ratio: 1 (ref 0.9–2.2)
Albumin: 3.9 g/dL (ref 3.5–5.0)
Alkaline Phosphatase: 69 U/L (ref 37–117)
Anion Gap: 11 (ref 5.0–15.0)
BUN: 40 mg/dL — ABNORMAL HIGH (ref 9.0–28.0)
Bilirubin, Total: 0.7 mg/dL (ref 0.2–1.2)
CO2: 25 mEq/L (ref 17–29)
Calcium: 10 mg/dL (ref 7.9–10.2)
Chloride: 105 mEq/L (ref 99–111)
Creatinine: 1.4 mg/dL (ref 0.5–1.5)
Globulin: 4 g/dL — ABNORMAL HIGH (ref 2.0–3.6)
Glucose: 143 mg/dL — ABNORMAL HIGH (ref 70–100)
Potassium: 4.6 mEq/L (ref 3.5–5.3)
Protein, Total: 7.9 g/dL (ref 6.0–8.3)
Sodium: 141 mEq/L (ref 135–145)

## 2021-04-27 LAB — URINALYSIS REFLEX TO MICROSCOPIC EXAM - REFLEX TO CULTURE
Bilirubin, UA: NEGATIVE
Glucose, UA: NEGATIVE
Ketones UA: NEGATIVE
Leukocyte Esterase, UA: NEGATIVE
Nitrite, UA: NEGATIVE
Specific Gravity UA: 1.025 (ref 1.001–1.035)
Urine pH: 6.5 (ref 5.0–8.0)
Urobilinogen, UA: 0.2 mg/dL (ref 0.2–2.0)

## 2021-04-27 LAB — HIGH SENSITIVITY TROPONIN-I: hs Troponin-I: 17.6 ng/L

## 2021-04-27 LAB — COVID-19 (SARS-COV-2): SARS CoV 2 Overall Result: NOT DETECTED

## 2021-04-27 LAB — GFR: EGFR: 57

## 2021-04-27 LAB — LIPASE: Lipase: 22 U/L (ref 8–78)

## 2021-04-27 LAB — PROBNP: NT-proBNP: 1530 pg/mL — ABNORMAL HIGH (ref 0–450)

## 2021-04-27 MED ORDER — POTASSIUM CHLORIDE CRYS ER 10 MEQ PO TBCR
0.0000 meq | EXTENDED_RELEASE_TABLET | ORAL | Status: DC | PRN
Start: 2021-04-27 — End: 2021-05-02

## 2021-04-27 MED ORDER — ONDANSETRON 4 MG PO TBDP
4.0000 mg | ORAL_TABLET | ORAL | Status: DC | PRN
Start: 2021-04-27 — End: 2021-04-28

## 2021-04-27 MED ORDER — ACETAMINOPHEN 325 MG PO TABS
650.0000 mg | ORAL_TABLET | Freq: Four times a day (QID) | ORAL | Status: DC | PRN
Start: 2021-04-27 — End: 2021-04-27

## 2021-04-27 MED ORDER — GLUCAGON 1 MG IJ SOLR (WRAP)
1.0000 mg | INTRAMUSCULAR | Status: DC | PRN
Start: 2021-04-27 — End: 2021-05-02

## 2021-04-27 MED ORDER — SODIUM CHLORIDE 0.9 % IV SOLN
INTRAVENOUS | Status: DC
Start: 2021-04-27 — End: 2021-04-27

## 2021-04-27 MED ORDER — POTASSIUM CHLORIDE 10 MEQ/100ML IV SOLN
10.0000 meq | INTRAVENOUS | Status: DC | PRN
Start: 2021-04-27 — End: 2021-05-02

## 2021-04-27 MED ORDER — ONDANSETRON HCL 4 MG/2ML IJ SOLN
4.0000 mg | INTRAMUSCULAR | Status: DC | PRN
Start: 2021-04-27 — End: 2021-04-28

## 2021-04-27 MED ORDER — MORPHINE SULFATE 4 MG/ML IJ/IV SOLN (WRAP)
4.0000 mg | Freq: Once | Status: AC
Start: 2021-04-27 — End: 2021-04-27
  Administered 2021-04-27: 4 mg via INTRAVENOUS
  Filled 2021-04-27: qty 1

## 2021-04-27 MED ORDER — POTASSIUM & SODIUM PHOSPHATES 280-160-250 MG PO PACK
2.0000 | PACK | ORAL | Status: DC | PRN
Start: 2021-04-27 — End: 2021-05-02

## 2021-04-27 MED ORDER — DEXTROSE 50 % IV SOLN
12.5000 g | INTRAVENOUS | Status: DC | PRN
Start: 2021-04-27 — End: 2021-05-02

## 2021-04-27 MED ORDER — ACETAMINOPHEN 650 MG RE SUPP
650.0000 mg | Freq: Four times a day (QID) | RECTAL | Status: DC | PRN
Start: 2021-04-27 — End: 2021-04-30

## 2021-04-27 MED ORDER — DEXTROSE 10 % IV BOLUS
12.5000 g | INTRAVENOUS | Status: DC | PRN
Start: 2021-04-27 — End: 2021-05-02

## 2021-04-27 MED ORDER — ACETAMINOPHEN 325 MG PO TABS
650.0000 mg | ORAL_TABLET | Freq: Four times a day (QID) | ORAL | Status: DC | PRN
Start: 2021-04-27 — End: 2021-04-30

## 2021-04-27 MED ORDER — NALOXONE HCL 0.4 MG/ML IJ SOLN (WRAP)
0.2000 mg | INTRAMUSCULAR | Status: DC | PRN
Start: 2021-04-27 — End: 2021-05-02

## 2021-04-27 MED ORDER — MORPHINE SULFATE 2 MG/ML IJ/IV SOLN (WRAP)
2.0000 mg | Status: AC | PRN
Start: 2021-04-27 — End: 2021-04-28

## 2021-04-27 MED ORDER — MAGNESIUM SULFATE IN D5W 1-5 GM/100ML-% IV SOLN
1.0000 g | INTRAVENOUS | Status: DC | PRN
Start: 2021-04-27 — End: 2021-05-02

## 2021-04-27 MED ORDER — GLUCOSE 40 % PO GEL (WRAP)
15.0000 g | ORAL | Status: DC | PRN
Start: 2021-04-27 — End: 2021-05-02

## 2021-04-27 MED ORDER — ONDANSETRON HCL 4 MG/2ML IJ SOLN
4.0000 mg | Freq: Once | INTRAMUSCULAR | Status: AC
Start: 2021-04-27 — End: 2021-04-27
  Administered 2021-04-27: 4 mg via INTRAVENOUS
  Filled 2021-04-27: qty 2

## 2021-04-27 NOTE — ED Provider Notes (Signed)
EMERGENCY DEPARTMENT HISTORY AND PHYSICAL EXAM     None        Date: 04/27/2021  Patient Name: Rick [name] [facility]    History of Presenting Illness     Chief Complaint   Patient presents with    Groin Pain       History Provided By: patient and son    History: Rick [name] is a 88 y.o. male presenting to the ED with severe, constant, aching bulge in the right groin area that patient noticed earlier today.  No nausea or vomiting.  He had breakfast today but has not eaten anything since then.  Symptoms are exacerbated by palpation of the bulge.  No alleviating factors.    No history of abdominal surgery.    History of PE on Eliquis, congestive heart failure, dementia, TAVR.    Full code.    PCP: Erasmo Score, MD  SPECIALISTS:    No current facility-administered medications for this encounter.     Current Outpatient Medications   Medication Sig Dispense Refill    acetaminophen (TYLENOL) 325 MG tablet Take 2 tablets (650 mg total) by mouth every 4 (four) hours as needed for Pain      apixaban (ELIQUIS) 5 MG Take 1 tablet (5 mg) by mouth every 12 (twelve) hours 90 tablet 0    docusate sodium (COLACE) 100 MG capsule Take 1 capsule (100 mg) by mouth 2 (two) times daily      furosemide (LASIX) 40 MG tablet Take 1 tablet (40 mg) by mouth daily 60 tablet 0    memantine (NAMENDA) 10 MG tablet Take 2 tablets (20 mg total) by mouth every evening 180 tablet 0    Multiple Vitamin (multivitamin) capsule Take 1 capsule by mouth daily      rosuvastatin (CRESTOR) 20 MG tablet       sacubitril-valsartan (ENTRESTO) 24-26 MG Tab per tablet Take 1 tablet by mouth 2 (two) times daily 1/2 60 tablet 0    vitamin D (CHOLECALCIFEROL) 25 MCG (1000 UT) tablet Take 2 tablets (2,000 Units) by mouth Once every Monday, Wednesday and Friday morning         Past History     Past Medical History:  Past Medical History:   Diagnosis Date    Atrial enlargement, bilateral Echo result 04/04/19    CAD (coronary artery disease) 05/28/19 Cath  results.    Severe 3V CAD.  Patent LIMA to LAD, patent SVG to OM 1 and OM 2 of circumflex CA, & patent SVG to RCA.    Chronic combined systolic and diastolic heart failure 310/21 OV Dr. Penny Pia    EF=30%    Dementia 04/2014    Difficulty walking 06/2016    Post surgery of lt knee, rt femur    Dyslipidemia 310/21 OV Dr. Penny Pia    Dyspnea on exertion 02/2018    HTN (hypertension) 310/21 OV Dr. Penny Pia    Mild cognitive impairment 310/21 OV Dr. Penny Pia    Primary cardiomyopathy 11/2017    RBBB 07/10/19 EKg result    S/P CABG (coronary artery bypass graft)      LIMA to LAD,  SVG to OM 1 & OM 2 of circumflex CA, & SVG to RCA.       Past Surgical History:  Past Surgical History:   Procedure Laterality Date    CORONARY ARTERY BYPASS GRAFT  2003     approx year: LIMA to LAD,  SVG to OM 1 & OM 2 of circumflex CA, &  SVG to RCA    FRACTURE SURGERY Right 06/2016    femur    PERCUTANEOUS VALVULOPLASTY - AORTIC N/A 07/12/2019    Procedure: TAVR #2;  Surgeon: Arlyss Queen, MD PhD;  Location: [facility];  Service: Cardiovascular;  Laterality: N/A;  23 HR OB    REPLACEMENT, AORTIC VALVE , EDWARDS SAPIEN N/A 07/12/2019    Procedure: REPLACEMENT, AORTIC VALVE , EDWARDS SAPIEN -- TAVR Valve:   S3 29mm -- Access: RTF -- IC: Dr. Penny Pia;  Surgeon: Christene Lye, MD;  Location: [facility] HEART OR;  Service: Cardiothoracic;  Laterality: N/A;    RIGHT & LEFT HEART CATH POSSIBLE PCI N/A 05/26/2019    Procedure: RIGHT & LEFT HEART CATH POSSIBLE PCI;  Surgeon: Arlyss Queen, MD PhD;  Location: [facility];  Service: Cardiovascular;  Laterality: N/A;  [id number] scheduled with Maira in dr's office. OP. kr    TAVR PERCUTANEOUS FEMORAL  07/13/2019         TOTAL KNEE ARTHROPLASTY Left 2017    TOTAL KNEE ARTHROPLASTY Right 2005       Family History:  Family History   Problem Relation Age of Onset    No known problems Mother     No known problems Father     No known problems Sister     No known problems Brother        Social  History:  Social History     Tobacco Use    Smoking status: Never    Smokeless tobacco: Never   Vaping Use    Vaping Use: Never used   Substance Use Topics    Alcohol use: Never    Drug use: Never       Allergies:  No Known Allergies    Review of Systems     Review of Systems: All other systems reviewed and negative.         Physical Exam   BP 119/75   Pulse 92   Temp 98.5 F (36.9 C)   Resp 18   SpO2 95%     Constitutional: Vital signs reviewed. Frail appearing. Appears uncomfortable  Head: Normocephalic, atraumatic  Eyes: Conjunctiva and sclera are normal.  No injection or discharge.  Ears, Nose, Throat:  Normal external examination of the nose and ears. No throat or oropharyngeal swelling or erythema. Midline uvula. Mucous membranes moist.  Neck: Normal range of motion. Supple, no meningeal signs. Trachea midline. No stridor. No JVD  Respiratory/Chest: Clear to auscultation. No respiratory distress.   Cardiovascular: Regular rate and rhythm. No murmurs.  Abdomen:  Bowel sounds intact. No rebound or guarding. Soft.  Non-tender.  GU: Large exquisitely tender irreducible right inguinal hernia.  Back: No CVA tenderness to percussion. No focal tenderness.  Upper Extremity:  No edema. No cyanosis. Bilateral radial pulses intact and equal.   Lower Extremity:  No edema. No cyanosis. Bilateral calves symmetrical and non-tender. Bilateral  DP, PT pulses intact and equal.  Skin: Warm and dry. No rash.  Neuro: Cranial nerves grossly intact.  Moves all extremities spontaneously.  Psychiatric: Normal affect. Limited insight.      Diagnostic Study Results     Labs -     Results       Procedure Component Value Units Date/Time    NT-proBNP [[id number]]  (Abnormal) Collected: 04/27/21 1814     Updated: 04/27/21 1909     NT-proBNP 1,530 pg/mL     High Sensitivity Troponin-I at 0 hrs [[id number]] Collected: 04/27/21 1814  Specimen: Blood Updated: 04/27/21 1857     hs Troponin-I 17.6 ng/L     Urinalysis Reflex to Microscopic  Exam- Reflex to Culture [[id number]]  (Abnormal) Collected: 04/27/21 1822    Specimen: Urine, Clean Catch Updated: 04/27/21 1855     Urine Type Urine, Clean Ca     Color, UA Yellow     Clarity, UA Clear     Specific Gravity UA 1.025     Urine pH 6.5     Leukocyte Esterase, UA Negative     Nitrite, UA Negative     Protein, UR Trace     Glucose, UA Negative     Ketones UA Negative     Urobilinogen, UA 0.2 mg/dL      Bilirubin, UA Negative     Blood, UA Trace-lysed     RBC, UA 3 - 5 /hpf      WBC, UA 0-5 /hpf      Squamous Epithelial Cells, Urine 0-5 /hpf     Lipase [[id number]] Collected: 04/27/21 1814    Specimen: Blood Updated: 04/27/21 1852     Lipase 22 U/L     GFR [[id number]] Collected: 04/27/21 1814     Updated: 04/27/21 1852     EGFR 57.0    Comprehensive metabolic panel [[id number]]  (Abnormal) Collected: 04/27/21 1814    Specimen: Blood Updated: 04/27/21 1852     Glucose 143 mg/dL      BUN 44.0 mg/dL      Creatinine 1.4 mg/dL      Sodium 347 mEq/L      Potassium 4.6 mEq/L      Chloride 105 mEq/L      CO2 25 mEq/L      Calcium 10.0 mg/dL      Protein, Total 7.9 g/dL      Albumin 3.9 g/dL      AST (SGOT) 26 U/L      ALT 16 U/L      Alkaline Phosphatase 69 U/L      Bilirubin, Total 0.7 mg/dL      Globulin 4.0 g/dL      Albumin/Globulin Ratio 1.0     Anion Gap 11.0    CBC and differential [[id number]]  (Abnormal) Collected: 04/27/21 1814    Specimen: Blood Updated: 04/27/21 1847     WBC 4.22 x10 3/uL      Hgb 12.8 g/dL      Hematocrit 56.4 %      Platelets 83 x10 3/uL      RBC 3.73 x10 6/uL      MCV 104.3 fL      MCH 34.3 pg      MCHC 32.9 g/dL      RDW 13 %      MPV 12.3 fL      Instrument Absolute Neutrophil Count 3.28 x10 3/uL      Neutrophils 77.6 %      Lymphocytes Automated 14.0 %      Monocytes 6.9 %      Eosinophils Automated 0.5 %      Basophils Automated 0.5 %      Immature Granulocytes 0.5 %      Nucleated RBC 0.0 /100 WBC      Neutrophils Absolute 3.28 x10 3/uL      Lymphocytes Absolute Automated 0.59 x10  3/uL      Monocytes Absolute Automated 0.29 x10 3/uL      Eosinophils Absolute Automated 0.02 x10 3/uL      Basophils Absolute  Automated 0.02 x10 3/uL      Immature Granulocytes Absolute 0.02 x10 3/uL      Absolute NRBC 0.00 x10 3/uL             Radiologic Studies -   Radiology Results (24 Hour)       Procedure Component Value Units Date/Time    CT Abd/Pelvis without Contrast [[id number]] Collected: 04/27/21 1921    Order Status: Completed Updated: 04/27/21 1934    Narrative:      HISTORY: Right lower abdominal pain and fatigue. Chronic renal disease.    EXAMINATION AND TECHNIQUE: Spiral low dose modulated unenhanced CT of  abdomen and pelvis presented for evaluation. 2.5 mm axial images were  obtained.    Note that CT scanning at this site utilizes multiple dose reduction  techniques including automatic exposure control, adjustment of the MAS  and/or KVP according to patient size, and use of iterative  reconstruction technique.    COMPARISON: CT 06/16/2019    FINDINGS: Visualized lung bases exhibit atelectasis, right greater than  left. The unenhanced liver, spleen, adrenal glands, and pancreas are  intact. Gallbladder present. The kidneys exhibit no hydronephrosis. No  ascites in the upper abdomen. No bulky adenopathy. Abdominal aorta  normal in caliber. The abdominal aorta is heavily calcified but not  aneurysmal in caliber. Evaluation of the intestinal system reveals  gastric distention and dilated loops of small bowel with transition zone  at the site of a right-sided inguinal hernia. No CT evidence of free air  or abscess at this time. There is constipation of the colon. Evaluation  of the osseous structures reveal degenerative in the hips and spine.  There is an intramedullary rod in the proximal metaphysis of the right  femur. Redemonstration of sclerosis involving the femoral heads  bilaterally suggestive of possible early avascular necrosis. However, no  collapse is noted of the femoral heads.    The pelvis  contains no free fluid or bulky adenopathy. The bladder is  intact. There are surgical clips at the prostate bed.      Impression:         1. Small bowel obstruction with transition point at site of new  right-sided inguinal hernia.  2. Remainder as above.    Ivin Poot, MD  04/27/2021 7:32 PM        .    Medical Decision Making   I am the first provider for this patient.    I reviewed the vital signs, available nursing notes, past medical history, past surgical history, family history and social history.    Vital Signs-Reviewed the patient's vital signs.   Patient Vitals for the past 12 hrs:   BP Temp Pulse Resp   04/27/21 1804 119/75 -- 92 18   04/27/21 1743 (!) 222/90 98.5 F (36.9 C) 90 18         EKG:  Interpreted by the EP.   Time Interpreted: 550   Rate: 95   Rhythm: unclear rhythm.     Interpretation: frequent PVC. No STEMI   Comparison: 04/10/21--increased PVC frequency         ED Course:     743 - d/aw Dr. Allena Katz, gen surg on call, NG tube, admit to medicine, likely surgery later tonight.       745 -reviewed all results and impressions, general surgery recommendations, and high risk of complicated course with patient's son and patient.  They voiced understanding and agreement with plan for transfer to [facility] soon  as possible.    752-discussed case with Dr. Melvyn Neth from [facility] emergency department who accepts patient for ED to ED transfer.    800 - discussed case with Dr. Dan Humphreys, Sound on call    810 - discussed case with Dr. Velda Shell, ICU on call          Provider Notes: Patient presenting to the ED with incarcerated right inguinal hernia.  Multiple medical problems include bilateral PE on Eliquis, congestive heart failure, TAVR, dementia.  He is full code.  Discussed with patient's son as above. Patient was transferred to [facility] for further management.      CRITICAL CARE: The high probability of sudden, clinically significant deterioration in the patient's condition required the highest level  of my preparedness to intervene urgently.    The services I provided to this patient were to treat and/or prevent clinically significant deterioration that could result in: death.  Services included the following: chart data review, reviewing nursing notes and/or old charts, documentation time, consultant collaboration regarding findings and treatment options, medication orders and management, direct patient care, re-evaluations, vital sign assessments and ordering, interpreting and reviewing diagnostic studies/lab tests.    Aggregate critical care time was 75 minutes, which includes only time during which I was engaged in work directly related to the patient's care, as described above, whether at the bedside or elsewhere in the Emergency Department.  It did not include time spent performing other reported procedures or the services of residents, students, nurses or physician assistants.      Diagnosis     Clinical Impression:   1. Incarcerated inguinal hernia        Treatment Plan:   ED Disposition       ED Disposition   Transfer to [facility] ED    Condition   --    Date/Time   Sun Apr 27, 2021  7:52 PM    Comment   Accepting: Dr. Melvyn Neth                 _______________________________    This note was generated by the Epic EMR system/ Dragon speech recognition and may contain inherent errors or omissions not intended by the user. Grammatical errors, random word insertions, deletions and pronoun errors  are occasional consequences of this technology due to software limitations. Not all errors are caught or corrected. If there are questions or concerns about the content of this note or information contained within the body of this dictation they should be addressed directly with the author for clarification.      Attestations: This note is prepared by Lynnea Ferrier, MD    _______________________________       Maryella Shivers, MD  04/27/21 2222

## 2021-04-27 NOTE — ED Notes (Signed)
No wrist band was found on the patient to identify proper ID prior to covid swab. Wrist band placed on the patient and then swabbed for covid. Confirmed ID was right patient.

## 2021-04-27 NOTE — H&P (Signed)
[facility]      Patient: Rick [name]  Date: 04/27/2021   DOB: [birth year]  Admission Date: 04/27/2021   MRN: [id number]  Attending: Alecia Lemming, MD         Chief Complaint   Patient presents with    Groin Pain      History Gathered From: Patient and family    HISTORY AND PHYSICAL     Rick [name] is a 88 y.o. male with a PMHx of CAD status post CABG, cardiomyopathy, combined systolic and diastolic congestive heart failure, hypertension, dyslipidemia, dementia who was last admitted on 04/09/2021 with acute pulmonary embolism with cor pulmonale and currently on Eliquis presented to the ED after he was transferred from [facility] right inguinal hernia with severe pain.  Patient was evaluated by Dr. Allena Katz and his hernia reduced.     Patient reported that he started to have right lower abdominal pain since earlier today.  He denies vomiting.  He had a bowel movement yesterday and he is passing gas.  He denies chest pain, cough, shortness of breath.  He denies urinary symptoms.  He currently denies abdominal pain.    In the ED, BP 222/90 with pulse rate of 90 and temperature of 98.5 saturating 95%.  His lab work revealed WBC of 4.2, hemoglobin 12.8, creatinine 1.4, troponin 17.6 and proBNP of 1530.  UA negative for nitrite and leukocyte esterase.  CT A/P showed small bowel obstruction with transition point at site of new  right-sided inguinal hernia.  Initial chest x-ray showed feeding tube noted with distal tip suspected to be in a right lower lobe bronchus [NG tube was removed out by the ED].  LVEF 25 to 30% on echo on 04/09/2021.  He received morphine in the ED.  Hospitalist service consulted for further evaluation and management.        Past Medical History:   Diagnosis Date    Atrial enlargement, bilateral Echo result 04/04/19    CAD (coronary artery disease) 05/28/19 Cath results.    Severe 3V CAD.  Patent LIMA to LAD, patent SVG to OM 1 and OM 2 of circumflex CA, & patent SVG to RCA.     Chronic combined systolic and diastolic heart failure 310/21 OV Dr. Penny Pia    EF=30%    Dementia 04/2014    Difficulty walking 06/2016    Post surgery of lt knee, rt femur    Dyslipidemia 310/21 OV Dr. Penny Pia    Dyspnea on exertion 02/2018    HTN (hypertension) 310/21 OV Dr. Penny Pia    Mild cognitive impairment 310/21 OV Dr. Penny Pia    Primary cardiomyopathy 11/2017    RBBB 07/10/19 EKg result    S/P CABG (coronary artery bypass graft)      LIMA to LAD,  SVG to OM 1 & OM 2 of circumflex CA, & SVG to RCA.       Past Surgical History:   Procedure Laterality Date    CORONARY ARTERY BYPASS GRAFT  2003     approx year: LIMA to LAD,  SVG to OM 1 & OM 2 of circumflex CA, & SVG to RCA    FRACTURE SURGERY Right 06/2016    femur    PERCUTANEOUS VALVULOPLASTY - AORTIC N/A 07/12/2019    Procedure: TAVR #2;  Surgeon: Arlyss Queen, MD PhD;  Location: [facility];  Service: Cardiovascular;  Laterality: N/A;  23 HR OB    REPLACEMENT, AORTIC VALVE , EDWARDS SAPIEN N/A 07/12/2019  Procedure: REPLACEMENT, AORTIC VALVE , EDWARDS SAPIEN -- TAVR Valve:   S3 29mm -- Access: RTF -- IC: Dr. Penny Pia;  Surgeon: Christene Lye, MD;  Location: [facility];  Service: Cardiothoracic;  Laterality: N/A;    RIGHT & LEFT HEART CATH POSSIBLE PCI N/A 05/26/2019    Procedure: RIGHT & LEFT HEART CATH POSSIBLE PCI;  Surgeon: Arlyss Queen, MD PhD;  Location: [facility];  Service: Cardiovascular;  Laterality: N/A;  [id number] scheduled with Maira in dr's office. OP. kr    TAVR PERCUTANEOUS FEMORAL  07/13/2019         TOTAL KNEE ARTHROPLASTY Left 2017    TOTAL KNEE ARTHROPLASTY Right 2005       Prior to Admission medications    Medication Sig Start Date End Date Taking? Authorizing Provider   acetaminophen (TYLENOL) 325 MG tablet Take 2 tablets (650 mg total) by mouth every 4 (four) hours as needed for Pain 07/13/19   Bowman, Cristin A, NP   apixaban (ELIQUIS) 5 MG Take 1 tablet (5 mg) by mouth every 12 (twelve) hours 04/16/21    Majel Homer, MD   docusate sodium (COLACE) 100 MG capsule Take 1 capsule (100 mg) by mouth 2 (two) times daily    [provider]   furosemide (LASIX) 40 MG tablet Take 1 tablet (40 mg) by mouth daily 04/12/21   Majel Homer, MD   memantine (NAMENDA) 10 MG tablet Take 2 tablets (20 mg total) by mouth every evening 10/17/19   Erasmo Score, MD   Multiple Vitamin (multivitamin) capsule Take 1 capsule by mouth daily    [provider]   rosuvastatin (CRESTOR) 20 MG tablet  11/28/19   [provider]   sacubitril-valsartan (ENTRESTO) 24-26 MG Tab per tablet Take 1 tablet by mouth 2 (two) times daily 1/2 04/11/21   Majel Homer, MD   vitamin D (CHOLECALCIFEROL) 25 MCG (1000 UT) tablet Take 2 tablets (2,000 Units) by mouth Once every Monday, Wednesday and Friday morning    [provider]       No Known Allergies    CODE STATUS: Full code    PRIMARY CARE MD: Erasmo Score, MD    Family History   Problem Relation Age of Onset    No known problems Mother     No known problems Father     No known problems Sister     No known problems Brother        Social History     Tobacco Use    Smoking status: Never    Smokeless tobacco: Never   Vaping Use    Vaping Use: Never used   Substance Use Topics    Alcohol use: Never    Drug use: Never       REVIEW OF SYSTEMS   Positive for: Per HPI  Negative for: Per HPI  All ROS completed and otherwise negative.    PHYSICAL EXAM     Vital Signs (most recent): BP 154/65   Pulse (!) 58   Temp 98.4 F (36.9 C)   Resp 14   SpO2 99%   Constitutional: No apparent distress. Patient speaks freely in full sentences.   HEENT: NC/AT, PERRL, no scleral icterus or conjunctival pallor, no nasal discharge, MMM, oropharynx without erythema or exudate  Neck: trachea midline, supple, no cervical or supraclavicular lymphadenopathy or masses  Cardiovascular: RRR, normal S1 S2, no murmurs, gallops, palpable thrills, no JVD, Non-displaced PMI.  Respiratory:  Normal rate. No  retractions or increased work of breathing. Clear to auscultation and percussion bilaterally.  Gastrointestinal: +BS, non-distended, soft, non-tender, no rebound or guarding, no hepatosplenomegaly  Genitourinary: no suprapubic or costovertebral angle tenderness  Musculoskeletal: ROM and motor strength grossly normal. No clubbing, edema, or cyanosis. DP and radial pulses 2+ and symmetric.  Skin exam:  pink  Neurologic: EOMI, CN 2-12 grossly intact. no gross motor or sensory deficits  Psychiatric: AAOx3, affect and mood appropriate. The patient is alert, interactive, appropriate.  Capillary refill:  Normal    Exam done by Alecia Lemming, MD on 04/27/21 at 9:26 PM      LABS & IMAGING     Recent Results (from the past 24 hour(s))   CBC and differential    Collection Time: 04/27/21  6:14 PM   Result Value Ref Range    WBC 4.22 3.10 - 9.50 x10 3/uL    Hgb 12.8 12.5 - 17.1 g/dL    Hematocrit 16.1 09.6 - 49.6 %    Platelets 83 (L) 142 - 346 x10 3/uL    RBC 3.73 (L) 4.20 - 5.90 x10 6/uL    MCV 104.3 (H) 78.0 - 96.0 fL    MCH 34.3 (H) 25.1 - 33.5 pg    MCHC 32.9 31.5 - 35.8 g/dL    RDW 13 11 - 15 %    MPV 12.3 8.9 - 12.5 fL    Instrument Absolute Neutrophil Count 3.28 1.10 - 6.33 x10 3/uL    Neutrophils 77.6 None %    Lymphocytes Automated 14.0 None %    Monocytes 6.9 None %    Eosinophils Automated 0.5 None %    Basophils Automated 0.5 None %    Immature Granulocytes 0.5 None %    Nucleated RBC 0.0 0.0 - 0.0 /100 WBC    Neutrophils Absolute 3.28 1.10 - 6.33 x10 3/uL    Lymphocytes Absolute Automated 0.59 0.42 - 3.22 x10 3/uL    Monocytes Absolute Automated 0.29 0.21 - 0.85 x10 3/uL    Eosinophils Absolute Automated 0.02 0.00 - 0.44 x10 3/uL    Basophils Absolute Automated 0.02 0.00 - 0.08 x10 3/uL    Immature Granulocytes Absolute 0.02 0.00 - 0.07 x10 3/uL    Absolute NRBC 0.00 0.00 - 0.00 x10 3/uL   Comprehensive metabolic panel    Collection Time: 04/27/21  6:14 PM   Result Value Ref Range    Glucose 143  (H) 70 - 100 mg/dL    BUN 04.5 (H) 9.0 - 40.9 mg/dL    Creatinine 1.4 0.5 - 1.5 mg/dL    Sodium 811 914 - 782 mEq/L    Potassium 4.6 3.5 - 5.3 mEq/L    Chloride 105 99 - 111 mEq/L    CO2 25 17 - 29 mEq/L    Calcium 10.0 7.9 - 10.2 mg/dL    Protein, Total 7.9 6.0 - 8.3 g/dL    Albumin 3.9 3.5 - 5.0 g/dL    AST (SGOT) 26 5 - 41 U/L    ALT 16 0 - 55 U/L    Alkaline Phosphatase 69 37 - 117 U/L    Bilirubin, Total 0.7 0.2 - 1.2 mg/dL    Globulin 4.0 (H) 2.0 - 3.6 g/dL    Albumin/Globulin Ratio 1.0 0.9 - 2.2    Anion Gap 11.0 5.0 - 15.0   High Sensitivity Troponin-I at 0 hrs    Collection Time: 04/27/21  6:14 PM   Result Value Ref Range    hs Troponin-I  17.6 SEE BELOW ng/L   Lipase    Collection Time: 04/27/21  6:14 PM   Result Value Ref Range    Lipase 22 8 - 78 U/L   NT-proBNP    Collection Time: 04/27/21  6:14 PM   Result Value Ref Range    NT-proBNP 1,530 (H) 0 - 450 pg/mL   GFR    Collection Time: 04/27/21  6:14 PM   Result Value Ref Range    EGFR 57.0    Urinalysis Reflex to Microscopic Exam- Reflex to Culture    Collection Time: 04/27/21  6:22 PM    Specimen: Urine, Clean Catch   Result Value Ref Range    Urine Type Urine, Clean Ca     Color, UA Yellow Colorless - Yellow    Clarity, UA Clear Clear - Hazy    Specific Gravity UA 1.025 1.001 - 1.035    Urine pH 6.5 5.0 - 8.0    Leukocyte Esterase, UA Negative Negative    Nitrite, UA Negative Negative    Protein, UR Trace (A) Negative    Glucose, UA Negative Negative    Ketones UA Negative Negative    Urobilinogen, UA 0.2 0.2 - 2.0 mg/dL    Bilirubin, UA Negative Negative    Blood, UA Trace-lysed Negative    RBC, UA 3 - 5 0 - 5 /hpf    WBC, UA 0-5 0 - 5 /hpf    Squamous Epithelial Cells, Urine 0-5 0 - 25 /hpf       MICROBIOLOGY:  Blood Culture: No  Urine Culture: No  Antibiotics Started: No    IMAGING:  Upon my review: Yes    CARDIAC:  EKG Interpretation (upon my review): Undetermined rhythm with ST abnormalities.    Markers:  Recent Labs   Lab 04/27/21  1814   hs  Troponin-I 17.6       EMERGENCY DEPARTMENT COURSE:  Orders Placed This Encounter   Procedures    CT Abd/Pelvis without Contrast    Chest AP Portable    CBC and differential    Comprehensive metabolic panel    Urinalysis Reflex to Microscopic Exam- Reflex to Culture    High Sensitivity Troponin-I at 0 hrs    High Sensitivity Troponin-I at 2 hrs with calculated Delta    Lipase    NT-proBNP    GFR    Cardiac Monitoring    NG Tube Insertion    ECG 12 lead       ASSESSMENT & PLAN     Marcia Yannick Steuber is a 88 y.o. male admitted under medicine with <principal problem not specified>.    Patient Active Hospital Problem List:     No active hospital problems.    Assessment and plan:  1.  Right inguinal hernia       Small bowel obstruction  -Presented with right lower quadrant abdominal pain that started earlier today  -Hemodynamically stable.  Abdomen is soft and nontender  -CT A/P showed small bowel obstruction with transition point at site of new right-sided inguinal hernia.   -Evaluated by Dr. Allena Katz, surgery and hernia reduced  -Keep n.p.o.  -Hold Eliquis for tonight  -NG tube in place.  We will do AXR  -Electronic consult for cardiology clearance placed.  Touch base with cardiology in the morning  -Monitor vitals  -Pain control  -Follow-up with surgery consult    2.  Recent acute pulmonary embolism with cor pulmonale   -Patient has been on Eliquis.  Holding  Eliquis for tonight pending surgery evaluation in the morning.  Resume as indicated    2.CAD status post CABG  -Denies chest pain or chest tightness    3.Cardiomyopathy    Combined systolic and diastolic congestive heart failure  -LVEF 25 to 30%  -Monitor I&O and daily weight  - Hold Lasix for now    4. Hypertension  -Resume Entresto in a.m. if vitals are stable    5.Dyslipidemia  -On Crestor    6.Dementia   -On Namenda    Nutrition  N.p.o.    DVT/VTE Prophylaxis  Resume Eliquis after surgery evaluation in the morning    Anticipated medical stability for  discharge: TBD    Service status/Reason for ongoing hospitalization: Right inguinal hernia  Anticipated Discharge Needs: TBD    Signed,  Alecia Lemming, MD    04/27/2021 9:26 PM  Time Elapsed: 35 minutes

## 2021-04-27 NOTE — ED Triage Notes (Signed)
Pt transferred from [facility] after c/o severe groin pain. Pt has hernia on the right side.    At [facility], pt was given 4 mg morphine and zofran. Staff tried to place NG tube without success. Pt has 20 g R FA.     MMT medic reported last BP 130 systolic, HR 70s. When pt coughs, HR drops into the 40s, per medic.

## 2021-04-27 NOTE — ED Notes (Signed)
[facility] EMERGENCY DEPARTMENT  ED NURSING NOTE FOR THE RECEIVING INPATIENT NURSE   ED NURSE Shellia Cleverly 1610   ED CHARGE RN [phone]   ADMISSION INFORMATION   Sarvesh Aceyn Kathol is a 88 y.o. male admitted with a diagnosis of:    1. Incarcerated inguinal hernia    2. Anticoagulated         Isolation: None   Allergies: Patient has no known allergies.   Holding Orders confirmed? Yes   Belongings Documented? Yes   Home medications sent to pharmacy confirmed? NA   NURSING CARE   Patient Comes From:   Mental Status: Home/Family Care  alert and oriented   ADL: Needs assistance with ADLs   Ambulation: 1 person assist   Pertinent Information  and Safety Concerns: Pt has NG tube placed 53, right nare.     COVID Test sent to lab? Yes   VITAL SIGNS   Time BP Temp Pulse Resp SpO2   220 148/67 98.6 49 14 97% room air   CT / NIH   CT Head ordered on this patient?  No   NIH/Dysphagia assessment done prior to admission? NA   PERSONAL PROTECTIVE EQUIPMENT   Face Shield and Gloves   LAB RESULTS   Labs Reviewed   CBC AND DIFFERENTIAL - Abnormal; Notable for the following components:       Result Value    Platelets 83 (*)     RBC 3.73 (*)     MCV 104.3 (*)     MCH 34.3 (*)     All other components within normal limits   COMPREHENSIVE METABOLIC PANEL - Abnormal; Notable for the following components:    Glucose 143 (*)     BUN 40.0 (*)     Globulin 4.0 (*)     All other components within normal limits   URINALYSIS REFLEX TO MICROSCOPIC EXAM - REFLEX TO CULTURE - Abnormal; Notable for the following components:    Protein, UR Trace (*)     All other components within normal limits   PROBNP - Abnormal; Notable for the following components:    NT-proBNP 1,530 (*)     All other components within normal limits   COVID-19 (SARS-COV-2)    Narrative:     o Collect and clearly label specimen type:  o PREFERRED-Upper respiratory specimen: One Nasal Swab in  [employer].  o Hand deliver to laboratory ASAP  Indication for  testing->Extended care facility admission to  semi private room  Screening   COVID-19 (SARS-COV-2)    Narrative:     o Collect and clearly label specimen type:  o PREFERRED-Upper respiratory specimen: One Nasal Swab in  [employer].  o Hand deliver to laboratory ASAP  Indication for testing->Extended care facility admission to  semi private room  Screening   HIGH SENSITIVITY TROPONIN-I   LIPASE   GFR   HIGH SENSITIVITY TROPONIN-I WITH DELTA          Ticket to Ride Printed: Yes

## 2021-04-27 NOTE — ED Provider Notes (Shared)
[place] ACCEPTANCE NOTE FROM THE [facility]     None        Date: 04/27/2021  Patient Name: Rick [name]    This note was generated by the Epic EMR system/ Dragon speech recognition and may contain inherent errors or omissions not intended by the user. Grammatical errors, random word insertions, deletions and pronoun errors  are occasional consequences of this technology due to software limitations. Not all errors are caught or corrected. If there are questions or concerns about the content of this note or information contained within the body of this dictation they should be addressed directly with the author for clarification.    [facility] TRANSFER NOTE  (paste note below)     ***    [place] ACCEPTANCE NOTE     I have assumed care of Rick [name] at 9:07 PM.  I have evaluated the patient and reviewed all pertinent results and findings, including the [facility] transfer note above.  The reason for the transfer is that the patient {graf reason for transfer:34974}.    CLINICAL COURSE     Vital Signs:BP 154/65   Pulse (!) 58   Temp 98.4 F (36.9 C)   Resp 14   SpO2 99%     ED Course:    2111: Spoke with Dr. Allena Katz, Gen Surgery, who reduced the hernia at bedside. Recommendations: ok to hold off on NGT now. Hold Eliquis, consult for IVC filter during admission, admit to medicine, inpatient cardiology evaluation for possible surgical intervention, keep NPO for now.     2119: Spoke with [facility] ED Dr. Lucianne Muss, who already reached out to [facility] and [facility] (Dr. Dan Humphreys). I updated Dr. Dan Humphreys on the above details via Epic Secure Chat.     2128: Dr. Mikal Plane accepted patient for admission, Inpatient Acute Care bed.     Diagnostic Study Results     Labs:     Results       Procedure Component Value Units Date/Time    NT-proBNP [[id number]]  (Abnormal) Collected: 04/27/21 1814     Updated: 04/27/21 1909     NT-proBNP 1,530 pg/mL     High Sensitivity Troponin-I at 0 hrs [[id number]] Collected:  04/27/21 1814    Specimen: Blood Updated: 04/27/21 1857     hs Troponin-I 17.6 ng/L     Urinalysis Reflex to Microscopic Exam- Reflex to Culture [[id number]]  (Abnormal) Collected: 04/27/21 1822    Specimen: Urine, Clean Catch Updated: 04/27/21 1855     Urine Type Urine, Clean Ca     Color, UA Yellow     Clarity, UA Clear     Specific Gravity UA 1.025     Urine pH 6.5     Leukocyte Esterase, UA Negative     Nitrite, UA Negative     Protein, UR Trace     Glucose, UA Negative     Ketones UA Negative     Urobilinogen, UA 0.2 mg/dL      Bilirubin, UA Negative     Blood, UA Trace-lysed     RBC, UA 3 - 5 /hpf      WBC, UA 0-5 /hpf      Squamous Epithelial Cells, Urine 0-5 /hpf     Lipase [[id number]] Collected: 04/27/21 1814    Specimen: Blood Updated: 04/27/21 1852     Lipase 22 U/L     GFR [[id number]] Collected: 04/27/21 1814     Updated: 04/27/21 1852     EGFR 57.0  Comprehensive metabolic panel [[id number]]  (Abnormal) Collected: 04/27/21 1814    Specimen: Blood Updated: 04/27/21 1852     Glucose 143 mg/dL      BUN 47.8 mg/dL      Creatinine 1.4 mg/dL      Sodium 295 mEq/L      Potassium 4.6 mEq/L      Chloride 105 mEq/L      CO2 25 mEq/L      Calcium 10.0 mg/dL      Protein, Total 7.9 g/dL      Albumin 3.9 g/dL      AST (SGOT) 26 U/L      ALT 16 U/L      Alkaline Phosphatase 69 U/L      Bilirubin, Total 0.7 mg/dL      Globulin 4.0 g/dL      Albumin/Globulin Ratio 1.0     Anion Gap 11.0    CBC and differential [[id number]]  (Abnormal) Collected: 04/27/21 1814    Specimen: Blood Updated: 04/27/21 1847     WBC 4.22 x10 3/uL      Hgb 12.8 g/dL      Hematocrit 84.6 %      Platelets 83 x10 3/uL      RBC 3.73 x10 6/uL      MCV 104.3 fL      MCH 34.3 pg      MCHC 32.9 g/dL      RDW 13 %      MPV 12.3 fL      Instrument Absolute Neutrophil Count 3.28 x10 3/uL      Neutrophils 77.6 %      Lymphocytes Automated 14.0 %      Monocytes 6.9 %      Eosinophils Automated 0.5 %      Basophils Automated 0.5 %      Immature Granulocytes  0.5 %      Nucleated RBC 0.0 /100 WBC      Neutrophils Absolute 3.28 x10 3/uL      Lymphocytes Absolute Automated 0.59 x10 3/uL      Monocytes Absolute Automated 0.29 x10 3/uL      Eosinophils Absolute Automated 0.02 x10 3/uL      Basophils Absolute Automated 0.02 x10 3/uL      Immature Granulocytes Absolute 0.02 x10 3/uL      Absolute NRBC 0.00 x10 3/uL             Radiologic Studies:   Radiology Results (24 Hour)       Procedure Component Value Units Date/Time    Chest AP Portable [[id number]] Collected: 04/27/21 2008    Order Status: Completed Updated: 04/27/21 2014    Narrative:      HISTORY: Status post feeding tube placement    EXAMINATION: Portable frontal view of chest    COMPARISON: 04/08/2021    FINDINGS: Median sternotomy wires indicate previous cardiothoracic  surgery. There is a feeding tube with distal tip suspected to be in a  right lower lobe bronchus. No pneumothorax appreciated radiographically  at this time. Cardiac silhouette is stable. Stable elevation of right  lung base with obscuration of the right lower chest and resulting  atelectasis. Stable stent overlying the aortic root.      Impression:       Feeding tube noted with distal tip suspected to be in a  right lower lobe bronchus.    Provided history that patient's ER clinician Dr. Lucianne Muss was already aware  and removed the patient's feeding  tube.    Ivin Poot, MD  04/27/2021 8:12 PM    CT Abd/Pelvis without Contrast [[id number]] Collected: 04/27/21 1921    Order Status: Completed Updated: 04/27/21 1934    Narrative:      HISTORY: Right lower abdominal pain and fatigue. Chronic renal disease.    EXAMINATION AND TECHNIQUE: Spiral low dose modulated unenhanced CT of  abdomen and pelvis presented for evaluation. 2.5 mm axial images were  obtained.    Note that CT scanning at this site utilizes multiple dose reduction  techniques including automatic exposure control, adjustment of the MAS  and/or KVP according to patient size, and use of  iterative  reconstruction technique.    COMPARISON: CT 06/16/2019    FINDINGS: Visualized lung bases exhibit atelectasis, right greater than  left. The unenhanced liver, spleen, adrenal glands, and pancreas are  intact. Gallbladder present. The kidneys exhibit no hydronephrosis. No  ascites in the upper abdomen. No bulky adenopathy. Abdominal aorta  normal in caliber. The abdominal aorta is heavily calcified but not  aneurysmal in caliber. Evaluation of the intestinal system reveals  gastric distention and dilated loops of small bowel with transition zone  at the site of a right-sided inguinal hernia. No CT evidence of free air  or abscess at this time. There is constipation of the colon. Evaluation  of the osseous structures reveal degenerative in the hips and spine.  There is an intramedullary rod in the proximal metaphysis of the right  femur. Redemonstration of sclerosis involving the femoral heads  bilaterally suggestive of possible early avascular necrosis. However, no  collapse is noted of the femoral heads.    The pelvis contains no free fluid or bulky adenopathy. The bladder is  intact. There are surgical clips at the prostate bed.      Impression:         1. Small bowel obstruction with transition point at site of new  right-sided inguinal hernia.  2. Remainder as above.    Ivin Poot, MD  04/27/2021 7:32 PM        .    MEDICAL DECISION MAKING     Provider Note: 88 y.o. male transferred to [facility] ED from [facility] free-standing ED for ***      Personal Protective Equipment (PPE)    {Personal Protective Equipment:1606019}        DIAGNOSIS     Clinical Impression:   1. Incarcerated inguinal hernia        Treatment Plan:   ED Disposition       ED Disposition   Transfer to [facility] ED    Condition   --    Date/Time   Sun Apr 27, 2021  7:52 PM    Comment   Accepting: Dr. Melvyn Neth                 Chart reconciliation: *** was the primary emergency physician of  record.    _______________________________    Attestations:  This transfer acceptance note is prepared by Ardis Hughs, MD, PhD, FACEP.     _______________________________

## 2021-04-28 DIAGNOSIS — F0150 Vascular dementia without behavioral disturbance: Secondary | ICD-10-CM | POA: Diagnosis present

## 2021-04-28 DIAGNOSIS — N170 Acute kidney failure with tubular necrosis: Secondary | ICD-10-CM | POA: Diagnosis present

## 2021-04-28 DIAGNOSIS — I350 Nonrheumatic aortic (valve) stenosis: Secondary | ICD-10-CM

## 2021-04-28 LAB — CBC AND DIFFERENTIAL
Absolute NRBC: 0 10*3/uL (ref 0.00–0.00)
Basophils Absolute Automated: 0 10*3/uL (ref 0.00–0.08)
Basophils Automated: 0 %
Eosinophils Absolute Automated: 0.01 10*3/uL (ref 0.00–0.44)
Eosinophils Automated: 0.2 %
Hematocrit: 36.8 % — ABNORMAL LOW (ref 37.6–49.6)
Hgb: 12 g/dL — ABNORMAL LOW (ref 12.5–17.1)
Immature Granulocytes Absolute: 0.01 10*3/uL (ref 0.00–0.07)
Immature Granulocytes: 0.2 %
Instrument Absolute Neutrophil Count: 4.28 10*3/uL (ref 1.10–6.33)
Lymphocytes Absolute Automated: 0.7 10*3/uL (ref 0.42–3.22)
Lymphocytes Automated: 12.9 %
MCH: 34.3 pg — ABNORMAL HIGH (ref 25.1–33.5)
MCHC: 32.6 g/dL (ref 31.5–35.8)
MCV: 105.1 fL — ABNORMAL HIGH (ref 78.0–96.0)
MPV: 12.4 fL (ref 8.9–12.5)
Monocytes Absolute Automated: 0.42 10*3/uL (ref 0.21–0.85)
Monocytes: 7.7 %
Neutrophils Absolute: 4.28 10*3/uL (ref 1.10–6.33)
Neutrophils: 79 %
Nucleated RBC: 0 /100 WBC (ref 0.0–0.0)
Platelets: 85 10*3/uL — ABNORMAL LOW (ref 142–346)
RBC: 3.5 10*6/uL — ABNORMAL LOW (ref 4.20–5.90)
RDW: 13 % (ref 11–15)
WBC: 5.42 10*3/uL (ref 3.10–9.50)

## 2021-04-28 LAB — GFR: EGFR: 57

## 2021-04-28 LAB — BASIC METABOLIC PANEL
Anion Gap: 8 (ref 5.0–15.0)
BUN: 37 mg/dL — ABNORMAL HIGH (ref 9.0–28.0)
CO2: 29 mEq/L (ref 17–29)
Calcium: 9.9 mg/dL (ref 7.9–10.2)
Chloride: 108 mEq/L (ref 99–111)
Creatinine: 1.4 mg/dL (ref 0.5–1.5)
Glucose: 108 mg/dL — ABNORMAL HIGH (ref 70–100)
Potassium: 4.6 mEq/L (ref 3.5–5.3)
Sodium: 145 mEq/L (ref 135–145)

## 2021-04-28 MED ORDER — DEXTROSE-SODIUM CHLORIDE 5-0.9 % IV SOLN
INTRAVENOUS | Status: AC
Start: 2021-04-28 — End: 2021-04-29

## 2021-04-28 MED ORDER — ENOXAPARIN SODIUM 40 MG/0.4ML IJ SOSY
40.0000 mg | PREFILLED_SYRINGE | INTRAMUSCULAR | Status: DC
Start: 2021-04-28 — End: 2021-04-30
  Administered 2021-04-28: 13:00:00 40 mg via SUBCUTANEOUS
  Filled 2021-04-28: qty 0.4

## 2021-04-28 MED ORDER — SACUBITRIL-VALSARTAN 24-26 MG PO TABS
0.5000 | ORAL_TABLET | Freq: Two times a day (BID) | ORAL | Status: DC
Start: 2021-04-28 — End: 2021-05-02
  Administered 2021-04-28 – 2021-05-02 (×9): 0.5 via ORAL
  Filled 2021-04-28 (×11): qty 1

## 2021-04-28 NOTE — Consults (Signed)
CONSULTATION REPORT  [facility]      Consultation Date Time: 04/28/21 12:03 AM  Date of Admission: 04/27/2021     Requesting Physician: Santa Genera, MD  Consulting Physician: Velora Heckler, MD  Consulting Service: General Surgery    Reason For Consultation:  Abdominal Pain    History of Present Illness:   Rick [name] is a 88 y.o. male who  has a past medical history of Atrial enlargement, bilateral (Echo result 04/04/19), CAD (coronary artery disease) (05/28/19 Cath results.), Chronic combined systolic and diastolic heart failure (310/21 OV Dr. Penny Pia), Dementia (04/2014), Difficulty walking (06/2016), Dyslipidemia (310/21 OV Dr. Penny Pia), Dyspnea on exertion (02/2018), HTN (hypertension) (310/21 OV Dr. Penny Pia), Mild cognitive impairment (310/21 OV Dr. Penny Pia), Primary cardiomyopathy (11/2017), RBBB (07/10/19 EKg result), and S/P CABG (coronary artery bypass graft).Marland KitchenHe presented to the ED today with complaints of a right groin bulge and pain.  The pain was noted to have started today.  At the time of my examination there was no family present.  He states he has not had prior surgery.  He did note prior pain and discomfort at the site and pain today.    Past Medical History:     Past Medical History:   Diagnosis Date    Atrial enlargement, bilateral Echo result 04/04/19    CAD (coronary artery disease) 05/28/19 Cath results.    Severe 3V CAD.  Patent LIMA to LAD, patent SVG to OM 1 and OM 2 of circumflex CA, & patent SVG to RCA.    Chronic combined systolic and diastolic heart failure 310/21 OV Dr. Penny Pia    EF=30%    Dementia 04/2014    Difficulty walking 06/2016    Post surgery of lt knee, rt femur    Dyslipidemia 310/21 OV Dr. Penny Pia    Dyspnea on exertion 02/2018    HTN (hypertension) 310/21 OV Dr. Penny Pia    Mild cognitive impairment 310/21 OV Dr. Penny Pia    Primary cardiomyopathy 11/2017    RBBB 07/10/19 EKg result    S/P CABG (coronary artery bypass graft)      LIMA to LAD,  SVG  to OM 1 & OM 2 of circumflex CA, & SVG to RCA.       Past Surgical History:     Past Surgical History:   Procedure Laterality Date    CORONARY ARTERY BYPASS GRAFT  2003     approx year: LIMA to LAD,  SVG to OM 1 & OM 2 of circumflex CA, & SVG to RCA    FRACTURE SURGERY Right 06/2016    femur    PERCUTANEOUS VALVULOPLASTY - AORTIC N/A 07/12/2019    Procedure: TAVR #2;  Surgeon: Arlyss Queen, MD PhD;  Location: [facility];  Service: Cardiovascular;  Laterality: N/A;  23 HR OB    REPLACEMENT, AORTIC VALVE , EDWARDS SAPIEN N/A 07/12/2019    Procedure: REPLACEMENT, AORTIC VALVE , EDWARDS SAPIEN -- TAVR Valve:   S3 29mm -- Access: RTF -- IC: Dr. Penny Pia;  Surgeon: Christene Lye, MD;  Location: [facility];  Service: Cardiothoracic;  Laterality: N/A;    RIGHT & LEFT HEART CATH POSSIBLE PCI N/A 05/26/2019    Procedure: RIGHT & LEFT HEART CATH POSSIBLE PCI;  Surgeon: Arlyss Queen, MD PhD;  Location: [facility];  Service: Cardiovascular;  Laterality: N/A;  [id number] scheduled with Maira in dr's office. OP. kr    TAVR PERCUTANEOUS FEMORAL  07/13/2019  TOTAL KNEE ARTHROPLASTY Left 2017    TOTAL KNEE ARTHROPLASTY Right 2005       Family History:     Family History   Problem Relation Age of Onset    No known problems Mother     No known problems Father     No known problems Sister     No known problems Brother        Social History:     Social History     Socioeconomic History    Marital status: Widowed     Spouse name: Not on file    Number of children: Not on file    Years of education: Not on file    Highest education level: Not on file   Occupational History    Not on file   Tobacco Use    Smoking status: Never    Smokeless tobacco: Never   Vaping Use    Vaping Use: Never used   Substance and Sexual Activity    Alcohol use: Never    Drug use: Never    Sexual activity: Not Currently     Partners: Female     Birth control/protection: None   Other Topics Concern    Not on file   Social History  Narrative    Not on file     Social Determinants of Health     Financial Resource Strain: Low Risk     Difficulty of Paying Living Expenses: Not hard at all   Food Insecurity: No Food Insecurity    Worried About Programme researcher, broadcasting/film/video in the Last Year: Never true    Ran Out of Food in the Last Year: Never true   Transportation Needs: No Transportation Needs    Lack of Transportation (Medical): No    Lack of Transportation (Non-Medical): No   Physical Activity: Insufficiently Active    Days of Exercise per Week: 6 days    Minutes of Exercise per Session: 10 min   Stress: No Stress Concern Present    Feeling of Stress : Not at all   Social Connections: Unknown    Frequency of Communication with Friends and Family: Once a week    Frequency of Social Gatherings with Friends and Family: Patient refused    Attends Religious Services: Patient refused    Database administrator or Organizations: No    Attends Banker Meetings: Never    Marital Status: Widowed   Catering manager Violence: Not At Risk    Fear of Current or Ex-Partner: No    Emotionally Abused: No    Physically Abused: No    Sexually Abused: No   Housing Stability: Low Risk     Unable to Pay for Housing in the Last Year: No    Number of Places Lived in the Last Year: 1    Unstable Housing in the Last Year: No       Allergies:   No Known Allergies    Medications:     Prior to Admission medications    Medication Sig Start Date End Date Taking? Authorizing Provider   apixaban (ELIQUIS) 5 MG Take 1 tablet (5 mg) by mouth every 12 (twelve) hours 04/16/21  Yes Elouardighi, Brahim, MD   furosemide (LASIX) 40 MG tablet Take 1 tablet (40 mg) by mouth daily 04/12/21  Yes Elouardighi, Brahim, MD   memantine (NAMENDA) 10 MG tablet Take 2 tablets (20 mg total) by mouth every evening 10/17/19  Yes Erasmo Score,  MD   rosuvastatin (CRESTOR) 20 MG tablet  11/28/19  Yes [provider]   sacubitril-valsartan (ENTRESTO) 24-26 MG Tab per tablet Take 1 tablet by mouth  2 (two) times daily 1/2 04/11/21  Yes Elouardighi, Miachel Roux, MD   acetaminophen (TYLENOL) 325 MG tablet Take 2 tablets (650 mg total) by mouth every 4 (four) hours as needed for Pain 07/13/19   Bowman, Cristin A, NP   docusate sodium (COLACE) 100 MG capsule Take 1 capsule (100 mg) by mouth 2 (two) times daily    [provider]   Multiple Vitamin (multivitamin) capsule Take 1 capsule by mouth daily    [provider]   vitamin D (CHOLECALCIFEROL) 25 MCG (1000 UT) tablet Take 2 tablets (2,000 Units) by mouth Once every Monday, Wednesday and Friday morning    [provider]       Review of Systems:   As per the HPI.  The patient denies any additional changes to their otic, opthalmologic, dermatologic, pulmonary, cardiac, gastrointestinal, genitourinary, musculoskeletal, hematologic, constitutional, or psychiatric systems.      Physical Exam:     Vitals:    04/27/21 2247   BP: 131/57   Pulse: 62   Resp: 16   Temp: 97.9 F (36.6 C)   SpO2: 98%       General appearance - elderly male, no distress  Eyes - sclera anicteric, left eye normal, right eye normal  Ears - right ear normal, left ear normal  Nose - normal and patent  Chest - no tachypnea, retractions or cyanosis  Heart - normal rate and regular rhythm  Abdomen - soft, nontender, nondistended, no masses or organomegaly  Right groin - incarcerated inguinal hernia.  Reduced with constant gentle pressure  Skin - normal color, no rash    Labs:     Results       Procedure Component Value Units Date/Time    COVID-19 (SARS-CoV-2) only ([facility]) asymptomatic admission [[id number]] Collected: 04/27/21 2146    Specimen: Nasopharyngeal Updated: 04/27/21 2213     Purpose of COVID testing Screening     SARS-CoV-2 Specimen Source Nasal Swab     SARS CoV 2 Overall Result Not Detected    Narrative:      o Collect and clearly label specimen type:  o PREFERRED-Upper respiratory specimen: One Nasal Swab in  Transport Media.  o Hand deliver to laboratory  ASAP  Indication for testing->Extended care facility admission to  semi private room  Screening    COVID-19 (SARS-CoV-2) only ([facility]) asymptomatic admission [[id number]] Collected: 04/27/21 2146    Specimen: Nasopharyngeal Updated: 04/27/21 2146    Narrative:      o Collect and clearly label specimen type:  o PREFERRED-Upper respiratory specimen: One Nasal Swab in  Transport Media.  o Hand deliver to laboratory ASAP  Indication for testing->Extended care facility admission to  semi private room  Screening    NT-proBNP [[id number]]  (Abnormal) Collected: 04/27/21 1814     Updated: 04/27/21 1909     NT-proBNP 1,530 pg/mL     High Sensitivity Troponin-I at 0 hrs [[id number]] Collected: 04/27/21 1814    Specimen: Blood Updated: 04/27/21 1857     hs Troponin-I 17.6 ng/L     Urinalysis Reflex to Microscopic Exam- Reflex to Culture [[id number]]  (Abnormal) Collected: 04/27/21 1822    Specimen: Urine, Clean Catch Updated: 04/27/21 1855     Urine Type Urine, Clean Ca     Color, UA Yellow  Clarity, UA Clear     Specific Gravity UA 1.025     Urine pH 6.5     Leukocyte Esterase, UA Negative     Nitrite, UA Negative     Protein, UR Trace     Glucose, UA Negative     Ketones UA Negative     Urobilinogen, UA 0.2 mg/dL      Bilirubin, UA Negative     Blood, UA Trace-lysed     RBC, UA 3 - 5 /hpf      WBC, UA 0-5 /hpf      Squamous Epithelial Cells, Urine 0-5 /hpf     Lipase [[id number]] Collected: 04/27/21 1814    Specimen: Blood Updated: 04/27/21 1852     Lipase 22 U/L     GFR [[id number]] Collected: 04/27/21 1814     Updated: 04/27/21 1852     EGFR 57.0    Comprehensive metabolic panel [[id number]]  (Abnormal) Collected: 04/27/21 1814    Specimen: Blood Updated: 04/27/21 1852     Glucose 143 mg/dL      BUN 08.6 mg/dL      Creatinine 1.4 mg/dL      Sodium 578 mEq/L      Potassium 4.6 mEq/L      Chloride 105 mEq/L      CO2 25 mEq/L      Calcium 10.0 mg/dL      Protein, Total 7.9 g/dL      Albumin 3.9 g/dL      AST (SGOT) 26 U/L       ALT 16 U/L      Alkaline Phosphatase 69 U/L      Bilirubin, Total 0.7 mg/dL      Globulin 4.0 g/dL      Albumin/Globulin Ratio 1.0     Anion Gap 11.0    CBC and differential [[id number]]  (Abnormal) Collected: 04/27/21 1814    Specimen: Blood Updated: 04/27/21 1847     WBC 4.22 x10 3/uL      Hgb 12.8 g/dL      Hematocrit 41.3 %      Platelets 83 x10 3/uL      RBC 3.73 x10 6/uL      MCV 104.3 fL      MCH 34.3 pg      MCHC 32.9 g/dL      RDW 13 %      MPV 12.3 fL      Instrument Absolute Neutrophil Count 3.28 x10 3/uL      Neutrophils 77.6 %      Lymphocytes Automated 14.0 %      Monocytes 6.9 %      Eosinophils Automated 0.5 %      Basophils Automated 0.5 %      Immature Granulocytes 0.5 %      Nucleated RBC 0.0 /100 WBC      Neutrophils Absolute 3.28 x10 3/uL      Lymphocytes Absolute Automated 0.59 x10 3/uL      Monocytes Absolute Automated 0.29 x10 3/uL      Eosinophils Absolute Automated 0.02 x10 3/uL      Basophils Absolute Automated 0.02 x10 3/uL      Immature Granulocytes Absolute 0.02 x10 3/uL      Absolute NRBC 0.00 x10 3/uL             Rads:     Radiology Results (24 Hour)       Procedure Component Value Units Date/Time    XR Abdomen  Portable [[id number]] Resulted: 04/27/21 2340    Order Status: Sent Updated: 04/27/21 2340    Chest AP Portable [[id number]] Collected: 04/27/21 2008    Order Status: Completed Updated: 04/27/21 2014    Narrative:      HISTORY: Status post feeding tube placement    EXAMINATION: Portable frontal view of chest    COMPARISON: 04/08/2021    FINDINGS: Median sternotomy wires indicate previous cardiothoracic  surgery. There is a feeding tube with distal tip suspected to be in a  right lower lobe bronchus. No pneumothorax appreciated radiographically  at this time. Cardiac silhouette is stable. Stable elevation of right  lung base with obscuration of the right lower chest and resulting  atelectasis. Stable stent overlying the aortic root.      Impression:       Feeding tube noted with  distal tip suspected to be in a  right lower lobe bronchus.    Provided history that patient's ER clinician Dr. Lucianne Muss was already aware  and removed the patient's feeding tube.    Ivin Poot, MD  04/27/2021 8:12 PM    CT Abd/Pelvis without Contrast [[id number]] Collected: 04/27/21 1921    Order Status: Completed Updated: 04/27/21 1934    Narrative:      HISTORY: Right lower abdominal pain and fatigue. Chronic renal disease.    EXAMINATION AND TECHNIQUE: Spiral low dose modulated unenhanced CT of  abdomen and pelvis presented for evaluation. 2.5 mm axial images were  obtained.    Note that CT scanning at this site utilizes multiple dose reduction  techniques including automatic exposure control, adjustment of the MAS  and/or KVP according to patient size, and use of iterative  reconstruction technique.    COMPARISON: CT 06/16/2019    FINDINGS: Visualized lung bases exhibit atelectasis, right greater than  left. The unenhanced liver, spleen, adrenal glands, and pancreas are  intact. Gallbladder present. The kidneys exhibit no hydronephrosis. No  ascites in the upper abdomen. No bulky adenopathy. Abdominal aorta  normal in caliber. The abdominal aorta is heavily calcified but not  aneurysmal in caliber. Evaluation of the intestinal system reveals  gastric distention and dilated loops of small bowel with transition zone  at the site of a right-sided inguinal hernia. No CT evidence of free air  or abscess at this time. There is constipation of the colon. Evaluation  of the osseous structures reveal degenerative in the hips and spine.  There is an intramedullary rod in the proximal metaphysis of the right  femur. Redemonstration of sclerosis involving the femoral heads  bilaterally suggestive of possible early avascular necrosis. However, no  collapse is noted of the femoral heads.    The pelvis contains no free fluid or bulky adenopathy. The bladder is  intact. There are surgical clips at the prostate bed.       Impression:         1. Small bowel obstruction with transition point at site of new  right-sided inguinal hernia.  2. Remainder as above.    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  Dry cough    Aortic valve stenosis, etiology of cardiac valve disease unspecified    S/P TAVR (transcatheter aortic valve replacement)    Difficulty in walking, not elsewhere classified    Dementia without behavioral disturbance, unspecified dementia type    Balance disorder    Chronic HFrEF (heart failure with reduced ejection fraction)    Acute pulmonary embolism with acute cor pulmonale, unspecified pulmonary embolism type    Pulmonary embolism with acute cor pulmonale, unspecified chronicity, unspecified pulmonary embolism type    Incarcerated inguinal hernia       Plan:   I was able to reduce his incarcerated right inguinal hernia at the bedside.  The hernia did not immediately and his symptoms resolved immediately .  This allows [place] time to optimize his and allow the effects of Eliquis to come off.  In the mean time he should have a medical and cardiac risk stratification.  Based on how recent his PE can also potentially consider a filter if indicated prior to surgery.        Signed by: Velora Heckler, MD

## 2021-04-28 NOTE — Plan of Care (Signed)
Problem: Moderate/High Fall Risk Score >5  Goal: Patient will remain free of falls  Outcome: Progressing  Flowsheets (Taken 04/28/2021 2220)  High (Greater than 13):   HIGH-Visual cue at entrance to patient's room   HIGH-Bed alarm on at all times while patient in bed   HIGH-Utilize chair pad alarm for patient while in the chair   HIGH-Apply yellow "Fall Risk" arm band   HIGH-Initiate use of floor mats as appropriate   HIGH-Consider use of low bed     Problem: Pain interferes with ability to perform ADL  Goal: Pain at adequate level as identified by patient  Outcome: Progressing  Flowsheets (Taken 04/28/2021 2335)  Pain at adequate level as identified by patient:   Identify patient comfort function goal   Assess for risk of opioid induced respiratory depression, including snoring/sleep apnea. Alert healthcare team of risk factors identified.   Assess pain on admission, during daily assessment and/or before any "as needed" intervention(s)   Reassess pain within 30-60 minutes of any procedure/intervention, per Pain Assessment, Intervention, Reassessment (AIR) Cycle   Evaluate if patient comfort function goal is met   Evaluate patient's satisfaction with pain management progress   Include patient/patient care companion in decisions related to pain management as needed     Problem: Safety  Goal: Patient will be free from injury during hospitalization  Outcome: Progressing  Flowsheets (Taken 04/28/2021 2335)  Patient will be free from injury during hospitalization:   Assess patient's risk for falls and implement fall prevention plan of care per policy   Provide and maintain safe environment   Ensure appropriate safety devices are available at the bedside   Include patient/ family/ care giver in decisions related to safety   Hourly rounding   Assess for patients risk for elopement and implement Elopement Risk Plan per policy  Goal: Patient will be free from infection during hospitalization  Outcome: Progressing  Flowsheets  (Taken 04/28/2021 2335)  Free from Infection during hospitalization:   Assess and monitor for signs and symptoms of infection   Monitor lab/diagnostic results   Monitor all insertion sites (i.e. indwelling lines, tubes, urinary catheters, and drains)   Encourage patient and family to use good hand hygiene technique

## 2021-04-28 NOTE — Consults (Signed)
[facility]   Date:  04/28/2021  8:41 AM    Primary Cardiologist: Glean Hess, MD  Case Discussed with [facility] Attending: Betti Cruz, MD    Provider Completing Note:  Jinny Blossom, PA-C  [facility])  [facility] [phone] (7am-7pm)  OFFICE Number [phone] (7pm-7am)  MD LINE [phone]    Patient:  Rick [name] [facility].  DOB: [birth year].  male  Date of Admission:  04/27/2021    Attending:  Vella Raring, *       Reason For Consultation:   Preoperative cardiovascular assessment    ASSESSMENT/PLAN:   88 y.o. male with a history of CAD s/p CABG 2003 while living in [place] ([facility] 06/2019 with patent LIMA to LAD, patent SVG to OM1, patent SVG to OM2 , patent SVG to RCA), chronic HFrEF (class III symptoms historically), cardiomyopathy LVEF of 30% (mixed), and AS s/p TAVR 2021, hypertension, HLD who was recently admitted with acute PE and discharged home on Eliquis, now presented to the hospital on 04/27/2021 with right inguinal hernia with severe pain.    #Right inguinal hernia with small bowel obstruction-has been seen by surgery.  Plan is for surgical intervention this week.     #chronic HFrEF-at baseline his activity is fairly limited by balance, uses a cane. Does not typically complain of shortness of breath with his normal light activity.  Not acutely decompensated.  Has been doing well on Lasix 40 mg daily at home since last admission.  -Volume management: Lasix currently on hold given patient's limited dietary status.  Will monitor volume without diuretic.  In the past he had not been on a daily diuretic, that was started in February.  -GDMT: Not chronically on a beta-blocker due to history of bradycardia.  We will restart usual low-dose Entresto.  -Strict I's and O's, standing daily weights, fluid and sodium restriction     #Mixed ischemic/nonischemic cardiomyopathy-history of ejection fraction 30% in 2021, felt to be mixed etiology.   Echocardiogram this admission ejection fraction 20-30%.  -GDMT as above     #CAD-history of CABG in [place] in 2003.  Prior to TAVR in 2021 left heart catheterization showed patent bypass grafts as detailed.  He is having no anginal symptoms.  High sensitive troponin is normal.  No ischemic changes on EKG.   -Continue Crestor  -Not on aspirin.  Typically on full dose anticoagulation with Eliquis for his recent PE.     #Aortic stenosis, status post TAVR-echocardiogram February 2023 admission with normally functioning TAVR.    #Pulmonary hypertension/TR-echocardiogram last admission with severe TR, moderate pulm hypertension.  That was in the setting of acute PE.     #Ventricular ectopy-history of frequent PVCs.  Also NSVT.  Last admission low-dose beta-blocker trial however patient had significant bradycardia therefore that was discontinued.  -We will start telemetry  -Monitor electrolytes and ectopy.    #Pulmonary embolism-recent diagnosis of pulm embolism in February 2023.  Eliquis on hold.  Last dose was 04/27/2021 morning.  Any potential bridging defer to general surgery and primary tea.    #Preoperative risk assessment-functional status is limited by balance and mobility, uses a cane and walker.  Has a known cardiomyopathy with stable HFrEF.  Not acutely decompensated.  History of CAD with patent bypass grafts in 2021.  No anginal symptoms.  Has a history of PVCs and NSVT without symptoms.  We will start telemetry to monitor.  As above has not tolerated beta-blocker in the past.  -Patient  at least moderate risk for his planned procedure given his age and comorbidities but is stable from a cardiovascular standpoint without need for further cardiac testing, no cardiac contraindications to his necessary surgery.       Attending attestation    I have personally performed a face-to-face diagnostic evaluation and and discussed the plan with the patient and APP. I performed the substantive portion of this visit  by personally conducting the Medical Decision-Making component, in its entirety. Patient was also seen by APP , for the history and/or examination portion of the visit. I have also personally reviewed the patient's data, labs , EKG's , [facility] , Stress test and other diagnostic data as applicable.  In addition  I reviewed and updated the documented findings and plan of care accordingly as noted      Exam:   CVS: + JVD . HJR+      S1and S2 heard . No MRG  Lungs: CTA b/l ,   Right basilar rales  Oriented x1    Patient with poor exercise tolerance at baseline.  Currently mildly volume overloaded but not decompensated     We will start his home dose of Entresto and Lasix    GDMT up titration has been limited by hypotension and bradycardia  Patient has had issues with bradycardia when beta-blockers  was  reattempted last admission  Unable to tolerate more than half the dose of Entresto twice daily  Not on Jardiance given prior hypertension with full dose of Entresto  Mineralocorticoid receptor antagonist (MRA) therapy was not prescribed due to hypotension.     Patient will be overall at least intermediate risk for the proposed procedure and no cardiac contraindications  Will need his fluid status monitored closely perioperatively      Bari Edward, MD     HISTORY OF PRESENT ILLNESS:   Rick [name] is a 88 y.o. male with a history of CAD s/p CABG 2003 while living in [place] ([facility] 06/2019 with patent LIMA to LAD, patent SVG to OM1, patent SVG to OM2 , patent SVG to RCA), chronic HFrEF (class III symptoms historically), cardiomyopathy LVEF of 30% (mixed), and AS s/p TAVR 2021, hypertension, HLD who was recently admitted with acute PE and discharged home on Eliquis, now presented to the hospital on 04/27/2021 with right inguinal hernia with severe pain.    Of note, patient was admitted to [facility] with acute PE February 2023.  At that time our cardiology service saw the patient for an element of  congestive heart failure.  He was diuresed with IV Lasix and then transition to an oral dose of Lasix.  He was continued on low-dose Entresto, history of symptomatic hypotension.  He did have frequent PVCs and brief runs of NSVT during that admission.  Low-dose beta-blocker trial to suppress PVCs however had concerning bradycardia therefore beta-blocker was not continued.    Regarding current admission, patient presents with lower abdominal pain beginning yesterday.  Presented to the ED for further evaluation.    Regarding baseline cardiac symptoms, his mobility is fairly limited, uses a walker or a cane, does not usually have problems with shortness of breath with that low-level activity.  No orthopnea or PND.  No chest pain.  No palpitations, near-syncope, syncope.  No leg edema.  Since his last admission he has been on Lasix 40 mg daily without problems.    Hospital course:    Admission blood pressure 222/90 in the setting of pain.  Normal oxygen  saturation.      CT of the abdomen and pelvis showed small bowel obstruction with new right-sided inguinal hernia.  General surgery saw the patient in the ED and the reportedly reduced the hernia.    Anticoagulation, Eliquis, held.  NG tube was placed.     Plan plan is for surgical intervention of hernia.    NG tube removed last night, patient on clear liquid diet awaiting surgery this week.    Regarding vital signs, presenting blood pressure as noted was 222/90.  Blood pressure today systolic in the 120s to 150s, diastolic 50s to 60s.    Labs reviewed.  CBC unremarkable other than platelet count 85 K, consistent with previous values.  Chemistry with BUN 37, creatinine 1.4.  This creatinine is consistent with creatinine in February.  Electrolytes are normal.  LFTs are normal.  High-sensitivity troponin normal.  proBNP 1530, decreased from 5000 range in February.    Patient currently resting comfortably in bed, breathing on room air.  Denies shortness of breath or chest  pain.    He is alert, oriented to person and place.    -------------------  Outpatient cardiovascular medications: Crestor 20 mg daily, Entresto 24-26 mg 1/2 tablet twice daily, Lasix 40 mg daily, Eliquis 5 mg twice daily recently started for PE    Reported no current tobacco/alcohol/recreational drug use.    Reported no premature CAD in the family.    REVIEW OF SYSTEMS:                                              ARROS, ROSSSSS     All other systems were reviewed and were negative except as noted in HPI         CARDIAC DIAGNOSTICS:                                         [id number]     Telemetry:  (I independently reviewed the telemetry data) not currently on telemetry    ECG 04/27/2021:  (I independently reviewed the ECG tracing) sinus rhythm, 95 bpm, right bundle branch block, frequent isolated PVCs.  Right bundle branch block and PVCs are not new.    Chest Xray yesterday: No acute cardiopulmonary process.    TTE 04/09/21:  Summary    * The left ventricular cavity size is dilated.  Left ventricular wall  thickness is normal.  Left ventricular systolic function is severely decreased  with an estimated ejection fraction of 25-30%.    * The right ventricular cavity size is upper limits of normal in size.  Mildly decreased right ventricular systolic function.    * Severe bi-atrial dilation.    * There is a 29mm Sapien 3 TAVR valve in aortic position that is normally  functioning.    * There is severe tricuspid regurgitation.  Moderate pulmonary hypertension  with estimated right ventricular systolic pressure of 54 mmHg.    * There is moderate pulmonic valve regurgitation.    * There is mild to moderate mitral regurgitation.    * Elevated right atrial pressure.    * Compared to the prior study dated 08/25/2019 (personally reviewed),  tricuspid regurgitation increased. There are frequent PVC's throughout the  length of this study. Otherwise, no significant changes.  TTE(July 2021):  LVEF 30%, mild LVH, LVOT VTI is low  consistent with low stroke volume (58 mL) and cardiac output (3.3L/min).  mild dilated RV, decrease RV function, mild MR, mild-moderate TR, moderate pHTN (46 mmHg), dilated ascending aorta.       LHC 03/2019 (pre TAVR):  Severe three-vessel coronary artery disease.     Patent LIMA to the LAD, patent vein graft to obtuse marginal 1 and obtuse marginal 2 of the circumflex coronary artery, and patent vein graft to the right coronary artery.        PAST MEDICAL HISTORY:     Past Medical History:   Diagnosis Date    Atrial enlargement, bilateral Echo result 04/04/19    CAD (coronary artery disease) 05/28/19 Cath results.    Severe 3V CAD.  Patent LIMA to LAD, patent SVG to OM 1 and OM 2 of circumflex CA, & patent SVG to RCA.    Chronic combined systolic and diastolic heart failure 310/21 OV Dr. Penny Pia    EF=30%    Dementia 04/2014    Difficulty walking 06/2016    Post surgery of lt knee, rt femur    Dyslipidemia 310/21 OV Dr. Penny Pia    Dyspnea on exertion 02/2018    HTN (hypertension) 310/21 OV Dr. Penny Pia    Mild cognitive impairment 310/21 OV Dr. Penny Pia    Primary cardiomyopathy 11/2017    RBBB 07/10/19 EKg result    S/P CABG (coronary artery bypass graft)      LIMA to LAD,  SVG to OM 1 & OM 2 of circumflex CA, & SVG to RCA.     Past Surgical History:   Procedure Laterality Date    CORONARY ARTERY BYPASS GRAFT  2003     approx year: LIMA to LAD,  SVG to OM 1 & OM 2 of circumflex CA, & SVG to RCA    FRACTURE SURGERY Right 06/2016    femur    PERCUTANEOUS VALVULOPLASTY - AORTIC N/A 07/12/2019    Procedure: TAVR #2;  Surgeon: Arlyss Queen, MD PhD;  Location: [facility];  Service: Cardiovascular;  Laterality: N/A;  23 HR OB    REPLACEMENT, AORTIC VALVE , EDWARDS SAPIEN N/A 07/12/2019    Procedure: REPLACEMENT, AORTIC VALVE , EDWARDS SAPIEN -- TAVR Valve:   S3 29mm -- Access: RTF -- IC: Dr. Penny Pia;  Surgeon: Christene Lye, MD;  Location: [facility] OR;  Service: Cardiothoracic;  Laterality: N/A;    RIGHT &  LEFT HEART CATH POSSIBLE PCI N/A 05/26/2019    Procedure: RIGHT & LEFT HEART CATH POSSIBLE PCI;  Surgeon: Arlyss Queen, MD PhD;  Location: [facility];  Service: Cardiovascular;  Laterality: N/A;  [id number] scheduled with Maira in dr's office. OP. kr    TAVR PERCUTANEOUS FEMORAL  07/13/2019         TOTAL KNEE ARTHROPLASTY Left 2017    TOTAL KNEE ARTHROPLASTY Right 2005       Allergies:   No Known Allergies    MEDICATIONS:    INFUSION MEDS:      SCHEDULED MEDS:     Current Facility-Administered Medications   Medication Dose Route Frequency       SOCIAL HISTORY:     Social History     Tobacco Use    Smoking status: Never    Smokeless tobacco: Never   Substance Use Topics    Alcohol use: Never       FAMILY HISTORY:   family history includes No  known problems in his brother, father, mother, and sister.      PHYSICAL EXAM:                                                        ARPE3, ARPE2, PEEEEE   BP 152/62   Pulse 72   Temp 98.2 F (36.8 C) (Oral)   Resp 14   Ht 1.727 m (5\' 8" )   Wt 66.5 kg (146 lb 9.7 oz)   SpO2 99%   BMI 22.29 kg/m    Intake/Output Summary (Last 24 hours) at 04/28/2021 0841  Last data filed at 04/28/2021 [id number]  Gross per 24 hour   Intake 10 ml   Output 150 ml   Net -140 ml       General Appearance:  Breathing comfortable, no acute distress  Head:  normocephalic  Eyes:  EOM's intact, nonicteric sclera  Neck:  No carotid bruit or jugular venous distension.  No HJR.   Lungs:  Clear to auscultation throughout, no wheezes, rhonchi or rales, good respiratory effort, symmetric chest expansion  Cardiac:  RRR with some intermittent irregularity/ectopy heard, 2/6 systolic murmur heard  Abdomen:  Soft, non-tender, no rebound or guarding, non-distended, positive bowel sounds  Extremities:  No cyanosis or clubbing. No arthritis or deformities. No LE edema  Vascular: 2+ radial and distal pulses bilaterally  Neurologic:  Alert and oriented x3, mood and affect normal    LABORATORY:     CBC w/Diff    Recent Labs   Lab 04/28/21  0406 04/27/21  1814   WBC 5.42 4.22   Hgb 12.0* 12.8   Hematocrit 36.8* 38.9   Platelets 85* 83*        Basic Metabolic Profile   Recent Labs   Lab 04/28/21  0406 04/27/21  1814   Sodium 145 141   Potassium 4.6 4.6   Chloride 108 105   CO2 29 25   BUN 37.0* 40.0*   Creatinine 1.4 1.4   EGFR 57.0 57.0   Glucose 108* 143*   Calcium 9.9 10.0        Cardiac Enzymes   Recent Labs   Lab 04/27/21  1814   hs Troponin-I 17.6     Recent Labs   Lab 04/27/21  1814   NT-proBNP 1,530*        D-dimer          Thyroid Studies         Invalid input(s): FREET4     Cholesterol Panel          Coagulation Studies

## 2021-04-28 NOTE — Progress Notes (Signed)
I was asked by Surgical Resident to assess Rick [name] prior to his surgery tomorrow for open inguinal hernia repair. Pt has an extensive cardiac history and was admitted 04/09/21 for bilateral PE. Pt has been on Eliquis which is now being held since 04/27/21. Pt's labs are fairly nl with exception of plt count of 85.   Pt is at risk for increased fluid overload due to his low EF of 25-30% and CHF history. Pt also has moderate pulmonary HTN which increases his risk of complications with anesthesia.      Cardiology consult was done and pt was considered intermediate risk with no cardiac contraindications.      Pt may  have GETA vs GA with LMA with strict attention to pt's fluid status and avoiding hypercarbia. Pt may need ICU care postop if any complications during surgery. Possible placement of Arterial line during surgery for closer monitoring of pt.  Pt to be reassessed prior to his procedure and full preop/consent done at that time.     Current cardiac studies copied from his chart:     ECG 04/27/2021:   sinus rhythm, 95 bpm, right bundle branch block, frequent isolated PVCs.  Right bundle branch block and PVCs are not new.     Chest Xray yesterday: No acute cardiopulmonary process.     TTE 04/09/21:  Summary    * The left ventricular cavity size is dilated.  Left ventricular wall  thickness is normal.  Left ventricular systolic function is severely decreased  with an estimated ejection fraction of 25-30%.    * The right ventricular cavity size is upper limits of normal in size.  Mildly decreased right ventricular systolic function.    * Severe bi-atrial dilation.    * There is a 20mm Sapien 3 TAVR valve in aortic position that is normally  functioning.    * There is severe tricuspid regurgitation.  Moderate pulmonary hypertension  with estimated right ventricular systolic pressure of 54 mmHg.    * There is moderate pulmonic valve regurgitation.    * There is mild to moderate mitral regurgitation.    * Elevated right  atrial pressure.    * Compared to the prior study dated 08/25/2019,   tricuspid regurgitation increased. There are frequent PVC's throughout the  length of this study.

## 2021-04-28 NOTE — Plan of Care (Signed)
Pt AO X 3 , drowsy and respond to voice. VSS,  No complain of pain or discomfort. have BM during this shift. currently NPO and IVF is running D5%NS 75 ml/hr. Pt on RA, no SOB or respiratory distress noted. Son updated about plan of care. RVM in room for safety.   Surgery team following him and planned to do the surgery this week after cardiac risk is r/o.  Safety and fall precautions remain in place. Purposeful rounding completed.    Care Plan    Problem: Moderate/High Fall Risk Score >5  Goal: Patient will remain free of falls  Outcome: Progressing  Flowsheets (Taken 04/28/2021 1659)  High (Greater than 13):   HIGH-Visual cue at entrance to patient's room   HIGH-Initiate use of floor mats as appropriate   HIGH-Apply yellow "Fall Risk" arm band   HIGH-Bed alarm on at all times while patient in bed   MOD-Place Fall Risk level on whiteboard in room   MOD-Request PT/OT consult order for patients with gait/mobility impairment   MOD-Utilize diversion activities     Problem: Altered GI Function  Goal: Elimination patterns are normal or improving  Outcome: Progressing  Flowsheets (Taken 04/28/2021 0230 by Dellis Anes, RN)  Elimination patterns are normal or improving:   Monitor for abdominal distension   Monitor for abdominal discomfort   Administer treatments as ordered   Anticipate/assist with toileting needs   Report abnormal assessment to physician   Assess for normal bowel sounds     Problem: Safety  Goal: Patient will be free from injury during hospitalization  Outcome: Progressing  Flowsheets (Taken 04/28/2021 0229 by Dellis Anes, RN)  Patient will be free from injury during hospitalization:   Assess patient's risk for falls and implement fall prevention plan of care per policy   Use appropriate transfer methods   Provide and maintain safe environment   Ensure appropriate safety devices are available at the bedside   Include patient/ family/ care giver in decisions related to safety   Hourly rounding

## 2021-04-28 NOTE — Progress Notes (Signed)
[facility]  PROGRESS NOTE      Patient: Rick [name]  Date: 04/28/2021   LOS: 1 Days  Admission Date: 04/27/2021   MRN: [id number]  Attending: Vella Raringahul Shankar Sheddrick Lattanzio, MD  Please contact me on the following Spectralink [phone]           SUBJECTIVE     Fever No    Eating No    Cough No    Diarrhea No    Sleep Yes  Voiding Yes    MEDICATIONS     Current Facility-Administered Medications   Medication Dose Route Frequency    enoxaparin  40 mg Subcutaneous Q24H SCH    sacubitril-valsartan  0.5 tablet Oral Q12H SCH       PHYSICAL EXAM     Vitals:    04/28/21 1153   BP: 134/54   Pulse: (!) 42   Resp: 13   Temp: 98.1 F (36.7 C)   SpO2: 98%       Temperature: Temp  Min: 97.9 F (36.6 C)  Max: 98.6 F (37 C)  Pulse: Pulse  Min: 42  Max: 92  Respiratory: Resp  Min: 13  Max: 20  Non-Invasive BP: BP  Min: 118/54  Max: 222/90  Pulse Oximetry SpO2  Min: 95 %  Max: 99 %          Intake and Output Summary (Last 24 hours) at Date Time    Intake/Output Summary (Last 24 hours) at 04/28/2021 1228  Last data filed at 04/28/2021 0837  Gross per 24 hour   Intake 10 ml   Output 150 ml   Net -140 ml         GEN APPEARANCE: Patient looks tired, dry  HEENT: PERLA; Icterus No    NECK: Supple; No bruits  CVS: RRR, S1, S2; systolic murmur with valvular click  LUNGS: Air Entry Equal; No Wheezes; No Rhonchi: No rales  ABD: Soft; Bowl Sounds; Yes  GU: Foley No  R inguinal hernia has been reduced  EXT: No edema; Pulses 2+ and intact  Skin exam: Cold No    NEURO: CN 2-12 intact; No Focal neurological deficits: No Flaps      Exam done by Vella Raringahul Shankar Rosaline Ezekiel, MD on 04/28/21 at 12:28 PM      LABS     Recent Labs   Lab 04/28/21  0406 04/27/21  1814   WBC 5.42 4.22   RBC 3.50* 3.73*   Hgb 12.0* 12.8   Hematocrit 36.8* 38.9   MCV 105.1* 104.3*   Platelets 85* 83*       Recent Labs   Lab 04/28/21  0406 04/27/21  1814   Sodium 145 141   Potassium 4.6 4.6   Chloride 108 105   CO2 29 25   BUN 37.0* 40.0*   Creatinine 1.4 1.4   Glucose 108*  143*   Calcium 9.9 10.0       Recent Labs   Lab 04/27/21  1814   ALT 16   AST (SGOT) 26   Bilirubin, Total 0.7   Albumin 3.9   Alkaline Phosphatase 69       Recent Labs   Lab 04/27/21  1814   hs Troponin-I 17.6             Microbiology Results (last 15 days)       Procedure Component Value Units Date/Time    COVID-19 (SARS-CoV-2) only ([facility]) asymptomatic admission [[id number]][[id number]] Collected: 04/27/21 2146    Order Status:  Completed Specimen: Nasopharyngeal Updated: 04/27/21 2213     Purpose of COVID testing Screening     SARS-CoV-2 Specimen Source Nasal Swab     SARS CoV 2 Overall Result Not Detected     Comment: __________________________________________________  -A result of "Detected" indicates POSITIVE for the    presence of SARS CoV-2 RNA  -A result of "Not Detected" indicates NEGATIVE for the    presence of SARS CoV-2 RNA  __________________________________________________________  Test performed using the Roche cobas [facility] SARS-CoV-2 assay. This assay is  only for use under the Food and Drug Administration's Emergency Use  Authorization. This is a real-time RT-PCR assay for the qualitative  detection of SARS-CoV-2 RNA. Viral nucleic acids may persist in vivo,  independent of viability. Detection of viral nucleic acid does not imply the  presence of infectious virus, or that virus nucleic acid is the cause of  clinical symptoms. Negative results do not preclude SARS-CoV-2 infection and  should not be used as the sole basis for diagnosis, treatment or other  patient management decisions. Negative results must be combined with  clinical observations, patient history, and/or epidemiological information.  Invalid results may be due to inhibiting substances in the specimen and  recollection should occur. Please see Fact Sheets for patients and providers  located:  WirelessDSLBlog.no         Narrative:      o Collect and clearly label specimen type:  o PREFERRED-Upper respiratory  specimen: One Nasal Swab in  Transport Media.  o Hand deliver to laboratory ASAP  Indication for testing->Extended care facility admission to  semi private room  Screening    COVID-19 (SARS-CoV-2) only ([facility]) asymptomatic admission [[id number]] Collected: 04/27/21 2146    Order Status: Sent Specimen: Nasopharyngeal Updated: 04/27/21 2146    Narrative:      o Collect and clearly label specimen type:  o PREFERRED-Upper respiratory specimen: One Nasal Swab in  Transport Media.  o Hand deliver to laboratory ASAP  Indication for testing->Extended care facility admission to  semi private room  Screening             RADIOLOGY     CT Abd/Pelvis without Contrast    Result Date: 04/27/2021   1. Small bowel obstruction with transition point at site of new right-sided inguinal hernia. 2. Remainder as above. Ivin Poot, MD 04/27/2021 7:32 PM    Chest AP Portable    Result Date: 04/27/2021   Feeding tube noted with distal tip suspected to be in a right lower lobe bronchus. Provided history that patient's ER clinician Dr. Lucianne Muss was already aware and removed the patient's feeding tube. Ivin Poot, MD 04/27/2021 8:12 PM    XR Abdomen Portable    Result Date: 04/28/2021  1. Nasogastric tube in abnormal position. This finding was discussed with the patient's nurse, Oluchi, on 04/28/2021 12:39 AM . 2. Findings of small bowel obstruction. Jorene Guest, MD 04/28/2021 12:39 AM      ASSESSMENT/PLAN     Rick [name] is a 88 y.o. male admitted with Incarcerated inguinal hernia    Interval Summary:     Patient Active Hospital Problem List:    Right inguinal hernia  Small bowel obstruction  -Presented with right lower quadrant abdominal pain that started earlier today  -Hemodynamically stable.  Abdomen is soft and nontender  -CT A/P showed small bowel obstruction with transition point at site of new right-sided inguinal hernia.   -Evaluated by Dr. Allena Katz, surgery and hernia reduced  -  Plan: Hernia surgery planned after cardiac risk  stratification.  Plan for surgery in a.m.  Keep patient on maintenance IV fluids.  Currently asymptomatic.  Looks dehydrated.     Recent acute pulmonary embolism with cor pulmonale    AS s/p TAVR 2021, hypertension, HLD who was recently admitted with acute PE and discharged home on Eliquis, now presented to the hospital on 04/27/2021 with right inguinal hernia with severe pain.     CAD status post CABG  Cardiomyopathy  Combined systolic and diastolic chronic congestive heart failure  Nonrheumatic aortic valve stenosis (12/23/2018)     S/P TAVR (transcatheter aortic valve replacement)  -LVEF 25 to 30%  -Monitor I&O and daily weight  Previously could not tolerate Aldactone and Jardiance due to hypotension  Plan: Patient is n.p.o. and looks dehydrated . hold Lasix for now.      Hypertension  -Resumed Entresto in a.m. if vitals are stable    Vascular dementia (04/28/2021)  -On Namenda     Nutrition  N.p.o.     DVT/VTE Prophylaxis  Lovenox, Resume Eliquis after surgery         Signed,  Dong Nimmons Kathreen Cosier, MD  12:28 PM 04/28/2021

## 2021-04-28 NOTE — Progress Notes (Addendum)
Pt admitted to the unit with NG tube with no order  or note  in regards to the NG tube.  MD notified stated that pt technically does not need NGT since the hernia was reduced.. MD order STAT abdominal x ray . Result as follows: Nasogastric tube in abnormal position, findings of  small SBO.  MD Dr Craige Cotta notified of the result. MD advised RN to assess pt and D/C NG tube if pt is asymptomatic ( no abdominal distention, pain or vomiting). On assessment pt was asymptomatic NG tube discontinued, charge nurse made aware

## 2021-04-28 NOTE — Progress Notes (Cosign Needed)
Spectralink [phone]  Office [phone]    ASSESSMENT:  52M with PMH of CAD s/p CABG, aortic valve,  TAVR, HFrEF, dementia, PE and known R inguinal hernia presented with incarcerated R inguinal hernia now s/p reduction bedside on Eliquis from prior PE     PLAN:  - can give CLD/diet as tolerated while patient is optimized for surgery  - appreciate cardiology/medical risk stratification for OR  - hold eliquis to allow washout, 48 hours prior to OR if last dose was 3/12 plan for OR 3/14 unless prohibitive risk  - abdominal truss/avoiding straining in the mean time  - bowel regimen  - PT/OT- have patient OOB as much as possible  - rest of care per primary team    SUBJECTIVE:  R ing hernia reduced yesterday by team, NGT in plan with no output recorded overnight and removed.   Pt denies abdominal pain, denies N/V  Elevated BNP     OBJECTIVE:  Vitals:    04/27/21 2100 04/27/21 2200 04/27/21 2247 04/28/21 0352   BP: 118/54 148/67 131/57 129/58   Pulse: (!) 47 (!) 49 62 65   Resp: 15 14 16 20    Temp:  98.6 F (37 C) 97.9 F (36.6 C) 97.9 F (36.6 C)   TempSrc:  Oral Oral Oral   SpO2: 97% 99% 98% 98%   Weight:   64.2 kg (141 lb 8.6 oz)        General -- NAD, alert and cooperative, AOx3 however doesn't recall last eliquis dose  HEENT -- trachea midline, sclerae anicteric dry mucus membranes  CV -- RRR  Pulm -- non labored breathing, RA  Abd -- soft, nondistended, nontender, no rebound or guarding  R groin with reduced ing hernia, nontender to palpation   Extr -- warm, no edema  Neuro -- grossly intact, no focal deficits       LABS:  Lab Results   Component Value Date    WBC 5.42 04/28/2021    HGB 12.0 (L) 04/28/2021    HCT 36.8 (L) 04/28/2021    MCV 105.1 (H) 04/28/2021    PLT 85 (L) 04/28/2021     Recent Labs   Lab 04/28/21  0406   Sodium 145   Potassium 4.6   Chloride 108   CO2 29   BUN 37.0*   Creatinine 1.4   EGFR 57.0   Glucose 108*   Calcium 9.9       Lab Results   Component Value Date    ALT 16 04/27/2021     AST 26 04/27/2021    ALKPHOS 69 04/27/2021    BILITOTAL 0.7 04/27/2021       RADIOLOGY:  CT Abd/Pelvis without Contrast    Result Date: 04/27/2021   1. Small bowel obstruction with transition point at site of new right-sided inguinal hernia. 2. Remainder as above. Ivin Poot, MD 04/27/2021 7:32 PM    XR Chest 2 Views    Result Date: 04/08/2021   No acute pulmonary infiltrates are demonstrated. Merri Ray, MD  04/08/2021 2:36 PM    CT Angio Chest (PE study)    Result Date: 04/08/2021  1. Segmental and subsegmental pulmonary emboli in the right lower lobe. Mild straightening of the interventricular septum as well as reflux of contrast into the intrahepatic IVC, suggesting right heart strain. These results were discussed with Dr. Brett Canales REDDEN on 04/08/2021 at 5:57 PM on Epic secure messaging. Aldean Ast, MD  04/08/2021 5:58 PM    Chest AP Portable  Result Date: 04/27/2021   Feeding tube noted with distal tip suspected to be in a right lower lobe bronchus. Provided history that patient's ER clinician Dr. Lucianne Muss was already aware and removed the patient's feeding tube. Ivin Poot, MD 04/27/2021 8:12 PM    US Venous Low Extrem Duplx Dopp Comp Bilat    Result Date: 04/09/2021   NO EVIDENCE OF INTRALUMINAL THROMBUS OR OBSTRUCTION TO VENOUS FLOW IN RIGHT OR LEFT LOWER EXTREMITY. PULSATILE VENOUS FLOW MAY REFLECT RIGHT HEART DYSFUNCTION AND/OR FLUID OVERLOAD. Wyvonne Lenz, MD  04/09/2021 12:23 PM    XR Abdomen Portable    Result Date: 04/28/2021  1. Nasogastric tube in abnormal position. This finding was discussed with the patient's nurse, Oluchi, on 04/28/2021 12:39 AM . 2. Findings of small bowel obstruction. Jorene Guest, MD 04/28/2021 12:39 AM     Jill Side Bess Harvest, MD  General Surgery PGY-4

## 2021-04-28 NOTE — UM Notes (Signed)
[facility] Utilization Review  NPI: [id number], Tax ID: [id number]  Please Call [phone] with any questions or concerns. Confidential voicemail.  Fax final authorizations and requests for additional information Fax to [phone]     Patient: Matheo Rathbone  Date: 04/27/2021   DOB: [birth year]  Admission Date: 04/27/2021     Inpatient clinical 04/27/21  Incarcerated inguinal hernia  Anticoagulated  gen surg on call, NG tube, admit to medicine, likely surgery later tonight.     HPI: Michaell Emari Demmer is a 88 y.o. male with a PMHx of CAD status post CABG, cardiomyopathy, combined systolic and diastolic congestive heart failure, hypertension, dyslipidemia, dementia who was last admitted on 04/09/2021 with acute pulmonary embolism with cor pulmonale and currently on Eliquis presented to the ED after he was transferred from [facility] right inguinal hernia with severe pain.  Patient was evaluated by Dr. Allena Katz and his hernia reduced.    Patient reported that he started to have right lower abdominal pain since earlier today.  He denies vomiting.  He had a bowel movement yesterday and he is passing gas.  He denies chest pain, cough, shortness of breath.  He denies urinary symptoms.  He currently denies abdominal pain.  In the ED, BP 222/90 with pulse rate of 90 and temperature of 98.5 saturating 95%.  His lab work revealed WBC of 4.2, hemoglobin 12.8, creatinine 1.4, troponin 17.6 and proBNP of 1530.  UA negative for nitrite and leukocyte esterase.  CT A/P showed small bowel obstruction with transition point at site of new  right-sided inguinal hernia.  Initial chest x-ray showed feeding tube noted with distal tip suspected to be in a right lower lobe bronchus [NG tube was removed out by the ED].  LVEF 25 to 30% on echo on 04/09/2021.  He received morphine in the ED.  Hospitalist service consulted for further evaluation and management.  PHYSICAL EXAM   BP 154/65   Pulse (!) 58   Temp 98.4 F  (36.9 C)   Resp 14   SpO2 99%   GU: Large exquisitely tender irreducible right inguinal hernia.  Lab 04/27/21  1814   hs Troponin-I 17.6     NT-proBNP 1,530 High      BUN 40.0 High      37.0 High        Creatinine 1.4     1.4       Hemoglobin 12.8     12.0 Low        Hematocrit 38.9     36.8 Low        WBC 4.22     5.42       Platelets 83 Low      85 Low        Glucose 143 High      108 High       XR Abdomen Portable   Chest AP Portable  Feeding tube noted with distal tip suspected to be in a right lower lobe bronchus. Provided history that patient's ER clinician Dr. Lucianne Muss was already aware and removed the patient's feeding tube.  CT Abd/Pelvis without Contrast   1. Small bowel obstruction with transition point at site of new right-sided inguinal hernia.  2. Remainder as above.  Administration from 04/27/2021 1733 to 04/27/2021 2238   Date/Time Order Dose Route Action Action by Comments    04/27/2021 1816 EDT morphine injection 4 mg 4 mg Intravenous Given Mosetta Putt, RN --    04/27/2021 1815 EDT ondansetron (ZOFRAN)  injection 4 mg 4 mg Intravenous Given Mosetta Putt, RN --   Provider Notes:   Patient presenting to the ED with incarcerated right inguinal hernia.  Multiple medical problems include bilateral PE on Eliquis, congestive heart failure, TAVR, dementia.  He is full code.  Discussed with patient's son as above. Patient was transferred to [facility] for further management.  Diagnosis    Clinical Impression:   Incarcerated inguinal hernia    Admit to Inpatient (Order [id number])  Admission  Date: 04/27/2021 Department: [facility] Medical     ASSESSMENT & PLAN      Sotirios Marcial Pless is a 88 y.o. male admitted under medicine with <principal problem not specified>.     Patient Active Hospital Problem List:     No active hospital problems.     Assessment and plan:  1.  Right inguinal hernia       Small bowel obstruction  -Presented with right lower quadrant abdominal pain that started  earlier today  -Hemodynamically stable.  Abdomen is soft and nontender  -CT A/P showed small bowel obstruction with transition point at site of new right-sided inguinal hernia.   -Evaluated by Dr. Allena Katz, surgery and hernia reduced  -Keep n.p.o.  -Hold Eliquis for tonight  -NG tube in place.  We will do AXR  -Electronic consult for cardiology clearance placed.  Touch base with cardiology in the morning  -Monitor vitals  -Pain control  -Follow-up with surgery consult     2.  Recent acute pulmonary embolism with cor pulmonale   -Patient has been on Eliquis.  Holding Eliquis for tonight pending surgery evaluation in the morning.  Resume as indicated     2.CAD status post CABG  -Denies chest pain or chest tightness     3.Cardiomyopathy    Combined systolic and diastolic congestive heart failure  -LVEF 25 to 30%  -Monitor I&O and daily weight  - Hold Lasix for now     4. Hypertension  -Resume Entresto in a.m. if vitals are stable     5.Dyslipidemia  -On Crestor     6.Dementia   -On Namenda     Nutrition  N.p.o.     DVT/VTE Prophylaxis  Resume Eliquis after surgery evaluation in the morning     Anticipated medical stability for discharge: TBD  Service status/Reason for ongoing hospitalization: Right inguinal hernia  Anticipated Discharge Needs: TBD     CSR 04/28/21    SURGERY ASSESSMENT:  39M with PMH of CAD s/p CABG, aortic valve,  TAVR, HFrEF, dementia, PE and known R inguinal hernia presented with incarcerated R inguinal hernia now s/p reduction bedside on Eliquis from prior PE      PLAN:  - can give CLD/diet as tolerated while patient is optimized for surgery  - appreciate cardiology/medical risk stratification for OR  - hold eliquis to allow washout, 48 hours prior to OR if last dose was 3/12 plan for OR 3/14 unless prohibitive risk  - abdominal truss/avoiding straining in the mean time  - bowel regimen  - PT/OT- have patient OOB as much as possible  - rest of care per primary team    CARDIOLOGY ASSESSMENT/PLAN:   88  y.o. male with a history of CAD s/p CABG 2003 while living in [place] (LHC 06/2019 with patent LIMA to LAD, patent SVG to OM1, patent SVG to OM2 , patent SVG to RCA), chronic HFrEF (class III symptoms historically), cardiomyopathy LVEF of 30% (mixed), and AS s/p TAVR 2021,  hypertension, HLD who was recently admitted with acute PE and discharged home on Eliquis, now presented to the hospital on 04/27/2021 with right inguinal hernia with severe pain.     #Right inguinal hernia with small bowel obstruction-has been seen by surgery.  Plan is for surgical intervention this week.     #chronic HFrEF-at baseline his activity is fairly limited by balance, uses a cane. Does not typically complain of shortness of breath with his normal light activity.  Not acutely decompensated.  Has been doing well on Lasix 40 mg daily at home since last admission.  -Volume management: Lasix currently on hold given patient's limited dietary status.  Will monitor volume without diuretic.  In the past he had not been on a daily diuretic, that was started in February.  -GDMT: Not chronically on a beta-blocker due to history of bradycardia.  We will restart usual low-dose Entresto.  -Strict I's and O's, standing daily weights, fluid and sodium restriction     #Mixed ischemic/nonischemic cardiomyopathy-history of ejection fraction 30% in 2021, felt to be mixed etiology.  Echocardiogram this admission ejection fraction 20-30%.  -GDMT as above     #CAD-history of CABG in [place] in 2003.  Prior to TAVR in 2021 left heart catheterization showed patent bypass grafts as detailed.  He is having no anginal symptoms.  High sensitive troponin is normal.  No ischemic changes on EKG.   -Continue Crestor  -Not on aspirin.  Typically on full dose anticoagulation with Eliquis for his recent PE.     #Aortic stenosis, status post TAVR-echocardiogram February 2023 admission with normally functioning TAVR.     #Pulmonary hypertension/TR-echocardiogram  last admission with severe TR, moderate pulm hypertension.  That was in the setting of acute PE.     #Ventricular ectopy-history of frequent PVCs.  Also NSVT.  Last admission low-dose beta-blocker trial however patient had significant bradycardia therefore that was discontinued.  -We will start telemetry  -Monitor electrolytes and ectopy.     #Pulmonary embolism-recent diagnosis of pulm embolism in February 2023.  Eliquis on hold.  Last dose was 04/27/2021 morning.  Any potential bridging defer to general surgery and primary tea.     #Preoperative risk assessment-functional status is limited by balance and mobility, uses a cane and walker.  Has a known cardiomyopathy with stable HFrEF.  Not acutely decompensated.  History of CAD with patent bypass grafts in 2021.  No anginal symptoms.  Has a history of PVCs and NSVT without symptoms.  We will start telemetry to monitor.  As above has not tolerated beta-blocker in the past.  -Patient at least moderate risk for his planned procedure given his age and comorbidities but is stable from a cardiovascular standpoint without need for further cardiac testing, no cardiac contraindications to his necessary surgery.   Scheduled Meds:  Current Facility-Administered Medications   Medication Dose Route Frequency     Continuous Infusions:   dextrose 5 % and 0.9% NaCl       PRN Meds:.acetaminophen **OR** acetaminophen, Nursing communication: Adult Hypoglycemia Treatment Algorithm **AND** dextrose **AND** dextrose **AND** dextrose **AND** glucagon (rDNA), magnesium sulfate, naloxone, potassium & sodium phosphates, potassium chloride **AND** potassium chloride

## 2021-04-28 NOTE — ED Provider Notes (Signed)
[place] ACCEPTANCE NOTE FROM THE [facility]        Date: 04/27/2021  Patient Name: Rick [name]      [facility] TRANSFER NOTE  (paste note below)     Per Dr. Remus BlakeKumar's note:  "Patient presenting to the ED with incarcerated right inguinal hernia.  Multiple medical problems include bilateral PE on Eliquis, congestive heart failure, TAVR, dementia.  He is full code.  Discussed with patient's son as above. [facility]."    [place] ACCEPTANCE NOTE     I have assumed care of Rick [name] at 9:07 PM.  I have evaluated the patient and reviewed all pertinent results and findings, including the [facility] transfer note above.  The reason for the transfer is that the patient requires further labs, imaging, and/or consultations that are not currently available at the [facility]. Requires admission and surgical consultation.     CLINICAL COURSE     Vital Signs:BP 146/57   Pulse 64   Temp 98.1 F (36.7 C) (Oral)   Resp 20   Ht 5\' 8"  (1.727 m)   Wt 66.5 kg   SpO2 97%   BMI 22.29 kg/m     Physical Exam  Vitals and nursing note reviewed.   Constitutional:       Appearance: He is well-developed. He is not diaphoretic.      Comments: Appears nontoxic.  Resting comfortably in bed.   HENT:      Head: Normocephalic and atraumatic.      Nose: Nose normal.      Mouth/Throat:      Mouth: Mucous membranes are moist.   Eyes:      Conjunctiva/sclera: Conjunctivae normal.      Pupils: Pupils are equal, round, and reactive to light.   Neck:      Vascular: No JVD.   Cardiovascular:      Rate and Rhythm: Normal rate and regular rhythm.      Pulses: Normal pulses.      Heart sounds: Normal heart sounds.      Comments: Regular pulses, palpable in all 4 extremities. No pedal edema.  Pulmonary:      Effort: Pulmonary effort is normal. No respiratory distress.      Breath sounds: Normal breath sounds.      Comments: Normal work of breathing, clear lungs, no  tachypnea, no respiratory distress.  Abdominal:      General: Bowel sounds are normal. There is no distension.      Palpations: Abdomen is soft.      Tenderness: There is no abdominal tenderness.      Comments: Hernia has been reduced by General Surgery and is no longer bulging.  Abdomen soft, NTND, NABS. No guarding, rebound or rigidity.    Musculoskeletal:         General: No swelling or tenderness. Normal range of motion.      Cervical back: Normal range of motion and neck supple.   Lymphadenopathy:      Cervical: No cervical adenopathy.   Skin:     General: Skin is warm and dry.      Findings: No rash.   Neurological:      General: No focal deficit present.      Mental Status: He is alert and oriented to person, place, and time.      Motor: No abnormal muscle tone.   Psychiatric:         Mood and Affect: Mood  normal.         Behavior: Behavior normal.       ED Course:       MEDICAL DECISION MAKING     Personal Protective Equipment (PPE)  Gloves, N95 and surgical mask.    Provider Note: 88 y.o. male transferred to [facility] ED from [facility] free-standing ED for surgery consultation and admission.  Patient with multiple chronic conditions, including cardiomyopathy, CAD, CHF, HLD, HTN, PE on Eliquis, presented [facility] earlier tonight with incarcerated hernia, which was unable to be reduced at [facility].  On arrival patient was evaluated at bedside by General Surgery Dr. Allena Katz, who was able to reduce the hernia.  NGT placed at [facility] ED. Dr. Allena Katz is requesting admission to medicine, NPO, hold Eliquis, cardiology evaluation for surgical clearance. [facility] ED Dr. Lucianne Muss already spoke with ICU Dr. Velda Shell, who deemed patient stable for admission to a non-ICU bed.  Dr. Lucianne Muss also discussed the case with [facility] Dr. Dan Humphreys via Secure Chat.  I updated Dr. Dan Humphreys on the above events since patient's arrival to [facility] ED and she accepted patient for admission, Inpatient Acute Care bed.  Patient agrees with the plan and is stable for  transport upstairs.    Problems Addressed:    Patient's care impacted by chronic condition(s): PE, cardiomyopathy, CHF, CAD.  Risk factors for surgical intervention.  The differential diagnosis associated with patient's presentation: Addressed presenting complaint by reviewing labs ordered at [facility], speaking with Gen Surgery and admitting patient to [facility]. Ruled out intra-abdominal infection or perforation due to abdominal CT scan results not confirming these findings.    Disposition: Admit to [employer] and Complexity of Data Reviewed:    Pulse Oximetry Analysis:  Interpreted by me.  99% on RA- Normal  Cardiac Monitor: Interpreted by me. Rhythm:  Sinus Bradycardia, Rate:  Bradycardic, Ectopy:  None    EKG: Interpreted by me, the Emergency Physician.   Time Interpreted: 2108  Rate: 95  Rhythm: Normal Sinus Rhythm   Interpretation: QRS 100, QTc 397, nonspecific ST abnormalities diffusely comparison: No change compared to prior EKG dated 04/10/2021.    Labs: All labs have been reviewed and interpreted by me.  Labwork with chronic thrombocytopenia and chronically elevated BNP at 1530, reassuring lipase, troponin, CMP, WBC, H/H, UA without infection or blood, COVID swab negative.  CT: reviewed  by me, confirmed by radiology report.  CT abdomen/pelvis with SBO with transition point at the right-sided inguinal hernia.    As a result of the findings as above, I chose the following management: Case discussion with General Surgery, admission to [facility]     Management of the patient was discussed with General Surgery Dr. Allena Katz, who recommended admission to medicine, hold Eliquis, NPO, inpatient cardiology consultation for clearance for surgery.  Case was discussed with [facility] Dr. Dan Humphreys, who accepted patient for admission.    Patient external medical records from 03/22/2021 reviewed: Hospitalist discharge summary--patient was admitted for bilateral pulmonary emboli and started on Eliquis.      Previous labs from  04/10/2021 reviewed and showed: BNP chronically elevated at 4533, troponin is reassuring, chronic thrombocytopenia.    Previous imaging from 04/08/2021 reviewed and showed: CTA chest with RLL PEs with evidence of right heart strain.      Risk    I considered management with IV Zosyn, but did not utilize this approach due to no evidence of infectious process or intra-abdominal perforation at this time and low suspicion for bacterial  translocation.      __________________________________________________________________    For Hospitalized Patients:    Hospitalization Decision Time:  The decision to admit this patient was made by the emergency provider at 2134 on 04/27/2021.     I have discussed this case with Dr. Mikal Plane, who accepts patient for admission and requests Inpatient Acute Care (Floor) bed.     For Surgical/Procedural Admissions:    Anticoagulated: Yes. If yes, name of medication: Eliquis  Last PO intake: Breakfast this morning  Current NPO status: NPO    Consultant(s):     I have discussed this case with consultant General Surgery Dr. Allena Katz at 2115.  Recommendations were as follows: See Provider Summary      Core Measures:     - 12-lead EKG was performed in the ED. Aspirin: Not indicated, preop    SEP-1 Charting   A. ------------Severe Sepsis/Septic Shock Exclusion----------------------------    Pt is excluded from severe sepsis or septic shock consideration at 2134 [enter time of bed request placement] because of one or more reasons below:    No evidence of infectious process during ED stay.    DIAGNOSIS     Clinical Impression:   1. Incarcerated inguinal hernia    2. Anticoagulated        Treatment Plan:   ED Disposition       ED Disposition   Admit    Condition   --    Date/Time   Sun Apr 27, 2021  9:34 PM    Comment   Admitting Physician: Santa Genera [[id number]]   Service:: Medicine [106]   Estimated Length of Stay: > or = to 2 midnights   Tentative Discharge Plan?: Home or Self Care [1]   Does  patient need telemetry?: No                   The above diagnostic process was due to medical necessity based on risk stratification of potential harm of patient's presenting complaint.     CHART OWNERSHIP: This note is prepared by Ardis Hughs, MD, PHD, FACEP. I am the first provider for this patient.    This note was generated by the Epic EMR system/ Dragon speech recognition and may contain inherent errors or omissions not intended by the user. Grammatical errors, random word insertions, deletions and pronoun errors  are occasional consequences of this technology due to software limitations. Not all errors are caught or corrected. If there are questions or concerns about the content of this note or information contained within the body of this dictation they should be addressed directly with the author for clarification.    Electronically signed by Ardis Hughs, MD, PHD, Brayton Caves, MD PhD  04/28/21 2029

## 2021-04-28 NOTE — Progress Notes (Signed)
Initial Case Management Assessment and Discharge Planning  [facility]   Patient Name: Rick [name], Rick [name]   Date of Birth [birth year]   Attending Physician: Vella Raring, *   Primary Care Physician: Erasmo Score, MD   Length of Stay 1   Reason for Consult / Chief Complaint IDPA        Situation   Admission DX:   1. Incarcerated inguinal hernia    2. Anticoagulated        A/O Status: X 3    LACE Score: 10    Patient admitted from: ER  Admission Status: inpatient    Health Care Agent: Self  Name:   Phone number:        Background     Advanced directive:   Not Received    Code Status:   Full Code     Residence: One story home/apartment    PCP: Erasmo Score, MD  Patient Contact:   [phone] (home)     [phone] (mobile)     Emergency contact:   Extended Emergency Contact Information  Primary Emergency Contact: Frisk II,Issak  Address: [address]           [place], [place] [address] [place]  Home Phone: [phone]  Mobile Phone: [phone]  Relation: Son  Secondary Emergency Contact: Elders,Debra  Home Phone: [phone]  Relation: Daughter in law      ADL/IADL's: Independent  Previous Level of function: 7 Independent     DME: None    Pharmacy:     [facility] [place], [place] - [address] [facility]  [address] [facility]  [place] [address]  Phone: [phone] Fax: [phone]    [facility] [address] [place] - [address]  [address]  [place] [address]  Phone: [phone] Fax: [phone]      Prescription Coverage: Yes    Home Health: The patient is not currently receiving home health services.    Previous SNF/AR:     COVID Vaccine Status: VACCINATED    Date First IMM given:   UAI on file?: No  Transport for discharge? Mode of transportation: Private Vehicle - Patient to drive self  Agreeable to Home with family post-discharge:  Yes     Assessment   Pending further evaluation, CM will  determine further needs.  BARRIERS TO DISCHARGE: NONE     Recommendation   D/C Plan A: Home with family    D/C Plan B: Home with family    D/C Plan C: Home with family

## 2021-04-28 NOTE — Plan of Care (Signed)
NURSING SHIFT NOTE     Patient: Rick [name]  Day: 1      SHIFT EVENTS     Shift Narrative/Significant Events (PRN med administration, fall, RRT, etc.):     Patient admitted to the unit, white board updated , call light within reach. AO X 3 , VSS,  No complain of pain or discomfort. During this shift. Pt on RA, no SOB or respiratory distress noted. Family members at the bedside during admission. RVM in room, pt trying to pull NG tube.    Four eyes skin assessment performed with [Farai CT].  Refer to Epic assessment flowsheet for skin assessment.     Safety and fall precautions remain in place. Purposeful rounding completed.          ASSESSMENT     Changes in assessment from patient's baseline this shift:    Neuro: No  CV: No  Pulm: No  Peripheral Vascular: No  HEENT: No  GI: No  BM during shift: No, Last BM:    GU: No   Integ: No  MS: No    Pain: None  Pain Interventions:   Medications Utilized:     Mobility: PMP Activity: Step 3 - Bed Mobility of             Lines     Patient Lines/Drains/Airways Status       Active Lines, Drains and Airways       Name Placement date Placement time Site Days    Peripheral IV 04/27/21 20 G Right Antecubital 04/27/21  1805  Antecubital  less than 1    NG/OG Tube Nasogastric 14 Fr. Right nostril 04/27/21  2130  Right nostril  less than 1                         VITAL SIGNS     Vitals:    04/27/21 2247   BP: 131/57   Pulse: 62   Resp: 16   Temp: 97.9 F (36.6 C)   SpO2: 98%       Temp  Min: 97.9 F (36.6 C)  Max: 98.6 F (37 C)  Pulse  Min: 47  Max: 92  Resp  Min: 14  Max: 18  BP  Min: 118/54  Max: 222/90  SpO2  Min: 95 %  Max: 99 %    No intake or output data in the 24 hours ending 04/28/21 0231               CARE PLAN        Problem: Pain interferes with ability to perform ADL  Goal: Pain at adequate level as identified by patient  Flowsheets (Taken 04/28/2021 0230)  Pain at adequate level as identified by patient:   Identify patient comfort function goal   Assess for  risk of opioid induced respiratory depression, including snoring/sleep apnea. Alert healthcare team of risk factors identified.   Assess pain on admission, during daily assessment and/or before any "as needed" intervention(s)   Reassess pain within 30-60 minutes of any procedure/intervention, per Pain Assessment, Intervention, Reassessment (AIR) Cycle   Evaluate if patient comfort function goal is met   Evaluate patient's satisfaction with pain management progress     Problem: Altered GI Function  Goal: Elimination patterns are normal or improving  Flowsheets (Taken 04/28/2021 0230)  Elimination patterns are normal or improving:   Monitor for abdominal distension   Monitor for abdominal discomfort   Administer treatments  as ordered   Anticipate/assist with toileting needs   Report abnormal assessment to physician   Assess for normal bowel sounds     Problem: Safety  Goal: Patient will be free from injury during hospitalization  Flowsheets (Taken 04/28/2021 0229)  Patient will be free from injury during hospitalization:   Assess patient's risk for falls and implement fall prevention plan of care per policy   Use appropriate transfer methods   Provide and maintain safe environment   Ensure appropriate safety devices are available at the bedside   Include patient/ family/ care giver in decisions related to safety   Hourly rounding

## 2021-04-29 ENCOUNTER — Encounter
Admission: EM | Disposition: A | Discharge status: Home Health | Payer: Self-pay | Source: Other Acute Inpatient Hospital | Attending: Nephrology

## 2021-04-29 ENCOUNTER — Ambulatory Visit (INDEPENDENT_AMBULATORY_CARE_PROVIDER_SITE_OTHER): Payer: Medicare Other | Admitting: Cardiovascular Disease

## 2021-04-29 ENCOUNTER — Inpatient Hospital Stay: Payer: Medicare Other | Admitting: Anesthesiology

## 2021-04-29 DIAGNOSIS — D696 Thrombocytopenia, unspecified: Secondary | ICD-10-CM | POA: Diagnosis present

## 2021-04-29 HISTORY — PX: HERNIORRHAPHY, INGUINAL: SHX4196

## 2021-04-29 LAB — CBC
Absolute NRBC: 0 10*3/uL (ref 0.00–0.00)
Hematocrit: 37.9 % (ref 37.6–49.6)
Hgb: 12.3 g/dL — ABNORMAL LOW (ref 12.5–17.1)
MCH: 33.9 pg — ABNORMAL HIGH (ref 25.1–33.5)
MCHC: 32.5 g/dL (ref 31.5–35.8)
MCV: 104.4 fL — ABNORMAL HIGH (ref 78.0–96.0)
MPV: 12.6 fL — ABNORMAL HIGH (ref 8.9–12.5)
Nucleated RBC: 0 /100 WBC (ref 0.0–0.0)
Platelets: 80 10*3/uL — ABNORMAL LOW (ref 142–346)
RBC: 3.63 10*6/uL — ABNORMAL LOW (ref 4.20–5.90)
RDW: 13 % (ref 11–15)
WBC: 3.95 10*3/uL (ref 3.10–9.50)

## 2021-04-29 LAB — RENAL FUNCTION PANEL
Albumin: 3.1 g/dL — ABNORMAL LOW (ref 3.5–5.0)
Anion Gap: 8 (ref 5.0–15.0)
BUN: 26 mg/dL (ref 9.0–28.0)
CO2: 25 mEq/L (ref 17–29)
Calcium: 9.4 mg/dL (ref 7.9–10.2)
Chloride: 112 mEq/L — ABNORMAL HIGH (ref 99–111)
Creatinine: 1.3 mg/dL (ref 0.5–1.5)
Glucose: 122 mg/dL — ABNORMAL HIGH (ref 70–100)
Phosphorus: 3.4 mg/dL (ref 2.3–4.7)
Potassium: 4.6 mEq/L (ref 3.5–5.3)
Sodium: 145 mEq/L (ref 135–145)

## 2021-04-29 LAB — GFR: EGFR: >60

## 2021-04-29 SURGERY — HERNIORRHAPHY, INGUINAL
Anesthesia: Anesthesia General | Site: Groin | Laterality: Right | Wound class: Clean

## 2021-04-29 MED ORDER — ETOMIDATE 2 MG/ML IV SOLN
INTRAVENOUS | Status: DC | PRN
Start: 2021-04-29 — End: 2021-04-29
  Administered 2021-04-29: 18 mg via INTRAVENOUS

## 2021-04-29 MED ORDER — DEXAMETHASONE SODIUM PHOSPHATE 4 MG/ML IJ SOLN
INTRAMUSCULAR | Status: AC
Start: 2021-04-29 — End: ?
  Filled 2021-04-29: qty 1

## 2021-04-29 MED ORDER — BUPIVACAINE HCL (PF) 0.25 % IJ SOLN
INTRAMUSCULAR | Status: AC
Start: 2021-04-29 — End: ?
  Filled 2021-04-29: qty 10

## 2021-04-29 MED ORDER — ROCURONIUM BROMIDE 10 MG/ML IV SOLN (WRAP)
INTRAVENOUS | Status: DC | PRN
Start: 2021-04-29 — End: 2021-04-29
  Administered 2021-04-29: 5 mg via INTRAVENOUS
  Administered 2021-04-29: 20 mg via INTRAVENOUS

## 2021-04-29 MED ORDER — PROPOFOL 10 MG/ML IV EMUL (WRAP)
INTRAVENOUS | Status: DC | PRN
Start: 2021-04-29 — End: 2021-04-29
  Administered 2021-04-29: 30 mg via INTRAVENOUS

## 2021-04-29 MED ORDER — ONDANSETRON HCL 4 MG/2ML IJ SOLN
INTRAMUSCULAR | Status: DC | PRN
Start: 2021-04-29 — End: 2021-04-29
  Administered 2021-04-29: 4 mg via INTRAVENOUS

## 2021-04-29 MED ORDER — NEOSTIGMINE METHYLSULFATE 1 MG/ML IJ/IV SOLN (WRAP)
Status: AC
Start: 2021-04-29 — End: ?
  Filled 2021-04-29: qty 10

## 2021-04-29 MED ORDER — ETOMIDATE 2 MG/ML IV SOLN
INTRAVENOUS | Status: AC
Start: 2021-04-29 — End: ?
  Filled 2021-04-29: qty 20

## 2021-04-29 MED ORDER — FENTANYL CITRATE (PF) 50 MCG/ML IJ SOLN (WRAP)
INTRAMUSCULAR | Status: AC
Start: 2021-04-29 — End: ?
  Filled 2021-04-29: qty 2

## 2021-04-29 MED ORDER — BUPIVACAINE HCL (PF) 0.5 % IJ SOLN
INTRAMUSCULAR | Status: AC
Start: 2021-04-29 — End: ?
  Filled 2021-04-29: qty 30

## 2021-04-29 MED ORDER — SUCCINYLCHOLINE CHLORIDE 20 MG/ML IJ SOLN
INTRAMUSCULAR | Status: DC | PRN
Start: 2021-04-29 — End: 2021-04-29
  Administered 2021-04-29: 80 mg via INTRAVENOUS

## 2021-04-29 MED ORDER — CEFAZOLIN SODIUM 1 G IJ SOLR
INTRAMUSCULAR | Status: DC | PRN
Start: 2021-04-29 — End: 2021-04-29
  Administered 2021-04-29: 2 g via INTRAVENOUS

## 2021-04-29 MED ORDER — FENTANYL CITRATE (PF) 50 MCG/ML IJ SOLN (WRAP)
INTRAMUSCULAR | Status: DC | PRN
Start: 2021-04-29 — End: 2021-04-29
  Administered 2021-04-29: 25 ug via INTRAVENOUS
  Administered 2021-04-29: 50 ug via INTRAVENOUS

## 2021-04-29 MED ORDER — LIDOCAINE HCL 1 % IJ SOLN
INTRAMUSCULAR | Status: AC
Start: 2021-04-29 — End: ?
  Filled 2021-04-29: qty 20

## 2021-04-29 MED ORDER — BUPIVACAINE HCL 0.5 % IJ SOLN
INTRAMUSCULAR | Status: DC | PRN
Start: 2021-04-29 — End: 2021-04-29
  Administered 2021-04-29: 10 mL

## 2021-04-29 MED ORDER — LIDOCAINE HCL 1 % IJ SOLN
INTRAMUSCULAR | Status: DC | PRN
Start: 2021-04-29 — End: 2021-04-29
  Administered 2021-04-29: 10 mL

## 2021-04-29 MED ORDER — GLYCOPYRROLATE 0.2 MG/ML IJ SOLN (WRAP)
INTRAMUSCULAR | Status: DC | PRN
Start: 2021-04-29 — End: 2021-04-29
  Administered 2021-04-29: .4 mg via INTRAVENOUS
  Administered 2021-04-29 (×2): .2 mg via INTRAVENOUS

## 2021-04-29 MED ORDER — DEXAMETHASONE SODIUM PHOSPHATE 4 MG/ML IJ SOLN
INTRAMUSCULAR | Status: DC | PRN
Start: 2021-04-29 — End: 2021-04-29
  Administered 2021-04-29: 4 mg via INTRAVENOUS

## 2021-04-29 MED ORDER — LACTATED RINGERS IV SOLN
INTRAVENOUS | Status: DC | PRN
Start: 2021-04-29 — End: 2021-05-02

## 2021-04-29 MED ORDER — NEOSTIGMINE METHYLSULFATE 1 MG/ML IJ/IV SOLN (WRAP)
Status: DC | PRN
Start: 2021-04-29 — End: 2021-04-29
  Administered 2021-04-29 (×2): 2 mg via INTRAVENOUS

## 2021-04-29 SURGICAL SUPPLY — 58 items
ADHESIVE SKIN CLOSURE DERMABOND ADVANCED (Skin Closure) ×1
ADHESIVE SKIN CLOSURE DERMABOND ADVANCED .7 ML LIQUID APPLICATOR (Skin Closure) ×1 IMPLANT
ADHESIVE SKNCLS 2 OCTYL CYNCRLT .7ML (Skin Closure) ×1
BLADE 15 BARD-PARKER RIB-BACK SAFETYLOCK (Blade) ×1
BLADE 15 BARD-PARKER RIB-BACK SAFETYLOCK CARBON STEEL SURGICAL (Blade) ×1 IMPLANT
BLADE SRG CBNSTL 15 BP RB-BCK SFLOK LF (Blade) ×1
DRAIN INCS RBR STD PNRS .25IN 12IN LTX (Drain) ×1
DRAIN OD.25 IN SAFETY PIN RADIOPAQUE (Drain) ×1
DRAIN OD.25 IN SAFETY PIN RADIOPAQUE PENROSE L12 IN INCISION RUBBER (Drain) ×1 IMPLANT
DRAPE SRG CNTRL + SMS 14IN 4IN 120X100IN (Drape) ×2
DRAPE SURGICAL ARMBOARD COVER TRANSVERSE FENESTRATE TUBE HOLDER L120 (Drape) ×1 IMPLANT
ELECTRODE ADULT PATIENT RETURN L9 FT REM POLYHESIVE ACRYLIC FOAM (Procedure Accessories) ×1 IMPLANT
ELECTRODE ELECTROSURGICAL BLADE PENCIL L10 FT OD3/8 IN PLUMEPEN ELITE (Other) ×1 IMPLANT
ELECTRODE ESURG BLDE PNCL PLUMEPEN ELT (Other) ×2
ELECTRODE PATIENT RETURN L9 FT VALLEYLAB (Procedure Accessories) ×1
ELECTRODE PT RTN RM PHSV ACRL FM C30- LB (Procedure Accessories) ×1
GLOVE SRG PLISPRN 6.5 BGL PI ORTHOPRO (Glove) ×1
GLOVE SURGICAL 6 1/2 BIOGEL PI ORTHOPRO (Glove) ×1
GLOVE SURGICAL 6 1/2 BIOGEL PI ORTHOPRO POWDER FREE MICRO ROUGH (Glove) ×1 IMPLANT
KIT RM TURNOVER LF NS DISP (Kits) ×2
KIT ROOM TURNOVER NONSTERILE LATEX FREE DISPOSABLE (Kits) ×1 IMPLANT
MESH RECTANGLE INGUINAL HERNIA REPAIR (Mesh) ×1 IMPLANT
MESH RECTANGLE INGUINAL HERNIA REPAIR GROIN L6 IN X W3 IN (Mesh) ×1 IMPLANT
MESH SRG PP MFL RECT BARD MRLX 6X3IN LF (Mesh) ×1 IMPLANT
SOLUTION IRR 0.9% NACL 500ML LF STRL PLS (Irrigation Solutions) ×1
SOLUTION IRRIGATION 0.9% SDM CHLORIDE 500ML PR BTTL ISOTONIC NONPRGNC (Irrigation Solutions) ×1 IMPLANT
SOLUTION IRRIGATION 0.9% SODIUM CHLORIDE (Irrigation Solutions) ×1
SOLUTION SRGPRP 74% ISPRP 0.7% IOD (Prep) ×1
SOLUTION SURGICAL PREP 26 ML DURAPREP (Prep) ×1
SOLUTION SURGICAL PREP 26 ML DURAPREP 74% ISOPROPYL ALCOHOL 0.7% (Prep) ×1 IMPLANT
SPONGE GAUZE L6 3/4 IN X W6 IN MEDIUM (Dressing)
SPONGE GAUZE L6 3/4 IN X W6 IN MEDIUM ABSORBENT FLUFF DRY CRINKLE (Dressing) IMPLANT
SPONGE GZE CTTN MED KRLX 6.75X6IN LF (Dressing)
SUPPORT SCROTAL LARGE ADJUSTABLE ELASTIC (Patient Supply) ×1 IMPLANT
SUPPORT SCRT CTTN LG B-B LTX NS ADJ ELC (Patient Supply) ×1
SUPPORT SCRT CTTN MED B-B LTX NS ADJ ELC (Patient Supply) IMPLANT
SUTURE ABS 3-0 SH VCL 27IN BRD COAT VIOL (Suture) ×2
SUTURE ABS 4-0 PS2 MNCRL MTPS 18IN MFL (Suture) ×1
SUTURE COATED VICRYL 3-0 SH L27 IN BRAID (Suture) ×2
SUTURE COATED VICRYL 3-0 SH L27 IN BRAID COATED VIOLET ABSORBABLE (Suture) ×2 IMPLANT
SUTURE ETHIBOND EXCEL GREEN 0 MO-6 L18 (Suture) ×1
SUTURE ETHIBOND EXCEL GREEN 0 MO-6 L18 IN CONTROL RELEASE BRAID 8 (Suture) ×1 IMPLANT
SUTURE MONOCRYL 4-0 PS-2 L18 IN (Suture) ×1
SUTURE MONOCRYL 4-0 PS-2 L18 IN MONOFILAMENT UNDYED ABSORBABLE (Suture) ×1 IMPLANT
SUTURE NABSB 0 MO-6 EBND EXC 18IN CR BRD (Suture) ×1
SUTURE NABSB SLK 2-0 SH PRMHND 30IN BRD (Suture) ×1
SUTURE SILK PERMA HAND BLACK 2-0 SH L30 (Suture) ×1
SUTURE SILK PERMA HAND BLACK 2-0 SH L30 IN BRAID NONABSORBABLE (Suture) ×1 IMPLANT
SYRINGE IRR TVK 60ML LF STRL LID SFT BLB (Syringes, Needles) ×1
SYRINGE MEDLINE 60 ML LID SOFT BULB TIP (Syringes, Needles) ×1
SYRINGE MEDLINE 60 ML LID SOFT BULB TIP IRRIGATION TYVEK (Syringes, Needles) ×1 IMPLANT
SYSTEM IRR MTL VTVU YNKR 12FR LF STRL (Suction) ×2
SYSTEM IRRIGATION EXTEND SUCTION ILLUMINATION TUBE BULB TIP VITAL-VUE (Suction) ×1 IMPLANT
TRAY SRG LF STRL MIN DISP (Pack) ×1
TRAY SURGICAL MINOR (Pack) ×1 IMPLANT
TUBING SCT MDVC MXGR 9/32IN 12FT LF STRL (Suction) ×1
TUBING SUCTION ID9/32 IN L12 FT (Suction) ×1
TUBING SUCTION ID9/32 IN L12 FT NONCONDUCTIVE MALE TO MALE CONNECTOR (Suction) ×1 IMPLANT

## 2021-04-29 NOTE — Anesthesia Preprocedure Evaluation (Signed)
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      pertinent labs reviewed             Signed by: Harriet Butte, MD 04/29/21 11:32 AM

## 2021-04-29 NOTE — Progress Notes (Signed)
Spectralink [phone]  Office [phone]    ASSESSMENT:  44M with PMH of CAD s/p CABG, aortic valve,  TAVR, HFrEF, dementia, PE and known R inguinal hernia presented with incarcerated R inguinal hernia now s/p reduction bedside on Eliquis from prior PE now held for 48 hours and planning for OR for repair,    PLAN:  - NPO, judicious mIVF for OR today for inguinal hernia repair-RIGHT  - appreciate cardiology/medical risk stratification for OR - intermediate risk  - hold eliquis to allow washout, plan restarting 2 days post op.   - abdominal truss/avoiding straining  - bowel regimen  - PT/OT- have patient OOB as much as possible  - rest of care per primary team        The ACS Risk Calculator was utilized to estimate the patient's chance of post-operative complications due to existing co-morbidities.  The results were presented to the patient's son the power of attorney by phone in the pre-op holding area and discussed in detail.  Patient is high risk for adverse outcome due to underlying heart failure. The son understands and wants to proceed. ICU postop possible. All questions were answered and having understood, the patient agreed to proceed.     This patient was personally seen and examined by me and I agree with the assessment and plan above.  All notes, labs, and imaging if performed were reviewed. The patient was seen on the date the note was written.    To OR for right inguinal hernia repair with mesh     Rick Ramer C. Artis Delay, MD, MPH  [facility]  [address]  [place], [place] [address]  [phone]           SUBJECTIVE:  NAEO  Denies pain, vss, denies N/V/F/C/CP/SOB    OBJECTIVE:  Vitals:    04/28/21 2012 04/28/21 2252 04/28/21 2335 04/29/21 0346   BP: 152/73 158/72 143/71 150/70   Pulse: 66 80 61 60   Resp: 18  20 20    Temp: 97.9 F (36.6 C)  98.4 F (36.9 C) 98.2 F (36.8 C)   TempSrc: Oral  Oral Oral   SpO2: 98% 99% 98% 98%   Weight:       Height:           General -- NAD,  alert and cooperative, AOx3 however doesn't recall last eliquis dose  HEENT -- trachea midline, sclerae anicteric dry mucus membranes  CV -- RRR  Pulm -- non labored breathing, RA  Abd -- soft, nondistended, nontender, no rebound or guarding  R groin with reduced ing hernia, nontender to palpation   Extr -- warm, no edema  Neuro -- grossly intact, no focal deficits       LABS:  Lab Results   Component Value Date    WBC 3.95 04/29/2021    HGB 12.3 (L) 04/29/2021    HCT 37.9 04/29/2021    MCV 104.4 (H) 04/29/2021    PLT 80 (L) 04/29/2021     Recent Labs   Lab 04/29/21  0351   Sodium 145   Potassium 4.6   Chloride 112*   CO2 25   BUN 26.0   Creatinine 1.3   EGFR >60.0   Glucose 122*   Calcium 9.4         Lab Results   Component Value Date    ALT 16 04/27/2021    AST 26 04/27/2021    ALKPHOS 69 04/27/2021    BILITOTAL 0.7 04/27/2021  RADIOLOGY:  CT Abd/Pelvis without Contrast    Result Date: 04/27/2021   1. Small bowel obstruction with transition point at site of new right-sided inguinal hernia. 2. Remainder as above. Ivin Poot, MD 04/27/2021 7:32 PM    XR Chest 2 Views    Result Date: 04/08/2021   No acute pulmonary infiltrates are demonstrated. Merri Ray, MD  04/08/2021 2:36 PM    CT Angio Chest (PE study)    Result Date: 04/08/2021  1. Segmental and subsegmental pulmonary emboli in the right lower lobe. Mild straightening of the interventricular septum as well as reflux of contrast into the intrahepatic IVC, suggesting right heart strain. These results were discussed with Dr. Brett Canales REDDEN on 04/08/2021 at 5:57 PM on Epic secure messaging. Aldean Ast, MD  04/08/2021 5:58 PM    Chest AP Portable    Result Date: 04/27/2021   Feeding tube noted with distal tip suspected to be in a right lower lobe bronchus. Provided history that patient's ER clinician Dr. Lucianne Muss was already aware and removed the patient's feeding tube. Ivin Poot, MD 04/27/2021 8:12 PM    US Venous Low Extrem Duplx Dopp Comp Bilat    Result Date:  04/09/2021   NO EVIDENCE OF INTRALUMINAL THROMBUS OR OBSTRUCTION TO VENOUS FLOW IN RIGHT OR LEFT LOWER EXTREMITY. PULSATILE VENOUS FLOW MAY REFLECT RIGHT HEART DYSFUNCTION AND/OR FLUID OVERLOAD. Wyvonne Lenz, MD  04/09/2021 12:23 PM    XR Abdomen Portable    Result Date: 04/28/2021  1. Nasogastric tube in abnormal position. This finding was discussed with the patient's nurse, Ol[place], on 04/28/2021 12:39 AM . 2. Findings of small bowel obstruction. Jorene Guest, MD 04/28/2021 12:39 AM     Jill Side Bess Harvest, MD  General Surgery PGY-4

## 2021-04-29 NOTE — Brief Op Note (Addendum)
BRIEF OP NOTE    Date Time: 04/29/21 1:28 PM    Patient Name:   Rick [name], Rick [name] (MRN: [id number])    Date of Operation:   04/29/2021     Providers Performing:   Surgeon(s) and Role:     Towanda Octave, MD - Primary    Surgical First Assistant(s):   First Assistant: Dorthula Perfect    Operative Procedure:   OPEN RIGHT HERNIORRHAPHY, INGUINAL WITH MESH:     Preoperative Diagnosis:   Pre-Op Diagnosis Codes:     * Inguinal hernia, incarcerated [K40.30]    Postoperative Diagnosis:   Post-Op Diagnosis Codes:     * Inguinal hernia, incarcerated [K40.30]    Findings:   Incarcerated right inguinal hernia  Indirect with cord lipoma  Anesthesia:   General    Estimated Blood Loss:    * No values recorded between 04/29/2021 11:42 AM and 04/29/2021  1:28 PM *  < 5 cc  Implants:     Implant Name Type Inv. Item Serial No. Manufacturer Lot No. LRB No. Used Action   MESH SRG PP MFL RECT BARD MRLX 6X3IN LF - [id number] Mesh MESH SRG PP MFL RECT BARD MRLX 6X3IN LF  BARD DAVOL [id number] 1 Implanted           Drains:   Drains: no        Intentionally Retained Foreign Objects/Wound Packing:   Intentionally Retained Foreign Objects     Active IRFO     None              Specimens:   * No specimens in log *       Complications:   None     Signed by: Towanda Octave, MD                                                                           [facility]

## 2021-04-29 NOTE — Op Note (Addendum)
FULL OPERATIVE NOTE    Date Time: 04/29/21 10:44 PM  Patient Name: Rick [name], Rick [name] (MRN: [id number])  Attending Physician: Vella Raring, *      Date of Operation:   04/29/2021    Providers Performing:   Surgeon(s) and Role:     * Towanda Octave, MD - Primary    Surgical First Assistant(s):   First Assistant: Dorthula Perfect    Operative Procedure:   OPEN RIGHT HERNIORRHAPHY, INGUINAL WITH MESH:     Preoperative Diagnosis:   Pre-Op Diagnosis Codes:     * Inguinal hernia, incarcerated [K40.30]    Postoperative Diagnosis:   Post-Op Diagnosis Codes:     * Inguinal hernia, incarcerated [K40.30]    Anesthesia:   General    Findings:   Incarcerated right inguinal hernia  Indirect with cord lipoma    Indications:   Incarcerated right inguinal hernia with bowel obstruction    Operative Notes:   The patient was taken to the operating room and placed in the supine position, arterial line monitoring was placed by anesthesia and adequate general anesthesia was obtained. Antibiotics were administered.  A safety timeout was performed in the standard fashion.  The patient was shaved, prepped and draped in sterile fashion. The anterior superior iliac spine and pubic tubercle were identified and mark. Local was infiltrated in the skin obliquely between these land marks and an incision created. This was carried down through the skin and down to the external oblique fascia.  The spermatic cord and structures were mobilized and an indirect hernia with cord lipoma located. The  hernia sac was separated from the cord structures and internal ring, then reduced. The cord lipoma removed. A Bard polypropylene mesh 7.6 cm x 15 cm was selected and cut down to the size of the inguinal canal. This was secured at the pubic tubercle with 2 interrupted 0-Prolene and similarly tacked to the conjoint tendon. A running suture was used to secure the mesh to the  shelving edge of the inguinal ligament.  Tails were cut into the mesh and  the internal ring recreated, with care not allow space for the cord structures. The wound was inspected and good hemostasis achieved. Local was infiltrated at the pubic tubercle. The External oblique fascia was closed with running 2-0 Vicryl and local infiltrated in the fascia. The wound was irrigated and clean. The wound was closed in layers, skin closed with 4-0 monocryl subcuticularly.. Dermabond skin glue was used for the dressing.  The testicles were checked and in place at the completion.      The patient tolerated the procedure well without complications. Blood loss was minimal. Patient was extubated and transferred to the pacu for recovery in stable condition. Family was updated at the end of the case per request.     All counts for sharps, sponge and instruments were correct.              Estimated Blood Loss:   * No values recorded between 04/29/2021 11:42 AM and 04/29/2021  1:34 PM *  < 5 cc    Implants:     Implant Name Type Inv. Item Serial No. Manufacturer Lot No. LRB No. Used Action   MESH SRG PP MFL RECT BARD MRLX [id number] LF - [id number] Mesh MESH SRG PP MFL RECT BARD MRLX 6X3IN LF  BARD DAVOL [id number] Right 1 Implanted          Drains:   Drains: no      Specimens:   *  No specimens in log *  None    Complications:   None    Signed by: Towanda Octave, MD  [facility]

## 2021-04-29 NOTE — Anesthesia Procedure Notes (Signed)
Intubation - In OR      Procedure Details  Atraumatic insertion:  Yes  Preoxygenation:  Yes  Induction:  IV  Mask ventilation:  Easy  Airway adjunct:  Oral    Attempts  Number of attempts:  1    Successful Attempt  Intubation method:  Mac  Cricoid pressure: Yes    Cords visualized: Yes    ETT View:  Full  Blade size:  3  Site:  Oral  Tube type:  Cuffed  Stylet: Yes      Verification  Type:  Regular  Cuff inflated: Yes    Tube secured with:  Adhesive tape  Breath sounds:  Equal  Placement verification:  Auscultation and Capnometry

## 2021-04-29 NOTE — Progress Notes (Signed)
Transfer criteria met. On transfer patient easily arousable. Respirations even and unlabored. IV patent. Vital signs stable. Patient reports pain level as tolerable. Surgical site clean, dry and intact. Report called to receiving nurse, Hiwot  and given in [facility] format. Patient transported via Bed with all belongings.   Patient's family updated on patient location.

## 2021-04-29 NOTE — Plan of Care (Signed)
Problem: Moderate/High Fall Risk Score >5  Goal: Patient will remain free of falls  Outcome: Progressing  Flowsheets (Taken 04/29/2021 2002)  High (Greater than 13):   HIGH-Visual cue at entrance to patient's room   HIGH-Bed alarm on at all times while patient in bed   HIGH-Utilize chair pad alarm for patient while in the chair   HIGH-Apply yellow "Fall Risk" arm band   HIGH-Initiate use of floor mats as appropriate   HIGH-Consider use of low bed     Problem: Pain interferes with ability to perform ADL  Goal: Pain at adequate level as identified by patient  Outcome: Progressing  Flowsheets (Taken 04/28/2021 2335)  Pain at adequate level as identified by patient:   Identify patient comfort function goal   Assess for risk of opioid induced respiratory depression, including snoring/sleep apnea. Alert healthcare team of risk factors identified.   Assess pain on admission, during daily assessment and/or before any "as needed" intervention(s)   Reassess pain within 30-60 minutes of any procedure/intervention, per Pain Assessment, Intervention, Reassessment (AIR) Cycle   Evaluate if patient comfort function goal is met   Evaluate patient's satisfaction with pain management progress   Include patient/patient care companion in decisions related to pain management as needed     Problem: Safety  Goal: Patient will be free from injury during hospitalization  Outcome: Progressing  Flowsheets (Taken 04/28/2021 2335)  Patient will be free from injury during hospitalization:   Assess patient's risk for falls and implement fall prevention plan of care per policy   Provide and maintain safe environment   Ensure appropriate safety devices are available at the bedside   Include patient/ family/ care giver in decisions related to safety   Hourly rounding   Assess for patients risk for elopement and implement Elopement Risk Plan per policy  Goal: Patient will be free from infection during hospitalization  Outcome: Progressing  Flowsheets  (Taken 04/28/2021 2335)  Free from Infection during hospitalization:   Assess and monitor for signs and symptoms of infection   Monitor lab/diagnostic results   Monitor all insertion sites (i.e. indwelling lines, tubes, urinary catheters, and drains)   Encourage patient and family to use good hand hygiene technique

## 2021-04-29 NOTE — Progress Notes (Signed)
[facility]  PROGRESS NOTE      Patient: Rick [name]  Date: 04/29/2021   LOS: 2 Days  Admission Date: 04/27/2021   MRN: [id number]  Attending: Vella Raring, MD  Please contact me on the following Spectralink [phone]           SUBJECTIVE     Fever No    Eating No    Cough No    Diarrhea No    Sleep Yes  Voiding Yes    MEDICATIONS     Current Facility-Administered Medications   Medication Dose Route Frequency    [Held by provider] enoxaparin  40 mg Subcutaneous Q24H SCH    [MAR Hold] sacubitril-valsartan  0.5 tablet Oral Q12H SCH       PHYSICAL EXAM     Vitals:    04/29/21 1619   BP: 136/60   Pulse: (!) 43   Resp: 16   Temp: 97.3 F (36.3 C)   SpO2: 99%       Temperature: Temp  Min: 97.1 F (36.2 C)  Max: 98.4 F (36.9 C)  Pulse: Pulse  Min: 43  Max: 80  Respiratory: Resp  Min: 12  Max: 20  Non-Invasive BP: BP  Min: 128/67  Max: 158/72  Pulse Oximetry SpO2  Min: 96 %  Max: 100 %          Intake and Output Summary (Last 24 hours) at Date Time    Intake/Output Summary (Last 24 hours) at 04/29/2021 1917  Last data filed at 04/29/2021 1748  Gross per 24 hour   Intake 1790 ml   Output 550 ml   Net 1240 ml       GEN APPEARANCE: Patient better hydrated  HEENT: PERLA; Icterus No    NECK: Supple; No bruits  CVS: RRR, S1, S2; systolic murmur with valvular click  LUNGS: Air Entry Equal; No Wheezes; No Rhonchi: No rales  ABD: Soft; Bowl Sounds; Yes  GU: Foley No  R inguinal hernia has been reduced  EXT: No edema; Pulses 2+ and intact  Skin exam: Cold No    NEURO: CN 2-12 intact; No Focal neurological deficits: No Flaps        Exam done by Vella Raring, MD on 04/29/21 at 7:17 PM      LABS     Recent Labs   Lab 04/29/21  0351 04/28/21  0406 04/27/21  1814   WBC 3.95 5.42 4.22   RBC 3.63* 3.50* 3.73*   Hgb 12.3* 12.0* 12.8   Hematocrit 37.9 36.8* 38.9   MCV 104.4* 105.1* 104.3*   Platelets 80* 85* 83*       Recent Labs   Lab 04/29/21  0351 04/28/21  0406 04/27/21  1814   Sodium 145 145 141    Potassium 4.6 4.6 4.6   Chloride 112* 108 105   CO2 25 29 25    BUN 26.0 37.0* 40.0*   Creatinine 1.3 1.4 1.4   Glucose 122* 108* 143*   Calcium 9.4 9.9 10.0       Recent Labs   Lab 04/29/21  0351 04/27/21  1814   ALT  --  16   AST (SGOT)  --  26   Bilirubin, Total  --  0.7   Albumin 3.1* 3.9   Alkaline Phosphatase  --  69       Recent Labs   Lab 04/27/21  1814   hs Troponin-I 17.6  Microbiology Results (last 15 days)       Procedure Component Value Units Date/Time    COVID-19 (SARS-CoV-2) only (L[facility] Rapid) asymptomatic admission [[id number]] Collected: 04/27/21 2146    Order Status: Completed Specimen: Nasopharyngeal Updated: 04/27/21 2213     Purpose of COVID testing Screening     SARS-CoV-2 Specimen Source Nasal Swab     SARS CoV 2 Overall Result Not Detected     Comment: __________________________________________________  -A result of "Detected" indicates POSITIVE for the    presence of SARS CoV-2 RNA  -A result of "Not Detected" indicates NEGATIVE for the    presence of SARS CoV-2 RNA  __________________________________________________________  Test performed using the Roche cobas Liat SARS-CoV-2 assay. This assay is  only for use under the Food and Drug Administration's Emergency Use  Authorization. This is a real-time RT-PCR assay for the qualitative  detection of SARS-CoV-2 RNA. Viral nucleic acids may persist in vivo,  independent of viability. Detection of viral nucleic acid does not imply the  presence of infectious virus, or that virus nucleic acid is the cause of  clinical symptoms. Negative results do not preclude SARS-CoV-2 infection and  should not be used as the sole basis for diagnosis, treatment or other  patient management decisions. Negative results must be combined with  clinical observations, patient history, and/or epidemiological information.  Invalid results may be due to inhibiting substances in the specimen and  recollection should occur. Please see Fact Sheets for patients and  providers  located:  WirelessDSLBlog.no         Narrative:      o Collect and clearly label specimen type:  o PREFERRED-Upper respiratory specimen: One Nasal Swab in  Transport Media.  o Hand deliver to laboratory ASAP  Indication for testing->Extended care facility admission to  semi private room  Screening    COVID-19 (SARS-CoV-2) only ([facility]) asymptomatic admission [[id number]] Collected: 04/27/21 2146    Order Status: Sent Specimen: Nasopharyngeal Updated: 04/27/21 2146    Narrative:      o Collect and clearly label specimen type:  o PREFERRED-Upper respiratory specimen: One Nasal Swab in  Transport Media.  o Hand deliver to laboratory ASAP  Indication for testing->Extended care facility admission to  semi private room  Screening             RADIOLOGY     No results found.     ASSESSMENT/PLAN     Rick [name] is a 88 y.o. male admitted with Incarcerated inguinal hernia    Interval Summary:     Patient Active Hospital Problem List:    Right inguinal hernia  Small bowel obstruction  -Presented with right lower quadrant abdominal pain that started earlier today  -Hemodynamically stable.  Abdomen is soft and nontender  -CT A/P showed small bowel obstruction with transition point at site of new right-sided inguinal hernia.   -Evaluated by Dr. Allena Katz, surgery and hernia reduced  -Plan: S/P herniorrhaphy after  cardiac risk stratification.     Recent acute pulmonary embolism with cor pulmonale    AS s/p TAVR 2021, hypertension, HLD who was recently admitted with acute PE and discharged home on Eliquis, now presented to the hospital on 04/27/2021 with right inguinal hernia with severe pain.  Plan: restart Eliquis when OK with surgery.     CAD status post CABG  Cardiomyopathy  Combined systolic and diastolic chronic congestive heart failure  Nonrheumatic aortic valve stenosis (12/23/2018)     S/P TAVR (transcatheter aortic valve  replacement)  -LVEF 25 to 30%  -Monitor I&O and  daily weight  Previously could not tolerate Aldactone and Jardiance due to hypotension  Plan: Patient is n.p.o. and looks dehydrated . hold Lasix for now.      Hypertension  -Resumed Entresto in a.m. if vitals are stable     Vascular dementia (04/28/2021)  -On Namenda     Nutrition  N.p.o.     DVT/VTE Prophylaxis  Lovenox held, Resume Eliquis after surgery        Signed,  Hyman Crossan Kathreen Cosier, MD  7:17 PM 04/29/2021

## 2021-04-29 NOTE — Nursing Progress Note (Addendum)
Introduced myself and role to patient. Alert and oriented x 3. Patient appears confused. Removed his telemetry stickers few times. No pain reported. Medications were crushed and given with apple sauce. CHG bath done for a surgical procedure tomorrow (Open Right Herniorrhaphy, Inguinal). Patient is informed of his Nothing through the mouth after midnight. Safety and fall bundle remain in place. Purposeful rounding completed. Patient looks comfortable and stable at the time of this documentation. Will continue to monitor patient.     On long term continuous telemetry monitoring. Received a call from the telemetry room that patient was having V-tach with six PVCs.

## 2021-04-29 NOTE — Transfer of Care (Signed)
Anesthesia Transfer of Care Note    Patient: Rick [name]    Procedures performed: Procedure(s):  OPEN RIGHT HERNIORRHAPHY, INGUINAL WITH MESH    Anesthesia type: General ETT    Patient location:Phase I PACU    Last vitals:   Vitals:    04/29/21 1338   BP: 128/67   Pulse: 70   Resp: 14   Temp: 36.2 C (97.1 F)   SpO2: 100%       Post pain: Patient not complaining of pain, continue current therapy      Mental Status:awake and alert     Respiratory Function: tolerating face mask    Cardiovascular: stable    Nausea/Vomiting: patient not complaining of nausea or vomiting    Hydration Status: adequate    Post assessment: no apparent anesthetic complications    Signed by: Carlos Levering, CRNA  04/29/21 1:39 PM

## 2021-04-29 NOTE — OR PreOp (Signed)
Son states he was not given code required to access mobile consent forms, therefore he is on his way to this hospital at this time; will be here in 15 mins.

## 2021-04-29 NOTE — Anesthesia Postprocedure Evaluation (Signed)
Anesthesia Post Evaluation    Patient: Rick [name]    Procedure(s):  OPEN RIGHT HERNIORRHAPHY, INGUINAL WITH MESH    Anesthesia type: general    Last Vitals:   Vitals Value Taken Time   BP 146/67 04/29/21 1415   Temp 36.2 C (97.1 F) 04/29/21 1338   Pulse 60 04/29/21 1415   Resp 12 04/29/21 1415   SpO2 96 % 04/29/21 1415                 Anesthesia Post Evaluation:     Patient Evaluated: PACU    Level of Consciousness: sleepy but conscious    Pain Management: adequate    Airway Patency: patent    Anesthetic complications: No      PONV Status: none    Cardiovascular status: acceptable  Respiratory status: acceptable  Hydration status: acceptable        Signed by: Harriet Butte, MD, 04/29/2021 2:32 PM

## 2021-04-30 ENCOUNTER — Encounter: Payer: Self-pay | Admitting: Surgery

## 2021-04-30 LAB — ECG 12-LEAD
Atrial Rate: 59 {beats}/min
P Axis: 30 degrees
P-R Interval: 160 ms
Q-T Interval: 316 ms
QRS Duration: 100 ms
QTC Calculation (Bezet): 397 ms
R Axis: 4 degrees
T Axis: 175 degrees
Ventricular Rate: 95 {beats}/min

## 2021-04-30 LAB — RENAL FUNCTION PANEL
Albumin: 2.8 g/dL — ABNORMAL LOW (ref 3.5–5.0)
Anion Gap: 8 (ref 5.0–15.0)
BUN: 23 mg/dL (ref 9.0–28.0)
CO2: 23 mEq/L (ref 17–29)
Calcium: 9.1 mg/dL (ref 7.9–10.2)
Chloride: 110 mEq/L (ref 99–111)
Creatinine: 1.1 mg/dL (ref 0.5–1.5)
Glucose: 107 mg/dL — ABNORMAL HIGH (ref 70–100)
Phosphorus: 3.4 mg/dL (ref 2.3–4.7)
Potassium: 4.4 mEq/L (ref 3.5–5.3)
Sodium: 141 mEq/L (ref 135–145)

## 2021-04-30 LAB — CBC
Absolute NRBC: 0 10*3/uL (ref 0.00–0.00)
Absolute NRBC: 0 10*3/uL (ref 0.00–0.00)
Hematocrit: 35.3 % — ABNORMAL LOW (ref 37.6–49.6)
Hematocrit: 35.4 % — ABNORMAL LOW (ref 37.6–49.6)
Hgb: 11.8 g/dL — ABNORMAL LOW (ref 12.5–17.1)
Hgb: 11.8 g/dL — ABNORMAL LOW (ref 12.5–17.1)
MCH: 34.7 pg — ABNORMAL HIGH (ref 25.1–33.5)
MCH: 34.4 pg — ABNORMAL HIGH (ref 25.1–33.5)
MCHC: 33.4 g/dL (ref 31.5–35.8)
MCHC: 33.3 g/dL (ref 31.5–35.8)
MCV: 103.8 fL — ABNORMAL HIGH (ref 78.0–96.0)
MCV: 103.2 fL — ABNORMAL HIGH (ref 78.0–96.0)
MPV: 13 fL — ABNORMAL HIGH (ref 8.9–12.5)
MPV: 11.9 fL (ref 8.9–12.5)
Nucleated RBC: 0 /100 WBC (ref 0.0–0.0)
Nucleated RBC: 0 /100 WBC (ref 0.0–0.0)
Platelets: 74 10*3/uL — ABNORMAL LOW (ref 142–346)
Platelets: 81 10*3/uL — ABNORMAL LOW (ref 142–346)
RBC: 3.4 10*6/uL — ABNORMAL LOW (ref 4.20–5.90)
RBC: 3.43 10*6/uL — ABNORMAL LOW (ref 4.20–5.90)
RDW: 13 % (ref 11–15)
RDW: 13 % (ref 11–15)
WBC: 7.24 10*3/uL (ref 3.10–9.50)
WBC: 5.75 10*3/uL (ref 3.10–9.50)

## 2021-04-30 LAB — GFR
EGFR: >60
EGFR: >60

## 2021-04-30 LAB — BASIC METABOLIC PANEL
Anion Gap: 7 (ref 5.0–15.0)
BUN: 25 mg/dL (ref 9.0–28.0)
CO2: 23 mEq/L (ref 17–29)
Calcium: 9 mg/dL (ref 7.9–10.2)
Chloride: 112 mEq/L — ABNORMAL HIGH (ref 99–111)
Creatinine: 1.2 mg/dL (ref 0.5–1.5)
Glucose: 125 mg/dL — ABNORMAL HIGH (ref 70–100)
Potassium: 4.1 mEq/L (ref 3.5–5.3)
Sodium: 142 mEq/L (ref 135–145)

## 2021-04-30 MED ORDER — APIXABAN 2.5 MG PO TABS
2.5000 mg | ORAL_TABLET | Freq: Two times a day (BID) | ORAL | Status: DC
Start: 2021-04-30 — End: 2021-04-30

## 2021-04-30 MED ORDER — ROSUVASTATIN CALCIUM 5 MG PO TABS
5.0000 mg | ORAL_TABLET | Freq: Every evening | ORAL | Status: DC
Start: 2021-04-30 — End: 2021-05-02
  Administered 2021-04-30 – 2021-05-01 (×2): 5 mg via ORAL
  Filled 2021-04-30 (×2): qty 1

## 2021-04-30 MED ORDER — MEMANTINE HCL 5 MG PO TABS
20.0000 mg | ORAL_TABLET | Freq: Every evening | ORAL | Status: DC
Start: 2021-04-30 — End: 2021-05-02
  Administered 2021-04-30 – 2021-05-01 (×2): 20 mg via ORAL
  Filled 2021-04-30 (×2): qty 4

## 2021-04-30 MED ORDER — ACETAMINOPHEN 325 MG PO TABS
650.0000 mg | ORAL_TABLET | Freq: Four times a day (QID) | ORAL | Status: DC
Start: 2021-04-30 — End: 2021-05-02
  Administered 2021-04-30 – 2021-05-02 (×7): 650 mg via ORAL
  Filled 2021-04-30 (×7): qty 2

## 2021-04-30 MED ORDER — SENNOSIDES-DOCUSATE SODIUM 8.6-50 MG PO TABS
1.0000 | ORAL_TABLET | Freq: Every evening | ORAL | Status: DC
Start: 2021-04-30 — End: 2021-05-02
  Administered 2021-04-30 – 2021-05-01 (×2): 1 via ORAL
  Filled 2021-04-30 (×2): qty 1

## 2021-04-30 MED ORDER — APIXABAN 5 MG PO TABS
5.0000 mg | ORAL_TABLET | Freq: Two times a day (BID) | ORAL | Status: DC
Start: 2021-04-30 — End: 2021-05-02
  Administered 2021-04-30 – 2021-05-02 (×5): 5 mg via ORAL
  Filled 2021-04-30 (×5): qty 1

## 2021-04-30 NOTE — Progress Notes (Signed)
Office [phone]    ASSESSMENT:  49M with PMH of CAD s/p CABG, aortic valve,  TAVR, HFrEF, dementia, PE and known R inguinal hernia presented with incarcerated R inguinal hernia now s/p reduction bedside on Eliquis from prior PE (held for 48 hours), now POD#1 s/p open R inguinal herniorrhaphy.    PLAN:  - Advance diet as tolerated  - Multimodal pain regimen  - appreciate cardiology/medicine assistance  - Please continue to hold Eliquis until 2 days post op.   - bowel regimen  - PT/OT- have patient OOB as much as possible  - rest of care per primary team    Attending Note:    Patient doing well. Denies any abdominal pain, right groin pain, n/v. Passing flatus but no BM. On exam, abdomen soft, nt, nd, right groin incision site c/d/I.  - Ok to advance diet  - OK to restart Eliquis tomorrow  - Pain control prn  - Bowel Regimen  - OOB/Ambulate with PT/OT  - Rest as per Primary    I, Marlaine HindAvik Lexander Tremblay, MD, have personally seen and examined this patient and have participated in their care. I agree with all clinical information, including the physical exam, patient history, and planning as documented by the physician assistant/resident. All notes, labs, and imaging were reviewed.  The patient was seen on the date the note was written      Marlaine HindAvik Yadhira Mckneely, MD  [facility]  [phone]     SUBJECTIVE:  NAEON. Feeling well. Pain is well-controlled. Tolerated clears.     OBJECTIVE:  Vitals:    04/30/21 0344 04/30/21 0709 04/30/21 1144 04/30/21 1515   BP: 138/77 129/70 135/75 117/50   Pulse: (!) 54 (!) 54 (!) 54 (!) 40   Resp: 20 18 17 17    Temp: 98.1 F (36.7 C) 98.3 F (36.8 C) 97.9 F (36.6 C) 98.2 F (36.8 C)   TempSrc: Oral Oral Oral Oral   SpO2: 98% 98% 97% 97%   Weight:  64.2 kg (141 lb 9.6 oz)     Height:         General -- NAD, alert and cooperative, AOx3  HEENT -- trachea midline, sclerae anicteric  CV -- RRR  Pulm -- non labored breathing, RA  Abd -- soft, nondistended, nontender, no rebound or  guarding, R groin incision c/d/i  Extr -- warm, no edema  Neuro -- grossly intact, no focal deficits     LABS:  Lab Results   Component Value Date    WBC 7.24 04/30/2021    HGB 11.8 (L) 04/30/2021    HCT 35.3 (L) 04/30/2021    MCV 103.8 (H) 04/30/2021    PLT 74 (L) 04/30/2021     Recent Labs   Lab 04/30/21  0349   Sodium 141   Potassium 4.4   Chloride 110   CO2 23   BUN 23.0   Creatinine 1.1   EGFR >60.0   Glucose 107*   Calcium 9.1       Lab Results   Component Value Date    ALT 16 04/27/2021    AST 26 04/27/2021    ALKPHOS 69 04/27/2021    BILITOTAL 0.7 04/27/2021       RADIOLOGY:  CT Abd/Pelvis without Contrast    Result Date: 04/27/2021   1. Small bowel obstruction with transition point at site of new right-sided inguinal hernia. 2. Remainder as above. Ivin PootJennifer Park, MD 04/27/2021 7:32 PM    XR Chest 2 Views  Result Date: 04/08/2021   No acute pulmonary infiltrates are demonstrated. Merri Ray, MD  04/08/2021 2:36 PM    CT Angio Chest (PE study)    Result Date: 04/08/2021  1. Segmental and subsegmental pulmonary emboli in the right lower lobe. Mild straightening of the interventricular septum as well as reflux of contrast into the intrahepatic IVC, suggesting right heart strain. These results were discussed with Dr. Brett Canales REDDEN on 04/08/2021 at 5:57 PM on Epic secure messaging. Aldean Ast, MD  04/08/2021 5:58 PM    Chest AP Portable    Result Date: 04/27/2021   Feeding tube noted with distal tip suspected to be in a right lower lobe bronchus. Provided history that patient's ER clinician Dr. Lucianne Muss was already aware and removed the patient's feeding tube. Ivin Poot, MD 04/27/2021 8:12 PM    US Venous Low Extrem Duplx Dopp Comp Bilat    Result Date: 04/09/2021   NO EVIDENCE OF INTRALUMINAL THROMBUS OR OBSTRUCTION TO VENOUS FLOW IN RIGHT OR LEFT LOWER EXTREMITY. PULSATILE VENOUS FLOW MAY REFLECT RIGHT HEART DYSFUNCTION AND/OR FLUID OVERLOAD. Wyvonne Lenz, MD  04/09/2021 12:23 PM    XR Abdomen Portable    Result  Date: 04/28/2021  1. Nasogastric tube in abnormal position. This finding was discussed with the patient's nurse, Ol[place], on 04/28/2021 12:39 AM . 2. Findings of small bowel obstruction. Jorene Guest, MD 04/28/2021 12:39 AM

## 2021-04-30 NOTE — Nursing Progress Note (Signed)
Introduced myself and role to patient. Alert and oriented x 3. No pain reported. Medications were crushed and given with apple sauce.  Infection prevention maintained and PPE worn within close proximity.Safety and fall bundle remain in place. Purposeful rounding completed. Patient looks comfortable and stable at the time of this documentation. Will continue to monitor patient.

## 2021-04-30 NOTE — Progress Notes (Signed)
[facility]  PROGRESS NOTE      Patient: Rick [name]  Date: 04/30/2021   LOS: 3 Days  Admission Date: 04/27/2021   MRN: [id number]  Attending: Vella Raring, MD  Please contact me on the following Spectralink [phone]           SUBJECTIVE     Fever No    Eating Clear liquids overnight  Cough No    Diarrhea No    Sleep Yes  Voiding Yes    MEDICATIONS     Current Facility-Administered Medications   Medication Dose Route Frequency    apixaban  5 mg Oral Q12H SCH    memantine  20 mg Oral QPM    rosuvastatin  5 mg Oral QHS    sacubitril-valsartan  0.5 tablet Oral Q12H SCH       PHYSICAL EXAM     Vitals:    04/30/21 1144   BP: 135/75   Pulse: (!) 54   Resp: 17   Temp: 97.9 F (36.6 C)   SpO2: 97%       Temperature: Temp  Min: 97.3 F (36.3 C)  Max: 98.8 F (37.1 C)  Pulse: Pulse  Min: 41  Max: 61  Respiratory: Resp  Min: 12  Max: 20  Non-Invasive BP: BP  Min: 105/66  Max: 146/67  Pulse Oximetry SpO2  Min: 96 %  Max: 99 %      Intake and Output Summary (Last 24 hours) at Date Time    Intake/Output Summary (Last 24 hours) at 04/30/2021 1358  Last data filed at 04/29/2021 2351  Gross per 24 hour   Intake 240 ml   Output 400 ml   Net -160 ml       GEN APPEARANCE: Patient better hydrated  HEENT: PERLA; Icterus No    NECK: Supple; No bruits  CVS: RRR, S1, S2; systolic murmur with valvular click  LUNGS: Air Entry Equal; No Wheezes; No Rhonchi: No rales  ABD: Soft; Bowl Sounds; Yes  GU: Foley No  R inguinal hernia dressing, no hematoma  EXT: No edema; Pulses 2+ and intact  Skin exam: Cold No    NEURO: CN 2-12 intact; No Focal neurological deficits: No Flaps      Exam done by Vella Raring, MD on 04/30/21 at 1:58 PM      LABS     Recent Labs   Lab 04/30/21  0349 04/29/21  0351 04/28/21  0406   WBC 7.24 3.95 5.42   RBC 3.40* 3.63* 3.50*   Hgb 11.8* 12.3* 12.0*   Hematocrit 35.3* 37.9 36.8*   MCV 103.8* 104.4* 105.1*   Platelets 74* 80* 85*       Recent Labs   Lab 04/30/21  0349 04/29/21  0351  04/28/21  0406 04/27/21  1814   Sodium 141 145 145 141   Potassium 4.4 4.6 4.6 4.6   Chloride 110 112* 108 105   CO2 23 25 29 25    BUN 23.0 26.0 37.0* 40.0*   Creatinine 1.1 1.3 1.4 1.4   Glucose 107* 122* 108* 143*   Calcium 9.1 9.4 9.9 10.0       Recent Labs   Lab 04/30/21  0349 04/29/21  0351 04/27/21  1814   ALT  --   --  16   AST (SGOT)  --   --  26   Bilirubin, Total  --   --  0.7   Albumin 2.8* 3.1* 3.9   Alkaline  Phosphatase  --   --  69       Recent Labs   Lab 04/27/21  1814   hs Troponin-I 17.6             Microbiology Results (last 15 days)       Procedure Component Value Units Date/Time    COVID-19 (SARS-CoV-2) only ([facility]) asymptomatic admission [[id number]] Collected: 04/27/21 2146    Order Status: Completed Specimen: Nasopharyngeal Updated: 04/27/21 2213     Purpose of COVID testing Screening     SARS-CoV-2 Specimen Source Nasal Swab     SARS CoV 2 Overall Result Not Detected     Comment: __________________________________________________  -A result of "Detected" indicates POSITIVE for the    presence of SARS CoV-2 RNA  -A result of "Not Detected" indicates NEGATIVE for the    presence of SARS CoV-2 RNA  __________________________________________________________  Test performed using the Roche cobas [facility] SARS-CoV-2 assay. This assay is  only for use under the Food and Drug Administration's Emergency Use  Authorization. This is a real-time RT-PCR assay for the qualitative  detection of SARS-CoV-2 RNA. Viral nucleic acids may persist in vivo,  independent of viability. Detection of viral nucleic acid does not imply the  presence of infectious virus, or that virus nucleic acid is the cause of  clinical symptoms. Negative results do not preclude SARS-CoV-2 infection and  should not be used as the sole basis for diagnosis, treatment or other  patient management decisions. Negative results must be combined with  clinical observations, patient history, and/or epidemiological information.  Invalid results  may be due to inhibiting substances in the specimen and  recollection should occur. Please see Fact Sheets for patients and providers  located:  WirelessDSLBlog.no         Narrative:      o Collect and clearly label specimen type:  o PREFERRED-Upper respiratory specimen: One Nasal Swab in  Transport Media.  o Hand deliver to laboratory ASAP  Indication for testing->Extended care facility admission to  semi private room  Screening    COVID-19 (SARS-CoV-2) only ([facility]) asymptomatic admission [[id number]] Collected: 04/27/21 2146    Order Status: Sent Specimen: Nasopharyngeal Updated: 04/27/21 2146    Narrative:      o Collect and clearly label specimen type:  o PREFERRED-Upper respiratory specimen: One Nasal Swab in  Transport Media.  o Hand deliver to laboratory ASAP  Indication for testing->Extended care facility admission to  semi private room  Screening             RADIOLOGY     No results found.     ASSESSMENT/PLAN     Rick [name] is a 88 y.o. male admitted with Incarcerated inguinal hernia    Interval Summary:     Patient Active Hospital Problem List:     Right inguinal hernia  Small bowel obstruction  -Presented with right lower quadrant abdominal pain that started earlier today  -Hemodynamically stable.  Abdomen is soft and nontender  -CT A/P showed small bowel obstruction with transition point at site of new right-sided inguinal hernia.   -Evaluated by Dr. Allena Katz, surgery and hernia reduced  -Plan: S/P herniorrhaphy after  cardiac risk stratification. Restart Eliquis, advance diet.  Anticipate [place] home in AM id OK with surgery     Recent acute pulmonary embolism with cor pulmonale    AS s/p TAVR 2021, hypertension, HLD who was recently admitted with acute PE and discharged home on Eliquis, now presented  to the hospital on 04/27/2021 with right inguinal hernia with severe pain.  Plan: restart Eliquis, recheck Hb in AM     CAD status post CABG  Cardiomyopathy  Combined  systolic and diastolic chronic congestive heart failure  Nonrheumatic aortic valve stenosis (12/23/2018)     S/P TAVR (transcatheter aortic valve replacement)  -LVEF 25 to 30%  -Monitor I&O and daily weight  Previously could not tolerate Aldactone and Jardiance due to hypotension  Plan: Looks comfortable supine. holding Lasix for now.      Hypertension  -Resumed Entresto in a.m. if vitals are stable     Vascular dementia (04/28/2021)  -On Namenda     Nutrition  Heart healthy diet     DVT/VTE Prophylaxis   Resume Eliquis          Signed,  Antonisha Waskey Kathreen CosierShankar Kalynn Declercq, MD  1:58 PM 04/30/2021

## 2021-04-30 NOTE — Plan of Care (Signed)
Problem: Compromised Tissue integrity  Goal: Damaged tissue is healing and protected  Flowsheets (Taken 04/30/2021 1516)  Damaged tissue is healing and protected:   Avoid shearing injuries   Reposition patient every 2 hours and as needed unless able to reposition self   Monitor/assess Braden scale every shift   Monitor patient's hygiene practices     Problem: Safety  Goal: Patient will be free from injury during hospitalization  Flowsheets (Taken 04/30/2021 1516)  Patient will be free from injury during hospitalization:   Hourly rounding   Ensure appropriate safety devices are available at the bedside   Provide and maintain safe environment     NURSING SHIFT NOTE     Patient: Rick [name]  Day: 3      SHIFT EVENTS     Shift Narrative/Significant Events (PRN med administration, fall, RRT, etc.):   Pt is alert and oriented*3, disoriented to time, on contact isolation for MRSA, has a video camera in the room, on cardiac diet, on Tele showing AF, denied pain dressing at the groin side is clean and intact, pt worked with PT and PT recommended home with supervision. Schedule med was given, pt denied pain, vital signs are stable.    Safety and fall precautions remain in place. Purposeful rounding completed.          ASSESSMENT     Changes in assessment from patient's baseline this shift:    Neuro: No  CV: No  Pulm: No  Peripheral Vascular: No  HEENT: No  GI: No  BM during shift: No, Last BM:    GU: No   Integ: No  MS: No    Pain: None  Pain Interventions: None  Medications Utilized: no    Mobility: PMP Activity: (P) Step 5 - Chair of Distance Walked (ft) (Step 6,7): (P) 4 Feet           Lines     Patient Lines/Drains/Airways Status       Active Lines, Drains and Airways       Name Placement date Placement time Site Days    Peripheral IV 04/27/21 20 G Right Antecubital 04/27/21  1805  Antecubital  2                         VITAL SIGNS     Vitals:    04/30/21 1144   BP: 135/75   Pulse: (!) 54   Resp: 17   Temp:  97.9 F (36.6 C)   SpO2: 97%       Temp  Min: 97.3 F (36.3 C)  Max: 98.8 F (37.1 C)  Pulse  Min: 41  Max: 61  Resp  Min: 16  Max: 20  BP  Min: 105/66  Max: 138/77  SpO2  Min: 96 %  Max: 99 %      Intake/Output Summary (Last 24 hours) at 04/30/2021 1520  Last data filed at 04/29/2021 2351  Gross per 24 hour   Intake 240 ml   Output 400 ml   Net -160 ml            Remove Oxygen Weaning section if not applicable.  OXYGEN WEANING     Stable to wean?: No    Attempted to wean?: No       Symptoms:     Oxygen saturation at rest maintained at:  97% at room air          Oxygen saturation with ambulation  maintained at:  98% at room air          Patient self-proning?: No:      Patient using incentive spirometer?: No:    Amount achieved on incentive spirometer?: No data recorded      CARE PLAN

## 2021-04-30 NOTE — PT Eval Note (Signed)
Physical Therapy Eval and Treatment  Rick [name]      Post Acute Care Therapy Recommendations   Discharge Recommendations:  (P) Home with supervision, Home with home health PT, Home with home health OT (assistance w/mobility and ADL's, Family able to provide supervision and assistance)    If recommended discharge disposition is not available, patient will require the following assistance: SNF    DME needs IF patient is discharging home: (P) Medical alert system    Therapy discharge recommendations may change with patient status.  Please refer to most recent note for up-to-date recommendations.    Is an Occupational Therapy Evaluation Indicated at this time? Yes, an acute care OT evaluation is indicated at this time and should be performed prior to hospital discharge to assist with ADL DME recommendations and/or D/C placement recommendations.    [facility]: [facility]  Bed: Z6109/U0454.B    ___________________________________________________    Time of Evaluation and Treatment:  Time Calculation  PT Received On: (P) 04/30/21  Start Time: (P) 1425  Stop Time: (P) 1457  Time Calculation (min): (P) 32 min    Evaluation Time: 8 minutes  Treatment Time: 24 minutes    Chart Review and Collaboration with Care Team: 8 minutes, not included in above time    PT Visit Number: (P) 1    Consult received for Rick [name] for PT Evaluation and Treatment.  Patient's medical condition is appropriate for Physical therapy intervention at this time.    Activity Orders:  PT eval and treat    Precautions and Contraindications:  Precautions  Weight Bearing Status: (P) no restrictions  Fall Risks: (P) High, Impaired balance/gait, Impaired mobility, Muscle weakness, Mental status change  Other Precautions: (P) Contact Precautions, Bleeding Precautions    Personal Protective Equipment (PPE)  gloves, procedure gown, procedure mask, and pt wore procedure mask    Medical Diagnosis:  Anticoagulated  [Z79.01]  Incarcerated inguinal hernia [K40.30]    History of Present Illness:  Rick [name] is a 88 y.o. male admitted on 04/27/2021 with "severe, constant, aching bulge in the right groin area that patient noticed earlier on 04/27/2021.  No nausea or vomiting.  He had breakfast on 04/27/2021 but has not eaten anything since then.  Symptoms are exacerbated by palpation of the bulge.  No alleviating factors.   No history of abdominal surgery.   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    Dry cough    Aortic valve stenosis, etiology of cardiac valve disease unspecified    S/P TAVR (transcatheter aortic valve replacement)    Difficulty in walking, not elsewhere classified    Dementia without behavioral disturbance, unspecified dementia type    Balance disorder    Chronic systolic CHF (congestive heart failure)    Acute pulmonary embolism with acute cor pulmonale, unspecified pulmonary embolism type    Pulmonary embolism with acute cor pulmonale, unspecified chronicity, unspecified pulmonary embolism type    Incarcerated inguinal hernia    Acute renal failure with tubular necrosis    Vascular dementia    Thrombocytopenia       Past Medical/Surgical History:  Past Medical History:   Diagnosis Date    Atrial enlargement, bilateral Echo result 04/04/19    CAD (coronary artery disease) 05/28/19 Cath results.    Severe 3V CAD.  Patent LIMA to LAD, patent SVG to OM 1 and OM 2 of circumflex CA, & patent SVG to RCA.    Chronic combined systolic and diastolic heart failure 310/21 OV Dr. Penny Pia    EF=30%    Dementia 04/2014    Difficulty walking 06/2016    Post surgery of lt knee, rt femur     Dyslipidemia 310/21 OV Dr. Penny Pia    Dyspnea on exertion 02/2018    HTN (hypertension) 310/21 OV Dr. Penny Pia    Mild cognitive impairment 310/21 OV Dr. Penny Pia    Primary cardiomyopathy 11/2017    RBBB 07/10/19 EKg result    S/P CABG (coronary artery bypass graft)      LIMA to LAD,  SVG to OM 1 & OM 2 of circumflex CA, & SVG to RCA.     Past Surgical History:   Procedure Laterality Date    CORONARY ARTERY BYPASS GRAFT  2003     approx year: LIMA to LAD,  SVG to OM 1 & OM 2 of circumflex CA, & SVG to RCA    FRACTURE SURGERY Right 06/2016    femur    HERNIORRHAPHY, INGUINAL Right 04/29/2021    Procedure: OPEN RIGHT HERNIORRHAPHY, INGUINAL WITH MESH;  Surgeon: Towanda Octave, MD;  Location: [facility];  Service: General;  Laterality: Right;    PERCUTANEOUS VALVULOPLASTY - AORTIC N/A 07/12/2019    Procedure: TAVR #2;  Surgeon: Arlyss Queen, MD PhD;  Location: [facility];  Service: Cardiovascular;  Laterality: N/A;  23 HR OB    REPLACEMENT, AORTIC VALVE , EDWARDS SAPIEN N/A 07/12/2019    Procedure: REPLACEMENT, AORTIC VALVE , EDWARDS SAPIEN -- TAVR Valve:   S3 29mm -- Access: RTF -- IC: Dr. Penny Pia;  Surgeon: Christene Lye, MD;  [facility];  Service: Cardiothoracic;  Laterality: N/A;    RIGHT & LEFT HEART CATH POSSIBLE PCI N/A 05/26/2019    Procedure: RIGHT & LEFT HEART CATH POSSIBLE PCI;  Surgeon: Arlyss Queen, MD PhD;  Location: [facility];  Service: Cardiovascular;  Laterality: N/A;  [id number] scheduled with Maira in dr's office. OP. kr    TAVR PERCUTANEOUS FEMORAL  07/13/2019         TOTAL KNEE ARTHROPLASTY Left 2017    TOTAL KNEE ARTHROPLASTY Right 2005       X-Rays/Tests/Labs:  Lab Results   Component Value Date/Time    HGB 11.8 (L) 04/30/2021 03:49 AM    HCT 35.3 (L) 04/30/2021 03:49 AM    K 4.4 04/30/2021 03:49 AM    NA 141 04/30/2021 03:49 AM    INR 1.2 (H) 07/10/2019 02:22 PM    TROPI 17.6 04/27/2021 06:14 PM    TROPI 26.2 04/09/2021 01:10 AM    TROPI 33.2 04/08/2021  02:33 PM       All imaging reviewed, please see chart for details.    Social History:  Prior Level of Function  Prior level of function: (P) Needs assistance with ADLs, Ambulates with assistive device  Assistive Device: (P) Single point cane, Four wheel walker (Rollator)  Baseline Activity Level: (P) Community ambulation  Driving: (P) does not drive  Cooking: (P) No  Employment: (P) Retired  DME Currently at Home: (P) ADL- Grab Bars, Cane, Single [place], ADL- [employer], [place],  Four Wheel Aeronautical engineer)    Home Living Arrangements  Living Arrangements: (P) Family members  Type of Home: (P) House  Home Layout: (P) Multi-level, Elevator (No STE)  Bathroom Shower/Tub: (P) Tub/shower unit  Bathroom Toilet: (P) Standard  Bathroom Equipment: (P) Grab bars in shower, Shower chair  DME Currently at Home: (P) ADL- Grab Bars, Cane, [employer], ADL- [employer], Environmental consultant, Four Wheel (Rollator)      Subjective:  Patient is agreeable to participation in the therapy session. Family and/or guardian are agreeable to patient's participation in the therapy session. Nursing clears patient for therapy.  Patient Goal: (P) to get better  Pain Assessment  Pain Assessment: (P) No/denies pain      Objective:  Observation of Patient/Vital Signs:  Blood pressure 119/70, pulse (!) 58, temperature 97.9 F (36.6 C), temperature source Oral, resp. rate 17, height 1.727 m (5\' 8" ), weight 64.2 kg (141 lb 9.6 oz), SpO2 97 %.     Inspection/Posture: (P) Severe forward head posture    Cognitive Status and Neuro Exam:  Cognition/Neuro Status  Arousal/Alertness: (P) Appropriate responses to stimuli  Attention Span: (P) Appears intact  Orientation Level: (P) Oriented to person;Oriented to situation;Disoriented to place;Disoriented to time  Memory: (P) Decreased recall of biographical information  Following Commands: (P) Follows one step commands without difficulty  Safety Awareness: (P) minimal verbal instruction  Insights: (P) Fully aware of  deficits  Problem Solving: (P) Assistance required to identify errors made  Behavior: (P) calm;attentive;cooperative  Motor Planning: (P) decreased processing speed;decreased initiation  Coordination: (P) FMC impaired;GMC impaired    Musculoskeletal Examination  Gross ROM  Right Lower Extremity ROM: (P) within functional limits  Left Lower Extremity ROM: (P) within functional limits  Gross Strength  Right Lower Extremity Strength: (P) 3/5  Left Lower Extremity Strength: (P) 3/5       Functional Mobility:  Functional Mobility  Rolling: (P) Contact Guard Assist;to Left (Instructions for hand placement)  Supine to Sit: (P) Contact Guard Assist;Increased Time;Increased Effort;to Left  Scooting to EOB: (P) Contact Guard Assist;to Left;Increased Effort;Increased Time  Sit to Stand: (P) Moderate Assist;Increased Time;Increased Effort;with instruction for hand placement to increase safety (Trunk Support)     Locomotion  Ambulation: (P) Minimal Assist;with front-wheeled walker (77ft, trunk support)  Pattern: (P) shuffle;decreased step length;decreased cadence;Step to;Narrow BOS     Balance  Balance  Sitting - Static: (P) Good  Sitting - Dynamic: (P) Fair  Standing - Static: (P) Poor  Standing - Dynamic: (P) Poor    Participation and Activity Tolerance  Participation and Endurance  Participation Effort: (P) excellent  Endurance: (P) Tolerates 10 - 20 min exercise with multiple rests      Assessment:  Rick [name] is a 88 y.o. male admitted 04/27/2021.  PT Assessment  Assessment: (P) Decreased LE ROM;Decreased LE strength;Decreased safety/judgement during functional mobility;Decreased cognition;Decreased endurance/activity tolerance;Impaired coordination;Impaired motor control;Decreased functional mobility;Decreased balance;Gait impairment  Prognosis: (P) Good;With continued PT status post acute discharge  Progress: (P) Progressing toward goals      Treatment:  Functional Mobility:  Rolling: Contact Guard Assist and  to Right, Sit to Supine: Contact Guard Assist, Increased Time, Increased Effort, and to Right, and Stand to Sit: Minimal Assist, Increased Time, and Increased Effort. Provided pt with trunk support during stand to sit to facilitate safe transfer to bed.     Balance:  Sitting Balance: sitting reaching activities, with instruction, and with support and Standing Balance: patient education, with instruction, with support, and minimal  assist. Provided verbal cues to straighten posture and look up to eliminate excessive positions outside center of gravity.     Locomotion:  Ambulation: Minimal Assist, front-wheeled walker, and Performed 2 additional feet and Pattern: shuffle, decreased cadence, decreased step length, step to, and narrow BOS. Provided trunk support during the few steps to reduce fall risk.     Education/Instruction/Safety:  Pt required increased time/effort and instruction for improved safety and technique t/o session.   All OOB activities and ambulation performed with use of gait belt for increased pt and therapist safety.    Discussed therapy plan of care while in the hospital and discharge recommendation/DME with pt verbalized agreement.   Encouraged continued functional mobility as tolerated with nursing staff assistance; pt verbalized understanding and agreement.  Educated the Patient and Family to role of physical therapy, plan of care, goals of therapy and safety with mobility and ADLs, discharge instructions, home safety with verbalized understanding .    Patient left in bed with all medical equipment in place and call bell and all personal items/needs within reach (of note, pt received without alarm in place).  RN notified of session outcome.        Plan:  Treatment/Interventions: (P) Exercise, Gait training, Neuromuscular re-education, LE strengthening/ROM, Functional transfer training, Endurance training, Cognitive reorientation, Patient/family training, Equipment eval/education, Bed mobility  PT  Frequency: (P) 1-2x/wk  Risks/Benefits/POC Discussed with Pt/Family: (P) With patient/family    PMP Activity: (P) Step 5 - Chair  Distance Walked (ft) (Step 6,7): (P) 4 Feet      Goals:  Goals  Goal Formulation: (P) With patient/family  Time for Goal Acheivement: (P) By time of discharge  Goals: (P) Select goal  Pt Will Roll Left: (P) with stand by assist, to maximize functional mobility and independence, by time of discharge, Partly met (CGA. 3/15)  Pt Will Perform Sit to Stand: (P) with minimal assist, to maximize functional mobility and independence, by time of discharge, Partly met (ModA, 3/15)  Pt Will Ambulate: (P) 11-30 feet, with rolling walker, with contact guard assist, to maximize functional mobility and independence, by time of discharge, Partly met (MinA, 3/15)    Today's session was completed under the direct supervision of PT. PT was physically present and immediately available for the entire session and is fully responsible for the therapy tasks and activities being performed. PT is actively involved in the clinical assessment of the patient. Documentation and plan of care has been reviewed and approved by the PT    Linus Mako, SPT  04/30/2021  3:26 PM

## 2021-05-01 LAB — RENAL FUNCTION PANEL
Albumin: 2.6 g/dL — ABNORMAL LOW (ref 3.5–5.0)
Anion Gap: 6 (ref 5.0–15.0)
BUN: 32 mg/dL — ABNORMAL HIGH (ref 9.0–28.0)
CO2: 23 mEq/L (ref 17–29)
Calcium: 8.7 mg/dL (ref 7.9–10.2)
Chloride: 111 mEq/L (ref 99–111)
Creatinine: 1.4 mg/dL (ref 0.5–1.5)
Glucose: 97 mg/dL (ref 70–100)
Phosphorus: 3.2 mg/dL (ref 2.3–4.7)
Potassium: 4.1 mEq/L (ref 3.5–5.3)
Sodium: 140 mEq/L (ref 135–145)

## 2021-05-01 LAB — CBC
Absolute NRBC: 0 10*3/uL (ref 0.00–0.00)
Hematocrit: 32.4 % — ABNORMAL LOW (ref 37.6–49.6)
Hgb: 10.8 g/dL — ABNORMAL LOW (ref 12.5–17.1)
MCH: 34.5 pg — ABNORMAL HIGH (ref 25.1–33.5)
MCHC: 33.3 g/dL (ref 31.5–35.8)
MCV: 103.5 fL — ABNORMAL HIGH (ref 78.0–96.0)
MPV: 12.2 fL (ref 8.9–12.5)
Nucleated RBC: 0 /100 WBC (ref 0.0–0.0)
Platelets: 74 10*3/uL — ABNORMAL LOW (ref 142–346)
RBC: 3.13 10*6/uL — ABNORMAL LOW (ref 4.20–5.90)
RDW: 13 % (ref 11–15)
WBC: 4.45 10*3/uL (ref 3.10–9.50)

## 2021-05-01 LAB — GFR: EGFR: 57

## 2021-05-01 NOTE — Progress Notes (Signed)
Office [phone]    ASSESSMENT:  13M with PMH of CAD s/p CABG, aortic valve,  TAVR, HFrEF, dementia, PE and known R inguinal hernia presented with incarcerated R inguinal hernia now s/p reduction bedside on Eliquis from prior PE (held for 48 hours), now POD#2 s/p open R inguinal herniorrhaphy.    PLAN:  - Advance diet as tolerated  - Multimodal pain regimen  - appreciate cardiology/medicine assistance  - Restart Eliquis today  - bowel regimen  - PT/OT- have patient OOB as much as possible  - rest of care per primary team    SUBJECTIVE:  NAEON. Feeling well. Denies any pain. Tolerated diet yesterday. No nausea/vomiting. +flatus.    OBJECTIVE:  Vitals:    04/30/21 1515 04/30/21 1902 04/30/21 2347 05/01/21 0427   BP: 117/50 117/50 103/48 127/72   Pulse: (!) 40 (!) 40 (!) 51 (!) 56   Resp: 17  17 17    Temp: 98.2 F (36.8 C) 98.2 F (36.8 C) 97.7 F (36.5 C) 97.9 F (36.6 C)   TempSrc: Oral Oral Oral Oral   SpO2: 97% 98% 96% 98%   Weight:       Height:         General -- NAD, alert and cooperative, AOx3  HEENT -- trachea midline, sclerae anicteric  CV -- RRR  Pulm -- non labored breathing, RA  Abd -- soft, nondistended, nontender, no rebound or guarding, R groin incision c/d/I with mild bruising  Extr -- warm, no edema  Neuro -- grossly intact, no focal deficits     LABS:  Lab Results   Component Value Date    WBC 4.45 05/01/2021    HGB 10.8 (L) 05/01/2021    HCT 32.4 (L) 05/01/2021    MCV 103.5 (H) 05/01/2021    PLT 74 (L) 05/01/2021     Recent Labs   Lab 05/01/21  0428   Sodium 140   Potassium 4.1   Chloride 111   CO2 23   BUN 32.0*   Creatinine 1.4   EGFR 57.0   Glucose 97   Calcium 8.7       Lab Results   Component Value Date    ALT 16 04/27/2021    AST 26 04/27/2021    ALKPHOS 69 04/27/2021    BILITOTAL 0.7 04/27/2021       RADIOLOGY:  XR Abdomen Portable    Result Date: 04/28/2021  1. Nasogastric tube in abnormal position. This finding was discussed with the patient's nurse, Oluchi, on 04/28/2021 12:39 AM  . 2. Findings of small bowel obstruction. Jorene Guest, MD 04/28/2021 12:39 AM    Chest AP Portable    Result Date: 04/27/2021   Feeding tube noted with distal tip suspected to be in a right lower lobe bronchus. Provided history that patient's ER clinician Dr. Lucianne Muss was already aware and removed the patient's feeding tube. Ivin Poot, MD 04/27/2021 8:12 PM    CT Abd/Pelvis without Contrast    Result Date: 04/27/2021   1. Small bowel obstruction with transition point at site of new right-sided inguinal hernia. 2. Remainder as above. Ivin Poot, MD 04/27/2021 7:32 PM    US Venous Low Extrem Duplx Dopp Comp Bilat    Result Date: 04/09/2021   NO EVIDENCE OF INTRALUMINAL THROMBUS OR OBSTRUCTION TO VENOUS FLOW IN RIGHT OR LEFT LOWER EXTREMITY. PULSATILE VENOUS FLOW MAY REFLECT RIGHT HEART DYSFUNCTION AND/OR FLUID OVERLOAD. Wyvonne Lenz, MD  04/09/2021 12:23 PM    CT Angio Chest (PE study)  Result Date: 04/08/2021  1. Segmental and subsegmental pulmonary emboli in the right lower lobe. Mild straightening of the interventricular septum as well as reflux of contrast into the intrahepatic IVC, suggesting right heart strain. These results were discussed with Dr. Brett Canales REDDEN on 04/08/2021 at 5:57 PM on Epic secure messaging. Aldean Ast, MD  04/08/2021 5:58 PM    XR Chest 2 Views    Result Date: 04/08/2021   No acute pulmonary infiltrates are demonstrated. Merri Ray, MD  04/08/2021 2:36 PM

## 2021-05-01 NOTE — Plan of Care (Signed)
Problem: Moderate/High Fall Risk Score >5  Goal: Patient will remain free of falls  Outcome: Progressing  Flowsheets (Taken 05/01/2021 2102)  High (Greater than 13):   HIGH-Visual cue at entrance to patient's room   HIGH-Bed alarm on at all times while patient in bed   HIGH-Utilize chair pad alarm for patient while in the chair   HIGH-Apply yellow "Fall Risk" arm band   HIGH-Initiate use of floor mats as appropriate   HIGH-Consider use of low bed     Problem: Pain interferes with ability to perform ADL  Goal: Pain at adequate level as identified by patient  Outcome: Progressing  Flowsheets (Taken 04/28/2021 2335)  Pain at adequate level as identified by patient:   Identify patient comfort function goal   Assess for risk of opioid induced respiratory depression, including snoring/sleep apnea. Alert healthcare team of risk factors identified.   Assess pain on admission, during daily assessment and/or before any "as needed" intervention(s)   Reassess pain within 30-60 minutes of any procedure/intervention, per Pain Assessment, Intervention, Reassessment (AIR) Cycle   Evaluate if patient comfort function goal is met   Evaluate patient's satisfaction with pain management progress   Include patient/patient care companion in decisions related to pain management as needed     Problem: Safety  Goal: Patient will be free from injury during hospitalization  Outcome: Progressing  Flowsheets (Taken 05/01/2021 2107)  Patient will be free from injury during hospitalization:   Assess patient's risk for falls and implement fall prevention plan of care per policy   Provide and maintain safe environment   Use appropriate transfer methods   Ensure appropriate safety devices are available at the bedside   Include patient/ family/ care giver in decisions related to safety   Hourly rounding   Assess for patients risk for elopement and implement Elopement Risk Plan per policy  Goal: Patient will be free from infection during  hospitalization  Outcome: Progressing  Flowsheets (Taken 05/01/2021 2107)  Free from Infection during hospitalization:   Assess and monitor for signs and symptoms of infection   Monitor lab/diagnostic results   Monitor all insertion sites (i.e. indwelling lines, tubes, urinary catheters, and drains)   Encourage patient and family to use good hand hygiene technique

## 2021-05-01 NOTE — Plan of Care (Signed)
NURSING SHIFT NOTE     Patient: Rick [name]  Day: 4      SHIFT EVENTS     Shift Narrative/Significant Events (PRN med administration, fall, RRT, etc.):   Patient is oriented to self and situation.On room air. Scheduled medication given as ordered pt tolerated well.patient denies having pain. Contact precaution for MRSA in place.RVM in place.    Safety and fall precautions remain in place. Purposeful rounding completed.          ASSESSMENT     Changes in assessment from patient's baseline this shift:    Neuro: No  CV: No  Pulm: No  Peripheral Vascular: No  HEENT: No  GI: No  BM during shift: No, Last BM:    GU: No   Integ: No  MS: No    Pain: None  Pain Interventions: Rest and Positioning  Medications Utilized:     Mobility: PMP Activity: Step 3 - Bed Mobility of Distance Walked (ft) (Step 6,7): 0 Feet           Lines     Patient Lines/Drains/Airways Status       Active Lines, Drains and Airways       Name Placement date Placement time Site Days    Peripheral IV 04/27/21 20 G Right Antecubital 04/27/21  1805  Antecubital  3    External Urinary Catheter 04/30/21  2036  --  less than 1                         VITAL SIGNS     Vitals:    05/01/21 0427   BP: 127/72   Pulse: (!) 56   Resp: 17   Temp: 97.9 F (36.6 C)   SpO2: 98%       Temp  Min: 97.7 F (36.5 C)  Max: 98.3 F (36.8 C)  Pulse  Min: 40  Max: 56  Resp  Min: 17  Max: 18  BP  Min: 103/48  Max: 135/75  SpO2  Min: 96 %  Max: 98 %    No intake or output data in the 24 hours ending 05/01/21 0500       CARE PLAN     Problem: Compromised Tissue integrity  Goal: Damaged tissue is healing and protected  Outcome: Progressing  Flowsheets (Taken 04/30/2021 1516 by Happy Letitia Caul, RN)  Damaged tissue is healing and protected:   Avoid shearing injuries   Reposition patient every 2 hours and as needed unless able to reposition self   Monitor/assess Braden scale every shift   Monitor patient's hygiene practices  Goal: Nutritional status is  improving  Outcome: Progressing  Flowsheets (Taken 05/01/2021 0456)  Nutritional status is improving: Allow adequate time for meals     Problem: Pain interferes with ability to perform ADL  Goal: Pain at adequate level as identified by patient  Outcome: Progressing  Flowsheets (Taken 04/28/2021 2335 by Barnabas Harries, RN)  Pain at adequate level as identified by patient:   Identify patient comfort function goal   Assess for risk of opioid induced respiratory depression, including snoring/sleep apnea. Alert healthcare team of risk factors identified.   Assess pain on admission, during daily assessment and/or before any "as needed" intervention(s)   Reassess pain within 30-60 minutes of any procedure/intervention, per Pain Assessment, Intervention, Reassessment (AIR) Cycle   Evaluate if patient comfort function goal is met   Evaluate patient's satisfaction with pain management progress  Include patient/patient care companion in decisions related to pain management as needed     Problem: Altered GI Function  Goal: Fluid and electrolyte balance are achieved/maintained  Outcome: Progressing  Flowsheets (Taken 05/01/2021 0456)  Fluid and electrolyte balance are achieved/maintained:   Monitor/assess lab values and report abnormal values   Provide adequate hydration   Assess for confusion/personality changes   Monitor intake and output every shift   Observe for cardiac arrhythmias  Goal: Elimination patterns are normal or improving  Outcome: Progressing  Flowsheets (Taken 04/28/2021 0230 by Dellis Anesominic, Oluchi, RN)  Elimination patterns are normal or improving:   Monitor for abdominal distension   Monitor for abdominal discomfort   Administer treatments as ordered   Anticipate/assist with toileting needs   Report abnormal assessment to physician   Assess for normal bowel sounds  Goal: Nutritional intake is adequate  Outcome: Progressing  Flowsheets (Taken 05/01/2021 0456)  Nutritional intake is adequate:   Encourage/perform oral  hygiene as appropriate   Allow adequate time for meals     Problem: Safety  Goal: Patient will be free from injury during hospitalization  Outcome: Progressing  Flowsheets (Taken 05/01/2021 0456)  Patient will be free from injury during hospitalization:   Assess patient's risk for falls and implement fall prevention plan of care per policy   Provide and maintain safe environment   Hourly rounding   Include patient/ family/ care giver in decisions related to safety   Assess for patients risk for elopement and implement Elopement Risk Plan per policy   Ensure appropriate safety devices are available at the bedside  Goal: Patient will be free from infection during hospitalization  Outcome: Progressing  Flowsheets (Taken 05/01/2021 0456)  Free from Infection during hospitalization:   Assess and monitor for signs and symptoms of infection   Monitor lab/diagnostic results   Monitor all insertion sites (i.e. indwelling lines, tubes, urinary catheters, and drains)   Encourage patient and family to use good hand hygiene technique

## 2021-05-01 NOTE — Nursing Progress Note (Signed)
Introduced myself and role to patient. Alert and oriented x 3. Saturating at 99% on room air. No pain reported. On Long term telemetry- Sinus Bradycardia noticed. Received a call from the telemetry room that patient was having V-Tach. Safety and fall bundle remain in place. Purposeful rounding completed. Patient looks comfortable and stable at the time of this documentation. Will continue to monitor patient.

## 2021-05-01 NOTE — Plan of Care (Signed)
Problem: Compromised Tissue integrity  Goal: Damaged tissue is healing and protected  Flowsheets (Taken 05/01/2021 1439)  Damaged tissue is healing and protected:   Increase activity as tolerated/progressive mobility   Reposition patient every 2 hours and as needed unless able to reposition self   Keep intact skin clean and dry     Problem: Altered GI Function  Goal: Fluid and electrolyte balance are achieved/maintained  Flowsheets (Taken 05/01/2021 1439)  Fluid and electrolyte balance are achieved/maintained:   Provide adequate hydration   Monitor intake and output every shift  Goal: Nutritional intake is adequate  Flowsheets (Taken 05/01/2021 1439)  Nutritional intake is adequate:   Allow adequate time for meals   Monitor daily weights     NURSING SHIFT NOTE     Patient: Rick [name]  Day: 4      SHIFT EVENTS     Shift Narrative/Significant Events (PRN med administration, fall, RRT, etc.):   Pt is alert and oriented*2, forgetful, have a video camera in the room, eat independently, on Tele showing sinus brady, schedule med was given, pt is still on isolation, follow command, denied pain.    Safety and fall precautions remain in place. Purposeful rounding completed.          ASSESSMENT     Changes in assessment from patient's baseline this shift:    Neuro: No  CV: No  Pulm: No  Peripheral Vascular: No  HEENT: No  GI: No  BM during shift: No, Last BM:    GU: No   Integ: No  MS: No    Pain: None  Pain Interventions: None  Medications Utilized: none    Mobility: PMP Activity: Step 3 - Bed Mobility of Distance Walked (ft) (Step 6,7): 0 Feet           Lines     Patient Lines/Drains/Airways Status       Active Lines, Drains and Airways       Name Placement date Placement time Site Days    Peripheral IV 04/27/21 20 G Right Antecubital 04/27/21  1805  Antecubital  3    External Urinary Catheter 04/30/21  2036  --  less than 1                         VITAL SIGNS     Vitals:    05/01/21 1347   BP: 107/60   Pulse:  (!) 56   Resp: 16   Temp: 98.2 F (36.8 C)   SpO2: 97%       Temp  Min: 97.7 F (36.5 C)  Max: 98.2 F (36.8 C)  Pulse  Min: 40  Max: 56  Resp  Min: 16  Max: 17  BP  Min: 103/48  Max: 131/64  SpO2  Min: 96 %  Max: 98 %    No intake or output data in the 24 hours ending 05/01/21 1440         Remove Oxygen Weaning section if not applicable.  OXYGEN WEANING     Stable to wean?: No    Attempted to wean?: No       Symptoms:     Oxygen saturation at rest maintained at:  97% at room air          Oxygen saturation with ambulation maintained at:  98% at room air          Patient self-proning?: No:      Patient using  incentive spirometer?: No:    Amount achieved on incentive spirometer?: No data recorded      CARE PLAN

## 2021-05-01 NOTE — Progress Notes (Signed)
[facility]  PROGRESS NOTE      Patient: Rick [name]  Date: 05/01/2021   LOS: 4 Days  Admission Date: 04/27/2021   MRN: [id number]  Attending: Vella Raring, MD  Please contact me on the following Spectralink [phone]           SUBJECTIVE     Fever No    Eating cleaned his breakfast tray today  Cough No    Diarrhea No    Sleep Yes  Voiding Yes    MEDICATIONS     Current Facility-Administered Medications   Medication Dose Route Frequency    acetaminophen  650 mg Oral 4 times per day    apixaban  5 mg Oral Q12H SCH    memantine  20 mg Oral QPM    rosuvastatin  5 mg Oral QHS    sacubitril-valsartan  0.5 tablet Oral Q12H SCH    senna-docusate  1 tablet Oral QHS       PHYSICAL EXAM     Vitals:    05/01/21 1347   BP: 107/60   Pulse: (!) 56   Resp: 16   Temp: 98.2 F (36.8 C)   SpO2: 97%       Temperature: Temp  Min: 97.7 F (36.5 C)  Max: 98.2 F (36.8 C)  Pulse: Pulse  Min: 40  Max: 56  Respiratory: Resp  Min: 16  Max: 17  Non-Invasive BP: BP  Min: 103/48  Max: 131/64  Pulse Oximetry SpO2  Min: 96 %  Max: 98 %      Intake and Output Summary (Last 24 hours) at Date Time  No intake or output data in the 24 hours ending 05/01/21 1730      GEN APPEARANCE: Patient better hydrated  HEENT: PERLA; Icterus No    NECK: Supple; No bruits  CVS: RRR, S1, S2; systolic murmur with valvular click  LUNGS: Air Entry Equal; No Wheezes; No Rhonchi: No rales  ABD: Soft; Bowl Sounds; Yes  GU: Foley No  R inguinal hernia dressing removed by Dr. Allena Katz today, no hematoma  EXT: No edema; Pulses 2+ and intact  Skin exam: Cold No    NEURO: CN 2-12 intact; No Focal neurological deficits: No Flaps      Exam done by Vella Raring, MD on 05/01/21 at 5:30 PM      LABS     Recent Labs   Lab 05/01/21  0428 04/30/21  1623 04/30/21  0349   WBC 4.45 5.75 7.24   RBC 3.13* 3.43* 3.40*   Hgb 10.8* 11.8* 11.8*   Hematocrit 32.4* 35.4* 35.3*   MCV 103.5* 103.2* 103.8*   Platelets 74* 81* 74*       Recent Labs   Lab 05/01/21  0428  04/30/21  1623 04/30/21  0349 04/29/21  0351 04/28/21  0406   Sodium 140 142 141 145 145   Potassium 4.1 4.1 4.4 4.6 4.6   Chloride 111 112* 110 112* 108   CO2 23 23 23 25 29    BUN 32.0* 25.0 23.0 26.0 37.0*   Creatinine 1.4 1.2 1.1 1.3 1.4   Glucose 97 125* 107* 122* 108*   Calcium 8.7 9.0 9.1 9.4 9.9       Recent Labs   Lab 05/01/21  0428 04/30/21  0349 04/29/21  0351 04/27/21  1814   ALT  --   --   --  16   AST (SGOT)  --   --   --  26   Bilirubin, Total  --   --   --  0.7   Albumin 2.6* 2.8* 3.1* 3.9   Alkaline Phosphatase  --   --   --  69       Recent Labs   Lab 04/27/21  1814   hs Troponin-I 17.6             Microbiology Results (last 15 days)       Procedure Component Value Units Date/Time    COVID-19 (SARS-CoV-2) only ([facility]) asymptomatic admission [[id number]] Collected: 04/27/21 2146    Order Status: Completed Specimen: Nasopharyngeal Updated: 04/27/21 2213     Purpose of COVID testing Screening     SARS-CoV-2 Specimen Source Nasal Swab     SARS CoV 2 Overall Result Not Detected     Comment: __________________________________________________  -A result of "Detected" indicates POSITIVE for the    presence of SARS CoV-2 RNA  -A result of "Not Detected" indicates NEGATIVE for the    presence of SARS CoV-2 RNA  __________________________________________________________  Test performed using the Roche cobas [facility] SARS-CoV-2 assay. This assay is  only for use under the Food and Drug Administration's Emergency Use  Authorization. This is a real-time RT-PCR assay for the qualitative  detection of SARS-CoV-2 RNA. Viral nucleic acids may persist in vivo,  independent of viability. Detection of viral nucleic acid does not imply the  presence of infectious virus, or that virus nucleic acid is the cause of  clinical symptoms. Negative results do not preclude SARS-CoV-2 infection and  should not be used as the sole basis for diagnosis, treatment or other  patient management decisions. Negative results must be  combined with  clinical observations, patient history, and/or epidemiological information.  Invalid results may be due to inhibiting substances in the specimen and  recollection should occur. Please see Fact Sheets for patients and providers  located:  [facility]less[facility]LBlog.no         Narrative:      o Collect and clearly label specimen type:  o PREFERRED-Upper respiratory specimen: One Nasal Swab in  Transport Media.  o Hand deliver to laboratory ASAP  Indication for testing->Extended care facility admission to  semi private room  Screening    COVID-19 (SARS-CoV-2) only ([facility]) asymptomatic admission [[id number]] Collected: 04/27/21 2146    Order Status: Sent Specimen: Nasopharyngeal Updated: 04/27/21 2146    Narrative:      o Collect and clearly label specimen type:  o PREFERRED-Upper respiratory specimen: One Nasal Swab in  Transport Media.  o Hand deliver to laboratory ASAP  Indication for testing->Extended care facility admission to  semi private room  Screening             RADIOLOGY     No results found.     ASSESSMENT/PLAN     Rick [name] is a 88 y.o. male admitted with Incarcerated inguinal hernia    Interval Summary:     Patient Active Hospital Problem List:    Right inguinal hernia  Small bowel obstruction  -Presented with right lower quadrant abdominal pain that started earlier today  -Hemodynamically stable.  Abdomen is soft and nontender  -CT A/P showed small bowel obstruction with transition point at site of new right-sided inguinal hernia.   -Evaluated by Dr. Allena Katz, surgery and hernia reduced  -Plan: S/P herniorrhaphy after  cardiac risk stratification. Restarted Eliquis 04/30/2021 advance diet.  Anticipate [place] home in AM if no drop in Hb, no hematoma and OK with  surgery     Recent acute pulmonary embolism with cor pulmonale    AS s/p TAVR 2021, hypertension, HLD who was recently admitted with acute PE and discharged home on Eliquis, now presented to the hospital  on 04/27/2021 with right inguinal hernia with severe pain.  Plan: restart Eliquis, recheck Hb in AM     CAD status post CABG  Cardiomyopathy  Combined systolic and diastolic chronic congestive heart failure  Nonrheumatic aortic valve stenosis (12/23/2018)     S/P TAVR (transcatheter aortic valve replacement)  -LVEF 25 to 30%  -Monitor I&O and daily weight  Previously could not tolerate Aldactone and Jardiance due to hypotension  Plan: Looks comfortable supine. holding Lasix for now.      Hypertension  -Resumed Entresto in a.m. if vitals are stable     Vascular dementia (04/28/2021)  -On Namenda     Nutrition  Heart healthy diet     DVT/VTE Prophylaxis   Resume Eliquis        Upon my review: Anticipate discharge in a.m.    Signed,  Vella Raring, MD  5:30 PM 05/01/2021

## 2021-05-02 LAB — RENAL FUNCTION PANEL
Albumin: 2.5 g/dL — ABNORMAL LOW (ref 3.5–5.0)
Anion Gap: 5 (ref 5.0–15.0)
BUN: 29 mg/dL — ABNORMAL HIGH (ref 9.0–28.0)
CO2: 24 mEq/L (ref 17–29)
Calcium: 8.6 mg/dL (ref 7.9–10.2)
Chloride: 110 mEq/L (ref 99–111)
Creatinine: 1.1 mg/dL (ref 0.5–1.5)
Glucose: 92 mg/dL (ref 70–100)
Phosphorus: 3.2 mg/dL (ref 2.3–4.7)
Potassium: 4.3 mEq/L (ref 3.5–5.3)
Sodium: 139 mEq/L (ref 135–145)

## 2021-05-02 LAB — CBC
Absolute NRBC: 0 10*3/uL (ref 0.00–0.00)
Hematocrit: 33.4 % — ABNORMAL LOW (ref 37.6–49.6)
Hgb: 11.3 g/dL — ABNORMAL LOW (ref 12.5–17.1)
MCH: 34.5 pg — ABNORMAL HIGH (ref 25.1–33.5)
MCHC: 33.8 g/dL (ref 31.5–35.8)
MCV: 101.8 fL — ABNORMAL HIGH (ref 78.0–96.0)
MPV: 12.4 fL (ref 8.9–12.5)
Nucleated RBC: 0 /100 WBC (ref 0.0–0.0)
Platelets: 84 10*3/uL — ABNORMAL LOW (ref 142–346)
RBC: 3.28 10*6/uL — ABNORMAL LOW (ref 4.20–5.90)
RDW: 13 % (ref 11–15)
WBC: 3.87 10*3/uL (ref 3.10–9.50)

## 2021-05-02 LAB — GFR: EGFR: >60

## 2021-05-02 NOTE — Progress Notes (Addendum)
The referral w/FTF and H&P were faxed to the agency      Start [facility] Note  Home Health Referral    Referral from Westley Chandler (Case Manager) for home health care upon discharge.    By [employer], the patient has the right to freely choose a home care provider.    A company of the patients choosing. We have supplied the patient with a listing of providers in your area who asked to be included and participate in Medicare.   [facility], formerly [facility], a home care agency that provides adult home care services and participates in Medicare   The preferred provider of your insurance company. Choosing a home care provider other than your insurance company's preferred provider may affect your insurance coverage.      Home Health Discharge Information    Your doctor has ordered Skilled Nursing, Physical Therapy, and Occupational Therapy in-home service(s) for you while you recuperate at home, to assist you in the transition from hospital to home.    The agency that you or your representative chose to provide the service:  [facility] [phone]    Resumption of care          The above services were set up by:  Tamela Gammon, RN [facility] [place] [facility] Liaison) Phone [phone]      IF YOU HAVE NOT HEARD FROM YOUR HOME YOUR HOME HEALTH AGENCY WITHIN 24-48 HOURS AFTER DISCHARGE PLEASE CALL YOUR AGENCY TO ARRANGE A TIME FOR YOUR FIRST VISIT. FOR ANY SCHEDULING CONCERNS OR QUESTIONS RELATED TO HOME HEALTH, SUCH AS TIME OR DATE PLEASE CONTACT YOUR HOME HEALTH AGENCY AT THE NUMBER LISTED ABOVE.    Additional comments:        START PATIENT REGISTRATION INFORMATION     Order Information  Order Signing Physician: Estanislado Emms, MD     Service Ordered RN ?: Yes  Service Ordered PT ?: Yes  Service Ordered OT ?: Yes    Care Coordination     Primary Care Physician:Sean Arlester Marker, MD  Primary Care Physician Phone:[phone]  Primary Care Physician Address: [address] / [place] [address]  PCP NPI:  [id number]  Service Discharge Location Type: Home      Demographics  Patient Last Name: [name]   Patient First Name: Rick  Language/Communication Barrier: none   Service Address: [address]  [place] [address]   Service Home Phone: [phone] (home)   Other phone numbers:    Telephone Information:   Mobile [phone]     Emergency Contact: Extended Emergency Contact Information  Primary Emergency Contact: Minette Headland II,Hamza  Address: [address]           [place], [place] [address] [place]  Home Phone: [phone]  Mobile Phone: [phone]  Relation: Son  Secondary Emergency Contact: Amrein,Debra  Home Phone: [phone]  Relation: Daughter in law  Primary Phone Number:  [phone] (Pt's son)  Secondary Phone Number:  [phone] Home    Admission Information  Admit Date: 04/27/2021  Patient Status at discharge: Inpatient  Admitting Diagnosis: Anticoagulated [Z79.01]  Incarcerated inguinal hernia [K40.30]       HITECH  NO      END PATIENT REGISTRATION INFORMATION       Diagnosis:   Incarcerated inguinal hernia   S/P OPEN RIGHT HERNIORRHAPHY, INGUINAL WITH MESH   Acute renal failure with tubular necrosis   Vascular dementia   CAD status post CABG   Cardiomyopathy   Combined  systolic and diastolic chronic congestive heart failure   Nonrheumatic aortic valve stenosis (12/23/2018)      S/P TAVR (transcatheter aortic valve replacement)   HTN     Start [facility] Summary      COVID-19 Screening:    COVID-19   Negative                                                                                 Additional Comments:     End [facility] Summary     Discharge Date:  05/02/21    Referral Source  Signed by: Tamela Gammon, RN  Date Time: 05/02/21 3:07 PM      End [facility] Note         Home Health face-to-face (FTF) Encounter (Order [id number])  Consult  Date: 05/02/2021 Department: [facility] Ordering/Authorizing: Estanislado Emms, MD     Order Information    Order Date/Time Release Date/Time  Start Date/Time End Date/Time   05/02/21 03:07 PM None 05/02/21 03:02 PM 05/02/21 03:02 PM     Order Details    Frequency Duration Priority Order Class   Once 1  occurrence Routine Hospital Performed     Standing Order Information    Remaining Occurrences Interval Last Released     0/1 Once 05/02/2021              Provider Information    Ordering User Ordering Provider Authorizing Provider   Tamela Gammon, RN Bachour, Dallas Breeding, MD Estanislado Emms, MD   Attending Provider(s) Admitting Provider PCP   Maryella Shivers, MD; Juliane Poot, MD PhD; Santa Genera, MD; Alecia Lemming, MD; Vella Raring, MD; Jani Files, MD; Estanislado Emms, MD Santa Genera, MD Erasmo Score, MD     Verbal Order Info    Action Created on Order Mode Entered by Responsible Provider Signed by Signed on   Ordering 05/02/21 1507 Telephone with Merlene Laughter, RN Bachour, Dallas Breeding, MD             Comments    Diagnosis:   Incarcerated inguinal hernia   S/P OPEN RIGHT HERNIORRHAPHY, INGUINAL WITH MESH   Acute renal failure with tubular necrosis   Vascular dementia   CAD status post CABG   Cardiomyopathy   Combined systolic and diastolic chronic congestive heart failure   Nonrheumatic aortic valve stenosis (12/23/2018)      S/P TAVR (transcatheter aortic valve replacement)   HTN         Home nursing required for skilled assessment including cardiopulmonary assessment and dietary education for disease management, and medication instruction. Home PT/OT required for gait and balance training, strengthening, mobility, fall prevention, and ADL training.                Home Health face-to-face (FTF) Encounter: Patient Communication     Not Released  Not seen         Order Questions    Question Answer   Date I saw the patient face-to-face: 05/02/2021   Evidence this patient is homebound because: C.  Decreased endurance, strength, ROM, cadence, safety/judgment during mobility    G.  Fall risk due to impaired coordination, gait  and decreased balance    Medical conditions that necessitate Home Health care: B.  Functional impairment due to recent hospitalization/procedure/treatment    C.  Risk for complication/infection/pain requiring follow up and monitoring    E.  Exacerbation of disease requiring follow up monitoring   Per clinical findings, following services are medically necessary: PT    OT    Skilled Nursing   Clinical findings that support the need for Skilled Nursing. SN will: C. Monitor for signs and symptoms of exacerbation of disease and management    D. Review medication reconciliation, manage and educate on use and side effects    H. Assess cardiopulmonary status and monitor for signs &symptoms of exacerbation    I.  Educate dietary and or fluid restrictions and weight management   Clinical findings that support the need for Physical Therapy. PT will A.  Evaluate and treat functional impairment and improve mobility    C.  Educate on weight bearing status, stair/gait training, balance & coordination    D.  Provide services to help restore function, mobility, and releive pain    E.  Educate on functional mobility; bed, chair, sit, stand and transfer activities    F.  Perform home safety assessment & develop safe in home exercise program    G.  Implement activities to improve stance time, cadence & step length    I.  Instruct on restorative activities to restore ability to perform ADL   OT will provide assistance with: Home program to improve ability to perform ADLs    Basic motor function and reasoning abilities    Strategies to compensate for loss of function   Other (please specify) See comments for further orders.                    Process Instructions    Please select Home Care Services medically necessary.     Based on the above findings, I certify that this patient is confined to the home and needs intermittent skilled nursing care, physical therapry and / or speech therapy or continues to need occupational therapy. The patient is under my care,  and I have initiated the establishment of the plan of care. This patient will be followed by a physician who will periodically review the plan of care.      Collection Information            Consult Order Info    ID Description Priority Start Date Start Time   [id number] Home Health face-to-face (FTF) Encounter Routine 05/02/2021  3:02 PM   Provider Specialty Referred to   ______________________________________ _____________________________________                         Verbal Order Info    Action Created on Order Mode Entered by Responsible Provider Signed by Signed on   Ordering 05/02/21 1507 Telephone with Merlene Laughter, RN Estanislado Emms, MD             Patient Information    Patient Name   [name] Legal Sex   Male DOB   September 13, 1924       Reprint Order Requisition    Home Health face-to-face (FTF) Encounter (Order #[id number]) on 05/02/21       Additional Information    Associated Reports External References   Priority and Order Details [employer]      [facility]  [facility]  Patient Name: THURL, BOEN     MRN: [id number]      CSN: [id number]         Account Information     Hosp Acct #   [id number] Patient Class   Inpatient Service  Medicine Accommodation Code  Semi-Private      Admission Information     Admitting Physician:  Attending Physician: Santa Genera, MD  Estanislado Emms, MD Unit  [facility] L&D Status      Admitting Diagnosis: Anticoagulated; Incarcerated inguinal hernia Room / Bed  [phone] L&D - Last Menstrual Cycle      Chief Complaint: Groin Pain     Admit Type:  Admit Date/Time:  Discharge Date/Time: Emergency  04/27/2021 / 6578   /  Length of Stay: 5 Days    L&D EDD   Estimated Date of Delivery: None noted.      Patient Information              Home Address: [address]  [place] [address] Employer:  Employer Address:       ,     Main Phone: [phone] Employer Phone:     SSN: [id number]       DOB: Aug 23, 1924 (89 yrs)       Sex:  Male Primary Care Physician: Erasmo Score, MD   Marital Status: Widowed Referring Physician:       No ref. provider found   Race: Black or African American       Ethnicity: Not of Hispanic/Latino/S*       Emergency Contacts  Name Home Phone Work Phone Mobile Phone Relationship DARROLL, BREDESON [phone]   [phone] KHALEEL, BECKOM [phone]     Daughter in*           Guarantor Information     Guarantor Name: TEIGE, ROUNTREE Guarantor ID: [id number]   Guarantor Relationship to Pt: Self Guarantor Type: Personal/Family   Guarantor DOB:    1924-10-22       Guarantor Address: [address]   [place], [place] [address]          Guarantor Home Phone: [phone] Guarantor Employer:        Guarantor Work Phone:   Pensions consultant Emp Phone:                     Chief Strategy Officer Name: [employer] Subscriber Name: [employer]   Insurance Address:    [address]  [place]  [address] Subscriber DOB: Feb 02, 1925      Subscriber ID: [id number]   Insurance Phone:   Pt Relationship to Sub:   Self   Insurance ID:         Group Name:   Preauthorization #: NPR   Group #:   Preauthorization Days:        Tourist information centre manager Name: [employer] Subscriber Name: [facility]   Insurance Address:    [address]  [place], [place] [address] Subscriber DOB: May 02, 1924      Subscriber ID: [id number]   Insurance Phone: [phone] Pt Relationship to Sub:   Self   Insurance ID:         Group Name:   Preauthorization #:     Group #:   Preauthorization Days:        [employer] Name: - Subscriber Name:  Insurance Address:       ,   Subscriber DOB:        Subscriber ID:     Insurance Phone:   Pt Relationship to Sub:       Insurance ID:         Group Name:   Preauthorization #:     Group #:   Preauthorization Days:           05/02/2021 3:11 PM

## 2021-05-02 NOTE — Plan of Care (Signed)
Problem: Pain interferes with ability to perform ADL  Goal: Pain at adequate level as identified by patient  Flowsheets (Taken 04/28/2021 2335 by Barnabas Harries, RN)  Pain at adequate level as identified by patient:   Identify patient comfort function goal   Assess for risk of opioid induced respiratory depression, including snoring/sleep apnea. Alert healthcare team of risk factors identified.   Assess pain on admission, during daily assessment and/or before any "as needed" intervention(s)   Reassess pain within 30-60 minutes of any procedure/intervention, per Pain Assessment, Intervention, Reassessment (AIR) Cycle   Evaluate if patient comfort function goal is met   Evaluate patient's satisfaction with pain management progress   Include patient/patient care companion in decisions related to pain management as needed   Note; pt alert and oriented x2. Skin warm an dry. Respirations eupnea. No c/o sob, cough or dyspnea. No c/o chest pressure or tightness. Abd soft and non-distended. No c/o n/v and tolerating po diet. Voiding with difficulty. No edema with ppp. No c/o pain or discomfort. Plan; will continue to monitor pt's for s/sx of pain through out the shift.

## 2021-05-02 NOTE — OT Eval Note (Signed)
Occupational Therapy Eval and Treatment Rick [name]        Post Acute Care Therapy Recommendations   Discharge Recommendations:  Home with supervision, Home with home health OT, Home with home health PT (assist for ADLs/OOB mobility needed)    If recommended discharge disposition is not available, patient will require the following assistance: SNF    DME needs IF patient is discharging home: [facility]    Therapy discharge recommendations may change with patient status.  Please refer to most recent note for up-to-date recommendations.    Is PT evaluation indicated at this time? This patient is already on PT caseload.    [facility]  Bed: R6045/W0981.B      ___________________________________________________    Time of Evaluation and Treatment:  Time Calculation  OT Received On: 05/02/21  Start Time: 1127  Stop Time: 1147  Time Calculation (min): 20 min    Chart Review and Collaboration with Care Team: 5 minutes, not included in above time.    Evaluation: 8 minutes  Treatment:  12 minutes    OT Visit Number: 1    Consult received for Rick [name] for OT Evaluation and Treatment.  Patient's medical condition is appropriate for Occupational therapy intervention at this time.    Activity Orders:  OT eval and treat and progressive mobility protocol    Precautions and Contraindications:  Precautions  Weight Bearing Status: no restrictions  Fall Risks: High, Impaired balance/gait, Impaired mobility, Muscle weakness, Mental status change  Other Precautions: Contact Precautions, Bleeding Precautions    Personal Protective Equipment (PPE)  gloves, procedure gown, and procedure mask    Medical Diagnosis:  Anticoagulated [Z79.01]  Incarcerated inguinal hernia [K40.30]    History of Present Illness:  Rick [name] is a 88 y.o. male admitted on 04/27/2021 with severe, constant, aching bulge in the right groin area. Pt dx with right inguinal hernia an SBO. Pt s/p OPEN RIGHT  HERNIORRHAPHY, INGUINAL WITH MESH on 3/14.    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    Dry cough    Aortic valve stenosis, etiology of cardiac valve disease unspecified    S/P TAVR (transcatheter aortic valve replacement)    Difficulty in walking, not elsewhere classified    Dementia without behavioral disturbance, unspecified dementia type    Balance disorder    Chronic systolic CHF (congestive heart failure)    Acute pulmonary embolism with acute cor pulmonale, unspecified pulmonary embolism type    Pulmonary embolism with acute cor pulmonale, unspecified chronicity, unspecified pulmonary embolism type    Incarcerated inguinal hernia    Acute renal failure with tubular necrosis    Vascular dementia    Thrombocytopenia        Past Medical/Surgical History:  Past Medical History:   Diagnosis Date    Atrial enlargement, bilateral Echo result 04/04/19    CAD (coronary artery disease) 05/28/19 Cath results.    Severe 3V CAD.  Patent LIMA to LAD, patent SVG to OM 1 and OM 2 of circumflex CA, & patent SVG to RCA.    Chronic combined systolic and diastolic heart failure 310/21 OV Dr. Penny Pia    EF=30%    Dementia 04/2014  Difficulty walking 06/2016    Post surgery of lt knee, rt femur    Dyslipidemia 310/21 OV Dr. Penny Pia    Dyspnea on exertion 02/2018    HTN (hypertension) 310/21 OV Dr. Penny Pia    Mild cognitive impairment 310/21 OV Dr. Penny Pia    Primary cardiomyopathy 11/2017    RBBB 07/10/19 EKg result    S/P CABG (coronary artery bypass graft)      LIMA to LAD,  SVG to OM 1 & OM 2 of circumflex CA, & SVG to RCA.     Past Surgical History:   Procedure Laterality Date    CORONARY ARTERY BYPASS GRAFT  2003     approx year: LIMA to LAD,  SVG to OM 1 & OM 2 of  circumflex CA, & SVG to RCA    FRACTURE SURGERY Right 06/2016    femur    HERNIORRHAPHY, INGUINAL Right 04/29/2021    Procedure: OPEN RIGHT HERNIORRHAPHY, INGUINAL WITH MESH;  Surgeon: Towanda Octave, MD;  Location: [facility];  Service: General;  Laterality: Right;    PERCUTANEOUS VALVULOPLASTY - AORTIC N/A 07/12/2019    Procedure: TAVR #2;  Surgeon: Arlyss Queen, MD PhD;  Location: [facility];  Service: Cardiovascular;  Laterality: N/A;  23 HR OB    REPLACEMENT, AORTIC VALVE , EDWARDS SAPIEN N/A 07/12/2019    Procedure: REPLACEMENT, AORTIC VALVE , EDWARDS SAPIEN -- TAVR Valve:   S3 29mm -- Access: RTF -- IC: Dr. Penny Pia;  Surgeon: Christene Lye, MD;  Location: [facility] HEART OR;  Service: Cardiothoracic;  Laterality: N/A;    RIGHT & LEFT HEART CATH POSSIBLE PCI N/A 05/26/2019    Procedure: RIGHT & LEFT HEART CATH POSSIBLE PCI;  Surgeon: Arlyss Queen, MD PhD;  Location: [facility];  Service: Cardiovascular;  Laterality: N/A;  [id number] scheduled with Maira in dr's office. OP. kr    TAVR PERCUTANEOUS FEMORAL  07/13/2019         TOTAL KNEE ARTHROPLASTY Left 2017    TOTAL KNEE ARTHROPLASTY Right 2005        X-Rays/Tests/Labs  Lab Results   Component Value Date/Time    HGB 11.3 (L) 05/02/2021 03:56 AM    HCT 33.4 (L) 05/02/2021 03:56 AM    K 4.3 05/02/2021 03:56 AM    NA 139 05/02/2021 03:56 AM    INR 1.2 (H) 07/10/2019 02:22 PM    TROPI 17.6 04/27/2021 06:14 PM    TROPI 26.2 04/09/2021 01:10 AM    TROPI 33.2 04/08/2021 02:33 PM       All imaging reviewed. Please see chart for details      Social History:  Prior Level of Function  Prior level of function: Needs assistance with ADLs, Ambulates with assistive device  Assistive Device: Other (Comment) (rollator)  Baseline Activity Level: Household ambulation  Driving: does not drive  Cooking: No  Employment: Retired  DME Currently at Home: ADL- [employer], [place], [place], ADL- Paediatric nurse, Other (Comment) (rollator)    Home Living  Arrangements  Living Arrangements: Family members  Type of Home: House  Home Layout: Multi-level, Engineer, structural (no STE)  Bathroom Shower/Tub: Medical sales representative: Midwife: [employer] in Air traffic controller, Paediatric nurse  DME Currently at Home: ADL- [employer], [place], [place], ADL- Paediatric nurse, Other (Comment) (rollator)  Home Living - Notes / Comments: Pt reports son assists with ADLs if needed      Subjective:      "I  can do more for you."    Patient is agreeable to participation in the therapy session.      Pain Assessment  Pain Assessment: Numeric Scale (0-10)  Pain Score: 1-mild pain  Pain Location: Abdomen  Pain Intervention(s): Therapeutic presence      Objective:     Observation of Patient/Vital Signs: Stable    Cognitive Status and Neuro Exam:  Cognition/Neuro Status  Arousal/Alertness: Appropriate responses to stimuli  Attention Span: Appears intact  Orientation Level: Oriented to person, Oriented to place, Oriented to time, Disoriented to situation (initially said 1923, but self corrected)  Memory: Decreased short term memory, Decreased recall of recent events  Following Commands: independent  Safety Awareness: minimal verbal instruction  Insights: Decreased awareness of deficits, Educated in safety awareness  Behavior: attentive, calm, cooperative  Motor Planning: intact    Musculoskeletal Examination  Gross ROM  Right Upper Extremity ROM: within functional limits  Left Upper Extremity ROM: within functional limits  Gross Strength  Right Upper Extremity Strength: 4/5  Left Upper Extremity Strength: 4/5       Activities of Daily Living  Self-care and Home Management  Grooming: Supervision, edge of bed, teeth care  LB Dressing: Maximal Assist, edge of bed, Don/doff R sock, Don/doff L sock  Toileting: other (Comment) (external cath use)    Functional Mobility:  Mobility and Transfers  Supine to Sit: Supervision  Sit to Supine: Supervision  Sit to Stand: Contact Guard Assist  Functional  Mobility/Ambulation: Minimal Assist (with RW)     Balance  Balance  Static Sitting Balance: Supervision  Static Standing Balance: Contact Guard Assist    Participation and Activity Tolerance  Participation and Endurance  Participation Effort: good  Endurance: Tolerates 10 - 20 min exercise with multiple rests    Educated the Patient to role of occupational therapy, plan of care, goals of therapy and safety with mobility and ADLs with verbalized understanding .    Patient left in bed with alarm and all other medical equipment in place and call bell and all personal items/needs within reach.   CM team and attending have been previously notified of d/c recommendation; no updates to d/c recommendation since that time.      Assessment:  Rick [name] is a 88 y.o. male admitted 04/27/2021. OT Assessment: decreased strength;balance deficits;decreased independence with ADLs;decreased safety awareness;decreased endurance/activity tolerance      Treatment:  - Received Pt in bed and agreeable to OT evaluation.   - Facilitated LB dressing and oral care while seated EOB. Instructed Pt that he may require assist with LB dressing after d/c.  - Instructed Pt in STS from EOB and safe hand placement. Pt with poor carryover, but able to stand with min a.  - Facilitated ambulation in room with RW. Min a required for turning walker. Bilateral knee flexion noted during ambulation.   - Returned Pt to bed at end of session. Pt left with all needs met.     Rehabilitation Potential: Prognosis: Fair, With continued OT s/p acute discharge    Plan:  OT Frequency Recommended: 1-2x/wk   Treatment Interventions: ADL retraining, Functional transfer training, Patient/Family training, Equipment eval/education, Continued evaluation, Endurance training, UE strengthening/ROM     PMP Activity: Step 6 - Walks in Room  Distance Walked (ft) (Step 6,7): 10 Feet    Risks/benefits/POC discussed    Goals:  Time For Goal Achievement: by time of  discharge  ADL Goals  Patient will groom self: Risk analyst, at  sinkside  Patient will dress upper body: Supervision  Patient will toilet: Supervision  Mobility and Transfer Goals  Pt will perform functional transfers: Contact Guard Assist                           Beverly Gust, OTR/L    Physical Medicine and Rehabilitation  [facility]  [phone]    05/02/2021  12:19 PM    [facility]  Patient: Rick [name] MRN#: [id number]   [facility] Bed: N8295/A2130.B

## 2021-05-02 NOTE — Progress Notes (Signed)
POST OPERATIVE PROGRESS NOTE  [facility]    Date Time: 05/02/21 8:18 AM  Patient Name: Rick [name]      ASSESSMENT:   3 Days Post-Op S/P Procedure(s):  OPEN RIGHT HERNIORRHAPHY, INGUINAL WITH MESH    Doing well restarted eliquis  PLAN:   Ok to discharge from surgical standpoint  Management per medicine        SUBJECTIVE:   The patient is doing well.  Post operative pain is well controlled with medications.  Current dietary status:  Diet heart healthy and tolerating well.  Flatus: yes. BM:  no.  Additional complaints: none       OBJECTIVE:   Current Vitals:   Vitals:    05/02/21 0359   BP: 121/66   Pulse: (!) 53   Resp: 18   Temp: 98.1 F (36.7 C)   SpO2: 97%       Intake and Output Summary (Last 24 hours):  I/O last 3 completed shifts:  In: 200 [P.O.:200]  Out: 250 [Urine:250]    Labs:     Results       Procedure Component Value Units Date/Time    CBC without differential [[id number]]  (Abnormal) Collected: 05/02/21 0356    Specimen: Blood Updated: 05/02/21 0501     WBC 3.87 x10 3/uL      Hgb 11.3 g/dL      Hematocrit 48.0 %      Platelets 84 x10 3/uL      RBC 3.28 x10 6/uL      MCV 101.8 fL      MCH 34.5 pg      MCHC 33.8 g/dL      RDW 13 %      MPV 12.4 fL      Nucleated RBC 0.0 /100 WBC      Absolute NRBC 0.00 x10 3/uL     GFR [[id number]] Collected: 05/02/21 0356     Updated: 05/02/21 0421     EGFR >60.0    Renal function panel [[id number]]  (Abnormal) Collected: 05/02/21 0356    Specimen: Blood Updated: 05/02/21 0421     Glucose 92 mg/dL      Sodium 920 mEq/L      Potassium 4.3 mEq/L      Chloride 110 mEq/L      CO2 24 mEq/L      BUN 29.0 mg/dL      Calcium 8.6 mg/dL      Creatinine 1.1 mg/dL      Albumin 2.5 g/dL      Phosphorus 3.2 mg/dL      Anion Gap 5.0            Rads:     Radiology Results (24 Hour)       ** No results found for the last 24 hours. **            Physical Exam:     Mental status - alert, oriented to person, place, and time, normal mood, behavior, speech, dress,  motor activity, and thought processes  Chest - clear to auscultation, no wheezes, rales or rhonchi, symmetric air entry  Heart - normal rate, regular rhythm, normal S1, S2, no murmurs, rubs, clicks or gallops  Abdomen - soft, nontender, nondistended, no masses or organomegaly  Wound - clean, dry, no drainage, right groin.   Extremities - peripheral pulses normal, no pedal edema, no clubbing or cyanosis      Signed by: Delena Serve, MD

## 2021-05-02 NOTE — Discharge Summary (Signed)
[facility]      Patient: Rick [name]  Admission Date: 04/27/2021   DOB: 1924/02/19  Discharge Date: 05/02/2021   MRN: [id number]  Discharge Attending:Tabetha Haraway Reuel DerbyBachour, MD     Referring Physician: Erasmo ScorePham, Sean, MD  PCP: Erasmo ScorePham, Sean, MD       DISCHARGE SUMMARY     Discharge Information   Admission Diagnosis:   Incarcerated inguinal hernia    Discharge Diagnosis:   Active Hospital Problems    Diagnosis    Thrombocytopenia    Acute renal failure with tubular necrosis    Vascular dementia    Incarcerated inguinal hernia    Chronic systolic CHF (congestive heart failure)    S/P TAVR (transcatheter aortic valve replacement)    Nonrheumatic aortic valve stenosis        Admission Condition: Guarded  Discharge Condition: Stable  Consultants: general surgery, cardiology  Functional Status: at baseline, walks with walker  Discharged to: home with home health  Surgeries:OPEN RIGHT HERNIORRHAPHY, INGUINAL WITH MESH      Imaging:     XR Abdomen Portable    Result Date: 04/28/2021  1. Nasogastric tube in abnormal position. This finding was discussed with the patient's nurse, Oluchi, on 04/28/2021 12:39 AM . 2. Findings of small bowel obstruction. Jorene GuestLisa Lull, MD 04/28/2021 12:39 AM    Chest AP Portable    Result Date: 04/27/2021   Feeding tube noted with distal tip suspected to be in a right lower lobe bronchus. Provided history that patient's ER clinician Dr. Lucianne MussKumar was already aware and removed the patient's feeding tube. Ivin PootJennifer Park, MD 04/27/2021 8:12 PM    CT Abd/Pelvis without Contrast    Result Date: 04/27/2021   1. Small bowel obstruction with transition point at site of new right-sided inguinal hernia. 2. Remainder as above. Ivin PootJennifer Park, MD 04/27/2021 7:32 PM    US Venous Low Extrem Duplx Dopp Comp Bilat    Result Date: 04/09/2021   NO EVIDENCE OF INTRALUMINAL THROMBUS OR OBSTRUCTION TO VENOUS FLOW IN RIGHT OR LEFT LOWER EXTREMITY. PULSATILE VENOUS FLOW MAY REFLECT RIGHT HEART DYSFUNCTION AND/OR FLUID OVERLOAD. Wyvonne LenzMichael  Burke, MD  04/09/2021 12:23 PM    CT Angio Chest (PE study)    Result Date: 04/08/2021  1. Segmental and subsegmental pulmonary emboli in the right lower lobe. Mild straightening of the interventricular septum as well as reflux of contrast into the intrahepatic IVC, suggesting right heart strain. These results were discussed with Dr. Brett CanalesLUKE E REDDEN on 04/08/2021 at 5:57 PM on Epic secure messaging. Aldean AstJane Kim, MD  04/08/2021 5:58 PM    XR Chest 2 Views    Result Date: 04/08/2021   No acute pulmonary infiltrates are demonstrated. Merri Rayichard Barbu, MD  04/08/2021 2:36 PM    Echo Results       None          Discharge Medications:     Medication List        CONTINUE taking these medications      acetaminophen 325 MG tablet  Commonly known as: TYLENOL  Take 2 tablets (650 mg total) by mouth every 4 (four) hours as needed for Pain     apixaban 5 MG  Commonly known as: ELIQUIS  Take 1 tablet (5 mg) by mouth every 12 (twelve) hours     docusate sodium 100 MG capsule  Commonly known as: COLACE     memantine 10 MG tablet  Commonly known as: NAMENDA  Take 2 tablets (20 mg total) by mouth  every evening     multivitamin capsule     rosuvastatin 20 MG tablet  Commonly known as: CRESTOR     sacubitril-valsartan 24-26 MG Tabs per tablet  Commonly known as: ENTRESTO  Take 1 tablet by mouth 2 (two) times daily 1/2     vitamin D 25 MCG (1000 UT) tablet  Commonly known as: CHOLECALCIFEROL            STOP taking these medications      furosemide 40 MG tablet  Commonly known as: LASIX                  Hospital Course   Presentation History / Hospital Course (5 Days):  Rick [name] is a 88 y.o. male with a PMHx of CAD status post CABG, cardiomyopathy, combined systolic and diastolic congestive heart failure, hypertension, dyslipidemia, dementia who was last admitted on 04/09/2021 with acute pulmonary embolism with cor pulmonale and currently on Eliquis presented to the ED after he was transferred from [facility] right inguinal hernia with  severe pain.  Patient was evaluated by Dr. Allena Katz and his hernia reduced.      In the ED, BP 222/90 with pulse rate of 90 and temperature of 98.5 saturating 95%.  His lab work revealed WBC of 4.2, hemoglobin 12.8, creatinine 1.4, troponin 17.6 and proBNP of 1530.  UA negative for nitrite and leukocyte esterase.  CT A/P showed small bowel obstruction with transition point at site of new  right-sided inguinal hernia.    Initial chest x-ray showed feeding tube noted with distal tip suspected to be in a right lower lobe bronchus [NG tube was removed out by the ED].  LVEF 25 to 30% on echo on 04/09/2021.  He received morphine in the ED.  Hospitalist service consulted for further evaluation and management    Pt was seen by cardiology for surgical clearance and then underwent open herniorrhaphy with mesh. His eliquis was resumed post operatively and he tolerated a regular diet.  His home medications were resumed other than his home lasix. Advised to take it only if having LE swelling or SOB. This should be re evaluated on his OP follow up in 1 week    Plan of care discussed with pt's son over the phone      RECOMMENDATIONS:    - Patient was informed of abnormal and incidental imaging findings during hospitalization, and advised to review this information with their  medical provider.             Best Practices   Was the patient admitted with either a CHF Exacerbation or Pneumonia? NO     Progress Note/Physical Exam at Discharge     Subjective: Patient reported feeling well, and is ready for discharge.    Vitals:    05/01/21 2302 05/02/21 0359 05/02/21 0842 05/02/21 1310   BP: 135/68 121/66 164/69 108/56   Pulse: (!) 53 (!) 53 61 (!) 54   Resp: 18 18 18 18    Temp: 98.2 F (36.8 C) 98.1 F (36.7 C) 98.2 F (36.8 C) 98 F (36.7 C)   TempSrc: Oral Oral Oral Oral   SpO2: 96% 97% 98% 95%   Weight:       Height:           General: NAD  HEENT: sclera anicteric, OP: Clear, MMM  Cardiovascular: RRR, no m/r/g  Lungs: CTAB, no  w/r/r  Abdomen: soft, +BS, NT/ND, no masses, no g/r  Extremities: Warm and well  perfused  Skin: no rashes or lesions noted on exposed surfaces  Neuro: Answers questions appropriately, responds to commands       Diagnostics     Labs/Studies Pending at Discharge:    Last Labs   Recent Labs   Lab 05/02/21  0356 05/01/21  0428 04/30/21  1623   WBC 3.87 4.45 5.75   RBC 3.28* 3.13* 3.43*   Hgb 11.3* 10.8* 11.8*   Hematocrit 33.4* 32.4* 35.4*   MCV 101.8* 103.5* 103.2*   Platelets 84* 74* 81*       Recent Labs   Lab 05/02/21  0356 05/01/21  0428 04/30/21  1623 04/30/21  0349 04/29/21  0351   Sodium 139 140 142 141 145   Potassium 4.3 4.1 4.1 4.4 4.6   Chloride 110 111 112* 110 112*   CO2 24 23 23 23 25    BUN 29.0* 32.0* 25.0 23.0 26.0   Creatinine 1.1 1.4 1.2 1.1 1.3   Glucose 92 97 125* 107* 122*   Calcium 8.6 8.7 9.0 9.1 9.4       Microbiology Results (last 15 days)       Procedure Component Value Units Date/Time    COVID-19 (SARS-CoV-2) only (Liat Rapid) asymptomatic admission [[id number]] Collected: 04/27/21 2146    Order Status: Completed Specimen: Nasopharyngeal Updated: 04/27/21 2213     Purpose of COVID testing Screening     SARS-CoV-2 Specimen Source Nasal Swab     SARS CoV 2 Overall Result Not Detected     Comment: __________________________________________________  -A result of "Detected" indicates POSITIVE for the    presence of SARS CoV-2 RNA  -A result of "Not Detected" indicates NEGATIVE for the    presence of SARS CoV-2 RNA  __________________________________________________________  Test performed using the Roche cobas Liat SARS-CoV-2 assay. This assay is  only for use under the Food and Drug Administration's Emergency Use  Authorization. This is a real-time RT-PCR assay for the qualitative  detection of SARS-CoV-2 RNA. Viral nucleic acids may persist in vivo,  independent of viability. Detection of viral nucleic acid does not imply the  presence of infectious virus, or that virus nucleic acid is the cause  of  clinical symptoms. Negative results do not preclude SARS-CoV-2 infection and  should not be used as the sole basis for diagnosis, treatment or other  patient management decisions. Negative results must be combined with  clinical observations, patient history, and/or epidemiological information.  Invalid results may be due to inhibiting substances in the specimen and  recollection should occur. Please see Fact Sheets for patients and providers  located:  WirelessDSLBlog.no         Narrative:      o Collect and clearly label specimen type:  o PREFERRED-Upper respiratory specimen: One Nasal Swab in  Transport Media.  o Hand deliver to laboratory ASAP  Indication for testing->Extended care facility admission to  semi private room  Screening    COVID-19 (SARS-CoV-2) only ([facility] Rapid) asymptomatic admission [[id number]] Collected: 04/27/21 2146    Order Status: Sent Specimen: Nasopharyngeal Updated: 04/27/21 2146    Narrative:      o Collect and clearly label specimen type:  o PREFERRED-Upper respiratory specimen: One Nasal Swab in  Transport Media.  o Hand deliver to laboratory ASAP  Indication for testing->Extended care facility admission to  semi private room  Screening             Patient Instructions   Discharge Diet: Heart healthy  Discharge Activity: As  tolerated  LABS/TESTING recommended after discharge    Follow Up Appointment:   Follow-up Information       Erasmo Score, MD .    Specialty: Family Medicine  Contact information:  [address]  [address]  [place] [address]  [phone]               Towanda Octave, MD Follow up in 2 week(s).    Specialty: Surgery  Contact information:  [address]  [address]  [place] [address]  [phone]                              Time spent examining patient, discussing with patient/family regarding hospital course, chart review, reconciling medications and discharge planning: > 35 minutes.  This patient was examined by me on , the day of  discharge.    Signed,  Estanislado Emms, MD    1:45 PM 05/02/2021

## 2021-05-02 NOTE — Discharge Instr - AVS First Page (Addendum)
Reason for your Hospital Admission:  Incarcerated hernia      Instructions for after your discharge:  Continue your home medications other than lasix, follow up with your PCP in 1 week to see if you need to resume this  Please resume lasix before that if you get leg swelling or shortness of breath  Avoid constipation by taking stool softeners daily         Home Health Discharge Information    Your doctor has ordered Skilled Nursing, Physical Therapy, and Occupational Therapy in-home service(s) for you while you recuperate at home, to assist you in the transition from hospital to home.    The agency that you or your representative chose to provide the service:  [facility] [phone]    Resumption of care          The above services were set up by:  Tamela Gammon, RN [facility] Liaison) Phone [phone]      IF YOU HAVE NOT HEARD FROM YOUR HOME YOUR HOME HEALTH AGENCY WITHIN 24-48 HOURS AFTER DISCHARGE PLEASE CALL YOUR AGENCY TO ARRANGE A TIME FOR YOUR FIRST VISIT. FOR ANY SCHEDULING CONCERNS OR QUESTIONS RELATED TO HOME HEALTH, SUCH AS TIME OR DATE PLEASE CONTACT YOUR HOME HEALTH AGENCY AT THE NUMBER LISTED ABOVE.

## 2021-05-02 NOTE — Progress Notes (Signed)
@  1435: PACC spoke w/ Pt's son at the number: [phone] to discuss HH. Pt's says that Pt has received HH visits from [facility] . Per Pt's son, HH already set up w/ [facility] and want to keep the same agency. [facility] will resume of care for the patient. The referral w/[facility] for ROC will be faxed to the agency per Pt's family request.                Tamela Gammon, RN  Post Acute Care Coordinator

## 2021-05-02 NOTE — Progress Notes (Signed)
Pt will d/c Home with family and Home Health.  CM made referral to [facility] for follow up appointment.  Pt's son agreed to provide transport.  No further needs assessed at this time.   05/02/21 1421   Discharge Disposition   Patient preference/choice provided? Yes   Physical Discharge Disposition Home, Home Health   Mode of Transportation Car   Patient/Family/POA notified of transfer plan Yes   Patient agreeable to discharge plan/expected d/c date? Yes   Family/POA agreeable to discharge plan/expected d/c date? Yes   Bedside nurse notified of transport plan? Yes   CM Interventions   Multidisciplinary rounds/family meeting before d/c? Yes   Medicare Checklist   Is this a Medicare patient? Yes

## 2021-05-02 NOTE — PT Progress Note (Addendum)
Physical Therapy Treatment  Rick [name]  Post Acute Care Therapy Recommendations   Discharge Recommendations:  Home with supervision, Home with home health PT, Home with home health OT (Pt's family able to provide supervision and assistance w/ ADLs)    If recommended discharge disposition is not available, patient will require the following assistance: SNF    DME needs IF patient is discharging home: Medical alert system    Therapy discharge recommendations may change with patient status.  Please refer to most recent note for up-to-date recommendations.    Is an Occupational Therapy Evaluation Indicated at this time? This patient is already on OT caseload.      Unit: [facility] [address]  Bed: Z6109/U0454.B    ___________________________________________________    Time of treatment:  Time Calculation  PT Received On: 05/02/21  Start Time: 0938  Stop Time: 1003  Time Calculation (min): 25 min            Chart Review and Collaboration with Care Team: 5 minutes, not included in above time.    PT Visit Number: 2    ___________________________________________________    Precautions:   Precautions  Weight Bearing Status: no restrictions  Fall Risks: High, Impaired balance/gait, Impaired mobility, Muscle weakness, Mental status change  Other Precautions: Contact Precautions, Bleeding Precautions    Personal Protective Equipment (PPE)  gloves, procedure gown, procedure mask, and shoe covers    Updated X-Rays/Tests/Labs:  Lab Results   Component Value Date/Time    HGB 11.3 (L) 05/02/2021 03:56 AM    HCT 33.4 (L) 05/02/2021 03:56 AM    K 4.3 05/02/2021 03:56 AM    NA 139 05/02/2021 03:56 AM    INR 1.2 (H) 07/10/2019 02:22 PM    TROPI 17.6 04/27/2021 06:14 PM    TROPI 26.2 04/09/2021 01:10 AM    TROPI 33.2 04/08/2021 02:33 PM       All imaging reviewed, please see chart for details.      Subjective: " I will try and work with you"      Patient Goal: to get better    Pain Assessment  Pain Assessment:  No/denies pain           Patient's medical condition is appropriate for Physical Therapy intervention at this time.  Patient is agreeable to participation in the therapy session. Nursing clears patient for therapy.      Objective:  Observation of Patient/Vital Signs:    Vitals: Supine at rest    05/02/21   BP: 164/69   Pulse: 61   Resp: 18   Temp: 98.2 F (36.8 C)   SpO2: 98%         Cognition/Neuro Status  Arousal/Alertness: Appropriate responses to stimuli  Attention Span: Appears intact  Orientation Level: Disoriented to place;Disoriented to time;Disoriented to situation;Oriented to person  Memory: Decreased long term memory;Decreased short term memory  Following Commands: Follows one step commands with repetition;Follows one step commands with increased time  Safety Awareness: moderate verbal instruction  Insights: Decreased awareness of deficits;Educated in safety awareness  Problem Solving: Assistance required to identify errors made  Behavior: calm;attentive;cooperative  Motor Planning: intact  Coordination: [facility] impaired    Musculoskeletal Examination  Gross ROM  Right Lower Extremity ROM: within functional limits  Left Lower Extremity ROM: within functional limits               Functional Mobility  Sit to Stand: Moderate Assist;Increased Time;Increased Effort;with instruction for hand placement to increase safety (  Trunk Support)        Distance Walked (ft) (Step 6,7): 20 Feet    Therapeutic Exercise  Heelslides: 2x10 BLE AAROM  Hip Abduction: 2x10 BLE AAROM  Knee AROM Short Arc Quad: 2x10 BLE  Ankle Pumps: 2x10 BLE  Standing heel raises, mini squats, marches using walker for support: 2x10 for each      Strengthening: patient education                 Educated the Patient to role of physical therapy, plan of care, goals of therapy and HEP, safety with mobility and ADLs, energy conservation techniques, home safety with limited indication of understanding.  Additional education required..    Patient left in  bed with alarm and all other medical equipment in place and call bell and all personal items/needs within reach.  RN notified of session outcome. CM team and attending have been previously notified of d/c recommendation; no updates to d/c recommendation since that time.      Assessment: Pt progressing well with goals and was able to tolerate and perform all LE therapeutic exercises. Patient was also able to safely ambulate a short distance in the room with no major LOB noted however did required some steadying with directional turns and verbal instruction for upright posture. Further OOB mobility was deferred 2/2 pt fatigue.   Pt will continue to benefit from physical therapy services to improve strength and functional mobility to maximize safety and independence.       PMP Activity: Step 6 - Walks in Room  Distance Walked (ft) (Step 6,7): 20 Feet    Plan:  Treatment/Interventions: Exercise, Gait training, Neuromuscular re-education, LE strengthening/ROM, Functional transfer training, Endurance training, Cognitive reorientation, Patient/family training, Equipment eval/education, Bed mobility      PT Frequency: 1-2x/wk   Continue plan of care.    Goals:  Goals  Goal Formulation: With patient/family  Time for Goal Acheivement: By time of discharge  Goals: Select goal  Pt Will Roll Left: modified independent, to maximize functional mobility and independence, Goal met  Pt Will Go Supine To Sit: modified independent, to maximize functional mobility and independence (progressing-still req's SBA)  Pt Will Perform Sit To Supine: modified independent, to maximize functional mobility and independence, Partly met (progressing-still req's SBA)  Pt Will Perform Sit to Stand: with supervision, to maximize functional mobility and independence, Partly met (progressing-still req's min A to CGA)  Pt Will Transfer Bed/Chair: with rolling walker, modified independent, to maximize functional mobility and independence, Partly met  (progressing-still req's CGA)  Pt Will Ambulate: 31-50 feet, with rolling walker, with supervision, to maximize functional mobility and independence, Partly met (progressing-still req's min A to CGA)      Carney Living, PT, DPT  Physical Therapist  Physical Medicine & Rehabilitation  [phone]  Mon-Fri 6:30-3pm  05/02/2021 10:18 AM               [facility]  Patient: Rick [name] MRN#: [id number]  Unit: [facility] [address] Bed: E9528/U1324.B

## 2021-05-02 NOTE — Progress Notes (Signed)
Discharge instructions, prescriptions and follow up appt reviewed with pt's son ,verbalized understanding. Encouraged questions, all questions were addressed. Iv was was removed and pt was take to lobby via w/c.

## 2021-05-07 ENCOUNTER — Other Ambulatory Visit: Payer: Self-pay

## 2021-05-07 NOTE — Progress Notes (Signed)
Ambulatory Care Management Progress Note    Care Plan Update    Goals:    1. Get home health PT/OT  2. Get help navigating post-hospital care  3. Get more information on management of CHF      Pt was discharged from the hospital on 3/17 s/p open R inguinal herniorrhaphy with mesh  Pt was ordered for ROC of home health services from [facility].     Returned call from pt's son who is requesting transfer of HH care from [facility] [place] [facility] to [facility]. Son said they have not heard back from [facility] after being promised by supervisor last week of start of care yesterday, 3/21. This issue has happened in the past and son wants to change [facility] agency. LVMM for Cora Daniels of [facility].     Son said pt is doing well after surgery. Good appetite. Independent with ADLs and back at baseline.     Pt has history of HF. No longer on diuretic. We reviewed signs of HF exacerbation and son said pt is not having any of them at this time. Pt checks weight daily and weight hovers around 145 lbs, with no reportable weight change.       ADDENDUM 11:10 AM:   Spoke with Iona Hansen of [facility]. They will check pt's insurance and will call NN back.          ADDENDUM 12:17 PM:   Per Iona Hansen of [facility], they can take pt. Spoke with Gavin Potters of  [facility] [phone] to relay need to discharge pt from their service. Tiara said she will call pt's son and call NN back.       ADDENDUM 1:25 PM:   Per Tiara of [facility], they will discharge pt today from their care. Iona Hansen of [facility] made aware. They will call pt's son within 48 hours. Pt's son made aware.     NN scheduled pt for a PCP post-hospital follow up on 3/28. Son will schedule follow up with surgeon.      Requested for pt or son to call NN for any care management questions or needs in between NN's outreaches. Pt's son verbalized understanding and appreciation for the support provided. Will continue to follow.      [id number] Vivien Presto RN, BSN, CCM, MBA  Patient Care Navigator  Ambulatory Care Management  [facility]  [phone]

## 2021-05-08 ENCOUNTER — Other Ambulatory Visit: Payer: Self-pay

## 2021-05-08 NOTE — Progress Notes (Signed)
Ambulatory Care Management Progress Note    Pt's son called yesterday and requested pt's skilled home health services to be transferred from [facility] [phone], [phone]) to [facility], d/t former not calling or starting care as they say. [facility] care from [facility] to [facility] was initiated yesterday, and [facility] said they will discharge pt from their care yesterday.     Call received from pt's son today. [facility] has sent a therapist to pt's home this morning. Son has decided to have pt stay with [facility]. [facility] also called. They did not discharge the pt yesterday as they told [facility] and said pt has decided to stay with them. Cora Daniels of [facility] made aware and they will cancel referral.       Wendy-May Vivien Presto, RN, BSN, CCM, MBA  Patient Care Navigator  Ambulatory Care Management  [facility]  [phone]

## 2021-05-12 ENCOUNTER — Encounter (INDEPENDENT_AMBULATORY_CARE_PROVIDER_SITE_OTHER): Payer: Self-pay

## 2021-05-13 ENCOUNTER — Ambulatory Visit (INDEPENDENT_AMBULATORY_CARE_PROVIDER_SITE_OTHER): Payer: Medicare Other | Admitting: Student in an Organized Health Care Education/Training Program

## 2021-05-13 ENCOUNTER — Encounter (INDEPENDENT_AMBULATORY_CARE_PROVIDER_SITE_OTHER): Payer: Self-pay | Admitting: Student in an Organized Health Care Education/Training Program

## 2021-05-13 VITALS — BP 121/57 | HR 63 | Temp 97.2°F | Resp 20

## 2021-05-13 DIAGNOSIS — I5022 Chronic systolic (congestive) heart failure: Secondary | ICD-10-CM

## 2021-05-13 DIAGNOSIS — K4030 Unilateral inguinal hernia, with obstruction, without gangrene, not specified as recurrent: Secondary | ICD-10-CM

## 2021-05-13 DIAGNOSIS — I2609 Other pulmonary embolism with acute cor pulmonale: Secondary | ICD-10-CM

## 2021-05-13 DIAGNOSIS — M79671 Pain in right foot: Secondary | ICD-10-CM

## 2021-05-13 NOTE — Progress Notes (Signed)
Subjective:      Patient ID: Rick [name] is a 88 y.o. male.    Chief Complaint:  Chief Complaint   Patient presents with    PE     Hospital follow up    Bronchitis     Hospital follow up       HPI   Here for follow-up from recent hospitalization.   Inpatient facility: [facility]  Date of admission: March 12  Date of discharge: March 17  Discharge diagnosis:  Incarcerated inguinal hernia  Hospital course:   "Rick [name] is a 88 y.o. male with a PMHx of CAD status post CABG, cardiomyopathy, combined systolic and diastolic congestive heart failure, hypertension, dyslipidemia, dementia who was last admitted on 04/09/2021 with acute pulmonary embolism with cor pulmonale and currently on Eliquis presented to the ED after he was transferred from [facility] right inguinal hernia with severe pain.  Patient was evaluated by Dr. Allena Katz and his hernia reduced.      In the ED, BP 222/90 with pulse rate of 90 and temperature of 98.5 saturating 95%.  His lab work revealed WBC of 4.2, hemoglobin 12.8, creatinine 1.4, troponin 17.6 and proBNP of 1530.  UA negative for nitrite and leukocyte esterase.  CT A/P showed small bowel obstruction with transition point at site of new  right-sided inguinal hernia.    Initial chest x-ray showed feeding tube noted with distal tip suspected to be in a right lower lobe bronchus [NG tube was removed out by the ED].  LVEF 25 to 30% on echo on 04/09/2021.  He received morphine in the ED.  Hospitalist service consulted for further evaluation and management     Pt was seen by cardiology for surgical clearance and then underwent open herniorrhaphy with mesh. His eliquis was resumed post operatively and he tolerated a regular diet.  His home medications were resumed other than his home lasix. Advised to take it only if having LE swelling or SOB. This should be re evaluated on his OP follow up in 1 week     Plan of care discussed with pt's son over the  phone"    05/13/2021 -patient presents with his son for follow-up.  Reports normal healing of right inguinal repair.  Regular BM.  No GU symptoms.  Denies pain/drainage from surgical site.  Will follow-up with general surgery.  Compliant with Eliquis.  Has follow-up with hematology as well.  No CP/dyspnea.  Getting home health.    Prior hospitalization within past 30 days: yes  Prior hospitalization within past 6 months: No  Comorbid conditions:  See hospital course  Medical procedure(s) performed:  See hospital course  Surgical procedure(s) performed:  See hospital course  Diagnostic tests performed:  See hospital course  Pending tests: none  Home care: skilled nursing assisting with care  Specialist follow-up: general surgery and hematology  Post-hospital monitoring:  none  Mobility status: wheelchair bound     Medication Reconciliation(required):  Home medications were reviewed and reconciled with current medication list within electronic medical record.    Advanced Directives:  Has an Scientist, water quality. A copy has been provided and is in chart.    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    Dry cough    Aortic valve stenosis, etiology of cardiac valve disease unspecified    S/P TAVR (transcatheter aortic valve replacement)    Difficulty in walking, not elsewhere classified    Dementia without behavioral disturbance, unspecified dementia type    Balance disorder    Chronic systolic CHF (congestive heart failure)    Acute pulmonary embolism with acute cor pulmonale, unspecified pulmonary embolism type    Pulmonary embolism with acute cor pulmonale, unspecified chronicity, unspecified pulmonary embolism type    Incarcerated inguinal hernia    Acute renal  failure with tubular necrosis    Vascular dementia    Thrombocytopenia       Outpatient Medications Marked as Taking for the 05/13/21 encounter (Office Visit) with Erasmo Score, MD   Medication Sig Dispense Refill    acetaminophen (TYLENOL) 325 MG tablet Take 2 tablets (650 mg total) by mouth every 4 (four) hours as needed for Pain      apixaban (ELIQUIS) 5 MG Take 1 tablet (5 mg) by mouth every 12 (twelve) hours 90 tablet 0    docusate sodium (COLACE) 100 MG capsule Take 1 capsule (100 mg) by mouth 2 (two) times daily      memantine (NAMENDA) 10 MG tablet Take 2 tablets (20 mg total) by mouth every evening 180 tablet 0    Multiple Vitamin (multivitamin) capsule Take 1 capsule by mouth daily      rosuvastatin (CRESTOR) 20 MG tablet       sacubitril-valsartan (ENTRESTO) 24-26 MG Tab per tablet Take 1 tablet by mouth 2 (two) times daily 1/2 60 tablet 0    vitamin D (CHOLECALCIFEROL) 25 MCG (1000 UT) tablet Take 2 tablets (2,000 Units) by mouth Once every Monday, Wednesday and Friday morning         No Known Allergies    Past Medical History:   Diagnosis Date    Atrial enlargement, bilateral Echo result 04/04/19    CAD (coronary artery disease) 05/28/19 Cath results.    Severe 3V CAD.  Patent LIMA to LAD, patent SVG to OM 1 and OM 2 of circumflex CA, & patent SVG to RCA.    Chronic combined systolic and diastolic heart failure 310/21 OV Dr. Penny Pia    EF=30%    Dementia 04/2014    Difficulty walking 06/2016    Post surgery of lt knee, rt femur    Dyslipidemia 310/21 OV Dr. Penny Pia    Dyspnea on exertion 02/2018    HTN (hypertension) 310/21 OV Dr. Penny Pia    Mild cognitive impairment 310/21 OV Dr. Penny Pia    Primary cardiomyopathy 11/2017    RBBB 07/10/19 EKg result    S/P CABG (coronary artery bypass graft)      LIMA to LAD,  SVG to OM 1 & OM 2 of circumflex CA, & SVG to RCA.       Past Surgical History:   Procedure Laterality Date    CORONARY ARTERY BYPASS GRAFT  2003     approx year: LIMA to LAD,  SVG to OM 1 & OM 2 of  circumflex CA, & SVG to RCA    FRACTURE SURGERY Right 06/2016    femur    HERNIORRHAPHY, INGUINAL Right 04/29/2021    Procedure: OPEN RIGHT HERNIORRHAPHY, INGUINAL WITH MESH;  Surgeon: Towanda Octave, MD;  Location: [facility];  Service: General;  Laterality: Right;    PERCUTANEOUS VALVULOPLASTY - AORTIC N/A 07/12/2019    Procedure: TAVR #2;  Surgeon:  Damluji, Susette Racer, MD PhD;  Location: [facility];  Service: Cardiovascular;  Laterality: N/A;  23 HR OB    REPLACEMENT, AORTIC VALVE , EDWARDS SAPIEN N/A 07/12/2019    Procedure: REPLACEMENT, AORTIC VALVE , EDWARDS SAPIEN -- TAVR Valve:   S3 29mm -- Access: RTF -- IC: Dr. Penny Pia;  Surgeon: Christene Lye, MD;  Location: [facility] OR;  Service: Cardiothoracic;  Laterality: N/A;    RIGHT & LEFT HEART CATH POSSIBLE PCI N/A 05/26/2019    Procedure: RIGHT & LEFT HEART CATH POSSIBLE PCI;  Surgeon: Arlyss Queen, MD PhD;  Location: [facility];  Service: Cardiovascular;  Laterality: N/A;  [id number] scheduled with Maira in dr's office. OP. kr    TAVR PERCUTANEOUS FEMORAL  07/13/2019         TOTAL KNEE ARTHROPLASTY Left 2017    TOTAL KNEE ARTHROPLASTY Right 2005       Family History   Problem Relation Age of Onset    No known problems Mother     No known problems Father     No known problems Sister     No known problems Brother        Social History     Tobacco Use    Smoking status: Never    Smokeless tobacco: Never   Vaping Use    Vaping status: Never Used   Substance Use Topics    Alcohol use: Not Currently    Drug use: Never         The following sections were reviewed this encounter by the provider:         Review of Systems   Constitutional:  Negative for appetite change, chills, fatigue, fever and unexpected weight change.   HENT:  Negative for congestion, rhinorrhea and sore throat.    Eyes:  Negative for visual disturbance.   Respiratory:  Negative for cough and shortness of breath.    Cardiovascular:  Negative for chest pain and palpitations.    Gastrointestinal:  Negative for abdominal pain and blood in stool.   Endocrine: Negative for cold intolerance and heat intolerance.   Genitourinary:  Negative for difficulty urinating.   Musculoskeletal:  Negative for arthralgias.        Right foot pain   Neurological:  Negative for dizziness, weakness, numbness and headaches.   Hematological:  Negative for adenopathy.   Psychiatric/Behavioral:  Negative for dysphoric mood. The patient is not nervous/anxious.    All other systems reviewed and are negative.      Vitals:  BP 121/57 (BP Site: Left arm, Patient Position: Sitting, Cuff Size: Medium)   Pulse 63   Temp 97.2 F (36.2 C) (Oral)   Resp 20     Objective:     Physical Exam  Constitutional:       Appearance: Normal appearance.   HENT:      Head: Normocephalic and atraumatic.      Nose: Nose normal.      Mouth/Throat:      Mouth: Mucous membranes are moist.      Pharynx: Oropharynx is clear.   Eyes:      Extraocular Movements: Extraocular movements intact.      Conjunctiva/sclera: Conjunctivae normal.      Pupils: Pupils are equal, round, and reactive to light.   Cardiovascular:      Rate and Rhythm: Normal rate and regular rhythm.      Pulses: Normal pulses.      Heart sounds: Normal heart sounds.  Pulmonary:      Effort: Pulmonary effort is normal.      Breath sounds: Normal breath sounds.   Abdominal:      General: Abdomen is flat. Bowel sounds are normal.      Palpations: Abdomen is soft.   Musculoskeletal:         General: Normal range of motion.      Cervical back: Normal range of motion and neck supple.      Comments: Right foot with deformity of MTPs, no redness, mild edema   Skin:     General: Skin is warm and dry.   Neurological:      General: No focal deficit present.      Mental Status: He is alert and oriented to person, place, and time.   Psychiatric:         Mood and Affect: Mood normal.         Behavior: Behavior normal.         Assessment:     1. Incarcerated inguinal hernia  -Status post  surgical repair, normal wound healing, follow-up with surgeon.  - Referral to General Surgery - EXTERNAL; Future    2. Acute pulmonary embolism with acute cor pulmonale, unspecified pulmonary embolism type  Stable, continue AC, has follow-up with hematology    3. Chronic systolic CHF (congestive heart failure)  Stable, continue Entresto, monitor weights    4. Right foot pain  - Referral to Podiatric Surgery; Future      Plan:   Disease/Illness Education:  Patient educated regarding symptom management, adherence to current medication regimen and current plan of care. Patient encouraged to contact us with any questions or needs.  Patient agreeable to current plan of care and verbalizes understanding of information provided.  Home Health Needs:  home health nursing   Community Service Needs:  No community services needed at this time.  Establishment of Referral Orders:  New referrals entered into EMR - see orders section  Communication with Health Care Providers:  No active communication with additional health care providers needed at this time.  [place] 90+ day Treatment Plan:  Not applicable    Transitional Care Management Coding Criteria:  1) Communication with the patient and/or family was made within 2 business days of discharge.  2) Face-to-face visit occurred within 14 days of discharge.  3) Complexity of medical decision making is high.      Erasmo Score, MD

## 2021-05-13 NOTE — Progress Notes (Signed)
Have you seen any specialists/other providers since your last visit with [place]?    No    Arm preference verified?   Yes, LEFT ARM    Health Maintenance Due   Topic Date Due    Medicare Annual Wellness Visit  Never done    Shingrix Vaccine 50+ (1) Never done    COVID-19 Vaccine (4 - Booster for Pfizer series) 02/19/2020    FALLS RISK ANNUAL  05/01/2021

## 2021-05-14 ENCOUNTER — Encounter (INDEPENDENT_AMBULATORY_CARE_PROVIDER_SITE_OTHER): Payer: Self-pay | Admitting: Student in an Organized Health Care Education/Training Program

## 2021-05-16 ENCOUNTER — Encounter (INDEPENDENT_AMBULATORY_CARE_PROVIDER_SITE_OTHER): Payer: Self-pay | Admitting: Student in an Organized Health Care Education/Training Program

## 2021-05-16 ENCOUNTER — Encounter (INDEPENDENT_AMBULATORY_CARE_PROVIDER_SITE_OTHER): Payer: Self-pay

## 2021-05-19 ENCOUNTER — Encounter (INDEPENDENT_AMBULATORY_CARE_PROVIDER_SITE_OTHER): Payer: Self-pay | Admitting: Student in an Organized Health Care Education/Training Program

## 2021-05-20 ENCOUNTER — Other Ambulatory Visit (INDEPENDENT_AMBULATORY_CARE_PROVIDER_SITE_OTHER): Payer: Self-pay | Admitting: Student in an Organized Health Care Education/Training Program

## 2021-05-20 DIAGNOSIS — F0390 Unspecified dementia without behavioral disturbance: Secondary | ICD-10-CM

## 2021-05-20 NOTE — Telephone Encounter (Signed)
Patient request this medications to be send to different pharmacy.

## 2021-05-21 MED ORDER — APIXABAN 5 MG PO TABS
5.0000 mg | ORAL_TABLET | Freq: Two times a day (BID) | ORAL | 1 refills | Status: DC
Start: 2021-05-21 — End: 2021-06-11

## 2021-05-21 MED ORDER — SACUBITRIL-VALSARTAN 24-26 MG PO TABS
0.5000 | ORAL_TABLET | Freq: Two times a day (BID) | ORAL | 3 refills | Status: DC
Start: 2021-05-21 — End: 2022-07-20

## 2021-05-22 NOTE — Progress Notes (Signed)
HISTORY OF PRESENT ILLNESS  Rick [name] is 88 y.o. male who comes in for an initial consultation. He has a history of atherosclerosis s/p CABG, aortic stenosis s/p TAVR, vascular dementia, acute renal insufficiency, low back pain and balance disorder. He presents due to new finding of bilateral PE on 04/08/21. CTA showed segmental and subsegmental pulmonary emboli in the right lower lobe. There was also mild straightening of the interventricular septum as well as reflux of contrast into the intrahepatic IVC, suggesting right heart strain. Bilateral dopplers did not show evidence of clot. Echo showed severely reduced EF of 25-30%. CT of the abdomen and pelvis in 04/2021 showed small bowel obstruction with transition point at site of new right-sided inguinal hernia. He was started on Eliquis for his clot.     Labs show anemia with hemoglobin of 11.8 and thrombocytopenia with platelets of 70-80. He has mildly elevated creatinine of 1.2-1.5.     Today Rick [name] presents in a wheelchair with his son. He lives with his son. Unfortunately he has gotten weaker over time and a couple of weeks ago, he fell despite using a walker. He, thankfully, did not hit his head and has not had any new falls sent.     No Known Allergies  Current Outpatient Medications   Medication Sig Dispense Refill    acetaminophen (TYLENOL) 325 MG tablet Take 2 tablets (650 mg total) by mouth every 4 (four) hours as needed for Pain      apixaban (ELIQUIS) 5 MG Take 1 tablet (5 mg) by mouth every 12 (twelve) hours 60 tablet 1    docusate sodium (COLACE) 100 MG capsule Take 1 capsule (100 mg) by mouth 2 (two) times daily      memantine (NAMENDA) 10 MG tablet Take 2 tablets (20 mg total) by mouth every evening 180 tablet 0    Multiple Vitamin (multivitamin) capsule Take 1 capsule by mouth daily      rosuvastatin (CRESTOR) 20 MG tablet       sacubitril-valsartan (ENTRESTO) 24-26 MG Tab per tablet Take 0.5 tablets by mouth 2 (two) times daily  90 tablet 3    vitamin D (CHOLECALCIFEROL) 25 MCG (1000 UT) tablet Take 2 tablets (2,000 Units) by mouth Once every Monday, Wednesday and Friday morning       No current facility-administered medications for this visit.     Past Medical History:   Diagnosis Date    Atrial enlargement, bilateral Echo result 04/04/19    CAD (coronary artery disease) 05/28/19 Cath results.    Severe 3V CAD.  Patent LIMA to LAD, patent SVG to OM 1 and OM 2 of circumflex CA, & patent SVG to RCA.    Chronic combined systolic and diastolic heart failure 310/21 OV Dr. Penny Pia    EF=30%    Dementia 04/2014    Difficulty walking 06/2016    Post surgery of lt knee, rt femur    Dyslipidemia 310/21 OV Dr. Penny Pia    Dyspnea on exertion 02/2018    HTN (hypertension) 310/21 OV Dr. Penny Pia    Mild cognitive impairment 310/21 OV Dr. Penny Pia    Primary cardiomyopathy 11/2017    RBBB 07/10/19 EKg result    S/P CABG (coronary artery bypass graft)      LIMA to LAD,  SVG to OM 1 & OM 2 of circumflex CA, & SVG to RCA.     Past Surgical History:   Procedure Laterality Date    CORONARY ARTERY BYPASS GRAFT  2003  approx year: LIMA to LAD,  SVG to OM 1 & OM 2 of circumflex CA, & SVG to RCA    FRACTURE SURGERY Right 06/2016    femur    HERNIORRHAPHY, INGUINAL Right 04/29/2021    Procedure: OPEN RIGHT HERNIORRHAPHY, INGUINAL WITH MESH;  Surgeon: Towanda Octave, MD;  Location: [facility];  Service: General;  Laterality: Right;    PERCUTANEOUS VALVULOPLASTY - AORTIC N/A 07/12/2019    Procedure: TAVR #2;  Surgeon: Arlyss Queen, MD PhD;  Location: [facility];  Service: Cardiovascular;  Laterality: N/A;  23 HR OB    REPLACEMENT, AORTIC VALVE , EDWARDS SAPIEN N/A 07/12/2019    Procedure: REPLACEMENT, AORTIC VALVE , EDWARDS SAPIEN -- TAVR Valve:   S3 29mm -- Access: RTF -- IC: Dr. Penny Pia;  Surgeon: Christene Lye, MD;  Location: [facility] OR;  Service: Cardiothoracic;  Laterality: N/A;    RIGHT & LEFT HEART CATH POSSIBLE PCI N/A 05/26/2019     Procedure: RIGHT & LEFT HEART CATH POSSIBLE PCI;  Surgeon: Arlyss Queen, MD PhD;  Location: [facility];  Service: Cardiovascular;  Laterality: N/A;  [id number] scheduled with Maira in dr's office. OP. kr    TAVR PERCUTANEOUS FEMORAL  07/13/2019         TOTAL KNEE ARTHROPLASTY Left 2017    TOTAL KNEE ARTHROPLASTY Right 2005     Family History   Problem Relation Age of Onset    No known problems Mother     No known problems Father     No known problems Sister     No known problems Brother      Social History     Socioeconomic History    Marital status: Widowed   Tobacco Use    Smoking status: Never    Smokeless tobacco: Never   Vaping Use    Vaping status: Never Used   Substance and Sexual Activity    Alcohol use: Not Currently    Drug use: Never    Sexual activity: Not Currently     Partners: Female     Birth control/protection: None     Social Determinants of Health     Financial Resource Strain: Low Risk  (12/10/2020)    Overall Financial Resource Strain (CARDIA)     Difficulty of Paying Living Expenses: Not hard at all   Food Insecurity: No Food Insecurity (12/10/2020)    Hunger Vital Sign     Worried About Running Out of Food in the Last Year: Never true     Ran Out of Food in the Last Year: Never true   Transportation Needs: No Transportation Needs (12/10/2020)    PRAPARE - Therapist, art (Medical): No     Lack of Transportation (Non-Medical): No   Physical Activity: Insufficiently Active (12/10/2020)    Exercise Vital Sign     Days of Exercise per Week: 6 days     Minutes of Exercise per Session: 10 min   Stress: No Stress Concern Present (12/10/2020)    [employer] of Occupational Health - Occupational Stress Questionnaire     Feeling of Stress : Not at all   Social Connections: Unknown (12/10/2020)    Social Connection and Isolation Panel [NHANES]     Frequency of Communication with Friends and Family: Once a week     Frequency of Social Gatherings with Friends and  Family: Patient refused     Attends Religious Services: Patient refused  Active Member of Clubs or Organizations: No     Attends Banker Meetings: Never     Marital Status: Widowed   Intimate Partner Violence: Not At Risk (12/10/2020)    Humiliation, Afraid, Rape, and Kick questionnaire     Fear of Current or Ex-Partner: No     Emotionally Abused: No     Physically Abused: No     Sexually Abused: No   Housing Stability: Low Risk  (12/10/2020)    Housing Stability Vital Sign     Unable to Pay for Housing in the Last Year: No     Number of Places Lived in the Last Year: 1     Unstable Housing in the Last Year: No       REVIEW OF SYSTEMS  Review of Systems   Constitutional:  Negative for fever and weight loss.   HENT:  Positive for hearing loss. Negative for sinus pain, sore throat and tinnitus.    Eyes:  Negative for blurred vision.   Respiratory:  Positive for wheezing.    Cardiovascular:  Negative for chest pain and palpitations.   Gastrointestinal:  Negative for blood in stool, constipation, diarrhea, heartburn, nausea and vomiting.   Genitourinary:  Negative for hematuria.   Skin:  Negative for itching and rash.   Neurological:  Positive for dizziness and tingling. Negative for tremors, seizures and headaches.   Psychiatric/Behavioral:  Positive for memory loss. The patient is not nervous/anxious and does not have insomnia.         Objective:      Physical Exam  Vitals reviewed.   Constitutional:       Appearance: Normal appearance.      Comments: Frail    HENT:      Head: Normocephalic and atraumatic.   Eyes:      General: No scleral icterus.     Conjunctiva/sclera: Conjunctivae normal.   Cardiovascular:      Rate and Rhythm: Normal rate and regular rhythm.   Pulmonary:      Effort: Pulmonary effort is normal.   Abdominal:      Palpations: Abdomen is soft. There is no mass.   Musculoskeletal:         General: Swelling present.   Neurological:      Mental Status: He is alert and oriented to person,  place, and time.   Psychiatric:         Mood and Affect: Mood normal.         Behavior: Behavior normal.        Assessment & Plan:   Mr. Bleier is a 88 year old male who presents for bilateral PE. He also has anemia with thrombocytopenia.     Bilateral PE. I reviewed Mr. Cutright's history. I agree with Eliquis as treatment for PE and for unprovoked PE, which this appears to be the duration is typically 6 months. I do, however, worry about his increased weakness and potential for falling on this treatment. He is in physical therapy twice weekly and lives with son and daughter in law who can check in on him.     Risk factors for thrombosis were reviewed including obesity, immobility, inflammatory or autoimmune disorders, smoking, active malignancy and inherited or acquired hypercoagulable conditions (antiphospholipid antibody syndrome- APS).     I will repeat labs today. If counts sufficient, I would recommend restarting Eliquis at reduced dose of 2.5 mg PO bid.     2. Anemia. Mr. Schlarb has anemia and  cause of this is unclear. I would like to evaluate this further by ordering labs: CBC, CMP, iron profile, ferritin, reticulocytes, B12, MMA, folate, SPEP with immunofixation, immunoglobulins and free light chains.     3. Thrombocytopenia. This can be seen with medications, vitamin deficiency, autoimmune processes. I would like to evaluate by checking CBC along with B12, MMA, folate, copper and ANA.     He will follow up with me in 2-3 weeks to review the above lab results.     4. Disposition. All of Mr. Modica questions today were answered to his apparent satisfaction. He knows to call with any further questions or concerns.    Time spent 60 min    Answers submitted by the patient for this visit:  [facility] - Patient Intake Form (Submitted on 05/30/2021)  Chief Complaint: Symptoms  Unexpected weight gain: Yes  fatigue: Yes  Night sweats: No  Bleeding gums: No  Voice change: No  difficulty  breathing: No  Pain with breathing: No  Chronic cough: No  Bloody sputum: No  Snoring: Yes  Appetite change: No  Rectal bleeding: No  Urinary frequency: Yes  bladder incontinence: No  Change in urinary stream: No  Difficulty walking: Yes  numbness: No  confusion: Yes  mood changes: No  Depressed mood: No  Bruise or bleeds easily: No

## 2021-05-26 ENCOUNTER — Encounter (INDEPENDENT_AMBULATORY_CARE_PROVIDER_SITE_OTHER): Payer: Self-pay | Admitting: Cardiovascular Disease

## 2021-05-28 ENCOUNTER — Encounter (INDEPENDENT_AMBULATORY_CARE_PROVIDER_SITE_OTHER): Payer: Self-pay

## 2021-06-01 ENCOUNTER — Encounter (INDEPENDENT_AMBULATORY_CARE_PROVIDER_SITE_OTHER): Payer: Self-pay | Admitting: Internal Medicine

## 2021-06-01 ENCOUNTER — Encounter (INDEPENDENT_AMBULATORY_CARE_PROVIDER_SITE_OTHER): Payer: Self-pay | Admitting: Student in an Organized Health Care Education/Training Program

## 2021-06-02 ENCOUNTER — Telehealth (INDEPENDENT_AMBULATORY_CARE_PROVIDER_SITE_OTHER): Payer: Medicare Other | Admitting: Student in an Organized Health Care Education/Training Program

## 2021-06-02 ENCOUNTER — Encounter (INDEPENDENT_AMBULATORY_CARE_PROVIDER_SITE_OTHER): Payer: Self-pay | Admitting: Student in an Organized Health Care Education/Training Program

## 2021-06-02 VITALS — BP 133/52 | HR 62 | Temp 97.8°F | Wt 157.0 lb

## 2021-06-02 DIAGNOSIS — R600 Localized edema: Secondary | ICD-10-CM

## 2021-06-02 DIAGNOSIS — R29898 Other symptoms and signs involving the musculoskeletal system: Secondary | ICD-10-CM

## 2021-06-02 DIAGNOSIS — R5383 Other fatigue: Secondary | ICD-10-CM

## 2021-06-02 DIAGNOSIS — I5022 Chronic systolic (congestive) heart failure: Secondary | ICD-10-CM

## 2021-06-02 NOTE — Progress Notes (Signed)
Have you seen any specialists/other providers since your last visit with [place]?    No    Arm preference verified?   Yes, no preference    Health Maintenance Due   Topic Date Due    Medicare Annual Wellness Visit  Never done    Shingrix Vaccine 50+ (1) Never done    COVID-19 Vaccine (4 - Booster for Pfizer series) 02/19/2020

## 2021-06-02 NOTE — Progress Notes (Signed)
Patient ID: Rick [name]  is a 88 y.o.  male.     Verbal consent has been obtained from the patient to conduct a video visit encounter to minimize exposure to COVID-19:  Yes     Chief Complaint   Patient presents with    Fatigue    Extremity Weakness      Fatigue  This is a new problem. Episode onset: 2-3 weeks. The problem occurs daily. The problem has been unchanged. Pertinent negatives include no coughing. Associated symptoms comments: Leg swelling, bilateral LE weakness. The symptoms are aggravated by exertion. Treatments tried: Getting home health PT.     Problem   Chronic Systolic Chf (Congestive Heart Failure)    Last echo EF 25-30% 04/09/2021  Compliant with Entresto  Start furosemide 20 mg 2 weeks ago, now with LE edema, will resume furosemide    Wt Readings from Last 3 Encounters:   06/02/21 71.2 kg (157 lb)   04/30/21 64.2 kg (141 lb 9.6 oz)   04/16/21 68 kg (150 lb)           The following portions of the patient's history were reviewed and updated as appropriate: current medications, allergies, past family history, past medical history, past social history, past surgical history and problem list.     Review of Systems   Respiratory:  Negative for cough.         See HPI    OBJECTIVE     Physical exam limited due to pandemic/telemed visit    BP 133/52   Pulse 62   Temp 97.8 F (36.6 C)   Wt 71.2 kg (157 lb)   BMI 23.87 kg/m     Physical Exam    GENERAL : alert and in no distress   HEENT: ncat  LUNGS: unlabored breathing  PSYCH: Normal mood and affect, linear thought process, appropriate interactions, no SI/hallucinations      ASSESSMENT      Rick [name] is a 88 y.o. male with   1. Fatigue, unspecified type        2. Chronic systolic CHF (congestive heart failure)        3. Leg edema        4. Weakness of both lower extremities               PLAN         1. Fatigue, unspecified type  #2 contributing, resume furosemide 20 mg daily, follow-up in clinic next week    2. Chronic  systolic CHF (congestive heart failure)  LE edema, fatigue, resume furosemide, daily weights    3. Leg edema  See #2    4. Weakness of both lower extremities  Continue home health PT    Call if symptoms persist, worsen, or change.  Call with updates/questions/concerns.    F/u 1 week in clinic    I spent 25 mins during this encounter discussing/counseling/coordinating care for the following medical problem(s)     Medication list reviewed with patient and updated as indicated.  Risk & Benefits of any new medication(s) were explained to the patient who verbalized understanding & agreed to the treatment plan.  Patient given a printed copy of AVS. Patient verbalized understanding of instructions given.    This note was generated by the Epic EMR system/ Dragon speech recognition and may contain inherent errors or omissions not intended by the user. Grammatical errors, random word insertions, deletions and pronoun errors  are occasional consequences of this technology  due to software limitations. Not all errors are caught or corrected. If there are questions or concerns about the content of this note or information contained within the body of this dictation they should be addressed directly with the author for clarification

## 2021-06-04 ENCOUNTER — Ambulatory Visit
Payer: Medicare Other | Attending: Student in an Organized Health Care Education/Training Program | Admitting: Hematology & Oncology

## 2021-06-04 ENCOUNTER — Other Ambulatory Visit (FREE_STANDING_LABORATORY_FACILITY): Payer: Medicare Other

## 2021-06-04 ENCOUNTER — Encounter: Payer: Self-pay | Admitting: Hematology & Oncology

## 2021-06-04 VITALS — BP 123/56 | HR 53 | Temp 97.9°F | Ht 68.0 in | Wt 157.0 lb

## 2021-06-04 DIAGNOSIS — D539 Nutritional anemia, unspecified: Secondary | ICD-10-CM

## 2021-06-04 DIAGNOSIS — D696 Thrombocytopenia, unspecified: Secondary | ICD-10-CM | POA: Insufficient documentation

## 2021-06-04 DIAGNOSIS — D649 Anemia, unspecified: Secondary | ICD-10-CM

## 2021-06-04 DIAGNOSIS — I2609 Other pulmonary embolism with acute cor pulmonale: Secondary | ICD-10-CM | POA: Insufficient documentation

## 2021-06-04 LAB — IRON PROFILE
Iron Saturation: 37 % (ref 15–50)
Iron: 102 ug/dL (ref 40–160)
TIBC: 273 ug/dL (ref 261–462)
UIBC: 171 ug/dL (ref 126–382)

## 2021-06-04 LAB — CBC AND DIFFERENTIAL
Absolute NRBC: 0 10*3/uL (ref 0.00–0.00)
Basophils Absolute Automated: 0.01 10*3/uL (ref 0.00–0.08)
Basophils Automated: 0.4 %
Eosinophils Absolute Automated: 0.13 10*3/uL (ref 0.00–0.44)
Eosinophils Automated: 4.7 %
Hematocrit: 36.7 % — ABNORMAL LOW (ref 37.6–49.6)
Hgb: 12.2 g/dL — ABNORMAL LOW (ref 12.5–17.1)
Immature Granulocytes Absolute: 0 10*3/uL (ref 0.00–0.07)
Immature Granulocytes: 0 %
Instrument Absolute Neutrophil Count: 1.2 10*3/uL (ref 1.10–6.33)
Lymphocytes Absolute Automated: 1.02 10*3/uL (ref 0.42–3.22)
Lymphocytes Automated: 37.2 %
MCH: 34.7 pg — ABNORMAL HIGH (ref 25.1–33.5)
MCHC: 33.2 g/dL (ref 31.5–35.8)
MCV: 104.3 fL — ABNORMAL HIGH (ref 78.0–96.0)
MPV: 11.2 fL (ref 8.9–12.5)
Monocytes Absolute Automated: 0.38 10*3/uL (ref 0.21–0.85)
Monocytes: 13.9 %
Neutrophils Absolute: 1.2 10*3/uL (ref 1.10–6.33)
Neutrophils: 43.8 %
Nucleated RBC: 0 /100 WBC (ref 0.0–0.0)
Platelets: 90 10*3/uL — ABNORMAL LOW (ref 142–346)
RBC: 3.52 10*6/uL — ABNORMAL LOW (ref 4.20–5.90)
RDW: 14 % (ref 11–15)
WBC: 2.74 10*3/uL — ABNORMAL LOW (ref 3.10–9.50)

## 2021-06-04 LAB — COMPREHENSIVE METABOLIC PANEL
ALT: 16 U/L (ref 0–55)
AST (SGOT): 20 U/L (ref 5–41)
Albumin/Globulin Ratio: 0.9 (ref 0.9–2.2)
Albumin: 3.4 g/dL — ABNORMAL LOW (ref 3.5–5.0)
Alkaline Phosphatase: 69 U/L (ref 37–117)
Anion Gap: 6 (ref 5.0–15.0)
BUN: 25 mg/dL (ref 9.0–28.0)
Bilirubin, Total: 0.8 mg/dL (ref 0.2–1.2)
CO2: 29 mEq/L (ref 17–29)
Calcium: 9.9 mg/dL (ref 7.9–10.2)
Chloride: 107 mEq/L (ref 99–111)
Creatinine: 1.4 mg/dL (ref 0.5–1.5)
Globulin: 3.7 g/dL — ABNORMAL HIGH (ref 2.0–3.6)
Glucose: 124 mg/dL — ABNORMAL HIGH (ref 70–100)
Potassium: 4.1 mEq/L (ref 3.5–5.3)
Protein, Total: 7.1 g/dL (ref 6.0–8.3)
Sodium: 142 mEq/L (ref 135–145)

## 2021-06-04 LAB — RETICULOCYTES
Immature Retic Fract: 10.3 % (ref 1.2–15.6)
Reticulocyte Count Absolute: 0.0588 10*6/uL (ref 0.0220–0.1420)
Reticulocyte Count Automated: 1.7 % (ref 0.8–2.3)
Reticulocyte Hemoglobin: 35 pg (ref 28.4–40.2)

## 2021-06-04 LAB — FERRITIN: Ferritin: 77.8 ng/mL (ref 21.80–274.70)

## 2021-06-04 LAB — VITAMIN B12: Vitamin B-12: 630 pg/mL (ref 211–911)

## 2021-06-04 LAB — HEMOLYSIS INDEX
Hemolysis Index: 4 Index (ref 0–24)
Hemolysis Index: 8 Index (ref 0–24)

## 2021-06-04 LAB — FOLATE: Folate: 19 ng/mL

## 2021-06-04 LAB — IGG, IGA, IGM
Immunoglobulin A: 222 mg/dL (ref 101–645)
Immunoglobulin G: 1973 mg/dL — ABNORMAL HIGH (ref 540–1822)
Immunoglobulin M: 160 mg/dL (ref 22–293)

## 2021-06-04 LAB — GFR: EGFR: 57

## 2021-06-05 LAB — PROTEIN ELECTROPHORESIS, SERUM
Albumin %: 46.7 % (ref 46.6–62.6)
Albumin: 3 g/dL — ABNORMAL LOW (ref 3.4–4.8)
Alpha-1 Glob %: 3.3 % (ref 1.7–4.1)
Alpha-1 Globulin: 0.2 g/dL (ref 0.1–0.4)
Alpha-2 Glob %: 8.9 % (ref 8.9–14.9)
Alpha-2 Globulin: 0.6 g/dL — ABNORMAL LOW (ref 0.8–1.2)
Beta Glob %: 11.4 % (ref 10.9–18.9)
Beta Globulin: 0.7 g/dL (ref 0.6–1.2)
GM-Spike %: 15.6 % — ABNORMAL HIGH
GM-Spike: 1 g/dL — ABNORMAL HIGH
Gamma Globulin %: 29.7 % — ABNORMAL HIGH (ref 9.8–24.4)
Gamma Globulin: 1.9 g/dL — ABNORMAL HIGH (ref 0.6–1.7)
Protein, Total: 6.5 g/dL (ref 6.0–8.3)

## 2021-06-05 LAB — IFE REVIEW, SERUM

## 2021-06-05 LAB — SERUM PROTEIN ELECTROPHORESIS REVIEW

## 2021-06-05 LAB — IMMUNOFIXATION ELECTROPHORESIS

## 2021-06-05 LAB — ANA IFA W/REFLEX TO TITER/PATTERN/AB: ANA Screen: NEGATIVE

## 2021-06-07 LAB — METHYLMALONIC ACID, SERUM: Methylmalonic Acid: 239 nmol/L (ref 87–318)

## 2021-06-09 LAB — FREE KAPPA & LAMBDA LIGHT CHAINS PLUS RATIO, QUANTITATIVE, SERUM
Kappa Free Light Chain: 3.91 mg/dL — ABNORMAL HIGH (ref 0.3300–1.94)
Kappa Lambda FLC Ratio: 2.04 — ABNORMAL HIGH (ref 0.2600–1.65)
Lambda Free Light Chain: 1.92 mg/dL (ref 0.5700–2.63)

## 2021-06-09 LAB — BETA-2 MICROGLOBULIN: Beta-2-Microglobulin: 4.64 mg/L — ABNORMAL HIGH (ref <=2.51)

## 2021-06-11 ENCOUNTER — Encounter (INDEPENDENT_AMBULATORY_CARE_PROVIDER_SITE_OTHER): Payer: Self-pay | Admitting: Student in an Organized Health Care Education/Training Program

## 2021-06-11 ENCOUNTER — Ambulatory Visit (INDEPENDENT_AMBULATORY_CARE_PROVIDER_SITE_OTHER): Payer: Medicare Other | Admitting: Student in an Organized Health Care Education/Training Program

## 2021-06-11 VITALS — BP 135/52 | HR 53 | Temp 97.6°F | Resp 20 | Wt 147.0 lb

## 2021-06-11 DIAGNOSIS — I2609 Other pulmonary embolism with acute cor pulmonale: Secondary | ICD-10-CM

## 2021-06-11 DIAGNOSIS — I5022 Chronic systolic (congestive) heart failure: Secondary | ICD-10-CM

## 2021-06-11 DIAGNOSIS — L309 Dermatitis, unspecified: Secondary | ICD-10-CM

## 2021-06-11 MED ORDER — HYDROCORTISONE (PERIANAL) 2.5 % EX CREA
TOPICAL_CREAM | Freq: Two times a day (BID) | CUTANEOUS | 0 refills | Status: DC
Start: 2021-06-11 — End: 2021-07-10

## 2021-06-11 NOTE — Progress Notes (Signed)
Have you seen any specialists/other providers since your last visit with [place]?    Yes Hem / Onc    Arm preference verified?   Yes, RIGHT ARM    Health Maintenance Due   Topic Date Due    Medicare Annual Wellness Visit  Never done    Shingrix Vaccine 50+ (1) Never done    COVID-19 Vaccine (4 - Booster for Pfizer series) 02/19/2020

## 2021-06-11 NOTE — Progress Notes (Signed)
Patient ID: Rick [name]  is a 88 y.o.  male.     Chief Complaint   Patient presents with    Plulmonary Embolism    Bruising righ hand     1 week follow up      HPI    Problem   Acute Pulmonary Embolism With Acute Cor Pulmonale, Unspecified Pulmonary Embolism Type    Unprovoked PE, saw hematology, on [facility] with Eliquis 2.5 mg twice daily x 6mos    CTA 04/08/21: Segmental and subsegmental pulmonary emboli in the right lower lobe.  Mild straightening of the interventricular septum as well as reflux of  contrast into the intrahepatic IVC, suggesting right heart strain.     Chronic Systolic Chf (Congestive Heart Failure)    Last echo EF 25-30% 04/09/2021  Compliant with Entresto   Resumed furosemide 40mg  BID 1 wk ago, now back to baseline weight of 146-147lbs, LE edema significantly better, reduce furosemide to 40 mg daily, monitor daily weight, adjust furosemide accordingly, plan discussed with patient's son    Wt Readings from Last 3 Encounters:   06/11/21 66.7 kg (147 lb)   06/04/21 71.2 kg (157 lb)   06/02/21 71.2 kg (157 lb)          The following portions of the patient's history were reviewed and updated as appropriate: current medications, allergies, past family history, past medical history, past social history, past surgical history and problem list.     Review of Systems     See HPI    OBJECTIVE     BP 135/52 (BP Site: Right arm, Patient Position: Sitting, Cuff Size: Medium)   Pulse (!) 53   Temp 97.6 F (36.4 C) (Oral)   Resp 20   Wt 66.7 kg (147 lb)   SpO2 98%   BMI 22.35 kg/m     Physical Exam    GENERAL : alert and in no distress   SKIN: Perianal dermatitis  Head: ncat  Neck: supple, no mass/thyromegaly/LAD, FROM  Eyes: PERRLA, EOMI  Ears: external canals clear, TMs clear, hearing intact to finger rub  Nose: patent, no turbinate swelling/discharge  Throat: normal oropharynx, no exudates/swelling  LUNGS: clear, no w/r/r  HEART: RRR normal S1 and S2  without gallop , murmur or rub; mild LE  edema  ABDOMEN  BS(+)  soft, non- tender , non- distended,  without organomegaly   PSYCH: Normal mood and affect, linear thought process, appropriate interactions, no SI/hallucinations      ASSESSMENT      Rick [name] is a 88 y.o. male with   1. Chronic systolic CHF (congestive heart failure)        2. Dermatitis of perianal region  hydrocortisone (ANUSOL-HC) 2.5 % rectal cream      3. Acute pulmonary embolism with acute cor pulmonale, unspecified pulmonary embolism type               PLAN     1. Chronic systolic CHF (congestive heart failure)  -Improved, baseline weight, furosemide 40 mg daily, daily weights, follow-up with cardiology    2. Dermatitis of perianal region  - hydrocortisone (ANUSOL-HC) 2.5 % rectal cream; Place rectally 2 (two) times daily  Dispense: 30 g; Refill: 0    3. Acute pulmonary embolism with acute cor pulmonale, unspecified pulmonary embolism type  -Stable, AC with Eliquis 2.5 mg twice daily, has follow-up with hematology    Call if symptoms persist, worsen, or change.  Call with updates/questions/concerns.  F/u PRN    Medication list reviewed with patient and updated as indicated.  Risk & Benefits of any new medication(s) were explained to the patient who verbalized understanding & agreed to the treatment plan.  Patient given a printed copy of AVS. Patient verbalized understanding of instructions given.    This note was generated by the Epic EMR system/ Dragon speech recognition and may contain inherent errors or omissions not intended by the user. Grammatical errors, random word insertions, deletions and pronoun errors  are occasional consequences of this technology due to software limitations. Not all errors are caught or corrected. If there are questions or concerns about the content of this note or information contained within the body of this dictation they should be addressed directly with the author for clarification

## 2021-06-12 ENCOUNTER — Other Ambulatory Visit: Payer: Self-pay | Admitting: Hematology & Oncology

## 2021-06-12 ENCOUNTER — Telehealth: Payer: Self-pay

## 2021-06-12 ENCOUNTER — Telehealth: Payer: Self-pay | Admitting: Hematology & Oncology

## 2021-06-12 DIAGNOSIS — D649 Anemia, unspecified: Secondary | ICD-10-CM

## 2021-06-12 DIAGNOSIS — D472 Monoclonal gammopathy: Secondary | ICD-10-CM

## 2021-06-12 NOTE — Telephone Encounter (Signed)
Called to discuss concerns about possible multiple myeloma causing weakness, cytopenias and PE. Explained that I would have my office reach out and help schedule additional testing: MRI spine and pelvis  along with 24 hour urine test and bone marrow biopsy. I will see him back after testing to review results.

## 2021-06-12 NOTE — Progress Notes (Incomplete)
HISTORY OF PRESENT ILLNESS  Rick Raevon Broom is 88 y.o. male who presents     No Known Allergies  Current Outpatient Medications   Medication Sig Dispense Refill   . acetaminophen (TYLENOL) 325 MG tablet Take 2 tablets (650 mg total) by mouth every 4 (four) hours as needed for Pain     . apixaban (Eliquis) 2.5 MG Take 2.5 mg by mouth every 12 (twelve) hours     . docusate sodium (COLACE) 100 MG capsule Take 1 capsule (100 mg) by mouth 2 (two) times daily     . furosemide (LASIX) 40 MG tablet Take 40 mg by mouth 2 (two) times daily     . hydrocortisone (ANUSOL-HC) 2.5 % rectal cream Place rectally 2 (two) times daily 30 g 0   . memantine (NAMENDA) 10 MG tablet Take 2 tablets (20 mg total) by mouth every evening 180 tablet 0   . Multiple Vitamin (multivitamin) capsule Take 1 capsule by mouth daily     . rosuvastatin (CRESTOR) 20 MG tablet      . sacubitril-valsartan (ENTRESTO) 24-26 MG Tab per tablet Take 0.5 tablets by mouth 2 (two) times daily 90 tablet 3   . vitamin D (CHOLECALCIFEROL) 25 MCG (1000 UT) tablet Take 2 tablets (2,000 Units) by mouth Once every Monday, Wednesday and Friday morning       No current facility-administered medications for this visit.     Past Medical History:   Diagnosis Date   . Atrial enlargement, bilateral Echo result 04/04/19   . CAD (coronary artery disease) 05/28/19 Cath results.    Severe 3V CAD.  Patent LIMA to LAD, patent SVG to OM 1 and OM 2 of circumflex CA, & patent SVG to RCA.   Marland Kitchen Chronic combined systolic and diastolic heart failure 310/21 OV Dr. Penny Pia    EF=30%   . Dementia 04/2014   . Difficulty walking 06/2016    Post surgery of lt knee, rt femur   . Dyslipidemia 310/21 OV Dr. Penny Pia   . Dyspnea on exertion 02/2018   . HTN (hypertension) 310/21 OV Dr. Penny Pia   . Mild cognitive impairment 310/21 OV Dr. Penny Pia   . Primary cardiomyopathy 11/2017   . RBBB 07/10/19 EKg result   . S/P CABG (coronary artery bypass graft)      LIMA to LAD,  SVG to OM 1 & OM 2 of  circumflex CA, & SVG to RCA.     Past Surgical History:   Procedure Laterality Date   . CORONARY ARTERY BYPASS GRAFT  2003     approx year: LIMA to LAD,  SVG to OM 1 & OM 2 of circumflex CA, & SVG to RCA   . FRACTURE SURGERY Right 06/2016    femur   . HERNIORRHAPHY, INGUINAL Right 04/29/2021    Procedure: OPEN RIGHT HERNIORRHAPHY, INGUINAL WITH MESH;  Surgeon: Towanda Octave, MD;  Location: [facility];  Service: General;  Laterality: Right;   . PERCUTANEOUS VALVULOPLASTY - AORTIC N/A 07/12/2019    Procedure: TAVR #2;  Surgeon: Arlyss Queen, MD PhD;  Location: [facility];  Service: Cardiovascular;  Laterality: N/A;  23 HR OB   . REPLACEMENT, AORTIC VALVE , EDWARDS SAPIEN N/A 07/12/2019    Procedure: REPLACEMENT, AORTIC VALVE , EDWARDS SAPIEN -- TAVR Valve:   S3 29mm -- Access: RTF -- IC: Dr. Penny Pia;  Surgeon: Christene Lye, MD;  Location: [facility] OR;  Service: Cardiothoracic;  Laterality: N/A;   . RIGHT &  LEFT HEART CATH POSSIBLE PCI N/A 05/26/2019    Procedure: RIGHT & LEFT HEART CATH POSSIBLE PCI;  Surgeon: Arlyss Queen, MD PhD;  Location: [facility];  Service: Cardiovascular;  Laterality: N/A;  [id number] scheduled with Maira in dr's office. OP. kr   . TAVR PERCUTANEOUS FEMORAL  07/13/2019        . TOTAL KNEE ARTHROPLASTY Left 2017   . TOTAL KNEE ARTHROPLASTY Right 2005     Family History   Problem Relation Age of Onset   . No known problems Mother    . No known problems Father    . No known problems Sister    . No known problems Brother      Social History     Socioeconomic History   . Marital status: Widowed   Tobacco Use   . Smoking status: Never   . Smokeless tobacco: Never   Vaping Use   . Vaping status: Never Used   Substance and Sexual Activity   . Alcohol use: Not Currently   . Drug use: Never   . Sexual activity: Not Currently     Partners: Female     Birth control/protection: None     Social Determinants of Health     Financial Resource Strain: Low Risk  (05/30/2021)    Overall  Financial Resource Strain (CARDIA)    . Difficulty of Paying Living Expenses: Not hard at all   Food Insecurity: No Food Insecurity (05/30/2021)    Hunger Vital Sign    . Worried About Programme researcher, broadcasting/film/video in the Last Year: Never true    . Ran Out of Food in the Last Year: Never true   Transportation Needs: No Transportation Needs (05/30/2021)    PRAPARE - Transportation    . Lack of Transportation (Medical): No    . Lack of Transportation (Non-Medical): No   Physical Activity: Inactive (05/30/2021)    Exercise Vital Sign    . Days of Exercise per Week: 0 days    . Minutes of Exercise per Session: 0 min   Stress: No Stress Concern Present (05/30/2021)    [employer] of Occupational Health - Occupational Stress Questionnaire    . Feeling of Stress : Not at all   Social Connections: Socially Isolated (05/30/2021)    Social Connection and Isolation Panel [NHANES]    . Frequency of Communication with Friends and Family: Once a week    . Frequency of Social Gatherings with Friends and Family: Never    . Attends Religious Services: 1 to 4 times per year    . Active Member of Clubs or Organizations: No    . Attends Banker Meetings: Never    . Marital Status: Widowed   Intimate Partner Violence: Not At Risk (05/30/2021)    Humiliation, Afraid, Rape, and Kick questionnaire    . Fear of Current or Ex-Partner: No    . Emotionally Abused: No    . Physically Abused: No    . Sexually Abused: No   Housing Stability: Low Risk  (05/30/2021)    Housing Stability Vital Sign    . Unable to Pay for Housing in the Last Year: No    . Number of Places Lived in the Last Year: 1    . Unstable Housing in the Last Year: No       REVIEW OF SYSTEMS  ROS     Objective:   There were no vitals filed for this visit.  Physical Exam       Assessment & Plan:

## 2021-06-12 NOTE — Telephone Encounter (Signed)
Patient is scheduled for the following appointment with [facility] location:    MRI Lumbar Spine: 5/15 @ 11am  MRI Thoracic: 5/15 @ 11:45am  MRI Cervical:5/15 @ 1:30pm  MRI Pelvis: 5/15 @ 12:30 pm    Patient will pick up the 24 hr urine container at [facility] location [address] on the same day 5/15 @ 2:15pm.    Spoke to patient son and aware of new MRI and Urine appointment and preparations.

## 2021-06-12 NOTE — Telephone Encounter (Signed)
Patient Access: Please help schedule additional testing: MRI spine and pelvis (ordered) along with lab appointment for 24 hour urine test to be completed prior to patients f/u on 5/19 with Dr. Richardson Dopp.    RN requested scheduling of bone marrow biopsy to [facility]. CBC entered for this. (Will confirm linked labs)

## 2021-06-18 ENCOUNTER — Other Ambulatory Visit (FREE_STANDING_LABORATORY_FACILITY): Payer: Medicare Other

## 2021-06-18 DIAGNOSIS — D649 Anemia, unspecified: Secondary | ICD-10-CM

## 2021-06-18 LAB — CBC AND DIFFERENTIAL
Absolute NRBC: 0 10*3/uL (ref 0.00–0.00)
Basophils Absolute Automated: 0.01 10*3/uL (ref 0.00–0.08)
Basophils Automated: 0.3 %
Eosinophils Absolute Automated: 0.12 10*3/uL (ref 0.00–0.44)
Eosinophils Automated: 3.9 %
Hematocrit: 40.3 % (ref 37.6–49.6)
Hgb: 12.8 g/dL (ref 12.5–17.1)
Immature Granulocytes Absolute: 0.01 10*3/uL (ref 0.00–0.07)
Immature Granulocytes: 0.3 %
Instrument Absolute Neutrophil Count: 1.29 10*3/uL (ref 1.10–6.33)
Lymphocytes Absolute Automated: 1.33 10*3/uL (ref 0.42–3.22)
Lymphocytes Automated: 42.8 %
MCH: 34.1 pg — ABNORMAL HIGH (ref 25.1–33.5)
MCHC: 31.8 g/dL (ref 31.5–35.8)
MCV: 107.5 fL — ABNORMAL HIGH (ref 78.0–96.0)
MPV: 12.3 fL (ref 8.9–12.5)
Monocytes Absolute Automated: 0.35 10*3/uL (ref 0.21–0.85)
Monocytes: 11.3 %
Neutrophils Absolute: 1.29 10*3/uL (ref 1.10–6.33)
Neutrophils: 41.4 %
Nucleated RBC: 0 /100 WBC (ref 0.0–0.0)
Platelets: 93 10*3/uL — ABNORMAL LOW (ref 142–346)
RBC: 3.75 10*6/uL — ABNORMAL LOW (ref 4.20–5.90)
RDW: 13 % (ref 11–15)
WBC: 3.11 10*3/uL (ref 3.10–9.50)

## 2021-06-18 NOTE — Telephone Encounter (Signed)
RN LVM x2 trying to schedule patient for biopsy. IR is unable to reach him too. Currently 5/18 8am, 10am and 11am available.

## 2021-06-19 ENCOUNTER — Encounter (INDEPENDENT_AMBULATORY_CARE_PROVIDER_SITE_OTHER): Payer: Self-pay | Admitting: Cardiovascular Disease

## 2021-06-19 ENCOUNTER — Inpatient Hospital Stay (INDEPENDENT_AMBULATORY_CARE_PROVIDER_SITE_OTHER): Payer: Medicare Other | Admitting: Cardiovascular Disease

## 2021-06-19 ENCOUNTER — Ambulatory Visit (INDEPENDENT_AMBULATORY_CARE_PROVIDER_SITE_OTHER): Payer: Medicare Other | Admitting: Cardiovascular Disease

## 2021-06-19 VITALS — BP 100/47 | HR 52 | Ht 67.99 in | Wt 138.0 lb

## 2021-06-19 DIAGNOSIS — I2510 Atherosclerotic heart disease of native coronary artery without angina pectoris: Secondary | ICD-10-CM

## 2021-06-19 DIAGNOSIS — I252 Old myocardial infarction: Secondary | ICD-10-CM

## 2021-06-19 MED ORDER — EMPAGLIFLOZIN 10 MG PO TABS
10.0000 mg | ORAL_TABLET | Freq: Every day | ORAL | 3 refills | Status: DC
Start: 2021-06-19 — End: 2021-07-10

## 2021-06-19 MED ORDER — FUROSEMIDE 40 MG PO TABS
40.0000 mg | ORAL_TABLET | Freq: Every day | ORAL | 3 refills | Status: DC
Start: 2021-06-19 — End: 2021-07-06

## 2021-06-19 NOTE — Progress Notes (Signed)
[facility] Cardiology office visit      I had the pleasure of seeing Mr. Rick [name] today for cardiovascular follow up. He is a pleasant 88 y.o. with CAD s/p CABG 2003 , cardiomyopathy LVEF of 30% likely a mixed ischemic and nonischemic, hyperlipidemia, severe AS s/p TAVR July 2021 here for continued management    Patient with history of severe AS received  TAVR: /  29 mm  Edwards Sapien7/2021    CAD s/p CABG: In 2003  Currently no chest pain    He is accompanied by his son who writes independent history  Patient usually uses a wheelchair outside the house but inside the house able to use a cane and a walker and able to carry on ADLs  No chest discomfort or shortness of breath    Compliant with compression stockings  MEDICATIONS: He has a current medication list which includes the following prescription(s): acetaminophen - Take 2 tablets (650 mg total) by mouth every 4 (four) hours as needed for Pain, apixaban - Take 2.5 mg by mouth every 12 (twelve) hours, docusate sodium - Take 1 capsule (100 mg) by mouth 2 (two) times daily, hydrocortisone - Place rectally 2 (two) times daily, memantine - Take 2 tablets (20 mg total) by mouth every evening, multivitamin - Take 1 capsule by mouth daily, rosuvastatin - 10 mg, sacubitril-valsartan - Take 0.5 tablets by mouth 2 (two) times daily, vitamin d - Take 2 tablets (2,000 Units) by mouth Once every Monday, Wednesday and Friday morning, empagliflozin - Take 1 tablet (10 mg) by mouth daily, and furosemide - Take 1 tablet (40 mg) by mouth daily.    REVIEW OF SYSTEMS: All other systems reviewed and negative except as above.    PHYSICAL EXAMINATION  General Appearance: A well-appearing man in no acute distress.    Vital Signs: BP 100/47 (BP Site: Left arm, Patient Position: Sitting, Cuff Size: Medium)   Pulse (!) 52   Ht 1.727 m (5' 7.99")   Wt 62.6 kg (138 lb 0.1 oz)   SpO2 97%   BMI 20.99 kg/m       Neck:  Supple without jugular venous distention.   Chest:  Good air movement and respiratory effort  Bilaterally. Clear to auscultation. No wheezes, rales, or rhonchi   Cardiovascular: Normal S1 and S2 . 2/6 SM at LSB. No gallops or rub. PMI nondisplaced.   Extremities: Warm . No edema,  DP 2+ b/l   Skin: No rash,    Neuro:  Grossly intact.    Psych:  Normal mood and affect.         ECG:  sinus bradycardia ,, RBBB, occasional PVC    ECHOCARDIOGRAM:     08/2019:  * The left ventricle is mildly dilated.    * Left ventricular systolic function is severely decreased with an ejection  fraction by Biplane Method of Discs of  30 %.    * Left ventricular wall thickness is mildly increased.    * LVOT VTI is low consistent with low stroke volume (58mL) and cardiac  output (3.3L/min).  Stroke volume index 33mL/m2.    * The right ventricular cavity size is mildly dilated.    * Decreased right ventricular systolic function.  RV FAC 11.2%, RV S' 6.6  cm/s.  TAPSE 16mm.    * There is a known 29mm Sapien 3 TAVR valve in aortic position. It appears  well seated. DVI 0.3 and EOA 1.3cm2. These values are derived from a low  stroke volume (low LVOT VTI) and may reflect an underestimation of valve area  due to low output state.    * There is mild mitral regurgitation.    * There is mild to moderate tricuspid regurgitation.    * Moderate pulmonary hypertension with estimated right ventricular systolic  pressure of  46 mmHg.    * The ascending aorta is dilated.    * Compared to the prior study, Jul 13 2019, TAVR hemodynamics have changed.  Change in DVI and EOA may be underestimated and due to low output state.         Latest Reference Range & Units 06/18/21 15:37   WBC 3.10 - 9.50 x10 3/uL 3.11   Hemoglobin 12.5 - 17.1 g/dL 16.1   Hematocrit 09.6 - 49.6 % 40.3   Platelet Count 142 - 346 x10 3/uL 93 (L)   RBC 4.20 - 5.90 x10 6/uL 3.75 (L)   MCV 78.0 - 96.0 fL 107.5 (H)   MCH 25.1 - 33.5 pg 34.1 (H)   MCHC 31.5 - 35.8 g/dL 04.5   RDW 11 - 15 % 13   MPV 8.9 - 12.5 fL 12.3   Instrument Absolute  Neutrophil Count 1.10 - 6.33 x10 3/uL 1.29   Neutrophils None % 41.4   Lymphocytes Automated None % 42.8   Lymphocytes Absolute Automated 0.42 - 3.22 x10 3/uL 1.33   Monocytes None % 11.3   Monocytes Absolute Automated 0.21 - 0.85 x10 3/uL 0.35   Eosinophils Automated None % 3.9   Eosinophils Absolute Automated 0.00 - 0.44 x10 3/uL 0.12   Basophils Automated None % 0.3   Basophils Absolute Automated 0.00 - 0.08 x10 3/uL 0.01   Immature Granulocytes None % 0.3   Immature Granulocytes Absolute 0.00 - 0.07 x10 3/uL 0.01   Nucleated RBC 0.0 - 0.0 /100 WBC 0.0   Neutrophils Absolute 1.10 - 6.33 x10 3/uL 1.29   Nucleated RBC Absolute 0.00 - 0.00 x10 3/uL 0.00   (L): Data is abnormally low  (H): Data is abnormally high   Latest Reference Range & Units 06/04/21 14:00   Glucose 70 - 100 mg/dL 409 (H)   Hemolysis Index 0 - 24 Index  0 - 24 Index 8  4   BUN 9.0 - 28.0 mg/dL 81.1   Creatinine 0.5 - 1.5 mg/dL 1.4   Sodium 914 - 782 mEq/L 142   Potassium 3.5 - 5.3 mEq/L 4.1   Chloride 99 - 111 mEq/L 107   CO2 17 - 29 mEq/L 29   Calcium 7.9 - 10.2 mg/dL 9.9   Anion Gap 5.0 - 95.6  6.0   EGFR  57.0   AST 5 - 41 U/L 20   ALT 0 - 55 U/L 16   Alkaline Phosphatase 37 - 117 U/L 69   Albumin 3.5 - 5.0 g/dL 3.4 (L)   Protein Total 6.0 - 8.3 g/dL  6.0 - 8.3 g/dL 6.5  7.1   Globulin 2.0 - 3.6 g/dL 3.7 (H)   Albumin/Globulin Ratio 0.9 - 2.2  0.9   Bilirubin Total 0.2 - 1.2 mg/dL 0.8   Iron 40 - 213 ug/dL 086   TIBC 578 - 469 ug/dL 629   Iron Saturation 15 - 50 % 37   UIBC 126 - 382 ug/dL 528   Ferritin 41.32 - 274.70 ng/mL 77.80   Folate See below ng/mL 19.0   Vitamin B-12 211 - 911 pg/mL 630   Methylmalonic Acid 87 - 318 nmol/L 239  Beta-2-Microglobulin <=2.51 mg/L 4.64 (H)   Albumin % 46.6 - 62.6 % 46.7   Albumin 3.4 - 4.8 g/dL 3.0 (L)   Alpha-1 Glob % 1.7 - 4.1 % 3.3   Alpha-1 Globulin 0.1 - 0.4 g/dL 0.2   Alpha-2 Glob % 8.9 - 14.9 % 8.9   Alpha-2 Globulin 0.8 - 1.2 g/dL 0.6 (L)   Beta Glob % 65.7 - 18.9 % 11.4   Beta Globulin 0.6 - 1.2  g/dL 0.7   Gamma Globulin % 9.8 - 24.4 % 29.7 (H)   Gamma Globulin 0.6 - 1.7 g/dL 1.9 (H)   GM-Spike % None % 15.6 (H)   GM-Spike None g/dL 1.0 (H)   Kappa Free Light Chain 0.3300 - 1.94 mg/dL 8.46 (H)   Kappa Lambda FLC Ratio 0.2600 - 1.65  2.04 (H)   Lambda Free Light Chain 0.5700 - 2.63 mg/dL 9.62   (H): Data is abnormally high  (L): Data is abnormally low    ASSESSMENT AND PLAN:    He is a pleasant 88 y.o. with CAD s/p CABG 2003 , cardiomyopathy LVEF of 30% likely a mixed ischemic and nonischemic, hyperlipidemia, severe AS s/p TAVR July 2021 here for continued management    CAD s/p  CABG in 2003  cardiac cath / 5/2021with patent LIMA to LAD ,patent SVG to OM1 , patent SVG to OM2 , patent SVG to RCA   Currently with no chest pain  Not on aspirin because he is on Eliquis    Severe AS s/p TAVR 07/12/2019  Echo from 09/05/2019   DVI 0.3 and EOA 1.3cm2.   Continue to monitor  Repeat echo next year     CM  LVEF 30%  08/2019: Not decompensated  Stage C class III  Suspect mixed ischemic and nonischemic cardiomyopathy.  Given bradycardia would avoid beta-blockers.   He is on low-dose Entresto 24/26 1 tablet twice daily  Recommend addition of Jardiance 10 mg daily today  I will also reduce Lasix to 40 mg daily  He will need to be started on Aldactone at low-dose next office visit with a follow-up BMP      Hyperlipidemia tolerating statins.  Check lipids  Lipids reviewed and LDL at 86 from 01/01/2021  However no data with statins in a 88 year old  I gave them the option of stopping or continuing the statin he will continue statin    Patient will follow up with NP in 3 weeks and then subsequently with Dr. Glean Hess for GDMT for cardiomyopathy    All questions regarding cardiovascular diseases were answered during this encounter.      Orders Placed This Encounter   Procedures    ECG 12 lead (Normal)     Return in about 3 weeks (around 07/10/2021).      I have reviewed external documents/results as applicable under media, reviewed  records from epic including diagnostic testing and have independently interpreted  test performed by another provider as applicable           Bari Edward, MD

## 2021-06-19 NOTE — Telephone Encounter (Signed)
Patient is scheduled 5/30 @ 1:40pm for biopsy follow up with Dr. Richardson Dopp.

## 2021-06-19 NOTE — Telephone Encounter (Signed)
Patient confirmed for biopsy.     Patient Access: Please schedule patient for follow up review with Dr. Richardson Dopp the week of May 29 or soonest available. Please let patient know he is on the [facility] as well. (Patients son for contact)

## 2021-06-19 NOTE — Telephone Encounter (Signed)
Called patient but had to left mgs to wife for call back to assist scheduling follow up with Dr. Richardson Dopp

## 2021-06-19 NOTE — Patient Instructions (Addendum)
Reduce Lasix to 40 mg daily  Start Jardiance 10 mg daily  Next office visit start Aldactone 12.5 mg daily with follow-up BMP lipids reviewed and LDL at 86 from 01/01/2021

## 2021-06-20 ENCOUNTER — Encounter (INDEPENDENT_AMBULATORY_CARE_PROVIDER_SITE_OTHER): Payer: Self-pay | Admitting: Student in an Organized Health Care Education/Training Program

## 2021-06-30 ENCOUNTER — Ambulatory Visit
Admission: RE | Admit: 2021-06-30 | Discharge: 2021-06-30 | Disposition: A | Discharge status: Home / Self Care | Payer: Medicare Other | Source: Ambulatory Visit | Attending: Hematology & Oncology | Admitting: Hematology & Oncology

## 2021-06-30 ENCOUNTER — Other Ambulatory Visit (FREE_STANDING_LABORATORY_FACILITY): Payer: Medicare Other

## 2021-06-30 DIAGNOSIS — M48061 Spinal stenosis, lumbar region without neurogenic claudication: Secondary | ICD-10-CM | POA: Insufficient documentation

## 2021-06-30 DIAGNOSIS — M47812 Spondylosis without myelopathy or radiculopathy, cervical region: Secondary | ICD-10-CM | POA: Insufficient documentation

## 2021-06-30 DIAGNOSIS — M47814 Spondylosis without myelopathy or radiculopathy, thoracic region: Secondary | ICD-10-CM | POA: Insufficient documentation

## 2021-06-30 DIAGNOSIS — D472 Monoclonal gammopathy: Secondary | ICD-10-CM | POA: Insufficient documentation

## 2021-06-30 LAB — PROTEIN ELECTROPHORESIS, URINE: Protein, UR: 7 mg/dL (ref 1.0–14.0)

## 2021-06-30 MED ORDER — GADOTERATE MEGLUMINE 7.5 MMOL/15ML IV SOLN (CLARISCAN)
13.0000 mL | Freq: Once | INTRAVENOUS | Status: AC | PRN
Start: 2021-06-30 — End: 2021-06-30
  Administered 2021-06-30: 13 mL via INTRAVENOUS

## 2021-07-01 LAB — PROTEIN ELECTROPHORESIS, URINE
Alpha 1, Urine: 11.6 %
Alpha 2, Urine: 18.5 %
Beta, Urine: 14.5 %
Urine Albumin%: 32.4 %
Urine Gamma %: 23.1 %
Urine Pre Albumin%: 0 %

## 2021-07-01 LAB — URINE IMMUNOFIXATION

## 2021-07-02 ENCOUNTER — Other Ambulatory Visit (FREE_STANDING_LABORATORY_FACILITY): Payer: Medicare Other

## 2021-07-02 DIAGNOSIS — D472 Monoclonal gammopathy: Secondary | ICD-10-CM

## 2021-07-02 LAB — IFE REVIEW, URINE

## 2021-07-02 LAB — ELECTROPHORESIS REVIEW, URINE

## 2021-07-03 ENCOUNTER — Ambulatory Visit
Admission: RE | Admit: 2021-07-03 | Discharge: 2021-07-03 | Disposition: A | Discharge status: Home / Self Care | Payer: Medicare Other | Source: Ambulatory Visit | Attending: Hematology & Oncology | Admitting: Hematology & Oncology

## 2021-07-04 ENCOUNTER — Ambulatory Visit: Payer: Medicare Other | Admitting: Hematology & Oncology

## 2021-07-06 ENCOUNTER — Other Ambulatory Visit (INDEPENDENT_AMBULATORY_CARE_PROVIDER_SITE_OTHER): Payer: Self-pay | Admitting: Cardiovascular Disease

## 2021-07-06 ENCOUNTER — Other Ambulatory Visit (INDEPENDENT_AMBULATORY_CARE_PROVIDER_SITE_OTHER): Payer: Self-pay | Admitting: Student in an Organized Health Care Education/Training Program

## 2021-07-07 ENCOUNTER — Encounter (INDEPENDENT_AMBULATORY_CARE_PROVIDER_SITE_OTHER): Payer: Self-pay

## 2021-07-07 MED ORDER — FUROSEMIDE 40 MG PO TABS
40.0000 mg | ORAL_TABLET | Freq: Every day | ORAL | 3 refills | Status: DC
Start: 2021-07-07 — End: 2022-02-17

## 2021-07-07 NOTE — Telephone Encounter (Signed)
Last OV 06/19/21  F/u with Elease Hashimoto NP 07/10/21

## 2021-07-09 ENCOUNTER — Telehealth: Payer: Self-pay

## 2021-07-09 NOTE — Telephone Encounter (Signed)
LVM for son requesting call back about fathers appt on 5/30.  This appt is for scan/biopsy review, and pt's biopsy was postponed for 6/5.  Please confirm with pt's son if they would like to reschedule for a later date to review all results in one visit.

## 2021-07-09 NOTE — Progress Notes (Unsigned)
[facility] OFFICE VISIT      ASSESSMENT & PLAN:   No diagnosis found.       ***  *CAD s/p  CABG in 2003. cardiac cath / 5/2021with patent LIMA to LAD ,patent SVG to OM1 , patent SVG to OM2 , patent SVG to RCA. Currently with no chest pain. Not on aspirin because he is on Eliquis     *Severe AS s/p TAVR 07/12/2019. Echo from 09/05/2019   DVI 0.3 and EOA 1.3cm2. Continue to monitor  Repeat echo next year     * CM  LVEF 30%  08/2019: Not decompensated. AHA Stage C NYHA class III. Suspect mixed ischemic and nonischemic cardiomyopathy.  Given bradycardia would avoid beta-blockers. He is on low-dose Entresto 24/26 1 tablet twice daily. Recommend addition of Jardiance 10 mg daily today.   Reduced Lasix to 40 mg daily. He will need to be started on Aldactone at low-dose next office visit with a follow-up BMP     *Hyperlipidemia tolerating statins.  Check lipids. Lipids reviewed and LDL at 86 from 01/01/2021  However no data with statins in a 88 year old I gave them the option of stopping or continuing the statin he will continue statin     Patient will follow up with NP in 3 weeks and then subsequently with Dr. Glean Hessajan for GDMT for cardiomyopathy  *  *    No follow-ups on file.  Last seen 06/19/2021 by Dr. Bari EdwardArchana Reddy.  Under Dr. Andre LefortNarian Rajan's primary care.     HISTORY OF PRESENT ILLNESS:  I had the pleasure of seeing Mr. Rick [name] today in a cardiac follow up office visit for ***    Mr. Joselyn Glassmanhurmon Lawrence Shepler is a 88 y.o. male with a past medical history significant for coronary artery disease (s/p CABG 2003), cardiomyopathy (LVEF 30%, likely mixed ICM and NICM) hyperlipidemia, severe AS status post TAVR (09/05/2019).  Patient uses wheelchair outside the house but walker and cane inside and is able to carry out ADLs.    At today's visit Mr. Rick [name] is doing well. *** He denies chest pain, shortness of breath, edema, palpitations, presyncope, or syncope.       Cardiographics:  TTE(09/05/2019):  Mildly  dilated LV, LVEF 30%, mildly increased LVH,LVOT VTI is low consistent with low stroke volume (58mL) and cardiac output (3.3L/min).  Stroke volume index 2633mL/m2, mildly dilated RV cavity with decreased systolic function, RV FAC 11.2%, RV S' 6.6cm/s.  TAPSE 16mm. known 29mm Sapien 3 TAVR valve in aortic position. It appears well seated. DVI 0.3 and EOA 1.3cm2. These values are derived from a low stroke volume (low LVOT VTI) and may reflect an underestimation of valve area due to low output state.  Mild MR, mild-moderate TR, moderate pHTN, estimated RVSP 46 mmHg, dilated ascending aorta compared to previous study 07/13/2019, TAVR hemodynamics have changed, change in DVI NP OSA may be underestimated due to low output state.    ECG: ***    PMH:   Patient Active Problem List    Diagnosis Date Noted    Thrombocytopenia 04/29/2021    Acute renal failure with tubular necrosis 04/28/2021    Vascular dementia 04/28/2021    Incarcerated inguinal hernia 04/27/2021    Pulmonary embolism with acute cor pulmonale, unspecified chronicity, unspecified pulmonary embolism type 04/09/2021    Acute pulmonary embolism with acute cor pulmonale, unspecified pulmonary embolism type 04/08/2021    Chronic systolic CHF (congestive heart failure) 10/23/2020    Balance disorder 06/05/2020  Dementia without behavioral disturbance, unspecified dementia type 10/17/2019    Difficulty in walking, not elsewhere classified 10/09/2019    S/P TAVR (transcatheter aortic valve replacement) 08/16/2019    Aortic valve stenosis, etiology of cardiac valve disease unspecified 07/12/2019    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 03/09/2019    Dry cough 03/09/2019    Nonrheumatic aortic valve stenosis 12/23/2018    Vertebrogenic low back pain 09/28/2018    Memory deficits 05/23/2018    Coronary artery disease with history of myocardial infarction without history of CABG 05/23/2018        MEDICATIONS:     Current  Outpatient Medications   Medication Sig Dispense Refill    acetaminophen (TYLENOL) 325 MG tablet Take 2 tablets (650 mg total) by mouth every 4 (four) hours as needed for Pain      apixaban (Eliquis) 2.5 MG Take 2.5 mg by mouth every 12 (twelve) hours      docusate sodium (COLACE) 100 MG capsule Take 1 capsule (100 mg) by mouth 2 (two) times daily      empagliflozin (JARDIANCE) 10 MG tablet Take 1 tablet (10 mg) by mouth daily 90 tablet 3    furosemide (LASIX) 40 MG tablet Take 1 tablet (40 mg) by mouth daily 90 tablet 3    hydrocortisone (ANUSOL-HC) 2.5 % rectal cream Place rectally 2 (two) times daily 30 g 0    memantine (NAMENDA) 10 MG tablet Take 2 tablets (20 mg total) by mouth every evening 180 tablet 0    Multiple Vitamin (multivitamin) capsule Take 1 capsule by mouth daily      rosuvastatin (CRESTOR) 20 MG tablet 10 mg      sacubitril-valsartan (ENTRESTO) 24-26 MG Tab per tablet Take 0.5 tablets by mouth 2 (two) times daily 90 tablet 3    vitamin D (CHOLECALCIFEROL) 25 MCG (1000 UT) tablet Take 2 tablets (2,000 Units) by mouth Once every Monday, Wednesday and Friday morning       No current facility-administered medications for this visit.        SH:   Social History     Tobacco Use    Smoking status: Never    Smokeless tobacco: Never   Vaping Use    Vaping status: Never Used   Substance Use Topics    Alcohol use: Not Currently    Drug use: Never       REVIEW OF SYSTEMS: All other systems reviewed and negative except as above.    PHYSICAL EXAMINATION ***  General Appearance: A well-appearing male in no acute distress.   Vital Signs: There were no vitals taken for this visit.   HEENT: Sclera anicteric, conjunctiva without pallor, moist mucous membranes, normal dentition.   Neck: Supple without jugular venous distention. Thyroid nonpalpable. Normal carotid upstrokes without bruits.  Chest: Clear to auscultation bilaterally with good air movement and respiratory effort and no wheezes, rales, or  rhonchi  Cardiovascular: Normal S1 and physiologically split S2 without murmurs, gallops or rub. PMI of normal size and nondisplaced.   Abdomen: Soft, nontender. No organomegaly.  No pulsatile masses or bruits.    Extremities: Warm without edema. All peripheral pulses are full and equal.  Skin: No rash, xanthoma or xanthelasma.   Neuro: Alert and oriented x3. Grossly intact. Strength is symmetrical. Normal mood and affect.       Basic Metabolic Profile   Lab Results   Component Value Date    NA 142 06/04/2021  K 4.1 06/04/2021    BUN 25.0 06/04/2021    CREAT 1.4 06/04/2021    MG 1.9 04/11/2021    CA 9.9 06/04/2021    GLU 124 (H) 06/04/2021         Cardiac Biomarkers   Lab Results   Component Value Date    BNP 1,530 (H) 04/27/2021        CBC with Diff   Lab Results   Component Value Date    WBC 3.11 06/18/2021    HGB 12.8 06/18/2021    HCT 40.3 06/18/2021    PLT 93 (L) 06/18/2021         Cholesterol Panel   Lab Results   Component Value Date    CHOL 172 01/01/2021    HDL 73 01/01/2021    LDL 86 01/01/2021    TRIG 67 01/01/2021         Endocrine   Lab Results   Component Value Date    HGBA1C 4.6 07/10/2019         Coagulation Studies   Lab Results   Component Value Date    INR 1.2 (H) 07/10/2019    DDIMER 2.05 (H) 04/08/2021         ----------------------------  Phillis Haggis, AGNP-C  [facility]   Tel.: [phone], Fax: [phone]  [address], [place], [place] [address]  [address], [place], [place] [address]  _____________________________      Incident to service performed with physician present in the office. The physician's plan of care was implemented.

## 2021-07-10 ENCOUNTER — Ambulatory Visit (INDEPENDENT_AMBULATORY_CARE_PROVIDER_SITE_OTHER): Payer: Medicare Other | Admitting: Registered Nurse

## 2021-07-10 ENCOUNTER — Other Ambulatory Visit: Payer: Self-pay

## 2021-07-10 ENCOUNTER — Encounter (INDEPENDENT_AMBULATORY_CARE_PROVIDER_SITE_OTHER): Payer: Self-pay | Admitting: Registered Nurse

## 2021-07-10 VITALS — BP 99/62 | HR 53 | Resp 14 | Ht 68.0 in | Wt 158.2 lb

## 2021-07-10 DIAGNOSIS — I5020 Unspecified systolic (congestive) heart failure: Secondary | ICD-10-CM

## 2021-07-10 DIAGNOSIS — E785 Hyperlipidemia, unspecified: Secondary | ICD-10-CM

## 2021-07-10 DIAGNOSIS — I252 Old myocardial infarction: Secondary | ICD-10-CM

## 2021-07-10 DIAGNOSIS — I2510 Atherosclerotic heart disease of native coronary artery without angina pectoris: Secondary | ICD-10-CM

## 2021-07-10 DIAGNOSIS — Z952 Presence of prosthetic heart valve: Secondary | ICD-10-CM

## 2021-07-10 MED ORDER — EMPAGLIFLOZIN 10 MG PO TABS
10.0000 mg | ORAL_TABLET | Freq: Every day | ORAL | 3 refills | Status: DC
Start: 2021-07-10 — End: 2022-03-30

## 2021-07-10 NOTE — Progress Notes (Signed)
Ambulatory Care Management Progress Note    Care Plan Update     Goals:    1. Get home health PT/OT  2. Get help navigating post-hospital care  3. Get more information on management of CHF    Nurse Navigator spoke with pt's son and primary caregiver, Thumon II, to follow up. Son said pt is doing well. Pt followed up with cardiologist today. Already has scheduled follow ups. We reviewed medication changes and son verbalized them correctly.      We reviewed s/s of CHF exacerbation. The following was emailed to pt's son,    Managing Heart Failure Using "[employer]"- [place]    Pt gets very good support from his son who he lives with. Pt has been discharged from home health services.     Son said pt has no other CM need at this time. Resources have been previously provided by [facility]. Will graduate pt to maintenance. Requested for pt or son to call [facility] for any care management questions or needs. Pt's son verbalized understanding and appreciation for the support provided.       QMVHQ-ION Vivien Presto RN, BSN, CCM, MBA  Patient Care Navigator  Ambulatory Care Management  [facility]  [phone]

## 2021-07-10 NOTE — Patient Instructions (Addendum)
Continue furosemide (Lasix) 40 mg daily.  Start the Jardiance 10 mg daily.     Repeat BMP (non-fasting labs) in 2 weeks after starting the Jardiance.     Continue to weigh yourself daily upon arising after voiding/urinating. Call if you gain more than 2 pounds in 24 hours or 5 pounds in a week.

## 2021-07-13 ENCOUNTER — Other Ambulatory Visit (INDEPENDENT_AMBULATORY_CARE_PROVIDER_SITE_OTHER): Payer: Self-pay | Admitting: Student in an Organized Health Care Education/Training Program

## 2021-07-15 ENCOUNTER — Ambulatory Visit: Payer: Medicare Other | Admitting: Hematology & Oncology

## 2021-07-15 NOTE — Telephone Encounter (Signed)
MyChart message sent to patient advising Rx sent to pharmacy in April and will need to call pharmacy for the medication.

## 2021-07-16 ENCOUNTER — Encounter (INDEPENDENT_AMBULATORY_CARE_PROVIDER_SITE_OTHER): Payer: Self-pay

## 2021-07-16 ENCOUNTER — Encounter (INDEPENDENT_AMBULATORY_CARE_PROVIDER_SITE_OTHER): Payer: Self-pay | Admitting: Student in an Organized Health Care Education/Training Program

## 2021-07-17 ENCOUNTER — Encounter (INDEPENDENT_AMBULATORY_CARE_PROVIDER_SITE_OTHER): Payer: Self-pay | Admitting: Student in an Organized Health Care Education/Training Program

## 2021-07-18 ENCOUNTER — Other Ambulatory Visit: Payer: Self-pay

## 2021-07-18 DIAGNOSIS — D649 Anemia, unspecified: Secondary | ICD-10-CM

## 2021-07-21 ENCOUNTER — Ambulatory Visit: Payer: Self-pay

## 2021-07-21 ENCOUNTER — Ambulatory Visit
Admission: RE | Admit: 2021-07-21 | Discharge: 2021-07-21 | Disposition: A | Discharge status: Home / Self Care | Payer: Medicare Other | Source: Ambulatory Visit | Attending: Hematology & Oncology | Admitting: Hematology & Oncology

## 2021-07-21 ENCOUNTER — Ambulatory Visit (HOSPITAL_BASED_OUTPATIENT_CLINIC_OR_DEPARTMENT_OTHER)
Admission: RE | Admit: 2021-07-21 | Discharge: 2021-07-21 | Disposition: A | Discharge status: Home / Self Care | Payer: Medicare Other | Source: Ambulatory Visit | Attending: Hematology & Oncology | Admitting: Hematology & Oncology

## 2021-07-21 DIAGNOSIS — D472 Monoclonal gammopathy: Secondary | ICD-10-CM

## 2021-07-21 DIAGNOSIS — Z79899 Other long term (current) drug therapy: Secondary | ICD-10-CM | POA: Insufficient documentation

## 2021-07-21 DIAGNOSIS — D649 Anemia, unspecified: Secondary | ICD-10-CM

## 2021-07-21 DIAGNOSIS — Z7901 Long term (current) use of anticoagulants: Secondary | ICD-10-CM | POA: Insufficient documentation

## 2021-07-21 LAB — CBC AND DIFFERENTIAL
Absolute NRBC: 0 10*3/uL (ref 0.00–0.00)
Basophils Absolute Automated: 0.01 10*3/uL (ref 0.00–0.08)
Basophils Automated: 0.3 %
Eosinophils Absolute Automated: 0.1 10*3/uL (ref 0.00–0.44)
Eosinophils Automated: 3.1 %
Hematocrit: 37.8 % (ref 37.6–49.6)
Hgb: 12.4 g/dL — ABNORMAL LOW (ref 12.5–17.1)
Immature Granulocytes Absolute: 0.01 10*3/uL (ref 0.00–0.07)
Immature Granulocytes: 0.3 %
Instrument Absolute Neutrophil Count: 1.29 10*3/uL (ref 1.10–6.33)
Lymphocytes Absolute Automated: 1.42 10*3/uL (ref 0.42–3.22)
Lymphocytes Automated: 44.2 %
MCH: 34.7 pg — ABNORMAL HIGH (ref 25.1–33.5)
MCHC: 32.8 g/dL (ref 31.5–35.8)
MCV: 105.9 fL — ABNORMAL HIGH (ref 78.0–96.0)
MPV: 11.4 fL (ref 8.9–12.5)
Monocytes Absolute Automated: 0.38 10*3/uL (ref 0.21–0.85)
Monocytes: 11.8 %
Neutrophils Absolute: 1.29 10*3/uL (ref 1.10–6.33)
Neutrophils: 40.3 %
Nucleated RBC: 0 /100 WBC (ref 0.0–0.0)
Platelets: 83 10*3/uL — ABNORMAL LOW (ref 142–346)
RBC: 3.57 10*6/uL — ABNORMAL LOW (ref 4.20–5.90)
RDW: 13 % (ref 11–15)
WBC: 3.21 10*3/uL (ref 3.10–9.50)

## 2021-07-21 MED ORDER — MIDAZOLAM HCL 1 MG/ML IJ SOLN (WRAP)
INTRAMUSCULAR | Status: AC
Start: 2021-07-21 — End: ?
  Filled 2021-07-21: qty 4

## 2021-07-21 MED ORDER — MIDAZOLAM HCL 1 MG/ML IJ SOLN (WRAP)
INTRAMUSCULAR | Status: AC | PRN
Start: 2021-07-21 — End: 2021-07-21
  Administered 2021-07-21: .5 mg via INTRAVENOUS

## 2021-07-21 MED ORDER — FENTANYL CITRATE (PF) 50 MCG/ML IJ SOLN (WRAP)
INTRAMUSCULAR | Status: AC | PRN
Start: 2021-07-21 — End: 2021-07-21
  Administered 2021-07-21: 25 ug via INTRAVENOUS

## 2021-07-21 MED ORDER — LIDOCAINE 1% BUFFERED - CNR/OUTSOURCED
INTRAMUSCULAR | Status: AC | PRN
Start: 2021-07-21 — End: 2021-07-21
  Administered 2021-07-21: 11 mL via INTRADERMAL

## 2021-07-21 MED ORDER — FENTANYL CITRATE (PF) 50 MCG/ML IJ SOLN (WRAP)
INTRAMUSCULAR | Status: AC
Start: 2021-07-21 — End: ?
  Filled 2021-07-21: qty 4

## 2021-07-21 MED ORDER — LIDOCAINE 1% BUFFERED - CNR/OUTSOURCED
INTRAMUSCULAR | Status: AC
Start: 2021-07-21 — End: ?
  Filled 2021-07-21: qty 22

## 2021-07-21 MED ORDER — SODIUM CHLORIDE 0.9 % IV SOLN
INTRAVENOUS | Status: AC | PRN
Start: 2021-07-21 — End: 2021-07-21
  Administered 2021-07-21: 75 mL/h via INTRAVENOUS

## 2021-07-21 MED ORDER — HEPARIN SODIUM (PORCINE) 1000 UNIT/ML IJ SOLN
INTRAMUSCULAR | Status: AC
Start: 2021-07-21 — End: ?
  Filled 2021-07-21: qty 2

## 2021-07-21 NOTE — Progress Notes (Signed)
Patient came to IR department for CT Guided Bone Marrow Biopsy, consent signed with Dr. Lewis. Patient positioned on table prone, and placed on cardiac monitor and supplemental oxygen. Patient was sterile draped and prepped by IR Tech.     Patient tolerated procedure well. CT utilized for procedure. Patient vital signs stable on monitor, pain controlled with local anesthetic and IV sedation medication.     Sample and core collected and sent to hematology lab for further exam.    Dressing placed to R flank. No bleeding or oozing noted from site. Report called to Alex RN.     Patient transported back to IR department with all belongings.   Patient denies any other complaints. Post procedure instructions added to chart.

## 2021-07-21 NOTE — Brief Op Note (Signed)
BRIEF VIR PROCEDURE NOTE    Date Time: 07/21/21 1:32 PM    Patient Name:   Rick [name]    Date of Operation:   07/21/2021    Providers Performing:   * No surgeons listed *    Assistant (s): RT    Operative Procedure:   * No procedures listed *    Preoperative Diagnosis:   * No Diagnosis Codes entered *    Postoperative Diagnosis:   * No post-op diagnosis entered *    Anesthesia:   Local anesthesia with 1% lidocaine    Moderate sedation with fentanyl and midazolam    A safety timeout was performed.   Estimated Blood Loss:   Minimal    Radiation Dose:   Total DLP:  73.3 mGy-cm    Contrast:   None    Implants:   None    Findings:     Successful CT bone marrow biopsy.    Complications:   None                                                                    Brees Hounshell K. Melvyn Neth, MD    [facility]  [facility], Division of Vascular & Interventional Radiology  Office [phone]  [place] - [phone]  IFH: 3570 ILH: 8022 IFOH: [id number]    FO CT PROCEDURAL THIN LOG

## 2021-07-21 NOTE — Discharge Instructions (Signed)
Interventional Radiology  Discharge Instructions following Biopsy    You have had a CT guided bone biopsy performed by Dr. Marilu Favre on 07/21/2021.    A biopsy is a small sample of tissue or fluid taken from the body. Image-guided biopsy allows an Interventional Radiologist take a sample from an organ or mass without surgery. This sample can then be studied in a laboratory.     General Instructions:  Rest today post procedure.  Avoid aspirin and aspirin-like products (NSAIDS - Motrin, Ibuprofen, Advil, Aleve, Naproxen) for the next three days unless otherwise instructed by your doctor.  No heavy lifting (greater than 5 pounds) or straining for the next 72 hours (3 days).  No driving for 24 hours post procedure due to medication/sedation you may have received during your procedure.  Avoid alcohol for the next 24 hours after the procedure if you received sedation.    Site Care:   You may shower and remove the dressing tomorrow.    You may leave the site uncovered or replace with a new dry dressing or Band-Aid each day until healed.  Do NOT use creams, powders, lotions, or ointments at the site.   Bruising sometimes occurs at the site where the needle was inserted.    Call  the Interventional Radiologist if you observe:  Prolonged oozing of blood from the biopsy site  Pain at the site for more than 3 days    Increased pain or unrelieved pain  Dizziness or lightheadedness  Difficulty breathing or shortness of breath  Redness, warmth to touch, or discharge from biopsy site  Coughing up blood (more than 3 teaspoons of blood is significant)  Chest pain    If you have questions or concerns, please call an Interventional Radiologist:    Contact Numbers:    Regular business hours (8A-5P M-F):  Tyson Babinski [facility]: [phone]    After hours:  Answering service:  [phone]

## 2021-07-21 NOTE — Progress Notes (Signed)
Patient being discharged home. Vital signs stable, IV removed with catheter intact. No complaints of pain, nausea or vomiting. Patient tolerating PO. Dressing clean and intact, with no noted bleeding or drainage.     Discharge instructions reviewed with patient and family member over speaker phone. Both displayed verbal understanding of discharge instructions. No questions at this time.     Patient will be escorted to [facility] Lobby via wheelchair with copy of discharge instructions.

## 2021-07-21 NOTE — H&P (Signed)
BRIEF VIR H&P    Date Time: 07/21/21 12:36 PM    PROCEDURALIST COMMENTS BELOW:   88 yo M with monoclonal gammopathy presents for CT guided bone marrow biopsy.    INDICATIONS:   CT guided bone marrow biopsy.    Preoperative Diagnosis:   * No Diagnosis Codes entered *    PAST MEDICAL HISTORY:     Past Medical History:   Diagnosis Date    Atrial enlargement, bilateral Echo result 04/04/19    CAD (coronary artery disease) 05/28/19 Cath results.    Severe 3V CAD.  Patent LIMA to LAD, patent SVG to OM 1 and OM 2 of circumflex CA, & patent SVG to RCA.    Chronic combined systolic and diastolic heart failure 310/21 OV Dr. Penny Pia    EF=30%    Dementia 04/2014    Difficulty walking 06/2016    Post surgery of lt knee, rt femur    Dyslipidemia 310/21 OV Dr. Penny Pia    Dyspnea on exertion 02/2018    HTN (hypertension) 310/21 OV Dr. Penny Pia    Mild cognitive impairment 310/21 OV Dr. Penny Pia    Primary cardiomyopathy 11/2017    RBBB 07/10/19 EKg result    S/P CABG (coronary artery bypass graft)      LIMA to LAD,  SVG to OM 1 & OM 2 of circumflex CA, & SVG to RCA.       ALLERGIES:   No Known Allergies    PAST SURGICAL HISTORY     Past Surgical History:   Procedure Laterality Date    CORONARY ARTERY BYPASS GRAFT  2003     approx year: LIMA to LAD,  SVG to OM 1 & OM 2 of circumflex CA, & SVG to RCA    FRACTURE SURGERY Right 06/2016    femur    HERNIORRHAPHY, INGUINAL Right 04/29/2021    Procedure: OPEN RIGHT HERNIORRHAPHY, INGUINAL WITH MESH;  Surgeon: Towanda Octave, MD;  Location: [facility];  Service: General;  Laterality: Right;    PERCUTANEOUS VALVULOPLASTY - AORTIC N/A 07/12/2019    Procedure: TAVR #2;  Surgeon: Arlyss Queen, MD PhD;  Location: [facility];  Service: Cardiovascular;  Laterality: N/A;  23 HR OB    REPLACEMENT, AORTIC VALVE , EDWARDS SAPIEN N/A 07/12/2019    Procedure: REPLACEMENT, AORTIC VALVE , EDWARDS SAPIEN -- TAVR Valve:   S3 29mm -- Access: RTF -- IC: Dr. Penny Pia;  Surgeon: Christene Lye, MD;   Location: [facility] OR;  Service: Cardiothoracic;  Laterality: N/A;    RIGHT & LEFT HEART CATH POSSIBLE PCI N/A 05/26/2019    Procedure: RIGHT & LEFT HEART CATH POSSIBLE PCI;  Surgeon: Arlyss Queen, MD PhD;  Location: [facility];  Service: Cardiovascular;  Laterality: N/A;  [id number] scheduled with Maira in dr's office. OP. kr    TAVR PERCUTANEOUS FEMORAL  07/13/2019         TOTAL KNEE ARTHROPLASTY Left 2017    TOTAL KNEE ARTHROPLASTY Right 2005       REVIEW OF SYSTEMS REVIEWED:   YES  ( x )      CURRENT MEDICATION     Prior to Admission medications    Medication Sig Start Date End Date Taking? Authorizing Provider   apixaban (Eliquis) 2.5 MG Take 1 tablet (2.5 mg) by mouth every 12 (twelve) hours   Yes [provider]   docusate sodium (COLACE) 100 MG capsule Take 1 capsule (100 mg) by mouth 2 (two) times  daily   Yes [provider]   empagliflozin (JARDIANCE) 10 MG tablet Take 1 tablet (10 mg) by mouth daily 07/10/21  Yes Gilford RaidBronson, Patricia G, NP   furosemide (LASIX) 40 MG tablet Take 1 tablet (40 mg) by mouth daily 07/07/21  Yes Bari Edwardeddy, Archana, MD   memantine (NAMENDA) 10 MG tablet Take 2 tablets (20 mg total) by mouth every evening 10/17/19  Yes Erasmo ScorePham, Sean, MD   Multiple Vitamin (multivitamin) capsule Take 1 capsule by mouth daily   Yes [provider]   rosuvastatin (CRESTOR) 20 MG tablet 0.5 tablets (10 mg) 11/28/19  Yes [provider]   sacubitril-valsartan (ENTRESTO) 24-26 MG Tab per tablet Take 0.5 tablets by mouth 2 (two) times daily 05/21/21  Yes Erasmo ScorePham, Sean, MD   vitamin D (CHOLECALCIFEROL) 25 MCG (1000 UT) tablet Take 2 tablets (2,000 Units) by mouth Once every Monday, Wednesday and Friday morning   Yes [provider]   acetaminophen (TYLENOL) 325 MG tablet Take 2 tablets (650 mg total) by mouth every 4 (four) hours as needed for Pain 07/13/19   Lind CovertBowman, Cristin A, NP         SOCIAL HISTORY     Social History     Socioeconomic History    Marital status:  Widowed     Spouse name: Not on file    Number of children: Not on file    Years of education: Not on file    Highest education level: Not on file   Occupational History    Not on file   Tobacco Use    Smoking status: Never    Smokeless tobacco: Never   Vaping Use    Vaping status: Never Used   Substance and Sexual Activity    Alcohol use: Not Currently    Drug use: Never    Sexual activity: Not Currently     Partners: Female     Birth control/protection: None   Other Topics Concern    Not on file   Social History Narrative    Not on file     Social Determinants of Health     Financial Resource Strain: Low Risk  (05/30/2021)    Overall Financial Resource Strain (CARDIA)     Difficulty of Paying Living Expenses: Not hard at all   Food Insecurity: No Food Insecurity (05/30/2021)    Hunger Vital Sign     Worried About Running Out of Food in the Last Year: Never true     Ran Out of Food in the Last Year: Never true   Transportation Needs: No Transportation Needs (05/30/2021)    PRAPARE - Therapist, artTransportation     Lack of Transportation (Medical): No     Lack of Transportation (Non-Medical): No   Physical Activity: Inactive (05/30/2021)    Exercise Vital Sign     Days of Exercise per Week: 0 days     Minutes of Exercise per Session: 0 min   Stress: No Stress Concern Present (05/30/2021)    [employer] - Occupational Stress Questionnaire     Feeling of Stress : Not at all   Social Connections: Socially Isolated (05/30/2021)    Social Connection and Isolation Panel [NHANES]     Frequency of Communication with Friends and Family: Once a week     Frequency of Social Gatherings with Friends and Family: Never     Attends Religious Services: 1 to 4 times per year     Active Member of [employer]ubs  or Organizations: No     Attends Banker Meetings: Never     Marital Status: Widowed   Intimate Partner Violence: Not At Risk (05/30/2021)    Humiliation, Afraid, Rape, and Kick questionnaire     Fear of Current or  Ex-Partner: No     Emotionally Abused: No     Physically Abused: No     Sexually Abused: No   Housing Stability: Low Risk  (05/30/2021)    Housing Stability Vital Sign     Unable to Pay for Housing in the Last Year: No     Number of Places Lived in the Last Year: 1     Unstable Housing in the Last Year: No         PHYSICAL EXAM     AIRWAY CLASSIFICATION:    CLASS I   (  )     CLASS II  ( x )    CLASS III  (  )     CLASS IV  (  )    CARDIAC:   RRR  LUNGS:   NLBRA  ABDOMEN:   Soft, NT  NEURO:   AAO x3  OTHER:       LABS:     Recent Labs   Lab 07/21/21  1126   WBC 3.21   RBC 3.57*   Hgb 12.4*   Hematocrit 37.8           Invalid input(s):  CL, TP, BILIT        Invalid input(s): PTI    ASA PHYSICAL STATUS   (  )  ASA 1   HEALTHY PATIENT  ( x )  ASA 2   MILD SYSTEMIC ILLNESS  (  )  ASA 3   SYSTEMIC DISEASE, NOT INCAPACITATING  (  )  ASA 4   SEVERE SYSTEMIC DISEASE, IS CONSTANT THREAT TO  LIFE  (  )  ASA 5   MORIBUND CONDITION, NOT EXPECTED TO LIVE >24 HOURS            IRRESPECTIVE OF PROCEDURE  (  )  E           EMERGENCY PROCEDURE     PLANNED SEDATION:   Moderate sedation with fentanyl and midazolam      (  ) DNR Rescinded for precedure and d/w patient/family member who agree and understand.     CONCLUSION:   PATIENT HAS BEEN REASSESSED IMMEDIATELY PRIOR TO THE PROCEDURE AND IS AN APPROPRIATE CANDIDATE FOR THE PLANNED SEDATION AND   PROCEDURE.  RISKS, BENEFITS AND ALTERNATIVES TO THE PLANNED PROCEDURE AND SEDATION HAVE BEEN EXPLAINED TO THE PATIENT   OR GUARDIAN/FAMILY THOROUGHLY. MAJOR RISKS RE INFECTION, BLEEDING, INTERNAL ORGAN INJURY, AND RECURRENCE WERE DISCUSSED.    (x)  YES  (  )  EMERGENCY CONSENT       Alsace Dowd K. Melvyn Neth, MD    [facility]  [facility], Division of Vascular & Interventional Radiology  [phone]  [place] - [phone]  [phone] IFOH--[id number]

## 2021-07-22 ENCOUNTER — Encounter (INDEPENDENT_AMBULATORY_CARE_PROVIDER_SITE_OTHER): Payer: Self-pay | Admitting: Internal Medicine

## 2021-07-22 LAB — LAB USE ONLY - HISTORICAL NON-GYN MEDICAL CYTOLOGY

## 2021-07-23 ENCOUNTER — Other Ambulatory Visit (INDEPENDENT_AMBULATORY_CARE_PROVIDER_SITE_OTHER): Payer: Self-pay | Admitting: Registered Nurse

## 2021-07-23 DIAGNOSIS — I5020 Unspecified systolic (congestive) heart failure: Secondary | ICD-10-CM

## 2021-07-23 DIAGNOSIS — Z952 Presence of prosthetic heart valve: Secondary | ICD-10-CM

## 2021-07-23 DIAGNOSIS — E785 Hyperlipidemia, unspecified: Secondary | ICD-10-CM

## 2021-07-23 NOTE — Telephone Encounter (Signed)
Completed Process Reviewed Notes   [x]   Verified med on pt chart.     [x]   2.  Reviewed patient allergies     [x]   3.  Review last encounters since OV  same dose after hospital discharge   [x]   4.  Determine if pt has f/u as instructed or needs OV  appt with Dr. Glean Hess in July   [x]   5.  If OV required, pend 30 day supply of medication     [x]   6.  Review labs are up to date. BP was mentioned but not ordered

## 2021-07-24 LAB — LAB USE ONLY - HISTORICAL SURGICAL PATHOLOGY

## 2021-07-28 MED ORDER — ROSUVASTATIN CALCIUM 10 MG PO TABS
10.0000 mg | ORAL_TABLET | Freq: Every day | ORAL | 0 refills | Status: DC
Start: 2021-07-28 — End: 2022-08-24

## 2021-07-30 ENCOUNTER — Encounter (INDEPENDENT_AMBULATORY_CARE_PROVIDER_SITE_OTHER): Payer: Self-pay | Admitting: Cardiovascular Disease

## 2021-07-31 ENCOUNTER — Other Ambulatory Visit: Payer: Medicare Other

## 2021-07-31 LAB — MISCELLANEOUS MAYO TEST

## 2021-07-31 LAB — BONE MARROW CHROMOSOME ANALYSIS SOFT

## 2021-08-01 LAB — URINE KAPPA/LAMBDA LIGHT CHAINS 24 HR
Urine Measured Kappa Chains: <1 mg/dL (ref <2.00)
Urine Measured Lambda Chain: <1 mg/dL (ref <2.00)
Urine Volume:: 500

## 2021-08-04 NOTE — Progress Notes (Signed)
HISTORY OF PRESENT ILLNESS  Rick [name] is 88 y.o. male who comes in for an initial consultation. He has a history of atherosclerosis s/p CABG, aortic stenosis s/p TAVR, vascular dementia, acute renal insufficiency, low back pain and balance disorder. He presented to me for a new finding of bilateral PE on 04/08/21. CTA showed segmental and subsegmental pulmonary emboli in the right lower lobe. There was also mild straightening of the interventricular septum as well as reflux of contrast into the intrahepatic IVC, suggesting right heart strain. Bilateral dopplers did not show evidence of clot. Echo showed severely reduced EF of 25-30%. CT of the abdomen and pelvis in 04/2021 showed small bowel obstruction with transition point at site of new right-sided inguinal hernia. He was started on Eliquis for his clot.      Labs show anemia with hemoglobin of 11.8 and thrombocytopenia with platelets of 70-80. He has mildly elevated creatinine of 1.2-1.5.     Since his last follow up with me he was found to have a monoclonal gammopathy (IgG lambda). Work up for this with MRI (c,t,l and pelvic spine) as well as 24 hour urine and bone marrow biopsy were done to further evaluate.     Today Rick [name] presents in a wheelchair with his son. He lives with his son. He feels as though he is getting stronger and is tolerating Eliquis 2.5 mg bid well. He denies fever, chills, night sweats, back or bone pain. He denies bleeding. His biggest complaint is urinary frequency which is likely related to lasix.      No Known Allergies  Current Outpatient Medications   Medication Sig Dispense Refill    acetaminophen (TYLENOL) 325 MG tablet Take 2 tablets (650 mg total) by mouth every 4 (four) hours as needed for Pain      apixaban (Eliquis) 2.5 MG Take 1 tablet (2.5 mg) by mouth every 12 (twelve) hours      docusate sodium (COLACE) 100 MG capsule Take 1 capsule (100 mg) by mouth 2 (two) times daily      empagliflozin (JARDIANCE) 10  MG tablet Take 1 tablet (10 mg) by mouth daily 90 tablet 3    furosemide (LASIX) 40 MG tablet Take 1 tablet (40 mg) by mouth daily 90 tablet 3    memantine (NAMENDA) 10 MG tablet Take 2 tablets (20 mg total) by mouth every evening 180 tablet 0    Multiple Vitamin (multivitamin) capsule Take 1 capsule by mouth daily      rosuvastatin (CRESTOR) 10 MG tablet Take 1 tablet (10 mg) by mouth daily 90 tablet 0    sacubitril-valsartan (ENTRESTO) 24-26 MG Tab per tablet Take 0.5 tablets by mouth 2 (two) times daily 90 tablet 3    vitamin D (CHOLECALCIFEROL) 25 MCG (1000 UT) tablet Take 2 tablets (2,000 Units) by mouth Once every Monday, Wednesday and Friday morning       No current facility-administered medications for this visit.     Past Medical History:   Diagnosis Date    Atrial enlargement, bilateral Echo result 04/04/19    CAD (coronary artery disease) 05/28/19 Cath results.    Severe 3V CAD.  Patent LIMA to LAD, patent SVG to OM 1 and OM 2 of circumflex CA, & patent SVG to RCA.    Chronic combined systolic and diastolic heart failure 310/21 OV Dr. Penny Pia    EF=30%    Dementia 04/2014    Difficulty walking 06/2016    Post surgery of lt knee,  rt femur    Dyslipidemia 310/21 OV Dr. Penny Pia    Dyspnea on exertion 02/2018    HTN (hypertension) 310/21 OV Dr. Penny Pia    Mild cognitive impairment 310/21 OV Dr. Penny Pia    Primary cardiomyopathy 11/2017    RBBB 07/10/19 EKg result    S/P CABG (coronary artery bypass graft)      LIMA to LAD,  SVG to OM 1 & OM 2 of circumflex CA, & SVG to RCA.     Past Surgical History:   Procedure Laterality Date    CORONARY ARTERY BYPASS GRAFT  2003     approx year: LIMA to LAD,  SVG to OM 1 & OM 2 of circumflex CA, & SVG to RCA    FRACTURE SURGERY Right 06/2016    femur    HERNIORRHAPHY, INGUINAL Right 04/29/2021    Procedure: OPEN RIGHT HERNIORRHAPHY, INGUINAL WITH MESH;  Surgeon: Towanda Octave, MD;  Location: [facility];  Service: General;  Laterality: Right;    PERCUTANEOUS VALVULOPLASTY -  AORTIC N/A 07/12/2019    Procedure: TAVR #2;  Surgeon: Arlyss Queen, MD PhD;  Location: [facility];  Service: Cardiovascular;  Laterality: N/A;  23 HR OB    REPLACEMENT, AORTIC VALVE , EDWARDS SAPIEN N/A 07/12/2019    Procedure: REPLACEMENT, AORTIC VALVE , EDWARDS SAPIEN -- TAVR Valve:   S3 29mm -- Access: RTF -- IC: Dr. Penny Pia;  Surgeon: Christene Lye, MD;  Location: [facility];  Service: Cardiothoracic;  Laterality: N/A;    RIGHT & LEFT HEART CATH POSSIBLE PCI N/A 05/26/2019    Procedure: RIGHT & LEFT HEART CATH POSSIBLE PCI;  Surgeon: Arlyss Queen, MD PhD;  Location: [facility];  Service: Cardiovascular;  Laterality: N/A;  [id number] scheduled with Maira in dr's office. OP. kr    TAVR PERCUTANEOUS FEMORAL  07/13/2019         TOTAL KNEE ARTHROPLASTY Left 2017    TOTAL KNEE ARTHROPLASTY Right 2005     Family History   Problem Relation Age of Onset    No known problems Mother     No known problems Father     No known problems Sister     No known problems Brother      Social History     Socioeconomic History    Marital status: Widowed   Tobacco Use    Smoking status: Never    Smokeless tobacco: Never   Vaping Use    Vaping status: Never Used   Substance and Sexual Activity    Alcohol use: Not Currently    Drug use: Never    Sexual activity: Not Currently     Partners: Female     Birth control/protection: None     Social Determinants of Health     Financial Resource Strain: Low Risk  (05/30/2021)    Overall Financial Resource Strain (CARDIA)     Difficulty of Paying Living Expenses: Not hard at all   Food Insecurity: No Food Insecurity (05/30/2021)    Hunger Vital Sign     Worried About Running Out of Food in the Last Year: Never true     Ran Out of Food in the Last Year: Never true   Transportation Needs: No Transportation Needs (05/30/2021)    PRAPARE - Therapist, art (Medical): No     Lack of Transportation (Non-Medical): No   Physical Activity: Inactive (05/30/2021)     Exercise Vital Sign  Days of Exercise per Week: 0 days     Minutes of Exercise per Session: 0 min   Stress: No Stress Concern Present (05/30/2021)    [employer] of Occupational Health - Occupational Stress Questionnaire     Feeling of Stress : Not at all   Social Connections: Socially Isolated (05/30/2021)    Social Connection and Isolation Panel [NHANES]     Frequency of Communication with Friends and Family: Once a week     Frequency of Social Gatherings with Friends and Family: Never     Attends Religious Services: 1 to 4 times per year     Active Member of [employer] or Organizations: No     Attends Banker Meetings: Never     Marital Status: Widowed   Intimate Partner Violence: Not At Risk (05/30/2021)    Humiliation, Afraid, Rape, and Kick questionnaire     Fear of Current or Ex-Partner: No     Emotionally Abused: No     Physically Abused: No     Sexually Abused: No   Housing Stability: Low Risk  (05/30/2021)    Housing Stability Vital Sign     Unable to Pay for Housing in the Last Year: No     Number of Places Lived in the Last Year: 1     Unstable Housing in the Last Year: No       REVIEW OF SYSTEMS  Review of Systems   Constitutional:  Negative for chills, fever and malaise/fatigue.   Respiratory:  Negative for shortness of breath.    Cardiovascular:  Negative for chest pain.   Gastrointestinal:  Negative for abdominal pain.   Genitourinary:  Positive for frequency. Negative for dysuria.   Musculoskeletal:  Negative for back pain and falls.   Neurological:  Positive for weakness.        Objective:      Physical Exam  Vitals reviewed.   Constitutional:       Appearance: Normal appearance.      Comments: In a wheelchair   HENT:      Head: Normocephalic and atraumatic.   Eyes:      Conjunctiva/sclera: Conjunctivae normal.   Pulmonary:      Effort: Pulmonary effort is normal.   Musculoskeletal:         General: No swelling.   Neurological:      Mental Status: He is alert and oriented to person,  place, and time.   Psychiatric:         Mood and Affect: Mood normal.         Behavior: Behavior normal.        Assessment & Plan:   Mr. Errington is a 88 year old male who presents for bilateral PE. He also has anemia with thrombocytopenia.     In workup for anemia he was found to have monoclonal gammopathy (IgG lambda) with M-spike of 1 gm/dL. 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      Bilateral PE. Mr. Brandy is doing well on 2.5 mg of Eliquis bid. He will continue with this. Last hemoglobin was 12.4 with platelets of 83,000 and no bleeding. He knows to hold on to support when ambulating to prevent falls.      2. Anemia. Mr. Tibbs has monoclonal gammopathy (IgG lambda). Hemoglobin has improved to 12.4 (was down to 11.3.). No evidence of iron deficiency or B12 deficiency.     3. Smoldering myeloma. I  reviewed results from MRI testing (06/2021) as well as urine testing and bone marrow biopsy (07/31/21). He does have 8-10 % clonal plasma cells in the marrow with t(4;14) which is high risk. MRI does not show lytic bone lesions however and hemoglobin has improved. There is no evidence of renal failure or hypercalcemia.     Mr. Grudzien has smoldering myeloma and needs close follow up but no treatment at this time. Progression to active myeloma is about 10% per year. He will follow up with me in 3 months with repeat labs a week prior.     4. Thrombocytopenia. This is stable and may be due to medications- will continue to follow.      5. Disposition. All of Mr. Graefe questions today were answered to his apparent satisfaction. He knows to call with any further questions or concerns.     Time spent 31 min

## 2021-08-09 ENCOUNTER — Encounter (INDEPENDENT_AMBULATORY_CARE_PROVIDER_SITE_OTHER): Payer: Self-pay | Admitting: Student in an Organized Health Care Education/Training Program

## 2021-08-10 ENCOUNTER — Encounter: Payer: Self-pay | Admitting: Hematology & Oncology

## 2021-08-10 ENCOUNTER — Encounter (INDEPENDENT_AMBULATORY_CARE_PROVIDER_SITE_OTHER): Payer: Self-pay | Admitting: Student in an Organized Health Care Education/Training Program

## 2021-08-11 ENCOUNTER — Telehealth: Payer: Self-pay

## 2021-08-11 ENCOUNTER — Ambulatory Visit (INDEPENDENT_AMBULATORY_CARE_PROVIDER_SITE_OTHER): Payer: Medicare Other | Admitting: Family Medicine

## 2021-08-11 ENCOUNTER — Encounter (INDEPENDENT_AMBULATORY_CARE_PROVIDER_SITE_OTHER): Payer: Self-pay | Admitting: Family Medicine

## 2021-08-11 VITALS — BP 125/56 | HR 48 | Temp 98.5°F | Resp 12

## 2021-08-11 DIAGNOSIS — K644 Residual hemorrhoidal skin tags: Secondary | ICD-10-CM

## 2021-08-11 MED ORDER — BENEFIBER DRINK MIX PO PACK
1.0000 | PACK | Freq: Every day | ORAL | 5 refills | Status: DC
Start: 2021-08-11 — End: 2022-02-17

## 2021-08-11 MED ORDER — HYDROCORTISONE (PERIANAL) 2.5 % EX CREA
TOPICAL_CREAM | Freq: Two times a day (BID) | CUTANEOUS | 0 refills | Status: AC
Start: 2021-08-11 — End: 2021-09-10

## 2021-08-11 NOTE — Progress Notes (Signed)
Have you seen any specialists/other providers since your last visit with [place]?    No    Arm preference verified?   Yes, LEFT ARM    Health Maintenance Due   Topic Date Due    Medicare Annual Wellness Visit  Never done    Shingrix Vaccine 50+ (1) Never done    COVID-19 Vaccine (4 - Pfizer series) 02/19/2020

## 2021-08-11 NOTE — Telephone Encounter (Signed)
Pt's son called stating that they have located the source of the bleeding near the base of father's testicles. He has an appt to see Dr Ilean China today at 1115.    Call Back  [phone]

## 2021-08-11 NOTE — Progress Notes (Signed)
Subjective:      Patient ID: Rick [name] is a 88 y.o. male     Chief Complaint   Patient presents with   . Bleeding/Bruising     In crouch  area         HPI   Comes in for evaluation of blood spots that he noticed on his undergarments yesterday morning.  No frank blood in stool or bleeding. His son reports that there was some present today but not as much.  Patient denies any other symptoms. They reports seeing a wound behind the patient's left testicle that may be bleeding.     The following sections were reviewed this encounter by the provider:        Review of Systems   Constitutional:  Negative for activity change, appetite change, chills, fatigue and fever.   HENT:  Negative for congestion and postnasal drip.    Respiratory:  Negative for shortness of breath and wheezing.    Cardiovascular:  Negative for chest pain and palpitations.   Gastrointestinal:  Negative for abdominal distention and abdominal pain.   Genitourinary:  Negative for difficulty urinating and dysuria.   Musculoskeletal:  Negative for arthralgias and back pain.   Neurological:  Negative for dizziness and numbness.          BP 125/56 (BP Site: Left arm, Patient Position: Sitting, Cuff Size: Medium)   Pulse (!) 48   Temp 98.5 F (36.9 C) (Oral)   Resp 12     Objective:     Physical Exam  Vitals and nursing note reviewed.   Constitutional:       General: He is not in acute distress.     Appearance: Normal appearance. He is not ill-appearing.   HENT:      Head: Normocephalic and atraumatic.      Right Ear: External ear normal.      Left Ear: External ear normal.      Nose: Nose normal. No congestion.      Mouth/Throat:      Mouth: Mucous membranes are moist.   Eyes:      Pupils: Pupils are equal, round, and reactive to light.   Genitourinary:     Comments: Examination performed with the patient's son in the room.  Examination of the perineum reveals no wounds or source of bleeding.  External hemorrhoids were present  Neurological:       General: No focal deficit present.      Mental Status: He is alert and oriented to person, place, and time. Mental status is at baseline.   Psychiatric:         Mood and Affect: Mood normal.        Assessment:     1. External hemorrhoids  - hydrocortisone (ANUSOL-HC) 2.5 % rectal cream; Place rectally 2 (two) times daily  Dispense: 30 g; Refill: 0  - Wheat Dextrin (Benefiber Drink Mix) Pack; Take 1 Package by mouth daily  Dispense: 28 each; Refill: 5        Plan:     Bleeding likely secondary to external hemorrhoids.  Discussed sitz baths, fiber supplement and use of anusol cream.  If patient develops symptoms or frank bleeding occurs recommended that they contact the office.  All questions answered.    Lonell Face II, DO    The following activities were performed on the date of service:  preparing to see the patient: - chart review   obtaining and/or reviewing the separately obtained  history  performing a medically appropriate examination and/or evaluation   documenting clinical information in the electronic or other health records    Total time spent performing activities on date of service:  32  minutes

## 2021-08-11 NOTE — Progress Notes (Signed)
Had apportionment with Dr Coralee North.

## 2021-08-17 ENCOUNTER — Encounter (INDEPENDENT_AMBULATORY_CARE_PROVIDER_SITE_OTHER): Payer: Self-pay | Admitting: Cardiovascular Disease

## 2021-08-18 ENCOUNTER — Other Ambulatory Visit (INDEPENDENT_AMBULATORY_CARE_PROVIDER_SITE_OTHER): Payer: Self-pay

## 2021-08-18 DIAGNOSIS — I2609 Other pulmonary embolism with acute cor pulmonale: Secondary | ICD-10-CM

## 2021-08-22 MED ORDER — APIXABAN 2.5 MG PO TABS
2.5000 mg | ORAL_TABLET | Freq: Two times a day (BID) | ORAL | 0 refills | Status: DC
Start: 2021-08-22 — End: 2021-09-18

## 2021-08-25 ENCOUNTER — Encounter: Payer: Self-pay | Admitting: Hematology & Oncology

## 2021-08-25 ENCOUNTER — Other Ambulatory Visit: Payer: Self-pay | Admitting: Hematology & Oncology

## 2021-08-25 ENCOUNTER — Ambulatory Visit: Payer: Medicare Other | Attending: Hematology & Oncology | Admitting: Hematology & Oncology

## 2021-08-25 DIAGNOSIS — D696 Thrombocytopenia, unspecified: Secondary | ICD-10-CM | POA: Insufficient documentation

## 2021-08-25 DIAGNOSIS — C9000 Multiple myeloma not having achieved remission: Secondary | ICD-10-CM | POA: Insufficient documentation

## 2021-08-25 DIAGNOSIS — D649 Anemia, unspecified: Secondary | ICD-10-CM | POA: Insufficient documentation

## 2021-08-25 DIAGNOSIS — I2699 Other pulmonary embolism without acute cor pulmonale: Secondary | ICD-10-CM | POA: Insufficient documentation

## 2021-08-25 DIAGNOSIS — D472 Monoclonal gammopathy: Secondary | ICD-10-CM

## 2021-09-01 ENCOUNTER — Ambulatory Visit (INDEPENDENT_AMBULATORY_CARE_PROVIDER_SITE_OTHER): Payer: Medicare Other | Admitting: Cardiovascular Disease

## 2021-09-01 ENCOUNTER — Encounter (INDEPENDENT_AMBULATORY_CARE_PROVIDER_SITE_OTHER): Payer: Self-pay | Admitting: Cardiovascular Disease

## 2021-09-01 DIAGNOSIS — I2510 Atherosclerotic heart disease of native coronary artery without angina pectoris: Secondary | ICD-10-CM

## 2021-09-01 DIAGNOSIS — I252 Old myocardial infarction: Secondary | ICD-10-CM

## 2021-09-01 DIAGNOSIS — Z952 Presence of prosthetic heart valve: Secondary | ICD-10-CM

## 2021-09-01 DIAGNOSIS — I5020 Unspecified systolic (congestive) heart failure: Secondary | ICD-10-CM

## 2021-09-01 LAB — ECG 12-LEAD
Atrial Rate: 59 {beats}/min
P Axis: 11 degrees
P-R Interval: 156 ms
Q-T Interval: 480 ms
QRS Duration: 74 ms
QTC Calculation (Bezet): 475 ms
R Axis: 21 degrees
T Axis: 26 degrees
Ventricular Rate: 59 {beats}/min

## 2021-09-01 MED ORDER — SPIRONOLACTONE 25 MG PO TABS
12.5000 mg | ORAL_TABLET | Freq: Every day | ORAL | 3 refills | Status: DC
Start: 2021-09-01 — End: 2022-03-09

## 2021-09-01 NOTE — Progress Notes (Signed)
[facility] OFFICE VISIT    I had the pleasure of seeing Rick [name] today for cardiovascular follow up. He is a pleasant 88 y.o. male with a history of CAD/CABG, heart failure reduced ejection fraction, status post TAVR who presents for cardiac follow-up.  His last echo showed an EF of 25 to 30% with severe biatrial enlargement.  And there was a normally functioning 29 mm TAVR in place.  He has moderate pulmonary hypertension PA pressures of 54 mmHg.He is walking walking in the house, using a walker . He has not had any falls while walking .    MEDICATIONS: He has a current medication list which includes the following prescription(s): acetaminophen - Take 2 tablets (650 mg total) by mouth every 4 (four) hours as needed for Pain, apixaban - Take 1 tablet (2.5 mg) by mouth every 12 (twelve) hours, docusate sodium - Take 1 capsule (100 mg) by mouth 2 (two) times daily, empagliflozin - Take 1 tablet (10 mg) by mouth daily, furosemide - Take 1 tablet (40 mg) by mouth daily, memantine - Take 2 tablets (20 mg total) by mouth every evening, multivitamin - Take 1 capsule by mouth daily, rosuvastatin - Take 1 tablet (10 mg) by mouth daily (Patient taking differently: Take 0.5 tablets (5 mg) by mouth daily), sacubitril-valsartan - Take 0.5 tablets by mouth 2 (two) times daily, vitamin d - Take 2 tablets (2,000 Units) by mouth Once every Monday, Wednesday and Friday morning, hydrocortisone - Place rectally 2 (two) times daily (Patient not taking: Reported on 09/01/2021), and benefiber drink mix - Take 1 Package by mouth daily (Patient not taking: Reported on 08/25/2021).    REVIEW OF SYSTEMS: All other systems reviewed and negative except as stated above.    PHYSICAL EXAMINATION  General Appearance: A well-appearing male in no acute distress.   Vital Signs: There were no vitals taken for this visit.   HEENT: Sclera anicteric, conjunctiva without pallor, moist mucous membranes, normal dentition.   Neck: Supple  without jugular venous distention. Thyroid nonpalpable. Normal carotid upstrokes without bruits.  Chest: Clear to auscultation bilaterally with good air movement and respiratory effort and no wheezes, rales, or rhonchi, well-healed midline surgical incision from previous cardiac surgery  Cardiovascular: Normal S1 and physiologically split S2 without murmurs, gallops or rub. PMI of normal size and nondisplaced.   Abdomen: Soft, nontender. No organomegaly.  No pulsatile masses or bruits.    Extremities: Warm without edema. All peripheral pulses are full and equal.  Skin: No rash, xanthoma or xanthelasma.   Neuro: Alert and oriented x3. Grossly intact. Strength is symmetrical. Normal mood and affect.     ECG: Sinus rhythm borderline LVH no other acute changes noted.    LABS: Hemoglobin 12.4 BUN and creatinine-25/1.4    IMPRESSION/RECOMMENDATIONS: Rick [name] is a 88 y.o. male who presents for follow up.   CAD/CABG-stable without CP or SOB.   Status post TAVR- stable echo   Heart failure reduced ejection fraction- will add aldactone and discused ICD with pt and he wants to consider it. Will refer to EP for appropriateness  Dyslipidemia- on statin Rx      Fortino Sic, MD, [facility]        This note was generated by the Epic EMR system/ Dragon speech recognition and may contain inherent errors or omissions not intended by the user. Grammatical errors, random word insertions, deletions and pronoun errors  are occasional consequences of this technology due to software limitations. Not all  errors are caught or corrected. If there are questions or concerns about the content of this note or information contained within the body of this dictation they should be addressed directly with the author for clarification.

## 2021-09-15 ENCOUNTER — Other Ambulatory Visit (INDEPENDENT_AMBULATORY_CARE_PROVIDER_SITE_OTHER): Payer: Self-pay

## 2021-09-15 ENCOUNTER — Encounter (INDEPENDENT_AMBULATORY_CARE_PROVIDER_SITE_OTHER): Payer: Self-pay | Admitting: Cardiovascular Disease

## 2021-09-15 ENCOUNTER — Other Ambulatory Visit (INDEPENDENT_AMBULATORY_CARE_PROVIDER_SITE_OTHER): Payer: Self-pay | Admitting: Cardiovascular Disease

## 2021-09-15 DIAGNOSIS — I2609 Other pulmonary embolism with acute cor pulmonale: Secondary | ICD-10-CM

## 2021-09-15 NOTE — Telephone Encounter (Signed)
LOV 09/01/2021  No f/u scheduled    [facility] - [place], [place] - [address]

## 2021-09-17 ENCOUNTER — Other Ambulatory Visit (INDEPENDENT_AMBULATORY_CARE_PROVIDER_SITE_OTHER): Payer: Self-pay | Admitting: Cardiovascular Disease

## 2021-09-17 DIAGNOSIS — I2609 Other pulmonary embolism with acute cor pulmonale: Secondary | ICD-10-CM

## 2021-09-18 ENCOUNTER — Other Ambulatory Visit (INDEPENDENT_AMBULATORY_CARE_PROVIDER_SITE_OTHER): Payer: Self-pay

## 2021-09-18 ENCOUNTER — Other Ambulatory Visit (FREE_STANDING_LABORATORY_FACILITY): Payer: Medicare Other

## 2021-09-18 DIAGNOSIS — I2510 Atherosclerotic heart disease of native coronary artery without angina pectoris: Secondary | ICD-10-CM

## 2021-09-18 DIAGNOSIS — I2609 Other pulmonary embolism with acute cor pulmonale: Secondary | ICD-10-CM

## 2021-09-18 DIAGNOSIS — Z952 Presence of prosthetic heart valve: Secondary | ICD-10-CM

## 2021-09-18 DIAGNOSIS — I252 Old myocardial infarction: Secondary | ICD-10-CM

## 2021-09-18 LAB — BASIC METABOLIC PANEL
Anion Gap: 6 (ref 5.0–15.0)
BUN: 34 mg/dL — ABNORMAL HIGH (ref 9.0–28.0)
CO2: 30 mEq/L — ABNORMAL HIGH (ref 17–29)
Calcium: 9.6 mg/dL (ref 7.9–10.2)
Chloride: 106 mEq/L (ref 99–111)
Creatinine: 1.6 mg/dL — ABNORMAL HIGH (ref 0.5–1.5)
Glucose: 90 mg/dL (ref 70–100)
Potassium: 4.4 mEq/L (ref 3.5–5.3)
Sodium: 142 mEq/L (ref 135–145)
eGFR: 39.2 mL/min/{1.73_m2} — AB (ref >=60)

## 2021-09-18 LAB — HEMOLYSIS INDEX: Hemolysis Index: 4 Index (ref 0–24)

## 2021-09-18 MED ORDER — APIXABAN 2.5 MG PO TABS
2.5000 mg | ORAL_TABLET | Freq: Two times a day (BID) | ORAL | 0 refills | Status: DC
Start: 2021-09-18 — End: 2021-11-25

## 2021-09-18 NOTE — Telephone Encounter (Signed)
LOV:09/01/2021  Scheduled to be seen 09/23/2021

## 2021-09-23 ENCOUNTER — Ambulatory Visit (INDEPENDENT_AMBULATORY_CARE_PROVIDER_SITE_OTHER): Payer: Medicare Other | Admitting: Cardiovascular Disease

## 2021-09-23 ENCOUNTER — Encounter (INDEPENDENT_AMBULATORY_CARE_PROVIDER_SITE_OTHER): Payer: Self-pay | Admitting: Cardiovascular Disease

## 2021-09-23 DIAGNOSIS — E785 Hyperlipidemia, unspecified: Secondary | ICD-10-CM

## 2021-09-23 DIAGNOSIS — I493 Ventricular premature depolarization: Secondary | ICD-10-CM

## 2021-09-23 DIAGNOSIS — I5020 Unspecified systolic (congestive) heart failure: Secondary | ICD-10-CM

## 2021-09-23 DIAGNOSIS — I2510 Atherosclerotic heart disease of native coronary artery without angina pectoris: Secondary | ICD-10-CM

## 2021-09-23 DIAGNOSIS — I252 Old myocardial infarction: Secondary | ICD-10-CM

## 2021-09-23 DIAGNOSIS — Z952 Presence of prosthetic heart valve: Secondary | ICD-10-CM

## 2021-09-23 DIAGNOSIS — I4510 Unspecified right bundle-branch block: Secondary | ICD-10-CM

## 2021-09-23 LAB — ECG 12-LEAD
Atrial Rate: 47 {beats}/min
P Axis: 63 degrees
P-R Interval: 188 ms
Q-T Interval: 500 ms
QRS Duration: 152 ms
QTC Calculation (Bezet): 442 ms
R Axis: -32 degrees
T Axis: 26 degrees
Ventricular Rate: 47 {beats}/min

## 2021-09-23 NOTE — Progress Notes (Signed)
[facility] Electrophysiology Consult    Chief Complaint   Patient presents with    EP Consult     Eval for appropriateness for ICD       Assessment and Plan     88 y.o. male who presents for ICD consideration which he has spoken to his cardiologist about.  CAD/CABG-stable without CP or SOB.   Status post TAVR- stable echo EF 25%  Heart failure reduced ejection fraction- aldactone recently added by cardiology (to optimize pt's guideline directed medical therapy) and ICD was discussed with pt by cardiology and he wants to consider it  Dyslipidemia- on statin Rx  Mod PHTN by echo  Memory impairment -- son administers medication for father.      Plan:  BP was very low in office and we advised pt hydrate more and hold his Sherryll Burger this PM    We had extensive discussion regarding ICD implant in his age group; he obviously has competing risks.  His memory impairment is mild.  He has longstanding RBBB.  He reports compliance with meds and recently tolerated a hernia operation at [facility].  He has plasma cell dyscrasia.    Recommendations: we reviewed procedural details, possible benefits (not known) and known risks, which are likely heightened given patient's advanced age.  We reviewed making sure no concomitant health issues ongoing and will d/w his hematologist and update family after that discussion.    If no major issues, will work on scheduling the procedure, that patient is interested in pursuing.    History of Present Illness     Here for EP consult for ICD.  He is not on BB due to bradycardia concerns.  [phone] (phone number to son).  He is on Eliquis 2.5 mg BID; he has had PE history.  He is on Entresto, Aldactone, Jardiance, Lasix and Crestor.  TAVR in 2021.   Longstanding RBBB, bradycardic today.  No syncope.    Lives with son, Carrington, II.  Physical activity is limited overall, light exercises.  Very limited.  One year ago, he had PE.  He is on Eliquis for this.  Not doing PT    930 gets his meds; goes to shower on  his own.  Son lays out his clothes  Takes [place], [place] and Eliquis, lasix in am  Crestor and Entresto at night  Half of Entresto at night    Son is the medical POA.    He had hernia operation at [facility] earlier this year.   Bone marrow bx performed -- hematology (Dr. Marko Plume, #hematology)  147-148 lb reported as stable home weight.    Summary    * The left ventricular cavity size is dilated.  Left ventricular wall thickness is normal.  Left ventricular systolic function is severely decreased with an estimated ejection fraction of 25-30%.    * The right ventricular cavity size is upper limits of normal in size. Mildly decreased right ventricular systolic function.    * Severe bi-atrial dilation.    * There is a 29mm Sapien 3 TAVR valve in aortic position that is normally functioning.    * There is severe tricuspid regurgitation.  Moderate pulmonary hypertension with estimated right ventricular systolic pressure of 54 mmHg.    * There is moderate pulmonic valve regurgitation.    * There is mild to moderate mitral regurgitation.    * Elevated right atrial pressure.    * Compared to the prior study dated 08/25/2019 (personally reviewed), tricuspid regurgitation increased. There  are frequent PVC's throughout the length of this study. Otherwise, no significant changes.      Findings  Left Ventricle    The left ventricular cavity size is dilated. Left ventricular wall thickness is normal. Left ventricular systolic function is severely decreased with an estimated ejection fraction of 25-30%. There is paradoxical septal wall motion consistent with conduction abnormality. No other regional wall motion abnormalities noted. There is grade 2 (pseudonormal) LV diastolic dysfunction, with increased left atrial mean pressure. There are frequent PVC's noted throughout the length of the study.  Right Ventricle    The right ventricular cavity size is upper limits of normal in size. Mildly decreased right ventricular systolic  function. TAPSE = 1.4 cm.      Left Atrium    The left atrium is severely dilated.     Right Atrium    The right atrium is severely dilated.     Atrial Septum    No evidence of interatrial shunt by color Doppler.      Aortic Valve    There is a 29mm Sapien 3 TAVR valve in aortic position. There is trace aortic regurgitation, which may be physiologic. There is normal function of the bioprosthetic aortic valve with a peak velocity of 247.0 cm/s, mean gradient of 14 mmHg, and a dimensionless index of 0.25.     Pulmonary Valve    There is moderate pulmonic valve regurgitation. There is no pulmonic valve stenosis.     Mitral Valve    There is mild to moderate mitral regurgitation (RV=29 ml, ERO=0.2 cm2).     Tricuspid Valve    There is severe tricuspid regurgitation. Moderate pulmonary hypertension with estimated right ventricular systolic pressure of  54 mmHg.       Past Medical History     Past Medical History:   Diagnosis Date    Atrial enlargement, bilateral Echo result 04/04/19    CAD (coronary artery disease) 05/28/19 Cath results.    Severe 3V CAD.  Patent LIMA to LAD, patent SVG to OM 1 and OM 2 of circumflex CA, & patent SVG to RCA.    Chronic combined systolic and diastolic heart failure 310/21 OV Dr. Penny Pia    EF=30%    Dementia 04/2014    Difficulty walking 06/2016    Post surgery of lt knee, rt femur    Dyslipidemia 310/21 OV Dr. Penny Pia    Dyspnea on exertion 02/2018    HTN (hypertension) 310/21 OV Dr. Penny Pia    Mild cognitive impairment 310/21 OV Dr. Penny Pia    Primary cardiomyopathy 11/2017    RBBB 07/10/19 EKg result    S/P CABG (coronary artery bypass graft)      LIMA to LAD,  SVG to OM 1 & OM 2 of circumflex CA, & SVG to RCA.    Stented coronary artery     VTE (venous thromboembolism) Feb 2023    In lungs, combined with bronchitis       Past Surgical History     Past Surgical History:   Procedure Laterality Date    CARDIAC VALVE REPLACEMENT  12 Jul 2019    Atrial valve replacement    CORONARY ARTERY  BYPASS GRAFT  2003     approx year: LIMA to LAD,  SVG to OM 1 & OM 2 of circumflex CA, & SVG to RCA    FRACTURE SURGERY Right 06/2016    femur    HERNIORRHAPHY, INGUINAL Right 04/29/2021    Procedure: OPEN RIGHT HERNIORRHAPHY, INGUINAL  WITH MESH;  Surgeon: Towanda Octave, MD;  Location: [facility];  Service: General;  Laterality: Right;    PERCUTANEOUS VALVULOPLASTY - AORTIC N/A 07/12/2019    Procedure: TAVR #2;  Surgeon: Arlyss Queen, MD PhD;  Location: [facility];  Service: Cardiovascular;  Laterality: N/A;  23 HR OB    REPLACEMENT, AORTIC VALVE , EDWARDS SAPIEN N/A 07/12/2019    Procedure: REPLACEMENT, AORTIC VALVE , EDWARDS SAPIEN -- TAVR Valve:   S3 29mm -- Access: RTF -- IC: Dr. Penny Pia;  Surgeon: Christene Lye, MD;  [facility];  Service: Cardiothoracic;  Laterality: N/A;    RIGHT & LEFT HEART CATH POSSIBLE PCI N/A 05/26/2019    Procedure: RIGHT & LEFT HEART CATH POSSIBLE PCI;  Surgeon: Arlyss Queen, MD PhD;  Location: [facility];  Service: Cardiovascular;  Laterality: N/A;  [id number] scheduled with Maira in dr's office. OP. kr    TAVR PERCUTANEOUS FEMORAL  07/13/2019         TOTAL KNEE ARTHROPLASTY Left 2017    TOTAL KNEE ARTHROPLASTY Right 2005       Family History     Family History   Problem Relation Age of Onset    No known problems Mother     No known problems Father     No known problems Sister     No known problems Brother        Social History     Social History     Socioeconomic History    Marital status: Widowed   Tobacco Use    Smoking status: Never    Smokeless tobacco: Never   Vaping Use    Vaping Use: Never used   Substance and Sexual Activity    Alcohol use: Not Currently    Drug use: Never    Sexual activity: Not Currently     Partners: Female     Birth control/protection: None     Social Determinants of Health     Financial Resource Strain: Low Risk  (05/30/2021)    Overall Financial Resource Strain (CARDIA)     Difficulty of Paying Living Expenses: Not  hard at all   Food Insecurity: No Food Insecurity (05/30/2021)    Hunger Vital Sign     Worried About Running Out of Food in the Last Year: Never true     Ran Out of Food in the Last Year: Never true   Transportation Needs: No Transportation Needs (05/30/2021)    PRAPARE - Therapist, art (Medical): No     Lack of Transportation (Non-Medical): No   Physical Activity: Inactive (05/30/2021)    Exercise Vital Sign     Days of Exercise per Week: 0 days     Minutes of Exercise per Session: 0 min   Stress: No Stress Concern Present (05/30/2021)    [employer] of Occupational Health - Occupational Stress Questionnaire     Feeling of Stress : Not at all   Social Connections: Socially Isolated (05/30/2021)    Social Connection and Isolation Panel [NHANES]     Frequency of Communication with Friends and Family: Once a week     Frequency of Social Gatherings with Friends and Family: Never     Attends Religious Services: 1 to 4 times per year     Active Member of [employer] or Organizations: No     Attends Banker Meetings: Never     Marital Status: Widowed  Intimate Partner Violence: Not At Risk (05/30/2021)    Humiliation, Afraid, Rape, and Kick questionnaire     Fear of Current or Ex-Partner: No     Emotionally Abused: No     Physically Abused: No     Sexually Abused: No   Housing Stability: Low Risk  (05/30/2021)    Housing Stability Vital Sign     Unable to Pay for Housing in the Last Year: No     Number of Places Lived in the Last Year: 1     Unstable Housing in the Last Year: No       Allergies     No Known Allergies    Medications     Current Outpatient Medications   Medication Sig Dispense Refill    acetaminophen (TYLENOL) 325 MG tablet Take 2 tablets (650 mg total) by mouth every 4 (four) hours as needed for Pain      apixaban (Eliquis) 2.5 MG Take 1 tablet (2.5 mg) by mouth every 12 (twelve) hours 180 tablet 0    docusate sodium (COLACE) 100 MG capsule Take 1 capsule (100 mg) by  mouth 2 (two) times daily      empagliflozin (JARDIANCE) 10 MG tablet Take 1 tablet (10 mg) by mouth daily 90 tablet 3    furosemide (LASIX) 40 MG tablet Take 1 tablet (40 mg) by mouth daily 90 tablet 3    memantine (NAMENDA) 10 MG tablet Take 2 tablets (20 mg total) by mouth every evening 180 tablet 0    Multiple Vitamin (multivitamin) capsule Take 1 capsule by mouth daily      rosuvastatin (CRESTOR) 10 MG tablet Take 1 tablet (10 mg) by mouth daily (Patient taking differently: Take 0.5 tablets (5 mg) by mouth daily) 90 tablet 0    sacubitril-valsartan (ENTRESTO) 24-26 MG Tab per tablet Take 0.5 tablets by mouth 2 (two) times daily 90 tablet 3    vitamin D (CHOLECALCIFEROL) 25 MCG (1000 UT) tablet Take 2 tablets (2,000 Units) by mouth Once every Monday, Wednesday and Friday morning      spironolactone (ALDACTONE) 25 MG tablet Take 0.5 tablets (12.5 mg) by mouth daily (Patient not taking: Reported on 09/23/2021) 90 tablet 3    Wheat Dextrin (Benefiber Drink Mix) Pack Take 1 Package by mouth daily (Patient not taking: Reported on 08/25/2021) 28 each 5     No current facility-administered medications for this visit.         Review of Systems     Constitutional: Negative for fevers and chills  Skin: No rash or lesions  Respiratory: Negative for cough, wheezing, or hemoptysis  Cardiovascular: as per HPI  Gastrointestinal: Negative for abdominal pain, nausea, vomiting and diarrhea  Musculoskeletal:  No arthritic symptoms  Genitourinary: Negative for dysuria  Otherwise 10 point review of systems is negative.      Physical Exam     Vitals:    09/23/21 1434   BP: (!) 82/43   Pulse: (!) 51   Resp: 16   SpO2: 97%       Body mass index is 22.66 kg/m.    General:  Patient appears their stated age, well-nourished.  Alert and in no apparent distress.  Eyes: No conjunctivitis, no purulent discharge, no lid lag  ENT:  Hearing grossly intact, Nares patent bilaterally, Lips moist, color appropriate for race.  Respiratory: Clear to  auscultation and percussion throughout. Respiratory effort unlabored, chest expansion symmetric.    Cardio: Regular rate and rhythm. Normal S1/S2 No  carotid bruits or thrills, no JVD.  Extremities: warm, pulses 2+, no peripheral edema  GI: Soft, nondistended, nontender.  No guarding or rebound.  Skin: Color appropriate for race, Skin warm, dry, and intact  Psychiatric: Good insight and judgment, oriented to person, place, and time    Labs     Lipid Panel   Cholesterol   Date/Time Value Ref Range Status   01/01/2021 03:00 PM 172 0 - 199 mg/dL Final     Triglycerides   Date/Time Value Ref Range Status   01/01/2021 03:00 PM 67 34 - 149 mg/dL Final     HDL   Date/Time Value Ref Range Status   01/01/2021 03:00 PM 73 40 - 9,999 mg/dL Final     Comment:     An HDL cholesterol <40 mg/dL is low and constitutes a  coronary heart disease risk factor, and HDL-C>59 mg/dL is  a negative risk factor for CHD.  Ref: American Heart Association; Circulation 2004       CBC   Lab Results   Component Value Date    WBC 3.21 07/21/2021    HGB 12.4 (L) 07/21/2021    HCT 37.8 07/21/2021    MCV 105.9 (H) 07/21/2021    PLT 83 (L) 07/21/2021      BMP  Lab Results   Component Value Date    CO2 30 (H) 09/18/2021    BUN 34.0 (H) 09/18/2021     INR   Lab Results   Component Value Date    INR 1.2 (H) 07/10/2019    INR 1.1 05/17/2019        EKG   I have reviewed and interpreted the EKG.            I spent 60 mins with the patient, >50% of the time was spent on education and counseling.      Renard Hamper, MD, MD, MS    [facility] Cardiology Electrophysiology

## 2021-09-24 ENCOUNTER — Encounter (INDEPENDENT_AMBULATORY_CARE_PROVIDER_SITE_OTHER): Payer: Self-pay

## 2021-11-19 ENCOUNTER — Other Ambulatory Visit: Payer: Medicare Other

## 2021-11-21 ENCOUNTER — Other Ambulatory Visit (FREE_STANDING_LABORATORY_FACILITY): Payer: Medicare Other

## 2021-11-21 DIAGNOSIS — D472 Monoclonal gammopathy: Secondary | ICD-10-CM

## 2021-11-21 LAB — COMPREHENSIVE METABOLIC PANEL
ALT: 8 U/L (ref 0–55)
AST (SGOT): 15 U/L (ref 5–41)
Albumin/Globulin Ratio: 1 (ref 0.9–2.2)
Albumin: 3.6 g/dL (ref 3.5–5.0)
Alkaline Phosphatase: 56 U/L (ref 37–117)
Anion Gap: 6 (ref 5.0–15.0)
BUN: 36 mg/dL — ABNORMAL HIGH (ref 9.0–28.0)
Bilirubin, Total: 0.8 mg/dL (ref 0.2–1.2)
CO2: 27 mEq/L (ref 17–29)
Calcium: 9.4 mg/dL (ref 7.9–10.2)
Chloride: 109 mEq/L (ref 99–111)
Creatinine: 1.5 mg/dL (ref 0.5–1.5)
Globulin: 3.6 g/dL (ref 2.0–3.6)
Glucose: 75 mg/dL (ref 70–100)
Potassium: 4.9 mEq/L (ref 3.5–5.3)
Protein, Total: 7.2 g/dL (ref 6.0–8.3)
Sodium: 142 mEq/L (ref 135–145)
eGFR: 42.6 mL/min/{1.73_m2} — AB (ref >=60)

## 2021-11-21 LAB — CBC AND DIFFERENTIAL
Absolute NRBC: 0 10*3/uL (ref 0.00–0.00)
Basophils Absolute Automated: 0.01 10*3/uL (ref 0.00–0.08)
Basophils Automated: 0.3 %
Eosinophils Absolute Automated: 0.09 10*3/uL (ref 0.00–0.44)
Eosinophils Automated: 3 %
Hematocrit: 39.6 % (ref 37.6–49.6)
Hgb: 12.8 g/dL (ref 12.5–17.1)
Immature Granulocytes Absolute: 0.01 10*3/uL (ref 0.00–0.07)
Immature Granulocytes: 0.3 %
Instrument Absolute Neutrophil Count: 1.09 10*3/uL — ABNORMAL LOW (ref 1.10–6.33)
Lymphocytes Absolute Automated: 1.44 10*3/uL (ref 0.42–3.22)
Lymphocytes Automated: 47.8 %
MCH: 34.9 pg — ABNORMAL HIGH (ref 25.1–33.5)
MCHC: 32.3 g/dL (ref 31.5–35.8)
MCV: 107.9 fL — ABNORMAL HIGH (ref 78.0–96.0)
MPV: 12.6 fL — ABNORMAL HIGH (ref 8.9–12.5)
Monocytes Absolute Automated: 0.37 10*3/uL (ref 0.21–0.85)
Monocytes: 12.3 %
Neutrophils Absolute: 1.09 10*3/uL — ABNORMAL LOW (ref 1.10–6.33)
Neutrophils: 36.3 %
Nucleated RBC: 0 /100 WBC (ref 0.0–0.0)
Platelets: 86 10*3/uL — ABNORMAL LOW (ref 142–346)
RBC: 3.67 10*6/uL — ABNORMAL LOW (ref 4.20–5.90)
RDW: 13 % (ref 11–15)
WBC: 3.01 10*3/uL — ABNORMAL LOW (ref 3.10–9.50)

## 2021-11-21 LAB — IMMUNOGLOBULINS IGG, IGA, IGM, QUANTITATIVE
Immunoglobulin A: 213 mg/dL (ref 101–645)
Immunoglobulin G: 2355 mg/dL — ABNORMAL HIGH (ref 540–1822)
Immunoglobulin M: 188 mg/dL (ref 22–293)

## 2021-11-22 LAB — HEMOLYSIS INDEX(SOFT): Hemolysis Index: 10 Index (ref 0–24)

## 2021-11-23 LAB — SERUM PROTEIN ELECTROPHORESIS
Albumin %: 43.1 % — ABNORMAL LOW (ref 46.6–62.6)
Albumin: 3.1 g/dL — ABNORMAL LOW (ref 3.4–4.8)
Alpha-1 Glob %: 2.9 % (ref 1.7–4.1)
Alpha-1 Globulin: 0.2 g/dL (ref 0.1–0.4)
Alpha-2 Glob %: 11.8 % (ref 8.9–14.9)
Alpha-2 Globulin: 0.8 g/dL (ref 0.8–1.2)
Beta Glob %: 10.1 % — ABNORMAL LOW (ref 10.9–18.9)
Beta Globulin: 0.7 g/dL (ref 0.6–1.2)
GM-Spike %: 21.4 % — ABNORMAL HIGH
GM-Spike: 1.5 g/dL — ABNORMAL HIGH
Gamma Globulin %: 32.1 % — ABNORMAL HIGH (ref 9.8–24.4)
Gamma Globulin: 2.3 g/dL — ABNORMAL HIGH (ref 0.6–1.7)
Protein, Total: 7.2 g/dL (ref 6.0–8.3)

## 2021-11-24 ENCOUNTER — Other Ambulatory Visit (INDEPENDENT_AMBULATORY_CARE_PROVIDER_SITE_OTHER): Payer: Self-pay | Admitting: Cardiovascular Disease

## 2021-11-24 DIAGNOSIS — I2609 Other pulmonary embolism with acute cor pulmonale: Secondary | ICD-10-CM

## 2021-11-24 LAB — FREE KAPPA AND LAMBDA LIGHT CHAINS WITH RATIO, QUANTITATIVE
Kappa Free Light Chain: 0.78 mg/dL (ref 0.3300–1.94)
Kappa Lambda FLC Ratio: 0.3348 (ref 0.2600–1.65)
Lambda Free Light Chain: 2.33 mg/dL (ref 0.5700–2.63)

## 2021-11-24 LAB — LAB USE ONLY - SERUM PROTEIN ELECTROPHORESIS, PATH REVIEW

## 2021-11-24 LAB — BETA-2 MICROGLOBULIN: Beta-2-Microglobulin: 4.64 mg/L — ABNORMAL HIGH (ref <=2.51)

## 2021-11-25 ENCOUNTER — Other Ambulatory Visit (INDEPENDENT_AMBULATORY_CARE_PROVIDER_SITE_OTHER): Payer: Self-pay

## 2021-11-25 DIAGNOSIS — I2609 Other pulmonary embolism with acute cor pulmonale: Secondary | ICD-10-CM

## 2021-11-25 MED ORDER — APIXABAN 2.5 MG PO TABS
2.5000 mg | ORAL_TABLET | Freq: Two times a day (BID) | ORAL | 0 refills | Status: DC
Start: 2021-11-25 — End: 2022-02-17

## 2021-11-26 ENCOUNTER — Encounter: Payer: Self-pay | Admitting: Hematology & Oncology

## 2021-11-26 ENCOUNTER — Other Ambulatory Visit: Payer: Self-pay | Admitting: Hematology & Oncology

## 2021-11-26 ENCOUNTER — Ambulatory Visit: Payer: Medicare Other | Attending: Hematology & Oncology | Admitting: Hematology & Oncology

## 2021-11-26 DIAGNOSIS — C9000 Multiple myeloma not having achieved remission: Secondary | ICD-10-CM | POA: Insufficient documentation

## 2021-11-26 DIAGNOSIS — D472 Monoclonal gammopathy: Secondary | ICD-10-CM

## 2021-11-26 DIAGNOSIS — D696 Thrombocytopenia, unspecified: Secondary | ICD-10-CM | POA: Insufficient documentation

## 2021-11-26 DIAGNOSIS — I2699 Other pulmonary embolism without acute cor pulmonale: Secondary | ICD-10-CM | POA: Insufficient documentation

## 2021-11-26 NOTE — Progress Notes (Signed)
HISTORY OF PRESENT ILLNESS  Rick [name] is 88 y.o. male who comes in for follow up. He has a history of atherosclerosis s/p CABG, aortic stenosis s/p TAVR, vascular dementia, acute renal insufficiency, low back pain and balance disorder. He presented to me for a new finding of bilateral PE on 04/08/21. CTA showed segmental and subsegmental pulmonary emboli in the right lower lobe. There was also mild straightening of the interventricular septum as well as reflux of contrast into the intrahepatic IVC, suggesting right heart strain. Bilateral dopplers did not show evidence of clot. Echo showed severely reduced EF of 25-30%. CT of the abdomen and pelvis in 04/2021 showed small bowel obstruction with transition point at site of new right-sided inguinal hernia. He was started on Eliquis for his clot.      Labs show anemia with hemoglobin of 11.8 and thrombocytopenia with platelets of 70-80. He has mildly elevated creatinine of 1.2-1.5.      He was found to have a monoclonal gammopathy (IgG lambda). MRI testing (06/2021) as well as urine testing and bone marrow biopsy (07/31/21). He does have 8-10 % clonal plasma cells in the marrow with t(4;14) which is high risk. MRI does not show lytic bone lesions however and hemoglobin has improved. There was no evidence of renal failure or hypercalcemia.      Today Rick [name] presents in a wheelchair with his son. He lives with his son. He continues on Eliquis 2.5 mg by mouth twice daily and is tolerating this. He denies fever, chills, night sweats, or sweats. He is sleepy. Back pain is stable and muscular. He denies bleeding. He will be starting PT soon.     No Known Allergies  Current Outpatient Medications   Medication Sig Dispense Refill    acetaminophen (TYLENOL) 325 MG tablet Take 2 tablets (650 mg total) by mouth every 4 (four) hours as needed for Pain      apixaban (Eliquis) 2.5 MG Take 1 tablet (2.5 mg) by mouth every 12 (twelve) hours 180 tablet 0    docusate  sodium (COLACE) 100 MG capsule Take 1 capsule (100 mg) by mouth 2 (two) times daily      empagliflozin (JARDIANCE) 10 MG tablet Take 1 tablet (10 mg) by mouth daily 90 tablet 3    furosemide (LASIX) 40 MG tablet Take 1 tablet (40 mg) by mouth daily 90 tablet 3    memantine (NAMENDA) 10 MG tablet Take 2 tablets (20 mg total) by mouth every evening 180 tablet 0    Multiple Vitamin (multivitamin) capsule Take 1 capsule by mouth daily      rosuvastatin (CRESTOR) 10 MG tablet Take 1 tablet (10 mg) by mouth daily (Patient taking differently: Take 0.5 tablets (5 mg) by mouth daily) 90 tablet 0    sacubitril-valsartan (ENTRESTO) 24-26 MG Tab per tablet Take 0.5 tablets by mouth 2 (two) times daily 90 tablet 3    spironolactone (ALDACTONE) 25 MG tablet Take 0.5 tablets (12.5 mg) by mouth daily (Patient not taking: Reported on 09/23/2021) 90 tablet 3    vitamin D (CHOLECALCIFEROL) 25 MCG (1000 UT) tablet Take 2 tablets (2,000 Units) by mouth Once every Monday, Wednesday and Friday morning      Wheat Dextrin (Benefiber Drink Mix) Pack Take 1 Package by mouth daily (Patient not taking: Reported on 08/25/2021) 28 each 5     No current facility-administered medications for this visit.     Past Medical History:   Diagnosis Date    Atrial  enlargement, bilateral Echo result 04/04/19    CAD (coronary artery disease) 05/28/19 Cath results.    Severe 3V CAD.  Patent LIMA to LAD, patent SVG to OM 1 and OM 2 of circumflex CA, & patent SVG to RCA.    Chronic combined systolic and diastolic heart failure 310/21 OV Dr. Penny Piaamluji    EF=30%    Dementia 04/2014    Difficulty walking 06/2016    Post surgery of lt knee, rt femur    Dyslipidemia 310/21 OV Dr. Penny Piaamluji    Dyspnea on exertion 02/2018    HTN (hypertension) 310/21 OV Dr. Penny Piaamluji    Mild cognitive impairment 310/21 OV Dr. Penny Piaamluji    Primary cardiomyopathy 11/2017    RBBB 07/10/19 EKg result    S/P CABG (coronary artery bypass graft)      LIMA to LAD,  SVG to OM 1 & OM 2 of circumflex CA, & SVG  to RCA.    Stented coronary artery     VTE (venous thromboembolism) Feb 2023    In lungs, combined with bronchitis     Past Surgical History:   Procedure Laterality Date    CARDIAC VALVE REPLACEMENT  12 Jul 2019    Atrial valve replacement    CORONARY ARTERY BYPASS GRAFT  2003     approx year: LIMA to LAD,  SVG to OM 1 & OM 2 of circumflex CA, & SVG to RCA    FRACTURE SURGERY Right 06/2016    femur    HERNIORRHAPHY, INGUINAL Right 04/29/2021    Procedure: OPEN RIGHT HERNIORRHAPHY, INGUINAL WITH MESH;  Surgeon: Towanda OctaveSakata, Lissa C, MD;  Location: [facility];  Service: General;  Laterality: Right;    PERCUTANEOUS VALVULOPLASTY - AORTIC N/A 07/12/2019    Procedure: TAVR #2;  Surgeon: Arlyss Queenamluji, Abdulla A, MD PhD;  Location: [facility];  Service: Cardiovascular;  Laterality: N/A;  23 HR OB    REPLACEMENT, AORTIC VALVE , EDWARDS SAPIEN N/A 07/12/2019    Procedure: REPLACEMENT, AORTIC VALVE , EDWARDS SAPIEN -- TAVR Valve:   S3 29mm -- Access: RTF -- IC: Dr. Penny Piaamluji;  Surgeon: Christene LyeSarin, Eric L, MD;  [facility];  Service: Cardiothoracic;  Laterality: N/A;    RIGHT & LEFT HEART CATH POSSIBLE PCI N/A 05/26/2019    Procedure: RIGHT & LEFT HEART CATH POSSIBLE PCI;  Surgeon: Arlyss Queenamluji, Abdulla A, MD PhD;  Location: [facility];  Service: Cardiovascular;  Laterality: N/A;  [id number];  [id number] scheduled with Maira in dr's office. OP. kr    TAVR PERCUTANEOUS FEMORAL  07/13/2019         TOTAL KNEE ARTHROPLASTY Left 2017    TOTAL KNEE ARTHROPLASTY Right 2005     Family History   Problem Relation Age of Onset    No known problems Mother     No known problems Father     No known problems Sister     No known problems Brother      Social History     Socioeconomic History    Marital status: Widowed   Tobacco Use    Smoking status: Never    Smokeless tobacco: Never   Vaping Use    Vaping Use: Never used   Substance and Sexual Activity    Alcohol use: Not Currently    Drug use: Never    Sexual activity: Not Currently     Partners: Female      Birth control/protection: None     Social Determinants of Health  Financial Resource Strain: Low Risk  (11/20/2021)    Overall Financial Resource Strain (CARDIA)     Difficulty of Paying Living Expenses: Not hard at all   Food Insecurity: No Food Insecurity (11/20/2021)    Hunger Vital Sign     Worried About Running Out of Food in the Last Year: Never true     Ran Out of Food in the Last Year: Never true   Transportation Needs: No Transportation Needs (11/20/2021)    PRAPARE - Therapist, art (Medical): No     Lack of Transportation (Non-Medical): No   Physical Activity: Inactive (11/20/2021)    Exercise Vital Sign     Days of Exercise per Week: 0 days     Minutes of Exercise per Session: 0 min   Stress: No Stress Concern Present (11/20/2021)    [employer] of Occupational Health - Occupational Stress Questionnaire     Feeling of Stress : Not at all   Social Connections: Moderately Isolated (11/20/2021)    Social Connection and Isolation Panel [NHANES]     Frequency of Communication with Friends and Family: Once a week     Frequency of Social Gatherings with Friends and Family: More than three times a week     Attends Religious Services: 1 to 4 times per year     Active Member of [employer] or Organizations: No     Attends Banker Meetings: Never     Marital Status: Widowed   Intimate Partner Violence: Not At Risk (11/20/2021)    Humiliation, Afraid, Rape, and Kick questionnaire     Fear of Current or Ex-Partner: No     Emotionally Abused: No     Physically Abused: No     Sexually Abused: No   Housing Stability: Low Risk  (11/20/2021)    Housing Stability Vital Sign     Unable to Pay for Housing in the Last Year: No     Number of Places Lived in the Last Year: 1     Unstable Housing in the Last Year: No       REVIEW OF SYSTEMS  Review of Systems   Constitutional:  Positive for malaise/fatigue. Negative for chills and fever.   Respiratory:  Negative for shortness of breath.     Cardiovascular:  Negative for chest pain.   Gastrointestinal:  Negative for abdominal pain.   Musculoskeletal:  Positive for back pain (unchanged).   Neurological:  Positive for weakness.        Objective:      Physical Exam  Vitals reviewed.   Constitutional:       Appearance: Normal appearance.      Comments: Sleepy, in a wheelchair   HENT:      Head: Normocephalic.   Eyes:      General: No scleral icterus.     Conjunctiva/sclera: Conjunctivae normal.   Pulmonary:      Effort: Pulmonary effort is normal.   Neurological:      Mental Status: He is alert and oriented to person, place, and time.   Psychiatric:         Mood and Affect: Mood normal.         Behavior: Behavior normal.        Assessment & Plan:   Rick [name] is a 88 year old male who presents for bilateral PE. He also has anemia with thrombocytopenia.      In workup for anemia he was found  to have monoclonal gammopathy (IgG lambda) with M-spike of 1 gm/dL. 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      Bilateral PE. Rick [name] is doing well on 2.5 mg of Eliquis bid. He will continue with this. Blood counts show no anemia but mild thrombocytopenia (platelets 86,000) and leukopenia. He knows to hold on to support when ambulating to prevent falls.      2. Anemia. Rick [name] has monoclonal gammopathy (IgG lambda). Hemoglobin and renal function stable. No hypercalcemia.     3. Smoldering myeloma. I reviewed myeloma labs which are mixed. M-spike is higher at 1.5 gm/dL and IgG as well as lambda light chains are higher. No clear anemia or worsening renal insufficiency/hypercalcemia. Back pain stable. Also unclear that Rick [name] could tolerate chemotherapy with frailty and age 68.     He will continue with observation for now with repeat labs in 3 months time but will let me know if back pain worsens etc.     4. Thrombocytopenia. This is stable and may be due to medications- will continue to follow.      5.  Disposition. All of Rick [name] questions today were answered to his apparent satisfaction. He knows to call with any further questions or concerns.     Time spent 31 min

## 2021-11-28 ENCOUNTER — Encounter (INDEPENDENT_AMBULATORY_CARE_PROVIDER_SITE_OTHER): Payer: Self-pay | Admitting: Cardiovascular Disease

## 2021-12-19 ENCOUNTER — Encounter (INDEPENDENT_AMBULATORY_CARE_PROVIDER_SITE_OTHER): Payer: Self-pay

## 2022-01-12 ENCOUNTER — Other Ambulatory Visit (INDEPENDENT_AMBULATORY_CARE_PROVIDER_SITE_OTHER): Payer: Self-pay | Admitting: Student in an Organized Health Care Education/Training Program

## 2022-01-14 NOTE — Telephone Encounter (Signed)
RN called [facility] who advised patient still has 2 refills left. MyChart message sent to patient advising of this.

## 2022-01-21 LAB — ECG 12-LEAD
Atrial Rate: 46 {beats}/min
P Axis: 55 degrees
P-R Interval: 208 ms
Q-T Interval: 492 ms
QRS Duration: 162 ms
QTC Calculation (Bezet): 430 ms
R Axis: -43 degrees
T Axis: 7 degrees
Ventricular Rate: 46 {beats}/min

## 2022-01-27 LAB — ECG 12-LEAD
Atrial Rate: 57 {beats}/min
P Axis: 20 degrees
P-R Interval: 186 ms
Q-T Interval: 508 ms
QRS Duration: 152 ms
QTC Calculation (Bezet): 494 ms
R Axis: 51 degrees
T Axis: 60 degrees
Ventricular Rate: 57 {beats}/min

## 2022-02-11 ENCOUNTER — Encounter (INDEPENDENT_AMBULATORY_CARE_PROVIDER_SITE_OTHER): Payer: Self-pay | Admitting: Student in an Organized Health Care Education/Training Program

## 2022-02-17 ENCOUNTER — Ambulatory Visit
Admission: RE | Admit: 2022-02-17 | Discharge: 2022-02-17 | Disposition: A | Discharge status: Home / Self Care | Payer: Medicare Other | Source: Ambulatory Visit | Attending: Student in an Organized Health Care Education/Training Program | Admitting: Student in an Organized Health Care Education/Training Program

## 2022-02-17 ENCOUNTER — Encounter (INDEPENDENT_AMBULATORY_CARE_PROVIDER_SITE_OTHER): Payer: Self-pay | Admitting: Student in an Organized Health Care Education/Training Program

## 2022-02-17 ENCOUNTER — Ambulatory Visit (INDEPENDENT_AMBULATORY_CARE_PROVIDER_SITE_OTHER): Payer: Medicare Other | Admitting: Student in an Organized Health Care Education/Training Program

## 2022-02-17 VITALS — BP 95/42 | HR 50 | Temp 98.4°F | Resp 14 | Wt 167.0 lb

## 2022-02-17 DIAGNOSIS — S7290XA Unspecified fracture of unspecified femur, initial encounter for closed fracture: Secondary | ICD-10-CM | POA: Insufficient documentation

## 2022-02-17 DIAGNOSIS — Z96659 Presence of unspecified artificial knee joint: Secondary | ICD-10-CM | POA: Insufficient documentation

## 2022-02-17 DIAGNOSIS — R0609 Other forms of dyspnea: Secondary | ICD-10-CM

## 2022-02-17 DIAGNOSIS — R351 Nocturia: Secondary | ICD-10-CM | POA: Insufficient documentation

## 2022-02-17 DIAGNOSIS — M4712 Other spondylosis with myelopathy, cervical region: Secondary | ICD-10-CM | POA: Insufficient documentation

## 2022-02-17 DIAGNOSIS — M2011 Hallux valgus (acquired), right foot: Secondary | ICD-10-CM | POA: Insufficient documentation

## 2022-02-17 DIAGNOSIS — I5022 Chronic systolic (congestive) heart failure: Secondary | ICD-10-CM

## 2022-02-17 DIAGNOSIS — M2041 Other hammer toe(s) (acquired), right foot: Secondary | ICD-10-CM | POA: Insufficient documentation

## 2022-02-17 DIAGNOSIS — N400 Enlarged prostate without lower urinary tract symptoms: Secondary | ICD-10-CM | POA: Insufficient documentation

## 2022-02-17 DIAGNOSIS — I10 Essential (primary) hypertension: Secondary | ICD-10-CM | POA: Insufficient documentation

## 2022-02-17 DIAGNOSIS — I2609 Other pulmonary embolism with acute cor pulmonale: Secondary | ICD-10-CM

## 2022-02-17 DIAGNOSIS — M2042 Other hammer toe(s) (acquired), left foot: Secondary | ICD-10-CM | POA: Insufficient documentation

## 2022-02-17 DIAGNOSIS — D472 Monoclonal gammopathy: Secondary | ICD-10-CM | POA: Insufficient documentation

## 2022-02-17 DIAGNOSIS — I252 Old myocardial infarction: Secondary | ICD-10-CM | POA: Insufficient documentation

## 2022-02-17 NOTE — Assessment & Plan Note (Addendum)
-   LE edema, weight gain, and DOE, CXR as ordered, consider furosemide, f/u with cardiology, daily weights, monitor fluid intake

## 2022-02-17 NOTE — Progress Notes (Signed)
Patient ID: Rick [name]  is a 89 y.o.  male.     Chief Complaint   Patient presents with    Cough     For weeks cough, has tried OTC meds with little relief.    Wheezing     When standing       HPI    Problem   Acute Pulmonary Embolism With Acute Cor Pulmonale, Unspecified Pulmonary Embolism Type    Unprovoked PE, hematology following, s/p AC with Eliquis x 6mos, now off AC    CTA 04/08/21: Segmental and subsegmental pulmonary emboli in the right lower lobe.  Mild straightening of the interventricular septum as well as reflux of  contrast into the intrahepatic IVC, suggesting right heart strain.     Chronic Systolic Chf (Congestive Heart Failure)    Last echo EF 25-30% 04/09/2021  Compliant with Entresto, spironolactone 25mg , Jardiance 10mg   Not taking furosemide  Baseline weight of 146-147lbs,reports weight gain in the past few weeks  LE edema and DOE    Wt Readings from Last 3 Encounters:   02/17/22 75.8 kg (167 lb)   11/26/21 69.3 kg (152 lb 12.8 oz)   09/23/21 67.6 kg (149 lb)           The following portions of the patient's history were reviewed and updated as appropriate: current medications, allergies, past family history, past medical history, past social history, past surgical history and problem list.     Review of Systems     See HPI    OBJECTIVE     BP 95/42 (BP Site: Left arm, Patient Position: Sitting, Cuff Size: Medium)   Pulse (!) 50   Temp 98.4 F (36.9 C) (Oral)   Resp 14   Wt 75.8 kg (167 lb) Comment: wheelchair  BMI 25.39 kg/m     Wt Readings from Last 3 Encounters:   02/17/22 75.8 kg (167 lb)   11/26/21 69.3 kg (152 lb 12.8 oz)   09/23/21 67.6 kg (149 lb)       Physical Exam    GENERAL : alert and in no distress   SKIN: warm and dry, w/o rash, normal turgor   Head: ncat  Neck: supple, no mass/thyromegaly/LAD, FROM  Eyes: PERRLA, EOMI  Ears: external canals clear, TMs clear, hearing intact to finger rub  Nose: patent, no turbinate swelling/discharge  Throat: normal oropharynx,  no exudates/swelling  LUNGS: clear, no w/r/r  HEART: RRR normal S1 and S2  without gallop , murmur or rub  ABDOMEN  BS(+)  soft, non- tender , non- distended,  without organomegaly   PSYCH: Normal mood and affect, linear thought process, appropriate interactions, no SI/hallucinations      ASSESSMENT      Rick [name] is a 89 y.o. male with   1. DOE (dyspnea on exertion)  X-ray chest PA and lateral      2. Chronic systolic CHF (congestive heart failure)  Basic Metabolic Panel      3. Acute pulmonary embolism with acute cor pulmonale, unspecified pulmonary embolism type               PLAN     Chronic systolic CHF (congestive heart failure)  - LE edema, weight gain, and DOE, CXR as ordered, consider furosemide, f/u with cardiology, daily weights, monitor fluid intake    Acute pulmonary embolism with acute cor pulmonale, unspecified pulmonary embolism type  - Unprovoked PE, status post Eliquis x 6 months, now off [facility]  Call if symptoms persist, worsen, or change.  Call with updates/questions/concerns.    F/u PRN    Medication list reviewed with patient and updated as indicated.  Risk & Benefits of any new medication(s) were explained to the patient who verbalized understanding & agreed to the treatment plan.  Patient given a printed copy of AVS. Patient verbalized understanding of instructions given.    This note was generated by the Epic EMR system/ Dragon speech recognition and may contain inherent errors or omissions not intended by the user. Grammatical errors, random word insertions, deletions and pronoun errors  are occasional consequences of this technology due to software limitations. Not all errors are caught or corrected. If there are questions or concerns about the content of this note or information contained within the body of this dictation they should be addressed directly with the author for clarification

## 2022-02-17 NOTE — Assessment & Plan Note (Signed)
-   Unprovoked PE, status post Eliquis x 6 months, now off [facility]

## 2022-02-17 NOTE — Progress Notes (Signed)
Have you seen any specialists/other providers since your last visit with us?    No    Arm preference verified?   Yes, no preference    Health Maintenance Due   Topic Date Due    Medicare Annual Wellness Visit  Never done    Shingrix Vaccine 50+ (1) Never done    COVID-19 Vaccine (4 - 2023-24 season) 10/17/2021

## 2022-02-18 ENCOUNTER — Other Ambulatory Visit (INDEPENDENT_AMBULATORY_CARE_PROVIDER_SITE_OTHER): Payer: Self-pay | Admitting: Student in an Organized Health Care Education/Training Program

## 2022-02-18 DIAGNOSIS — R600 Localized edema: Secondary | ICD-10-CM

## 2022-02-18 DIAGNOSIS — I5022 Chronic systolic (congestive) heart failure: Secondary | ICD-10-CM

## 2022-02-18 MED ORDER — FUROSEMIDE 20 MG PO TABS
20.0000 mg | ORAL_TABLET | Freq: Every day | ORAL | 0 refills | Status: DC
Start: 2022-02-18 — End: 2022-05-11

## 2022-03-03 ENCOUNTER — Other Ambulatory Visit (FREE_STANDING_LABORATORY_FACILITY): Payer: Medicare Other

## 2022-03-03 DIAGNOSIS — D472 Monoclonal gammopathy: Secondary | ICD-10-CM

## 2022-03-03 LAB — COMPREHENSIVE METABOLIC PANEL
ALT: 11 U/L (ref 0–55)
AST (SGOT): 17 U/L (ref 5–41)
Albumin/Globulin Ratio: 0.9 (ref 0.9–2.2)
Albumin: 3.7 g/dL (ref 3.5–5.0)
Alkaline Phosphatase: 62 U/L (ref 37–117)
Anion Gap: 8 (ref 5.0–15.0)
BUN: 31 mg/dL — ABNORMAL HIGH (ref 9.0–28.0)
Bilirubin, Total: 0.8 mg/dL (ref 0.2–1.2)
CO2: 27 mEq/L (ref 17–29)
Calcium: 9.5 mg/dL (ref 7.9–10.2)
Chloride: 109 mEq/L (ref 99–111)
Creatinine: 1.4 mg/dL (ref 0.5–1.5)
Globulin: 4.2 g/dL — ABNORMAL HIGH (ref 2.0–3.6)
Glucose: 86 mg/dL (ref 70–100)
Potassium: 5.1 mEq/L (ref 3.5–5.3)
Protein, Total: 7.9 g/dL (ref 6.0–8.3)
Sodium: 144 mEq/L (ref 135–145)
eGFR: 46.2 mL/min/{1.73_m2} — AB (ref >=60)

## 2022-03-03 LAB — CBC AND DIFFERENTIAL
Absolute NRBC: 0 10*3/uL (ref 0.00–0.00)
Basophils Absolute Automated: 0.01 10*3/uL (ref 0.00–0.08)
Basophils Automated: 0.3 %
Eosinophils Absolute Automated: 0.1 10*3/uL (ref 0.00–0.44)
Eosinophils Automated: 2.9 %
Hematocrit: 41 % (ref 37.6–49.6)
Hgb: 13.1 g/dL (ref 12.5–17.1)
Immature Granulocytes Absolute: 0.02 10*3/uL (ref 0.00–0.07)
Immature Granulocytes: 0.6 %
Instrument Absolute Neutrophil Count: 1.46 10*3/uL (ref 1.10–6.33)
Lymphocytes Absolute Automated: 1.41 10*3/uL (ref 0.42–3.22)
Lymphocytes Automated: 40.8 %
MCH: 34.4 pg — ABNORMAL HIGH (ref 25.1–33.5)
MCHC: 32 g/dL (ref 31.5–35.8)
MCV: 107.6 fL — ABNORMAL HIGH (ref 78.0–96.0)
MPV: 11.9 fL (ref 8.9–12.5)
Monocytes Absolute Automated: 0.46 10*3/uL (ref 0.21–0.85)
Monocytes: 13.3 %
Neutrophils Absolute: 1.46 10*3/uL (ref 1.10–6.33)
Neutrophils: 42.1 %
Nucleated RBC: 0 /100 WBC (ref 0.0–0.0)
Platelets: 134 10*3/uL — ABNORMAL LOW (ref 142–346)
RBC: 3.81 10*6/uL — ABNORMAL LOW (ref 4.20–5.90)
RDW: 13 % (ref 11–15)
WBC: 3.46 10*3/uL (ref 3.10–9.50)

## 2022-03-03 LAB — IMMUNOGLOBULINS IGG, IGA, IGM, QUANTITATIVE
Immunoglobulin A: 217 mg/dL (ref 101–645)
Immunoglobulin G: 2594 mg/dL — ABNORMAL HIGH (ref 540–1822)
Immunoglobulin M: 199 mg/dL (ref 22–293)

## 2022-03-03 LAB — HEMOLYSIS INDEX(SOFT): Hemolysis Index: 10 Index (ref 0–24)

## 2022-03-04 LAB — SERUM PROTEIN ELECTROPHORESIS
Albumin %: 42.8 % — ABNORMAL LOW (ref 46.6–62.6)
Albumin: 3.4 g/dL (ref 3.4–4.8)
Alpha-1 Glob %: 3.2 % (ref 1.7–4.1)
Alpha-1 Globulin: 0.3 g/dL (ref 0.1–0.4)
Alpha-2 Glob %: 11.2 % (ref 8.9–14.9)
Alpha-2 Globulin: 0.9 g/dL (ref 0.8–1.2)
Beta Glob %: 10.2 % — ABNORMAL LOW (ref 10.9–18.9)
Beta Globulin: 0.8 g/dL (ref 0.6–1.2)
GM-Spike %: 19.4 % — ABNORMAL HIGH
GM-Spike: 1.5 g/dL — ABNORMAL HIGH
Gamma Globulin %: 32.5 % — ABNORMAL HIGH (ref 9.8–24.4)
Gamma Globulin: 2.6 g/dL — ABNORMAL HIGH (ref 0.6–1.7)
Protein, Total: 7.9 g/dL (ref 6.0–8.3)

## 2022-03-04 LAB — LAB USE ONLY - SERUM PROTEIN ELECTROPHORESIS, PATH REVIEW

## 2022-03-05 LAB — FREE KAPPA AND LAMBDA LIGHT CHAINS WITH RATIO, QUANTITATIVE
Kappa Free Light Chain: 4.9 mg/dL — ABNORMAL HIGH (ref 0.3300–1.94)
Kappa Lambda FLC Ratio: 1.63 (ref 0.2600–1.65)
Lambda Free Light Chain: 3.01 mg/dL — ABNORMAL HIGH (ref 0.5700–2.63)

## 2022-03-09 ENCOUNTER — Other Ambulatory Visit: Payer: Self-pay | Admitting: Hematology & Oncology

## 2022-03-09 ENCOUNTER — Encounter: Payer: Self-pay | Admitting: Hematology & Oncology

## 2022-03-09 ENCOUNTER — Other Ambulatory Visit (INDEPENDENT_AMBULATORY_CARE_PROVIDER_SITE_OTHER): Payer: Self-pay | Admitting: Cardiovascular Disease

## 2022-03-09 ENCOUNTER — Other Ambulatory Visit (INDEPENDENT_AMBULATORY_CARE_PROVIDER_SITE_OTHER): Payer: Self-pay

## 2022-03-09 ENCOUNTER — Ambulatory Visit: Payer: Medicare Other | Attending: Hematology & Oncology | Admitting: Hematology & Oncology

## 2022-03-09 DIAGNOSIS — D472 Monoclonal gammopathy: Secondary | ICD-10-CM

## 2022-03-09 DIAGNOSIS — I2609 Other pulmonary embolism with acute cor pulmonale: Secondary | ICD-10-CM | POA: Insufficient documentation

## 2022-03-09 DIAGNOSIS — D696 Thrombocytopenia, unspecified: Secondary | ICD-10-CM | POA: Insufficient documentation

## 2022-03-09 MED ORDER — SPIRONOLACTONE 25 MG PO TABS
12.5000 mg | ORAL_TABLET | Freq: Every day | ORAL | 0 refills | Status: DC
Start: 2022-03-09 — End: 2022-06-17

## 2022-03-09 NOTE — Progress Notes (Signed)
HISTORY OF PRESENT ILLNESS  Rick [name] is 89 y.o. male who comes in for follow up. He has a history of atherosclerosis s/p CABG, aortic stenosis s/p TAVR, vascular dementia, acute renal insufficiency, low back pain and balance disorder. He presented to me for a new finding of bilateral PE on 04/08/21. CTA showed segmental and subsegmental pulmonary emboli in the right lower lobe. There was also mild straightening of the interventricular septum as well as reflux of contrast into the intrahepatic IVC, suggesting right heart strain. Bilateral dopplers did not show evidence of clot. Echo showed severely reduced EF of 25-30%. CT of the abdomen and pelvis in 04/2021 showed small bowel obstruction with transition point at site of new right-sided inguinal hernia. He was started on Eliquis for his clot.      Labs show anemia with hemoglobin of 11.8 and thrombocytopenia with platelets of 70-80. He has mildly elevated creatinine of 1.2-1.5.      He was found to have a monoclonal gammopathy (IgG lambda). MRI testing (06/2021) as well as urine testing and bone marrow biopsy (07/31/21). He does have 8-10 % clonal plasma cells in the marrow with t(4;14) which is high risk. MRI does not show lytic bone lesions however and hemoglobin has improved. There was no evidence of renal failure or hypercalcemia.      Today Rick [name] presents in a wheelchair with his son. He lives with his son. He continues on Eliquis 2.5 mg by mouth twice daily and is tolerating this. He denies fever, chills, night sweats, or sweats. He is sleepy. Back pain is stable and muscular. He denies bleeding. He has had to take a break from PT due to a cold around December. He is still working to get over this.        No Known Allergies  Current Outpatient Medications   Medication Sig Dispense Refill    docusate sodium (COLACE) 100 MG capsule Take 1 capsule (100 mg) by mouth 2 (two) times daily      donepezil (ARICEPT) 5 MG tablet Take 1 tablet (5 mg)  by mouth nightly      empagliflozin (JARDIANCE) 10 MG tablet Take 1 tablet (10 mg) by mouth daily 90 tablet 3    furosemide (LASIX) 20 MG tablet Take 1 tablet (20 mg) by mouth daily 90 tablet 0    memantine (NAMENDA) 10 MG tablet Take 2 tablets (20 mg total) by mouth every evening 180 tablet 0    Multiple Vitamin (multivitamin) capsule Take 1 capsule by mouth daily      rosuvastatin (CRESTOR) 10 MG tablet Take 1 tablet (10 mg) by mouth daily 90 tablet 0    sacubitril-valsartan (ENTRESTO) 24-26 MG Tab per tablet Take 0.5 tablets by mouth 2 (two) times daily 90 tablet 3    spironolactone (ALDACTONE) 25 MG tablet Take 0.5 tablets (12.5 mg) by mouth daily 90 tablet 0    tamsulosin (FLOMAX) 0.4 MG Cap Take 1 capsule (0.4 mg) by mouth nightly      vitamin D (CHOLECALCIFEROL) 25 MCG (1000 UT) tablet Take 2 tablets (2,000 Units) by mouth Once every Monday, Wednesday and Friday morning       No current facility-administered medications for this visit.     Past Medical History:   Diagnosis Date    Atrial enlargement, bilateral Echo result 04/04/19    CAD (coronary artery disease) 05/28/19 Cath results.    Severe 3V CAD.  Patent LIMA to LAD, patent SVG to OM 1 and  OM 2 of circumflex CA, & patent SVG to RCA.    Chronic combined systolic and diastolic heart failure 761/60 OV Dr. Talmadge Chad    EF=30%    Dementia 04/2014    Difficulty walking 06/2016    Post surgery of lt knee, rt femur    Dyslipidemia 310/21 OV Dr. Talmadge Chad    Dyspnea on exertion 02/2018    HTN (hypertension) 310/21 OV Dr. Talmadge Chad    Mild cognitive impairment 310/21 OV Dr. Talmadge Chad    Primary cardiomyopathy 11/2017    RBBB 07/10/19 EKg result    S/P CABG (coronary artery bypass graft)      LIMA to LAD,  SVG to OM 1 & OM 2 of circumflex CA, & SVG to RCA.    Stented coronary artery     VTE (venous thromboembolism) Feb 2023    In lungs, combined with bronchitis     Past Surgical History:   Procedure Laterality Date    CARDIAC VALVE REPLACEMENT  12 Jul 2019    Atrial valve  replacement    CORONARY ARTERY BYPASS GRAFT  2003     approx year: LIMA to LAD,  SVG to OM 1 & OM 2 of circumflex CA, & SVG to RCA    FRACTURE SURGERY Right 06/2016    femur    HERNIORRHAPHY, INGUINAL Right 04/29/2021    Procedure: OPEN RIGHT HERNIORRHAPHY, INGUINAL WITH MESH;  Surgeon: Barbee Cough, MD;  Location: [facility];  Service: General;  Laterality: Right;    PERCUTANEOUS VALVULOPLASTY - AORTIC N/A 07/12/2019    Procedure: TAVR #2;  Surgeon: Priscille Heidelberg, MD PhD;  Location: [facility];  Service: Cardiovascular;  Laterality: N/A;  23 HR OB    REPLACEMENT, AORTIC VALVE , EDWARDS SAPIEN N/A 07/12/2019    Procedure: REPLACEMENT, AORTIC VALVE , EDWARDS SAPIEN -- TAVR Valve:   S3 68mm -- Access: RTF -- IC: Dr. Talmadge Chad;  Surgeon: Bjorn Loser, MD;  Location: [facility] OR;  Service: Cardiothoracic;  Laterality: N/A;    RIGHT & LEFT HEART CATH POSSIBLE PCI N/A 05/26/2019    Procedure: RIGHT & LEFT HEART CATH POSSIBLE PCI;  Surgeon: Priscille Heidelberg, MD PhD;  Location: [facility];  Service: Cardiovascular;  Laterality: N/A;  [id number] scheduled with Maira in dr's office. OP. kr    TAVR PERCUTANEOUS FEMORAL  07/13/2019         TOTAL KNEE ARTHROPLASTY Left 2017    TOTAL KNEE ARTHROPLASTY Right 2005     Family History   Problem Relation Age of Onset    No known problems Mother     No known problems Father     No known problems Sister     No known problems Brother      Social History     Socioeconomic History    Marital status: Widowed   Tobacco Use    Smoking status: Never    Smokeless tobacco: Never   Vaping Use    Vaping Use: Never used   Substance and Sexual Activity    Alcohol use: Not Currently    Drug use: Never    Sexual activity: Not Currently     Partners: Female     Birth control/protection: None     Social Determinants of Health     Financial Resource Strain: Low Risk  (02/17/2022)    Overall Financial Resource Strain (CARDIA)     Difficulty of Paying Living Expenses: Not hard at all    Food  Insecurity: No Food Insecurity (02/17/2022)    Hunger Vital Sign     Worried About Running Out of Food in the Last Year: Never true     Ran Out of Food in the Last Year: Never true   Transportation Needs: No Transportation Needs (02/17/2022)    PRAPARE - Therapist, art (Medical): No     Lack of Transportation (Non-Medical): No   Physical Activity: Inactive (02/17/2022)    Exercise Vital Sign     Days of Exercise per Week: 0 days     Minutes of Exercise per Session: 10 min   Stress: No Stress Concern Present (02/17/2022)    [employer] of Occupational Health - Occupational Stress Questionnaire     Feeling of Stress : Not at all   Social Connections: Socially Isolated (02/17/2022)    Social Connection and Isolation Panel [NHANES]     Frequency of Communication with Friends and Family: Once a week     Frequency of Social Gatherings with Friends and Family: Once a week     Attends Religious Services: More than 4 times per year     Active Member of [employer] or Organizations: No     Attends Banker Meetings: Never     Marital Status: Widowed   Intimate Partner Violence: Not At Risk (02/17/2022)    Humiliation, Afraid, Rape, and Kick questionnaire     Fear of Current or Ex-Partner: No     Emotionally Abused: No     Physically Abused: No     Sexually Abused: No   Housing Stability: Low Risk  (02/17/2022)    Housing Stability Vital Sign     Unable to Pay for Housing in the Last Year: No     Number of Places Lived in the Last Year: 1     Unstable Housing in the Last Year: No       REVIEW OF SYSTEMS  Review of Systems   Constitutional:  Positive for malaise/fatigue. Negative for chills and fever.   Respiratory:  Negative for shortness of breath.    Cardiovascular:  Negative for chest pain.   Gastrointestinal:  Negative for blood in stool.   Musculoskeletal:  Positive for back pain.   Neurological:  Positive for weakness.        Objective:     Vitals:    03/09/22 1532   Pulse: (!) 50   Temp:  98.1 F (36.7 C)   SpO2: 98%      Physical Exam  Vitals reviewed.   Constitutional:       Appearance: He is ill-appearing (in a wheelchair).   HENT:      Head: Normocephalic.   Eyes:      Conjunctiva/sclera: Conjunctivae normal.   Pulmonary:      Effort: Pulmonary effort is normal.   Musculoskeletal:         General: No swelling.   Neurological:      Mental Status: He is alert and oriented to person, place, and time.   Psychiatric:         Mood and Affect: Mood normal.         Behavior: Behavior normal.        Assessment & Plan:   Rick [name] is a 89 year old male who presents for bilateral PE. He also has anemia with thrombocytopenia.      In workup for anemia he was found to have monoclonal gammopathy (IgG lambda) with M-spike  of 1 gm/dL. 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      Bilateral PE. Rick [name] is doing well on 2.5 mg of Eliquis bid. He will continue with this. Blood counts show no anemia but mild thrombocytopenia but improved (platelets 134,000). White count now normal. He knows to hold on to support when ambulating to prevent falls.      2. Anemia. Rick [name] has monoclonal gammopathy (IgG lambda). Hemoglobin and renal function stable. No hypercalcemia.     3. Smoldering myeloma. I reviewed myeloma labs which are mixed. M-spike is stable at 1.5 gm/dL and IgG as well as lambda light chains are higher. No clear anemia or worsening renal insufficiency/hypercalcemia. Back pain stable. Also unclear that Rick [name] could tolerate chemotherapy with frailty and age 58.      He will continue with observation for now with repeat labs in 3 months time but will let me know if back pain worsens etc.     4. Thrombocytopenia. This is better and may be due to medications- will continue to follow.      5. Disposition. All of Rick [name] questions today were answered to his apparent satisfaction. He knows to call with any further questions or concerns.     Time spent  24 min

## 2022-03-12 ENCOUNTER — Other Ambulatory Visit (INDEPENDENT_AMBULATORY_CARE_PROVIDER_SITE_OTHER): Payer: Self-pay | Admitting: Cardiovascular Disease

## 2022-03-12 ENCOUNTER — Emergency Department: Payer: Medicare Other

## 2022-03-12 ENCOUNTER — Other Ambulatory Visit (INDEPENDENT_AMBULATORY_CARE_PROVIDER_SITE_OTHER): Payer: Self-pay

## 2022-03-12 ENCOUNTER — Emergency Department
Admission: EM | Admit: 2022-03-12 | Discharge: 2022-03-12 | Disposition: A | Discharge status: Home / Self Care | Payer: Medicare Other | Attending: Emergency Medicine | Admitting: Emergency Medicine

## 2022-03-12 DIAGNOSIS — I110 Hypertensive heart disease with heart failure: Secondary | ICD-10-CM | POA: Insufficient documentation

## 2022-03-12 DIAGNOSIS — R059 Cough, unspecified: Secondary | ICD-10-CM

## 2022-03-12 DIAGNOSIS — I5042 Chronic combined systolic (congestive) and diastolic (congestive) heart failure: Secondary | ICD-10-CM | POA: Insufficient documentation

## 2022-03-12 DIAGNOSIS — J189 Pneumonia, unspecified organism: Secondary | ICD-10-CM

## 2022-03-12 LAB — COMPREHENSIVE METABOLIC PANEL
ALT: 11 U/L (ref 0–55)
AST (SGOT): 18 U/L (ref 5–41)
Albumin/Globulin Ratio: 0.9 (ref 0.9–2.2)
Albumin: 3.5 g/dL (ref 3.5–5.0)
Alkaline Phosphatase: 63 U/L (ref 37–117)
Anion Gap: 6 (ref 5.0–15.0)
BUN: 26 mg/dL (ref 9.0–28.0)
Bilirubin, Total: 0.7 mg/dL (ref 0.2–1.2)
CO2: 28 mEq/L (ref 17–29)
Calcium: 9.7 mg/dL (ref 7.9–10.2)
Chloride: 111 mEq/L (ref 99–111)
Creatinine: 1.4 mg/dL (ref 0.5–1.5)
Globulin: 4 g/dL — ABNORMAL HIGH (ref 2.0–3.6)
Glucose: 81 mg/dL (ref 70–100)
Potassium: 4.7 mEq/L (ref 3.5–5.3)
Protein, Total: 7.5 g/dL (ref 6.0–8.3)
Sodium: 145 mEq/L (ref 135–145)
eGFR: 46.2 mL/min/{1.73_m2} — AB (ref >=60)

## 2022-03-12 LAB — CBC AND DIFFERENTIAL
Absolute NRBC: 0 10*3/uL (ref 0.00–0.00)
Basophils Absolute Automated: 0.02 10*3/uL (ref 0.00–0.08)
Basophils Automated: 0.7 %
Eosinophils Absolute Automated: 0.11 10*3/uL (ref 0.00–0.44)
Eosinophils Automated: 3.9 %
Hematocrit: 35.8 % — ABNORMAL LOW (ref 37.6–49.6)
Hgb: 12 g/dL — ABNORMAL LOW (ref 12.5–17.1)
Immature Granulocytes Absolute: 0 10*3/uL (ref 0.00–0.07)
Immature Granulocytes: 0 %
Instrument Absolute Neutrophil Count: 1.01 10*3/uL — ABNORMAL LOW (ref 1.10–6.33)
Lymphocytes Absolute Automated: 1.26 10*3/uL (ref 0.42–3.22)
Lymphocytes Automated: 44.4 %
MCH: 34.4 pg — ABNORMAL HIGH (ref 25.1–33.5)
MCHC: 33.5 g/dL (ref 31.5–35.8)
MCV: 102.6 fL — ABNORMAL HIGH (ref 78.0–96.0)
MPV: 11.8 fL (ref 8.9–12.5)
Monocytes Absolute Automated: 0.44 10*3/uL (ref 0.21–0.85)
Monocytes: 15.5 %
Neutrophils Absolute: 1.01 10*3/uL — ABNORMAL LOW (ref 1.10–6.33)
Neutrophils: 35.5 %
Nucleated RBC: 0 /100 WBC (ref 0.0–0.0)
Platelets: 109 10*3/uL — ABNORMAL LOW (ref 142–346)
RBC: 3.49 10*6/uL — ABNORMAL LOW (ref 4.20–5.90)
RDW: 13 % (ref 11–15)
WBC: 2.84 10*3/uL — ABNORMAL LOW (ref 3.10–9.50)

## 2022-03-12 LAB — URINALYSIS WITH REFLEX TO MICROSCOPIC EXAM - REFLEX TO CULTURE
Bilirubin, UA: NEGATIVE
Blood, UA: NEGATIVE
Ketones UA: NEGATIVE
Leukocyte Esterase, UA: NEGATIVE
Nitrite, UA: NEGATIVE
Protein, UR: NEGATIVE
Specific Gravity UA: 1.013 (ref 1.001–1.035)
Urine pH: 6 (ref 5.0–8.0)
Urobilinogen, UA: NORMAL mg/dL (ref 0.2–2.0)

## 2022-03-12 LAB — NT-PROBNP: NT-proBNP: 933 pg/mL — ABNORMAL HIGH (ref 0–450)

## 2022-03-12 LAB — CELL MORPHOLOGY
Cell Morphology: NORMAL
Platelet Clumps: NONE SEEN
Platelet Estimate: DECREASED — AB

## 2022-03-12 MED ORDER — AMOXICILLIN-POT CLAVULANATE 875-125 MG PO TABS
1.0000 | ORAL_TABLET | Freq: Two times a day (BID) | ORAL | 0 refills | Status: DC
Start: 2022-03-12 — End: 2022-03-12

## 2022-03-12 MED ORDER — AMOXICILLIN-POT CLAVULANATE 875-125 MG PO TABS
1.0000 | ORAL_TABLET | Freq: Two times a day (BID) | ORAL | 0 refills | Status: AC
Start: 2022-03-12 — End: 2022-03-22

## 2022-03-12 MED ORDER — AMOXICILLIN-POT CLAVULANATE 875-125 MG PO TABS
1.0000 | ORAL_TABLET | Freq: Once | ORAL | Status: AC
Start: 2022-03-12 — End: 2022-03-12
  Administered 2022-03-12: 1 via ORAL
  Filled 2022-03-12: qty 1

## 2022-03-13 ENCOUNTER — Telehealth (INDEPENDENT_AMBULATORY_CARE_PROVIDER_SITE_OTHER): Payer: Medicare Other | Admitting: Student in an Organized Health Care Education/Training Program

## 2022-03-13 LAB — ECG 12-LEAD
Atrial Rate: 57 {beats}/min
P Axis: 61 degrees
P-R Interval: 204 ms
Q-T Interval: 476 ms
QRS Duration: 158 ms
QTC Calculation (Bezet): 463 ms
R Axis: -32 degrees
T Axis: 43 degrees
Ventricular Rate: 57 {beats}/min

## 2022-03-16 NOTE — ED Provider Notes (Signed)
 EMERGENCY DEPARTMENT HISTORY AND PHYSICAL EXAM     None        Date: 03/12/2022  Patient Name: Rick [name]    History of Presenting Illness and Plan     Chief Complaint   Patient presents with    Cough       History Provided By: Patient's Son    HPI:     HPI documented within ED course below.       Associated symptoms and pertinent negative listed in ROS.    PCP: Erasmo Score, MD  SPECIALISTS:    No current facility-administered medications for this encounter.     Current Outpatient Medications   Medication Sig Dispense Refill    amoxicillin-clavulanate (AUGMENTIN) 875-125 MG per tablet Take 1 tablet by mouth 2 (two) times daily for 10 days 20 tablet 0    docusate sodium (COLACE) 100 MG capsule Take 1 capsule (100 mg) by mouth 2 (two) times daily      donepezil (ARICEPT) 5 MG tablet Take 1 tablet (5 mg) by mouth nightly      empagliflozin (JARDIANCE) 10 MG tablet Take 1 tablet (10 mg) by mouth daily 90 tablet 3    furosemide (LASIX) 20 MG tablet Take 1 tablet (20 mg) by mouth daily 90 tablet 0    memantine (NAMENDA) 10 MG tablet Take 2 tablets (20 mg total) by mouth every evening 180 tablet 0    Multiple Vitamin (multivitamin) capsule Take 1 capsule by mouth daily      rosuvastatin (CRESTOR) 10 MG tablet Take 1 tablet (10 mg) by mouth daily 90 tablet 0    sacubitril-valsartan (ENTRESTO) 24-26 MG Tab per tablet Take 0.5 tablets by mouth 2 (two) times daily 90 tablet 3    spironolactone (ALDACTONE) 25 MG tablet Take 0.5 tablets (12.5 mg) by mouth daily 90 tablet 0    tamsulosin (FLOMAX) 0.4 MG Cap Take 1 capsule (0.4 mg) by mouth nightly      vitamin D (CHOLECALCIFEROL) 25 MCG (1000 UT) tablet Take 2 tablets (2,000 Units) by mouth Once every Monday, Wednesday and Friday morning         Past History     Past Medical History:  Past Medical History:   Diagnosis Date    Atrial enlargement, bilateral Echo result 04/04/19    CAD (coronary artery disease) 05/28/19 Cath results.    Severe 3V CAD.  Patent LIMA to LAD,  patent SVG to OM 1 and OM 2 of circumflex CA, & patent SVG to RCA.    Chronic combined systolic and diastolic heart failure 310/21 OV Dr. Penny Pia    EF=30%    Coronary artery disease with history of myocardial infarction without history of CABG 05/23/2018    Dementia 04/2014    Difficulty walking 06/2016    Post surgery of lt knee, rt femur    Dyslipidemia 310/21 OV Dr. Penny Pia    Dyspnea on exertion 02/2018    HTN (hypertension) 310/21 OV Dr. Penny Pia    Mild cognitive impairment 310/21 OV Dr. Penny Pia    Primary cardiomyopathy 11/2017    RBBB 07/10/19 EKg result    S/P CABG (coronary artery bypass graft)      LIMA to LAD,  SVG to OM 1 & OM 2 of circumflex CA, & SVG to RCA.    Stented coronary artery     VTE (venous thromboembolism) Feb 2023    In lungs, combined with bronchitis       Past Surgical History:  Past Surgical History:   Procedure Laterality Date    CARDIAC VALVE REPLACEMENT  12 Jul 2019    Atrial valve replacement    CORONARY ARTERY BYPASS GRAFT  2003     approx year: LIMA to LAD,  SVG to OM 1 & OM 2 of circumflex CA, & SVG to RCA    FRACTURE SURGERY Right 06/2016    femur    HERNIORRHAPHY, INGUINAL Right 04/29/2021    Procedure: OPEN RIGHT HERNIORRHAPHY, INGUINAL WITH MESH;  Surgeon: Towanda Octave, MD;  Location: [facility];  Service: General;  Laterality: Right;    PERCUTANEOUS VALVULOPLASTY - AORTIC N/A 07/12/2019    Procedure: TAVR #2;  Surgeon: Arlyss Queen, MD PhD;  Location: [facility];  Service: Cardiovascular;  Laterality: N/A;  23 HR OB    REPLACEMENT, AORTIC VALVE , EDWARDS SAPIEN N/A 07/12/2019    Procedure: REPLACEMENT, AORTIC VALVE , EDWARDS SAPIEN -- TAVR Valve:   S3 29mm -- Access: RTF -- IC: Dr. Penny Pia;  Surgeon: Christene Lye, MD;  Location: [facility];  Service: Cardiothoracic;  Laterality: N/A;    RIGHT & LEFT HEART CATH POSSIBLE PCI N/A 05/26/2019    Procedure: RIGHT & LEFT HEART CATH POSSIBLE PCI;  Surgeon: Arlyss Queen, MD PhD;  Location: [facility];   Service: Cardiovascular;  Laterality: N/A;  [id number] scheduled with Maira in dr's office. OP. kr    TAVR PERCUTANEOUS FEMORAL  07/13/2019         TOTAL KNEE ARTHROPLASTY Left 2017    TOTAL KNEE ARTHROPLASTY Right 2005       Family History:  Family History   Problem Relation Age of Onset    No known problems Mother     No known problems Father     No known problems Sister     No known problems Brother        Social History:  Social History     Tobacco Use    Smoking status: Never    Smokeless tobacco: Never   Vaping Use    Vaping Use: Never used   Substance Use Topics    Alcohol use: Not Currently    Drug use: Never       Allergies:  No Known Allergies    Review of Systems     Review of Systems    Physical Exam   BP 146/62   Pulse (!) 53   Temp 98.4 F (36.9 C) (Oral)   Resp 16   Wt 74.8 kg   SpO2 100%   BMI 25.06 kg/m     Physical Exam  Vitals and nursing note reviewed.   Constitutional:       General: He is in acute distress.      Appearance: He is well-developed.      Comments: Frail. elderly   HENT:      Head: Normocephalic and atraumatic.   Eyes:      Pupils: Pupils are equal, round, and reactive to light.   Cardiovascular:      Rate and Rhythm: Normal rate and regular rhythm.      Heart sounds: Normal heart sounds.   Pulmonary:      Effort: Pulmonary effort is normal.      Breath sounds: Normal breath sounds. No wheezing.      Comments: Diminished BS at the bases bilatarlly  Abdominal:      Palpations: Abdomen is soft.      Tenderness: There is no  abdominal tenderness.   Musculoskeletal:         General: Normal range of motion.      Cervical back: Normal range of motion and neck supple.      Right lower leg: Edema present.      Left lower leg: Edema present.   Skin:     General: Skin is warm and dry.   Neurological:      Mental Status: He is alert and oriented to person, place, and time.   Psychiatric:         Behavior: Behavior normal.         Diagnostic Study Results     Labs -     Results        Procedure Component Value Units Date/Time    Cell MorpHology [[id number]]  (Abnormal) Collected: 03/12/22 1413     Updated: 03/12/22 1537     Cell Morphology Normal     Platelet Estimate Decreased     Platelet Clumps NONE SEEN    CBC and differential [[id number]]  (Abnormal) Collected: 03/12/22 1413    Specimen: Blood Updated: 03/12/22 1536     WBC 2.84 x10 3/uL      Hgb 12.0 g/dL      Hematocrit 56.4 %      Platelets 109 x10 3/uL      RBC 3.49 x10 6/uL      MCV 102.6 fL      MCH 34.4 pg      MCHC 33.5 g/dL      RDW 13 %      MPV 11.8 fL      Instrument Absolute Neutrophil Count 1.01 x10 3/uL      Neutrophils 35.5 %      Lymphocytes Automated 44.4 %      Monocytes 15.5 %      Eosinophils Automated 3.9 %      Basophils Automated 0.7 %      Immature Granulocytes 0.0 %      Nucleated RBC 0.0 /100 WBC      Neutrophils Absolute 1.01 x10 3/uL      Lymphocytes Absolute Automated 1.26 x10 3/uL      Monocytes Absolute Automated 0.44 x10 3/uL      Eosinophils Absolute Automated 0.11 x10 3/uL      Basophils Absolute Automated 0.02 x10 3/uL      Immature Granulocytes Absolute 0.00 x10 3/uL      Absolute NRBC 0.00 x10 3/uL     NT-proBNP [[id number]]  (Abnormal) Collected: 03/12/22 1413     Updated: 03/12/22 1515     NT-proBNP 933 pg/mL     Narrative:      [place] [id number] at 03/12/2022 14:34 Reason: Patient unavailable    Urinalysis Reflex to Microscopic Exam- Reflex to Culture [[id number]]  (Abnormal) Collected: 03/12/22 1436    Specimen: Urine, Clean Catch Updated: 03/12/22 1457     Urine Type Urine, Clean Ca     Color, UA Straw     Clarity, UA Clear     Specific Gravity UA 1.013     Urine pH 6.0     Leukocyte Esterase, UA Negative     Nitrite, UA Negative     Protein, UR Negative     Glucose, UA 500= 3+     Ketones UA Negative     Urobilinogen, UA Normal mg/dL      Bilirubin, UA Negative     Blood, UA Negative    Comprehensive metabolic panel [[id number]]  (  Abnormal) Collected: 03/12/22 1413    Specimen: Blood Updated:  03/12/22 1448     Glucose 81 mg/dL      BUN 76.1 mg/dL      Creatinine 1.4 mg/dL      Sodium 950 mEq/L      Potassium 4.7 mEq/L      Chloride 111 mEq/L      CO2 28 mEq/L      Calcium 9.7 mg/dL      Protein, Total 7.5 g/dL      Albumin 3.5 g/dL      AST (SGOT) 18 U/L      ALT 11 U/L      Alkaline Phosphatase 63 U/L      Bilirubin, Total 0.7 mg/dL      Globulin 4.0 g/dL      Albumin/Globulin Ratio 0.9     Anion Gap 6.0     eGFR 46.2 mL/min/1.73 m2             Radiologic Studies -   Radiology Results (24 Hour)       Procedure Component Value Units Date/Time    XR Chest AP Portable [[id number]] Collected: 03/12/22 1605    Order Status: Completed Updated: 03/12/22 1608    Narrative:      PORTABLE CHEST    HISTORY: cough.     COMPARISON: The study is compared to a prior performed on 02/17/2022.     FINDINGS: Mild streaky consolidation is noted of the medial lung bases.  There is moderate elevation of the right hemidiaphragm. The heart is mildly  enlarged. The patient is status post median sternotomy and coronary artery  bypass graft surgery. The patient is status post aortic valve implantation.      Impression:        Streaky consolidation of the medial lung bases which may represent  atelectatic changes or infiltrates.    Fonnie Mu, DO  03/12/2022 4:05 PM        .    Medical Decision Making   I am the first provider for this patient.    I reviewed the vital signs, available nursing notes, past medical history, past surgical history, family history and social history.      Vital Signs-Reviewed the patient's vital signs.   No data found.    Pulse Oximetry Analysis - Normal     Cardiac Monitor: Interpreted by me. Rhythm:  Sinus Bradycardia, Rate:  Bradycardic, Ectopy:  Infrequent PVC's    EKG:  Interpreted by me: Marland Kitchen     Labs: All labs have been ordered, reviewed and interpreted by me.  Xrays: Ordered, reviewed and interpreted by me, confirmed by radiology report.   CT/US/MRI: Ordered and reviewed  by me, confirmed by  radiology report.     Critical Care Time:    Procedures      ED Course/ Provider Note/ MDM: :   Initial differential diagnosis to include but not limited to: viral illness (upper respiratory infection), pneumonia, asthma/COPD, congestive heart failure    ED Course as of 03/16/22 0108   Thu Mar 12, 2022   4316 HPI: 89 year old with history of 5 weeks of cough congestion. PMH sig for coronary artery disease, hypertension, history of PE, CHF presenting with cough congestion for 5 weeks.  No improvement with Mucinex. Pt has not been on antibiotics. No SOB. No chest pains. Ambulates with a walker.  [ST]   1704    Streaky consolidation of the medial lung bases which may represent  atelectatic changes or infiltrates.      [ST]   1704 CBC and differential(!)  Pt with leukopenia and thrombocytopenia. He is followed by hematotology and had an appoinmtent this week.  [ST]   1705 EKG: Sinus bradycardia, rate 57, frequent PVCs, left axis deviation, right bundle branch block, no STEMI. [ST]   1705 Afebrile.  No respiratory distress.  Diminished breath sounds at the bases.  No rales. [ST]      ED Course User Index  [ST] Cherlyn Roberts, MD         Medical Decision Making  Amount and/or Complexity of Data Reviewed  Labs: ordered. Decision-making details documented in ED Course.  Radiology: ordered.  ECG/medicine tests: ordered.    Risk  Prescription drug management.       Shared decision making with patient's son.  Discussed risks versus benefits of hospitalization at this time.  Feel that patient would be more comfortable at home with oral treatment.  Patient is not hypoxic.  No respiratory distress.  Stable vitals.  Patient given prescription for antibiotics.  Patient also told to increase Lasix to 40 mg over the next 3 days and follow-up with primary care provider next week.  Strict return precautions for pneumonia discussed.  Safe for discharge home       Dr. Audley Hose is the primary emergency doctor of  record.    Diagnosis     Clinical Impression:   1. Pneumonia due to infectious organism, unspecified laterality, unspecified part of lung        Treatment Plan:   ED Disposition       ED Disposition   Discharge    Condition   --    Date/Time   Thu Mar 12, 2022  5:02 PM    Comment   Rick [name] discharge to home/self care.    Condition at disposition: Stable                   _______________________________      This note was generated by the Epic EMR system/ Dragon speech recognition and may contain inherent errors or omissions not intended by the user. Grammatical errors, random word insertions, deletions and pronoun errors  are occasional consequences of this technology due to software limitations. Not all errors are caught or corrected. If there are questions or concerns about the content of this note or information contained within the body of this dictation they should be addressed directly with the author for clarification. The use of the ED course in this note was to facilitate documentation. The time stamps of the HPI, ROS, physical exam,EKG interpretation within the ED course reflect the time these elements were documented in the chart, not the time they were physically completed.      _______________________________       Cherlyn Roberts, MD  03/16/22 [phone]

## 2022-03-18 ENCOUNTER — Encounter (INDEPENDENT_AMBULATORY_CARE_PROVIDER_SITE_OTHER): Payer: Self-pay

## 2022-03-30 ENCOUNTER — Telehealth (INDEPENDENT_AMBULATORY_CARE_PROVIDER_SITE_OTHER): Payer: Medicare Other | Admitting: Student in an Organized Health Care Education/Training Program

## 2022-03-30 ENCOUNTER — Encounter (INDEPENDENT_AMBULATORY_CARE_PROVIDER_SITE_OTHER): Payer: Self-pay | Admitting: Student in an Organized Health Care Education/Training Program

## 2022-03-30 ENCOUNTER — Other Ambulatory Visit (INDEPENDENT_AMBULATORY_CARE_PROVIDER_SITE_OTHER): Payer: Self-pay | Admitting: Student in an Organized Health Care Education/Training Program

## 2022-03-30 DIAGNOSIS — G8929 Other chronic pain: Secondary | ICD-10-CM | POA: Insufficient documentation

## 2022-03-30 DIAGNOSIS — I5020 Unspecified systolic (congestive) heart failure: Secondary | ICD-10-CM

## 2022-03-30 DIAGNOSIS — M545 Low back pain, unspecified: Secondary | ICD-10-CM

## 2022-03-30 DIAGNOSIS — J069 Acute upper respiratory infection, unspecified: Secondary | ICD-10-CM | POA: Insufficient documentation

## 2022-03-30 MED ORDER — BENZONATATE 100 MG PO CAPS
100.0000 mg | ORAL_CAPSULE | Freq: Three times a day (TID) | ORAL | 0 refills | Status: DC | PRN
Start: 2022-03-30 — End: 2022-04-30

## 2022-03-30 MED ORDER — EMPAGLIFLOZIN 10 MG PO TABS
10.0000 mg | ORAL_TABLET | Freq: Every day | ORAL | 3 refills | Status: AC
Start: 2022-03-30 — End: ?

## 2022-03-30 NOTE — Progress Notes (Signed)
Patient ID: Rick [name]  is a 89 y.o.  male.     Verbal consent has been obtained from the patient to conduct a video visit encounter to minimize exposure to COVID-19:  Yes     No chief complaint on file.     Back Pain  This is a chronic problem. The current episode started more than 1 year ago. The problem occurs intermittently. The pain is present in the lumbar spine. The pain does not radiate. The symptoms are aggravated by bending and sitting. Pertinent negatives include no fever or numbness. He has tried nothing for the symptoms.   Cough  This is a new problem. Episode onset: more than 2 weeks. The problem has been gradually improving. The cough is Productive of sputum. Pertinent negatives include no chills, fever or shortness of breath. Nothing aggravates the symptoms. Treatments tried: augmentin x10days.       The following portions of the patient's history were reviewed and updated as appropriate: current medications, allergies, past family history, past medical history, past social history, past surgical history and problem list.     Review of Systems   Constitutional:  Negative for chills and fever.   Respiratory:  Positive for cough. Negative for shortness of breath.    Musculoskeletal:  Positive for back pain.   Neurological:  Negative for numbness.        See HPI    OBJECTIVE     Physical exam limited due to pandemic/telemed visit    There were no vitals taken for this visit.    Physical Exam    GENERAL : alert and in no distress   HEENT: ncat  LUNGS: unlabored breathing  PSYCH: Normal mood and affect, linear thought process, appropriate interactions, no SI/hallucinations    PORTABLE CHEST     HISTORY: cough.      COMPARISON: The study is compared to a prior performed on 02/17/2022.      FINDINGS: Mild streaky consolidation is noted of the medial lung bases.  There is moderate elevation of the right hemidiaphragm. The heart is mildly  enlarged. The patient is status post median sternotomy  and coronary artery  bypass graft surgery. The patient is status post aortic valve implantation.     IMPRESSION:      Streaky consolidation of the medial lung bases which may represent  atelectatic changes or infiltrates.     Rick Gibney, DO  03/12/2022 4:05 PM    ASSESSMENT      Rick [name] is a 89 y.o. male with   1. URI with cough and congestion  benzonatate (Tessalon Perles) 100 MG capsule      2. Chronic bilateral low back pain without sciatica               PLAN         URI with cough and congestion  - Mild to moderate URI symptoms, afebrile, status post ABX, reviewed ED precautions for respiratory distress  - Fluids, rest, Tylenol as needed  - Decongestants and/or antitussives as needed  - Viral URI may take up to 2-3wks to run the course, consider abx if persistent     Chronic bilateral low back pain without sciatica  - Encourage home stretching exercise, avoid mod-heavy lifting, prn Tylenol/Salonpas patches    Call if symptoms persist, worsen, or change.  Call with updates/questions/concerns.    F/u PRN    I spent 25 mins during this encounter discussing/counseling/coordinating care for the following medical  problem(s)     Medication list reviewed with patient and updated as indicated.  Risk & Benefits of any new medication(s) were explained to the patient who verbalized understanding & agreed to the treatment plan.  Patient given a printed copy of AVS. Patient verbalized understanding of instructions given.    This note was generated by the Epic EMR system/ Dragon speech recognition and may contain inherent errors or omissions not intended by the user. Grammatical errors, random word insertions, deletions and pronoun errors  are occasional consequences of this technology due to software limitations. Not all errors are caught or corrected. If there are questions or concerns about the content of this note or information contained within the body of this dictation they should be addressed  directly with the author for clarification

## 2022-03-30 NOTE — Assessment & Plan Note (Signed)
-   Mild to moderate URI symptoms, afebrile, status post ABX, reviewed ED precautions for respiratory distress  - Fluids, rest, Tylenol as needed  - Decongestants and/or antitussives as needed  - Viral URI may take up to 2-3wks to run the course, consider abx if persistent

## 2022-03-30 NOTE — Assessment & Plan Note (Signed)
-   Encourage home stretching exercise, avoid mod-heavy lifting, prn Tylenol/Salonpas patches

## 2022-04-03 ENCOUNTER — Other Ambulatory Visit (INDEPENDENT_AMBULATORY_CARE_PROVIDER_SITE_OTHER): Payer: Self-pay | Admitting: Student in an Organized Health Care Education/Training Program

## 2022-04-03 DIAGNOSIS — I5020 Unspecified systolic (congestive) heart failure: Secondary | ICD-10-CM

## 2022-04-03 NOTE — Telephone Encounter (Signed)
MyChart message sent to patient advising Rx available at pharmacy.

## 2022-04-24 ENCOUNTER — Other Ambulatory Visit: Payer: Self-pay

## 2022-04-24 DIAGNOSIS — H25811 Combined forms of age-related cataract, right eye: Secondary | ICD-10-CM | POA: Insufficient documentation

## 2022-04-29 ENCOUNTER — Other Ambulatory Visit: Payer: Self-pay

## 2022-04-29 DIAGNOSIS — H25812 Combined forms of age-related cataract, left eye: Secondary | ICD-10-CM

## 2022-04-30 ENCOUNTER — Encounter (INDEPENDENT_AMBULATORY_CARE_PROVIDER_SITE_OTHER): Payer: Self-pay

## 2022-04-30 ENCOUNTER — Ambulatory Visit (INDEPENDENT_AMBULATORY_CARE_PROVIDER_SITE_OTHER): Payer: Medicare Other

## 2022-04-30 NOTE — PSS Phone Screening (Addendum)
Pre-Anesthesia Evaluation    Pre-op phone visit requested by:   Reason for pre-op phone visit: Patient anticipating RIGHT PHACO W/IOL procedure.     Son reported he will scheduled Pre-op Clearance with PCP ASAP/ [phone] given to have office send once completed./efax sent as well.     Unable to complete DOS Orders r/t medication warning flag.    No orders of the defined types were placed in this encounter.      History of Present Illness/Summary:        Problem List:  Medical Problems       Hospital Problem List  Date Reviewed: 03/30/2022   None        Non-Hospital Problem List  Date Reviewed: 03/30/2022            ICD-10-CM Priority Class Noted    Memory deficits R41.3   05/23/2018    Overview Signed 05/23/2018  9:56 AM by Violet Baldy, MD     Stable on donepezil 20 mg daily         Coronary artery disease with history of myocardial infarction without history of CABG I25.10, I25.2   05/23/2018    Overview Addendum 05/23/2018  9:56 AM by Violet Baldy, MD     Reports history of CABG in 2003, unclear how many vessels, stable on ASA, not on statin/BB, denies chest pain/dyspnea         Vertebrogenic low back pain M54.51   09/28/2018    Overview Addendum 05/01/2020  3:54 PM by Violet Baldy, MD     Recent exacerbation of low back pain, no numbness, chronic generalized lower extremity weakness and unsteady gait, ambulates with walker/cane at home, improved with PT in the past and wants to resume therapy          Nonrheumatic aortic valve stenosis I35.0   12/23/2018    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 I70.45   03/09/2019    Dry cough R05.8   03/09/2019    Aortic valve stenosis, etiology of cardiac valve disease unspecified I35.0   07/12/2019    S/P TAVR (transcatheter aortic valve replacement) Z95.2   08/16/2019    Overview Signed 08/23/2019  1:02 PM by Violet Baldy, MD     Transcatheter Aortic Valve Replacement using a 56mmm Edward Sapien S3 valve via transfemoral approach by Dr. SConchita Paris 07/12/19,  cardiology following, asymptomatic with walking and light household activities, compliant with Plavix         Difficulty in walking, not elsewhere classified R26.2   10/09/2019    Dementia without behavioral disturbance, unspecified dementia type F03.90   10/17/2019    Overview Addendum 05/01/2020  3:55 PM by PViolet Baldy MD     [place] who is supportive, stable on memantine '20mg'$ , no mood disorder/agitation, son noted more frequent episode of memory deficits in the past few months, to establish care with neuology    BP Readings from Last 3 Encounters:   05/01/20 115/60   10/09/19 149/74   08/25/19 115/80             Balance disorder R26.89   06/05/2020    Chronic systolic CHF (congestive heart failure) I50.22   10/23/2020    Overview Addendum 02/17/2022  2:43 PM by PViolet Baldy MD     Last echo EF 25-30% 04/09/2021  Compliant with EDelene Loll spironolactone '25mg'$ , Jardiance '10mg'$   Not taking furosemide  Baseline weight of 146-147lbs,reports weight gain  in the past few weeks  LE edema and DOE    Wt Readings from Last 3 Encounters:   02/17/22 75.8 kg (167 lb)   11/26/21 69.3 kg (152 lb 12.8 oz)   09/23/21 67.6 kg (149 lb)            Acute pulmonary embolism with acute cor pulmonale, unspecified pulmonary embolism type I26.09   04/08/2021    Overview Addendum 02/17/2022  3:48 PM by Violet Baldy, MD     Unprovoked PE, hematology following, s/p AC with Eliquis x 17mo, now off AC    CTA 04/08/21: Segmental and subsegmental pulmonary emboli in the right lower lobe.  Mild straightening of the interventricular septum as well as reflux of  contrast into the intrahepatic IVC, suggesting right heart strain.         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   08/25/2021    Artificial knee joint present Z96.659   02/17/2022    Overview Signed 02/17/2022  1:50 PM by AZollie Pee MA     May 19, 2019 Entered By: PLincoln BrighamComment: Hx of L knee replacement         Benign prostatic hyperplasia N40.0   02/17/2022    Fracture of femur S72.90XA   02/17/2022    Overview Signed 02/17/2022  1:50 PM by AZollie Pee MA     May 19, 2019 Entered By: PLincoln BrighamComment: hx R femur fracture         Hallux valgus (acquired), right foot M20.11   02/17/2022    History of total right knee replacement Z96.651   03/12/2017    Hypertension I10   02/17/2022    IgG monoclonal gammopathy of uncertain significance D47.2   02/17/2022    Loose total knee arthroplasty T84.038A, Z96.659   03/12/2017    Nocturia R35.1   02/17/2022    Old myocardial infarction I25.2   02/17/2022    Osteoarthritis of left knee M17.12   03/12/2017    Other hammer toe(s) (acquired), left foot M20.42   02/17/2022    Other hammer toe(s) (acquired), right foot M20.41   02/17/2022    Other spondylosis with myelopathy, cervical region M47.12   02/17/2022    URI with cough and congestion J06.9   03/30/2022    Chronic bilateral low back pain without sciatica M54.50, G89.29   03/30/2022    Combined forms of age-related cataract of right eye H25.811   04/24/2022    Combined forms of age-related cataract of left eye H25.812   04/29/2022        Medical History   Diagnosis Date    Atrial enlargement, bilateral Echo result 04/04/19    Bilateral cataracts     Son reports pt has the ability to lay flat for 45+min without CP or SOB as reported on 04/30/22 for DOS  05/21/22    CAD (coronary artery disease) 05/28/19 Cath results.    Severe 3V CAD.  Patent LIMA to LAD, patent SVG to OM 1 and OM 2 of circumflex CA, & patent SVG to RCA.    Chronic combined systolic and diastolic heart failure [id number] Dr. DTalmadge Chad  EF=30%    Coronary artery disease with history of myocardial  infarction without history of CABG 05/23/2018    Dementia 04/2014    Difficulty walking 06/2016    Post surgery of lt knee, rt femur/ uses walker and wheelchair for long distances prn    Disorder of prostate     Dyslipidemia 310/21 OV Dr. Talmadge Chad    Dyspnea on exertion 02/2018    occas per son report on 04/30/22, doing better    Hearing loss     mild, no hearing aids needed    HTN (hypertension) 310/21 OV Dr. Talmadge Chad    Son reports unaware of htn dx as reported on 3/114/24    Low back pain     muscular    Mild cognitive impairment 310/21 OV Dr. Talmadge Chad    Primary cardiomyopathy 11/2017    RBBB 07/10/19 EKg result    S/P CABG (coronary artery bypass graft) 1999     LIMA to LAD,  SVG to OM 1 & OM 2 of circumflex CA, & SVG to RCA.    Stented coronary artery     VTE (venous thromboembolism) Feb 2023    In lungs, combined with bronchitis     Past Surgical History:   Procedure Laterality Date    COLONOSCOPY      CORONARY ARTERY BYPASS GRAFT  2003     approx year: LIMA to LAD,  SVG to OM 1 & OM 2 of circumflex CA, & SVG to RCA    FRACTURE SURGERY Right 06/2016    femur    HERNIORRHAPHY, INGUINAL Right 04/29/2021    Procedure: OPEN RIGHT HERNIORRHAPHY, INGUINAL WITH MESH;  Surgeon: Barbee Cough, MD;  Location: [facility];  Service: General;  Laterality: Right;    PERCUTANEOUS VALVULOPLASTY - AORTIC N/A 07/12/2019    Procedure: TAVR #2;  Surgeon: Priscille Heidelberg, MD PhD;  Location: [facility];  Service: Cardiovascular;  Laterality: N/A;  23 HR OB    REPLACEMENT, AORTIC VALVE , EDWARDS SAPIEN N/A 07/12/2019    Procedure: REPLACEMENT, AORTIC VALVE , EDWARDS SAPIEN -- TAVR Valve:   S3 48m -- Access: RTF -- IC: Dr. DTalmadge Chad  Surgeon: SBjorn Loser MD;  [facility];  Service: Cardiothoracic;  Laterality: N/A;    RIGHT & LEFT HEART CATH POSSIBLE PCI N/A 05/26/2019    Procedure: RIGHT & LEFT HEART CATH POSSIBLE PCI;  Surgeon: DPriscille Heidelberg MD PhD;  Location: [facility];  Service:  Cardiovascular;  Laterality: N/A;[id number] with Maira in dr's office. OP. kr    TAVR PERCUTANEOUS FEMORAL  07/13/2019         TOTAL KNEE ARTHROPLASTY Left 2017    TOTAL KNEE ARTHROPLASTY Right 2005        Medication List            Accurate as of April 30, 2022  3:27 PM. Always use your most recent med list.                acetaminophen 500 MG tablet  Take 1 tablet (500 mg) by mouth as needed for Pain  Commonly known as: TYLENOL  Medication Adjustments for Surgery: Take as needed     docusate sodium 100 MG capsule  Take 1 capsule (100 mg) by mouth 2 (two) times daily  Commonly known as: COLACE  Medication Adjustments for Surgery: Take as prescribed     donepezil 5 MG tablet  Take 1 tablet (5 mg) by  mouth daily Gives 2.'5mg'$  daily  Commonly known as: ARICEPT  Medication Adjustments for Surgery: Take as prescribed     empagliflozin 10 MG tablet  Take 1 tablet (10 mg) by mouth daily  Commonly known as: JARDIANCE  Medication Adjustments for Surgery: Stop 3 days before surgery     furosemide 20 MG tablet  Take 1 tablet (20 mg) by mouth daily  Commonly known as: LASIX  Medication Adjustments for Surgery: Hold day of surgery     guaiFENesin 100 MG/5ML oral liquid  Take 10 mLs (200 mg) by mouth as needed  Commonly known as: ROBITUSSIN  Medication Adjustments for Surgery: Take as needed     memantine 10 MG tablet  Take 2 tablets (20 mg total) by mouth every evening  Commonly known as: NAMENDA  Medication Adjustments for Surgery: Take as prescribed  Notes to patient: Takes qhs         multivitamin capsule  Take 1 capsule by mouth daily  Medication Adjustments for Surgery: Hold day of surgery     rosuvastatin 10 MG tablet  Take 1 tablet (10 mg) by mouth daily  Commonly known as: CRESTOR  Medication Adjustments for Surgery: Take as prescribed     saccharomyces boulardii 250 MG capsule  Take 1 capsule (250 mg) by mouth daily  Commonly known as: FLORASTOR  Medication Adjustments for Surgery: Stop 7 days before surgery      sacubitril-valsartan 24-26 MG Tabs per tablet  Take 0.5 tablets by mouth 2 (two) times daily  Commonly known as: ENTRESTO  Medication Adjustments for Surgery: Take as prescribed     spironolactone 25 MG tablet  Take 0.5 tablets (12.5 mg) by mouth daily  Commonly known as: ALDACTONE  Medication Adjustments for Surgery: Take as prescribed     tamsulosin 0.4 MG Caps  Take 1 capsule (0.4 mg) by mouth every evening  Commonly known as: FLOMAX  Medication Adjustments for Surgery: Take as prescribed     UNABLE TO FIND  daily Med Name: Prostate herb supplement  Medication Adjustments for Surgery: Stop 7 days before surgery     vitamin D 25 MCG (1000 UT) tablet  Take 2 tablets (2,000 Units) by mouth Once every Monday, Wednesday and Friday morning  Commonly known as: CHOLECALCIFEROL  Medication Adjustments for Surgery: Take as prescribed            No Known Allergies  Family History   Problem Relation Age of Onset    No known problems Mother     No known problems Father     No known problems Sister     No known problems Brother      Social History     Occupational History    Not on file   Tobacco Use    Smoking status: Never    Smokeless tobacco: Never   Vaping Use    Vaping Use: Never used   Substance and Sexual Activity    Alcohol use: Not Currently    Drug use: Never    Sexual activity: Not on file           Exam Scores:   SDB score           STBUR score       PONV score       MST score  MST Score: 0    PEN-FAST score       Frailty score  CFS Score: 3    CHADsVasc  Visit Vitals  Ht 1.727 m ('5\' 8"'$ )   Wt 70.3 kg (155 lb)   BMI 23.57 kg/m       Recent Labs   CBC (last 180 days) 03/03/22  1434 03/12/22  1413   WBC 3.46 2.84*   RBC 3.81* 3.49*   Hgb 13.1 12.0*   Hematocrit 41.0 35.8*   MCV 107.6* 102.6*   MCH 34.4* 34.4*   MCHC 32.0 33.5   RDW 13 13   Platelets 134* 109*   MPV 11.9 11.8   Nucleated RBC 0.0 0.0   Absolute NRBC 0.00 0.00     Recent Labs   BMP (last 180 days) 03/03/22  1434 03/12/22  1413   Glucose 86  81   BUN 31.0* 26.0   Creatinine 1.4 1.4   Sodium 144 145   Potassium 5.1 4.7   Chloride 109 111   CO2 27 28   Calcium 9.5 9.7   Anion Gap 8.0 6.0   eGFR 46.2* 46.2*         Recent Labs   Other (last 180 days) 03/03/22  1434 03/12/22  1413   Bilirubin, Total 0.8 0.7   ALT 11 11   AST (SGOT) 17 18   Protein, Total 7.9  7.9 7.5   NT-proBNP  --  933*

## 2022-05-04 ENCOUNTER — Other Ambulatory Visit (INDEPENDENT_AMBULATORY_CARE_PROVIDER_SITE_OTHER): Payer: Self-pay | Admitting: Student in an Organized Health Care Education/Training Program

## 2022-05-04 ENCOUNTER — Encounter (INDEPENDENT_AMBULATORY_CARE_PROVIDER_SITE_OTHER): Payer: Self-pay | Admitting: Student in an Organized Health Care Education/Training Program

## 2022-05-04 DIAGNOSIS — F0390 Unspecified dementia without behavioral disturbance: Secondary | ICD-10-CM

## 2022-05-05 NOTE — Telephone Encounter (Signed)
Patient has been getting Namenda from the [place]. MyChart message sent to patient advising to reach out to his provider at the [facility] for this medication.

## 2022-05-05 NOTE — Nursing Progress Note (Signed)
Notification sent to PSS Nav for IP review

## 2022-05-11 ENCOUNTER — Ambulatory Visit (INDEPENDENT_AMBULATORY_CARE_PROVIDER_SITE_OTHER): Payer: Medicare Other | Admitting: Student in an Organized Health Care Education/Training Program

## 2022-05-11 ENCOUNTER — Other Ambulatory Visit (INDEPENDENT_AMBULATORY_CARE_PROVIDER_SITE_OTHER): Payer: Self-pay | Admitting: Student in an Organized Health Care Education/Training Program

## 2022-05-11 ENCOUNTER — Encounter (INDEPENDENT_AMBULATORY_CARE_PROVIDER_SITE_OTHER): Payer: Self-pay | Admitting: Student in an Organized Health Care Education/Training Program

## 2022-05-11 VITALS — BP 110/62 | HR 80 | Temp 98.0°F

## 2022-05-11 DIAGNOSIS — I5022 Chronic systolic (congestive) heart failure: Secondary | ICD-10-CM

## 2022-05-11 DIAGNOSIS — I10 Essential (primary) hypertension: Secondary | ICD-10-CM

## 2022-05-11 DIAGNOSIS — Z01818 Encounter for other preprocedural examination: Secondary | ICD-10-CM

## 2022-05-11 DIAGNOSIS — H259 Unspecified age-related cataract: Secondary | ICD-10-CM

## 2022-05-11 DIAGNOSIS — R600 Localized edema: Secondary | ICD-10-CM

## 2022-05-11 LAB — BASIC METABOLIC PANEL
Anion Gap: 7 (ref 5.0–15.0)
BUN: 33 mg/dL — ABNORMAL HIGH (ref 9.0–28.0)
CO2: 25 mEq/L (ref 17–29)
Calcium: 9.5 mg/dL (ref 7.9–10.2)
Chloride: 110 mEq/L (ref 99–111)
Creatinine: 1.7 mg/dL — ABNORMAL HIGH (ref 0.5–1.5)
Glucose: 103 mg/dL — ABNORMAL HIGH (ref 70–100)
Potassium: 4.4 mEq/L (ref 3.5–5.3)
Sodium: 142 mEq/L (ref 135–145)
eGFR: 36.5 mL/min/{1.73_m2} — AB (ref >=60)

## 2022-05-11 LAB — CBC
Absolute NRBC: 0 10*3/uL (ref 0.00–0.00)
Hematocrit: 37.2 % — ABNORMAL LOW (ref 37.6–49.6)
Hgb: 11.9 g/dL — ABNORMAL LOW (ref 12.5–17.1)
MCH: 33.8 pg — ABNORMAL HIGH (ref 25.1–33.5)
MCHC: 32 g/dL (ref 31.5–35.8)
MCV: 105.7 fL — ABNORMAL HIGH (ref 78.0–96.0)
MPV: 11.8 fL (ref 8.9–12.5)
Nucleated RBC: 0 /100 WBC (ref 0.0–0.0)
Platelets: 90 10*3/uL — ABNORMAL LOW (ref 142–346)
RBC: 3.52 10*6/uL — ABNORMAL LOW (ref 4.20–5.90)
RDW: 13 % (ref 11–15)
WBC: 3.46 10*3/uL (ref 3.10–9.50)

## 2022-05-11 LAB — HEMOLYSIS INDEX(SOFT): Hemolysis Index: 2 Index (ref 0–24)

## 2022-05-11 NOTE — Progress Notes (Signed)
Have you seen any specialists/other providers since your last visit with [place]?    No    Arm preference verified?   Yes, no preference    Health Maintenance Due   Topic Date Due    Medicare Annual Wellness Visit  Never done    Shingrix Vaccine 50+ (1) Never done    COVID-19 Vaccine (4 - 2023-24 season) 10/17/2021

## 2022-05-11 NOTE — Assessment & Plan Note (Signed)
-   At goal BP < 140/90, continue current regimen  - Encourage DASH diet and regular exercise, keep home BP log, call if elevated

## 2022-05-11 NOTE — Progress Notes (Signed)
Subjective:     Rick [name]  is a 89 y.o. male who presents to the office today for a preoperative consultation at the request of surgeon Dr. Tawni Millers who plans on performing cataract surgery on 05/21/22 and 06/04/22. This consultation is requested for the specific conditions prompting preoperative evaluation (i.e. because of potential affect on operative risk): HTN, CHF, CAD. Planned anesthesia:  MAC . The patient has the following known anesthesia issues:  None . Patients bleeding risk: no recent abnormal bleeding, no remote history of abnormal bleeding, and no use of Ca-channel blockers. Patient does not have objections to receiving blood products if needed.    The following portions of the patient's history were reviewed and updated as appropriate: allergies, current medications, past family history, past medical history, past social history, past surgical history, and problem list.    Review of Systems  A comprehensive review of systems was negative.      Objective:      BP 110/62   Pulse 80   Temp 98 F (36.7 C) (Oral)   Wt Readings from Last 3 Encounters:   04/30/22 70.3 kg (155 lb)   03/12/22 74.8 kg (164 lb 12.8 oz)   03/09/22 74.4 kg (164 lb)         General Appearance:    Alert, cooperative, no distress, appears stated age   Head:    Normocephalic, without obvious abnormality, atraumatic   Eyes:    PERRL, conjunctiva/corneas clear, EOM's intact, fundi     benign, both eyes        Ears:    Normal TM's and external ear canals, both ears   Nose:   Nares normal, septum midline, mucosa normal, no drainage    or sinus tenderness   Throat:   Lips, mucosa, and tongue normal; teeth and gums normal   Neck:   Supple, symmetrical, trachea midline, no adenopathy;        thyroid:  No enlargement/tenderness/nodules; no carotid    bruit or JVD   Back:     Symmetric, no curvature, ROM normal, no CVA tenderness   Lungs:     Clear to auscultation bilaterally, respirations unlabored   Chest wall:    No  tenderness or deformity   Heart:    Regular rate and rhythm, S1 and S2 normal, no murmur, rub   or gallop   Abdomen:     Soft, non-tender, bowel sounds active all four quadrants,     no masses, no organomegaly   Extremities:   Extremities normal, atraumatic, no cyanosis or edema   Pulses:   2+ and symmetric all extremities   Skin:   Skin color, texture, turgor normal, no rashes or lesions   Lymph nodes:   Cervical, supraclavicular, and axillary nodes normal   Neurologic:   CNII-XII intact. Normal strength, sensation and reflexes       throughout       Predictors of intubation difficulty:   Morbid obesity? no   Anatomically abnormal facies? no   Prominent incisors? no   Receding mandible? no   Short, thick neck? no   Neck range of motion: normal   Mallampati score: I (soft palate, uvula, fauces, and tonsillar pillars visible)    Cardiographics  ECG 03/13/2022: Sinus bradycardia, PVC, left axis, right bundle branch block, independently reviewed     Assessment:         Known risk factors for perioperative complications: None    Difficulty with intubation is not  anticipated.    Cardiac Risk Estimation: low    1. Preop examination  CBC without differential    Basic Metabolic Panel      2. Primary hypertension  CBC without differential    Basic Metabolic Panel      3. Age-related cataract of both eyes, unspecified age-related cataract type        4. Chronic systolic CHF (congestive heart failure)           Chronic systolic CHF (congestive heart failure)  - Stable, at dry weight, no new symptoms, continue Entresto/spironolactone/Jardiance/furosemide    Hypertension  - At goal BP < 140/90, continue current regimen  - Encourage DASH diet and regular exercise, keep home BP log, call if elevated       Plan:     Rick [name] is  medically cleared for surgery.     1. Preoperative workup as follows ECG, hemoglobin, hematocrit, electrolytes, creatinine, glucose.  2. Change in medication regimen before surgery:  none  .  3. Prophylaxis for cardiac events with perioperative beta-blockers: not indicated.  4. Invasive hemodynamic monitoring perioperatively: not indicated.  5. Deep vein thrombosis prophylaxis postoperatively:regimen to be chosen by surgical team.  6. Surveillance for postoperative MI with ECG immediately postoperatively and on postoperative days 1 and 2 AND troponin levels 24 hours postoperatively and on day 4 or hospital discharge (whichever comes first): not indicated.  7. Other measures:  none

## 2022-05-11 NOTE — Assessment & Plan Note (Signed)
-   Stable, at dry weight, no new symptoms, continue Entresto/spironolactone/Jardiance/furosemide

## 2022-05-12 ENCOUNTER — Encounter: Payer: Self-pay | Admitting: Ophthalmology

## 2022-05-12 NOTE — Telephone Encounter (Signed)
Medication(s) Requested:   furosemide (LASIX) 20 MG tablet   Medication(s) last refilled: Feb 18 2022, Qty 90, Refills 0  Last visit: Feb 17 2022  Patient does not have an upcoming appointment  Last Labs:   Lab Results   Component Value Date    WBC 3.46 05/11/2022    HGB 11.9 (L) 05/11/2022    HCT 37.2 (L) 05/11/2022    PLT 90 (L) 05/11/2022    CHOL 172 01/01/2021    TRIG 67 01/01/2021    HDL 73 01/01/2021    LDL 86 01/01/2021    ALT 11 03/12/2022    AST 18 03/12/2022    NA 142 05/11/2022    K 4.4 05/11/2022    CL 110 05/11/2022    CREAT 1.7 (H) 05/11/2022    BUN 33.0 (H) 05/11/2022    CO2 25 05/11/2022    INR 1.2 (H) 07/10/2019    GLU 103 (H) 05/11/2022    HGBA1C 4.6 07/10/2019

## 2022-05-13 MED ORDER — FUROSEMIDE 20 MG PO TABS
20.0000 mg | ORAL_TABLET | Freq: Every day | ORAL | 1 refills | Status: DC
Start: 2022-05-13 — End: 2022-05-29

## 2022-05-14 ENCOUNTER — Ambulatory Visit (INDEPENDENT_AMBULATORY_CARE_PROVIDER_SITE_OTHER): Payer: Medicare Other

## 2022-05-14 ENCOUNTER — Encounter (INDEPENDENT_AMBULATORY_CARE_PROVIDER_SITE_OTHER): Payer: Self-pay

## 2022-05-14 NOTE — PSS Phone Screening (Addendum)
Pre-Anesthesia Evaluation    Pre-op phone visit requested by:   Reason for pre-op phone visit: Patient anticipating LEFT EXTRACTION, CATARACT, EXTRACAPSULAR, INSERTION INTRAOCULAR LENS procedure.    05/11/22 Pre-op Clearance from PCP Dr. Carleene Cooper.    Unable to complete DOS Orders r/t medication warning flag.      No orders of the defined types were placed in this encounter.      History of Present Illness/Summary:        Problem List:  Medical Problems       Hospital Problem List  Date Reviewed: 05/11/2022   None        Non-Hospital Problem List  Date Reviewed: 05/11/2022            ICD-10-CM Priority Class Noted    Memory deficits R41.3   05/23/2018    Overview Signed 05/23/2018  9:56 AM by Violet Baldy, MD     Stable on donepezil 20 mg daily         Coronary artery disease with history of myocardial infarction without history of CABG I25.10, I25.2   05/23/2018    Overview Addendum 05/23/2018  9:56 AM by Violet Baldy, MD     Reports history of CABG in 2003, unclear how many vessels, stable on ASA, not on statin/BB, denies chest pain/dyspnea         Vertebrogenic low back pain M54.51   09/28/2018    Overview Addendum 05/01/2020  3:54 PM by Violet Baldy, MD     Recent exacerbation of low back pain, no numbness, chronic generalized lower extremity weakness and unsteady gait, ambulates with walker/cane at home, improved with PT in the past and wants to resume therapy          Nonrheumatic aortic valve stenosis I35.0   12/23/2018    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 Conchita Paris, 07/12/19, cardiology following, asymptomatic  with walking and light household activities, compliant with Plavix         Difficulty in walking, not elsewhere classified R26.2   10/09/2019    Dementia without behavioral disturbance, unspecified dementia type F03.90   10/17/2019    Overview Addendum 05/01/2020  3:55 PM by Violet Baldy, MD     Treated by specialist in [place], currently lives with his son who is supportive, stable on memantine 20mg , no mood disorder/agitation, son noted more frequent episode of memory deficits in the past few months, to establish care with neuology    BP Readings from Last 3 Encounters:   05/01/20 115/60   10/09/19 149/74   08/25/19 115/80             Balance disorder R26.89   06/05/2020    Chronic systolic CHF (congestive heart failure) I50.22   10/23/2020    Overview Addendum 05/11/2022  3:02 PM by Violet Baldy, MD     Last echo EF 25-30% 04/09/2021  Compliant with Delene Loll, spironolactone 25mg , Jardiance 10mg   Not taking furosemide  Baseline weight of 146-147lbs,reports weight gain in the past few weeks  LE edema and DOE  Wt Readings from Last 3 Encounters:   04/30/22 70.3 kg (155 lb)   03/12/22 74.8 kg (164 lb 12.8 oz)   03/09/22 74.4 kg (164 lb)              Acute pulmonary embolism with acute cor pulmonale, unspecified pulmonary embolism type I26.09   04/08/2021    Overview Addendum 02/17/2022  3:48 PM by Violet Baldy, MD     Unprovoked PE, hematology following, s/p AC with Eliquis x 64mos, now off AC    CTA 04/08/21: Segmental and subsegmental pulmonary emboli in the right lower lobe.  Mild straightening of the interventricular septum as well as reflux of  contrast into the intrahepatic IVC, suggesting right heart strain.         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   08/25/2021    Artificial knee joint present Z96.659   02/17/2022    Overview Signed 02/17/2022  1:50 PM by Zollie Pee, MA     May 19, 2019 Entered By: Lincoln Brigham Comment: Hx of L knee replacement         Benign prostatic hyperplasia N40.0   02/17/2022    Fracture of femur S72.90XA   02/17/2022    Overview Signed 02/17/2022  1:50 PM by Zollie Pee, MA     May 19, 2019 Entered By: Lincoln Brigham Comment: hx R femur fracture         Hallux valgus (acquired), right foot M20.11   02/17/2022    History of total right knee replacement Z96.651   03/12/2017    Hypertension I10   02/17/2022    IgG monoclonal gammopathy of uncertain significance D47.2   02/17/2022    Loose total knee arthroplasty [id number], Z96.659   03/12/2017    Nocturia R35.1   02/17/2022    Old myocardial infarction I25.2   02/17/2022    Osteoarthritis of left knee M17.12   03/12/2017    Other hammer toe(s) (acquired), left foot M20.42   02/17/2022    Other hammer toe(s) (acquired), right foot M20.41   02/17/2022    Other spondylosis with myelopathy, cervical region M47.12   02/17/2022    URI with cough and congestion J06.9   03/30/2022    Chronic bilateral low back pain without sciatica M54.50, G89.29   03/30/2022    Combined forms of age-related cataract of right eye H25.811   04/24/2022    Combined forms of age-related cataract of left eye H25.812   04/29/2022        Medical History   Diagnosis Date    Arrhythmia 2021    RBBB    Atrial enlargement, bilateral Echo result 04/04/19    Bilateral cataracts     Son reports pt has the ability to lay flat for 45+min without CP or SOB as reported on 04/30/22 for DOS  05/21/22    CAD (coronary artery disease) 05/28/19 Cath results.    Severe 3V CAD.  Patent LIMA to LAD, patent SVG to OM 1 and OM 2 of circumflex CA, & patent SVG to RCA.    Chronic combined systolic and diastolic heart failure [id number] OV Dr. Talmadge Chad    EF=30%  Dementia 04/2014    Difficulty walking 06/2016    Post surgery of lt  knee, rt femur/ uses walker and wheelchair for long distances prn    Disorder of prostate     Dyslipidemia 310/21 OV Dr. Talmadge Chad    Dyspnea on exertion 02/2018    occas per son report on 04/30/22, doing better    Hearing loss     mild . does not like to wear/ bilat    HTN (hypertension) 310/21 OV Dr. Talmadge Chad    Son reports unaware of htn dx as reported on 3/114/24    Low back pain     muscular    Primary cardiomyopathy 11/2017    Urinary incontinence     occas  2-3 monthly at qhs or in am    VTE (venous thromboembolism) Feb 2023    In lungs, combined with bronchitis     Past Surgical History:   Procedure Laterality Date    COLONOSCOPY      CORONARY ARTERY BYPASS GRAFT  2003     approx year: LIMA to LAD,  SVG to OM 1 & OM 2 of circumflex CA, & SVG to RCA    FRACTURE SURGERY Right 06/2016    femur    HERNIORRHAPHY, INGUINAL Right 04/29/2021    Procedure: OPEN RIGHT HERNIORRHAPHY, INGUINAL WITH MESH;  Surgeon: Barbee Cough, MD;  Location: [facility];  Service: General;  Laterality: Right;    PERCUTANEOUS VALVULOPLASTY - AORTIC N/A 07/12/2019    Procedure: TAVR #2;  Surgeon: Priscille Heidelberg, MD PhD;  Location: [facility];  Service: Cardiovascular;  Laterality: N/A;  23 HR OB    REPLACEMENT, AORTIC VALVE , EDWARDS SAPIEN N/A 07/12/2019    Procedure: REPLACEMENT, AORTIC VALVE , EDWARDS SAPIEN -- TAVR Valve:   S3 88mm -- Access: RTF -- IC: Dr. Talmadge Chad;  Surgeon: Bjorn Loser, MD;  Location: [facility];  Service: Cardiothoracic;  Laterality: N/A;    RIGHT & LEFT HEART CATH POSSIBLE PCI N/A 05/26/2019    Procedure: RIGHT & LEFT HEART CATH POSSIBLE PCI;  Surgeon: Priscille Heidelberg, MD PhD;  Location: [facility];  Service: Cardiovascular;  Laterality: N/[id number] scheduled with Maira in dr's office. OP. kr    TOTAL KNEE ARTHROPLASTY Left 2017    TOTAL KNEE ARTHROPLASTY Right 2005        Medication List            Accurate as of May 14, 2022  1:49 PM. Always use your most recent med list.                 acetaminophen 500 MG tablet  Take 1 tablet (500 mg) by mouth as needed for Pain  Commonly known as: TYLENOL  Medication Adjustments for Surgery: Take as needed     docusate sodium 100 MG capsule  Take 1 capsule (100 mg) by mouth 2 (two) times daily  Commonly known as: COLACE  Medication Adjustments for Surgery: Take as prescribed     donepezil 5 MG tablet  Take 1 tablet (5 mg) by mouth daily Gives 2.5mg  daily  Commonly known as: ARICEPT  Medication Adjustments for Surgery: Take as prescribed     empagliflozin 10 MG tablet  Take 1 tablet (10 mg) by mouth daily  Commonly known as: JARDIANCE  Medication Adjustments for Surgery: Stop 3 days before surgery     furosemide 20 MG tablet  Take 1 tablet (20 mg) by mouth daily  Commonly known as: LASIX  Medication Adjustments for Surgery: Hold day of surgery  Notes to patient: Takes 40 mg daily     guaiFENesin 100 MG/5ML oral liquid  Take 10 mLs (200 mg) by mouth as needed  Commonly known as: ROBITUSSIN  Medication Adjustments for Surgery: Take as needed     memantine 10 MG tablet  Take 2 tablets (20 mg total) by mouth every evening  Commonly known as: NAMENDA  Medication Adjustments for Surgery: Take as prescribed     multivitamin capsule  Take 1 capsule by mouth daily  Medication Adjustments for Surgery: Hold day of surgery     rosuvastatin 10 MG tablet  Take 1 tablet (10 mg) by mouth daily  Commonly known as: CRESTOR  Medication Adjustments for Surgery: Take as prescribed     saccharomyces boulardii 250 MG capsule  Take 1 capsule (250 mg) by mouth daily  Commonly known as: FLORASTOR  Medication Adjustments for Surgery: Stop 7 days before surgery     sacubitril-valsartan 24-26 MG Tabs per tablet  Take 0.5 tablets by mouth 2 (two) times daily  Commonly known as: ENTRESTO  Medication Adjustments for Surgery: Take as prescribed     spironolactone 25 MG tablet  Take 0.5 tablets (12.5 mg) by mouth daily  Commonly known as: ALDACTONE  Medication Adjustments for Surgery: Take  as prescribed     tamsulosin 0.4 MG Caps  Take 1 capsule (0.4 mg) by mouth every evening  Commonly known as: FLOMAX  Medication Adjustments for Surgery: Take as prescribed     UNABLE TO FIND  daily Med Name: Prostate herb supplement  Medication Adjustments for Surgery: Stop 7 days before surgery     vitamin D 25 MCG (1000 UT) tablet  Take 2 tablets (2,000 Units) by mouth Once every Monday, Wednesday and Friday morning  Commonly known as: CHOLECALCIFEROL  Medication Adjustments for Surgery: Hold day of surgery            No Known Allergies  Family History   Problem Relation Age of Onset    No known problems Mother     No known problems Father     No known problems Sister     No known problems Brother      Social History     Occupational History    Not on file   Tobacco Use    Smoking status: Never    Smokeless tobacco: Never   Vaping Use    Vaping status: Never Used   Substance and Sexual Activity    Alcohol use: Never    Drug use: Never    Sexual activity: Not on file           Exam Scores:   SDB score           STBUR score       PONV score       MST score  MST Score: 0    PEN-FAST score       Frailty score  CFS Score: 3    CHADsVasc            Visit Vitals  Ht 1.727 m (5\' 8" )   Wt 70.3 kg (155 lb)   BMI 23.57 kg/m       Recent Labs   CBC (last 180 days) 03/12/22  1413 05/11/22  1457   WBC 2.84* 3.46   RBC 3.49* 3.52*   Hgb 12.0* 11.9*   Hematocrit 35.8* 37.2*   MCV 102.6*  105.7*   MCH 34.4* 33.8*   MCHC 33.5 32.0   RDW 13 13   Platelets 109* 90*   MPV 11.8 11.8   Nucleated RBC 0.0 0.0   Absolute NRBC 0.00 0.00     Recent Labs   BMP (last 180 days) 03/12/22  1413 05/11/22  1457   Glucose 81 103*   BUN 26.0 33.0*   Creatinine 1.4 1.7*   Sodium 145 142   Potassium 4.7 4.4   Chloride 111 110   CO2 28 25   Calcium 9.7 9.5   Anion Gap 6.0 7.0   eGFR 46.2* 36.5*         Recent Labs   Other (last 180 days) 03/03/22  1434 03/12/22  1413   Bilirubin, Total 0.8 0.7   ALT 11 11   AST (SGOT) 17 18   Protein, Total 7.9  7.9  7.5   NT-proBNP  --  933*

## 2022-05-18 ENCOUNTER — Telehealth (INDEPENDENT_AMBULATORY_CARE_PROVIDER_SITE_OTHER): Payer: Self-pay | Admitting: Student in an Organized Health Care Education/Training Program

## 2022-05-18 ENCOUNTER — Other Ambulatory Visit (FREE_STANDING_LABORATORY_FACILITY): Payer: Medicare Other

## 2022-05-18 DIAGNOSIS — I10 Essential (primary) hypertension: Secondary | ICD-10-CM

## 2022-05-18 DIAGNOSIS — I5022 Chronic systolic (congestive) heart failure: Secondary | ICD-10-CM

## 2022-05-18 LAB — BASIC METABOLIC PANEL
Anion Gap: 8 (ref 5.0–15.0)
BUN: 47 mg/dL — ABNORMAL HIGH (ref 9.0–28.0)
CO2: 25 mEq/L (ref 17–29)
Calcium: 9.7 mg/dL (ref 7.9–10.2)
Chloride: 110 mEq/L (ref 99–111)
Creatinine: 1.7 mg/dL — ABNORMAL HIGH (ref 0.5–1.5)
Glucose: 74 mg/dL (ref 70–100)
Potassium: 4.6 mEq/L (ref 3.5–5.3)
Sodium: 143 mEq/L (ref 135–145)
eGFR: 36.5 mL/min/{1.73_m2} — AB (ref >=60)

## 2022-05-18 LAB — HEMOLYSIS INDEX(SOFT): Hemolysis Index: 1 Index (ref 0–24)

## 2022-05-18 NOTE — Telephone Encounter (Addendum)
Per Dr. Devin Going "Results discussed with patient's son, patient had decreased p.o. intake prior to recent labs, has since increased PO fluids, repeat RFT 05/18/22, will clear for surgery if renal function improves"    Called son to verify the patient was coming in for lab work today (05/18/22). Son confirmed and stated he and his father will be heading in soon.

## 2022-05-18 NOTE — Telephone Encounter (Signed)
Pt needs refills of the following medications:   tamsulosin (FLOMAX) 0.4 MG Cap   furosemide (LASIX) 20 MG tablet

## 2022-05-18 NOTE — Telephone Encounter (Signed)
Dr. Mellody Dance, can you please placed lab order for BMP, thanks

## 2022-05-18 NOTE — Telephone Encounter (Signed)
Rpt BMP ordered

## 2022-05-20 NOTE — Telephone Encounter (Signed)
Dr. Mellody Dance reviewed the patient labs that were completed on 05/18/22  to gain clearance for his surgery.     Patients lab work resulted in a BUN at 47, Creatine at 1.7, and the patients GFR at 36.5.     Dr. Mellody Dance stated the patient should delay his cataract surgery for now and follow up the Dr. Devin Going for the patients recent renal changes.     Called son and informed him of the above. Son agreed to have the patient complete a video visit on 05/29/22 with Dr. Devin Going for decreased kidney function. Son verbalized understanding and is agreeable to plan.    Called the surgeon Dr. Michaelene Song Silk's office to inform them the patient will need to reschedule his surgery which was supposed to take place on 05/21/22. The surgeon's office stated they were already on the phone with the son re-scheduling.

## 2022-05-21 ENCOUNTER — Encounter (INDEPENDENT_AMBULATORY_CARE_PROVIDER_SITE_OTHER): Payer: Self-pay | Admitting: Student in an Organized Health Care Education/Training Program

## 2022-05-21 ENCOUNTER — Encounter: Admission: RE | Payer: Self-pay | Source: Ambulatory Visit

## 2022-05-21 DIAGNOSIS — H25811 Combined forms of age-related cataract, right eye: Secondary | ICD-10-CM | POA: Insufficient documentation

## 2022-05-21 SURGERY — EXTRACTION, CATARACT, EXTRACAPSULAR, INSERTION INTRAOCULAR LENS
Anesthesia: Monitor Anesthesia Care | Site: Eye | Laterality: Right

## 2022-05-29 ENCOUNTER — Encounter (INDEPENDENT_AMBULATORY_CARE_PROVIDER_SITE_OTHER): Payer: Self-pay | Admitting: Student in an Organized Health Care Education/Training Program

## 2022-05-29 ENCOUNTER — Telehealth (INDEPENDENT_AMBULATORY_CARE_PROVIDER_SITE_OTHER): Payer: Medicare Other | Admitting: Student in an Organized Health Care Education/Training Program

## 2022-05-29 DIAGNOSIS — N1832 Chronic kidney disease, stage 3b: Secondary | ICD-10-CM

## 2022-05-29 DIAGNOSIS — R600 Localized edema: Secondary | ICD-10-CM

## 2022-05-29 DIAGNOSIS — I5022 Chronic systolic (congestive) heart failure: Secondary | ICD-10-CM

## 2022-05-29 MED ORDER — FUROSEMIDE 20 MG PO TABS
20.0000 mg | ORAL_TABLET | Freq: Every day | ORAL | 1 refills | Status: DC
Start: 2022-05-29 — End: 2022-09-29

## 2022-05-29 MED ORDER — MEMANTINE HCL 10 MG PO TABS
20.0000 mg | ORAL_TABLET | Freq: Every evening | ORAL | 1 refills | Status: DC
Start: 2022-05-29 — End: 2022-08-27

## 2022-05-29 NOTE — Progress Notes (Unsigned)
Have you seen any specialists/other providers since your last visit with [place]?    No      Health Maintenance Due   Topic Date Due    Medicare Annual Wellness Visit  Never done    Shingrix Vaccine 50+ (1) Never done    COVID-19 Vaccine (4 - 2023-24 season) 10/17/2021    FALLS RISK ANNUAL  05/14/2022

## 2022-05-29 NOTE — Progress Notes (Unsigned)
Patient ID: Rick [name]  is a 89 y.o.  male.     Verbal consent has been obtained from the patient to conduct a video visit encounter to minimize exposure to COVID-19:  Yes     Chief Complaint   Patient presents with    Follow-up      HPI    The following portions of the patient's history were reviewed and updated as appropriate: current medications, allergies, past family history, past medical history, past social history, past surgical history and problem list.     Review of Systems     See HPI    OBJECTIVE     Physical exam limited due to pandemic/telemed visit    There were no vitals taken for this visit.    Physical Exam    GENERAL : alert and in no distress   HEENT: ncat  LUNGS: unlabored breathing  PSYCH: Normal mood and affect, linear thought process, appropriate interactions, no SI/hallucinations      ASSESSMENT      Rick [name] is a 89 y.o. male with No diagnosis found.       PLAN         ***    Call if symptoms persist, worsen, or change.  Call with updates/questions/concerns.    F/u PRN    I spent *** mins during this encounter discussing/counseling/coordinating care for the following medical problem(s)     Medication list reviewed with patient and updated as indicated.  Risk & Benefits of any new medication(s) were explained to the patient who verbalized understanding & agreed to the treatment plan.  Patient given a printed copy of AVS. Patient verbalized understanding of instructions given.    This note was generated by the Epic EMR system/ Dragon speech recognition and may contain inherent errors or omissions not intended by the user. Grammatical errors, random word insertions, deletions and pronoun errors  are occasional consequences of this technology due to software limitations. Not all errors are caught or corrected. If there are questions or concerns about the content of this note or information contained within the body of this dictation they should be addressed  directly with the author for clarification

## 2022-06-03 ENCOUNTER — Other Ambulatory Visit (FREE_STANDING_LABORATORY_FACILITY): Payer: Medicare Other

## 2022-06-03 DIAGNOSIS — D472 Monoclonal gammopathy: Secondary | ICD-10-CM

## 2022-06-03 LAB — COMPREHENSIVE METABOLIC PANEL
ALT: 10 U/L (ref 0–55)
AST (SGOT): 21 U/L (ref 5–41)
Albumin/Globulin Ratio: 0.9 (ref 0.9–2.2)
Albumin: 3.7 g/dL (ref 3.5–5.0)
Alkaline Phosphatase: 68 U/L (ref 37–117)
Anion Gap: 6 (ref 5.0–15.0)
BUN: 37 mg/dL — ABNORMAL HIGH (ref 9.0–28.0)
Bilirubin, Total: 0.6 mg/dL (ref 0.2–1.2)
CO2: 27 mEq/L (ref 17–29)
Calcium: 10.1 mg/dL (ref 7.9–10.2)
Chloride: 111 mEq/L (ref 99–111)
Creatinine: 1.4 mg/dL (ref 0.5–1.5)
Globulin: 4.2 g/dL — ABNORMAL HIGH (ref 2.0–3.6)
Glucose: 86 mg/dL (ref 70–100)
Potassium: 4.6 mEq/L (ref 3.5–5.3)
Protein, Total: 7.9 g/dL (ref 6.0–8.3)
Sodium: 144 mEq/L (ref 135–145)
eGFR: 46.1 mL/min/{1.73_m2} — AB (ref >=60)

## 2022-06-03 LAB — CBC AND DIFFERENTIAL
Absolute NRBC: 0 10*3/uL (ref 0.00–0.00)
Basophils Absolute Automated: 0.01 10*3/uL (ref 0.00–0.08)
Basophils Automated: 0.3 %
Eosinophils Absolute Automated: 0.08 10*3/uL (ref 0.00–0.44)
Eosinophils Automated: 2.6 %
Hematocrit: 38.9 % (ref 37.6–49.6)
Hgb: 12.6 g/dL (ref 12.5–17.1)
Immature Granulocytes Absolute: 0.01 10*3/uL (ref 0.00–0.07)
Immature Granulocytes: 0.3 %
Instrument Absolute Neutrophil Count: 1.34 10*3/uL (ref 1.10–6.33)
Lymphocytes Absolute Automated: 1.25 10*3/uL (ref 0.42–3.22)
Lymphocytes Automated: 40.8 %
MCH: 34.1 pg — ABNORMAL HIGH (ref 25.1–33.5)
MCHC: 32.4 g/dL (ref 31.5–35.8)
MCV: 105.4 fL — ABNORMAL HIGH (ref 78.0–96.0)
MPV: 12.8 fL — ABNORMAL HIGH (ref 8.9–12.5)
Monocytes Absolute Automated: 0.37 10*3/uL (ref 0.21–0.85)
Monocytes: 12.1 %
Neutrophils Absolute: 1.34 10*3/uL (ref 1.10–6.33)
Neutrophils: 43.9 %
Nucleated RBC: 0 /100 WBC (ref 0.0–0.0)
Platelets: 79 10*3/uL — ABNORMAL LOW (ref 142–346)
RBC: 3.69 10*6/uL — ABNORMAL LOW (ref 4.20–5.90)
RDW: 13 % (ref 11–15)
WBC: 3.06 10*3/uL — ABNORMAL LOW (ref 3.10–9.50)

## 2022-06-03 LAB — HEMOLYSIS INDEX(SOFT): Hemolysis Index: 22 Index (ref 0–24)

## 2022-06-03 LAB — IMMUNOGLOBULINS IGG, IGA, IGM, QUANTITATIVE
Immunoglobulin A: 210 mg/dL (ref 101–645)
Immunoglobulin G: 2522 mg/dL — ABNORMAL HIGH (ref 540–1822)
Immunoglobulin M: 187 mg/dL (ref 22–293)

## 2022-06-04 ENCOUNTER — Ambulatory Visit: Admit: 2022-06-04 | Payer: Medicare Other | Admitting: Ophthalmology

## 2022-06-04 ENCOUNTER — Encounter (INDEPENDENT_AMBULATORY_CARE_PROVIDER_SITE_OTHER): Payer: Self-pay | Admitting: Student in an Organized Health Care Education/Training Program

## 2022-06-04 DIAGNOSIS — H25812 Combined forms of age-related cataract, left eye: Secondary | ICD-10-CM | POA: Insufficient documentation

## 2022-06-04 DIAGNOSIS — N1832 Chronic kidney disease, stage 3b: Secondary | ICD-10-CM | POA: Insufficient documentation

## 2022-06-04 LAB — SERUM PROTEIN ELECTROPHORESIS
Albumin %: 44.9 % — ABNORMAL LOW (ref 46.6–62.6)
Albumin: 3.3 g/dL — ABNORMAL LOW (ref 3.4–4.8)
Alpha-1 Glob %: 2.8 % (ref 1.7–4.1)
Alpha-1 Globulin: 0.2 g/dL (ref 0.1–0.4)
Alpha-2 Glob %: 9.5 % (ref 8.9–14.9)
Alpha-2 Globulin: 0.7 g/dL — ABNORMAL LOW (ref 0.8–1.2)
Beta Glob %: 10.7 % — ABNORMAL LOW (ref 10.9–18.9)
Beta Globulin: 0.8 g/dL (ref 0.6–1.2)
GM-Spike %: 18.8 % — ABNORMAL HIGH
GM-Spike: 1.4 g/dL — ABNORMAL HIGH
Gamma Globulin %: 32.1 % — ABNORMAL HIGH (ref 9.8–24.4)
Gamma Globulin: 2.4 g/dL — ABNORMAL HIGH (ref 0.6–1.7)
Protein, Total: 7.4 g/dL (ref 6.0–8.3)

## 2022-06-04 LAB — FREE KAPPA AND LAMBDA LIGHT CHAINS WITH RATIO, QUANTITATIVE
Kappa Free Light Chain: 3.55 mg/dL — ABNORMAL HIGH (ref 0.3300–1.94)
Kappa Lambda FLC Ratio: 1.54 (ref 0.2600–1.65)
Lambda Free Light Chain: 2.31 mg/dL (ref 0.5700–2.63)

## 2022-06-04 LAB — LAB USE ONLY - SERUM PROTEIN ELECTROPHORESIS, PATH REVIEW

## 2022-06-04 SURGERY — EXTRACTION, CATARACT, EXTRACAPSULAR, INSERTION INTRAOCULAR LENS
Anesthesia: Monitor Anesthesia Care | Site: Eye | Laterality: Left

## 2022-06-04 NOTE — Assessment & Plan Note (Signed)
-   Stable, daily weights, no new symptoms, continue Entresto/spironolactone/Jardiance/furosemide

## 2022-06-04 NOTE — Assessment & Plan Note (Signed)
-  Continue adequate daily water intake, avoid NSAIDs/contrast, follow-up with nephrology

## 2022-06-10 ENCOUNTER — Other Ambulatory Visit: Payer: Self-pay | Admitting: Hematology & Oncology

## 2022-06-10 ENCOUNTER — Ambulatory Visit: Payer: Medicare Other | Attending: Hematology & Oncology | Admitting: Hematology & Oncology

## 2022-06-10 ENCOUNTER — Encounter: Payer: Self-pay | Admitting: Hematology & Oncology

## 2022-06-10 DIAGNOSIS — I2699 Other pulmonary embolism without acute cor pulmonale: Secondary | ICD-10-CM | POA: Insufficient documentation

## 2022-06-10 DIAGNOSIS — Z7901 Long term (current) use of anticoagulants: Secondary | ICD-10-CM | POA: Insufficient documentation

## 2022-06-10 DIAGNOSIS — D472 Monoclonal gammopathy: Secondary | ICD-10-CM | POA: Insufficient documentation

## 2022-06-10 DIAGNOSIS — D696 Thrombocytopenia, unspecified: Secondary | ICD-10-CM | POA: Insufficient documentation

## 2022-06-10 NOTE — Progress Notes (Signed)
HISTORY OF PRESENT ILLNESS  Rick [name] is 89 y.o. male who comes in for follow up. He has a history of atherosclerosis s/p CABG, aortic stenosis s/p TAVR, vascular dementia, acute renal insufficiency, low back pain and balance disorder. He presented to me for a new finding of bilateral PE on 04/08/21. CTA showed segmental and subsegmental pulmonary emboli in the right lower lobe. There was also mild straightening of the interventricular septum as well as reflux of contrast into the intrahepatic IVC, suggesting right heart strain. Bilateral dopplers did not show evidence of clot. Echo showed severely reduced EF of 25-30%. CT of the abdomen and pelvis in 04/2021 showed small bowel obstruction with transition point at site of new right-sided inguinal hernia. He was started on Eliquis for his clot.      Labs show anemia with hemoglobin of 11.8 and thrombocytopenia with platelets of 70-80. He has mildly elevated creatinine of 1.2-1.5.      He was found to have a monoclonal gammopathy (IgG lambda). MRI testing (06/2021) as well as urine testing and bone marrow biopsy (07/31/21). He does have 8-10 % clonal plasma cells in the marrow with t(4;14) which is high risk. MRI does not show lytic bone lesions however and hemoglobin has improved. There was no evidence of renal failure or hypercalcemia.      Today Rick [name] presents in a wheelchair with his daughter in law. He continues on Eliquis 2.5 mg by mouth twice daily and is tolerating this. He denies fever, chills, night sweats, or sweats. He is sleepy (not new). Back pain is stable and muscular. He denies bleeding or falls.       No Known Allergies  Current Outpatient Medications   Medication Sig Dispense Refill    acetaminophen (TYLENOL) 500 MG tablet Take 1 tablet (500 mg) by mouth as needed for Pain      docusate sodium (COLACE) 100 MG capsule Take 1 capsule (100 mg) by mouth 2 (two) times daily      donepezil (ARICEPT) 5 MG tablet Take 1 tablet (5 mg) by  mouth daily Gives 2.5mg  daily      empagliflozin (JARDIANCE) 10 MG tablet Take 1 tablet (10 mg) by mouth daily 90 tablet 3    furosemide (LASIX) 20 MG tablet Take 1 tablet (20 mg) by mouth daily 90 tablet 1    guaiFENesin (ROBITUSSIN) 100 MG/5ML oral liquid Take 10 mLs (200 mg) by mouth as needed (Patient not taking: Reported on 05/14/2022)      memantine (NAMENDA) 10 MG tablet Take 2 tablets (20 mg) by mouth every evening 180 tablet 1    Multiple Vitamin (multivitamin) capsule Take 1 capsule by mouth daily      rosuvastatin (CRESTOR) 10 MG tablet Take 1 tablet (10 mg) by mouth daily (Patient taking differently: Take 0.5 tablets (5 mg) by mouth daily) 90 tablet 0    saccharomyces boulardii (FLORASTOR) 250 MG capsule Take 1 capsule (250 mg) by mouth daily      sacubitril-valsartan (ENTRESTO) 24-26 MG Tab per tablet Take 0.5 tablets by mouth 2 (two) times daily 90 tablet 3    spironolactone (ALDACTONE) 25 MG tablet Take 0.5 tablets (12.5 mg) by mouth daily 90 tablet 0    tamsulosin (FLOMAX) 0.4 MG Cap Take 1 capsule (0.4 mg) by mouth every evening      UNABLE TO FIND daily Med Name: Prostate herb supplement      vitamin D (CHOLECALCIFEROL) 25 MCG (1000 UT) tablet Take 2 tablets (  2,000 Units) by mouth Once every Monday, Wednesday and Friday morning       No current facility-administered medications for this visit.     Past Medical History:   Diagnosis Date    Arrhythmia 2021    RBBB    Atrial enlargement, bilateral Echo result 04/04/19    Bilateral cataracts     Son reports pt has the ability to lay flat for 45+min without CP or SOB as reported on 05/14/22 for DOS  06/04/22    CAD (coronary artery disease) 05/28/19 Cath results.    Severe 3V CAD.  Patent LIMA to LAD, patent SVG to OM 1 and OM 2 of circumflex CA, & patent SVG to RCA.    Chronic combined systolic and diastolic heart failure 310/21 OV Dr. Penny Pia    EF=30%    Dementia 04/2014    Difficulty walking 06/2016    Post surgery of lt knee, rt femur/ uses walker and  wheelchair for long distances prn    Disorder of prostate     Dyslipidemia 310/21 OV Dr. Penny Pia    Dyspnea on exertion 02/2018    occas per son report on 05/14/22, doing better    Hearing loss     mild . does not like to wear/ bilat    HTN (hypertension) 310/21 OV Dr. Penny Pia    Son reports unaware of htn dx as reported on 3/114/24    Low back pain     muscular    Primary cardiomyopathy 11/2017    Urinary incontinence     occas  2-3 monthly at qhs or in am    VTE (venous thromboembolism) Feb 2023    In lungs, combined with bronchitis     Past Surgical History:   Procedure Laterality Date    COLONOSCOPY, DIAGNOSTIC (SCREENING)      CORONARY ARTERY BYPASS GRAFT  2003     approx year: LIMA to LAD,  SVG to OM 1 & OM 2 of circumflex CA, & SVG to RCA    FRACTURE SURGERY Right 06/2016    femur    HERNIORRHAPHY, INGUINAL Right 04/29/2021    Procedure: OPEN RIGHT HERNIORRHAPHY, INGUINAL WITH MESH;  Surgeon: Towanda Octave, MD;  Location: [facility];  Service: General;  Laterality: Right;    PERCUTANEOUS VALVULOPLASTY - AORTIC N/A 07/12/2019    Procedure: TAVR #2;  Surgeon: Arlyss Queen, MD PhD;  Location: [facility];  Service: Cardiovascular;  Laterality: N/A;  23 HR OB    REPLACEMENT, AORTIC VALVE , EDWARDS SAPIEN N/A 07/12/2019    Procedure: REPLACEMENT, AORTIC VALVE , EDWARDS SAPIEN -- TAVR Valve:   S3 29mm -- Access: RTF -- IC: Dr. Penny Pia;  Surgeon: Christene Lye, MD;  Location: [facility] OR;  Service: Cardiothoracic;  Laterality: N/A;    RIGHT & LEFT HEART CATH POSSIBLE PCI N/A 05/26/2019    Procedure: RIGHT & LEFT HEART CATH POSSIBLE PCI;  Surgeon: Arlyss Queen, MD PhD;  Location: [facility];  Service: Cardiovascular;  Laterality: N/A;  [id number] scheduled with Maira in dr's office. OP. kr    TOTAL KNEE ARTHROPLASTY Left 2017    TOTAL KNEE ARTHROPLASTY Right 2005     Family History   Problem Relation Age of Onset    No known problems Mother     No known problems Father     No known problems  Sister     No known problems Brother      Social History  Socioeconomic History    Marital status: Widowed   Tobacco Use    Smoking status: Never    Smokeless tobacco: Never   Vaping Use    Vaping status: Never Used   Substance and Sexual Activity    Alcohol use: Never    Drug use: Never     Social Determinants of Health     Financial Resource Strain: Low Risk  (03/11/2022)    Overall Financial Resource Strain (CARDIA)     Difficulty of Paying Living Expenses: Not hard at all   Food Insecurity: Patient Declined (03/12/2022)    Hunger Vital Sign     Worried About Running Out of Food in the Last Year: Patient declined     Ran Out of Food in the Last Year: Patient declined   Transportation Needs: No Transportation Needs (03/11/2022)    PRAPARE - Therapist, art (Medical): No     Lack of Transportation (Non-Medical): No   Physical Activity: Inactive (03/11/2022)    Exercise Vital Sign     Days of Exercise per Week: 0 days     Minutes of Exercise per Session: 0 min   Stress: No Stress Concern Present (03/11/2022)    [employer] of Occupational Health - Occupational Stress Questionnaire     Feeling of Stress : Not at all   Social Connections: Socially Isolated (03/11/2022)    Social Connection and Isolation Panel [NHANES]     Frequency of Communication with Friends and Family: Once a week     Frequency of Social Gatherings with Friends and Family: Once a week     Attends Religious Services: 1 to 4 times per year     Active Member of [employer] or Organizations: No     Attends Banker Meetings: Never     Marital Status: Widowed   Intimate Partner Violence: Patient Declined (03/12/2022)    Humiliation, Afraid, Rape, and Kick questionnaire     Fear of Current or Ex-Partner: Patient declined     Emotionally Abused: Patient declined     Physically Abused: Patient declined     Sexually Abused: Patient declined   Housing Stability: Low Risk  (03/11/2022)    Housing Stability Vital Sign      Unable to Pay for Housing in the Last Year: No     Number of Places Lived in the Last Year: 1     Unstable Housing in the Last Year: No       REVIEW OF SYSTEMS  Review of Systems   Constitutional:  Positive for malaise/fatigue. Negative for chills and fever.   Respiratory:  Negative for shortness of breath.    Cardiovascular:  Negative for chest pain.   Gastrointestinal:  Negative for abdominal pain.   Musculoskeletal:  Positive for back pain (stable). Negative for falls.   Neurological:  Positive for weakness.        Objective:      Physical Exam  Vitals reviewed.   Constitutional:       Appearance: Normal appearance.   HENT:      Head: Normocephalic.   Eyes:      Conjunctiva/sclera: Conjunctivae normal.   Pulmonary:      Effort: Pulmonary effort is normal.   Musculoskeletal:         General: No swelling.   Neurological:      Mental Status: He is alert and oriented to person, place, and time.   Psychiatric:  Mood and Affect: Mood normal.         Behavior: Behavior normal.        Assessment & Plan:   Rick [name] is a 89 year old male who presents for bilateral PE. He also has anemia with thrombocytopenia.      In workup for anemia he was found to have monoclonal gammopathy (IgG lambda) with M-spike of 1 gm/dL. 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      Bilateral PE. Rick [name] is doing well on 2.5 mg of Eliquis bid. He will continue with this. Blood counts show no anemia but thrombocytopenia worse (79,000). White count also slightly lower. He knows to hold on to support when ambulating to prevent falls. He was advised to watch for bleeding.     2. Anemia. Rick [name] has monoclonal gammopathy (IgG lambda). Hemoglobin and renal function stable. No hypercalcemia.     3. Smoldering myeloma. I reviewed myeloma labs which are mixed. M-spike is stable at 1.4 gm/dL and IgG as well as lambda light chains are stable compared to 3 months ago. No clear anemia or worsening  renal insufficiency/hypercalcemia. Back pain stable. Also unclear that Rick [name] could tolerate chemotherapy with frailty and age 16.      He will continue with observation for now with repeat labs in 3 months time but will let me know if back pain worsens etc.     4. Thrombocytopenia. This is better and may be due to medications- will continue to follow.      5. Disposition. All of Rick [name] questions today were answered to his apparent satisfaction. He knows to call with any further questions or concerns.     Time spent 25 min

## 2022-06-14 ENCOUNTER — Other Ambulatory Visit (INDEPENDENT_AMBULATORY_CARE_PROVIDER_SITE_OTHER): Payer: Self-pay | Admitting: Cardiovascular Disease

## 2022-06-15 DIAGNOSIS — E785 Hyperlipidemia, unspecified: Secondary | ICD-10-CM | POA: Insufficient documentation

## 2022-06-16 ENCOUNTER — Other Ambulatory Visit (INDEPENDENT_AMBULATORY_CARE_PROVIDER_SITE_OTHER): Payer: Self-pay | Admitting: Student in an Organized Health Care Education/Training Program

## 2022-06-16 ENCOUNTER — Encounter (INDEPENDENT_AMBULATORY_CARE_PROVIDER_SITE_OTHER): Payer: Self-pay | Admitting: Student in an Organized Health Care Education/Training Program

## 2022-06-17 ENCOUNTER — Other Ambulatory Visit (INDEPENDENT_AMBULATORY_CARE_PROVIDER_SITE_OTHER): Payer: Self-pay

## 2022-06-17 MED ORDER — SPIRONOLACTONE 25 MG PO TABS
12.5000 mg | ORAL_TABLET | Freq: Every day | ORAL | 0 refills | Status: DC
Start: 2022-06-17 — End: 2022-12-18

## 2022-06-18 ENCOUNTER — Emergency Department: Payer: Medicare Other

## 2022-06-18 ENCOUNTER — Emergency Department
Admission: EM | Admit: 2022-06-18 | Discharge: 2022-06-18 | Disposition: A | Discharge status: Home / Self Care | Payer: Medicare Other | Attending: Student in an Organized Health Care Education/Training Program | Admitting: Student in an Organized Health Care Education/Training Program

## 2022-06-18 DIAGNOSIS — I5042 Chronic combined systolic (congestive) and diastolic (congestive) heart failure: Secondary | ICD-10-CM | POA: Insufficient documentation

## 2022-06-18 DIAGNOSIS — W19XXXA Unspecified fall, initial encounter: Secondary | ICD-10-CM

## 2022-06-18 DIAGNOSIS — S20211A Contusion of right front wall of thorax, initial encounter: Secondary | ICD-10-CM | POA: Insufficient documentation

## 2022-06-18 DIAGNOSIS — Z5189 Encounter for other specified aftercare: Secondary | ICD-10-CM

## 2022-06-18 DIAGNOSIS — W1830XA Fall on same level, unspecified, initial encounter: Secondary | ICD-10-CM | POA: Insufficient documentation

## 2022-06-18 DIAGNOSIS — I110 Hypertensive heart disease with heart failure: Secondary | ICD-10-CM | POA: Insufficient documentation

## 2022-06-18 LAB — COMPREHENSIVE METABOLIC PANEL
ALT: 13 U/L (ref 0–55)
AST (SGOT): 19 U/L (ref 5–41)
Albumin/Globulin Ratio: 0.9 (ref 0.9–2.2)
Albumin: 3.6 g/dL (ref 3.5–5.0)
Alkaline Phosphatase: 70 U/L (ref 37–117)
Anion Gap: 9 (ref 5.0–15.0)
BUN: 43 mg/dL — ABNORMAL HIGH (ref 9.0–28.0)
Bilirubin, Total: 0.7 mg/dL (ref 0.2–1.2)
CO2: 25 mEq/L (ref 17–29)
Calcium: 9.9 mg/dL (ref 7.9–10.2)
Chloride: 111 mEq/L (ref 99–111)
Creatinine: 2 mg/dL — ABNORMAL HIGH (ref 0.5–1.5)
Globulin: 4.2 g/dL — ABNORMAL HIGH (ref 2.0–3.6)
Glucose: 92 mg/dL (ref 70–100)
Potassium: 5.2 mEq/L (ref 3.5–5.3)
Protein, Total: 7.8 g/dL (ref 6.0–8.3)
Sodium: 145 mEq/L (ref 135–145)
eGFR: 30 mL/min/{1.73_m2} — AB (ref >=60)

## 2022-06-18 LAB — ECG 12-LEAD
Atrial Rate: 72 {beats}/min
P Axis: 64 degrees
P-R Interval: 208 ms
Q-T Interval: 452 ms
QRS Duration: 160 ms
QTC Calculation (Bezet): 491 ms
R Axis: 21 degrees
T Axis: 51 degrees
Ventricular Rate: 71 {beats}/min

## 2022-06-18 LAB — CBC AND DIFFERENTIAL
Absolute NRBC: 0 10*3/uL (ref 0.00–0.00)
Basophils Absolute Automated: 0.02 10*3/uL (ref 0.00–0.08)
Basophils Automated: 0.5 %
Eosinophils Absolute Automated: 0.07 10*3/uL (ref 0.00–0.44)
Eosinophils Automated: 1.8 %
Hematocrit: 40 % (ref 37.6–49.6)
Hgb: 12.9 g/dL (ref 12.5–17.1)
Immature Granulocytes Absolute: 0.01 10*3/uL (ref 0.00–0.07)
Immature Granulocytes: 0.3 %
Instrument Absolute Neutrophil Count: 2.38 10*3/uL (ref 1.10–6.33)
Lymphocytes Absolute Automated: 0.99 10*3/uL (ref 0.42–3.22)
Lymphocytes Automated: 25.4 %
MCH: 33.5 pg (ref 25.1–33.5)
MCHC: 32.3 g/dL (ref 31.5–35.8)
MCV: 103.9 fL — ABNORMAL HIGH (ref 78.0–96.0)
MPV: 11.4 fL (ref 8.9–12.5)
Monocytes Absolute Automated: 0.42 10*3/uL (ref 0.21–0.85)
Monocytes: 10.8 %
Neutrophils Absolute: 2.38 10*3/uL (ref 1.10–6.33)
Neutrophils: 61.2 %
Nucleated RBC: 0 /100 WBC (ref 0.0–0.0)
Platelets: 79 10*3/uL — ABNORMAL LOW (ref 142–346)
RBC: 3.85 10*6/uL — ABNORMAL LOW (ref 4.20–5.90)
RDW: 13 % (ref 11–15)
WBC: 3.89 10*3/uL (ref 3.10–9.50)

## 2022-06-18 LAB — URINALYSIS WITH REFLEX TO MICROSCOPIC EXAM - REFLEX TO CULTURE
Bilirubin, UA: NEGATIVE
Blood, UA: NEGATIVE
Ketones UA: NEGATIVE
Leukocyte Esterase, UA: NEGATIVE
Nitrite, UA: NEGATIVE
Specific Gravity UA: 1.017 (ref 1.001–1.035)
Urine pH: 7 (ref 5.0–8.0)
Urobilinogen, UA: NORMAL mg/dL (ref 0.2–2.0)

## 2022-06-18 LAB — MAGNESIUM: Magnesium: 2.3 mg/dL (ref 1.6–2.6)

## 2022-06-18 MED ORDER — ACETAMINOPHEN 325 MG PO TABS
650.0000 mg | ORAL_TABLET | Freq: Once | ORAL | Status: AC
Start: 2022-06-18 — End: 2022-06-18
  Administered 2022-06-18: 650 mg via ORAL
  Filled 2022-06-18: qty 2

## 2022-06-18 MED ORDER — LIDOCAINE 5 % EX PTCH
1.0000 | MEDICATED_PATCH | Freq: Once | CUTANEOUS | Status: DC
Start: 2022-06-18 — End: 2022-06-18
  Administered 2022-06-18: 1 via TRANSDERMAL
  Filled 2022-06-18: qty 1

## 2022-06-18 NOTE — ED Notes (Signed)
Obtaining Manual BP at this time prior to [place]

## 2022-06-18 NOTE — ED Notes (Signed)
Assumed care of pt at this time  Brought to ED by son after pt had an unwitnessed fall while attempting to slide back into bed this AM around 0300  +Blood thinners  Endorses pain on right side of rib cage where pt reportedly landed     PMH: CAD, HTN, Aortic Valve replacement, & Dementia.   Denies: Headstrike, dizziness, n/v, changes in vision, gait, fever, chills, recent illness, changes in medications or recent falls.     Resting in bed at this time // Son at bedside   Cardiac monitor intact, SPO2 99% RA   Bed locked in lowest position, side rails up x2, call bell within reach   Will continue to monitor

## 2022-06-18 NOTE — ED Provider Notes (Addendum)
EMERGENCY DEPARTMENT HISTORY AND PHYSICAL EXAM     None        Date: 06/18/2022  Patient Name: Rick [name]    History of Presenting Illness     Chief Complaint   Patient presents with    Rib pain         Additional History: Rick [name] is a 89 y.o. male presenting to the ED with a fall.  Patient had unwitnessed fall last night around 3am.  History limited due to dementia.  Son reports that he was not with the patient so does not know exactly what happened but thinks that patient was trying to get back from the bathroom and lost his balance trying to get back into bed, fell and struck his chest against the bed.  Patient reports no preceding chest pain, difficulty breathing, chest pressure.  Did not have new/severe or sudden onset headache, vomiting, preceding numbness, tingling, or blurry vision prior to fall.  No history of seizures.  Patient reports he does not remember exactly how he fell but think that he fell forward and struck his chest against the bed frame.  His son was able to come and get him up.  This morning he was still complaining of chest pain in the right lower chest so they came in for evaluation.  Denies any shortness of breath.  No pain anywhere else.  Patient was unsure if he hit his head  Patient does have anticoagulation use.    Son reports that patient was feeling very weak about 4 to 5 days ago and had to rest frequently.  Strength has gradually been getting better since then he has been reengaged with physical therapy and able to ambulate.  No nausea, vomiting, sob, appetite is good, no change in urine or meds    All associated symptoms and pertinent negative seen in review of systems below.    PCP: Erasmo Score, MD  SPECIALISTS:    Current Facility-Administered Medications   Medication Dose Route Frequency Provider Last Rate Last Admin    acetaminophen (TYLENOL) tablet 650 mg  650 mg Oral Once Norva Karvonen, MD        lidocaine (LIDODERM) 5 % 1 patch  1 patch Transdermal  Once Norva Karvonen, MD         Current Outpatient Medications   Medication Sig Dispense Refill    acetaminophen (TYLENOL) 500 MG tablet Take 1 tablet (500 mg) by mouth as needed for Pain      apixaban (ELIQUIS) 2.5 MG Take 1 tablet (2.5 mg) by mouth every 12 (twelve) hours      docusate sodium (COLACE) 100 MG capsule Take 1 capsule (100 mg) by mouth 2 (two) times daily      donepezil (ARICEPT) 5 MG tablet Take 1 tablet (5 mg) by mouth daily Gives 2.5mg  daily      empagliflozin (JARDIANCE) 10 MG tablet Take 1 tablet (10 mg) by mouth daily 90 tablet 3    furosemide (LASIX) 20 MG tablet Take 1 tablet (20 mg) by mouth daily 90 tablet 1    guaiFENesin (ROBITUSSIN) 100 MG/5ML oral liquid Take 10 mLs (200 mg) by mouth as needed (Patient not taking: Reported on 05/14/2022)      memantine (NAMENDA) 10 MG tablet Take 2 tablets (20 mg) by mouth every evening 180 tablet 1    Multiple Vitamin (multivitamin) capsule Take 1 capsule by mouth daily      rosuvastatin (CRESTOR) 10 MG tablet Take 1 tablet (  10 mg) by mouth daily (Patient taking differently: Take 0.5 tablets (5 mg) by mouth daily) 90 tablet 0    saccharomyces boulardii (FLORASTOR) 250 MG capsule Take 1 capsule (250 mg) by mouth daily      sacubitril-valsartan (ENTRESTO) 24-26 MG Tab per tablet Take 0.5 tablets by mouth 2 (two) times daily 90 tablet 3    spironolactone (ALDACTONE) 25 MG tablet Take 0.5 tablets (12.5 mg) by mouth daily 90 tablet 0    tamsulosin (FLOMAX) 0.4 MG Cap Take 1 capsule (0.4 mg) by mouth every evening      UNABLE TO FIND daily Med Name: Prostate herb supplement      vitamin D (CHOLECALCIFEROL) 25 MCG (1000 UT) tablet Take 2 tablets (2,000 Units) by mouth Once every Monday, Wednesday and Friday morning         Past History     I have reviewed Past Medical History, Surgical History, Family History and Social as documented below. Also reviewed current medication history per nursing notes.     Past Medical History:  Past Medical History:   Diagnosis  Date    Arrhythmia 2021    RBBB    Atrial enlargement, bilateral Echo result 04/04/19    Bilateral cataracts     Son reports pt has the ability to lay flat for 45+min without CP or SOB as reported on 05/14/22 for DOS  06/04/22    CAD (coronary artery disease) 05/28/19 Cath results.    Severe 3V CAD.  Patent LIMA to LAD, patent SVG to OM 1 and OM 2 of circumflex CA, & patent SVG to RCA.    Chronic combined systolic and diastolic heart failure 310/21 OV Dr. Penny Pia    EF=30%    Dementia 04/2014    Difficulty walking 06/2016    Post surgery of lt knee, rt femur/ uses walker and wheelchair for long distances prn    Disorder of prostate     Dyslipidemia 310/21 OV Dr. Penny Pia    Dyspnea on exertion 02/2018    occas per son report on 05/14/22, doing better    Hearing loss     mild . does not like to wear/ bilat    HTN (hypertension) 310/21 OV Dr. Penny Pia    Son reports unaware of htn dx as reported on 3/114/24    Low back pain     muscular    Primary cardiomyopathy 11/2017    Urinary incontinence     occas  2-3 monthly at qhs or in am    VTE (venous thromboembolism) Feb 2023    In lungs, combined with bronchitis       Past Surgical History:  Past Surgical History:   Procedure Laterality Date    COLONOSCOPY, DIAGNOSTIC (SCREENING)      CORONARY ARTERY BYPASS GRAFT  2003     approx year: LIMA to LAD,  SVG to OM 1 & OM 2 of circumflex CA, & SVG to RCA    FRACTURE SURGERY Right 06/2016    femur    HERNIORRHAPHY, INGUINAL Right 04/29/2021    Procedure: OPEN RIGHT HERNIORRHAPHY, INGUINAL WITH MESH;  Surgeon: Towanda Octave, MD;  Location: [facility];  Service: General;  Laterality: Right;    PERCUTANEOUS VALVULOPLASTY - AORTIC N/A 07/12/2019    Procedure: TAVR #2;  Surgeon: Arlyss Queen, MD PhD;  Location: [facility];  Service: Cardiovascular;  Laterality: N/A;  23 HR OB    REPLACEMENT, AORTIC VALVE , EDWARDS SAPIEN N/A 07/12/2019  Procedure: REPLACEMENT, AORTIC VALVE , EDWARDS SAPIEN -- TAVR Valve:   S3 29mm --  Access: RTF -- IC: Dr. Penny Pia;  Surgeon: Christene Lye, MD;  Location: [name];  Service: Cardiothoracic;  Laterality: N/A;    RIGHT & LEFT HEART CATH POSSIBLE PCI N/A 05/26/2019    Procedure: RIGHT & LEFT HEART CATH POSSIBLE PCI;  Surgeon: Arlyss Queen, MD PhD;  Location: [facility];  Service: Cardiovascular;  Laterality: N/A;  [id number] scheduled with Maira in dr's office. OP. kr    TOTAL KNEE ARTHROPLASTY Left 2017    TOTAL KNEE ARTHROPLASTY Right 2005       Family History:  Family History   Problem Relation Age of Onset    No known problems Mother     No known problems Father     No known problems Sister     No known problems Brother        Social History:  Social History     Tobacco Use    Smoking status: Never    Smokeless tobacco: Never   Vaping Use    Vaping status: Never Used   Substance Use Topics    Alcohol use: Never    Drug use: Never       Allergies:  No Known Allergies    Review of Systems   ROS  Neurological: Negative for numbness, weakness.   Cardiovascular: Negative for chest pain  Respiratory: Negative for shortness of breath  Skin: Negative for wound.   Hematological: Positive for easy bruising    Physical Exam   BP 146/61   Pulse 74   Temp 97.9 F (36.6 C) (Oral)   Resp 18   Ht 5\' 8"  (1.727 m)   Wt 72.6 kg   SpO2 99%   BMI 24.33 kg/m       Constitutional: Alert, well appearing and in no distress.   Head: Normocephalic and atraumatic. No evidence of facial trauma  Mouth/Throat: Oropharynx is clear and moist. No epistaxis or bleeding  Eyes: Conjunctivae normal. Pupils are equal and round.   Nose:  No obvious deformity.  No nasal septal hematoma  Neck: Normal range of motion. Neck supple. No midline C spine tenderness or stepoffs  Cardiovascular: Normal rate, regular rhythm  Pulmonary/Chest: TTP R lower midline chest wall.  No visible signs of injury.  No swelling, ecchymosis, abrasions on chest wall.  Effort normal and breath sounds normal.   Abdominal: Non tender. Soft.  Non distended. No lacerations, ecchymoses, abrasions  Back:  No C/T/L spine tenderness, bony deformities or stepoffs  Musculoskeletal: Pelvis stable and nontender.  No peripheral edema, gross deformity, or tenderness to all extremities.  Neurological: Patient is alert and alert.   Sensation intact. Strength 5/5 in all extremities.   Skin: Skin is warm and dry.  Psychiatric: Affect normal.     Diagnostic Study Results     Labs -     Results       ** No results found for the last 24 hours. **            Radiologic Studies -   Radiology Results (24 Hour)       ** No results found for the last 24 hours. **        .    Medical Decision Making     I Dr. Artis Delay, am the primary provider of record for this patient.   I reviewed the vital signs, available nursing notes, past medical history,  past surgical history, family history and social history.    Old Medical Records Reviewed:   Old records.  Nursing notes.  Old EKG.    MDM:      Rick [name] is a 89 y.o. male with  has a past medical history of Arrhythmia (2021), Atrial enlargement, bilateral (Echo result 04/04/19), Bilateral cataracts, CAD (coronary artery disease) (05/28/19 Cath results.), Chronic combined systolic and diastolic heart failure (310/21 OV Dr. Penny Pia), Dementia (04/2014), Difficulty walking (06/2016), Disorder of prostate, Dyslipidemia (310/21 OV Dr. Penny Pia), Dyspnea on exertion (02/2018), Hearing loss, HTN (hypertension) (310/21 OV Dr. Penny Pia), Low back pain, Primary cardiomyopathy (11/2017), Urinary incontinence, and VTE (venous thromboembolism) (Feb 2023).  presenting today for acute, moderate fall and R chest pain.  Physical exam is notable for TTP chest wall     Notably, further history obtained from:  Outside records reviewed as follows:  Pt's history of the following has impacted care and subsequent medical decision making:     Patient presenting after a fall.  Appears to be consistent with mechanical fall, no high risk features for  syncope, seizure though this was considered in the differential.  Given recent weakness will send labs and get UA.  Analgesia offered and administered as appropriate.  Will evaluate with imaging.  Dispo pending reassessment, imaging results, trial of ambulation.    Patient was treated with the following medications in the ED:  Medications   lidocaine (LIDODERM) 5 % 1 patch (has no administration in time range)   acetaminophen (TYLENOL) tablet 650 mg (has no administration in time range)       See ED course for additional MDM and disposition    ED Course:  Vital Signs-Reviewed the patient's vital signs.   Patient Vitals for the past 12 hrs:   BP Temp Pulse Resp   06/18/22 0857 -- 97.9 F (36.6 C) -- --   06/18/22 0828 146/61 -- 74 18     Pulse Oximetry Analysis - SpO2: 99 % on RA    ED Course as of 06/18/22 1106   Thu Jun 18, 2022   0945 CT Chest without Contrast  IMPRESSION:      1. 1.5 cm hyperdense mass in the medial aspect of the left kidney may  represent a complex cyst or solid mass. Follow-up outpatient pre and  postcontrast CT or pre and postcontrast magnetic resonance imaging would be  useful for full characterization. In a patient of this age management  options would also include a follow-up CT or magnetic resonance imaging in  6-12 months as this appears to been present although slightly smaller  measuring 1.3 cm on CT of 06/16/2019 of the abdomen and pelvis      [BL]   0945 eGFR(!): 30.0  GFR similar to prior a month ago [BL]   1106 Platelet Count(!): 79  Similar to prior [BL]      ED Course User Index  [BL] Norva Karvonen, MD       Tests and imaging independently interpreted as follows:   EKG: Independently interpreted by me, the Emergency Physician as follow: sinus rhythm with frequent PVCs, RBBB.  No stemi or sgarbossa criteria met  Labs: All labs have been ordered, reviewed and interpreted by me as follows:  Xrays: Ordered, reviewed and interpreted by me, confirmed by radiology report as  follows:    Discharge Prescriptions    None             Procedures:    Critical Care  Time:      Diagnosis     Clinical Impression: No diagnosis found.    Treatment Plan:   ED Disposition       None              _______________________________      Attestations: This note is prepared by Norva Karvonen, MD.    This note was generated by the Epic EMR system/ Dragon speech recognition and may contain inherent errors or omissions not intended by the user. Grammatical errors, random word insertions, deletions and pronoun errors  are occasional consequences of this technology due to software limitations. Not all errors are caught or corrected. If there are questions or concerns about the content of this note or information contained within the body of this dictation they should be addressed directly with the author for clarification.    _______________________________     Norva Karvonen, MD  06/18/22 1004       Norva Karvonen, MD  07/11/22 1620

## 2022-06-26 NOTE — Telephone Encounter (Signed)
Medication(s) requested: Tamsulosin 0.4mg   Medication(s) last refilled:   Last Visit: Mar 12,2024  Patient:  Does not have an upcoming appointment   Last Labs:  Lab Results   Component Value Date    WBC 3.89 06/18/2022    HGB 12.9 06/18/2022    HCT 40.0 06/18/2022    PLT 79 (L) 06/18/2022    CHOL 172 01/01/2021    TRIG 67 01/01/2021    HDL 73 01/01/2021    LDL 86 01/01/2021    ALT 13 06/18/2022    AST 19 06/18/2022    NA 145 06/18/2022    K 5.2 06/18/2022    CL 111 06/18/2022    CREAT 2.0 (H) 06/18/2022    BUN 43.0 (H) 06/18/2022    CO2 25 06/18/2022    INR 1.2 (H) 07/10/2019    GLU 92 06/18/2022    HGBA1C 4.6 07/10/2019

## 2022-06-29 MED ORDER — TAMSULOSIN HCL 0.4 MG PO CAPS
0.4000 mg | ORAL_CAPSULE | Freq: Every evening | ORAL | 3 refills | Status: AC
Start: 2022-06-29 — End: ?

## 2022-07-20 ENCOUNTER — Other Ambulatory Visit (INDEPENDENT_AMBULATORY_CARE_PROVIDER_SITE_OTHER): Payer: Self-pay | Admitting: Student in an Organized Health Care Education/Training Program

## 2022-07-22 ENCOUNTER — Encounter (INDEPENDENT_AMBULATORY_CARE_PROVIDER_SITE_OTHER): Payer: Self-pay

## 2022-07-22 MED ORDER — SACUBITRIL-VALSARTAN 24-26 MG PO TABS
0.5000 | ORAL_TABLET | Freq: Two times a day (BID) | ORAL | 3 refills | Status: DC
Start: 2022-07-22 — End: 2022-12-22

## 2022-07-22 NOTE — Progress Notes (Signed)
RN noticed patient is due for Medicare visit. Sent [facility] as a reminder to patient to make an appointment for a Medicare visit.

## 2022-07-22 NOTE — Telephone Encounter (Signed)
Medication(s) Requested: sacubitril-valsartan (ENTRESTO) 24-26 MG Tab   Medication(s) last refilled: May 21 2021, year supply   Last Visit: [name] 2024  Patient:  Rick [name]   Last Labs:  Lab Results   Component Value Date    WBC 3.89 06/18/2022    HGB 12.9 06/18/2022    HCT 40.0 06/18/2022    PLT 79 (L) 06/18/2022    CHOL 172 01/01/2021    TRIG 67 01/01/2021    HDL 73 01/01/2021    LDL 86 01/01/2021    ALT 13 06/18/2022    AST 19 06/18/2022    NA 145 06/18/2022    K 5.2 06/18/2022    CL 111 06/18/2022    CREAT 2.0 (H) 06/18/2022    BUN 43.0 (H) 06/18/2022    CO2 25 06/18/2022    INR 1.2 (H) 07/10/2019    GLU 92 06/18/2022    HGBA1C 4.6 07/10/2019

## 2022-08-18 ENCOUNTER — Encounter (INDEPENDENT_AMBULATORY_CARE_PROVIDER_SITE_OTHER): Payer: Self-pay | Admitting: Student in an Organized Health Care Education/Training Program

## 2022-08-18 NOTE — Progress Notes (Signed)
Patient scheduled for 08/24/22 for pre-op

## 2022-08-24 ENCOUNTER — Ambulatory Visit (INDEPENDENT_AMBULATORY_CARE_PROVIDER_SITE_OTHER): Payer: Medicare Other | Admitting: Student in an Organized Health Care Education/Training Program

## 2022-08-24 ENCOUNTER — Emergency Department: Payer: Medicare Other

## 2022-08-24 ENCOUNTER — Inpatient Hospital Stay
Admission: AD | Admit: 2022-08-24 | Discharge: 2022-08-27 | DRG: 683 | Disposition: A | Discharge status: Home Health | Payer: Medicare Other | Source: Other Acute Inpatient Hospital | Attending: Pediatrics | Admitting: Pediatrics

## 2022-08-24 ENCOUNTER — Encounter (INDEPENDENT_AMBULATORY_CARE_PROVIDER_SITE_OTHER): Payer: Self-pay | Admitting: Student in an Organized Health Care Education/Training Program

## 2022-08-24 ENCOUNTER — Emergency Department
Admission: EM | Admit: 2022-08-24 | Discharge: 2022-08-24 | Disposition: A | Discharge status: Home / Self Care | Payer: Medicare Other | Source: Home / Self Care | Attending: Student in an Organized Health Care Education/Training Program | Admitting: Student in an Organized Health Care Education/Training Program

## 2022-08-24 VITALS — BP 90/40 | Temp 97.0°F

## 2022-08-24 DIAGNOSIS — I110 Hypertensive heart disease with heart failure: Secondary | ICD-10-CM | POA: Diagnosis present

## 2022-08-24 DIAGNOSIS — R051 Acute cough: Secondary | ICD-10-CM

## 2022-08-24 DIAGNOSIS — I5022 Chronic systolic (congestive) heart failure: Secondary | ICD-10-CM

## 2022-08-24 DIAGNOSIS — N179 Acute kidney failure, unspecified: Secondary | ICD-10-CM | POA: Insufficient documentation

## 2022-08-24 DIAGNOSIS — I272 Pulmonary hypertension, unspecified: Secondary | ICD-10-CM | POA: Diagnosis present

## 2022-08-24 DIAGNOSIS — Z951 Presence of aortocoronary bypass graft: Secondary | ICD-10-CM

## 2022-08-24 DIAGNOSIS — E875 Hyperkalemia: Secondary | ICD-10-CM | POA: Diagnosis present

## 2022-08-24 DIAGNOSIS — E785 Hyperlipidemia, unspecified: Secondary | ICD-10-CM | POA: Diagnosis present

## 2022-08-24 DIAGNOSIS — R001 Bradycardia, unspecified: Secondary | ICD-10-CM

## 2022-08-24 DIAGNOSIS — Z7984 Long term (current) use of oral hypoglycemic drugs: Secondary | ICD-10-CM

## 2022-08-24 DIAGNOSIS — F03B0 Unspecified dementia, moderate, without behavioral disturbance, psychotic disturbance, mood disturbance, and anxiety: Secondary | ICD-10-CM | POA: Diagnosis present

## 2022-08-24 DIAGNOSIS — I441 Atrioventricular block, second degree: Secondary | ICD-10-CM

## 2022-08-24 DIAGNOSIS — I9589 Other hypotension: Secondary | ICD-10-CM

## 2022-08-24 DIAGNOSIS — D472 Monoclonal gammopathy: Secondary | ICD-10-CM | POA: Diagnosis present

## 2022-08-24 DIAGNOSIS — Z66 Do not resuscitate: Secondary | ICD-10-CM | POA: Diagnosis present

## 2022-08-24 DIAGNOSIS — H9190 Unspecified hearing loss, unspecified ear: Secondary | ICD-10-CM | POA: Diagnosis present

## 2022-08-24 DIAGNOSIS — E860 Dehydration: Secondary | ICD-10-CM | POA: Diagnosis present

## 2022-08-24 DIAGNOSIS — Z01818 Encounter for other preprocedural examination: Secondary | ICD-10-CM

## 2022-08-24 DIAGNOSIS — I2510 Atherosclerotic heart disease of native coronary artery without angina pectoris: Secondary | ICD-10-CM | POA: Diagnosis present

## 2022-08-24 DIAGNOSIS — Z79899 Other long term (current) drug therapy: Secondary | ICD-10-CM

## 2022-08-24 DIAGNOSIS — I428 Other cardiomyopathies: Secondary | ICD-10-CM | POA: Diagnosis present

## 2022-08-24 DIAGNOSIS — I5042 Chronic combined systolic (congestive) and diastolic (congestive) heart failure: Secondary | ICD-10-CM | POA: Diagnosis present

## 2022-08-24 DIAGNOSIS — Z86711 Personal history of pulmonary embolism: Secondary | ICD-10-CM

## 2022-08-24 DIAGNOSIS — I472 Ventricular tachycardia, unspecified: Secondary | ICD-10-CM | POA: Diagnosis not present

## 2022-08-24 DIAGNOSIS — I5020 Unspecified systolic (congestive) heart failure: Principal | ICD-10-CM

## 2022-08-24 DIAGNOSIS — Z952 Presence of prosthetic heart valve: Secondary | ICD-10-CM

## 2022-08-24 DIAGNOSIS — Z96653 Presence of artificial knee joint, bilateral: Secondary | ICD-10-CM | POA: Diagnosis present

## 2022-08-24 DIAGNOSIS — D696 Thrombocytopenia, unspecified: Secondary | ICD-10-CM | POA: Diagnosis present

## 2022-08-24 LAB — COMPREHENSIVE METABOLIC PANEL
ALT: 11 U/L (ref 0–55)
AST (SGOT): 18 U/L (ref 5–41)
Albumin/Globulin Ratio: 0.9 (ref 0.9–2.2)
Albumin: 3.9 g/dL (ref 3.5–5.0)
Alkaline Phosphatase: 68 U/L (ref 37–117)
Anion Gap: 5 (ref 5.0–15.0)
BUN: 56 mg/dL — ABNORMAL HIGH (ref 9–28)
Bilirubin, Total: 0.6 mg/dL (ref 0.2–1.2)
CO2: 26 mEq/L (ref 17–29)
Calcium: 10.2 mg/dL (ref 7.9–10.2)
Chloride: 109 mEq/L (ref 99–111)
Creatinine: 2.5 mg/dL — ABNORMAL HIGH (ref 0.5–1.5)
GFR: 23.1 mL/min/{1.73_m2} — ABNORMAL LOW (ref >=60.0)
Globulin: 4.4 g/dL — ABNORMAL HIGH (ref 2.0–3.6)
Glucose: 79 mg/dL (ref 70–100)
Potassium: 5.4 mEq/L — ABNORMAL HIGH (ref 3.5–5.3)
Protein, Total: 8.3 g/dL (ref 6.0–8.3)
Sodium: 140 mEq/L (ref 135–145)

## 2022-08-24 LAB — LAB USE ONLY - URINE GRAY CULTURE HOLD TUBE

## 2022-08-24 LAB — LAB USE ONLY - CBC WITH DIFFERENTIAL
Absolute Basophils: 0.01 10*3/uL (ref 0.00–0.08)
Absolute Eosinophils: 0.08 10*3/uL (ref 0.00–0.44)
Absolute Immature Granulocytes: 0 10*3/uL (ref 0.00–0.07)
Absolute Lymphocytes: 1.55 10*3/uL (ref 0.42–3.22)
Absolute Monocytes: 0.47 10*3/uL (ref 0.21–0.85)
Absolute Neutrophils: 1.66 10*3/uL (ref 1.10–6.33)
Absolute nRBC: 0 10*3/uL (ref <=0.00)
Basophils %: 0.3 %
Eosinophils %: 2.1 %
Hematocrit: 38.2 % (ref 37.6–49.6)
Hemoglobin: 12.5 g/dL (ref 12.5–17.1)
Immature Granulocytes %: 0 %
Lymphocytes %: 41.1 %
MCH: 34.7 pg — ABNORMAL HIGH (ref 25.1–33.5)
MCHC: 32.7 g/dL (ref 31.5–35.8)
MCV: 106.1 fL — ABNORMAL HIGH (ref 78.0–96.0)
MPV: 11 fL (ref 8.9–12.5)
Monocytes %: 12.5 %
Neutrophils %: 44 %
Platelet Count: 78 10*3/uL — ABNORMAL LOW (ref 142–346)
Preliminary Absolute Neutrophil Count: 1.66 10*3/uL (ref 1.10–6.33)
RBC: 3.6 10*6/uL — ABNORMAL LOW (ref 4.20–5.90)
RDW: 13 % (ref 11–15)
WBC: 3.77 10*3/uL (ref 3.10–9.50)
nRBC %: 0 /100 WBC (ref <=0.0)

## 2022-08-24 LAB — LAB USE ONLY - URINALYSIS WITH REFLEX TO MICROSCOPIC EXAM AND CULTURE
Urine Bilirubin: NEGATIVE
Urine Blood: NEGATIVE
Urine Ketones: NEGATIVE mg/dL
Urine Leukocyte Esterase: NEGATIVE
Urine Nitrite: NEGATIVE
Urine Protein: NEGATIVE
Urine Specific Gravity: 1.008 (ref 1.001–1.035)
Urine Urobilinogen: NORMAL mg/dL (ref 0.2–2.0)
Urine pH: 6.5 (ref 5.0–8.0)

## 2022-08-24 LAB — ECG 12-LEAD
Atrial Rate: 52 {beats}/min
P Axis: 64 degrees
P-R Interval: 224 ms
Q-T Interval: 472 ms
QRS Duration: 158 ms
QTC Calculation (Bezet): 438 ms
R Axis: -43 degrees
T Axis: -33 degrees
Ventricular Rate: 52 {beats}/min

## 2022-08-24 LAB — COVID-19 (SARS-COV-2) & INFLUENZA  A/B, NAA (ROCHE LIAT)
Influenza A RNA: NOT DETECTED
Influenza B RNA: NOT DETECTED
SARS-CoV-2 (COVID-19) RNA: NOT DETECTED

## 2022-08-24 LAB — WHOLE BLOOD GLUCOSE POCT: Whole Blood Glucose POCT: 84 mg/dL (ref 70–100)

## 2022-08-24 LAB — NT-PROBNP: NT-ProBNP: 548 pg/mL — ABNORMAL HIGH (ref <450)

## 2022-08-24 MED ORDER — EMPAGLIFLOZIN 10 MG PO TABS
10.0000 mg | ORAL_TABLET | Freq: Every day | ORAL | Status: DC
Start: 2022-08-25 — End: 2022-08-27
  Administered 2022-08-25 – 2022-08-27 (×3): 10 mg via ORAL
  Filled 2022-08-24 (×3): qty 1

## 2022-08-24 MED ORDER — DEXTROSE 50 % IV SOLN
12.5000 g | INTRAVENOUS | Status: DC | PRN
Start: 2022-08-24 — End: 2022-08-27

## 2022-08-24 MED ORDER — GLUCOSE 40 % PO GEL (WRAP)
15.0000 g | ORAL | Status: DC | PRN
Start: 2022-08-24 — End: 2022-08-27

## 2022-08-24 MED ORDER — DONEPEZIL HCL 5 MG PO TABS
5.0000 mg | ORAL_TABLET | Freq: Every day | ORAL | Status: DC
Start: 2022-08-25 — End: 2022-08-25
  Administered 2022-08-25: 5 mg via ORAL
  Filled 2022-08-24: qty 1

## 2022-08-24 MED ORDER — TAMSULOSIN HCL 0.4 MG PO CAPS
0.4000 mg | ORAL_CAPSULE | Freq: Every evening | ORAL | Status: DC
Start: 2022-08-24 — End: 2022-08-27
  Administered 2022-08-24 – 2022-08-26 (×3): 0.4 mg via ORAL
  Filled 2022-08-24 (×3): qty 1

## 2022-08-24 MED ORDER — ACETAMINOPHEN 325 MG PO TABS
650.0000 mg | ORAL_TABLET | Freq: Three times a day (TID) | ORAL | Status: DC | PRN
Start: 2022-08-24 — End: 2022-08-27

## 2022-08-24 MED ORDER — GLUCAGON 1 MG IJ SOLR (WRAP)
1.0000 mg | INTRAMUSCULAR | Status: DC | PRN
Start: 2022-08-24 — End: 2022-08-27

## 2022-08-24 MED ORDER — SODIUM CHLORIDE 0.9 % IV BOLUS
500.0000 mL | Freq: Once | INTRAVENOUS | Status: AC
Start: 2022-08-24 — End: 2022-08-24
  Administered 2022-08-24: 500 mL via INTRAVENOUS

## 2022-08-24 MED ORDER — ROSUVASTATIN CALCIUM 5 MG PO TABS
5.0000 mg | ORAL_TABLET | Freq: Every day | ORAL | Status: DC
Start: 2022-08-25 — End: 2022-08-27
  Administered 2022-08-25 – 2022-08-27 (×3): 5 mg via ORAL
  Filled 2022-08-24 (×3): qty 1

## 2022-08-24 MED ORDER — SODIUM CHLORIDE 0.9 % IV SOLN
INTRAVENOUS | Status: DC
Start: 2022-08-24 — End: 2022-08-27

## 2022-08-24 MED ORDER — DOCUSATE SODIUM 100 MG PO CAPS
100.0000 mg | ORAL_CAPSULE | Freq: Two times a day (BID) | ORAL | Status: DC
Start: 2022-08-24 — End: 2022-08-27
  Administered 2022-08-25 – 2022-08-27 (×5): 100 mg via ORAL
  Filled 2022-08-24 (×5): qty 1

## 2022-08-24 MED ORDER — CARBOXYMETHYLCELLULOSE SODIUM 0.5 % OP SOLN
1.0000 [drp] | Freq: Three times a day (TID) | OPHTHALMIC | Status: DC | PRN
Start: 2022-08-24 — End: 2022-08-27

## 2022-08-24 MED ORDER — MEMANTINE HCL 5 MG PO TABS
5.0000 mg | ORAL_TABLET | Freq: Two times a day (BID) | ORAL | Status: DC
Start: 2022-08-25 — End: 2022-08-26
  Administered 2022-08-25 – 2022-08-26 (×3): 5 mg via ORAL
  Filled 2022-08-24 (×3): qty 1

## 2022-08-24 MED ORDER — NALOXONE HCL 0.4 MG/ML IJ SOLN (WRAP)
0.2000 mg | INTRAMUSCULAR | Status: DC | PRN
Start: 2022-08-24 — End: 2022-08-27

## 2022-08-24 MED ORDER — BENZOCAINE-MENTHOL MT LOZG (WRAP)
1.0000 | LOZENGE | OROMUCOSAL | Status: DC | PRN
Start: 2022-08-24 — End: 2022-08-27

## 2022-08-24 MED ORDER — BENZONATATE 100 MG PO CAPS
100.0000 mg | ORAL_CAPSULE | Freq: Three times a day (TID) | ORAL | Status: DC | PRN
Start: 2022-08-24 — End: 2022-08-27

## 2022-08-24 MED ORDER — MEMANTINE HCL 10 MG PO TABS
20.0000 mg | ORAL_TABLET | Freq: Every evening | ORAL | Status: DC
Start: 2022-08-24 — End: 2022-08-24

## 2022-08-24 MED ORDER — ACETAMINOPHEN 500 MG PO TABS
500.0000 mg | ORAL_TABLET | ORAL | Status: DC | PRN
Start: 2022-08-24 — End: 2022-08-24

## 2022-08-24 MED ORDER — ACETAMINOPHEN 325 MG PO TABS
650.0000 mg | ORAL_TABLET | Freq: Four times a day (QID) | ORAL | Status: AC | PRN
Start: 2022-08-24 — End: 2022-08-24

## 2022-08-24 MED ORDER — HEPARIN SODIUM (PORCINE) 5000 UNIT/ML IJ SOLN
5000.0000 [IU] | Freq: Two times a day (BID) | INTRAMUSCULAR | Status: DC
Start: 2022-08-24 — End: 2022-08-27
  Administered 2022-08-24 – 2022-08-26 (×4): 5000 [IU] via SUBCUTANEOUS
  Filled 2022-08-24 (×5): qty 1

## 2022-08-24 MED ORDER — DEXTROSE 10 % IV BOLUS
12.5000 g | INTRAVENOUS | Status: DC | PRN
Start: 2022-08-24 — End: 2022-08-27

## 2022-08-24 MED ORDER — SALINE SPRAY 0.65 % NA SOLN
2.0000 | NASAL | Status: DC | PRN
Start: 2022-08-24 — End: 2022-08-27

## 2022-08-24 MED ORDER — MELATONIN 3 MG PO TABS
3.0000 mg | ORAL_TABLET | Freq: Every evening | ORAL | Status: DC | PRN
Start: 2022-08-24 — End: 2022-08-27
  Administered 2022-08-25: 3 mg via ORAL
  Filled 2022-08-24: qty 1

## 2022-08-24 MED ORDER — SACUBITRIL-VALSARTAN 24-26 MG PO TABS
0.5000 | ORAL_TABLET | Freq: Two times a day (BID) | ORAL | Status: DC
Start: 2022-08-24 — End: 2022-08-27
  Administered 2022-08-25 – 2022-08-27 (×5): 0.5 via ORAL
  Filled 2022-08-24 (×5): qty 1

## 2022-08-24 NOTE — Progress Notes (Signed)
Subjective:     Rick [name]  is a 89 y.o. male who presents to the office today for a preoperative consultation at the request of surgeon Dr. Lewis Shock who plans on performing cataract surgery on 7/18 and 8/1. This consultation is requested for the specific conditions prompting preoperative evaluation (i.e. because of potential affect on operative risk): HTN, CHF, CAD. Planned anesthesia:  MAC . The patient has the following known anesthesia issues:  None . Patients bleeding risk: no recent abnormal bleeding, no remote history of abnormal bleeding, and no use of Ca-channel blockers. Patient does not have objections to receiving blood products if needed.    The following portions of the patient's history were reviewed and updated as appropriate: allergies, current medications, past family history, past medical history, past social history, past surgical history, and problem list.    Review of Systems  A comprehensive review of systems was negative.      Objective:      BP 90/40 (BP Site: Left arm, Patient Position: Sitting, Cuff Size: Medium) Comment: unable to read Comment (BP Site): manual  Temp 97 F (36.1 C) (Tympanic)   Wt Readings from Last 3 Encounters:   08/24/22 72.1 kg (159 lb)   06/18/22 72.6 kg (160 lb)         General Appearance:  Somnolent, no distress, appears stated age   Head:    Normocephalic, without obvious abnormality, atraumatic   Eyes:    PERRL, conjunctiva/corneas clear, EOM's intact, fundi     benign, both eyes        Ears:    Normal TM's and external ear canals, both ears   Nose:   Nares normal, septum midline, mucosa normal, no drainage    or sinus tenderness   Throat:   Lips, mucosa, and tongue normal; teeth and gums normal   Neck:   Supple, symmetrical, trachea midline, no adenopathy;        thyroid:  No enlargement/tenderness/nodules; no carotid    bruit or JVD   Back:     Symmetric, no curvature, ROM normal, no CVA tenderness   Lungs:     Clear to auscultation  bilaterally, respirations unlabored   Chest wall:    No tenderness or deformity   Heart:    Regular rate and rhythm, S1 and S2 normal, no murmur, rub   or gallop   Abdomen:     Soft, non-tender, bowel sounds active all four quadrants,     no masses, no organomegaly   Extremities:   Extremities normal, atraumatic, no cyanosis or edema   Pulses:   2+ and symmetric all extremities   Skin:   Skin color, texture, turgor normal, no rashes or lesions   Lymph nodes:   Cervical, supraclavicular, and axillary nodes normal   Neurologic:   CNII-XII intact. Normal strength, sensation and reflexes       throughout       Predictors of intubation difficulty:   Morbid obesity? no   Anatomically abnormal facies? no   Prominent incisors? no   Receding mandible? no   Short, thick neck? no   Neck range of motion: normal   Mallampati score: I (soft palate, uvula, fauces, and tonsillar pillars visible)    Cardiographics  ECG 08/24/2022: Independently reviewed  SINUS BRADYCARDIA WITH 1ST DEGREE A-V BLOCK FREQUENT PREMATURE VENTRICULAR COMPLEXES IN A PATTERN OF BIGEMINY  LEFT AXIS DEVIATION  RIGHT BUNDLE BRANCH BLOCK  ABNORMAL ECG  WHEN COMPARED WITH ECG OF 18-Jun-2022  09:44,  QRS AXIS SHIFTED LEFT  T WAVE INVERSION MORE EVIDENT IN INFERIOR LEADS  QT HAS SHORTENED  Confirmed by Erasmo Score ([id number]) on 08/24/2022 2:02:04 PM        Assessment:         Known risk factors for perioperative complications: None    Difficulty with intubation is not anticipated.    Cardiac Risk Estimation: low    1. Preop examination  ECG 12 lead      2. Other specified hypotension  -Patient is hypotensive, less alert than usual, sinus bradycardia abnormal EKG changes, recommend further evaluation in the ED, provided in the ED notified  ECG 12 lead      3. Acute cough  Rule out CAP      4. Sinus bradycardia  See #2      5. Chronic systolic CHF (congestive heart failure)  ECG 12 lead             Plan:     Azell Linzie Collin is not cleared for surgery.     1.  Preoperative workup as follows ECG, hemoglobin, hematocrit, electrolytes, creatinine, glucose.  2. Change in medication regimen before surgery:  none .  3. Prophylaxis for cardiac events with perioperative beta-blockers: not indicated.  4. Invasive hemodynamic monitoring perioperatively: not indicated.  5. Deep vein thrombosis prophylaxis postoperatively:regimen to be chosen by surgical team.  6. Surveillance for postoperative MI with ECG immediately postoperatively and on postoperative days 1 and 2 AND troponin levels 24 hours postoperatively and on day 4 or hospital discharge (whichever comes first): not indicated.  7. Other measures:  none

## 2022-08-24 NOTE — ED Notes (Signed)
Pt's son signed POV transfer agreement and ambulance refusal form. [facility]ine Lock secured, pt out of ED via wheelchair accompanied by son.

## 2022-08-24 NOTE — ED Provider Notes (Signed)
EMERGENCY DEPARTMENT HISTORY AND PHYSICAL EXAM     None        Date: 08/24/2022  Patient Name: Rick [name]  Attending Physician: Iva Lento, MD    History of Presenting Illness       History Provided By: Patient's Son    Chief Complaint:  Chief Complaint   Patient presents with    Fatigue     Additional History: Rick [name] is a 89 y.o. male presenting to the ED with fatigue, hypotension. Patient was being seen in PCP office today for pre op evaluation for cataract surgery, was noted to be hypotensive to 90/40, was sent to ED for further evaluation. Patient has been more fatigued over weekend into today. Has had decreased liquid intake recently. Has history of CHF, takes lasix daily. Had cough 2-3 weeks ago, had been improving over time. No fevers, chills.    PCP: Erasmo Score, MD  SPECIALISTS:    No current facility-administered medications for this encounter.     Current Outpatient Medications   Medication Sig Dispense Refill    acetaminophen (TYLENOL) 500 MG tablet Take 1 tablet (500 mg) by mouth as needed for Pain      docusate sodium (COLACE) 100 MG capsule Take 1 capsule (100 mg) by mouth 2 (two) times daily      empagliflozin (JARDIANCE) 10 MG tablet Take 1 tablet (10 mg) by mouth daily 90 tablet 3    furosemide (LASIX) 20 MG tablet Take 1 tablet (20 mg) by mouth daily 90 tablet 1    Multiple Vitamin (multivitamin) capsule Take 1 capsule by mouth daily      rosuvastatin (CRESTOR) 20 MG tablet       sacubitril-valsartan (ENTRESTO) 24-26 MG Tab per tablet Take 0.5 tablets by mouth 2 (two) times daily 90 tablet 3    spironolactone (ALDACTONE) 25 MG tablet Take 0.5 tablets (12.5 mg) by mouth daily 90 tablet 0    tamsulosin (FLOMAX) 0.4 MG Cap Take 1 capsule (0.4 mg) by mouth every evening 90 capsule 3    Vitamin D, Cholecalciferol, 50 MCG (2000 UT) Cap Take 2,000 Units by mouth Once every Monday, Wednesday and Friday morning         Past History     Past Medical History:  Past  Medical History:   Diagnosis Date    Arrhythmia 2021    RBBB    Atrial enlargement, bilateral Echo result 04/04/19    Bilateral cataracts     Son reports pt has the ability to lay flat for 45+min without CP or SOB as reported on 05/14/22 for DOS  06/04/22    CAD (coronary artery disease) 05/28/19 Cath results.    Severe 3V CAD.  Patent LIMA to LAD, patent SVG to OM 1 and OM 2 of circumflex CA, & patent SVG to RCA.    Chronic combined systolic and diastolic heart failure 310/21 OV Dr. Penny Pia    EF=30%    Dementia 04/2014    Difficulty walking 06/2016    Post surgery of lt knee, rt femur/ uses walker and wheelchair for long distances prn    Disorder of prostate     Dyslipidemia 310/21 OV Dr. Penny Pia    Dyspnea on exertion 02/2018    occas per son report on 05/14/22, doing better    Hearing loss     mild . does not like to wear/ bilat    HTN (hypertension) 310/21 OV Dr. Penny Pia    Son reports unaware  of htn dx as reported on 3/114/24    Low back pain     muscular    Primary cardiomyopathy 11/2017    Urinary incontinence     occas  2-3 monthly at qhs or in am    VTE (venous thromboembolism) Feb 2023    In lungs, combined with bronchitis       Past Surgical History:  Past Surgical History:   Procedure Laterality Date    COLONOSCOPY, DIAGNOSTIC (SCREENING)      CORONARY ARTERY BYPASS GRAFT  2003     approx year: LIMA to LAD,  SVG to OM 1 & OM 2 of circumflex CA, & SVG to RCA    FRACTURE SURGERY Right 06/2016    femur    HERNIORRHAPHY, INGUINAL Right 04/29/2021    Procedure: OPEN RIGHT HERNIORRHAPHY, INGUINAL WITH MESH;  Surgeon: Towanda Octave, MD;  Location: [facility];  Service: General;  Laterality: Right;    PERCUTANEOUS VALVULOPLASTY - AORTIC N/A 07/12/2019    Procedure: TAVR #2;  Surgeon: Arlyss Queen, MD PhD;  Location: [facility];  Service: Cardiovascular;  Laterality: N/A;  23 HR OB    REPLACEMENT, AORTIC VALVE , EDWARDS SAPIEN N/A 07/12/2019    Procedure: REPLACEMENT, AORTIC VALVE , EDWARDS SAPIEN --  TAVR Valve:   S3 29mm -- Access: RTF -- IC: Dr. Penny Pia;  Surgeon: Christene Lye, MD;  Location: [facility];  Service: Cardiothoracic;  Laterality: N/A;    RIGHT & LEFT HEART CATH POSSIBLE PCI N/A 05/26/2019    Procedure: RIGHT & LEFT HEART CATH POSSIBLE PCI;  Surgeon: Arlyss Queen, MD PhD;  Location: [facility];  Service: Cardiovascular;  Laterality: N/A;  [id number] scheduled with Maira in dr's office. OP. kr    TOTAL KNEE ARTHROPLASTY Left 2017    TOTAL KNEE ARTHROPLASTY Right 2005       Family History:  Family History   Problem Relation Age of Onset    No known problems Mother     No known problems Father     No known problems Sister     No known problems Brother        Social History:  Social History     Tobacco Use    Smoking status: Never    Smokeless tobacco: Never   Vaping Use    Vaping status: Never Used   Substance Use Topics    Alcohol use: Never    Drug use: Never       Allergies:  No Known Allergies    Review of Systems     Review of Systems   Unable to perform ROS: Dementia   Constitutional:  Positive for fatigue.       Physical Exam     Vitals:    08/24/22 1930 08/24/22 2015 08/24/22 2030 08/24/22 2100   BP: 169/69 197/79 158/71 (!) 143/92   Pulse: (!) 42 78 67 (!) 54   Resp: 15  17 21    Temp:       TempSrc:       SpO2: 100% 98% 98% 94%   Weight:       Height:           Constitutional: Well appearing, no acute distress  HENT: Atraumatic, normocephalic, b/l external ears normal  Eyes: EOMI, no scleral icterus b/l  Mouth: Clear, moist, no erythema, exudate  Cardiovascular: Well perfused, no edema, no cyanosis  Respiratory: No increased work of breathing, no evidence of respiratory  distress  GI: Abdomen non distended, no rebound, guarding  Skin: Dry, warm, no lesions  MSK: No swelling, deformities. ROM intact  Neuro: Alert, oriented, at baseline, no gross neurologic deficit, moving all extremities, GCS 15  Psych: Appropriate mood, behavior, thought process    Diagnostic Study Results      Labs -     Results       Procedure Component Value Units Date/Time    Urinalysis with Reflex to Microscopic Exam and Culture [[id number]]  (Abnormal) Collected: 08/24/22 1605    Specimen: Urine, Clean Catch Updated: 08/24/22 1802    Narrative:      The following orders were created for panel order Urinalysis with Reflex to Microscopic Exam and Culture.  Procedure                               Abnormality         Status                     ---------                               -----------         ------                     Urinalysis with Reflex t.Marland KitchenMarland Kitchen[[id number]]  Abnormal            Final result               Urine [employer] .Marland KitchenMarland Kitchen[[id number]]                      Final result                 Please view results for these tests on the individual orders.    Urine [employer] Tube [[id number]] Collected: 08/24/22 1605    Specimen: Urine, Clean Catch Updated: 08/24/22 1802     Extra Tube Hold for add-ons.    Urinalysis with Reflex to Microscopic Exam and Culture [[id number]]  (Abnormal) Collected: 08/24/22 1605    Specimen: Urine, Clean Catch Updated: 08/24/22 1619     Urine Color Straw     Urine Clarity Clear     Urine Specific Gravity 1.008     Urine pH 6.5     Urine Leukocyte Esterase Negative     Urine Nitrite Negative     Urine Protein Negative     Urine Glucose 200= 2+     Urine Ketones Negative mg/dL      Urine Urobilinogen Normal mg/dL      Urine Bilirubin Negative     Urine Blood Negative    NT-ProBNP [[id number]]  (Abnormal) Collected: 08/24/22 1447    Specimen: Blood, Venous Updated: 08/24/22 1614     NT-ProBNP 548 pg/mL     Comprehensive Metabolic Panel [[id number]]  (Abnormal) Collected: 08/24/22 1447    Specimen: Blood, Venous Updated: 08/24/22 1521     Glucose 79 mg/dL      BUN 56 mg/dL      Creatinine 2.5 mg/dL      Sodium 742 mEq/L      Potassium 5.4 mEq/L      Chloride 109 mEq/L      CO2 26 mEq/L      Calcium 10.2 mg/dL  Anion Gap 5.0     GFR 23.1 mL/min/1.73 m2      AST (SGOT) 18 U/L       ALT 11 U/L      Alkaline Phosphatase 68 U/L      Albumin 3.9 g/dL      Protein, Total 8.3 g/dL      Globulin 4.4 g/dL      Albumin/Globulin Ratio 0.9     Bilirubin, Total 0.6 mg/dL     CBC with Differential [[id number]]  (Abnormal) Collected: 08/24/22 1447    Specimen: Blood, Venous Updated: 08/24/22 1519    Narrative:      The following orders were created for panel order CBC with Differential.  Procedure                               Abnormality         Status                     ---------                               -----------         ------                     CBC with Differential[[id number]]        Abnormal            Final result                 Please view results for these tests on the individual orders.    COVID-19 and Influenza ([place]) (symptomatic) [[id number]]  (Normal) Collected: 08/24/22 1448    Specimen: Swab from Anterior Nares Updated: 08/24/22 1519     SARS-CoV-2 (COVID-19) RNA Not Detected     Influenza A RNA Not Detected     Influenza B RNA Not Detected    Narrative:      A result of "Detected" indicates POSITIVE for the presence of viral RNA  A result of "Not Detected" indicates NEGATIVE for the presence of viral RNA    Test performed using the Roche cobas [place] SARS-CoV-2 & Influenza A/B assay. This is a multiplex real-time RT-PCR assay for the detection of SARS-CoV-2, influenza A, and influenza B virus RNA. Viral nucleic acids may persist in vivo, independent of viability. Detection of viral nucleic acid does not imply the presence of infectious virus, or that virus nucleic acid is the cause of clinical symptoms. Negative results do not preclude SARS-CoV-2, influenza A, and/or influenza B infection and should not be used as the sole basis for diagnosis, treatment or other patient management decisions. Invalid results may be due to inhibiting substances in the specimen and recollection should occur.     CBC with Differential [[id number]]  (Abnormal) Collected: 08/24/22 1447    Specimen: Blood,  Venous Updated: 08/24/22 1519     WBC 3.77 x10 3/uL      Hemoglobin 12.5 g/dL      Hematocrit 01.0 %      Platelet Count 78 x10 3/uL      MPV 11.0 fL      RBC 3.60 x10 6/uL      MCV 106.1 fL      MCH 34.7 pg      MCHC 32.7 g/dL      RDW 13 %  nRBC % 0.0 /100 WBC      Absolute nRBC 0.00 x10 3/uL      Preliminary Absolute Neutrophil Count 1.66 x10 3/uL      Neutrophils % 44.0 %      Lymphocytes % 41.1 %      Monocytes % 12.5 %      Eosinophils % 2.1 %      Basophils % 0.3 %      Immature Granulocytes % 0.0 %      Absolute Neutrophils 1.66 x10 3/uL      Absolute Lymphocytes 1.55 x10 3/uL      Absolute Monocytes 0.47 x10 3/uL      Absolute Eosinophils 0.08 x10 3/uL      Absolute Basophils 0.01 x10 3/uL      Absolute Immature Granulocytes 0.00 x10 3/uL     Whole Blood Glucose POCT [[id number]]  (Normal) Collected: 08/24/22 1440    Specimen: Blood, Capillary Updated: 08/24/22 1442     Whole Blood Glucose POCT 84 mg/dL             Radiologic Studies -   Radiology Results (24 Hour)       Procedure Component Value Units Date/Time    Chest AP Portable [[id number]] Collected: 08/24/22 1518    Order Status: Completed Updated: 08/24/22 1521    Narrative:      HISTORY: Cough, fatigue, hypoxemia    COMPARISON: 03/12/2022    FINDINGS: PA and lateral views of the chest were obtained.   The heart is enlarged and unchanged in size. Median sternotomy and aortic  valve replacement.  The  mediastinum and hilar structures are stable. Aortic calcifications  The lung fields are clear. Chronic elevation of the right hemidiaphragm  minimal chronic blunting of the right CP angle  There is no acute pleural effusion.  No pneumothorax.  The visualized osseous structures demonstrate no acute abnormality.       Impression:       No active disease. Postsurgical changes. Cardiomegaly.     Kinnie Feil, MD  08/24/2022 3:19 PM        .    Medical Decision Making   I am the first provider for this patient.    I reviewed the vital signs, available  nursing notes, past medical history, past surgical history, family history and social history.    Vital Signs-Reviewed the patient's vital signs.     Vitals:    08/24/22 1930 08/24/22 2015 08/24/22 2030 08/24/22 2100   BP: 169/69 197/79 158/71 (!) 143/92   Pulse: (!) 42 78 67 (!) 54   Resp: 15  17 21    Temp:       TempSrc:       SpO2: 100% 98% 98% 94%   Weight:       Height:           Pulse Oximetry Analysis - Normal 99% on RA    EKG:  Interpreted by me:   Time Interpreted: 1429   Rate: 49   Rhythm: Second degree heart block, type 1 Mobitz   Interpretation: Second degree heart block type 1 Mobitz with bradycardia, left axis deviation, RBBB, no ST changes   Comparison:  New type 1 Mobitz    Procedures:   Procedures    Medical Decision Making  Presenting complaint: Fatigue  Clinical status: Afebrile, alert, awake, initially came to ED in second degree heart block, converted to sinus rhythm without intervention  Differential diagnosis includes but is not  limited to: Infection including viral infection, UTI, pneumonia, cellulitis, intra abdominal infection, hypothyroidism, electrolyte abnormality, anemia, hypoglycemia, dehydration, medication side effect, auto immune process, arrhythmia, COPD, interstitial lung disease, CHF  Management: Work up with creatinine elevated, baseline at 1.4 3 months ago, up to 2.5 now. Also has mild hypokalemia, with no hypokalemia related EKG changes noted. With AKI, new heart block, admitted to [facility] for further management.      Problems Addressed:  AKI (acute kidney injury): acute illness or injury  Second degree heart block: undiagnosed new problem with uncertain prognosis    Amount and/or Complexity of Data Reviewed  Labs: ordered.  Radiology: ordered.  ECG/medicine tests: ordered.    Risk  OTC drugs.      ED course:         Diagnosis     Clinical Impression:   1. Second degree heart block    2. AKI (acute kidney injury)        Treatment Plan:   ED Disposition       ED Disposition    Observation    Condition   --    Date/Time   Mon Aug 24, 2022  4:54 PM    Comment   Admitting Physician: Jeronimo Greaves [[id number]]   Service:: Medicine [[id number]]   Estimated Length of Stay: < 2 midnights   Tentative Discharge Plan?: Home or Self Care [1]   Does patient need telemetry?: Yes   Is patient 18 yrs or greater?: Yes   Telemetry type (separate Telemetry order is also required):: Adult telemetry                        Iva Lento, MD  09/10/22 1432

## 2022-08-24 NOTE — Progress Notes (Signed)
Have you seen any specialists/other providers since your last visit with [place]?    No    Arm preference verified?   Yes, no preference    Health Maintenance Due   Topic Date Due    Medicare Annual Wellness Visit  Never done    Shingrix Vaccine 50+ (1) Never done    COVID-19 Vaccine (4 - 2023-24 season) 10/17/2021    FALLS RISK ANNUAL  05/14/2022

## 2022-08-24 NOTE — H&P (Signed)
[facility]      Patient: Rick [name]  Date: 08/24/2022   DOB: 12/17/1924  Date of Admission: 08/24/2022   MRN: [id number]  Attending: Zada Finders, MD         No chief complaint on file.       History Gathered From: chart review    HISTORY AND PHYSICAL     Rick [name] is a 89 y.o. male with a PMHx of dementia, monoclonal gammopathy, HFrEF with EF 30%, CAD, hypertension and hyperlipidemia who presented to the ED as a transfer from [facility] for the evaluation of hypotension.  Please note patient is a poor historian, no family is at bedside thus information obtained is limited and mostly per chart review.  He was seen by his PCP today and was noted to be very hypotensive with a BP of 90/40 thus he was sent to the ED for further evaluation.    When he arrived to the ED vital signs were stable with improvement in BP of 130/61 and lab data obtained noted  unremarkable CBC, BUN 56, Creatine 2.5 and proBNP 548.  He is being admitted under the Hospitalist service for further management.    Past Medical History:   Diagnosis Date    Arrhythmia 2021    RBBB    Atrial enlargement, bilateral Echo result 04/04/19    Bilateral cataracts     Son reports pt has the ability to lay flat for 45+min without CP or SOB as reported on 05/14/22 for DOS  06/04/22    CAD (coronary artery disease) 05/28/19 Cath results.    Severe 3V CAD.  Patent LIMA to LAD, patent SVG to OM 1 and OM 2 of circumflex CA, & patent SVG to RCA.    Chronic combined systolic and diastolic heart failure 310/21 OV Dr. Penny Pia    EF=30%    Dementia 04/2014    Difficulty walking 06/2016    Post surgery of lt knee, rt femur/ uses walker and wheelchair for long distances prn    Disorder of prostate     Dyslipidemia 310/21 OV Dr. Penny Pia    Dyspnea on exertion 02/2018    occas per son report on 05/14/22, doing better    Hearing loss     mild . does not like to wear/ bilat    HTN (hypertension) 310/21 OV Dr. Penny Pia    Son reports unaware  of htn dx as reported on 3/114/24    Low back pain     muscular    Primary cardiomyopathy 11/2017    Urinary incontinence     occas  2-3 monthly at qhs or in am    VTE (venous thromboembolism) Feb 2023    In lungs, combined with bronchitis       Past Surgical History:   Procedure Laterality Date    COLONOSCOPY, DIAGNOSTIC (SCREENING)      CORONARY ARTERY BYPASS GRAFT  2003     approx year: LIMA to LAD,  SVG to OM 1 & OM 2 of circumflex CA, & SVG to RCA    FRACTURE SURGERY Right 06/2016    femur    HERNIORRHAPHY, INGUINAL Right 04/29/2021    Procedure: OPEN RIGHT HERNIORRHAPHY, INGUINAL WITH MESH;  Surgeon: Towanda Octave, MD;  Location: [facility];  Service: General;  Laterality: Right;    PERCUTANEOUS VALVULOPLASTY - AORTIC N/A 07/12/2019    Procedure: TAVR #2;  Surgeon: Arlyss Queen, MD PhD;  Location: [facility];  Service: Cardiovascular;  Laterality: N/A;  23 HR OB    REPLACEMENT, AORTIC VALVE , EDWARDS SAPIEN N/A 07/12/2019    Procedure: REPLACEMENT, AORTIC VALVE , EDWARDS SAPIEN -- TAVR Valve:   S3 29mm -- Access: RTF -- IC: Dr. Penny Pia;  Surgeon: Christene Lye, MD;  Location: [facility] HEART OR;  Service: Cardiothoracic;  Laterality: N/A;    RIGHT & LEFT HEART CATH POSSIBLE PCI N/A 05/26/2019    Procedure: RIGHT & LEFT HEART CATH POSSIBLE PCI;  Surgeon: Arlyss Queen, MD PhD;  Location: [facility];  Service: Cardiovascular;  Laterality: N/A;  [id number] scheduled with Maira in dr's office. OP. kr    TOTAL KNEE ARTHROPLASTY Left 2017    TOTAL KNEE ARTHROPLASTY Right 2005       Prior to Admission medications    Medication Sig Start Date End Date Taking? Authorizing Provider   acetaminophen (TYLENOL) 500 MG tablet Take 1 tablet (500 mg) by mouth as needed for Pain    [provider]   docusate sodium (COLACE) 100 MG capsule Take 1 capsule (100 mg) by mouth 2 (two) times daily    [provider]   donepezil (ARICEPT) 5 MG tablet Take 1 tablet (5 mg) by mouth daily Gives 2.5mg   daily 11/17/21 11/18/22  [provider]   empagliflozin (JARDIANCE) 10 MG tablet Take 1 tablet (10 mg) by mouth daily 03/30/22   Erasmo Score, MD   furosemide (LASIX) 20 MG tablet Take 1 tablet (20 mg) by mouth daily 05/29/22   Erasmo Score, MD   memantine (NAMENDA) 10 MG tablet Take 2 tablets (20 mg) by mouth every evening 05/29/22   Erasmo Score, MD   Multiple Vitamin (multivitamin) capsule Take 1 capsule by mouth daily    [provider]   rosuvastatin (CRESTOR) 20 MG tablet  07/15/22   [provider]   saccharomyces boulardii (FLORASTOR) 250 MG capsule Take 1 capsule (250 mg) by mouth daily    [provider]   sacubitril-valsartan (ENTRESTO) 24-26 MG Tab per tablet Take 0.5 tablets by mouth 2 (two) times daily 07/22/22   Erasmo Score, MD   spironolactone (ALDACTONE) 25 MG tablet Take 0.5 tablets (12.5 mg) by mouth daily 06/17/22   Fortino Sic, MD   tamsulosin (FLOMAX) 0.4 MG Cap Take 1 capsule (0.4 mg) by mouth every evening 06/29/22   Erasmo Score, MD   Vitamin D, Cholecalciferol, 50 MCG (2000 UT) Cap Take 2,000 Units by mouth Once every Monday, Wednesday and Friday morning    [provider]   apixaban (ELIQUIS) 2.5 MG Take 1 tablet (2.5 mg) by mouth every 12 (twelve) hours  Patient not taking: Reported on 08/24/2022  08/24/22  [provider]   guaiFENesin (ROBITUSSIN) 100 MG/5ML oral liquid Take 10 mLs (200 mg) by mouth as needed  Patient not taking: Reported on 05/14/2022  08/24/22  [provider]   rosuvastatin (CRESTOR) 10 MG tablet Take 1 tablet (10 mg) by mouth daily  Patient taking differently: Take 0.5 tablets (5 mg) by mouth daily 07/28/21 08/24/22  Gilford Raid, NP   UNABLE TO FIND daily Med Name: Prostate herb supplement  08/24/22  [provider]       No Known Allergies    Family History   Problem Relation Age of Onset    No known problems Mother     No known problems Father     No known problems Sister     No known problems  Brother         Social History     Tobacco Use    Smoking status: Never    Smokeless tobacco: Never   Vaping Use    Vaping status: Never Used   Substance Use Topics    Alcohol use: Never    Drug use: Never       REVIEW OF SYSTEMS   Review of systems has been obtained, all pertinent positive and negatives as per HPI.  All remaining systems negative    PHYSICAL EXAM     Vital Signs (most recent): BP 149/57   Pulse (!) 53   Temp 97.7 F (36.5 C) (Oral)   Resp 16   Ht 1.727 m (5\' 8" )   Wt 70 kg (154 lb 5.2 oz)   SpO2 98%   BMI 23.46 kg/m   Constitutional: Patient seen lying in bed alert, oriented x 1 and in no acute distress  HEENT: NC/AT, PERRL, no scleral icterus or conjunctival pallor, no nasal discharge, MMM, oropharynx without erythema or exudate  Neck: trachea midline, supple, no cervical or supraclavicular lymphadenopathy or masses  Cardiovascular: RRR, normal S1 S2, no murmurs, gallops, palpable thrills, no JVD, Non-displaced PMI.  Respiratory: Normal rate. No retractions or increased work of breathing. Clear to auscultation and percussion bilaterally.  Gastrointestinal: +BS, non-distended, soft, non-tender, no rebound or guarding, no hepatosplenomegaly  Genitourinary: no suprapubic or costovertebral angle tenderness  Musculoskeletal: ROM and motor strength grossly normal. No clubbing, edema, or cyanosis. DP and radial pulses 2+ and symmetric.  Neurologic: EOMI, CN 2-12 grossly intact. no gross motor or sensory deficits  LABS & IMAGING     Recent Results (from the past 24 hour(s))   ECG 12 lead    Collection Time: 08/24/22  1:01 PM   Result Value Ref Range    Ventricular Rate 52 BPM    Atrial Rate 52 BPM    P-R Interval 224 ms    QRS Duration 158 ms    Q-T Interval 472 ms    QTC Calculation (Bezet) 438 ms    P Axis 64 degrees    R Axis -43 degrees    T Axis -33 degrees    IHS MUSE NARRATIVE AND IMPRESSION       SINUS BRADYCARDIA WITH 1ST DEGREE A-V BLOCK FREQUENT PREMATURE VENTRICULAR COMPLEXES IN A PATTERN OF  BIGEMINY  LEFT AXIS DEVIATION  RIGHT BUNDLE BRANCH BLOCK  ABNORMAL ECG  WHEN COMPARED WITH ECG OF 18-Jun-2022 09:44,  QRS AXIS SHIFTED LEFT  T WAVE INVERSION MORE EVIDENT IN INFERIOR LEADS  QT HAS SHORTENED  Confirmed by Erasmo Score ([id number]) on 08/24/2022 2:02:04 PM     Whole Blood Glucose POCT    Collection Time: 08/24/22  2:40 PM   Result Value Ref Range    Whole Blood Glucose POCT 84 70 - 100 mg/dL   Comprehensive Metabolic Panel    Collection Time: 08/24/22  2:47 PM   Result Value Ref Range    Glucose 79 70 - 100 mg/dL    BUN 56 (H) 9 - 28 mg/dL    Creatinine 2.5 (H) 0.5 - 1.5 mg/dL    Sodium 161 096 - 045 mEq/L    Potassium 5.4 (H) 3.5 - 5.3 mEq/L    Chloride 109 99 - 111 mEq/L    CO2 26 17 - 29 mEq/L    Calcium 10.2 7.9 - 10.2 mg/dL    Anion Gap 5.0 5.0 - 15.0    GFR 23.1 (L) >=  60.0 mL/min/1.73 m2    AST (SGOT) 18 5 - 41 U/L    ALT 11 0 - 55 U/L    Alkaline Phosphatase 68 37 - 117 U/L    Albumin 3.9 3.5 - 5.0 g/dL    Protein, Total 8.3 6.0 - 8.3 g/dL    Globulin 4.4 (H) 2.0 - 3.6 g/dL    Albumin/Globulin Ratio 0.9 0.9 - 2.2    Bilirubin, Total 0.6 0.2 - 1.2 mg/dL   NT-ProBNP    Collection Time: 08/24/22  2:47 PM   Result Value Ref Range    NT-ProBNP 548 (H) <450 pg/mL   CBC with Differential    Collection Time: 08/24/22  2:47 PM   Result Value Ref Range    WBC 3.77 3.10 - 9.50 x10 3/uL    Hemoglobin 12.5 12.5 - 17.1 g/dL    Hematocrit 09.8 11.9 - 49.6 %    Platelet Count 78 (L) 142 - 346 x10 3/uL    MPV 11.0 8.9 - 12.5 fL    RBC 3.60 (L) 4.20 - 5.90 x10 6/uL    MCV 106.1 (H) 78.0 - 96.0 fL    MCH 34.7 (H) 25.1 - 33.5 pg    MCHC 32.7 31.5 - 35.8 g/dL    RDW 13 11 - 15 %    nRBC % 0.0 <=0.0 /100 WBC    Absolute nRBC 0.00 <=0.00 x10 3/uL    Preliminary Absolute Neutrophil Count 1.66 1.10 - 6.33 x10 3/uL    Neutrophils % 44.0 Not Established %    Lymphocytes % 41.1 Not Established %    Monocytes % 12.5 Not Established %    Eosinophils % 2.1 Not Established %    Basophils % 0.3 Not Established %    Immature Granulocytes  % 0.0 Not Established %    Absolute Neutrophils 1.66 1.10 - 6.33 x10 3/uL    Absolute Lymphocytes 1.55 0.42 - 3.22 x10 3/uL    Absolute Monocytes 0.47 0.21 - 0.85 x10 3/uL    Absolute Eosinophils 0.08 0.00 - 0.44 x10 3/uL    Absolute Basophils 0.01 0.00 - 0.08 x10 3/uL    Absolute Immature Granulocytes 0.00 0.00 - 0.07 x10 3/uL   COVID-19 and Influenza (Liat) (symptomatic)    Collection Time: 08/24/22  2:48 PM    Specimen: Anterior Nares; Swab   Result Value Ref Range    SARS-CoV-2 (COVID-19) RNA Not Detected Not Detected    Influenza A RNA Not Detected Not Detected    Influenza B RNA Not Detected Not Detected   Urinalysis with Reflex to Microscopic Exam and Culture    Collection Time: 08/24/22  4:05 PM   Result Value Ref Range    Urine Color Straw Colorless, Straw or Yellow    Urine Clarity Clear Clear, Hazy    Urine Specific Gravity 1.008 1.001 - 1.035    Urine pH 6.5 5.0 - 8.0    Urine Leukocyte Esterase Negative Negative    Urine Nitrite Negative Negative    Urine Protein Negative Negative    Urine Glucose 200= 2+ (A) Negative    Urine Ketones Negative Negative mg/dL    Urine Urobilinogen Normal 0.2 - 2.0 mg/dL    Urine Bilirubin Negative Negative    Urine Blood Negative Negative   Urine Wallace Cullens Culture Hold Tube    Collection Time: 08/24/22  4:05 PM   Result Value Ref Range    Extra Tube Hold for add-ons.  EMERGENCY DEPARTMENT COURSE:  Orders Placed This Encounter   Procedures    Basic Metabolic Panel    CBC with Differential    Adult diet Regular; Fluid restriction: 1800 ML FLUID    Telemetry Box Communication    Vital signs    Pulse Oximetry    Progressive Mobility Protocol    Notify physician    I/O    Height    Weight    Skin assessment    Notify physician (Critical Blood Glucose Value)    Notify physician (Communication: Document Abnormal Blood Glucose)    POCT order (PRN hypoglycemia)    Adult Hypoglycemia Treatment Algorithm    Place sequential compression device    Maintain sequential compression  device    Education: Activity    Education: Disease Process & Condition    Education: Pain Management    Education: Falls Risk    Education: Smoking Cessation    Full Code    Saline lock IV    ADMIT TO INPATIENT       ASSESSMENT & PLAN     Rick [name] is a 89 y.o. male admitted with Acute kidney failure.    Patient Active Hospital Problem List:    Hypotensive  -resolved.  He was hypotensive in his PCP's office however suspect this is from polypharmacy.  Currently he is normotensive.  Will continue to monitor, restart outpatient meds as needed.    2.   Dementia  3.   Monoclonia gammopathy  -aware    4.   Hypertension  5.   Hyperlipidemia  -continue outpatient medications    6.   HFrEF  -patient was hypotensive on arrival, will continue outpatient Entresto, monitor for worsening renal symptoms and repeat labs in am.          Recent Labs     08/24/22  1447   Platelet Count 78*     Diagnosis: Mild Thrombocytopenia              Recent Labs     08/24/22  1447   Potassium 5.4*     Diagnosis: Hyperkalemia         DVT/VTE Prophylaxis:   Current Facility-Administered Medications (Includes Only Anticoagulants, Misc. Hematological)   Medication Dose Route Last Admin    heparin (porcine) injection 5,000 Units  5,000 Units Subcutaneous         Code Status: Full Code    Patient Class: OBSERVATION.    Anticipated medical stability for discharge: Less than 24 Hours      Signed,  Zada Finders, MD    08/24/2022 10:59 PM  Time Elapsed: 35 min

## 2022-08-24 NOTE — ED Notes (Signed)
Son at bedside and pt tol po well. Updated family for admission room number and eta for transport

## 2022-08-24 NOTE — ED to IP RN Note (Signed)
[facility]  ED NURSING NOTE FOR THE RECEIVING INPATIENT NURSE   ED Calvert RN   [facility] [id number]   ED CHARGE RN Laverda Page RN   ADMISSION INFORMATION   Rick [name] is a 89 y.o. male admitted with an ED diagnosis of:    1. AKI (acute kidney injury)         Isolation: None   Allergies: Patient has no known allergies.   Holding Orders confirmed? N/A   Belongings Documented? Yes   Home medications sent to pharmacy confirmed? N/A   NURSING CARE   Patient Comes From:   Mental Status: Home/Family Care  confused   ADL: Dependent with ADLs   Ambulation: 2 person assist   Pertinent Information  and Safety Concerns:     Broset Violence Risk Level: Moderate Pt seen pmd for medical clearance for cataract surgery. At the office pt was hypotensive, mild cough and fatigue with hx of dementia. Pt has arrhythmias and bradycardia and goes into mobitz 1 to sinus arrthythmia, pt is confuse and fatigued       CT / NIH   CT Head ordered on this patient?  No   NIH/Dysphagia assessment done prior to admission? Yes   VITAL SIGNS (at the time of this note)      Vitals:    08/24/22 1628   BP: 145/65   Pulse: (!) 51   Resp: 15   Temp:    SpO2: 99%     Pain Score: 0-No pain (08/24/22 1427)  Score: FLACC (rest): 0 (08/24/22 1429)

## 2022-08-24 NOTE — ED Notes (Signed)
Pt's son expressing concerns about transport delays. Reports intent to take father POV if delay is extended. MD aware

## 2022-08-24 NOTE — ED Notes (Signed)
Urine collection bag placed.

## 2022-08-24 NOTE — ED Notes (Signed)
Call placed to [facility] to inform about pt going POV to [facility]

## 2022-08-24 NOTE — ED Notes (Signed)
Pt's son requesting necessary paperwork to transfer father via POV. Risks explained by MD, pt's son/guardian verbalizes understanding and willingness to sign necessary forms.

## 2022-08-24 NOTE — ED Notes (Signed)
Pericare given and placed new adult diaper, reposition for comfort

## 2022-08-25 LAB — BASIC METABOLIC PANEL
Anion Gap: 4 — ABNORMAL LOW (ref 5.0–15.0)
BUN: 45 mg/dL — ABNORMAL HIGH (ref 9–28)
CO2: 25 mEq/L (ref 17–29)
Calcium: 9.3 mg/dL (ref 7.9–10.2)
Chloride: 114 mEq/L — ABNORMAL HIGH (ref 99–111)
Creatinine: 1.9 mg/dL — ABNORMAL HIGH (ref 0.5–1.5)
GFR: 32.1 mL/min/{1.73_m2} — ABNORMAL LOW (ref >=60.0)
Glucose: 113 mg/dL — ABNORMAL HIGH (ref 70–100)
Potassium: 4.3 mEq/L (ref 3.5–5.3)
Sodium: 143 mEq/L (ref 135–145)

## 2022-08-25 LAB — LAB USE ONLY - CBC WITH DIFFERENTIAL
Absolute Basophils: 0.01 10*3/uL (ref 0.00–0.08)
Absolute Eosinophils: 0.14 10*3/uL (ref 0.00–0.44)
Absolute Immature Granulocytes: 0.01 10*3/uL (ref 0.00–0.07)
Absolute Lymphocytes: 1.26 10*3/uL (ref 0.42–3.22)
Absolute Monocytes: 0.4 10*3/uL (ref 0.21–0.85)
Absolute Neutrophils: 1.69 10*3/uL (ref 1.10–6.33)
Absolute nRBC: 0 10*3/uL (ref <=0.00)
Basophils %: 0.3 %
Eosinophils %: 4 %
Hematocrit: 34.9 % — ABNORMAL LOW (ref 37.6–49.6)
Hemoglobin: 11.3 g/dL — ABNORMAL LOW (ref 12.5–17.1)
Immature Granulocytes %: 0.3 %
Lymphocytes %: 35.9 %
MCH: 34.1 pg — ABNORMAL HIGH (ref 25.1–33.5)
MCHC: 32.4 g/dL (ref 31.5–35.8)
MCV: 105.4 fL — ABNORMAL HIGH (ref 78.0–96.0)
MPV: 11.2 fL (ref 8.9–12.5)
Monocytes %: 11.4 %
Neutrophils %: 48.1 %
Platelet Count: 72 10*3/uL — ABNORMAL LOW (ref 142–346)
Preliminary Absolute Neutrophil Count: 1.69 10*3/uL (ref 1.10–6.33)
RBC: 3.31 10*6/uL — ABNORMAL LOW (ref 4.20–5.90)
RDW: 13 % (ref 11–15)
WBC: 3.51 10*3/uL (ref 3.10–9.50)
nRBC %: 0 /100 WBC (ref <=0.0)

## 2022-08-25 NOTE — Progress Notes (Signed)
Initial Case Management Assessment and Discharge Planning [facility]   Patient Name: Rick [name], Rick [name]   Date of Birth [birth year]   Attending Physician: Gayleen Orem, MD   Primary Care Physician: Erasmo Score, MD   Length of Stay 1   Reason for Consult / Chief Complaint Initial assessment        Situation   Admission DX:   1. Acute kidney failure        A/O Status: Partially    LACE Score: 9    Patient admitted from: ER  Admission Status: inpatient    Health Care Agent: Child  Name: Rendon Atondo II  Phone number: [phone]       Background     Advanced directive:   Not Received    has an advance directive     Code Status:   Full Code     Residence: Multi-story home    PCP: Erasmo Score, MD  Patient Contact:   [phone] (home)     [phone] (mobile)     Emergency contact:   Extended Emergency Contact Information  Primary Emergency Contact: Minette Headland II,Prabhav  Address: [address]           [place], [place] [address] [place]  Home Phone: [phone]  Mobile Phone: [phone]  Relation: Son  Secondary Emergency Contact: Avelino Leeds  Address: [address]           [place], [place] [address] [place]  Home Phone: [phone]  Relation: Daughter in law      ADL/IADL's: Dependent  Previous Level of function: 5 Supervision     DME: None    Pharmacy:     [facility] [address] [facility] - [place], [place] - [address]  [address]  [place] [address]  Phone: [phone] Fax: [phone]    [facility] [address] [place] - [address]  [address] [facility] [address]  [place] [address]  Phone: [phone] Fax: [phone]      Prescription Coverage: Yes    Home Health: The patient is not currently receiving home health services.    Previous SNF/AR: SNF    UAI on file?: No  Transport for discharge? Mode of transportation: Sales executive - Family/Friend to drive patient  Agreeable to Home with family post-discharge:   Yes     Assessment   CM completed IDPA via chart review. Pt resides with son and daughter in law. Previous SNF and HH services. Son to transport pt upon d/c.   BARRIERS TO DISCHARGE: medical condition     Recommendation   D/C Plan A: Home with family    D/C Plan B: Home with home health           Sherryll Burger BSN, RN CM  RN Case Manager I  [facility]  [phone]

## 2022-08-25 NOTE — Progress Notes (Signed)
Called report down the ED on 7/8 to rule out CHF/pneumonia. Patient was hypotensive with a BP at 90/40 along with fatigue, altered mental status and a cough.     Escorted patient in wheelchair and son down to the E.D. Son had had started to check in patient and stated they did not need any further assistance from RN.

## 2022-08-25 NOTE — UM Notes (Signed)
Order [id number]   08/24/22 2259  ADMIT TO INPATIENT  Once           Initial Review  Class: Inpatient  Level of Care: Level 4 (Acute)      Patient Name: Rick [name], Rick [name]   Date of Birth August 11, 1924   MRN: [id number]          Summary:  Rick [name] is a 89 y.o. male who presented to the hospital "as a transfer from [facility] for the evaluation of hypotension."      "He was seen by his PCP today and was noted to be very hypotensive with a BP of 90/40 thus he was sent to the ED for further evaluation."        Medical History:  Past Medical History:   Diagnosis Date    Arrhythmia 2021    RBBB    Atrial enlargement, bilateral Echo result 04/04/19    Bilateral cataracts     Son reports pt has the ability to lay flat for 45+min without CP or SOB as reported on 05/14/22 for DOS  06/04/22    CAD (coronary artery disease) 05/28/19 Cath results.    Severe 3V CAD.  Patent LIMA to LAD, patent SVG to OM 1 and OM 2 of circumflex CA, & patent SVG to RCA.    Chronic combined systolic and diastolic heart failure 310/21 OV Dr. Penny Pia    EF=30%    Dementia 04/2014    Difficulty walking 06/2016    Post surgery of lt knee, rt femur/ uses walker and wheelchair for long distances prn    Disorder of prostate     Dyslipidemia 310/21 OV Dr. Penny Pia    Dyspnea on exertion 02/2018    occas per son report on 05/14/22, doing better    Hearing loss     mild . does not like to wear/ bilat    HTN (hypertension) 310/21 OV Dr. Penny Pia    Son reports unaware of htn dx as reported on 3/114/24    Low back pain     muscular    Primary cardiomyopathy 11/2017    Urinary incontinence     occas  2-3 monthly at qhs or in am    VTE (venous thromboembolism) Feb 2023    In lungs, combined with bronchitis           Active Problems:  Patient Active Problem List   Diagnosis    Memory deficits    Coronary artery disease with history of myocardial infarction without history of CABG    Vertebrogenic low back pain    Nonrheumatic aortic valve stenosis     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    Dry cough    Aortic valve stenosis, etiology of cardiac valve disease unspecified    S/P TAVR (transcatheter aortic valve replacement)    Difficulty in walking, not elsewhere classified    Dementia without behavioral disturbance    Balance disorder    Chronic systolic CHF (congestive heart failure)    Acute pulmonary embolism with acute cor pulmonale, unspecified pulmonary embolism type    Pulmonary embolism with acute cor pulmonale, unspecified chronicity, unspecified pulmonary embolism type    Incarcerated inguinal hernia    Acute renal failure with tubular necrosis    Vascular dementia    Thrombocytopenia    Smoldering myeloma    Artificial knee joint present    Benign prostatic hyperplasia    Fracture of femur  Hallux valgus (acquired), right foot    History of total right knee replacement    Hypertension    IgG monoclonal gammopathy of uncertain significance    Loose total knee arthroplasty    Nocturia    Old myocardial infarction    Osteoarthritis of left knee    Other hammer toe(s) (acquired), left foot    Other hammer toe(s) (acquired), right foot    Other spondylosis with myelopathy, cervical region    URI with cough and congestion    Chronic bilateral low back pain without sciatica    Combined forms of age-related cataract of right eye    Combined forms of age-related cataract of left eye    Stage 3b chronic kidney disease    AKI (acute kidney injury)    Acute kidney failure           Care Day - 08/24/2022    Per H&P Note:  Hypotensive  -resolved.  He was hypotensive in his PCP's office however suspect this is from polypharmacy.  Currently he is normotensive.  Will continue to monitor, restart outpatient meds as needed.     2.   Dementia  3.   Monoclonia gammopathy  -aware     4.   Hypertension  5.   Hyperlipidemia  -continue outpatient medications     6.   HFrEF  -patient was hypotensive on arrival, will continue  outpatient Entresto, monitor for worsening renal symptoms and repeat labs in am.          Vitals:  Patient Vitals for the past 24 hrs:   BP Temp Temp src Pulse Resp SpO2 Height Weight   08/25/22 0752 129/64 97.5 F (36.4 C) Oral 66 16 98 % -- --   08/25/22 0412 132/50 -- -- (!) 51 12 99 % -- 70 kg (154 lb 5.2 oz)   08/25/22 0341 -- 97.3 F (36.3 C) Oral (!) 34 17 98 % -- --   08/25/22 0053 125/55 97.7 F (36.5 C) Oral (!) 54 12 98 % -- --   08/24/22 2207 149/57 97.7 F (36.5 C) Oral (!) 53 16 98 % 1.727 m (5\' 8" ) 70 kg (154 lb 5.2 oz)       SpO2: 98 % (08/25/2022  7:52 AM)  O2 Device: None (Room air) (08/25/2022  7:52 AM)      Weight:   Recent Weights for the past 720 hrs (Last 3 readings):   Weight   08/25/22 0412 70 kg (154 lb 5.2 oz)   08/24/22 2207 70 kg (154 lb 5.2 oz)         Labs:   Latest Reference Range & Units 08/24/22 14:47 08/24/22 16:05   Platelet Count 142 - 346 x10 3/uL 78 (L)    BUN 9 - 28 mg/dL 56 (H)    Creatinine 0.5 - 1.5 mg/dL 2.5 (H)    Potassium 3.5 - 5.3 mEq/L 5.4 (H)    EGFR >=60.0 mL/min/1.73 m2 23.1 (L)    Globulin 2.0 - 3.6 g/dL 4.4 (H)    NT-proBNP <161 pg/mL 548 (H)    Glucose, UA Negative   200= 2+ !         Medications:  heparin (porcine) injection 5,000 Units  Dose: 5,000 Units  Freq: Every 12 hours scheduled Route: SC  Start: 08/24/22 2330    23582                 tamsulosin (FLOMAX) capsule 0.4 mg  Dose: 0.4 mg  Freq: Every evening Route: PO  Indications of Use: BENIGN PROSTATIC HYPERTROPHY  Start: 08/24/22 2330    23585                     Continuous IV Infusions:  0.9% NaCl infusion  Rate: 75 mL/hr Freq: Continuous Route: IV  Start: 08/24/22 2330    23591                           ______________________________________________________________________      Care Day - 08/25/2022      Per Cardiology Note:  ASSESSMENT & PLAN:      89 y.o. male with prior medical history for HFrEF (LVEF 30%), coronary artery disease, TAVR, hypertension, hyperlipidemia, monoclonal gammopathy and dementia  who was sent to ED on 08/24/22 with hypotension (90/50).  He was hyperkalemic with AKI on presentation.  His heart rates was down to 30s overnight with 3-second pause for which a cardiac consultation was requested.      Primary diagnosis: Acute kidney injury     Bradycardia and intermittent Mobitz II  -Bradycardia down to 29  -multiple pauses ~2.0, longest pause of 2.9 seconds  -intermittent Mobitz II  -will request EP consultation      Acute kidney injury  -likely in setting of dehydration?  -BUN 56 with Cr 2.5 on arrival now BUN 45 Cr 1.9  -NT-proBNP  548     HFrEF  -LVEF 30%   -euvolemic fluid   -Daily weight 147-155 lbs.   -CXR without any acute process     Cardiomyopathy  -suspect mixed ischemic and non-ischemic  -[facility] Therapy              Beta Blocker: no due to bradycardia               ACEi/ARB/ARNI: 1/2 tablet Entresto bid              ZOX:WRUEAVWUJ 12.5 mg daily (at home, currently on hold due to hyperkalemia and AKI on presentation at hospital)              SGLT2i: Jardiance 10 mg               Diuretic:     Thrombocytopenia  -Platelets 72     Coronary artery disease  -rosuvastatin 5 mg daily         Vitals:  Patient Vitals for the past 24 hrs:   BP Temp Temp src Pulse Resp SpO2 Height Weight   08/25/22 1133 135/61 -- -- 60 16 98 % -- --   08/25/22 0800 -- -- -- -- -- 98 % -- --   08/25/22 0752 129/64 97.5 F (36.4 C) Oral 66 16 98 % -- --   08/25/22 0412 132/50 -- -- (!) 51 12 99 % -- 70 kg (154 lb 5.2 oz)   08/25/22 0341 -- 97.3 F (36.3 C) Oral (!) 34 17 98 % -- --   08/25/22 0053 125/55 97.7 F (36.5 C) Oral (!) 54 12 98 % -- --   08/24/22 2207 149/57 97.7 F (36.5 C) Oral (!) 53 16 98 % 1.727 m (5\' 8" ) 70 kg (154 lb 5.2 oz)     SpO2: 98 % (08/25/2022  7:52 AM)  O2 Device: None (Room air) (08/25/2022  7:52 AM)      Weight:   Recent Weights for the past 720 hrs (Last 3 readings):   Weight  08/25/22 0412 70 kg (154 lb 5.2 oz)   08/24/22 2207 70 kg (154 lb 5.2 oz)         Labs:    Latest Reference Range & Units 08/25/22 03:45   Hemoglobin 12.5 - 17.1 g/dL 16.1 (L)   Hematocrit 09.6 - 49.6 % 34.9 (L)   Platelet Count 142 - 346 x10 3/uL 72 (L)   Glucose 70 - 100 mg/dL 045 (H)   BUN 9 - 28 mg/dL 45 (H)   Creatinine 0.5 - 1.5 mg/dL 1.9 (H)   Chloride 99 - 111 mEq/L 114 (H)   Anion Gap 5.0 - 15.0  4.0 (L)   EGFR >=60.0 mL/min/1.73 m2 32.1 (L)           Medications:  Current Facility-Administered Medications   Medication Dose Route Frequency    docusate sodium  100 mg Oral BID    empagliflozin  10 mg Oral Daily    heparin (porcine)  5,000 Units Subcutaneous Q12H SCH    memantine  5 mg Oral BID    rosuvastatin  5 mg Oral Daily    sacubitril-valsartan  0.5 tablet Oral BID    tamsulosin  0.4 mg Oral QPM         Continuous IV Infusions:   sodium chloride 75 mL/hr at 08/25/22 1239             Doree Albee RN, BSN  UR Case Manager  [facility]  [email or web address]  Phone: [phone]  Fax: [phone]  2:37 PM  08/25/2022                              UTILIZATION REVIEW CONTACT: Name:  Doree Albee, RN BSN  Utilization Review  [facility]  Address:  [address] [place], [place] [address]  NPI:   [id number]  Tax ID:  [id number]  Phone: [phone], Option 2  Fax:  [phone]    Please use fax number [phone] to provide authorization for hospital services or to request additional information.        NOTES TO REVIEWER:    This clinical review is based on/compiled from documentation provided by the treatment team within the patient's medical record.

## 2022-08-25 NOTE — Progress Notes (Addendum)
[facility]  PROGRESS NOTE      Patient: Rick [name]  Date: 08/25/2022   LOS: 1 Days  Admission Date: 08/24/2022   MRN: [id number]  Attending: Gayleen Orem, MD  When on service as the attending, please contact me on Epic Secure Chat from 7 AM - 7 PM for non-urgent issues. For urgent matters use XTend page from 7 AM - 7 PM.       ASSESSMENT/PLAN     Rick [name] is a 89 y.o. male with history of dementia, monoclonal gammopathy, heart failure with reduced EF 30%, CAD, hypertension, hyperlipidemia admitted with Acute kidney failure and hypotension found to have bradycardia and 3-second pause    Interval Summary:     Patient Active Hospital Problem List:  Hypotension  By the time patient arrived blood pressure was normal  Entresto resumed with holding parameter  Spironolactone and Lasix on hold    Bradycardia  3-second pause  Intermittent Mobitz type II AV block  Cardiology evaluated and recommended EP consultation  EP evaluated and recommended for now no intervention      Acute kidney injury  Likely in setting of dehydration  Creatinine back to baseline 1.9    Chronic heart failure with reduced EF  Makes it ischemic and nonischemic cardiomyopathy  EF is 30%  Chest x-ray unremarkable, proBNP  548  Currently euvolemic  Continue Entresto and Jardiance  Aldactone and Lasix on hold  Beta-blocker on hold because of bradycardia    Chronic thrombocytopenia  Continue to monitor platelets    Coronary artery disease  Continue statin    History of PE  Patient is not on anticoagulation    Nutrition: Heart healthy diet                 Recent Labs     08/25/22  0345 08/24/22  1447   Platelet Count 72* 78*     Diagnosis: Moderate Thrombocytopenia              Recent Labs     08/25/22  0345 08/24/22  1447   Potassium 4.3 5.4*     Diagnosis: Hyperkalemia        Recent Labs   Lab 08/25/22  0345 08/24/22  1447   Hemoglobin 11.3* 12.5   Hematocrit 34.9* 38.2   MCV 105.4* 106.1*   WBC 3.51 3.77   Platelet Count  72* 78*         Anemia Diagnosis: Chronic: Anemia of Chronic Kidney Disease    Code Status: Full Code    Dispo: 24 to 48 hours    Family Contact: none    DVT Prophylaxis:   Current Facility-Administered Medications (Includes Only Anticoagulants, Misc. Hematological)   Medication Dose Route Last Admin    heparin (porcine) injection 5,000 Units  5,000 Units Subcutaneous 5,000 Units at 08/25/22 0911          CHART  REVIEW & DISCUSSION     The following chart items were reviewed as of 6:50 PM on 08/25/22:  [x]  Lab Results [x]  Imaging Results   [x]  Problem List  [x]  Current Orders [x]  Current Medications  [x]  Allergies  []  Code Status []  Previous Notes   []  SDoH    The management and plan of care for this patient was discussed with the following specialty consultants:  [x]  Cardiology  []  Gastroenterology                 []  Infectious Disease  []   Pulmonology []  Neurology                []  Nephrology  []  Neurosurgery []  Orthopedic Surgery  []  Heme/Onc  []  General Surgery []  Psychiatry                                   []  Palliative    SUBJECTIVE     Rick [name] evaluated at the bedside alert oriented x 2  Denies any chest pain, lightheadedness, nausea or vomiting  RN reported episode of bradycardia with 3-second pause    MEDICATIONS     Current Facility-Administered Medications   Medication Dose Route Frequency    docusate sodium  100 mg Oral BID    empagliflozin  10 mg Oral Daily    heparin (porcine)  5,000 Units Subcutaneous Q12H SCH    memantine  5 mg Oral BID    rosuvastatin  5 mg Oral Daily    sacubitril-valsartan  0.5 tablet Oral BID    tamsulosin  0.4 mg Oral QPM       PHYSICAL EXAM     Vitals:    08/25/22 1749   BP: 171/77   Pulse: 60   Resp:    Temp:    SpO2: 100%       Temperature: Temp  Min: 97.3 F (36.3 C)  Max: 97.7 F (36.5 C)  Pulse: Pulse  Min: 34  Max: 78  Respiratory: Resp  Min: 12  Max: 21  Non-Invasive BP: BP  Min: 125/55  Max: 197/79  Pulse Oximetry SpO2  Min: 94 %  Max: 100 %    Intake  and Output Summary (Last 24 hours) at Date Time    Intake/Output Summary (Last 24 hours) at 08/25/2022 1850  Last data filed at 08/25/2022 1727  Gross per 24 hour   Intake 906 ml   Output 2700 ml   Net -1794 ml         GEN APPEARANCE: Normal;  A&OX2  HEENT: PERLA; EOMI; Conjunctiva Clear  NECK: Supple; No bruits  CVS: RRR, S1, S2; No M/G/R  LUNGS: CTAB; No Wheezes; No Rhonchi: No rales  ABD: Soft; No TTP; + Normoactive BS  EXT: No edema; Pulses 2+ and intact  NEURO: CN 2-12 intact; No Focal neurological deficits  MENTAL STATUS: Communicative and pleasant    LABS     Recent Labs   Lab 08/25/22  0345 08/24/22  1447   WBC 3.51 3.77   RBC 3.31* 3.60*   Hemoglobin 11.3* 12.5   Hematocrit 34.9* 38.2   MCV 105.4* 106.1*   Platelet Count 72* 78*       Recent Labs   Lab 08/25/22  0345 08/24/22  1447   Sodium 143 140   Potassium 4.3 5.4*   Chloride 114* 109   CO2 25 26   BUN 45* 56*   Creatinine 1.9* 2.5*   Glucose 113* 79   Calcium 9.3 10.2       Recent Labs   Lab 08/24/22  1447   ALT 11   AST (SGOT) 18   Bilirubin, Total 0.6   Albumin 3.9   Alkaline Phosphatase 68                   Microbiology Results (last 15 days)       Procedure Component Value Units Date/Time    COVID-19 and Influenza ([place]) (symptomatic) [[id number]]  (  Normal) Collected: 08/24/22 1448    Order Status: Completed Specimen: Swab from Anterior Nares Updated: 08/24/22 1519     SARS-CoV-2 (COVID-19) RNA Not Detected     Influenza A RNA Not Detected     Influenza B RNA Not Detected    Narrative:      A result of "Detected" indicates POSITIVE for the presence of viral RNA  A result of "Not Detected" indicates NEGATIVE for the presence of viral RNA    Test performed using the Roche cobas Liat SARS-CoV-2 & Influenza A/B assay. This is a multiplex real-time RT-PCR assay for the detection of SARS-CoV-2, influenza A, and influenza B virus RNA. Viral nucleic acids may persist in vivo, independent of viability. Detection of viral nucleic acid does not imply the presence  of infectious virus, or that virus nucleic acid is the cause of clinical symptoms. Negative results do not preclude SARS-CoV-2, influenza A, and/or influenza B infection and should not be used as the sole basis for diagnosis, treatment or other patient management decisions. Invalid results may be due to inhibiting substances in the specimen and recollection should occur.              RADIOLOGY     Chest AP Portable    Result Date: 08/24/2022   No active disease. Postsurgical changes. Cardiomegaly. Kinnie Feil, MD 08/24/2022 3:19 PM   Echo Results       None          Results for orders placed or performed during the hospital encounter of 06/18/22   CT Head WO Contrast    Narrative    HISTORY: Unwitnessed fall. Posttraumatic headache and weakness. Patient is  on anticoagulation.    COMPARISON: Outside Brain MRI on 09/03/2020.    TECHNIQUE: CT of the head performed without intravenous contrast. The  following dose reduction techniques were utilized: automated exposure  control and/or adjustment of the mA and/or KV according to patient size,  and the use of an iterative reconstruction technique.    CONTRAST: None.    FINDINGS:  There is no acute hemorrhage. The ventricles and subarachnoid spaces are  proportionately enlarged reflecting volume loss.  The gray-white matter  differentiation is preserved. There is a small chronic lacunar infarct in  right cerebellum. There is moderate periventricular hypodensity most  consistent with chronic small vessel ischemic change. There is no mass  effect or midline shift. The basal cisterns are patent. Intracranial  vascular calcifications are present.    No suspicious or aggressive osseous lesion is identified. The imaged  paranasal sinuses and mastoids are clear.         Impression       1.No CT evidence of acute intracranial hemorrhage, herniation, or  hydrocephalus.   2.Moderate chronic small vessel ischemic changes.      Nelya Ebadirad  06/18/2022 9:37 AM       Signed,  Gayleen Orem, MD  6:50 PM 08/25/2022

## 2022-08-25 NOTE — Consults (Addendum)
CARDIOLOGY HOSPITAL NOTE  Gilford Raid, NP   [facility] Cardiology  Office phone:  [phone]     [facility] phone [phone]  [facility] phone [phone]    Date:  08/25/2022  11:56 AM    Patient:  Rick [name].  DOB: 1925-02-04.  89 y.o.  male , MRN [id number]   Attending:  Gayleen Orem, MD   Admission date:  08/24/2022     ASSESSMENT & PLAN:     89 y.o. male with prior medical history for HFrEF (LVEF 30%), coronary artery disease, TAVR, hypertension, hyperlipidemia, monoclonal gammopathy and dementia who was sent to ED on 08/24/22 with hypotension (90/50).  He was hyperkalemic with AKI on presentation.  His heart rates was down to 30s overnight with 3-second pause for which a cardiac consultation was requested.     Primary diagnosis: Acute kidney injury    Bradycardia and intermittent Mobitz I  -Bradycardia down to 29  -multiple pauses ~2.0, longest pause of 2.9 seconds  -intermittent Mobitz II  -will request EP consultation     Acute kidney injury  -likely in setting of dehydration?  -BUN 56 with Cr 2.5 on arrival now BUN 45 Cr 1.9  -NT-proBNP  548    HFrEF  -LVEF 30%   -euvolemic fluid   -Daily weight 147-155 lbs.   -CXR without any acute process    Cardiomyopathy  -suspect mixed ischemic and non-ischemic  -Guideline Directed Medical Therapy   Beta Blocker: no due to bradycardia    ACEi/ARB/ARNI: 1/2 tablet Entresto bid   GGY:IRSWNIOEV 12.5 mg daily (at home, currently on hold due to hyperkalemia and AKI on presentation at hospital)   SGLT2i: Jardiance 10 mg    Diuretic:    Thrombocytopenia  -Platelets 72    Coronary artery disease  -rosuvastatin 5 mg daily     _____________________________    Physician Statement:    I personally evaluated Rick [name] and performed a substantive portion of this visit by personally conducting the medical-decision making in its entirety.  The patient was also seen by APP who performed the history and physical exam  portion of the visit. I reviewed and updated the documented findings and plan of care accordingly.     89 year old gentleman with history of CAD s/p CABG, s/p TAVR, HFrEF with LVEF of 25%, HLD, moderate pulm HTN, memory impairment admitted for evaluation of hypotension and noted to have AKI and hyperkalemia with a potassium of 5.4, and bradycardic.  Not on AV nodal blocking agents.  Telemetry shows episodes of marked sinus bradycardia with HR at times below 40, Mobitz type I, second-degree AVB (Wenckebach) as well as intermittent brief 2:1 AVB episodes as well as ~3-sec pauses overnight.  He was previously seen by EP in the office for ICD placement, at which time he wanted to think about it and did not follow-up afterwards.  Will request inpatient EP consultation.    _____________________________    [name], MD, MPH, [facility]  Cardiology - [facility]    Office: [phone]  [facility]: [phone]    [facility]: [phone]  [facility]: [phone]  _____________________________  08/25/2022     HISTORY OF PRESENT ILLNESS:     89 y.o. male with prior medical history for HFrEF (LVEF 30%), coronary artery disease, TAVR, hypertension, hyperlipidemia, monoclonal gammopathy and dementia who was sent to ED on 08/24/22 with hypotension (90/50).  He was hyperkalemic with AKI on presentation.  His heart rates was down to 30s overnight with 3-second pause for which a cardiac consultation was requested.     REVIEW OF SYSTEMS:   None.   PAST MEDICAL HISTORY:     Past Medical History:   Diagnosis Date    Arrhythmia 2021    RBBB    Atrial enlargement, bilateral Echo result 04/04/19    Bilateral cataracts     Son reports pt has the ability to lay flat for 45+min without CP or SOB as reported on 05/14/22 for DOS  06/04/22    CAD (coronary artery disease) 05/28/19 Cath results.    Severe 3V CAD.  Patent LIMA to LAD, patent SVG to OM 1 and OM 2 of circumflex CA, & patent SVG to RCA.     Chronic combined systolic and diastolic heart failure 310/21 OV Dr. Penny Pia    EF=30%    Dementia 04/2014    Difficulty walking 06/2016    Post surgery of lt knee, rt femur/ uses walker and wheelchair for long distances prn    Disorder of prostate     Dyslipidemia 310/21 OV Dr. Penny Pia    Dyspnea on exertion 02/2018    occas per son report on 05/14/22, doing better    Hearing loss     mild . does not like to wear/ bilat    HTN (hypertension) 310/21 OV Dr. Penny Pia    Son reports unaware of htn dx as reported on 3/114/24    Low back pain     muscular    Primary cardiomyopathy 11/2017    Urinary incontinence     occas  2-3 monthly at qhs or in am    VTE (venous thromboembolism) Feb 2023    In lungs, combined with bronchitis     Past Surgical History:   Procedure Laterality Date    COLONOSCOPY, DIAGNOSTIC (SCREENING)      CORONARY ARTERY BYPASS GRAFT  2003     approx year: LIMA to LAD,  SVG to OM 1 & OM 2 of circumflex CA, & SVG to RCA    FRACTURE SURGERY Right 06/2016    femur    HERNIORRHAPHY, INGUINAL Right 04/29/2021    Procedure: OPEN RIGHT HERNIORRHAPHY, INGUINAL WITH MESH;  Surgeon: Towanda Octave, MD;  Location: [facility];  Service: [name];  Laterality: Right;    PERCUTANEOUS VALVULOPLASTY - AORTIC N/A 07/12/2019    Procedure: TAVR #2;  Surgeon: Arlyss Queen, MD PhD;  Location: [facility];  Service: Cardiovascular;  Laterality: N/A;  23 HR OB    REPLACEMENT, AORTIC VALVE , EDWARDS SAPIEN N/A 07/12/2019    Procedure: REPLACEMENT, AORTIC VALVE , EDWARDS SAPIEN -- TAVR Valve:   S3 29mm -- Access: RTF -- IC: Dr. Penny Pia;  Surgeon: Christene Lye, MD;  Location: [facility] OR;  Service: Cardiothoracic;  Laterality: N/A;    RIGHT & LEFT HEART CATH POSSIBLE PCI N/A 05/26/2019    Procedure: RIGHT & LEFT HEART CATH POSSIBLE PCI;  Surgeon: Arlyss Queen, MD PhD;  Location: [facility];  Service: Cardiovascular;  Laterality: N/A;  [id number] scheduled with Maira in dr's office. OP. kr    TOTAL KNEE  ARTHROPLASTY Left 2017    TOTAL KNEE ARTHROPLASTY Right 2005       SOCIAL HISTORY:     Social History     Tobacco Use    Smoking status: Never    Smokeless tobacco: Never   Substance Use Topics    Alcohol use: Never  FAMILY HISTORY:   family history includes No known problems in his brother, father, mother, and sister.    ALLERGIES:   No Known Allergies     MEDICATIONS:    INFUSION MEDS:      sodium chloride 75 mL/hr at 08/24/22 2359      SCHEDULED MEDS:     Current Facility-Administered Medications   Medication Dose Route Frequency    docusate sodium  100 mg Oral BID    empagliflozin  10 mg Oral Daily    heparin (porcine)  5,000 Units Subcutaneous Q12H SCH    memantine  5 mg Oral BID    rosuvastatin  5 mg Oral Daily    sacubitril-valsartan  0.5 tablet Oral BID    tamsulosin  0.4 mg Oral QPM      PRN MEDS:   acetaminophen, benzocaine-menthol, benzonatate, carboxymethylcellulose sodium, dextrose **OR** dextrose **OR** dextrose **OR** glucagon (rDNA), melatonin, naloxone, saline     DIAGNOSTICS:   Telemetry: sinus bradycardia into 30s and 40s, possible intermittent Mobitz II.  2.9 second pause at 9:05.     9:05    EKG 08/24/22 14:23: sinus bradycardia with 2nd degree Mobitz I, RBBB and Left anterior fascicular block    EKG 08/24/22 13:01: sinus bradycardia with frequent PVCs, left axis deviation and RBBB    Echocardiogram 04/09/21:   * The left ventricular cavity size is dilated.  Left ventricular wall  thickness is normal.  Left ventricular systolic function is severely decreased  with an estimated ejection fraction of 25-30%.    * The right ventricular cavity size is upper limits of normal in size.  Mildly decreased right ventricular systolic function.    * Severe bi-atrial dilation.    * There is a 29mm Sapien 3 TAVR valve in aortic position that is normally  functioning.    * There is severe tricuspid regurgitation.  Moderate pulmonary hypertension  with estimated right ventricular systolic pressure of 54 mmHg.     * There is moderate pulmonic valve regurgitation.    * There is mild to moderate mitral regurgitation.    * Elevated right atrial pressure.    * Compared to the prior study dated 08/25/2019 (personally reviewed),  tricuspid regurgitation increased. There are frequent PVC's throughout the  length of this study. Otherwise, no significant changes.    TTE(09/05/2019):  Mildly dilated LV, LVEF 30%, mildly increased LVH,LVOT VTI is low consistent with low stroke volume (58mL) and cardiac output (3.3L/min).  Stroke volume index 12mL/m2, mildly dilated RV cavity with decreased systolic function, RV FAC 11.2%, RV S' 6.6cm/s.  TAPSE 16mm. known 29mm Sapien 3 TAVR valve in aortic position. It appears well seated. DVI 0.3 and EOA 1.3cm2. These values are derived from a low stroke volume (low LVOT VTI) and may reflect an underestimation of valve area due to low output state.  Mild MR, mild-moderate TR, moderate pHTN, estimated RVSP 46 mmHg, dilated ascending aorta compared to previous study 07/13/2019, TAVR hemodynamics have changed, but may be underestimated due to low output state.     Severe AS s/p TAVR 07/12/2019     CAD s/p  CABG in 2003.   Cardiac cath  06/2019 showed patent LIMA to LAD, patent SVG to OM1, patent SVG to OM2, and patent SVG to RCA.     LABS:     Recent Labs   Lab 08/25/22  0345 08/24/22  1447   WBC 3.51 3.77   Hemoglobin 11.3* 12.5   Hematocrit 34.9* 38.2  Platelet Count 72* 78*     Recent Labs   Lab 08/25/22  0345 08/24/22  1447   Sodium 143 140   Potassium 4.3 5.4*   Chloride 114* 109   CO2 25 26   BUN 45* 56*   Creatinine 1.9* 2.5*   GFR 32.1* 23.1*   Glucose 113* 79   Calcium 9.3 10.2                FLUID BALANCE AND WEIGHT:                                           Last filed weight: Weight: 70 kg (154 lb 5.2 oz)  Weight on Admission: Weight: 70 kg (154 lb 5.2 oz)  Net IO Since Admission: -260 mL [08/25/22 1156]     Intake/Output Summary (Last 24 hours) at 08/25/2022 0700  Last data filed at 08/25/2022  0400  Gross per 24 hour   Intake 240 ml   Output 500 ml   Net -260 ml   Weight change:     Last 3 Weights for the past 72 hrs (Last 3 readings):   Weight   08/25/22 0412 70 kg (154 lb 5.2 oz)   08/24/22 2207 70 kg (154 lb 5.2 oz)        PHYSICAL EXAM:                                                                 BP 135/61   Pulse 60   Temp 97.5 F (36.4 C) (Oral)   Resp 16   Ht 1.727 m (5\' 8" )   Wt 70 kg (154 lb 5.2 oz)   SpO2 98%   BMI 23.46 kg/m     [name] Appearance:  Breathing comfortable, no acute distress  Head:  normocephalic  Eyes:  EOM's intact, nonicteric sclera  Neck:  No carotid bruit or jugular venous distension  Lungs:  Clear to auscultation upper lobes, mild rales in middle lobes and diminished in bases bilaterally, no wheezes, rhonchi or rales, good respiratory effort, symmetric chest expansion  Cardiac:  RRR, normal S1, S2, no S3, no S4, no rub, Left upper and lower grade 2/6 systolic murmur,left lower border click, PMI holosystolic grade 3/4 murmur   Abdomen:  Soft, non-tender, no rebound or guarding, non-distended, positive bowel sounds  Extremities:  No cyanosis or clubbing. No arthritis or deformities. No LE edema  Vascular: 2+ radial and distal pulses bilaterally  Neurologic:  Alert and oriented x2, mood and affect normal

## 2022-08-25 NOTE — Plan of Care (Signed)
NURSING SHIFT NOTE     Patient: Rick [name]  Day: 1      SHIFT EVENTS   Patient remained free from falls and injury throughout shift.   Safety and fall precautions remain in place.   Purposeful rounding completed.        ASSESSMENT     Changes in assessment from patient's baseline this shift:    Neuro: No  X 1, self. Fatigued.   CV: No  Frequent assessment done, per telemetry patient having PVC, pauses, BBB, EKG done around 0920 Sinus Bradycardia w/ occasional PVC, Right BBB, patient denies any chest pain, palpitations, dizziness, diaphoresis, SOB, or nausea.  Pulm: No  Peripheral Vascular: No  HEENT: No  GI: No  Good appetite, feeder.   BM during shift: No, Last BM: Last BM Date: 08/24/22  GU: No   Integ: No  MS: No  Pain: None  Pain Interventions: None  Medications Utilized: N/A  Mobility: PMP Activity: Step 4 - Dangle at Bedside of Distance Walked (ft) (Step 6,7): 0 Feet           Lines     Patient Lines/Drains/Airways Status       Active Lines, Drains and Airways       Name Placement date Placement time Site Days    Peripheral IV 08/24/22 20 G Anterior;Distal;Left Upper Arm 08/24/22  2300  Upper Arm  less than 1    External Urinary Catheter 08/25/22  1530  --  less than 1                         VITAL SIGNS   Visit Vitals  BP 171/77   Pulse 60   Temp 97.7 F (36.5 C) (Oral)   Resp 16   Ht 1.727 m (5\' 8" )   Wt 70 kg (154 lb 5.2 oz)   SpO2 100%   BMI 23.46 kg/m     Intake/Output Summary (Last 24 hours) at 08/25/2022 1813  Last data filed at 08/25/2022 1727  Gross per 24 hour   Intake 906 ml   Output 2700 ml   Net -1794 ml          CARE PLAN       Problem: Compromised Moisture  Goal: Moisture level Interventions  Outcome: Progressing  Flowsheets (Taken 08/25/2022 0800)  Moisture level Interventions: Moisture wicking products, Moisture barrier cream     Problem: Compromised Activity/Mobility  Goal: Activity/Mobility Interventions  Outcome: Progressing  Flowsheets (Taken 08/25/2022 0800)  Activity/Mobility  Interventions: Pad bony prominences, TAP Seated positioning system when OOB, Promote PMP, Reposition q 2 hrs / turn clock, Offload heels     Problem: Moderate/High Fall Risk Score >5  Goal: Patient will remain free of falls  Outcome: Progressing  Flowsheets (Taken 08/25/2022 0800)  High (Greater than 13):   HIGH-Visual cue at entrance to patient's room   HIGH-Bed alarm on at all times while patient in bed   HIGH-Utilize chair pad alarm for patient while in the chair   HIGH-Apply yellow "Fall Risk" arm band   HIGH-Pharmacy to initiate evaluation and intervention per protocol   HIGH-Initiate use of floor mats as appropriate   HIGH-Consider use of low bed     Problem: Everyday - Heart Failure  Goal: Stable Vital Signs and Fluid Balance  Outcome: Progressing  Flowsheets (Taken 08/25/2022 0249 by Carnella Guadalajara, RN)  Stable Vital Signs and Fluid Balance:   Daily Standing Weights in the  morning using the same scale, after using the bathroom and before breadfast.  If unable to stand, zero the bed and use the bed scale   Fluid Restriction   Assess for swelling/edema   Monitor, assess vital signs and telemetry per policy   Monitor labs and report abnormalities to physician  Goal: Mobility/Activity is Maintained at Optimal Level for Patient  Outcome: Progressing  Flowsheets (Taken 08/25/2022 1051)  Mobility/Activity is Maintained at Optimal Level for Patient:   Increase mobility as tolerated/progressive mobility protocol   Maintain SCD's as Ordered   Perform active/passive ROM   Reposition patient every 2 hours and as needed unless able to reposition self   Assess for changes in respiratory status, level of consciousness and/or development of fatigue   Consult with Physical Therapy and/or Occupational Therapy  Goal: Nutritional Intake is Adequate  Outcome: Progressing  Flowsheets (Taken 08/25/2022 1051)  Nutritional Intake is Adequate: Fluid Restricction if needed     Problem: Hemodynamic Status: Cardiac  Goal: Stable vital  signs and fluid balance  Outcome: Progressing  Flowsheets (Taken 08/25/2022 0249 by Carnella Guadalajara, RN)  Stable vital signs and fluid balance:   Assess signs and symptoms associated with cardiac rhythm changes   Monitor lab values     Problem: Inadequate Tissue Perfusion  Goal: Adequate tissue perfusion will be maintained  Outcome: Progressing  Flowsheets (Taken 08/25/2022 1051)  Adequate tissue perfusion will be maintained:   Monitor/assess lab values and report abnormal values   Monitor/assess neurovascular status (pulses, capillary refill, pain, paresthesia, paralysis, presence of edema)   Monitor/assess for signs of VTE (edema of calf/thigh redness, pain)   Monitor for signs and symptoms of a pulmonary embolism (dyspnea, tachypnea, tachycardia, confusion)   Encourage/assist patient as needed to turn, cough, and perform deep breathing every 2 hours     Problem: Ineffective Gas Exchange  Goal: Effective breathing pattern  Outcome: Progressing  Flowsheets (Taken 08/25/2022 1051)  Effective breathing pattern:   Maintain CO2 level per LIP order   Monitor end tidal CO2 level per LIP order   Teach/reinforce use of ordered respiratory interventions (ie. CPAP, BiPAP, Incentive Spirometer, Acapella)     Problem: Pain interferes with ability to perform ADL  Goal: Pain at adequate level as identified by patient  Outcome: Progressing  Flowsheets (Taken 08/25/2022 0249 by Carnella Guadalajara, RN)  Pain at adequate level as identified by patient:   Identify patient comfort function goal   Offer non-pharmacological pain management interventions   Evaluate patient's satisfaction with pain management progress   Evaluate if patient comfort function goal is met     Problem: Side Effects from Pain Analgesia  Goal: Patient will experience minimal side effects of analgesic therapy  Outcome: Progressing  Flowsheets (Taken 08/25/2022 1051)  Patient will experience minimal side effects of analgesic therapy:   Monitor/assess patient's  respiratory status (RR depth, effort, breath sounds)   Prevent/manage side effects per LIP orders (i.e. nausea, vomiting, pruritus, constipation, urinary retention, etc.)   Evaluate for opioid-induced sedation with appropriate assessment tool (i.e. POSS)   Assess for changes in cognitive function

## 2022-08-25 NOTE — Consults (Signed)
[facility]  [phone]  [email or web address]     Inpatient Contacts  Hospital:   [phone] [facility])  After Hours:   [phone]  EpicChat: [facility] EP/ARRHYTHMIA Inpatient     Assessment / Plan  89 y.o. male with history of severe AS s/p TAVR, severe TR, moderate PR, dementia, monoclonal gammopathy, CAD, mixed CM, HTN, presenting with hypotension.     Bradycardia  Frequent PVCs  Mobitz I SA block  Mobitz I AV block   Mobitz I SA block and Mobitz I AV block. Hemodynamically stable. His SA/AVB block is unlikely to be life threatening for him and he does not have history of presyncope/syncope. He does not have symptoms related to these issues. Would ideally suppress his high burden of PVCs in light of his CM, but with his bradycardia and AV block he does not have good medication options. In discussion with his son, we agreed on a less is more plan, focused on QOL. I did not recommend ICD given his competing risks for mortality and focus on QOL.     Recommendations  No change in plan  Okay for discharge from EP perspective     We will sign off at this time. Please re-consult if additional issues arise.                       __________________________________________    HPI / Hospital Course  History through son, Coltin. QOL issues are related to his eye sight only. Breathing has been good recently. Denies fluid retention. Son does not feel his QOL is limited by cardiopulmonary symptoms. He is very active when participating in physical therapy. He has not had presyncope or syncope. Denies any chest pain. Denies f/c/n/v.       Vitals  BP: 143/77 (08/25/2022  3:26 PM)  Temp: 97.7 F (36.5 C) (08/25/2022  3:26 PM)  Temp Source: Oral (08/25/2022  3:26 PM)  Heart Rate: 64 (08/25/2022  3:26 PM)  Resp Rate: 16 (08/25/2022  3:26 PM)  SpO2: 100 % (08/25/2022  3:26 PM)  Height: 1.727 m (5\' 8" ) (08/24/2022 10:07 PM)  Weight: 70 kg (154 lb 5.2 oz) (08/25/2022  4:12 AM)        ECG (08/24/2022): NSR, Mobitz 1 AV block,  RBBB  ECG (08/24/2022): NSR, RBBB, PVC bigeminy    Telemetry: NSR, SA block (Mobitz I), frequent PVCs. Pauses in the setting of ?blocked PACs vs Mobitz I AVB    TTE (03/2021):  The left ventricular cavity size is dilated.  Left ventricular wall thickness is normal.  Left ventricular systolic function is severely decreased with an estimated ejection fraction of 25-30%.  The right ventricular cavity size is upper limits of normal in size. Mildly decreased right ventricular systolic function.  Severe bi-atrial dilation.  There is a 29mm Sapien 3 TAVR valve in aortic position that is normally functioning.  There is severe tricuspid regurgitation.  Moderate pulmonary hypertension with estimated right ventricular systolic pressure of 54 mmHg.  There is moderate pulmonic valve regurgitation.  There is mild to moderate mitral regurgitation.  Elevated right atrial pressure.  Compared to the prior study dated 08/25/2019 (personally reviewed), tricuspid regurgitation increased. There are frequent PVC's throughout the length of this study. Otherwise, no significant changes.

## 2022-08-25 NOTE — Plan of Care (Incomplete)
Pt is alert and oriented to self, follows commands. He is calm and cooperative. SBP in 160-170s mmHg, in sinus rhythm w/ frequent PVCs. . On RA SpO2 >95%. RVM in room. Patient pulled his external catheter once and was redirected. At 2315, RN was notified by sitter for Rm 2121B that patient pulled his external catheter again. Linens soaked with urine. Patient is restless, states that he will report RN and clin tech to police for undressing him. RN reoriented patient. Patient bathed and linens changed. Melatonin given. Comfort measures provided. No significant event overnight. Plan of care discussed with patient at start of shift; all concerns addressed. Safety bundle maintained. Purposeful hourly rounding completed.     Problem: Moderate/High Fall Risk Score >5  Goal: Patient will remain free of falls  Outcome: Progressing  Flowsheets (Taken 08/25/2022 2000)  High (Greater than 13): (RVM in room)   HIGH-Consider use of low bed   HIGH-Initiate use of floor mats as appropriate   HIGH-Pharmacy to initiate evaluation and intervention per protocol   HIGH-Apply yellow "Fall Risk" arm band   HIGH-Utilize chair pad alarm for patient while in the chair   HIGH-Bed alarm on at all times while patient in bed   HIGH-Visual cue at entrance to patient's room   MOD-Include family in multidisciplinary POC discussions   MOD-Request PT/OT consult order for patients with gait/mobility impairment   MOD-Perform dangle, stand, walk (DSW) prior to mobilization   MOD-Place Fall Risk level on whiteboard in room   MOD-Utilize diversion activities   MOD-Re-orient confused patients   MOD-Use of assistive devices -Bedside Commode if appropriate   MOD-Remain with patient during toileting   MOD-Consider a move closer to Nurses Station   MOD-Use of chair-pad alarm when appropriate   LOW-Anticoagulation education for injury risk   LOW-Fall Interventions Appropriate for Low Fall Risk     Problem: Day of Admission - Heart Failure  Goal: Heart Failure  Admission  Outcome: Progressing     Problem: Everyday - Heart Failure  Goal: Stable Vital Signs and Fluid Balance  Outcome: Progressing  Flowsheets (Taken 08/25/2022 2205)  Stable Vital Signs and Fluid Balance:   Daily Standing Weights in the morning using the same scale, after using the bathroom and before breadfast.  If unable to stand, zero the bed and use the bed scale   Monitor, assess vital signs and telemetry per policy   Monitor labs and report abnormalities to physician   Strict Intake/Output   Wean oxygen as needed if appropriate   Assess for swelling/edema   Fluid Restriction  Goal: Mobility/Activity is Maintained at Optimal Level for Patient  Outcome: Progressing  Flowsheets (Taken 08/25/2022 2205)  Mobility/Activity is Maintained at Optimal Level for Patient:   Increase mobility as tolerated/progressive mobility protocol   Maintain SCD's as Ordered   Perform active/passive ROM   Reposition patient every 2 hours and as needed unless able to reposition self   Assess for changes in respiratory status, level of consciousness and/or development of fatigue   Consult with Physical Therapy and/or Occupational Therapy  Goal: Nutritional Intake is Adequate  Outcome: Progressing  Flowsheets (Taken 08/25/2022 2205)  Nutritional Intake is Adequate:   Cardiac diet-2 gm Sodium   Fluid Restricction if needed   Consult/Collaborate with Nutritionist   Patient and family teaching on low sodium diet   Assess appetite,anorexia and amount of meal/food tolerated   Encourage/perform oral hygiene as appropriate  Goal: Teaching-Using CHF Warning Zones and Educational Videos  Outcome: Progressing  Flowsheets (Taken 08/25/2022 2205)  Teaching-Using CHF Warning Zones and Educational Videos:   Signs & Symptoms of CHF   Daily Standing Weights & record   CHF Warning Zones and when to call for help   Medications   Document in the Education Tab in EPIC with Teach-back   Fluid Restriction if appropriate   Sodium Restriction     Problem:  Hemodynamic Status: Cardiac  Goal: Stable vital signs and fluid balance  Outcome: Progressing  Flowsheets (Taken 08/25/2022 2205)  Stable vital signs and fluid balance:   Monitor lab values   Assess signs and symptoms associated with cardiac rhythm changes     Problem: Inadequate Tissue Perfusion  Goal: Adequate tissue perfusion will be maintained  Outcome: Progressing  Flowsheets (Taken 08/25/2022 2205)  Adequate tissue perfusion will be maintained:   Monitor/assess lab values and report abnormal values   Monitor/assess neurovascular status (pulses, capillary refill, pain, paresthesia, paralysis, presence of edema)   Monitor/assess for signs of VTE (edema of calf/thigh redness, pain)   Monitor for signs and symptoms of a pulmonary embolism (dyspnea, tachypnea, tachycardia, confusion)   Encourage/assist patient as needed to turn, cough, and perform deep breathing every 2 hours

## 2022-08-25 NOTE — OT Eval Note (Signed)
Occupational Therapy Eval Rick [name]        Post Acute Care Therapy Recommendations   Discharge Recommendations:  Home with supervision, Home with home health OT, Home with home health PT (Per family report pt has 24/7 assist and supervision available through [facility] 5 days per week morning hours  and when son is available. Son assists with transfers and ADLs the rest of the time.)    DME needs IF patient is discharging home: Shower chair    Therapy discharge recommendations may change with patient status.  Please refer to most recent note for up-to-date recommendations.    Unit: [facility] UN[address] [facility]  Bed: A2121/A2121-A      ___________________________________________________    Time of Evaluation:  Time Calculation  OT Received On: 08/25/22  Start Time: 1539  Stop Time: 1619  Time Calculation (min): 40 min  Total Treatment Time (min): 20    Chart Review and Collaboration with Care Team: 10 minutes, not included in above time.    OT Visit Number: 1    Consult received for Rick [name] for OT Evaluation and Treatment.  Patient's medical condition is appropriate for Occupational therapy intervention at this time.      Activity Orders:  OT eval and treat    Precautions and Contraindications:  Precautions  Weight Bearing Status: no restrictions  Fall Risks: High, Impaired balance/gait, Impaired mobility, Impaired vision, Muscle weakness  Other Precautions: Dementia    Personal Protective Equipment (PPE)  gloves    Medical Diagnosis:  AKI (acute kidney injury) [N17.9]  Acute kidney failure [N17.9]    History of Present Illness: Rick [name] is a 89 y.o. male admitted on 08/24/2022 with a PMHx of dementia, monoclonal gammopathy, HFrEF with EF 30%, CAD, hypertension and hyperlipidemia who presented to the ED as a transfer from [facility] for the evaluation of hypotension. Please note patient is a poor historian, no family is at bedside thus information obtained is limited and  mostly per chart review. He was seen by his PCP today and was noted to be very hypotensive with a BP of 90/40 thus he was sent to the ED for further evaluation.     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    Dry cough    Aortic valve stenosis, etiology of cardiac valve disease unspecified    S/P TAVR (transcatheter aortic valve replacement)    Difficulty in walking, not elsewhere classified    Dementia without behavioral disturbance    Balance disorder    Chronic systolic CHF (congestive heart failure)    Acute pulmonary embolism with acute cor pulmonale, unspecified pulmonary embolism type    Pulmonary embolism with acute cor pulmonale, unspecified chronicity, unspecified pulmonary embolism type    Incarcerated inguinal hernia    Acute renal failure with tubular necrosis    Vascular dementia    Thrombocytopenia    Smoldering myeloma    Artificial knee joint present    Benign prostatic hyperplasia    Fracture of femur    Hallux valgus (acquired), right foot    History of total right knee replacement    Hypertension    IgG monoclonal gammopathy of uncertain significance    Loose  total knee arthroplasty    Nocturia    Old myocardial infarction    Osteoarthritis of left knee    Other hammer toe(s) (acquired), left foot    Other hammer toe(s) (acquired), right foot    Other spondylosis with myelopathy, cervical region    URI with cough and congestion    Chronic bilateral low back pain without sciatica    Combined forms of age-related cataract of right eye    Combined forms of age-related cataract of left eye    Stage 3b chronic kidney disease    AKI (acute kidney injury)    Acute kidney failure       Past Medical/Surgical History:  Past Medical History:    Diagnosis Date    Arrhythmia 2021    RBBB    Atrial enlargement, bilateral Echo result 04/04/19    Bilateral cataracts     Son reports pt has the ability to lay flat for 45+min without CP or SOB as reported on 05/14/22 for DOS  06/04/22    CAD (coronary artery disease) 05/28/19 Cath results.    Severe 3V CAD.  Patent LIMA to LAD, patent SVG to OM 1 and OM 2 of circumflex CA, & patent SVG to RCA.    Chronic combined systolic and diastolic heart failure 310/21 OV Dr. Penny Pia    EF=30%    Dementia 04/2014    Difficulty walking 06/2016    Post surgery of lt knee, rt femur/ uses walker and wheelchair for long distances prn    Disorder of prostate     Dyslipidemia 310/21 OV Dr. Penny Pia    Dyspnea on exertion 02/2018    occas per son report on 05/14/22, doing better    Hearing loss     mild . does not like to wear/ bilat    HTN (hypertension) 310/21 OV Dr. Penny Pia    Son reports unaware of htn dx as reported on 3/114/24    Low back pain     muscular    Primary cardiomyopathy 11/2017    Urinary incontinence     occas  2-3 monthly at qhs or in am    VTE (venous thromboembolism) Feb 2023    In lungs, combined with bronchitis      Past Surgical History:   Procedure Laterality Date    COLONOSCOPY, DIAGNOSTIC (SCREENING)      CORONARY ARTERY BYPASS GRAFT  2003     approx year: LIMA to LAD,  SVG to OM 1 & OM 2 of circumflex CA, & SVG to RCA    FRACTURE SURGERY Right 06/2016    femur    HERNIORRHAPHY, INGUINAL Right 04/29/2021    Procedure: OPEN RIGHT HERNIORRHAPHY, INGUINAL WITH MESH;  Surgeon: Towanda Octave, MD;  Location: [facility];  Service: General;  Laterality: Right;    PERCUTANEOUS VALVULOPLASTY - AORTIC N/A 07/12/2019    Procedure: TAVR #2;  Surgeon: Arlyss Queen, MD PhD;  Location: [facility];  Service: Cardiovascular;  Laterality: N/A;  23 HR OB    REPLACEMENT, AORTIC VALVE , EDWARDS SAPIEN N/A 07/12/2019    Procedure: REPLACEMENT, AORTIC VALVE , EDWARDS SAPIEN -- TAVR Valve:   S3 29mm -- Access: RTF -- IC:  Dr. Penny Pia;  Surgeon: Christene Lye, MD;  Location: [facility] OR;  Service: Cardiothoracic;  Laterality: N/A;    RIGHT & LEFT HEART CATH POSSIBLE PCI N/A 05/26/2019    Procedure: RIGHT & LEFT HEART CATH POSSIBLE PCI;  Surgeon: Arlyss Queen,  MD PhD;  Location: [facility];  Service: Cardiovascular;  Laterality: N/A;  [id number] scheduled with Maira in dr's office. OP. kr    TOTAL KNEE ARTHROPLASTY Left 2017    TOTAL KNEE ARTHROPLASTY Right 2005         X-Rays/Tests/Labs:  Lab Results   Component Value Date/Time    HGB 11.3 (L) 08/25/2022 03:45 AM    HGB 12.9 06/18/2022 09:04 AM    HCT 34.9 (L) 08/25/2022 03:45 AM    HCT 40.0 06/18/2022 09:04 AM    K 4.3 08/25/2022 03:45 AM    K 5.2 06/18/2022 09:04 AM    NA 143 08/25/2022 03:45 AM    NA 145 06/18/2022 09:04 AM    INR 1.2 (H) 07/10/2019 02:22 PM    TROPI 17.6 04/27/2021 06:14 PM    TROPI 26.2 04/09/2021 01:10 AM    TROPI 33.2 04/08/2021 02:33 PM       All imaging reviewed, please see chart for details.    Social History:  Prior Level of Function  Prior level of function: Needs assistance with ADLs, Ambulates with assistive device  Assistive Device: Front wheel walker  Baseline Activity Level: Household ambulation  Ambulated 100 feet or more prior to admission: No  Driving: does not drive  Dressing - Upper Body: maximal assist  Dressing - Lower Body: maximal assist  Cooking: dependent  Feeding: minimal assist  Bathing: maximal assist  Grooming: minimal assist  Toileting: supervision  Employment: Disabled  DME Currently at Home: Environmental consultant, [employer] Living Arrangements  Living Arrangements: Children (Adult son)  Type of Home: House  Home Layout: Two level, Engineer, structural, Bed/bath upstairs  Bathroom Shower/Tub: Walk-in Physiological scientist: Midwife: Grab bars in shower  DME Currently at Home: Environmental consultant, [employer] [facility] - Notes / Comments: Patients son reports that patient lives with son and daughter in Social worker. Son works from home  and is able to provide supervision  as able. However pt has caregiver support in the mornings 5 days per week and when son is not available to assist with ADLs. Son assists with ADLs as needed the rest of the time.    Subjective:      Patient is agreeable to participation in the therapy session.  Patient Goal: none stated  Pain Assessment  Pain Assessment: No/denies pain      Objective:   Inspection/Posture  Inspection/Posture: pt received supine in bed    Observation of Patient/Vital Signs:  Vitals:    08/25/22 1526   BP: 143/77   Pulse: 64   Resp: 16   Temp: 97.7 F (36.5 C)   SpO2: 100%         Cognitive Status and Neuro Exam:  Cognition/Neuro Status  Arousal/Alertness: Generalized responses  Attention Span: Difficulty attending to directions  Orientation Level: Oriented to person, Oriented to place, Disoriented to time, Disoriented to situation  Memory: Decreased recall of biographical information, Decreased recall of recent events, Decreased long term memory, Decreased short term memory  Following Commands: minimal verbal instruction  Insights: Not aware of deficits  Problem Solving: Assistance required to identify errors made, Assistance required to generate solutions, Assistance required to implement solutions, maximum assistance  Behavior: calm, cooperative  Motor Planning: intact    Musculoskeletal Examination  Gross ROM  Right Upper Extremity ROM: within functional limits  Left Upper Extremity ROM: within functional limits  Gross Strength  Left Upper Extremity Strength: 3+/5  Right Lower  Extremity Strength: 3+/5        Activities of Daily Living  Self-care and Home Management  Eating: Minimal Assist (assist to help scoop the food 2/2 pt unable to see 2/2 cataracts.)    Functional Mobility:  Mobility and Transfers  Scooting to HOB: Maximal Assist  Scooting to EOB: Maximal Assist  Supine to Sit: Minimal Assist  Sit to Supine: Minimal Assist  Sit to Stand: Moderate Assist  Functional Mobility/Ambulation:  Unable to assess (Comment)  Functional Mobility Deferred (Comment): posterior leaning while standing     Balance  Balance  Static Sitting Balance: Stand by Assist  Dyanamic Sitting Balance: Stand by Assist  Static Standing Balance: Moderate Assist    Participation and Activity Tolerance  Participation and Endurance  Participation Effort: good  Endurance: Tolerates < 10 min exercise, no significant change in vital signs    Educated the Patient and Family to role of occupational therapy, plan of care, goals of therapy and safety with mobility and ADLs with verbalized understanding .    Patient left in bed with alarm and all other medical equipment in place and call bell and all personal items/needs within reach.  RN notified of session outcome.        Assessment:  Rick [name] is a 89 y.o. male admitted 08/24/2022. OT Assessment: visual perceptual deficits;decreased independence with ADLs;decreased endurance/activity tolerance    Pt received supine in bed, HOB elevated, A&Ox2 (self and location only), pleasantly confused. Pt completed bed mobility with Min A w/ additional time and Vcs.  Sit <>stand completed with Mod A, however posterior lean noted limiting further mobility. Pt was returned to bed and engaged in eating activity with HOB elevated. Pt noted with difficulty scanning and finding items on tray table requiring Min A for eating activity for hand over hand assist scoop food on spoon. Per son report pt has had difficulty with eating 2/2 cataracts and is pending surgery. Son reported pt was Min A to Sup level of functional transfers at baseline, and Max A fpr all ADLs except toile ting, which he could perform at supervision level. Pt would benefit from acute care Ot services while hospitalized followed by [place] home with continued 24/7 supervision and ADL assist, and HH OT/PT in order to return to baseline level of independence.     Rehabilitation Potential:  Prognosis: Fair    Plan:  OT Frequency  Recommended: 2-3x/wk   Treatment Interventions: ADL retraining, Functional transfer training     PMP Activity: Step 4 - Dangle at Bedside  Distance Walked (ft) (Step 6,7): 0 Feet      Risks/benefits/POC discussed    Goals:  Time For Goal Achievement: by time of discharge  ADL Goals  Patient will toilet: Minimal Assist  Mobility and Transfer Goals  Pt will transfer bed to [facility]: Maximal Assist                           Alecia Lemming  [id number]  Physical Medicine and Rehabilitation  [facility]    08/25/2022  4:32 PM      [facility]  Patient: Rick [name] MRN#: [id number]   Unit: [facility] UN[address] Bed: A2121/A2121-A

## 2022-08-25 NOTE — PT Eval Note (Addendum)
Physical Therapy Eval and Treatment  Rick [name]      Post Acute Care Therapy Recommendations   Discharge Recommendations:  Home with supervision, Home with home health PT, Home with home health OT (Patient has supervision and support throughout day from son/daughter in law and caregiver)    If recommended discharge disposition is not available, patient will require the following assistance: Supervision, Assist with OOB activity, Assist with I/ADLs, and SNF    DME needs IF patient is discharging home: No additional equipment/DME recommended at this time, Patient already has needed equipment    Therapy discharge recommendations may change with patient status.  Please refer to most recent note for up-to-date recommendations.    Unit: [facility] UN[address]  Bed: A2121/A2121-A    ___________________________________________________    Time of Evaluation and Treatment:  Time Calculation   PT Received On: 08/25/22  Start Time: 1110  Stop Time: 1158  Time Calculation (min): 48 min       Evaluation Time: 10 minutes  Treatment Time: 38 minutes    Chart Review and Collaboration with Care Team: 5 minutes, not included in above time    PT Visit Number: 1    Consult received for Rick [name] for PT Evaluation and Treatment.  Patient's medical condition is appropriate for Physical therapy intervention at this time.    Activity Orders:  PT eval and treat    Precautions and Contraindications:  Precautions  Fall Risks: High, History of fall(s), Impaired mobility, Impaired balance/gait, Muscle weakness  Other Precautions: Dementia    Personal Protective Equipment (PPE)  gloves and procedure mask    Medical Diagnosis:  AKI (acute kidney injury) [N17.9]  Acute kidney failure [N17.9]    History of Present Illness:  Rick [name] is a 89 y.o. male admitted on 08/24/2022  with a PMHx of dementia, monoclonal gammopathy, HFrEF with EF 30%, CAD, hypertension and hyperlipidemia who presented to the ED as a  transfer from [facility] for the evaluation of hypotension.  Please note patient is a poor historian, no family is at bedside thus information obtained is limited and mostly per chart review.  He was seen by his PCP today and was noted to be very hypotensive with a BP of 90/40 thus he was sent to the ED for further evaluati     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    Dry cough    Aortic valve stenosis, etiology of cardiac valve disease unspecified    S/P TAVR (transcatheter aortic valve replacement)    Difficulty in walking, not elsewhere classified    Dementia without behavioral disturbance    Balance disorder    Chronic systolic CHF (congestive heart failure)    Acute pulmonary embolism with acute cor pulmonale, unspecified pulmonary embolism type    Pulmonary embolism with acute cor pulmonale, unspecified chronicity, unspecified pulmonary embolism type    Incarcerated inguinal hernia    Acute renal failure with tubular necrosis    Vascular dementia    Thrombocytopenia    Smoldering myeloma    Artificial knee joint present    Benign prostatic hyperplasia    Fracture of femur    Hallux valgus (acquired), right foot  History of total right knee replacement    Hypertension    IgG monoclonal gammopathy of uncertain significance    Loose total knee arthroplasty    Nocturia    Old myocardial infarction    Osteoarthritis of left knee    Other hammer toe(s) (acquired), left foot    Other hammer toe(s) (acquired), right foot    Other spondylosis with myelopathy, cervical region    URI with cough and congestion    Chronic bilateral low back pain without sciatica    Combined forms of age-related cataract of right eye    Combined forms of age-related  cataract of left eye    Stage 3b chronic kidney disease    AKI (acute kidney injury)    Acute kidney failure       Past Medical/Surgical History:  Past Medical History:   Diagnosis Date    Arrhythmia 2021    RBBB    Atrial enlargement, bilateral Echo result 04/04/19    Bilateral cataracts     Son reports pt has the ability to lay flat for 45+min without CP or SOB as reported on 05/14/22 for DOS  06/04/22    CAD (coronary artery disease) 05/28/19 Cath results.    Severe 3V CAD.  Patent LIMA to LAD, patent SVG to OM 1 and OM 2 of circumflex CA, & patent SVG to RCA.    Chronic combined systolic and diastolic heart failure 310/21 OV Dr. Penny Pia    EF=30%    Dementia 04/2014    Difficulty walking 06/2016    Post surgery of lt knee, rt femur/ uses walker and wheelchair for long distances prn    Disorder of prostate     Dyslipidemia 310/21 OV Dr. Penny Pia    Dyspnea on exertion 02/2018    occas per son report on 05/14/22, doing better    Hearing loss     mild . does not like to wear/ bilat    HTN (hypertension) 310/21 OV Dr. Penny Pia    Son reports unaware of htn dx as reported on 3/114/24    Low back pain     muscular    Primary cardiomyopathy 11/2017    Urinary incontinence     occas  2-3 monthly at qhs or in am    VTE (venous thromboembolism) Feb 2023    In lungs, combined with bronchitis     Past Surgical History:   Procedure Laterality Date    COLONOSCOPY, DIAGNOSTIC (SCREENING)      CORONARY ARTERY BYPASS GRAFT  2003     approx year: LIMA to LAD,  SVG to OM 1 & OM 2 of circumflex CA, & SVG to RCA    FRACTURE SURGERY Right 06/2016    femur    HERNIORRHAPHY, INGUINAL Right 04/29/2021    Procedure: OPEN RIGHT HERNIORRHAPHY, INGUINAL WITH MESH;  Surgeon: Towanda Octave, MD;  Location: [facility];  Service: General;  Laterality: Right;    PERCUTANEOUS VALVULOPLASTY - AORTIC N/A 07/12/2019    Procedure: TAVR #2;  Surgeon: Arlyss Queen, MD PhD;  Location: [facility];  Service: Cardiovascular;  Laterality: N/A;  23 HR  OB    REPLACEMENT, AORTIC VALVE , EDWARDS SAPIEN N/A 07/12/2019    Procedure: REPLACEMENT, AORTIC VALVE , EDWARDS SAPIEN -- TAVR Valve:   S3 29mm -- Access: RTF -- IC: Dr. Penny Pia;  Surgeon: Christene Lye, MD;  Location: [facility] OR;  Service: Cardiothoracic;  Laterality: N/A;    RIGHT & LEFT  HEART CATH POSSIBLE PCI N/A 05/26/2019    Procedure: RIGHT & LEFT HEART CATH POSSIBLE PCI;  Surgeon: Arlyss Queen, MD PhD;  Location: [facility];  Service: Cardiovascular;  Laterality: N/A;  [id number] scheduled with Maira in dr's office. OP. kr    TOTAL KNEE ARTHROPLASTY Left 2017    TOTAL KNEE ARTHROPLASTY Right 2005       X-Rays/Tests/Labs:  Lab Results   Component Value Date/Time    HGB 11.3 (L) 08/25/2022 03:45 AM    HGB 12.9 06/18/2022 09:04 AM    HCT 34.9 (L) 08/25/2022 03:45 AM    HCT 40.0 06/18/2022 09:04 AM    K 4.3 08/25/2022 03:45 AM    K 5.2 06/18/2022 09:04 AM    NA 143 08/25/2022 03:45 AM    NA 145 06/18/2022 09:04 AM    INR 1.2 (H) 07/10/2019 02:22 PM    TROPI 17.6 04/27/2021 06:14 PM    TROPI 26.2 04/09/2021 01:10 AM    TROPI 33.2 04/08/2021 02:33 PM       All imaging reviewed, please see chart for details.    Social History:  Prior Level of Function  Prior level of function: Needs assistance with ADLs, Ambulates with assistive device  Assistive Device: Front wheel walker  Baseline Activity Level: Household ambulation  Ambulated 100 feet or more prior to admission: No  Driving: does not drive  DME Currently at Home: Environmental consultant, [employer] Living Arrangements  Living Arrangements: Children  Type of Home: House  Home Layout: Two level, Engineer, structural, Bed/bath upstairs (No STE, elevator access to bed/bath)  Bathroom Shower/Tub: Pension scheme manager: Midwife: Grab bars in shower  DME Currently at Home: Environmental consultant, [employer] - Notes / Comments: Patients son reports that patient lives with son and daughter in Social worker. Son works from home and is able to provide  supervision  as able. However pt has caregiver support when son is not available to assist with ADLs. (prior level of function obtained from son over the phone as patient is poor historian 2/2 dementia)      Subjective:  Patient is agreeable to participation in the therapy session.  Patient Goal: To get better  Pain Assessment  Pain Assessment: No/denies pain      Objective:  Observation of Patient/Vital Signs:  Vitals:    08/25/22 1526   BP: 143/77   Pulse: 64   Resp: 16   Temp: 97.7 F (36.5 C)   SpO2: 100%        Inspection/Posture: pt received supine in bed    Cognitive Status and Neuro Exam:  Cognition/Neuro Status  Arousal/Alertness: Delayed responses to stimuli  Attention Span: Appears intact  Orientation Level: Oriented to person;Oriented to place;Disoriented to situation;Disoriented to time (Pt reports that it is November)  Memory: Decreased recall of recent events  Following Commands: Follows one step commands with increased time;Follows one step commands with repetition  Safety Awareness: maximal verbal instruction  Insights: Decreased awareness of deficits  Problem Solving: Able to problem solve independently  Behavior: cooperative;calm  Motor Planning: intact  Coordination: GMC impaired    Musculoskeletal Examination  Gross ROM  Right Upper Extremity ROM: within functional limits  Left Upper Extremity ROM: within functional limits  Right Lower Extremity ROM: within functional limits  Left Lower Extremity ROM: within functional limits  Gross Strength  Right Upper Extremity Strength: within functional limits  Left Upper Extremity Strength: within functional limits  Right Lower Extremity Strength: 3+/5  Left Lower Extremity Strength: 3+/5       Functional Mobility:  Functional Mobility  Supine to Sit: Increased Time;Increased Effort;HOB raised;Moderate Assist  Scooting to [facility]: Dependent;Increased Time;Increased Effort (x2 person assist)  Scooting to EOB: Minimal Assist;Increased Time;Increased Effort  Sit  to Supine: Minimal Assist (for LE assistance)  Sit to Stand: Minimal Assist;Increased Time;Increased Effort;bed elevated (w/ RW)  Stand to Sit: Contact Guard Assist (w/ RW)     Locomotion  Ambulation: Unable to assess (Comment) (Not safe 2/2 poro standing balance)     Balance  Balance  Sitting - Static: Fair (requires b/l UE support to maintain sitting balance)  Sitting - Dynamic: Poor  Standing - Static: Poor (w/ RW support, notable posterior LOB)  Standing - Dynamic: Not tested (2/2 poor static standing balacne)    Participation and Activity Tolerance  Participation and Endurance  Participation Effort: good  Endurance: Tolerates 10 - 20 min exercise with multiple rests  [place] Dyspnea Scale: 1+ Dyspnea    Educated the Family to role of physical therapy, plan of care, goals of therapy and safety with mobility and ADLs, discharge instructions, home safety with verbalized understanding  and demonstrated understanding.    Patient left in bed with alarm and all other medical equipment in place and call bell and all personal items/needs within reach.  RN notified of session outcome.       Assessment:  Rick [name] is a 89 y.o. male admitted 08/24/2022.  PT Assessment  Assessment: Decreased LE strength;Decreased safety/judgement during functional mobility;Decreased endurance/activity tolerance;Decreased functional mobility;Gait impairment;Decreased balance  Prognosis: Good;With continued PT status post acute discharge  Progress: Improving as expected      Treatment:  Patient received supine in bed and agreed to participate in PT session. Patient presents as A&Ox2, oriented to person & place but disoriented to time and situation. Educated pt on PT role in acute setting and progressive mobility protocol. Prior level of function information obtained from son over the phone as pt is a poor historian 2/2 dementia. Increased time/effort was required for pt to transition from supine> sit requiring modA for  trunk/LE sequencing. Once at EOB, pt requiring minA initially to maintain sitting balance but was able to progress to SBA when cued to use b/l UE to maintain balance. Pt then performed STS transfer from EOB w/ RW support at [facility]. While standing pt demonstrated posterior LOB that was corrected with physical assistance, minA. Trailed weight-shifting in standing/standing marches w/ RW support, however pt was unable to maintain balance without assistance. Pt was unsafe to ambulate 2/2 poor standing balance. Pt then returned to sitting position. Once pt was sitting, pt began to urinate uncontrollably 2/2 incontinence. PT cleaned up floor area. Patient was then reattached to catheter line and returned to supine position with minA for trunk//LE sequencing. Pt was then scooted to [facility] w/ dependent assist x2 person assist from RN&PT.  PT recommendation is for home with home PT/OT which was discussed with son over the phone who was agreeable. Patient will continue to benefit from skilled PT in acute setting to maximize independence with goals listed below and return to baseline function.       Plan:  Treatment/Interventions: Exercise, Stair training, Gait training, Neuromuscular re-education, Functional transfer training, LE strengthening/ROM, Endurance training, Patient/family training, Bed mobility  PT Frequency: 1-2x/wk  Risks/Benefits/POC Discussed with Pt/Family: With family, Patient unable to participate in goal setting    PMP Activity: Step 4 -  Dangle at Bedside  Distance Walked (ft) (Step 6,7): 0 Feet      Goals:  Goals  Time for Goal Acheivement: By time of discharge  Pt Will Go Supine To Sit: with contact guard assist, to maximize functional mobility and independence  Pt Will Perform Sit to Stand: with contact guard assist, to maximize functional mobility and independence  Pt Will Transfer Bed/Chair: with rolling walker, with minimal assist, to maximize functional mobility and independence  Pt Will Ambulate: 51-100  feet, with rolling walker, with minimal assist, to maximize functional mobility and independence    Rick [name] PT,DPT  08/25/2022  3:33 PM    Physical Therapist  Physical Medicine and Rehabilitation   [phone]        [facility]  Patient: Rick [name] MRN#: [id number]  Unit: [facility] UN[address] Bed: A2121/A2121-A

## 2022-08-25 NOTE — Plan of Care (Signed)
Shift Note: Patient remained stable overnight on Unit 21. Vital signs stable on room air with no complaints of pain. Disoriented to time and situation at baseline so frequent reorientation provided throughout the night. IV fluids running continuously per order and tele-sitter at bedside for safety. Nursing will continue to monitor patient.     4 eyes in 4 hours pressure injury assessment note:      RN or CNA Completed with: Lennox Laity, Clinical Tech and Designer, television/film set           Bony Prominences: Check appropriate box below     If wound is present add wound to LDA avatar      Occiput:   [x]  WNL   []  Wound present    Face:                     [x]  WNL    []  Wound present    Ears:      [x]  WNL   []  Wound present    Spine:    [x]  WNL   []  Wound present    Shoulders:    [x]  WNL    []  Wound present    Elbows:    [x]  WNL    []  Wound present    Sacrum/coccyx:   [x]  WNL    []  Wound present    Ischial Tuberosity:  [x]  WNL  []  Wound present    Trochanter/Hip:       [x]  WNL  []  Wound present    Knees:     [x]  WNL   Old, healed surgical scars on bilateral knees, but no open wounds present or redness.  []  Wound present    Ankles:     [x]  WNL  []  Wound present    Heels:    [x]  WNL  []  Wound present      Other pressure areas:      Wound Location: No wounds on admission.       Device related: []  Device:       Consult WOCN for guidance & staging of pressure injuries.         Problem: Compromised Moisture  Goal: Moisture level Interventions  Outcome: Progressing     Problem: Moderate/High Fall Risk Score >5  Goal: Patient will remain free of falls  Outcome: Progressing  Flowsheets (Taken 08/25/2022 0000)  High (Greater than 13):   HIGH-Consider use of low bed   HIGH-Apply yellow "Fall Risk" arm band   HIGH-Bed alarm on at all times while patient in bed   MOD-Remain with patient during toileting  Note: Telesitter at bedside for safety, yellow gown and non-skid socks in use, and hourly rounding completed overnight.      Problem:  Day of Admission - Heart Failure  Goal: Heart Failure Admission  Outcome: Progressing  Flowsheets (Taken 08/25/2022 0249)  Heart Failure Admission:   Standing Weight on admission, if unable to stand zero the bed and use the bed scale   Strict Intake/Output   Vital signs and telemetry per policy   Assess for swelling/edema and document     Problem: Everyday - Heart Failure  Goal: Stable Vital Signs and Fluid Balance  Outcome: Progressing  Flowsheets (Taken 08/25/2022 0249)  Stable Vital Signs and Fluid Balance:   Daily Standing Weights in the morning using the same scale, after using the bathroom and before breadfast.  If unable to stand, zero the bed and use the bed scale   Fluid Restriction  Assess for swelling/edema   Monitor, assess vital signs and telemetry per policy   Monitor labs and report abnormalities to physician  Note: Vital signs, telemetry, and lab values monitored.   Goal: Mobility/Activity is Maintained at Optimal Level for Patient  Outcome: Progressing  Goal: Nutritional Intake is Adequate  Outcome: Progressing  Goal: Teaching-Using CHF Warning Zones and Educational Videos  Outcome: Progressing     Problem: Hemodynamic Status: Cardiac  Goal: Stable vital signs and fluid balance  Outcome: Progressing  Flowsheets (Taken 08/25/2022 0249)  Stable vital signs and fluid balance:   Assess signs and symptoms associated with cardiac rhythm changes   Monitor lab values     Problem: Pain interferes with ability to perform ADL  Goal: Pain at adequate level as identified by patient  Outcome: Progressing  Flowsheets (Taken 08/25/2022 0249)  Pain at adequate level as identified by patient:   Identify patient comfort function goal   Offer non-pharmacological pain management interventions   Evaluate patient's satisfaction with pain management progress   Evaluate if patient comfort function goal is met

## 2022-08-26 ENCOUNTER — Inpatient Hospital Stay (HOSPITAL_COMMUNITY): Payer: Medicare Other

## 2022-08-26 DIAGNOSIS — R001 Bradycardia, unspecified: Secondary | ICD-10-CM

## 2022-08-26 DIAGNOSIS — Z515 Encounter for palliative care: Secondary | ICD-10-CM

## 2022-08-26 DIAGNOSIS — I459 Conduction disorder, unspecified: Secondary | ICD-10-CM

## 2022-08-26 DIAGNOSIS — I509 Heart failure, unspecified: Secondary | ICD-10-CM

## 2022-08-26 DIAGNOSIS — Z7189 Other specified counseling: Secondary | ICD-10-CM

## 2022-08-26 DIAGNOSIS — I5022 Chronic systolic (congestive) heart failure: Secondary | ICD-10-CM

## 2022-08-26 DIAGNOSIS — F03B0 Unspecified dementia, moderate, without behavioral disturbance, psychotic disturbance, mood disturbance, and anxiety: Secondary | ICD-10-CM

## 2022-08-26 DIAGNOSIS — N179 Acute kidney failure, unspecified: Secondary | ICD-10-CM

## 2022-08-26 DIAGNOSIS — I255 Ischemic cardiomyopathy: Secondary | ICD-10-CM

## 2022-08-26 LAB — ECHO ADULT TTE COMPLETE
AV Area (Cont Eq VTI): 1.01
AV Area (Cont Eq VTI): 1.143
AV Mean Gradient: 16
AV Mean Gradient: 16
AV Mean Gradient: 16
AV Mean Gradient: 16
AV Mean Gradient: 20
AV Peak Velocity: 2.54
AV Peak Velocity: 2.76
BP Mod LV Ejection Fraction: 41
IVS Diastolic Thickness (2D): 1.033
IVS Diastolic Thickness (2D): 1.11
LA Dimension (2D): 5.1
LA Dimension (2D): 5.2
LA Volume Index (BP A-L): 54
LVID diastole (2D): 6.15
LVID diastole (2D): 6.376
LVID systole (2D): 4.5
MV Area (PHT): 1.94
MV E/A: 0.9
MV E/A: 0.928
MV E/e' (Average): 9.064
MV Mean Gradient: 2
Prox Ascending Aorta Diameter: 3.9
Prox Ascending Aorta Diameter: 4
Pulmonary Valve Findings: NORMAL
RV Basal Diastolic Dimension: 5.21
RV Function: NORMAL
RV Systolic Pressure: 48.704
RV Systolic Pressure: 49
TAPSE: 1.8
Tricuspid Valve Findings: NORMAL

## 2022-08-26 LAB — LAB USE ONLY - CBC WITH DIFFERENTIAL
Absolute Basophils: 0.01 10*3/uL (ref 0.00–0.08)
Absolute Eosinophils: 0.1 10*3/uL (ref 0.00–0.44)
Absolute Immature Granulocytes: 0.01 10*3/uL (ref 0.00–0.07)
Absolute Lymphocytes: 1.16 10*3/uL (ref 0.42–3.22)
Absolute Monocytes: 0.4 10*3/uL (ref 0.21–0.85)
Absolute Neutrophils: 1.38 10*3/uL (ref 1.10–6.33)
Absolute nRBC: 0 10*3/uL (ref <=0.00)
Basophils %: 0.3 %
Eosinophils %: 3.3 %
Hematocrit: 36 % — ABNORMAL LOW (ref 37.6–49.6)
Hemoglobin: 11.8 g/dL — ABNORMAL LOW (ref 12.5–17.1)
Immature Granulocytes %: 0.3 %
Lymphocytes %: 37.9 %
MCH: 33.7 pg — ABNORMAL HIGH (ref 25.1–33.5)
MCHC: 32.8 g/dL (ref 31.5–35.8)
MCV: 102.9 fL — ABNORMAL HIGH (ref 78.0–96.0)
MPV: 11.3 fL (ref 8.9–12.5)
Monocytes %: 13.1 %
Neutrophils %: 45.1 %
Platelet Count: 73 10*3/uL — ABNORMAL LOW (ref 142–346)
Preliminary Absolute Neutrophil Count: 1.38 10*3/uL (ref 1.10–6.33)
RBC: 3.5 10*6/uL — ABNORMAL LOW (ref 4.20–5.90)
RDW: 12 % (ref 11–15)
WBC: 3.06 10*3/uL — ABNORMAL LOW (ref 3.10–9.50)
nRBC %: 0 /100 WBC (ref <=0.0)

## 2022-08-26 LAB — COMPREHENSIVE METABOLIC PANEL
ALT: 9 U/L (ref 0–55)
AST (SGOT): 26 U/L (ref 5–41)
Albumin/Globulin Ratio: 0.8 — ABNORMAL LOW (ref 0.9–2.2)
Albumin: 3.1 g/dL — ABNORMAL LOW (ref 3.5–5.0)
Alkaline Phosphatase: 57 U/L (ref 37–117)
Anion Gap: 4 — ABNORMAL LOW (ref 5.0–15.0)
BUN: 25 mg/dL (ref 9–28)
Bilirubin, Total: 0.7 mg/dL (ref 0.2–1.2)
CO2: 22 mEq/L (ref 17–29)
Calcium: 9.3 mg/dL (ref 7.9–10.2)
Chloride: 115 mEq/L — ABNORMAL HIGH (ref 99–111)
Creatinine: 1.3 mg/dL (ref 0.5–1.5)
GFR: 50.6 mL/min/{1.73_m2} — ABNORMAL LOW (ref >=60.0)
Globulin: 3.8 g/dL — ABNORMAL HIGH (ref 2.0–3.6)
Glucose: 85 mg/dL (ref 70–100)
Potassium: 4.9 mEq/L (ref 3.5–5.3)
Protein, Total: 6.9 g/dL (ref 6.0–8.3)
Sodium: 141 mEq/L (ref 135–145)

## 2022-08-26 LAB — PHOSPHORUS: Phosphorus: 3.1 mg/dL (ref 2.3–4.7)

## 2022-08-26 LAB — MAGNESIUM: Magnesium: 1.9 mg/dL (ref 1.6–2.6)

## 2022-08-26 LAB — THYROID STIMULATING HORMONE (TSH) WITH REFLEX TO FREE T4: TSH: 2.5 u[IU]/mL (ref 0.35–4.94)

## 2022-08-26 NOTE — Progress Notes (Signed)
CARDIOLOGY HOSPITAL NOTE  Gilford Raid, NP   [facility]  Office phone:  [phone]     [facility] phone [phone]  [facility] phone [phone]    Date:  08/26/2022  12:10 PM    Patient:  Rick [name].  DOB: Jul 03, 1924.  89 y.o.  male , MRN [id number]   Attending:  Gayleen Orem, MD   Admission date:  08/24/2022     ASSESSMENT & PLAN:     89 y.o. male with prior medical history for HFrEF (LVEF 30%), coronary artery disease, TAVR, hypertension, hyperlipidemia, monoclonal gammopathy and dementia who was sent to ED on 08/24/22 with hypotension (90/50).  He was hyperkalemic with AKI on presentation.  His heart rates was down to 30s overnight with 3-second pause for which a cardiac consultation was requested.     Primary diagnosis: Acute kidney injury    Bradycardia and intermittent Mobitz I  -Bradycardia down to 29  -multiple pauses ~2.0, longest pause of 2.9 seconds  -intermittent Mobitz II  -EP consultation yesterday: do not believe SA/AV block will be life threatening. EP discussed with son who appreciated quality of life at his advanced age and did not opt for ICD/PPM.     PVCs/NSVT  -high volume  -unable to tolerate medications due to SA and AV blocks (see above)  -4 brief runs of NSVT overnight, longest episode was 7 beats    Acute kidney injury  -likely in setting of dehydration?  -BUN 56 with Cr 2.5 on arrival ->BUN Rick Cr 1.9 -> BUN 25 Cr 1.3   -NT-proBNP  548    HFrEF  -LVEF 30%   -euvolemic fluid   -Daily weight 147-155 lbs.   -CXR without any acute process    Cardiomyopathy  -suspect mixed ischemic and non-ischemic, no further workup per family due to advanced age, medically managing  -Guideline Directed Medical Therapy   Beta Blocker: avoiding due to bradycardia    ACEi/ARB/ARNI: 1/2 tablet Entresto bid   WNU:UVOZDGUYQ 12.5 mg daily (at home, currently on hold due to hyperkalemia and AKI on presentation at hospital)   SGLT2i: Jardiance 10 mg     Diuretic:    Thrombocytopenia  -Platelets 73    Coronary artery disease  -rosuvastatin 5 mg daily     Goals of care  -Would appreciate palliative consultation to clarify goals of care prior to discharge in light of NSVT, pauses and inability to tolerate pharmaceutical intervention and choice of non-invasive support due to advanced age and co-morbidities.     _____________________________    Physician Statement:    I personally evaluated Rick [name] and performed a substantive portion of this visit by personally conducting the medical-decision making in its entirety.  The patient was also seen by APP who performed the history and physical exam portion of the visit. I reviewed and updated the documented findings and plan of care accordingly.     Rick [name] with history of CAD s/p CABG, s/p TAVR, HFrEF with LVEF of 25%, HLD, moderate pulm HTN, memory impairment admitted for evaluation of hypotension. Not on AV nodal blocking agents.  Telemetry shows episodes of marked sinus bradycardia with HR at times below 40, Mobitz type I, 2nd AVB (Wenckebach) as well as well as ~3-sec pauses. Seen by EP who, after discussions with son, recommended no PPM.  Given for potential deterioration of patient's bradyarrhythmia and no plan for PPM, will involve palliative medicine to delineate goals of care with  family and team members.  Discussed with hospitalist and palliative medicine attendings.    _____________________________    [name], MD, MPH, [facility]  Cardiology - [facility]    Office: [phone]  [facility]: [phone]    [facility]: [phone]  [facility]: [phone]  _____________________________  08/26/2022   Interval history:     He denies any chest discomfort, palpitations, edema, shortness of breath, orthopnea, PND, bleeding, presyncope or syncope.      PAST MEDICAL HISTORY:     Past Medical History:   Diagnosis Date    Arrhythmia 2021     RBBB    Atrial enlargement, bilateral Echo result 04/04/19    Bilateral cataracts     Son reports pt has the ability to lay flat for Rick+min without CP or SOB as reported on 05/14/22 for DOS  06/04/22    CAD (coronary artery disease) 05/28/19 Cath results.    Severe 3V CAD.  Patent LIMA to LAD, patent SVG to OM 1 and OM 2 of circumflex CA, & patent SVG to RCA.    Chronic combined systolic and diastolic heart failure 310/21 OV Dr. Penny Pia    EF=30%    Dementia 04/2014    Difficulty walking 06/2016    Post surgery of lt knee, rt femur/ uses walker and wheelchair for long distances prn    Disorder of prostate     Dyslipidemia 310/21 OV Dr. Penny Pia    Dyspnea on exertion 02/2018    occas per son report on 05/14/22, doing better    Hearing loss     mild . does not like to wear/ bilat    HTN (hypertension) 310/21 OV Dr. Penny Pia    Son reports unaware of htn dx as reported on 3/114/24    Low back pain     muscular    Primary cardiomyopathy 11/2017    Urinary incontinence     occas  2-3 monthly at qhs or in am    VTE (venous thromboembolism) Feb 2023    In lungs, combined with bronchitis     Past Surgical History:   Procedure Laterality Date    COLONOSCOPY, DIAGNOSTIC (SCREENING)      CORONARY ARTERY BYPASS GRAFT  2003     approx year: LIMA to LAD,  SVG to OM 1 & OM 2 of circumflex CA, & SVG to RCA    FRACTURE SURGERY Right 06/2016    femur    HERNIORRHAPHY, INGUINAL Right 04/29/2021    Procedure: OPEN RIGHT HERNIORRHAPHY, INGUINAL WITH MESH;  Surgeon: Towanda Octave, MD;  Location: [facility];  Service: [name];  Laterality: Right;    PERCUTANEOUS VALVULOPLASTY - AORTIC N/A 07/12/2019    Procedure: TAVR #2;  Surgeon: Arlyss Queen, MD PhD;  Location: [facility];  Service: Cardiovascular;  Laterality: N/A;  23 HR OB    REPLACEMENT, AORTIC VALVE , EDWARDS SAPIEN N/A 07/12/2019    Procedure: REPLACEMENT, AORTIC VALVE , EDWARDS SAPIEN -- TAVR Valve:   S3 29mm -- Access: RTF -- IC: Dr. Penny Pia;  Surgeon: Christene Lye, MD;  Location: [facility] OR;  Service: Cardiothoracic;  Laterality: N/A;    RIGHT & LEFT HEART CATH POSSIBLE PCI N/A 05/26/2019    Procedure: RIGHT & LEFT HEART CATH POSSIBLE PCI;  Surgeon: Arlyss Queen, MD PhD;  Location: [facility];  Service: Cardiovascular;  Laterality: N/A;  [id number] scheduled with Maira in dr's office. OP. Efraim Kaufmann  TOTAL KNEE ARTHROPLASTY Left 2017    TOTAL KNEE ARTHROPLASTY Right 2005       SOCIAL HISTORY:     Social History     Tobacco Use    Smoking status: Never    Smokeless tobacco: Never   Substance Use Topics    Alcohol use: Never       FAMILY HISTORY:   family history includes No known problems in his brother, father, mother, and sister.    ALLERGIES:   No Known Allergies     MEDICATIONS:    INFUSION MEDS:      sodium chloride 75 mL/hr at 08/26/22 0239      SCHEDULED MEDS:     Current Facility-Administered Medications   Medication Dose Route Frequency    docusate sodium  100 mg Oral BID    empagliflozin  10 mg Oral Daily    heparin (porcine)  5,000 Units Subcutaneous Q12H SCH    memantine  5 mg Oral BID    rosuvastatin  5 mg Oral Daily    sacubitril-valsartan  0.5 tablet Oral BID    tamsulosin  0.4 mg Oral QPM      PRN MEDS:   acetaminophen, benzocaine-menthol, benzonatate, carboxymethylcellulose sodium, dextrose **OR** dextrose **OR** dextrose **OR** glucagon (rDNA), melatonin, naloxone, saline     DIAGNOSTICS:   Telemetry: sinus bradycardia into 30s and 40s with brief NSVT runs and pauses up to 3 seconds    EKG 08/24/22 14:23: sinus bradycardia with 2nd degree Mobitz I, RBBB and Left anterior fascicular block    EKG 08/24/22 13:01: sinus bradycardia with frequent PVCs, left axis deviation and RBBB    Echocardiogram 04/09/21:   * The left ventricular cavity size is dilated.  Left ventricular wall  thickness is normal.  Left ventricular systolic function is severely decreased  with an estimated ejection fraction of 25-30%.    * The right ventricular cavity size is upper limits  of normal in size.  Mildly decreased right ventricular systolic function.    * Severe bi-atrial dilation.    * There is a 29mm Sapien 3 TAVR valve in aortic position that is normally  functioning.    * There is severe tricuspid regurgitation.  Moderate pulmonary hypertension  with estimated right ventricular systolic pressure of 54 mmHg.    * There is moderate pulmonic valve regurgitation.    * There is mild to moderate mitral regurgitation.    * Elevated right atrial pressure.    * Compared to the prior study dated 08/25/2019 (personally reviewed),  tricuspid regurgitation increased. There are frequent PVC's throughout the  length of this study. Otherwise, no significant changes.    TTE(09/05/2019):  Mildly dilated LV, LVEF 30%, mildly increased LVH,LVOT VTI is low consistent with low stroke volume (58mL) and cardiac output (3.3L/min).  Stroke volume index 84mL/m2, mildly dilated RV cavity with decreased systolic function, RV FAC 11.2%, RV S' 6.6cm/s.  TAPSE 16mm. known 29mm Sapien 3 TAVR valve in aortic position. It appears well seated. DVI 0.3 and EOA 1.3cm2. These values are derived from a low stroke volume (low LVOT VTI) and may reflect an underestimation of valve area due to low output state.  Mild MR, mild-moderate TR, moderate pHTN, estimated RVSP 46 mmHg, dilated ascending aorta compared to previous study 07/13/2019, TAVR hemodynamics have changed, but may be underestimated due to low output state.     Severe AS s/p TAVR 07/12/2019     CAD s/p  CABG in 2003.   Cardiac cath  06/2019 showed patent LIMA to LAD, patent SVG to OM1, patent SVG to OM2, and patent SVG to RCA.     LABS:     Recent Labs   Lab 08/26/22  0519 08/25/22  0345 08/24/22  1447   WBC 3.06* 3.51 3.77   Hemoglobin 11.8* 11.3* 12.5   Hematocrit 36.0* 34.9* 38.2   Platelet Count 73* 72* 78*     Recent Labs   Lab 08/26/22  0519 08/25/22  0345 08/24/22  1447   Sodium 141 143 140   Potassium 4.9 4.3 5.4*   Chloride 115* 114* 109   CO2 22 25 26    BUN 25  Rick* 56*   Creatinine 1.3 1.9* 2.5*   GFR 50.6* 32.1* 23.1*   Glucose 85 113* 79   Calcium 9.3 9.3 10.2                FLUID BALANCE AND WEIGHT:                                           Last filed weight: Weight: 69.9 kg (154 lb 1.6 oz)  Weight on Admission: Weight: 70 kg (154 lb 5.2 oz)  Net IO Since Admission: -1,473 mL [08/26/22 1210]     Intake/Output Summary (Last 24 hours) at 08/26/2022 0700  Last data filed at 08/26/2022 0600  Gross per 24 hour   Intake 1551 ml   Output 3000 ml   Net -1449 ml   Weight change: -0.1 kg (-3.5 oz)    Last 3 Weights for the past 72 hrs (Last 3 readings):   Weight   08/26/22 0500 69.9 kg (154 lb 1.6 oz)   08/25/22 0412 70 kg (154 lb 5.2 oz)   08/24/22 2207 70 kg (154 lb 5.2 oz)        PHYSICAL EXAM:                                                                 BP 144/71   Pulse (!) 37   Temp 97.3 F (36.3 C) (Oral)   Resp 15   Ht 1.727 m (5\' 8" )   Wt 69.9 kg (154 lb 1.6 oz)   SpO2 99%   BMI 23.43 kg/m     [name] Appearance:  Breathing comfortable, no acute distress  Head:  normocephalic  Eyes:  EOM's intact, nonicteric sclera  Neck:  No carotid bruit or jugular venous distension  Lungs:  Clear to auscultation upper lobes, mild rales in middle lobes and diminished in bases bilaterally, no wheezes, rhonchi or rales, good respiratory effort, symmetric chest expansion  Cardiac:  RRR, normal S1, S2, no S3, no S4, no rub, Left upper and lower grade 2/6 systolic murmur,left lower border click, PMI holosystolic grade 3/4 murmur   Abdomen:  Soft, non-tender, no rebound or guarding, non-distended, positive bowel sounds  Extremities:  No cyanosis or clubbing. No arthritis or deformities. No LE edema  Vascular: 2+ radial and distal pulses bilaterally  Neurologic:  Alert and oriented x2, mood and affect normal

## 2022-08-26 NOTE — Progress Notes (Signed)
[facility]  PROGRESS NOTE      Patient: Rick [name]  Date: 08/26/2022   LOS: 2 Days  Admission Date: 08/24/2022   MRN: [id number]  Attending: Gayleen Orem, MD  When on service as the attending, please contact me on Epic Secure Chat from 7 AM - 7 PM for non-urgent issues. For urgent matters use XTend page from 7 AM - 7 PM.       ASSESSMENT/PLAN     Rick [name] is a 89 y.o. male with history of dementia, monoclonal gammopathy, heart failure with reduced EF 30%, CAD, hypertension, hyperlipidemia admitted with Acute kidney failure and hypotension found to have bradycardia and 3-second pause    Interval Summary:     Patient Active Hospital Problem List:  Hypotension  By the time patient arrived blood pressure was normal  Entresto resumed with holding parameter  Spironolactone and Lasix on hold  Patient appears euvolemic    Bradycardia  3-second pause  Intermittent Mobitz type II AV block  Cardiology evaluated and recommended EP consultation  EP evaluated and recommended for now no intervention  Heart rate dropped to the low 30s  Discussed with general cardiology, given this is less likely compatible with life without pacemaker recommended palliative team consultation  CODE STATUS changed to DNR/DNI  Discussed with the son who would not like pacemaker insertion given dementia and advanced age which is understandable and appropriate  PT OT recommended home with home health  Does not want full comfort care at this point  Memantine and donepezil discontinued because of bradycardia  Will watch him for today and likely discharge him back home with home therapy tomorrow    Acute kidney injury  Likely in setting of dehydration  Creatinine back to baseline 1.9--1.3    Chronic heart failure with reduced EF  Makes it ischemic and nonischemic cardiomyopathy  EF is 30%  Chest x-ray unremarkable, proBNP  548  Currently euvolemic  Continue Entresto and Jardiance  Aldactone and Lasix on hold  Beta-blocker  on hold because of bradycardia    Dementia  Donepezil and Memantine discontinued    Chronic thrombocytopenia  Continue to monitor platelets    Coronary artery disease  Continue statin    History of PE  Patient is not on anticoagulation    Nutrition: Heart healthy diet                   Recent Labs     08/26/22  0519 08/25/22  0345 08/24/22  1447   Platelet Count 73* 72* 78*     Diagnosis: Moderate Thrombocytopenia             Recent Labs     08/26/22  0519 08/25/22  0345 08/24/22  1447   Potassium 4.9 4.3 5.4*     Diagnosis: Hyperkalemia          Recent Labs   Lab 08/26/22  0519 08/25/22  0345 08/24/22  1447   Hemoglobin 11.8* 11.3* 12.5   Hematocrit 36.0* 34.9* 38.2   MCV 102.9* 105.4* 106.1*   WBC 3.06* 3.51 3.77   Platelet Count 73* 72* 78*         Anemia Diagnosis: Chronic: Anemia of Chronic Kidney Disease    Code Status: NO CPR - SUPPORT OK    Dispo: 24 hours    Family Contact: Son over the phone    DVT Prophylaxis:   Current Facility-Administered Medications (Includes Only Anticoagulants, Misc. Hematological)   Medication  Dose Route Last Admin    heparin (porcine) injection 5,000 Units  5,000 Units Subcutaneous 5,000 Units at 08/26/22 1051          CHART  REVIEW & DISCUSSION     The following chart items were reviewed as of 5:57 PM on 08/26/22:  [x]  Lab Results [x]  Imaging Results   [x]  Problem List  [x]  Current Orders [x]  Current Medications  [x]  Allergies  []  Code Status []  Previous Notes   []  SDoH    The management and plan of care for this patient was discussed with the following specialty consultants:  [x]  Cardiology  []  Gastroenterology                 []  Infectious Disease  []  Pulmonology []  Neurology                []  Nephrology  []  Neurosurgery []  Orthopedic Surgery  []  Heme/Onc  []  General Surgery []  Psychiatry                                   []  Palliative    SUBJECTIVE     Rick [name] evaluated at the bedside alert oriented x 2  Denies any chest pain, lightheadedness, nausea or  vomiting      MEDICATIONS     Current Facility-Administered Medications   Medication Dose Route Frequency    docusate sodium  100 mg Oral BID    empagliflozin  10 mg Oral Daily    heparin (porcine)  5,000 Units Subcutaneous Q12H SCH    memantine  5 mg Oral BID    rosuvastatin  5 mg Oral Daily    sacubitril-valsartan  0.5 tablet Oral BID    tamsulosin  0.4 mg Oral QPM       PHYSICAL EXAM     Vitals:    08/26/22 1540   BP: 120/69   Pulse: (!) 54   Resp:    Temp:    SpO2: 100%       Temperature: Temp  Min: 97.3 F (36.3 C)  Max: 98.6 F (37 C)  Pulse: Pulse  Min: 37  Max: 54  Respiratory: Resp  Min: 15  Max: 20  Non-Invasive BP: BP  Min: 120/69  Max: 174/69  Pulse Oximetry SpO2  Min: 96 %  Max: 100 %    Intake and Output Summary (Last 24 hours) at Date Time    Intake/Output Summary (Last 24 hours) at 08/26/2022 1757  Last data filed at 08/26/2022 1700  Gross per 24 hour   Intake 1421 ml   Output 1400 ml   Net 21 ml         GEN APPEARANCE: Normal;  A&OX2  HEENT: PERLA; EOMI; Conjunctiva Clear  NECK: Supple; No bruits  CVS: RRR, S1, S2; No M/G/R  LUNGS: CTAB; No Wheezes; No Rhonchi: No rales  ABD: Soft; No TTP; + Normoactive BS  EXT: No edema; Pulses 2+ and intact  NEURO: CN 2-12 intact; No Focal neurological deficits  MENTAL STATUS: Communicative and pleasant    LABS     Recent Labs   Lab 08/26/22  0519 08/25/22  0345 08/24/22  1447   WBC 3.06* 3.51 3.77   RBC 3.50* 3.31* 3.60*   Hemoglobin 11.8* 11.3* 12.5   Hematocrit 36.0* 34.9* 38.2   MCV 102.9* 105.4* 106.1*   Platelet Count 73* 72* 78*  Recent Labs   Lab 08/26/22  0519 08/25/22  0345 08/24/22  1447   Sodium 141 143 140   Potassium 4.9 4.3 5.4*   Chloride 115* 114* 109   CO2 22 25 26    BUN 25 45* 56*   Creatinine 1.3 1.9* 2.5*   Glucose 85 113* 79   Calcium 9.3 9.3 10.2   Magnesium 1.9  --   --        Recent Labs   Lab 08/26/22  0519 08/24/22  1447   ALT 9 11   AST (SGOT) 26 18   Bilirubin, Total 0.7 0.6   Albumin 3.1* 3.9   Alkaline Phosphatase 57 68                    Microbiology Results (last 15 days)       Procedure Component Value Units Date/Time    COVID-19 and Influenza (Liat) (symptomatic) [[id number]]  (Normal) Collected: 08/24/22 1448    Order Status: Completed Specimen: Swab from Anterior Nares Updated: 08/24/22 1519     SARS-CoV-2 (COVID-19) RNA Not Detected     Influenza A RNA Not Detected     Influenza B RNA Not Detected    Narrative:      A result of "Detected" indicates POSITIVE for the presence of viral RNA  A result of "Not Detected" indicates NEGATIVE for the presence of viral RNA    Test performed using the Roche cobas Liat SARS-CoV-2 & Influenza A/B assay. This is a multiplex real-time RT-PCR assay for the detection of SARS-CoV-2, influenza A, and influenza B virus RNA. Viral nucleic acids may persist in vivo, independent of viability. Detection of viral nucleic acid does not imply the presence of infectious virus, or that virus nucleic acid is the cause of clinical symptoms. Negative results do not preclude SARS-CoV-2, influenza A, and/or influenza B infection and should not be used as the sole basis for diagnosis, treatment or other patient management decisions. Invalid results may be due to inhibiting substances in the specimen and recollection should occur.              RADIOLOGY     Chest AP Portable    Result Date: 08/24/2022   No active disease. Postsurgical changes. Cardiomegaly. Kinnie Feil, MD 08/24/2022 3:19 PM   Echo Results       None          Results for orders placed or performed during the hospital encounter of 06/18/22   CT Head WO Contrast    Narrative    HISTORY: Unwitnessed fall. Posttraumatic headache and weakness. Patient is  on anticoagulation.    COMPARISON: Outside Brain MRI on 09/03/2020.    TECHNIQUE: CT of the head performed without intravenous contrast. The  following dose reduction techniques were utilized: automated exposure  control and/or adjustment of the mA and/or KV according to patient size,  and the use of an  iterative reconstruction technique.    CONTRAST: None.    FINDINGS:  There is no acute hemorrhage. The ventricles and subarachnoid spaces are  proportionately enlarged reflecting volume loss.  The gray-white matter  differentiation is preserved. There is a small chronic lacunar infarct in  right cerebellum. There is moderate periventricular hypodensity most  consistent with chronic small vessel ischemic change. There is no mass  effect or midline shift. The basal cisterns are patent. Intracranial  vascular calcifications are present.    No suspicious or aggressive osseous lesion is identified. The imaged  paranasal sinuses and mastoids are clear.         Impression       1.No CT evidence of acute intracranial hemorrhage, herniation, or  hydrocephalus.   2.Moderate chronic small vessel ischemic changes.      Nelya Ebadirad  06/18/2022 9:37 AM       Signed,  Gayleen Orem, MD  5:57 PM 08/26/2022

## 2022-08-26 NOTE — Progress Notes (Signed)
LOS # 2      Summary of Discharge Plan: home w/ [facility] services      Identified Possible Discharge Barriers:medical condition      CM Interventions and Outcome:MDR anticipated d/c today. [facility] referral sent for [facility] PT/OT/SN/HHA and DME shower chair. Son to transport upon d/c.       Discussed above Discharge Plan with (patient, family, Care Team, others): yes      Case Management will continue to follow for [place] needs that may arise.    Sherryll Burger BSN, RN CM  RN Case Manager I  [facility]  [phone]

## 2022-08-26 NOTE — Consults (Addendum)
[facility] Palliative Medicine & Comprehensive Care Consult   Service Spectralink: [phone]  Mon-Fri 9a-4p        Palliative Care Consult   Date Time: 08/26/22 4:17 PM   Patient Name: Rick [name]   Location: [id number]-[id number]   Attending Physician: Gayleen Orem, MD   Primary Care Physician: Erasmo Score, MD   Consulting Provider: Hermelinda Dellen, MD   Consulting Service: Palliative Medicine and Comprehensive Care  Consult request from Gayleen Orem, MD to see patient regarding:   Reason for Referral: End of life     Palliative Diagnosis Category: Cardiac and Neurology (CVA/Dementia...)  Patient Type: New     Assessment & Plan  Assessment & Plan   Impression      Rick [name] is a 89 y.o. male with significant PMH of moderate dementia, HFrEF, and cardiomyopathy who presents for decreased blood pressure and low heart rate, found to have bradycardia with Mobitz I heart blocks, and frequent PVCs. Palliative Care consulted in the context of goals of care, advance care planning.     #Moderate dementia  #Bradycardia with Mobitz I heart blocks  #Frequent PVCs  #HFrEF EF 30%  #Mixed ischemic and non-ischemic cardiomyopathy  #Advance care planning/goals of care  #Palliative care by specialist    Estimated Prognosis: Days to weeks vs weeks to months.  Thank you for allowing [place] to participate in the care of this patient.    Recommendations   1. Goals of Care:      #Capacity: Patient without decisional capacity to make his own decisions surrounding restorative care vs comfort-focused care     #Medical decision maker(s): ACP documents noted. Son/MPOA #1 Kahleb Fecteau II. MPOA #2 daughter-in-law Stanton Kidney.      #Goals discussed:  - Consult noted for EOL, GOC  - Discussed case with son/MPOA #1 Attilio Willner II. Extensive discussion held surrounding goals of care. Discussed cardiology recommendations and recent discussions surrounding focus on quality of life and foregoing any interventions such as AICD and PPM.  Discussed difficulty fully treating patient's HFrEF with GDMT given issues with hypotension and AKI.   - Discussed the trajectory of dementia and its progressive nature leading to repeat hospitalizations related to poor oral intake, dehydration, renal failure and repeat infections such as aspiration PNAs, DTIs, and UTIs.   - Extensive discussion surrounding restorative care in the setting of not being a candidate for advanced cardiac therapies. Discussed this would likely include high risk for repeat hospitalizations for cardiac related issues related to hypotension and bradycardia. Discussed PT/OT for increased strength and conditioning would also be part of a restorative care pathway.   - Discussed a more comfort-focused approach to care fully focusing on quality of life, discussed hospice services and philosophy.   - Discussed CODE STATUS. Discussed patient's prior advance directives that are uploaded to the chart and express wishes for full life-sustaining measures including CPR and intubation. Recommended for DNR/DNI in the setting of the patient's known co-morbidities of dementia, HFrEF and bradycardia.   Outcomes:  GOALS: Son's goals for now are for patient to improve his strength and conditioning via PT so that he may possibly regain candidacy for cataract surgery. Son's goals are for patient's vision to improve as son says that the patient was a prior athlete and in [employer] and, thus, muscle strength and vision are extremely important goals for the patient. I explained that the patient remains a hospice candidate and hospice can always be initiated from the outpatient setting. Son  deferred hospice informational visit for now. Goals are for no further aggressive interventions with the goal for SNF/rehab vs home with PT. Son defers any comfort-focused hospice care for now and is OK with the patient's high risk for repeat hospitalizations as needed.   CODE STATUS: Son says that even though the patient's  prior advance directives show the patient expressed wishes for full life-sustaining measures, the patient had recent conversations with the son and expressed to son that the patient would not want any aggressive interventions done moving forward. Son would like to change code status to DNR/DNI. We completed DDNR form. We also completed [place] POLST form expressing wishes for DNR/selective treament/NO PEG.   - Goals are clear for restorative care for now, for SNF rehab vs home with PT, to defer any comfort care or hospice info visit for now. ACP documentation including DDNR and POLST forms done as above. Our team will sign off for now, please do re-consult as needed. Discussed with hospitalist.       # Code Status:       - change CODE STATUS to DNR/DNI       -Discussed Code status in detail with the patient's son      # Advance Care Planning:       - ACP Validation: Valid advanced care planning documents present on admission, Valid advanced care planning documents completed during admission, and Valid durable DNR/POST document completed during admission       - Medical Decision Maker: ACP documents noted. Son/MPOA #1 Rayshaun Herrero II. MPOA #2 daughter-in-law Stanton Kidney.        - ACP Document: Living will, HCPOA, and DDNR/POST      # Hospice Care:       - Goals are clear for restorative care for now, for SNF rehab vs home with PT, to defer any comfort care or hospice info visit for now.     2. Symptom Management:      # At risk for Delirium:    - Delirium Precautions:           * Frequent reorientation by staff and maintaining appropriate day-night cycles          * Maximize visibility of environmental cues such as clocks, calendars          * Encourage communication and visits with family members and friends          * Allow exposure to natural light during the day, opening curtains to window          * Encourage day time activities, OOB and keeping pt active during day if possible           * Minimize disturbances  and reduce light exposure and TV in the evening/night           * Limit non essential care such as blood draws, imaging, etc          * Group blood draws together to limit frequency of needle sticks           * Optimize nutrition, bladder/bowel regimen. Constipation, urinary retention, dehydration may worsen delirium. Rule out infection such as UTI or PNA which can predispose patients to delirium.          * Minimize medications that may cause/exacerbate delirium: opiates, anticholinergics, antihistamines, benzodiazepines, and other sedatives      # Debility    # Poor Performance Status:       - Generalized weakness       -  PT/OT as tolerated    3. Psychosocial: Family support from son     10. Spiritual: patient identifies as Baptist     5. Outcomes: Family meetings, Improved non-pain symptom therapy, Clarified goals of care, Counseled regarding hospice, Provided end of life assistance, Provided advance care planning, Provided psychosocial or spiritual support, Changed CPR status, Completed durable DNR, Linked to palliative longitudinal support, ACP counseling assistance, and POST completion      PC Team follow-up plans: PC to sign off       Discharge Disposition: TBD      Outpatient Follow Up Recommended: Yes Due to multiple co morbidities and limited functional status    Discussed case with: hospitalist    80 minutes.  Total time today in care of patient including chart review, evaluation, management, counseling & coordination of care with recommendations above >50% of time on floor.       Hermelinda Dellen, MD  Palliative Medicine & Comprehensive Care  [facility]  Service Spectralink: [phone]  Mon-Fri 9a-4p  Palliative Care Office: [phone]  Mon-Fri 9a-4p     History of Presenting Illness   Medical Records Reviewed: [place] Records via Epic (current and past)    Suheyb Khadeem Rockett is a 89 y.o. male with significant PMH of moderate dementia, HFrEF, and cardiomyopathy who presents for decreased  blood pressure and low heart rate, found to have bradycardia with Mobitz I heart blocks, and frequent PVCs. Palliative Care consulted in the context of goals of care, advance care planning.     Patient seen and examined. He answers some simple questions and follows commands. He cannot make his own medical decisions.     Family meeting held with son. Please see details of family meeting above. Son would like to change CODE STATUS to DNR/DNI, goals remain for PT/rehab.      Goals of Care   CURRENT CPR Status: Prior       Advanced Care Planning:      Decisional Capacity: no      Advance Directives have been completed in the past: yes      Advance Directives are available in chart: yes      Discussed this admission: yes      ACP note completed: no       Date:      Advance Care Planning Discussion:   Change code to DNR/DNI  Goals remain for restorative care       Palliative Functional and Symptom Assessment     Palliative Performance Scale: 40% - Mainly in bed, unable to do any work, extensive disease, mainly assistance with self-care, normal or reduced intake, full LOC, drowsy or confusion    FAST Score (Dementia Patients): 6E - Fecal incontinence occasionally or more frequently over the past few weeks.         Review of Systems   [x]  Unable to obtain secondary to medical condition     Past Medical, Surgical and Family History   Past Medical History:  Past Medical History:   Diagnosis Date    Arrhythmia 2021    RBBB    Atrial enlargement, bilateral Echo result 04/04/19    Bilateral cataracts     Son reports pt has the ability to lay flat for 45+min without CP or SOB as reported on 05/14/22 for DOS  06/04/22    CAD (coronary artery disease) 05/28/19 Cath results.    Severe 3V CAD.  Patent LIMA to LAD, patent SVG to OM 1 and  OM 2 of circumflex CA, & patent SVG to RCA.    Chronic combined systolic and diastolic heart failure 310/21 OV Dr. Penny Pia    EF=30%    Dementia 04/2014    Difficulty walking 06/2016    Post surgery of lt  knee, rt femur/ uses walker and wheelchair for long distances prn    Disorder of prostate     Dyslipidemia 310/21 OV Dr. Penny Pia    Dyspnea on exertion 02/2018    occas per son report on 05/14/22, doing better    Hearing loss     mild . does not like to wear/ bilat    HTN (hypertension) 310/21 OV Dr. Penny Pia    Son reports unaware of htn dx as reported on 3/114/24    Low back pain     muscular    Primary cardiomyopathy 11/2017    Urinary incontinence     occas  2-3 monthly at qhs or in am    VTE (venous thromboembolism) Feb 2023    In lungs, combined with bronchitis        Past Surgical History:  Past Surgical History:   Procedure Laterality Date    COLONOSCOPY, DIAGNOSTIC (SCREENING)      CORONARY ARTERY BYPASS GRAFT  2003     approx year: LIMA to LAD,  SVG to OM 1 & OM 2 of circumflex CA, & SVG to RCA    FRACTURE SURGERY Right 06/2016    femur    HERNIORRHAPHY, INGUINAL Right 04/29/2021    Procedure: OPEN RIGHT HERNIORRHAPHY, INGUINAL WITH MESH;  Surgeon: Towanda Octave, MD;  Location: [facility];  Service: General;  Laterality: Right;    PERCUTANEOUS VALVULOPLASTY - AORTIC N/A 07/12/2019    Procedure: TAVR #2;  Surgeon: Arlyss Queen, MD PhD;  Location: [facility];  Service: Cardiovascular;  Laterality: N/A;  23 HR OB    REPLACEMENT, AORTIC VALVE , EDWARDS SAPIEN N/A 07/12/2019    Procedure: REPLACEMENT, AORTIC VALVE , EDWARDS SAPIEN -- TAVR Valve:   S3 29mm -- Access: RTF -- IC: Dr. Penny Pia;  Surgeon: Christene Lye, MD;  Location: [facility] OR;  Service: Cardiothoracic;  Laterality: N/A;    RIGHT & LEFT HEART CATH POSSIBLE PCI N/A 05/26/2019    Procedure: RIGHT & LEFT HEART CATH POSSIBLE PCI;  Surgeon: Arlyss Queen, MD PhD;  Location: [facility];  Service: Cardiovascular;  Laterality: N/A;  [id number] scheduled with Maira in dr's office. OP. kr    TOTAL KNEE ARTHROPLASTY Left 2017    TOTAL KNEE ARTHROPLASTY Right 2005        Past Family History:  Family History   Problem Relation Age of  Onset    No known problems Mother     No known problems Father     No known problems Sister     No known problems Brother         I have reviewed the medical records, current labs, imaging studies, progress notes and consults.    Social History   Substance Use:    reports that he has never smoked. He has never used smokeless tobacco.    reports no history of alcohol use.    reports no history of drug use.     Marital Status: widowed    Home/Family: lives with son    Occupation/Hobbies: patient was in the [employer], was an Academic librarian     Cultural Concerns:    Translator needed: [x]  NO   []   YES                                       Language: English  ID#_________    Spirituality and Importance: Baptist      Medications   Meds reviewed      Allergies   No Known Allergies    Physical Exam      Physical Exam:  General: laying supine in bed, in no acute distress  HEENT: moist mucus membranes, no scleral icterus  Heart: RRR per monitor  Lungs: no increased work of breathing   Abd: non-distended  Ext: no edema  Skin: no jaundice  Psych: calm  Neuro: awake, alert, answers simple questions, follows commands      Labs / Radiology   Lab and diagnostics: reviewed in Epic

## 2022-08-26 NOTE — Plan of Care (Signed)
No significant event overnight. Vital signs stable. Heart rate in 30-50s, irregular, telemetry already discontinued. Patient is calm and cooperative, oriented to self only. IVF infusing. Code status is now DNR, support ok. Purple arm band in place. RVM in the room. Around 0515, RN was notified by sitter in 2121B that patient is bleeding. Noted that patient's IV catheter is by the bed, and patient's gown in left side has blood in it. IV site no longer bleeding. Tip of IV catheter inspected and it is intact. Bathed patient. New IV site placed. Patient remains alert and cooperative. Safety bundle maintained. Purposeful hourly rounding completed.       Problem: Moderate/High Fall Risk Score >5  Goal: Patient will remain free of falls  Outcome: Progressing  Flowsheets (Taken 08/26/2022 1949)  High (Greater than 13):   HIGH-Consider use of low bed   HIGH-Initiate use of floor mats as appropriate   HIGH-Pharmacy to initiate evaluation and intervention per protocol   HIGH-Apply yellow "Fall Risk" arm band   HIGH-Utilize chair pad alarm for patient while in the chair   HIGH-Bed alarm on at all times while patient in bed   HIGH-Visual cue at entrance to patient's room   MOD-Include family in multidisciplinary POC discussions   MOD-Request PT/OT consult order for patients with gait/mobility impairment   MOD-Perform dangle, stand, walk (DSW) prior to mobilization   MOD-Place Fall Risk level on whiteboard in room   MOD-Utilize diversion activities   MOD-Re-orient confused patients   MOD-Use of assistive devices -Bedside Commode if appropriate   MOD-Remain with patient during toileting   MOD-Consider a move closer to Nurses Station   MOD-Use of chair-pad alarm when appropriate   LOW-Anticoagulation education for injury risk   LOW-Fall Interventions Appropriate for Low Fall Risk     Problem: Everyday - Heart Failure  Goal: Stable Vital Signs and Fluid Balance  Outcome: Progressing  Flowsheets (Taken 08/26/2022 2324)  Stable Vital  Signs and Fluid Balance:   Monitor labs and report abnormalities to physician   Strict Intake/Output   Fluid Restriction   Assess for swelling/edema   Wean oxygen as needed if appropriate   Daily Standing Weights in the morning using the same scale, after using the bathroom and before breadfast.  If unable to stand, zero the bed and use the bed scale  Goal: Mobility/Activity is Maintained at Optimal Level for Patient  Outcome: Progressing  Flowsheets (Taken 08/26/2022 2324)  Mobility/Activity is Maintained at Optimal Level for Patient:   Increase mobility as tolerated/progressive mobility protocol   Maintain SCD's as Ordered   Perform active/passive ROM   Reposition patient every 2 hours and as needed unless able to reposition self   Assess for changes in respiratory status, level of consciousness and/or development of fatigue   Consult with Physical Therapy and/or Occupational Therapy  Goal: Nutritional Intake is Adequate  Outcome: Progressing  Flowsheets (Taken 08/26/2022 2324)  Nutritional Intake is Adequate:   Cardiac diet-2 gm Sodium   Fluid Restricction if needed   Consult/Collaborate with Nutritionist   Assess appetite,anorexia and amount of meal/food tolerated   Patient and family teaching on low sodium diet   Encourage/perform oral hygiene as appropriate  Goal: Teaching-Using CHF Warning Zones and Educational Videos  Outcome: Progressing     Problem: Hemodynamic Status: Cardiac  Goal: Stable vital signs and fluid balance  Outcome: Progressing  Flowsheets (Taken 08/26/2022 2324)  Stable vital signs and fluid balance: Monitor lab values     Problem: Inadequate Tissue  Perfusion  Goal: Adequate tissue perfusion will be maintained  Outcome: Progressing  Flowsheets (Taken 08/25/2022 2205)  Adequate tissue perfusion will be maintained:   Monitor/assess lab values and report abnormal values   Monitor/assess neurovascular status (pulses, capillary refill, pain, paresthesia, paralysis, presence of edema)   Monitor/assess  for signs of VTE (edema of calf/thigh redness, pain)   Monitor for signs and symptoms of a pulmonary embolism (dyspnea, tachypnea, tachycardia, confusion)   Encourage/assist patient as needed to turn, cough, and perform deep breathing every 2 hours

## 2022-08-26 NOTE — Plan of Care (Addendum)
NURSING SHIFT NOTE     Patient: Rick [name]  Day: 2      SHIFT EVENTS   Patient remained free from falls and injury throughout shift.  Safety and fall precautions remain in place.   Purposeful rounding completed.        ASSESSMENT     Changes in assessment from patient's baseline this shift:    Neuro: No  CV: No  Pulm: No  Peripheral Vascular: No  HEENT: No  GI: No  BM during shift: No, Last BM: Last BM Date: 08/24/22  GU: No   Integ: No  MS: No  Can dangle by self, but needs maximum assist to stand with guidance.  Pain: None  Pain Interventions: None  Medications Utilized: N/A  Mobility: PMP Activity: Step 4 - Dangle at Bedside of Distance Walked (ft) (Step 6,7): 2 Feet           Lines     Patient Lines/Drains/Airways Status       Active Lines, Drains and Airways       Name Placement date Placement time Site Days    Peripheral IV 08/24/22 20 G Anterior;Distal;Left Upper Arm 08/24/22  2300  Upper Arm  1    External Urinary Catheter 08/26/22  1000  --  less than 1                         VITAL SIGNS   Visit Vitals  BP 120/69   Pulse (!) 54   Temp 97.5 F (36.4 C) (Oral)   Resp 20   Ht 1.727 m (5\' 8" )   Wt 70.7 kg (155 lb 13.8 oz)   SpO2 100%   BMI 23.70 kg/m        Intake/Output Summary (Last 24 hours) at 08/26/2022 1824  Last data filed at 08/26/2022 1700  Gross per 24 hour   Intake 1421 ml   Output 1400 ml   Net 21 ml          CARE PLAN       Problem: Compromised Moisture  Goal: Moisture level Interventions  Outcome: Progressing  Flowsheets (Taken 08/26/2022 0800 by Willette Cluster, RN)  Moisture level Interventions: Moisture wicking products, Moisture barrier cream     Problem: Compromised Activity/Mobility  Goal: Activity/Mobility Interventions  Outcome: Progressing  Flowsheets (Taken 08/26/2022 0800 by Willette Cluster, RN)  Activity/Mobility Interventions: Pad bony prominences, TAP Seated positioning system when OOB, Promote PMP, Reposition q 2 hrs / turn clock, Offload heels     Problem: Compromised  Sensory Perception  Goal: Sensory Perception Interventions  Outcome: Progressing  Flowsheets (Taken 08/26/2022 0800 by Willette Cluster, RN)  Sensory Perception Interventions: Offload heels, Pad bony prominences, Reposition q 2hrs/turn Clock, Q2 hour skin assessment under devices if present     Problem: Compromised Nutrition  Goal: Nutrition Interventions  Outcome: Progressing  Flowsheets (Taken 08/26/2022 0800 by Willette Cluster, RN)  Nutrition Interventions: Discuss nutrition at Rounds, I&Os, Document % meal eaten, Daily weights     Problem: Compromised Friction/Shear  Goal: Friction and Shear Interventions  Outcome: Progressing  Flowsheets (Taken 08/26/2022 0800 by Willette Cluster, RN)  Friction and Shear Interventions: Pad bony prominences, Off load heels, HOB 30 degrees or less unless contraindicated, Consider: TAP seated positioning, Heel foams     Problem: Moderate/High Fall Risk Score >5  Goal: Patient will remain free of falls  Outcome: Progressing  Flowsheets  Taken 08/26/2022 1823 by Arsenio Katz, RN  VH High Risk (Greater than 13): Video monitoring  Taken 08/26/2022 0800 by Willette Cluster, RN  High (Greater than 13):   HIGH-Pharmacy to initiate evaluation and intervention per protocol   HIGH-Apply yellow "Fall Risk" arm band   HIGH-Utilize chair pad alarm for patient while in the chair  Note: RVM IN ROOM     Problem: Hemodynamic Status: Cardiac  Goal: Stable vital signs and fluid balance  Outcome: Progressing  Flowsheets (Taken 08/25/2022 2205 by Melbourne Abts, RN)  Stable vital signs and fluid balance:   Monitor lab values   Assess signs and symptoms associated with cardiac rhythm changes     Problem: Inadequate Tissue Perfusion  Goal: Adequate tissue perfusion will be maintained  Outcome: Progressing  Flowsheets (Taken 08/25/2022 2205 by Melbourne Abts, RN)  Adequate tissue perfusion will be maintained:   Monitor/assess lab values and report abnormal values   Monitor/assess neurovascular status  (pulses, capillary refill, pain, paresthesia, paralysis, presence of edema)   Monitor/assess for signs of VTE (edema of calf/thigh redness, pain)   Monitor for signs and symptoms of a pulmonary embolism (dyspnea, tachypnea, tachycardia, confusion)   Encourage/assist patient as needed to turn, cough, and perform deep breathing every 2 hours     Problem: Ineffective Gas Exchange  Goal: Effective breathing pattern  Outcome: Progressing  Flowsheets (Taken 08/25/2022 1051)  Effective breathing pattern:   Maintain CO2 level per LIP order   Monitor end tidal CO2 level per LIP order   Teach/reinforce use of ordered respiratory interventions (ie. CPAP, BiPAP, Incentive Spirometer, Acapella)     Problem: Pain interferes with ability to perform ADL  Goal: Pain at adequate level as identified by patient  Outcome: Progressing  Flowsheets (Taken 08/25/2022 0249 by Carnella Guadalajara, RN)  Pain at adequate level as identified by patient:   Identify patient comfort function goal   Offer non-pharmacological pain management interventions   Evaluate patient's satisfaction with pain management progress   Evaluate if patient comfort function goal is met     Problem: Side Effects from Pain Analgesia  Goal: Patient will experience minimal side effects of analgesic therapy  Outcome: Progressing  Flowsheets (Taken 08/25/2022 1051)  Patient will experience minimal side effects of analgesic therapy:   Monitor/assess patient's respiratory status (RR depth, effort, breath sounds)   Prevent/manage side effects per LIP orders (i.e. nausea, vomiting, pruritus, constipation, urinary retention, etc.)   Evaluate for opioid-induced sedation with appropriate assessment tool (i.e. POSS)   Assess for changes in cognitive function

## 2022-08-27 ENCOUNTER — Other Ambulatory Visit: Payer: Self-pay

## 2022-08-27 ENCOUNTER — Telehealth (INDEPENDENT_AMBULATORY_CARE_PROVIDER_SITE_OTHER): Payer: Medicare Other

## 2022-08-27 ENCOUNTER — Telehealth (INDEPENDENT_AMBULATORY_CARE_PROVIDER_SITE_OTHER): Payer: Self-pay

## 2022-08-27 DIAGNOSIS — I2609 Other pulmonary embolism with acute cor pulmonale: Secondary | ICD-10-CM

## 2022-08-27 DIAGNOSIS — Z952 Presence of prosthetic heart valve: Secondary | ICD-10-CM

## 2022-08-27 DIAGNOSIS — I5020 Unspecified systolic (congestive) heart failure: Principal | ICD-10-CM | POA: Insufficient documentation

## 2022-08-27 LAB — LAB USE ONLY - CBC WITH DIFFERENTIAL
Absolute Basophils: 0.01 10*3/uL (ref 0.00–0.08)
Absolute Eosinophils: 0.13 10*3/uL (ref 0.00–0.44)
Absolute Immature Granulocytes: 0.01 10*3/uL (ref 0.00–0.07)
Absolute Lymphocytes: 1.43 10*3/uL (ref 0.42–3.22)
Absolute Monocytes: 0.4 10*3/uL (ref 0.21–0.85)
Absolute Neutrophils: 1.31 10*3/uL (ref 1.10–6.33)
Absolute nRBC: 0 10*3/uL (ref <=0.00)
Basophils %: 0.3 %
Eosinophils %: 4 %
Hematocrit: 36.5 % — ABNORMAL LOW (ref 37.6–49.6)
Hemoglobin: 11.7 g/dL — ABNORMAL LOW (ref 12.5–17.1)
Immature Granulocytes %: 0.3 %
Lymphocytes %: 43.5 %
MCH: 33.4 pg (ref 25.1–33.5)
MCHC: 32.1 g/dL (ref 31.5–35.8)
MCV: 104.3 fL — ABNORMAL HIGH (ref 78.0–96.0)
MPV: 11 fL (ref 8.9–12.5)
Monocytes %: 12.2 %
Neutrophils %: 39.7 %
Platelet Count: 73 10*3/uL — ABNORMAL LOW (ref 142–346)
Preliminary Absolute Neutrophil Count: 1.31 10*3/uL (ref 1.10–6.33)
RBC: 3.5 10*6/uL — ABNORMAL LOW (ref 4.20–5.90)
RDW: 13 % (ref 11–15)
WBC: 3.29 10*3/uL (ref 3.10–9.50)
nRBC %: 0 /100 WBC (ref <=0.0)

## 2022-08-27 LAB — BASIC METABOLIC PANEL
Anion Gap: 4 — ABNORMAL LOW (ref 5.0–15.0)
BUN: 22 mg/dL (ref 9–28)
CO2: 23 mEq/L (ref 17–29)
Calcium: 9 mg/dL (ref 7.9–10.2)
Chloride: 114 mEq/L — ABNORMAL HIGH (ref 99–111)
Creatinine: 1.5 mg/dL (ref 0.5–1.5)
GFR: 42.6 mL/min/{1.73_m2} — ABNORMAL LOW (ref >=60.0)
Glucose: 79 mg/dL (ref 70–100)
Potassium: 4.5 mEq/L (ref 3.5–5.3)
Sodium: 141 mEq/L (ref 135–145)

## 2022-08-27 LAB — PHOSPHORUS: Phosphorus: 3 mg/dL (ref 2.3–4.7)

## 2022-08-27 LAB — MAGNESIUM: Magnesium: 1.8 mg/dL (ref 1.6–2.6)

## 2022-08-27 NOTE — Progress Notes (Signed)
Ambulatory Care Management Progress Note     08/27/22 1640   Pt Outreach/Care Plan Metrics   [employer] for Outreach MSSP   Care Plan Progress Refused   Outreach Status for Metrics listing Spoke with Member/Care Giver  (No CM need at this time, per son,)         Wardell Heath, RN, BSN, CCM, [facility]  Patient Care Navigator  Ambulatory Care Management  [facility]  [phone]

## 2022-08-27 NOTE — Consults (Signed)
Start [facility] Note  Home Health Referral    Referral from Sherryll Burger, RN  (Case Manager) for home health care upon discharge.    By [employer], the patient has the right to freely choose a home care provider.    A company of the patients choosing. We have supplied the patient with a listing of providers in your area who asked to be included and participate in Medicare.   [facility] a home care agency that provides adult home care services and participates in Medicare   The preferred provider of your insurance company. Choosing a home care provider other than your insurance company's preferred provider may affect your insurance coverage.      Home Health Discharge Information    Your doctor has ordered Skilled Nursing, Physical Therapy, and Occupational Therapy in-home service(s) for you while you recuperate at home, to assist you in the transition from hospital to home.    The agency that you or your representative chose to provide the service:  Name of Home Health Agency Placement: Other (comment box) ([facility] [phone]) [phone])]      The above services were set up by:  Hoover Brunette, RN (Home Health Liaison)   Phone: [phone]      IF YOU HAVE NOT HEARD FROM YOUR HOME HEALTH AGENCY WITHIN 24-48 HOURS AFTER DISCHARGE PLEASE CALL YOUR AGENCY TO ARRANGE A TIME FOR YOUR FIRST VISIT. FOR ANY SCHEDULING CONCERNS OR QUESTIONS RELATED TO HOME HEALTH, SUCH AS TIME OR DATE PLEASE CONTACT YOUR HOME HEALTH AGENCY AT THE NUMBER LISTED ABOVE.    Additional comments:        START PATIENT REGISTRATION INFORMATION     Order Information  Order Signing Physician: Gayleen Orem, MD    Service Ordered RN ?: Yes  Service Ordered PT ?: Yes  Service Ordered OT ?: Yes  Service Ordered ST ?: No    Service Ordered MSW?: No    Service Ordered HHA?: No    Following Physician: Erasmo Score, MD   Following Physician Phone: [phone]   Overseeing Physician: N/A  (Required for Residents only)    Agreeable to Follow?: N/A  Spoke with: N/A  Date/Time of Call: 08/27/22 10:50 AM      Care Coordination   SOC Call from [facility] Required?: no  Same Day [facility]?: no  Primary Care Physician:Sean Arlester Marker, MD  Primary Care Physician Phone:[phone]  Primary Care Physician Address: [address] / [place] [address]  PCP NPI: [id number]  Visit Instructions: N/A  Service Discharge Location Type: Home  Service Facility Name: N/A  Service Floor Facility: N/A  Service Room No: N/A    Demographics  Patient Last Name: [name]   Patient First Name: Rick  Language/Communication Barrier: no  Service Address: [address]  [place] [address]   Service Home Phone: [phone] (home)   Other phone numbers:    Telephone Information:   Mobile [phone]     Emergency Contact: Extended Emergency Contact Information  Primary Emergency Contact: Minette Headland II,Diandre  Address: [address]           [place], [place] [address] [place]  Home Phone: [phone]  Mobile Phone: [phone]  Relation: Son  Secondary Emergency Contact: Avelino Leeds  Address: [address]           [place], [place] [address] [place]  Home Phone: [phone]  Relation: Daughter in law    Admission Information  Admit  Date: 08/24/2022  Patient Status at discharge: Inpatient  Admitting Diagnosis: AKI (acute kidney injury) [N17.9]  Acute kidney failure [N17.9]     Caregiver Information  Caregiver First Name: N/A  Caregiver Last Name: N/A  Caregiver Relationship to Patient: Other  Caregiver Phone Number: N/A  Caregiver Notes: N/A            Data processing manager Information  Primary Subscriber:   Primary Subscriber Relation To Guarantor:   Primary Payor:   Primary Plan:   Primary Group #:    Primary Subscriber ID:    Primary Subscriber DOB:   Secondary Insurance Information  Secondary Subscriber:   Secondary Subscriber Relation To Guarantor:   Secondary Payor:   Secondary Plan:   Secondary Group #:    Secondary Subscriber ID:   Secondary Subscriber DOB:   HITECH  NO      END PATIENT REGISTRATION INFORMATION       Diagnosis: AKI (acute kidney injury) [N17.9]  Acute kidney failure [N17.9]    Start [facility] Summary        Additional Comments:        Home Health face-to-face (FTF) Encounter (Order [id number])  Consult  Date: 08/27/2022 Department: [facility] [address] Ordering/Authorizing: Gayleen Orem, MD     Order Information    Order Date/Time Release Date/Time Start Date/Time End Date/Time   08/27/22 10:46 AM None 08/27/22 10:44 AM 08/27/22 10:44 AM     Order Details    Frequency Duration Priority Order Class   Once 1  occurrence Routine Hospital Performed     Standing Order Information    Remaining Occurrences Interval Last Released     0/1 Once 08/27/2022              Provider Information    Ordering User Ordering Provider Authorizing Provider   Hoover Brunette, RN Don Broach, Maris Berger, MD Gayleen Orem, MD   Attending Provider(s) Admitting Provider PCP   Larae Grooms, Erasmo Downer, MD; Zada Finders, MD; Rinaldo Cloud, MD; Gayleen Orem, MD Zada Finders, MD Erasmo Score, MD     Verbal Order Info    Action Created on Order Mode Entered by Responsible Provider Signed by Signed on   Ordering 08/27/22 1046 Telephone with readback Hoover Brunette, RN Gayleen Orem, MD             Comments    Provider to follow: Erasmo Score, MD -- [phone]    Home nursing required for skilled assessment including cardiopulmonary assessment and dietary education for disease management, and medication instruction. Home PT/OT required for gait and balance training, strengthening, mobility, fall prevention, and ADL training.    AKI (acute kidney injury) [N17.9]  Acute kidney failure [N17.9]                Home Health face-to-face (FTF) Encounter: Patient Communication     Not Released  Not seen         Order Questions    Question Answer   Date I saw the patient face-to-face: 08/27/2022   Evidence this patient is  homebound because: B.  Profound weakness, poor balance/unsteady gait d/t illness/treatment/procedure    C.  Decreased endurance, strength, ROM, cadence, safety/judgment during mobility    F.  Deconditioned due to advance disease process requiring assistance to leave home    G.  Fall risk due to impaired coordination, gait and decreased balance    J.  Advanced age  with frailty factors affecting safe ambulation & needs supervision    L.  Transfer/ambulation requires stand by assist and verbal cues to perform safely    M.  Unsteady gait, poor ambulation with history of falls   Medical conditions that necessitate Home Health care: B.  Functional impairment due to recent hospitalization/procedure/treatment    C.  Risk for complication/infection/pain requiring follow up and monitoring    D.  Chronic illness & risk for re-hospitalization due to unstable disease status    E.  Exacerbation of disease requiring follow up monitoring   Per clinical findings, following services are medically necessary: Skilled Nursing    PT    OT   Clinical findings that support the need for Skilled Nursing. SN will: C. Monitor for signs and symptoms of exacerbation of disease and management    D. Review medication reconciliation, manage and educate on use and side effects    G. Educate on new diagnosis, treatment & management to prevent re-hospitalization    H. Assess cardiopulmonary status and monitor for signs &symptoms of exacerbation    I.  Educate dietary and or fluid restrictions and weight management   Clinical findings that support the need for Physical Therapy. PT will A.  Evaluate and treat functional impairment and improve mobility    C.  Educate on weight bearing status, stair/gait training, balance & coordination    D.  Provide services to help restore function, mobility, and releive pain    E.  Educate on functional mobility; bed, chair, sit, stand and transfer activities    F.  Perform home safety assessment & develop safe in  home exercise program    H.  Educate on the safe use of assistive device/ durable medical equipment    G.  Implement activities to improve stance time, cadence & step length    I.  Instruct on restorative activities to restore ability to perform ADL   OT will provide assistance with: Home program to improve ability to perform ADLs    Recovery and maintenance skills    Basic motor function and reasoning abilities    Restorative program to improve mobility and independence    Strategies to compensate for loss of function   Other (please specify) See comment section for orders.                    Process Instructions    Please select Home Care Services medically necessary.    Based on the above findings, I certify that this patient is confined to the home and needs intermittent skilled nursing care, physical therapry and / or speech therapy or continues to need occupational therapy. The patient is under my care, and I have initiated the establishment of the plan of care. This patient will be followed by a physician who will periodically review the plan of care.     Collection Information            Consult Order Info    ID Description Priority Start Date Start Time   [id number] Home Health face-to-face (FTF) Encounter Routine 08/27/2022 10:44 AM   Provider Specialty Referred to   ______________________________________ _____________________________________         Acknowledgement Info    For At Acknowledged By Acknowledged On   Placing Order 08/27/22 1046 Lavonna Rua, RN 08/27/22 1047                     Verbal Order Info  Action Created on Order Mode Entered by Responsible Provider Signed by Signed on   Ordering 08/27/22 1046 Telephone with readback Hoover Brunette, RN Gayleen Orem, MD             Patient Information    Patient Name  Rick, [name] Legal Sex  Male DOB  10/26/1924       Reprint Order Requisition    Home Health face-to-face (FTF) Encounter (Order #[id number]) on 08/27/22       Additional  Information    Associated Reports External References   Priority and Order Details [facility]            [facility]        Patient Name: Rick, [name]     MRN: [id number]      CSN: [id number]         Account Information     Hosp Acct #   [id number] Patient Class   Inpatient Service  Medicine Accommodation Code  Semi-Private      Admission Information     Admitting Physician:  Attending Physician: Zada Finders, MD  Gayleen Orem, MD [address]  [address] L&D Status      Admitting Diagnosis: AKI (acute kidney injury); Acute kidney failure Room / Bed  A2121/A2121-A L&D - Last Menstrual Cycle      Chief Complaint:       Admit Type:  Admit Date/Time:  Discharge Date/Time: Urgent  08/24/2022 / 2154   /  Length of Stay: 3 Days    L&D EDD   Estimated Date of Delivery: None noted.      Patient Information              Home Address: [address]  [place] [address] Employer:  Employer Address:       ,     Main Phone: [phone] Employer Phone:     SSN: [id number]       DOB: 1924/09/28 (89 yrs)       Sex: Male Primary Care Physician: Erasmo Score, MD   Marital Status: Widowed Referring Physician:       Iva Lento, MD   Race: Black or African American       Ethnicity: Not of Hispanic/Latino/S*       Emergency Contacts  Name Home Phone Work Phone Mobile Phone Relationship DEANGELO, BERNS [phone]   [phone] THOR, NANNINI [phone]     Daughter in*           Guarantor Information     Guarantor Name: ELZA, SORTOR Guarantor ID: [id number]   Guarantor Relationship to Pt: Self Guarantor Type: Personal/Family   Guarantor DOB:    1924-03-13 Billing Indicator:        Guarantor Address: [address], [place] [address]          Guarantor Home Phone: [phone] Guarantor Employer:        Guarantor Work Phone:   Pensions consultant Emp Phone:                     Chief Strategy Officer Name: [employer] A AND B Subscriber Name: [employer]   Insurance Address:    [address]  [place] [address] Subscriber DOB: June 08, 1924      Subscriber ID: [id number]   Insurance Phone:   Pt Relationship to Sub:   [employer]  ID:         Group Name:   Preauthorization #: NPR   Group #:   Preauthorization Days:        Secondary Insurance     Insurance Name: [facility] Subscriber Name: [facility]   Insurance Address:    [address]  [place], [place] [address] Subscriber DOB: 1925/02/04      Subscriber ID: [id number]   Insurance Phone: [phone] Pt Relationship to Sub:   Self   Insurance ID:         Group Name:   Preauthorization #:     Group #:   Preauthorization Days:        [employer] Name: - Subscriber Name:     Community education officer Address:       ,   Statistician DOB:        Subscriber ID:     Press photographer:   Pt Relationship to Sub:       Insurance ID:         Group Name:   Preauthorization #:     Group #:   Preauthorization Days:           08/27/2022 10:51 AM       End P[facility] Summary     Discharge Date:  08/27/2022    Referral Source  Signed by: Hoover Brunette, RN  Date Time: 08/27/22 10:50 AM      End P[facility] Note

## 2022-08-27 NOTE — Telephone Encounter (Signed)
Called patient to schedule a Cardiology consult (hospital follow up)  based on referral order. Spoke with patient he was getting discharge he requested to be called on 08/28/22.    Two appointments needed. First one around 5 days after discharge which can be with NP(post hospital HF) and the second appointment around (2 weeks f/u HF) can be with MD or NP.     [facility] Cardiology   T [phone]

## 2022-08-27 NOTE — Progress Notes (Signed)
CARDIOLOGY HOSPITAL NOTE  Gilford Raid, NP   [facility] Cardiology  Office phone:  [phone]     [facility] phone [phone]  [facility] phone [phone]    Date:  08/27/2022  10:17 AM    Patient:  Rick [name].  DOB: 1924-04-24.  89 y.o.  male , MRN [id number]   Attending:  Gayleen Orem, MD   Admission date:  08/24/2022   Primary cardiologist: Dr. Rodman Key    ASSESSMENT & PLAN:     89 y.o. male with prior medical history for HFrEF (LVEF 30%), coronary artery disease, TAVR, hypertension, hyperlipidemia, monoclonal gammopathy and dementia who was sent to ED on 08/24/22 with hypotension (90/50).  He was hyperkalemic with AKI on presentation.  His heart rates was down to 30s overnight with 3-second pause for which a cardiac consultation was requested.     Primary diagnosis: Acute kidney injury    Bradycardia and intermittent Mobitz I  -Bradycardia down to 29  -multiple pauses ~2.0, longest pause of 2.9 seconds  -intermittent Mobitz II  -EP consultation yesterday: do not believe SA/AV block will be life threatening. EP discussed with son who appreciated quality of life at his advanced age and did not opt for ICD/PPM.   -okay for discharge from a cardiac perspective, no further work-up or medication changes.     PVCs/NSVT  -high volume  -unable to tolerate medications due to SA and AV blocks (see above)  -4 brief runs of NSVT overnight, longest episode was 7 beats    Acute kidney injury  -likely in setting of dehydration? Now resolved.   -BUN 56 with Cr 2.5 on arrival ->BUN 45 Cr 1.9 -> BUN 25 Cr 1.3->BUN 22 Cr 1.5  -NT-proBNP  548    HFrEF  -LVEF 30%   -euvolemic fluid   -Daily weight 147-155 lbs.   -CXR without any acute process    Cardiomyopathy  -suspect mixed ischemic and non-ischemic, no further workup per family due to advanced age, medically managing  -Guideline Directed Medical Therapy   Beta Blocker: avoiding due to bradycardia    ACEi/ARB/ARNI: 1/2 tablet  Entresto bid   NWG:NFAOZHYQM 12.5 mg daily (at home, currently on hold due to hyperkalemia and AKI on presentation at hospital)   SGLT2i: Jardiance 10 mg    Diuretic:none    Thrombocytopenia  -Platelets 73    Coronary artery disease  -rosuvastatin 5 mg daily     Goals of care  -Palliative consultation clarified goals of care 08/26/22 in light of NSVT, pauses and inability to tolerate pharmaceutical intervention and choice of non-invasive support due to advanced age and co-morbidities. Goals of care are for no further aggressive interventions with goal of discharge to SNF/rehab.     Interval history:     He denies any chest discomfort, palpitations, edema, shortness of breath, orthopnea.  He reports he feels fine.      PAST MEDICAL HISTORY:     Past Medical History:   Diagnosis Date    Arrhythmia 2021    RBBB    Atrial enlargement, bilateral Echo result 04/04/19    Bilateral cataracts     Son reports pt has the ability to lay flat for 45+min without CP or SOB as reported on 05/14/22 for DOS  06/04/22    CAD (coronary artery disease) 05/28/19 Cath results.    Severe 3V CAD.  Patent LIMA to LAD, patent SVG to OM 1 and OM 2 of circumflex CA, &  patent SVG to RCA.    Chronic combined systolic and diastolic heart failure 310/21 OV Dr. Penny Pia    EF=30%    Dementia 04/2014    Difficulty walking 06/2016    Post surgery of lt knee, rt femur/ uses walker and wheelchair for long distances prn    Disorder of prostate     Dyslipidemia 310/21 OV Dr. Penny Pia    Dyspnea on exertion 02/2018    occas per son report on 05/14/22, doing better    Hearing loss     mild . does not like to wear/ bilat    HTN (hypertension) 310/21 OV Dr. Penny Pia    Son reports unaware of htn dx as reported on 3/114/24    Low back pain     muscular    Primary cardiomyopathy 11/2017    Urinary incontinence     occas  2-3 monthly at qhs or in am    VTE (venous thromboembolism) Feb 2023    In lungs, combined with bronchitis     Past Surgical History:   Procedure  Laterality Date    COLONOSCOPY, DIAGNOSTIC (SCREENING)      CORONARY ARTERY BYPASS GRAFT  2003     approx year: LIMA to LAD,  SVG to OM 1 & OM 2 of circumflex CA, & SVG to RCA    FRACTURE SURGERY Right 06/2016    femur    HERNIORRHAPHY, INGUINAL Right 04/29/2021    Procedure: OPEN RIGHT HERNIORRHAPHY, INGUINAL WITH MESH;  Surgeon: Towanda Octave, MD;  Location: [facility];  Service: General;  Laterality: Right;    PERCUTANEOUS VALVULOPLASTY - AORTIC N/A 07/12/2019    Procedure: TAVR #2;  Surgeon: Arlyss Queen, MD PhD;  Location: [facility] C[facility];  Service: Cardiovascular;  Laterality: N/A;  23 HR OB    REPLACEMENT, AORTIC VALVE , EDWARDS SAPIEN N/A 07/12/2019    Procedure: REPLACEMENT, AORTIC VALVE , EDWARDS SAPIEN -- TAVR Valve:   S3 29mm -- Access: RTF -- IC: Dr. Penny Pia;  Surgeon: Christene Lye, MD;  Location: [facility];  Service: Cardiothoracic;  Laterality: N/A;    RIGHT & LEFT HEART CATH POSSIBLE PCI N/A 05/26/2019    Procedure: RIGHT & LEFT HEART CATH POSSIBLE PCI;  Surgeon: Arlyss Queen, MD PhD;  Location: [facility] C[facility];  Service: Cardiovascular;  Laterality: N/A;  [id number] scheduled with Maira in dr's office. OP. kr    TOTAL KNEE ARTHROPLASTY Left 2017    TOTAL KNEE ARTHROPLASTY Right 2005       SOCIAL HISTORY:     Social History     Tobacco Use    Smoking status: Never    Smokeless tobacco: Never   Substance Use Topics    Alcohol use: Never       FAMILY HISTORY:   family history includes No known problems in his brother, father, mother, and sister.    ALLERGIES:   No Known Allergies     MEDICATIONS:    INFUSION MEDS:      sodium chloride 75 mL/hr at 08/26/22 1924      SCHEDULED MEDS:     Current Facility-Administered Medications   Medication Dose Route Frequency    docusate sodium  100 mg Oral BID    empagliflozin  10 mg Oral Daily    heparin (porcine)  5,000 Units Subcutaneous Q12H SCH    rosuvastatin  5 mg Oral Daily    sacubitril-valsartan  0.5 tablet Oral BID    tamsulosin  0.4  mg  Oral QPM      PRN MEDS:   acetaminophen, benzocaine-menthol, benzonatate, carboxymethylcellulose sodium, dextrose **OR** dextrose **OR** dextrose **OR** glucagon (rDNA), melatonin, naloxone, saline     DIAGNOSTICS:   Telemetry: sinus bradycardia into 30s and 40s with brief NSVT runs and pauses up to 3 seconds    EKG 08/24/22 14:23: sinus bradycardia with 2nd degree Mobitz I, RBBB and Left anterior fascicular block    EKG 08/24/22 13:01: sinus bradycardia with frequent PVCs, left axis deviation and RBBB    Echocardiogram 04/09/21:   * The left ventricular cavity size is dilated.  Left ventricular wall  thickness is normal.  Left ventricular systolic function is severely decreased  with an estimated ejection fraction of 25-30%.    * The right ventricular cavity size is upper limits of normal in size.  Mildly decreased right ventricular systolic function.    * Severe bi-atrial dilation.    * There is a 29mm Sapien 3 TAVR valve in aortic position that is normally  functioning.    * There is severe tricuspid regurgitation.  Moderate pulmonary hypertension  with estimated right ventricular systolic pressure of 54 mmHg.    * There is moderate pulmonic valve regurgitation.    * There is mild to moderate mitral regurgitation.    * Elevated right atrial pressure.    * Compared to the prior study dated 08/25/2019 (personally reviewed),  tricuspid regurgitation increased. There are frequent PVC's throughout the  length of this study. Otherwise, no significant changes.    TTE(09/05/2019):  Mildly dilated LV, LVEF 30%, mildly increased LVH,LVOT VTI is low consistent with low stroke volume (58mL) and cardiac output (3.3L/min).  Stroke volume index 28mL/m2, mildly dilated RV cavity with decreased systolic function, RV FAC 11.2%, RV S' 6.6cm/s.  TAPSE 16mm. known 29mm Sapien 3 TAVR valve in aortic position. It appears well seated. DVI 0.3 and EOA 1.3cm2. These values are derived from a low stroke volume (low LVOT VTI) and may reflect an  underestimation of valve area due to low output state.  Mild MR, mild-moderate TR, moderate pHTN, estimated RVSP 46 mmHg, dilated ascending aorta compared to previous study 07/13/2019, TAVR hemodynamics have changed, but may be underestimated due to low output state.     Severe AS s/p TAVR 07/12/2019     CAD s/p  CABG in 2003.   Cardiac cath  06/2019 showed patent LIMA to LAD, patent SVG to OM1, patent SVG to OM2, and patent SVG to RCA.     LABS:     Recent Labs   Lab 08/27/22  0331 08/26/22  0519 08/25/22  0345   WBC 3.29 3.06* 3.51   Hemoglobin 11.7* 11.8* 11.3*   Hematocrit 36.5* 36.0* 34.9*   Platelet Count 73* 73* 72*     Recent Labs   Lab 08/27/22  0331 08/26/22  0519 08/25/22  0345   Sodium 141 141 143   Potassium 4.5 4.9 4.3   Chloride 114* 115* 114*   CO2 23 22 25    BUN 22 25 45*   Creatinine 1.5 1.3 1.9*   GFR 42.6* 50.6* 32.1*   Glucose 79 85 113*   Calcium 9.0 9.3 9.3                FLUID BALANCE AND WEIGHT:  Last filed weight: Weight: 71.2 kg (156 lb 15.5 oz)  Weight on Admission: Weight: 70 kg (154 lb 5.2 oz)  Net IO Since Admission: -2,408 mL [08/27/22 1017]     Intake/Output Summary (Last 24 hours) at 08/27/2022 0700  Last data filed at 08/27/2022 0600  Gross per 24 hour   Intake 1701 ml   Output 1600 ml   Net 101 ml   Weight change: 0.8 kg (1 lb 12.2 oz)    Last 3 Weights for the past 72 hrs (Last 3 readings):   Weight   08/27/22 0325 71.2 kg (156 lb 15.5 oz)   08/26/22 1540 70.7 kg (155 lb 13.8 oz)   08/26/22 0500 69.9 kg (154 lb 1.6 oz)        PHYSICAL EXAM:                                                                 BP 144/64   Pulse (!) 52   Temp 97.7 F (36.5 C) (Oral)   Resp 16   Ht 1.727 m (5\' 8" )   Wt 71.2 kg (156 lb 15.5 oz)   SpO2 95%   BMI 23.87 kg/m     General Appearance:  Breathing comfortable, no acute distress  Head:  normocephalic  Eyes:  EOM's intact, nonicteric sclera  Neck:  No carotid bruit or jugular venous distension  Lungs:   Clear to auscultation upper and middle lobes and diminished in bases bilaterally, no wheezes, rhonchi or rales, good respiratory effort, symmetric chest expansion  Cardiac:  RRR, normal S1, S2, no S3, no S4, no rub, Left upper and lower grade 2/6 systolic murmur,left lower border click, PMI holosystolic grade 3/4 murmur   Abdomen:  Soft, non-tender, no rebound or guarding, non-distended, positive bowel sounds  Extremities:  No cyanosis or clubbing. No arthritis or deformities. No LE edema  Vascular: 2+ radial and distal pulses bilaterally  Neurologic:  Alert and oriented x2, mood and affect normal

## 2022-08-27 NOTE — Discharge Summary (Signed)
[facility]      Patient: Rick [name] [facility]  Admission Date: 08/24/2022   DOB: [birth year]  Discharge Date: 08/27/2022    MRN: [id number]  Discharge Attending: A , MD     Referring Physician: Erasmo Score, MD  PCP: Erasmo Score, MD       DISCHARGE SUMMARY     Discharge Information   Admission Diagnosis:   Acute kidney failure    Discharge Diagnosis:   Active Hospital Problems    Diagnosis    Acute kidney failure      Hypotension  Bradycardia  3-second pause  Intermittent Mobitz type II AV block  Acute kidney injury  Chronic heart failure with reduced EF  Makes it ischemic and nonischemic cardiomyopathy  Dementia  Chronic thrombocytopenia  Coronary artery disease  History of PE      Admission Condition: Acutely sick  Discharge Condition: Stable and improved  Consultants: Cardiology  Functional Status: Ambulate with assistant  Discharged to: Home with home health       Hospital Course   Presentation History   Rick [name] is a 89 y.o. male with history of dementia, monoclonal gammopathy, heart failure with reduced EF 30%, CAD, hypertension, hyperlipidemia admitted with Acute kidney failure and hypotension found to have bradycardia and 3-second pause   Per admission H&P:    Hospital Course (3 Days)     Hypotension  By the time patient arrived blood pressure was normal  Entresto resumed with holding parameter  Spironolactone and Lasix resumed on discharge  Blood pressure has been stable throughout patient's stay  Outpatient follow-up with PCP     Bradycardia  3-second pause  Intermittent Mobitz type II AV block  Cardiology evaluated and recommended EP consultation  EP evaluated and recommended for now no intervention  Heart rate dropped to the low 30s  Discussed with general cardiology, given this is less likely compatible with life without pacemaker recommended palliative team consultation  CODE STATUS changed to DNR/DNI  Discussed with the son who would not like pacemaker insertion given  dementia and advanced age which is understandable and appropriate  PT OT recommended home with home health  Does not want full comfort care at this point  Memantine and donepezil discontinued because of bradycardia  Cleared for discharge from cardiology standpoint of view  Outpatient follow-up with cardiology     Acute kidney injury  Likely in setting of dehydration  Creatinine back to baseline 1.9--1.3     Chronic heart failure with reduced EF  Makes it ischemic and nonischemic cardiomyopathy  EF is 30%  Chest x-ray unremarkable, proBNP  548  Currently euvolemic  Continue Entresto and Jardiance  Cardiology recommended to discharge patient with Lasix  Outpatient follow-up with cardiology    Dementia  Donepezil and Memantine discontinued  Outpatient follow-up with PCP    Chronic thrombocytopenia  Stable outpatient follow-up with PCP    Coronary artery disease  Continue statin     History of PE  Patient is not on anticoagulation         Plan discussed with patient and son in detail, question answered concern addressed.       Progress Note/Physical Exam at Discharge     Subjective:Patient is stable for discharge.    Vitals:    08/27/22 0057 08/27/22 0325 08/27/22 0827 08/27/22 1228   BP: 130/57 144/64  162/70   Pulse: (!) 31 (!) 50 (!) 52 60   Resp:  18 16 18    Temp:  97.3 F (36.3 C) 97.7 F (36.5 C) 97.5 F (36.4 C)   TempSrc:  Axillary Oral Oral   SpO2: 99% 99% 95% 98%   Weight:  71.2 kg (156 lb 15.5 oz)     Height:               General: NAD, AAOx1  HEENT: Sclera anicteric, no conjunctival injection, OP: Clear, MMM  Neck: Supple, FROM, no LAD  Cardiovascular: RRR, no m/r/g  Lungs: CTAB, no w/r/r  Abdomen: Soft, +BS, NT/ND, no masses, no g/r  Extremities: No C/C/E  Skin: No rashes or lesions noted  Neuro: No Focal neurological deficits       Diagnostics     Labs/Studies Pending at Discharge: No    Last Labs   Recent Labs   Lab 08/27/22  0331 08/26/22  0519 08/25/22  0345   WBC 3.29 3.06* 3.51   RBC 3.50* 3.50*  3.31*   Hemoglobin 11.7* 11.8* 11.3*   Hematocrit 36.5* 36.0* 34.9*   MCV 104.3* 102.9* 105.4*   Platelet Count 73* 73* 72*       Recent Labs   Lab 08/27/22  0331 08/26/22  0519 08/25/22  0345 08/24/22  1447   Sodium 141 141 143 140   Potassium 4.5 4.9 4.3 5.4*   Chloride 114* 115* 114* 109   CO2 23 22 25 26    BUN 22 25 45* 56*   Creatinine 1.5 1.3 1.9* 2.5*   Glucose 79 85 113* 79   Calcium 9.0 9.3 9.3 10.2   Magnesium 1.8 1.9  --   --        Microbiology Results (last 15 days)       Procedure Component Value Units Date/Time    COVID-19 and Influenza ([place]) (symptomatic) [[id number]]  (Normal) Collected: 08/24/22 1448    Order Status: Completed Specimen: Swab from Anterior Nares Updated: 08/24/22 1519     SARS-CoV-2 (COVID-19) RNA Not Detected     Influenza A RNA Not Detected     Influenza B RNA Not Detected    Narrative:      A result of "Detected" indicates POSITIVE for the presence of viral RNA  A result of "Not Detected" indicates NEGATIVE for the presence of viral RNA    Test performed using the Roche cobas [place] SARS-CoV-2 & Influenza A/B assay. This is a multiplex real-time RT-PCR assay for the detection of SARS-CoV-2, influenza A, and influenza B virus RNA. Viral nucleic acids may persist in vivo, independent of viability. Detection of viral nucleic acid does not imply the presence of infectious virus, or that virus nucleic acid is the cause of clinical symptoms. Negative results do not preclude SARS-CoV-2, influenza A, and/or influenza B infection and should not be used as the sole basis for diagnosis, treatment or other patient management decisions. Invalid results may be due to inhibiting substances in the specimen and recollection should occur.              Patient Instructions   Discharge Diet: Heart healthy diet  Discharge Activity: As tolerated    Follow Up Appointment:   Follow-up Information       Erasmo Score, MD Follow up in 3 day(s).    Specialty: Family Medicine  Contact information:  [address]  [place] [address]  [phone]               [name], MD Follow up in 1 week(s).    Specialty: Cardiology  Contact information:  [address]  [place] [address]  [phone]                              Discharge Medications:     Medication List        CONTINUE taking these medications      acetaminophen 500 MG tablet  Commonly known as: TYLENOL     docusate sodium 100 MG capsule  Commonly known as: COLACE     empagliflozin 10 MG tablet  Commonly known as: JARDIANCE  Take 1 tablet (10 mg) by mouth daily     furosemide 20 MG tablet  Commonly known as: LASIX  Take 1 tablet (20 mg) by mouth daily     multivitamin capsule     rosuvastatin 20 MG tablet  Commonly known as: CRESTOR     saccharomyces boulardii 250 MG capsule  Commonly known as: FLORASTOR     sacubitril-valsartan 24-26 MG Tabs per tablet  Commonly known as: ENTRESTO  Take 0.5 tablets by mouth 2 (two) times daily     spironolactone 25 MG tablet  Commonly known as: ALDACTONE  Take 0.5 tablets (12.5 mg) by mouth daily     tamsulosin 0.4 MG Caps  Commonly known as: FLOMAX  Take 1 capsule (0.4 mg) by mouth every evening     Vitamin D (Cholecalciferol) 50 MCG (2000 UT) Caps            STOP taking these medications      donepezil 5 MG tablet  Commonly known as: ARICEPT     memantine 10 MG tablet  Commonly known as: NAMENDA               Time spent examining patient, discussing with patient/family regarding hospital course, chart review, reconciling medications and discharge planning: 36 minutes.    Signed,  Gayleen Orem, MD    1:26 PM 08/27/2022

## 2022-08-27 NOTE — Discharge Instr - AVS First Page (Addendum)
[facility] DISCHARGE INSTRUCTIONS     Date of Admission: 08/24/2022    Date of Discharge: 08/27/2022    Discharge Physician: Gayleen Orem, MD    Thank you for choosing the [facility]  for your healthcare needs. It was a privilege caring for you. I am genuinely concerned about your health and comfort.      Below, please find detailed instructions regarding your medical care.     You were admitted for Acute kidney failure. It is important that you make your follow up appointments listed below.  Bring these discharge papers to your follow-up visit with your medical provider.   I cannot stress the importance of follow up enough.      Consultants: Cardiology    Pending Studies: None    Discharge Diet: Heart healthy diet    Surgery/Procedure: None    Further Instructions: Keep up with your medication and follow-up.  I have discontinued dementia medication memantine and donepezil because both could result in bradycardia [low heart rate] follow-up with PCP and cardiology.    Follow up Appointments:   Follow-up Information       Erasmo Score, MD Follow up in 3 day(s).    Specialty: Family Medicine  Contact information:  [address]  [place] [address]  [phone]                                 Please take your medications as prescribed. As we discussed, please review the attached handouts for more information about important side effects of your medication.    If you have any fever, chest pain, palpitations, shortness of breath or difficulty breathing, worsening nausea and vomiting, or lower leg pain please seek medical attention immediately.     Finally, below please find the results of imaging studies that you had in the hospital. Please review this with your primary care doctor.    You will likely be receiving a survey about the care you received in our hospital. It would mean a lot to me and all of the care team if you could fill that out and return the form. We're always looking to improve  and your feedback will help [place] do that.     I wish you good health. Please do not hesitate to contact me if I can be of assistance. I aim to provide excellent care.     Respectfully Yours,    Gayleen Orem, MD        If you are unable to obtain an appointment, unable to obtain newly prescribed medications, or are unclear about any of your discharge instructions please contact your discharging physician at [phone] (M-F, 8am-3pm) or weekends and after hours via the hospital operator [phone], hospital case manager, or your primary care physician.    Imaging Studies while hospitalized:  Chest AP Portable    Result Date: 08/24/2022   No active disease. Postsurgical changes. Cardiomegaly. Kinnie Feil, MD 08/24/2022 3:19 PM   Echo Results       Procedure Component Value Units Date/Time    Echo Adult TTE Complete [[id number]] Collected: 08/26/22 0808     Updated: 08/26/22 1403     IVS Diastolic Thickness (2D) 1.11     IVS Diastolic Thickness (2D) 1.033     LVID diastole (2D) 6.15     LVID diastole (2D) 6.376     LVID systole (2D) 4.5  BP Mod LV Ejection Fraction 41     LA Dimension (2D) 5.2     LA Dimension (2D) 5.1     AV Mean Gradient 16     AV Mean Gradient 16     AV Mean Gradient 16     AV Mean Gradient 16     AV Mean Gradient 20     AV Peak Velocity 2.76     AV Peak Velocity 2.54     AV Area (Cont Eq VTI) 1.01     AV Area (Cont Eq VTI) 1.143     Prox Ascending Aorta Diameter 3.9     Prox Ascending Aorta Diameter 4     MV Mean Gradient 2     MV E/A 0.9     MV E/A 0.928     RV Basal Diastolic Dimension 5.21     RV Systolic Pressure 49     RV Systolic Pressure 48.704     TAPSE 1.8     MV Area (PHT) 1.94     LA Volume Index (BP A-L) 54     MV E/e' (Average) 9.064     Site RV Size (AS) mildly dilated     RV Function Normal     Site RA Size (AS) mildly dilated     MV Regurgitation Severity mild     Site MV Stenosis (AS) mild     TV Regurgitation Severity moderate     AV Regurgitation Severity trace     Aortic  Stenosis Severity mild     Site PV Stenosis (AS) no Doppler evidence of pulmonic valve sclerosis or stenosis     PV Regurgitation Severity mild     Atrial Septum Findings No evidence of interatrial shunt by color Doppler.     Aortic Valve Findings The aortic valve is tricuspid.     Aortic Valve Findings There is trace aortic regurgitation.     Aortic Valve Findings There is normal function of the bioprosthetic aortic valve with a peak velocity of 2,76 m/s, mean gradient of 20 mmHg, aortic valve area of 1.1 cm by Continuity equation and a dimensionless index of 0.32.     Aortic Valve Findings There is a 29-mm Sapien 3 TAVR valve in aortic position. The mean AV gradient is 20 mmHg.     Pulmonary Valve Findings The pulmonic valve is structurally normal.     Pulmonary Valve Findings There is mild pulmonic regurgitation.     Mitral Valve Findings The mitral valve is thickened.     Mitral Valve Findings There is mild mitral regurgitation.     Tricuspid Valve Findings The tricuspid valve is structurally normal.     Tricuspid Valve Findings There is moderate tricuspid regurgitation.     Tricuspid Valve Findings Moderate pulmonary hypertension with estimated right ventricular systolic pressure of  49 mmHg.     Summary --     Marked bradycardia and pauses noted during the study. Compared to the prior study dated 04/09/21, LVEF is slightly improved and mean AV gradient is mildly higher. The left ventricle is moderately dilated. Left ventricular systolic function is mildly   decreased with an ejection fraction by Biplane Method of Discs of  41 %. The right ventricular cavity size is mildly dilated. The left atrium is severely dilated. The right atrium is mildly dilated. There is a 29-mm Sapien 3 TAVR valve in aortic   position. The mean AV gradient is 20 mmHg. There is mild mitral regurgitation. There  is moderate tricuspid regurgitation. Moderate pulmonary hypertension with estimated right ventricular systolic pressure of  49  mmHg. The ascending aorta is mildly   dilated at 4.0 cm in diameter.       Narrative:      Name:     Rick [name]  Age:     89 years  DOB:     Apr 20, 1924  Gender:     Male  MRN:     [id number]  Wt:     154 lb  BSA:     1.84 m2  HR:     39 bpm  Systolic BP:     147 mmHg  Diastolic BP:     66 mmHg  Technical Quality:     Adequate  Exam Date/Time:     08/26/2022 8:08 AM  Study Site:     [facility]  Exam Type:     ECHO ADULT TTE COMPLETE    Study Info  Indications      Acute on chronic heart failure -  Procedure    Complete two-dimensional, color flow and spectral Doppler transthoracic  echocardiogram is performed.    Staff  Sonographer:     Debbora Lacrosse RCS  Ordering Physician:     Delora Fuel    68 in      History/Risk Factors  Pulmonary Hypertension:     Yes  Pulmonary Embolism:     Yes  Coronary Artery Disease     Yes  Valve Replacement Type:     TAVR    Additional Patient History    Prior bioprosthetic aortic valve replacement.      Summary    * Left ventricular systolic function is mildly decreased with an ejection  fraction by Biplane Method of Discs of  41 %.    * The left ventricle is moderately dilated.    * The right ventricular cavity size is mildly dilated.    * The left atrium is severely dilated.    * The right atrium is mildly dilated.    * There is a 29-mm Sapien 3 TAVR valve in aortic position. The mean AV  gradient is 20 mmHg.    * There is mild mitral regurgitation.    * There is moderate tricuspid regurgitation.    * Moderate pulmonary hypertension with estimated right ventricular systolic  pressure of  49 mmHg.    * The ascending aorta is mildly dilated at 4.0 cm in diameter.    * Marked bradycardia and pauses noted during the study.    * Compared to the prior study dated 04/09/21, LVEF is slightly improved and  mean AV gradient is mildly higher.      Findings  Left Ventricle    The left ventricle is moderately dilated. There is moderate eccentric left  ventricular hypertrophy. Left ventricular  systolic function is mildly  decreased with an ejection fraction by Biplane Method of Discs of  41 %. Mild  global hypokinesis with minimal regional variation. There is Grade II  diastolic dysfunction (pseudonormal pattern) with elevated left atrial  pressure.  Right Ventricle    The right ventricular cavity size is mildly dilated. Normal right  ventricular systolic function.      Left Atrium    The left atrium is severely dilated.    Right Atrium    The right atrium is mildly dilated.    Atrial Septum    No evidence of interatrial shunt by color Doppler.  Aortic Valve    The aortic valve is tricuspid. There is trace aortic regurgitation. There is  a 29-mm Sapien 3 TAVR valve in aortic position. The mean AV gradient is 20  mmHg. There is normal function of the bioprosthetic aortic valve with a peak  velocity of 2,76 m/s, mean gradient of 20 mmHg, aortic valve area of 1.1 cm2  by Continuity equation and a dimensionless index of 0.32.    Pulmonary Valve    The pulmonic valve is structurally normal. There is mild pulmonic  regurgitation.    Mitral Valve    The mitral valve is thickened. There is mild mitral regurgitation.    Tricuspid Valve    The tricuspid valve is structurally normal. There is moderate tricuspid  regurgitation. Moderate pulmonary hypertension with estimated right  ventricular systolic pressure of  49 mmHg.      Aorta    The aortic root is not well visualized. The ascending aorta is mildly  dilated at 4.0 cm in diameter.    Inferior Vena Cava    The IVC is normal in size with > 50% respiratory variance consistent with  normal RA pressure of 3 mmHg.    Pericardium / Pleural Effusion    No pericardial effusion visualized.      Measurements  2D Measurements  ----------------------------------------------------------------------  Name                                 Value        Normal  ----------------------------------------------------------------------    Parasternal  2D  ----------------------------------------------------------------------   IVS Diastolic Thickness  (2D)                               1.03 cm     0.60-1.00   LVID Diastole (2D)                 6.38 cm     4.20-5.80    LVIW Diastolic Thickness  (2D)                               0.85 cm     0.60-1.05   LVID Systole (2D)                  4.50 cm     2.50-4.00   LVOT Diameter                      2.20 cm                 LA Dimension (2D)                  5.10 cm     3.00-4.10   Prox Asc Ao Diameter               4.00 cm     2.60-3.40     LV Ejection Fraction 2D  ----------------------------------------------------------------------  LV EF (BP MOD)                        41 %         52-72     Apical 2D Dimensions  ----------------------------------------------------------------------   RV Basal Diastolic  Dimension  5.21 cm     2.50-4.10   LA Volume Index (BP A-L)        53.97 ml/m2   16.00-34.00   RA Area (4C)                     21.80 cm2       <=18.00  M-mode Measurements  ----------------------------------------------------------------------  Name                                 Value        Normal  ----------------------------------------------------------------------    M-Mode  ----------------------------------------------------------------------  TAPSE                              1.80 cm        >=1.60  LVOT/Aortic Valve Doppler Measurements  ----------------------------------------------------------------------  Name                                 Value        Normal  ----------------------------------------------------------------------    LVOT Doppler  ----------------------------------------------------------------------  LVOT Peak Velocity                0.88 m/s                   AoV Doppler  ----------------------------------------------------------------------  AV Peak Velocity                  2.76 m/s                 AV Peak Gradient                   30 mmHg                  AV Mean Gradient                   20 mmHg           <20   AV Area Index (Cont Eq Vel)     0.7 cm2/m2                 AV Area (Cont Eq VTI)             1.14 cm2        >=3.00   AV Area Index (Cont Eq VTI)     0.62 cm2/m2                 AV V1/V2 Ratio                        0.32  RVOT/Pulmonic Valve Doppler Measurements  ----------------------------------------------------------------------  Name                                 Value        Normal  ----------------------------------------------------------------------    PV Doppler  ----------------------------------------------------------------------  PV Peak Velocity                  1.25 m/s  Mitral Valve Measurements  ----------------------------------------------------------------------  Name  Value        Normal  ----------------------------------------------------------------------    MV Doppler  ----------------------------------------------------------------------  MV E Peak Velocity                0.62 m/s                 MV A Peak Velocity                0.67 m/s                 MV E/A                                0.93                 MV Mean Gradient                 2.00 mmHg                 MV Pressure Half Time               109 ms                   MV Annular TDI  ----------------------------------------------------------------------  MV Septal e' Velocity             0.06 m/s        >=0.07   MV E/e' (Septal)                     10.40        <=8.00   MV Lateral e' Velocity            0.08 m/s        >=0.10   MV E/e' (Lateral)                     7.73  Tricuspid Valve Measurements  ----------------------------------------------------------------------  Name                                 Value        Normal  ----------------------------------------------------------------------    TV Regurgitation Doppler  ----------------------------------------------------------------------  TR Peak Velocity                  3.19  m/s        <=2.80   RA Pressure                         8 mmHg           <=3   RV Systolic Pressure               49 mmHg           <36  Aorta / Venous Measurements  ----------------------------------------------------------------------  Name                                 Value        Normal  ----------------------------------------------------------------------    IVC/SVC  ----------------------------------------------------------------------  IVC Diameter (Exp 2D)              2.64 cm        <=2.10      Report Signatures  Finalized by [name]  MD on 08/26/2022 02:02 PM  Preliminary by Debbora Lacrosse  RCS on 08/26/2022 09:37 AM          Results for orders placed or performed during the hospital encounter of 06/18/22   CT Head WO Contrast    Narrative    HISTORY: Unwitnessed fall. Posttraumatic headache and weakness. Patient is  on anticoagulation.    COMPARISON: Outside Brain MRI on 09/03/2020.    TECHNIQUE: CT of the head performed without intravenous contrast. The  following dose reduction techniques were utilized: automated exposure  control and/or adjustment of the mA and/or KV according to patient size,  and the use of an iterative reconstruction technique.    CONTRAST: None.    FINDINGS:  There is no acute hemorrhage. The ventricles and subarachnoid spaces are  proportionately enlarged reflecting volume loss.  The gray-white matter  differentiation is preserved. There is a small chronic lacunar infarct in  right cerebellum. There is moderate periventricular hypodensity most  consistent with chronic small vessel ischemic change. There is no mass  effect or midline shift. The basal cisterns are patent. Intracranial  vascular calcifications are present.    No suspicious or aggressive osseous lesion is identified. The imaged  paranasal sinuses and mastoids are clear.         Impression       1.No CT evidence of acute intracranial hemorrhage, herniation, or  hydrocephalus.   2.Moderate chronic small vessel  ischemic changes.      Nelya Ebadirad  06/18/2022 9:37 AM       Please be sure to review these imaging studies with your primary medical provider.             Home Health Discharge Information    Your doctor has ordered Skilled Nursing, Physical Therapy, and Occupational Therapy in-home service(s) for you while you recuperate at home, to assist you in the transition from hospital to home.    The agency that you or your representative chose to provide the service:  Name of Home Health Agency Placement: Other (comment box) ([facility] [phone]) [phone])]      The above services were set up by:  Hoover Brunette, RN (Home Health Liaison)   Phone: [phone]      IF YOU HAVE NOT HEARD FROM YOUR HOME HEALTH AGENCY WITHIN 24-48 HOURS AFTER DISCHARGE PLEASE CALL YOUR AGENCY TO ARRANGE A TIME FOR YOUR FIRST VISIT. FOR ANY SCHEDULING CONCERNS OR QUESTIONS RELATED TO HOME HEALTH, SUCH AS TIME OR DATE PLEASE CONTACT YOUR HOME HEALTH AGENCY AT THE NUMBER LISTED ABOVE.

## 2022-08-27 NOTE — Progress Notes (Signed)
Pt left unit via wheelchair with transport to pt entrance with son. All belongings taken with pt. IV removed, cath intact.     Lavonna Rua, RN

## 2022-08-27 NOTE — Plan of Care (Addendum)
NURSING SHIFT NOTE     Patient: Rick [name]  Day: 3      SHIFT EVENTS     Shift Narrative/Significant Events (PRN med administration, fall, RRT, etc.):     VSS. Pt denies SOB. Mild pain in neck, resolved by positioning. Trio rounding completed. Peri care completed. Pt orientation fluctuates- hx of dementia. No significant events on shift    Safety and fall precautions remain in place. Purposeful rounding completed.          ASSESSMENT     Changes in assessment from patient's baseline this shift:    Neuro: No  CV: No  Pulm: No  Peripheral Vascular: No  HEENT: No  GI: No  BM during shift: No, Last BM: Last BM Date: 08/24/22  GU: No   Integ: No  MS: No    Pain: None  Pain Interventions: None  Medications Utilized:     Mobility: PMP Activity: Step 4 - Dangle at Bedside of Distance Walked (ft) (Step 6,7): 2 Feet           Lines     Patient Lines/Drains/Airways Status       Active Lines, Drains and Airways       Name Placement date Placement time Site Days    Peripheral IV 08/24/22 20 G Anterior;Distal;Left Upper Arm 08/24/22  2300  Upper Arm  2    External Urinary Catheter 08/26/22  1000  --  1                         VITAL SIGNS     Vitals:    08/27/22 1228   BP: 162/70   Pulse: 60   Resp: 18   Temp: 97.5 F (36.4 C)   SpO2: 98%       Temp  Min: 97.2 F (36.2 C)  Max: 97.7 F (36.5 C)  Pulse  Min: 31  Max: 60  Resp  Min: 16  Max: 18  BP  Min: 104/42  Max: 162/70  SpO2  Min: 95 %  Max: 100 %      Intake/Output Summary (Last 24 hours) at 08/27/2022 1311  Last data filed at 08/27/2022 1000  Gross per 24 hour   Intake 1490 ml   Output 2400 ml   Net -910 ml              CARE PLAN       Problem: Compromised Moisture  Goal: Moisture level Interventions  Outcome: Progressing  Flowsheets (Taken 08/27/2022 0805)  Moisture level Interventions: Moisture wicking products, Moisture barrier cream     Problem: Day of Admission - Heart Failure  Goal: Heart Failure Admission  Outcome: Progressing  Flowsheets (Taken 08/27/2022  0805)  Heart Failure Admission:   Standing Weight on admission, if unable to stand zero the bed and use the bed scale   Strict Intake/Output   Initiate education with patient and caregiver using CHF Warning Zones and Educational Videos ([employer] or [employer])   Fluid restriction   Oxygen as needed   Assess for swelling/edema and document   Vital signs and telemetry per policy     Problem: Everyday - Heart Failure  Goal: Mobility/Activity is Maintained at Optimal Level for Patient  Outcome: Progressing  Flowsheets (Taken 08/27/2022 0805)  Mobility/Activity is Maintained at Optimal Level for Patient:   Increase mobility as tolerated/progressive mobility protocol   Assess for changes in respiratory status, level of consciousness and/or development  of fatigue   Reposition patient every 2 hours and as needed unless able to reposition self   Maintain SCD's as Ordered   Perform active/passive ROM   Consult with Physical Therapy and/or Occupational Therapy  Goal: Teaching-Using CHF Warning Zones and Educational Videos  Outcome: Progressing  Flowsheets (Taken 08/27/2022 0805)  Teaching-Using CHF Warning Zones and Educational Videos:   Signs & Symptoms of CHF   Medications   Document in the Education Tab in EPIC with Teach-back   Fluid Restriction if appropriate   Sodium Restriction   Daily Standing Weights & record   CHF Warning Zones and when to call for help     Problem: Hemodynamic Status: Cardiac  Goal: Stable vital signs and fluid balance  Outcome: Progressing  Flowsheets (Taken 08/27/2022 0805)  Stable vital signs and fluid balance:   Assess signs and symptoms associated with cardiac rhythm changes   Monitor lab values     Problem: Ineffective Gas Exchange  Goal: Effective breathing pattern  Outcome: Progressing  Flowsheets (Taken 08/27/2022 0805)  Effective breathing pattern:   Maintain CO2 level per LIP order   Teach/reinforce use of ordered respiratory interventions (ie. CPAP, BiPAP, Incentive Spirometer, Acapella)    Monitor end tidal CO2 level per LIP order     Problem: Compromised Sensory Perception  Goal: Sensory Perception Interventions  Outcome: Progressing  Flowsheets (Taken 08/27/2022 0805)  Sensory Perception Interventions: Offload heels, Pad bony prominences, Reposition q 2hrs/turn Clock, Q2 hour skin assessment under devices if present     Problem: Compromised Friction/Shear  Goal: Friction and Shear Interventions  Outcome: Progressing  Flowsheets (Taken 08/27/2022 0805)  Friction and Shear Interventions: Pad bony prominences, Off load heels, HOB 30 degrees or less unless contraindicated, Consider: TAP seated positioning, Heel foams

## 2022-08-28 NOTE — Progress Notes (Signed)
Name: Mamie Pecore    ### Patient Details  Date of Birth: 03-Dec-1924  MRN: [id number]    ### Encounter Details  Arrival Date: 08/24/2022 09:54 PM EDT  Discharge Date: 08/27/2022 03:57 PM EDT  Encounter ID: [id number] TCM7/12/2022   3:57:00PM    ### Related interaction  [place] - TCM V2 Post Discharge Outreach (Post Discharge TCM V2 Outreach 1) ([email or web address])    ### Questions     Question 1   Additional Questions   Do you have any additional questions or additional social needs that you would like to discuss with a member of your care team? Press 1 for No, Press 2 for Yes.    Yes has questions (Issue Panel: Post Discharge - TCM)    ### Required Interventions and Feedback     Call Status         Comments::     Reached son at [phone] (edited by NG on 08/28/2022 09:50 AM EDT)     Obtaining Rx - Action List         No Interventions Needed:     Yes (edited by NG on 08/28/2022 09:53 AM EDT)     Follow-Up Appointment  Help - Actions Taken         Provided Appt Information:     Yes (edited by NG on 08/28/2022 09:54 AM EDT)    Comments:     8/1- Cardiologist, 7/24Labs, 09/16/22- Hematology/Oncology. (edited by NG on 08/28/2022 09:54 AM EDT)     General Status          General Status - Issues:     Related to original diagnosis (edited by NG on 08/28/2022 09:50 AM EDT)    Comments:     Pt. was Hospitalized for AKI/Bradycardia/Has HX of CHF. Son stated pt. was sleepy, but at baseline. CHF Red Flag symptoms were reviewed, and son denied; sob, swelling, weight gain, dizziness, cough. (edited by NG on 08/28/2022 09:52 AM EDT)     General Status - Actions Taken         Reviewed what to do if problem arises:     Yes (edited by NG on 08/28/2022 09:52 AM EDT)    Suggested calling PCP:     Yes (edited by NG on 08/28/2022 09:52 AM EDT)    Other (Add Details in Comments):     Yes (edited by NG on 08/28/2022 09:52 AM EDT)    Comments:     Call PCP or Specialist dependent upon  symptoms and schedule an appt. (edited by NG on 08/28/2022 09:52 AM EDT)     Home Health - Actions Taken         Other (Add Details in Comments):     Yes (edited by NG on 08/28/2022 09:54 AM EDT)    Comments:     [facility] will provide SN/PT/OT (edited by NG on 08/28/2022 09:55 AM EDT)     Discharge Instruction - Actions Taken         Reviewed Discharge Instructions:     Yes (edited by NG on 08/28/2022 09:52 AM EDT)    Reviewed What To Do if Problem Arises:     Yes (edited by NG on 08/28/2022 09:52 AM EDT)    Comments:     Discharge AVS Review completed. (edited by NG on 08/28/2022 09:53 AM EDT)     Additional Questions         Comments:     Son can call CM at any time with  any questions/concerns. (edited by NG on 08/28/2022 09:55 AM EDT)     Medication Questions - Actions List         Answered Medication Questions:     Yes (edited by NG on 08/28/2022 09:53 AM EDT)    Discussed Purpose of Medications:     Yes (edited by NG on 08/28/2022 09:53 AM EDT)    Comments::     Aricept and Namenda have been discontinued. Medication Reconciliation completed successfully. (edited by NG on 08/28/2022 09:54 AM EDT)    Julieanne Cotton, MS, [facility]  Pronouns She/Her  Ambulatory Case Manager    [facility]  [address], [address]  [place], [place] [address]  [place] [phone]  [email or web address]

## 2022-09-01 ENCOUNTER — Telehealth (INDEPENDENT_AMBULATORY_CARE_PROVIDER_SITE_OTHER): Payer: Self-pay | Admitting: Student in an Organized Health Care Education/Training Program

## 2022-09-01 NOTE — Telephone Encounter (Signed)
Son called and spoke to RN. Patient was previously admitted to hospital. Assisted son in booking post hospital visit on 09/10/22 with Dr. Arlester Marker. Son agreeable verbalized understandings is agree to plan. Son has no further question/concerns at this time.

## 2022-09-08 ENCOUNTER — Encounter (INDEPENDENT_AMBULATORY_CARE_PROVIDER_SITE_OTHER): Payer: Self-pay | Admitting: Student in an Organized Health Care Education/Training Program

## 2022-09-09 ENCOUNTER — Other Ambulatory Visit (FREE_STANDING_LABORATORY_FACILITY): Payer: Medicare Other

## 2022-09-09 DIAGNOSIS — D472 Monoclonal gammopathy: Secondary | ICD-10-CM

## 2022-09-09 LAB — LAB USE ONLY - CBC WITH DIFFERENTIAL
Absolute Basophils: 0.01 10*3/uL (ref 0.00–0.08)
Absolute Eosinophils: 0.1 10*3/uL (ref 0.00–0.44)
Absolute Immature Granulocytes: 0.01 10*3/uL (ref 0.00–0.07)
Absolute Lymphocytes: 1.41 10*3/uL (ref 0.42–3.22)
Absolute Monocytes: 0.47 10*3/uL (ref 0.21–0.85)
Absolute Neutrophils: 2.15 10*3/uL (ref 1.10–6.33)
Absolute nRBC: 0 10*3/uL (ref <=0.00)
Basophils %: 0.2 %
Eosinophils %: 2.4 %
Hematocrit: 37.4 % — ABNORMAL LOW (ref 37.6–49.6)
Hemoglobin: 12 g/dL — ABNORMAL LOW (ref 12.5–17.1)
Immature Granulocytes %: 0.2 %
Lymphocytes %: 34 %
MCH: 34 pg — ABNORMAL HIGH (ref 25.1–33.5)
MCHC: 32.1 g/dL (ref 31.5–35.8)
MCV: 105.9 fL — ABNORMAL HIGH (ref 78.0–96.0)
MPV: 12.4 fL (ref 8.9–12.5)
Monocytes %: 11.3 %
Neutrophils %: 51.9 %
Platelet Count: 97 10*3/uL — ABNORMAL LOW (ref 142–346)
Preliminary Absolute Neutrophil Count: 2.15 10*3/uL (ref 1.10–6.33)
RBC: 3.53 10*6/uL — ABNORMAL LOW (ref 4.20–5.90)
RDW: 13 % (ref 11–15)
WBC: 4.15 10*3/uL (ref 3.10–9.50)
nRBC %: 0 /100 WBC (ref <=0.0)

## 2022-09-09 LAB — COMPREHENSIVE METABOLIC PANEL
ALT: 8 U/L (ref 0–55)
AST (SGOT): 19 U/L (ref 5–41)
Albumin/Globulin Ratio: 0.9 (ref 0.9–2.2)
Albumin: 3.5 g/dL (ref 3.5–5.0)
Alkaline Phosphatase: 59 U/L (ref 37–117)
Anion Gap: 8 (ref 5.0–15.0)
BUN: 34 mg/dL — ABNORMAL HIGH (ref 9–28)
Bilirubin, Total: 0.6 mg/dL (ref 0.2–1.2)
CO2: 25 mEq/L (ref 17–29)
Calcium: 9.6 mg/dL (ref 7.9–10.2)
Chloride: 111 mEq/L (ref 99–111)
Creatinine: 2.1 mg/dL — ABNORMAL HIGH (ref 0.5–1.5)
GFR: 28.5 mL/min/{1.73_m2} — ABNORMAL LOW (ref >=60.0)
Globulin: 3.7 g/dL — ABNORMAL HIGH (ref 2.0–3.6)
Glucose: 111 mg/dL — ABNORMAL HIGH (ref 70–100)
Hemolysis Index: 4 Index
Potassium: 5.1 mEq/L (ref 3.5–5.3)
Protein, Total: 7.2 g/dL (ref 6.0–8.3)
Sodium: 144 mEq/L (ref 135–145)

## 2022-09-09 LAB — IMMUNOGLOBULINS IGG, IGA, IGM, QUANTITATIVE
Immunoglobulin A: 189 mg/dL (ref 101–645)
Immunoglobulin G: 2537 mg/dL — ABNORMAL HIGH (ref 540–1822)
Immunoglobulin M: 170 mg/dL (ref 22–293)

## 2022-09-10 ENCOUNTER — Encounter (INDEPENDENT_AMBULATORY_CARE_PROVIDER_SITE_OTHER): Payer: Self-pay | Admitting: Student in an Organized Health Care Education/Training Program

## 2022-09-10 ENCOUNTER — Ambulatory Visit (INDEPENDENT_AMBULATORY_CARE_PROVIDER_SITE_OTHER): Payer: Medicare Other | Admitting: Student in an Organized Health Care Education/Training Program

## 2022-09-10 VITALS — BP 103/47 | HR 52 | Temp 98.3°F | Wt 156.8 lb

## 2022-09-10 DIAGNOSIS — N1832 Chronic kidney disease, stage 3b: Secondary | ICD-10-CM

## 2022-09-10 DIAGNOSIS — I5022 Chronic systolic (congestive) heart failure: Secondary | ICD-10-CM

## 2022-09-10 DIAGNOSIS — F0390 Unspecified dementia without behavioral disturbance: Secondary | ICD-10-CM

## 2022-09-10 DIAGNOSIS — N179 Acute kidney failure, unspecified: Secondary | ICD-10-CM

## 2022-09-10 LAB — FREE KAPPA AND LAMBDA LIGHT CHAINS WITH RATIO, QUANTITATIVE
Kappa Free Light Chain: 5.05 mg/dL — ABNORMAL HIGH (ref 0.3300–1.94)
Kappa Lambda FLC Ratio: 1.86 — ABNORMAL HIGH (ref 0.2600–1.65)
Lambda Free Light Chain: 2.72 mg/dL — ABNORMAL HIGH (ref 0.5700–2.63)

## 2022-09-10 LAB — LAB USE ONLY - SERUM PROTEIN ELECTROPHORESIS, PATH REVIEW

## 2022-09-10 NOTE — Progress Notes (Signed)
Subjective:      Patient ID: Rick [name] is a 89 y.o. male.    Chief Complaint:  Chief Complaint   Patient presents with    Hospital Follow-up     07/08-07/11            HPI   Here for follow-up from recent hospitalization.   Inpatient facility: [facility]  Date of admission: July 8  Date of discharge: July 11  Discharge diagnosis: acute renal failure  Hospital course: Per hospitalist  "Hypotension  By the time patient arrived blood pressure was normal  Entresto resumed with holding parameter  Spironolactone and Lasix resumed on discharge  Blood pressure has been stable throughout patient's stay  Outpatient follow-up with PCP     Bradycardia  3-second pause  Intermittent Mobitz type II AV block  Cardiology evaluated and recommended EP consultation  EP evaluated and recommended for now no intervention  Heart rate dropped to the low 30s  Discussed with general cardiology, given this is less likely compatible with life without pacemaker recommended palliative team consultation  CODE STATUS changed to DNR/DNI  Discussed with the son who would not like pacemaker insertion given dementia and advanced age which is understandable and appropriate  PT OT recommended home with home health  Does not want full comfort care at this point  Memantine and donepezil discontinued because of bradycardia  Cleared for discharge from cardiology standpoint of view  Outpatient follow-up with cardiology     Acute kidney injury  Likely in setting of dehydration  Creatinine back to baseline 1.9--1.3"       Problem   Stage 3b Chronic Kidney Disease    History of myeloma/monoclonal gammopathy (IgG lambda), [place] oncology following  Recent bump in creatinine 2.1, eGFR 28.5, baseline creatinine 1.4-1.5, hypotensive, hypovolemic, avoiding NSAIDs/contrast      Lab Results   Component Value Date    CREAT 2.1 (H) 09/09/2022    BUN 34 (H) 09/09/2022    NA 144 09/09/2022    K 5.1 09/09/2022    CL 111 09/09/2022    CO2 25  09/09/2022              Chronic Systolic Chf (Congestive Heart Failure)    Last echo EF 25-30% 04/09/2021  Compliant with Entresto, spironolactone 25mg , Jardiance 10mg   Baseline weight of 146-147lbs,reports weight gain in the past few weeks    No edema/increasing DOE, hypovolemic, AKI, hold furosemide, has follow-up with cardiology next week    Wt Readings from Last 3 Encounters:   09/10/22 71.1 kg (156 lb 12.8 oz)   08/27/22 71.2 kg (156 lb 15.5 oz)   08/24/22 72.1 kg (159 lb)          Dementia Without Behavioral Disturbance    09/10/2022 - recent hospitalization for AKI, Donepezil and Memantine discontinued     Background: Treated by specialist in [place], currently lives with his son who is supportive, stable on memantine 20mg , no mood disorder/agitation, son noted more frequent episode of memory deficits in the past few months, to establish care with neurology    BP Readings from Last 3 Encounters:   05/01/20 115/60   10/09/19 149/74   08/25/19 115/80               Prior hospitalization within past 30 days: No   Prior hospitalization within past 6 months: Yes  Comorbid conditions:  See HPI  Home care: needs assistance with ADL's and home PT assisting with care  Specialist follow-up: cardiology and nephrology  Post-hospital monitoring: daily weights  Mobility status: wheelchair bound     Medication Reconciliation(required):  Home medications were reviewed and reconciled with current medication list within electronic medical record. Patient is adherent with current medication regimen.    Advanced Directives:  Has no Advanced Directive. Form provided.    Problem List[1]    Medications Taking[2]    Allergies[3]    Medical History[4]    Past Surgical History:   Procedure Laterality Date    COLONOSCOPY, DIAGNOSTIC (SCREENING)      CORONARY ARTERY BYPASS GRAFT  2003     approx year: LIMA to LAD,  SVG to OM 1 & OM 2 of circumflex CA, & SVG to RCA    FRACTURE SURGERY Right 06/2016    femur    HERNIORRHAPHY, INGUINAL Right  04/29/2021    Procedure: OPEN RIGHT HERNIORRHAPHY, INGUINAL WITH MESH;  Surgeon: Towanda Octave, MD;  Location: [facility];  Service: General;  Laterality: Right;    PERCUTANEOUS VALVULOPLASTY - AORTIC N/A 07/12/2019    Procedure: TAVR #2;  Surgeon: Arlyss Queen, MD PhD;  Location: [facility];  Service: Cardiovascular;  Laterality: N/A;  23 HR OB    REPLACEMENT, AORTIC VALVE , EDWARDS SAPIEN N/A 07/12/2019    Procedure: REPLACEMENT, AORTIC VALVE , EDWARDS SAPIEN -- TAVR Valve:   S3 29mm -- Access: RTF -- IC: Dr. Penny Pia;  Surgeon: Christene Lye, MD;  Location: [facility];  Service: Cardiothoracic;  Laterality: N/A;    RIGHT & LEFT HEART CATH POSSIBLE PCI N/A 05/26/2019    Procedure: RIGHT & LEFT HEART CATH POSSIBLE PCI;  Surgeon: Arlyss Queen, MD PhD;  Location: [facility];  Service: Cardiovascular;  Laterality: N/A;  [id number] scheduled with Maira in dr's office. OP. kr    TOTAL KNEE ARTHROPLASTY Left 2017    TOTAL KNEE ARTHROPLASTY Right 2005       Family History   Problem Relation Age of Onset    No known problems Mother     No known problems Father     No known problems Sister     No known problems Brother        Social History[5]      The following sections were reviewed this encounter by the provider:   Tobacco  Allergies  Meds  Problems  Med Hx  Surg Hx  Fam Hx          Review of Systems  See HPI  Vitals:  BP 103/47 (BP Site: Left arm, Patient Position: Sitting, Cuff Size: Medium)   Pulse (!) 52   Temp 98.3 F (36.8 C) (Tympanic)   Wt 71.1 kg (156 lb 12.8 oz)   BMI 23.84 kg/m     Objective:     Physical Exam  Constitutional:       Appearance: Normal appearance.   HENT:      Head: Normocephalic and atraumatic.      Nose: Nose normal.      Mouth/Throat:      Mouth: Mucous membranes are moist.      Pharynx: Oropharynx is clear.   Eyes:      Extraocular Movements: Extraocular movements intact.      Conjunctiva/sclera: Conjunctivae normal.      Pupils: Pupils are equal,  round, and reactive to light.   Cardiovascular:      Rate and Rhythm: Normal rate and regular rhythm.      Pulses: Normal pulses.  Heart sounds: Normal heart sounds.   Pulmonary:      Effort: Pulmonary effort is normal.      Breath sounds: Normal breath sounds.   Abdominal:      General: Abdomen is flat. Bowel sounds are normal.      Palpations: Abdomen is soft.   Musculoskeletal:         General: Normal range of motion.      Cervical back: Normal range of motion and neck supple.   Skin:     General: Skin is warm and dry.   Neurological:      Mental Status: He is alert. Mental status is at baseline.      Comments: Wheelchair-bound   Psychiatric:         Mood and Affect: Mood normal.         Behavior: Behavior normal.         Assessment:     1. Chronic systolic CHF (congestive heart failure)        2. Dementia without behavioral disturbance        3. Acute renal failure, unspecified acute renal failure type        4. Stage 3b chronic kidney disease            Chronic systolic CHF (congestive heart failure)  - Hypotensive/hypovolemic, hold furosemide, daily weights, fluid restriction, continue Entresto/spironolactone/Jardiance, follow-up with cardiology    Dementia without behavioral disturbance  - recent hospitalization for AKI, Donepezil and Memantine discontinued     Stage 3b chronic kidney disease  -Hold furosemide, avoid NSAIDs/contrast, follow-up with nephrology      Plan:   Disease/Illness Education:  Patient educated regarding symptom management, adherence to current medication regimen and current plan of care. Patient encouraged to contact us with any questions or needs.  Patient agreeable to current plan of care and verbalizes understanding of information provided.  Home Health Needs:  physical therapy  Community Service Needs:  No community services needed at this time.  Establishment of Referral Orders:  No referrals needed at this time.  Communication with Health Care Providers:  No active  communication with additional health care providers needed at this time.  [place] 90+ day Treatment Plan:  Not applicable    Transitional Care Management Coding Criteria:  1) Communication with the patient and/or family was made within 2 business days of discharge.  2) Face-to-face visit occurred within 14 days of discharge.  3) Complexity of medical decision making is high.    Return in about 3 months (around 12/11/2022).    Erasmo Score, MD                   [1]   Patient Active Problem List  Diagnosis    Memory deficits    Coronary artery disease with history of myocardial infarction without history of CABG    Vertebrogenic low back pain    Nonrheumatic aortic valve stenosis    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    Dry cough    Aortic valve stenosis, etiology of cardiac valve disease unspecified    S/P TAVR (transcatheter aortic valve replacement)    Difficulty in walking, not elsewhere classified    Dementia without behavioral disturbance    Balance disorder    Chronic systolic CHF (congestive heart failure)    Acute pulmonary embolism with acute cor pulmonale, unspecified pulmonary embolism type    Pulmonary embolism with acute cor pulmonale, unspecified chronicity, unspecified  pulmonary embolism type    Incarcerated inguinal hernia    Acute renal failure with tubular necrosis    Vascular dementia    Thrombocytopenia    Smoldering myeloma    Artificial knee joint present    Benign prostatic hyperplasia    Fracture of femur    Hallux valgus (acquired), right foot    History of total right knee replacement    Hypertension    IgG monoclonal gammopathy of uncertain significance    Loose total knee arthroplasty    Nocturia    Old myocardial infarction    Osteoarthritis of left knee    Other hammer toe(s) (acquired), left foot    Other hammer toe(s) (acquired), right foot    Other spondylosis with myelopathy, cervical region    URI with cough and congestion    Chronic  bilateral low back pain without sciatica    Combined forms of age-related cataract of right eye    Combined forms of age-related cataract of left eye    Stage 3b chronic kidney disease    AKI (acute kidney injury)    Acute kidney failure    HFrEF (heart failure with reduced ejection fraction)   [2]   Outpatient Medications Marked as Taking for the 09/10/22 encounter (Office Visit) with Erasmo Score, MD   Medication Sig Dispense Refill    acetaminophen (TYLENOL) 500 MG tablet Take 1 tablet (500 mg) by mouth as needed for Pain      docusate sodium (COLACE) 100 MG capsule Take 1 capsule (100 mg) by mouth 2 (two) times daily      empagliflozin (JARDIANCE) 10 MG tablet Take 1 tablet (10 mg) by mouth daily 90 tablet 3    furosemide (LASIX) 20 MG tablet Take 1 tablet (20 mg) by mouth daily 90 tablet 1    Multiple Vitamin (multivitamin) capsule Take 1 capsule by mouth daily      rosuvastatin (CRESTOR) 20 MG tablet       sacubitril-valsartan (ENTRESTO) 24-26 MG Tab per tablet Take 0.5 tablets by mouth 2 (two) times daily 90 tablet 3    spironolactone (ALDACTONE) 25 MG tablet Take 0.5 tablets (12.5 mg) by mouth daily 90 tablet 0    tamsulosin (FLOMAX) 0.4 MG Cap Take 1 capsule (0.4 mg) by mouth every evening 90 capsule 3    Vitamin D, Cholecalciferol, 50 MCG (2000 UT) Cap Take 2,000 Units by mouth Once every Monday, Wednesday and Friday morning     [3] No Known Allergies  [4]   Past Medical History:  Diagnosis Date    Arrhythmia 2021    RBBB    Atrial enlargement, bilateral Echo result 04/04/19    Bilateral cataracts     Son reports pt has the ability to lay flat for 45+min without CP or SOB as reported on 05/14/22 for DOS  06/04/22    CAD (coronary artery disease) 05/28/19 Cath results.    Severe 3V CAD.  Patent LIMA to LAD, patent SVG to OM 1 and OM 2 of circumflex CA, & patent SVG to RCA.    Chronic combined systolic and diastolic heart failure 310/21 OV Dr. Penny Pia    EF=30%    Dementia 04/2014    Difficulty walking 06/2016     Post surgery of lt knee, rt femur/ uses walker and wheelchair for long distances prn    Disorder of prostate     Dyslipidemia 310/21 OV Dr. Penny Pia    Dyspnea on exertion 02/2018    occas  per son report on 05/14/22, doing better    Hearing loss     mild . does not like to wear/ bilat    HTN (hypertension) 310/21 OV Dr. Penny Pia    Son reports unaware of htn dx as reported on 3/114/24    Low back pain     muscular    Primary cardiomyopathy 11/2017    Urinary incontinence     occas  2-3 monthly at qhs or in am    VTE (venous thromboembolism) Feb 2023    In lungs, combined with bronchitis   [5]   Social History  Tobacco Use    Smoking status: Never    Smokeless tobacco: Never   Vaping Use    Vaping status: Never Used   Substance Use Topics    Alcohol use: Never    Drug use: Never

## 2022-09-10 NOTE — Assessment & Plan Note (Signed)
-   Hypotensive/hypovolemic, hold furosemide, daily weights, fluid restriction, continue Entresto/spironolactone/Jardiance, follow-up with cardiology

## 2022-09-10 NOTE — Progress Notes (Signed)
Have you seen any specialists/other providers since your last visit with us?    No    Arm preference verified?   Yes, no preference    Health Maintenance Due   Topic Date Due    Medicare Annual Wellness Visit  Never done    Shingrix Vaccine 50+ (1) Never done    COVID-19 Vaccine (4 - 2023-24 season) 10/17/2021

## 2022-09-10 NOTE — Assessment & Plan Note (Signed)
-   recent hospitalization for AKI, Donepezil and Memantine discontinued

## 2022-09-10 NOTE — Assessment & Plan Note (Signed)
-  Hold furosemide, avoid NSAIDs/contrast, follow-up with nephrology

## 2022-09-15 ENCOUNTER — Encounter (INDEPENDENT_AMBULATORY_CARE_PROVIDER_SITE_OTHER): Payer: Self-pay | Admitting: Student in an Organized Health Care Education/Training Program

## 2022-09-16 ENCOUNTER — Ambulatory Visit: Payer: Medicare Other | Attending: Hematology & Oncology | Admitting: Hematology & Oncology

## 2022-09-16 ENCOUNTER — Encounter: Payer: Self-pay | Admitting: Hematology & Oncology

## 2022-09-16 ENCOUNTER — Other Ambulatory Visit: Payer: Self-pay | Admitting: Hematology & Oncology

## 2022-09-16 DIAGNOSIS — Z952 Presence of prosthetic heart valve: Secondary | ICD-10-CM | POA: Insufficient documentation

## 2022-09-16 DIAGNOSIS — I2609 Other pulmonary embolism with acute cor pulmonale: Secondary | ICD-10-CM | POA: Insufficient documentation

## 2022-09-16 DIAGNOSIS — D696 Thrombocytopenia, unspecified: Secondary | ICD-10-CM | POA: Insufficient documentation

## 2022-09-16 DIAGNOSIS — D472 Monoclonal gammopathy: Secondary | ICD-10-CM | POA: Insufficient documentation

## 2022-09-16 DIAGNOSIS — N179 Acute kidney failure, unspecified: Secondary | ICD-10-CM | POA: Insufficient documentation

## 2022-09-16 DIAGNOSIS — D649 Anemia, unspecified: Secondary | ICD-10-CM | POA: Insufficient documentation

## 2022-09-16 NOTE — Progress Notes (Signed)
HISTORY OF PRESENT ILLNESS  Rick [name] is 89 y.o. male who comes in  for follow up. He has a history of atherosclerosis s/p CABG, aortic stenosis s/p TAVR, vascular dementia, acute renal insufficiency, low back pain and balance disorder. He presented to me for a new finding of bilateral PE on 04/08/21. CTA showed segmental and subsegmental pulmonary emboli in the right lower lobe. There was also mild straightening of the interventricular septum as well as reflux of contrast into the intrahepatic IVC, suggesting right heart strain. Bilateral dopplers did not show evidence of clot. Echo showed severely reduced EF of 25-30%. CT of the abdomen and pelvis in 04/2021 showed small bowel obstruction with transition point at site of new right-sided inguinal hernia. He was started on Eliquis for his clot.      Labs show anemia with hemoglobin of 11.8 and thrombocytopenia with platelets of 70-80. He has mildly elevated creatinine of 1.2-1.5.      He was found to have a monoclonal gammopathy (IgG lambda). MRI testing (06/2021) as well as urine testing and bone marrow biopsy (07/31/21). He does have 8-10 % clonal plasma cells in the marrow with t(4;14) which is high risk. MRI does not show lytic bone lesions however and hemoglobin has improved. There was no evidence of renal failure or hypercalcemia.     Since last visit he was supposed to have cataract surgery but was delayed due with recent admission. He was admitted to the hospital from 7/8-7/11/24 for acute kidney failure and hypotension. He was also found to have bradycardia and 3-second pause. Donepezil and memantine discontinued because of bradycardia . Eliquis was also discontinued with discharge as well. Renal function improved with hydration back to baseline.      Today Rick [name] presents in a wheelchair with his son.  He denies fever, chills, night sweats, or sweats. Back pain is stable and muscular. He denies bleeding but has had several near falls  recently.         Allergies[1]  Current Medications[2]  Medical History[3]  Past Surgical History:   Procedure Laterality Date    COLONOSCOPY, DIAGNOSTIC (SCREENING)      CORONARY ARTERY BYPASS GRAFT  2003     approx year: LIMA to LAD,  SVG to OM 1 & OM 2 of circumflex CA, & SVG to RCA    FRACTURE SURGERY Right 06/2016    femur    HERNIORRHAPHY, INGUINAL Right 04/29/2021    Procedure: OPEN RIGHT HERNIORRHAPHY, INGUINAL WITH MESH;  Surgeon: Towanda Octave, MD;  Location: [facility];  Service: General;  Laterality: Right;    PERCUTANEOUS VALVULOPLASTY - AORTIC N/A 07/12/2019    Procedure: TAVR #2;  Surgeon: Arlyss Queen, MD PhD;  Location: [facility];  Service: Cardiovascular;  Laterality: N/A;  23 HR OB    REPLACEMENT, AORTIC VALVE , EDWARDS SAPIEN N/A 07/12/2019    Procedure: REPLACEMENT, AORTIC VALVE , EDWARDS SAPIEN -- TAVR Valve:   S3 29mm -- Access: RTF -- IC: Dr. Penny Pia;  Surgeon: Christene Lye, MD;  Location: [facility] OR;  Service: Cardiothoracic;  Laterality: N/A;    RIGHT & LEFT HEART CATH POSSIBLE PCI N/A 05/26/2019    Procedure: RIGHT & LEFT HEART CATH POSSIBLE PCI;  Surgeon: Arlyss Queen, MD PhD;  Location: [facility];  Service: Cardiovascular;  Laterality: N/A;  [id number] scheduled with Maira in dr's office. OP. kr    TOTAL KNEE ARTHROPLASTY Left 2017    TOTAL KNEE ARTHROPLASTY  Right 2005     Family History   Problem Relation Age of Onset    No known problems Mother     No known problems Father     No known problems Sister     No known problems Brother      Social History[4]    REVIEW OF SYSTEMS  Review of Systems   Constitutional:  Negative for chills and fever.   Respiratory:  Negative for shortness of breath.    Cardiovascular:  Negative for chest pain.   Gastrointestinal:  Negative for abdominal pain.   Musculoskeletal:  Positive for back pain (unchanged).   Endo/Heme/Allergies:  Does not bruise/bleed easily.        Objective:     Vitals:    09/16/22 1342   BP: 105/50    Pulse: (!) 50   Temp: 98.2 F (36.8 C)   SpO2: 98%      Physical Exam  Vitals reviewed.   Constitutional:       Appearance: He is ill-appearing.      Comments: In a wheelchair   Eyes:      General: No scleral icterus.     Conjunctiva/sclera: Conjunctivae normal.   Pulmonary:      Effort: Pulmonary effort is normal.   Musculoskeletal:         General: No swelling.   Neurological:      Mental Status: He is alert and oriented to person, place, and time.   Psychiatric:         Mood and Affect: Mood normal.         Behavior: Behavior normal.        Assessment & Plan:   Rick [name] is a 89 year old male who presents for bilateral PE. He also has anemia with thrombocytopenia.      In workup for anemia he was found to have monoclonal gammopathy (IgG lambda) with M-spike of 1 gm/dL. 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      Bilateral PE. Rick [name] has had several near falls recently. Given real risk of bleeding especially with thrombocytopenia will continue to hold Eliquis for now. He and his son are aware of signs/symptoms of thrombosis (leg swelling, acute chest pain/pressure) and need for immediate medical evaluation should these occur.     2. Anemia. Rick [name] has monoclonal gammopathy (IgG lambda). Hemoglobin better but renal function again poor with creatinine recently up to 2.1 but likely due to ongoing diuresis.      3. Smoldering myeloma. I reviewed myeloma labs which are mixed. M-spike is stable at 1.4 gm/dL and IgG stable with lambda light chains (as well as kappa light chains) just slightly higher. No clear anemia or hypercalcemia. Back pain stable. Also unclear that Rick [name] could tolerate chemotherapy with frailty and age 44.      He will continue with observation for now with repeat labs in 4 months time but will let me know if back pain worsens etc.     4. Thrombocytopenia. This is stable without evidence of bleeding.      5. Disposition. All of Mr.  [name] questions today were answered to his apparent satisfaction. He knows to call with any further questions or concerns.     Time spent 31 min          [1] No Known Allergies  [2]   Current Outpatient Medications   Medication Sig Dispense Refill  acetaminophen (TYLENOL) 500 MG tablet Take 1 tablet (500 mg) by mouth as needed for Pain      docusate sodium (COLACE) 100 MG capsule Take 1 capsule (100 mg) by mouth 2 (two) times daily      empagliflozin (JARDIANCE) 10 MG tablet Take 1 tablet (10 mg) by mouth daily 90 tablet 3    furosemide (LASIX) 20 MG tablet Take 1 tablet (20 mg) by mouth daily (Patient taking differently: Take 1 tablet (20 mg) by mouth daily Taking as needed) 90 tablet 1    Multiple Vitamin (multivitamin) capsule Take 1 capsule by mouth daily      rosuvastatin (CRESTOR) 20 MG tablet       sacubitril-valsartan (ENTRESTO) 24-26 MG Tab per tablet Take 0.5 tablets by mouth 2 (two) times daily 90 tablet 3    spironolactone (ALDACTONE) 25 MG tablet Take 0.5 tablets (12.5 mg) by mouth daily 90 tablet 0    tamsulosin (FLOMAX) 0.4 MG Cap Take 1 capsule (0.4 mg) by mouth every evening 90 capsule 3    Vitamin D, Cholecalciferol, 50 MCG (2000 UT) Cap Take 2,000 Units by mouth Once every Monday, Wednesday and Friday morning       No current facility-administered medications for this visit.   [3]   Past Medical History:  Diagnosis Date    Arrhythmia 2021    RBBB    Atrial enlargement, bilateral Echo result 04/04/19    Bilateral cataracts     Son reports pt has the ability to lay flat for 45+min without CP or SOB as reported on 05/14/22 for DOS  06/04/22    CAD (coronary artery disease) 05/28/19 Cath results.    Severe 3V CAD.  Patent LIMA to LAD, patent SVG to OM 1 and OM 2 of circumflex CA, & patent SVG to RCA.    Chronic combined systolic and diastolic heart failure 310/21 OV Dr. Penny Pia    EF=30%    Dementia 04/2014    Difficulty walking 06/2016    Post surgery of lt knee, rt femur/ uses walker and  wheelchair for long distances prn    Disorder of prostate     Dyslipidemia 310/21 OV Dr. Penny Pia    Dyspnea on exertion 02/2018    occas per son report on 05/14/22, doing better    Hearing loss     mild . does not like to wear/ bilat    HTN (hypertension) 310/21 OV Dr. Penny Pia    Son reports unaware of htn dx as reported on 3/114/24    Low back pain     muscular    Primary cardiomyopathy 11/2017    Urinary incontinence     occas  2-3 monthly at qhs or in am    VTE (venous thromboembolism) Feb 2023    In lungs, combined with bronchitis   [4]   Social History  Socioeconomic History    Marital status: Widowed   Tobacco Use    Smoking status: Never    Smokeless tobacco: Never   Vaping Use    Vaping status: Never Used   Substance and Sexual Activity    Alcohol use: Never    Drug use: Never     Social Determinants of Health     Financial Resource Strain: Low Risk  (08/19/2022)    Overall Financial Resource Strain (CARDIA)     Difficulty of Paying Living Expenses: Not hard at all   Food Insecurity: No Food Insecurity (08/24/2022)    Hunger Vital Sign  Worried About Programme researcher, broadcasting/film/video in the Last Year: Never true     Ran Out of Food in the Last Year: Never true   Transportation Needs: No Transportation Needs (08/24/2022)    PRAPARE - Therapist, art (Medical): No     Lack of Transportation (Non-Medical): No   Physical Activity: Inactive (08/19/2022)    Exercise Vital Sign     Days of Exercise per Week: 0 days     Minutes of Exercise per Session: 0 min   Stress: No Stress Concern Present (08/19/2022)    [employer] of Occupational Health - Occupational Stress Questionnaire     Feeling of Stress : Not at all   Social Connections: Unknown (08/19/2022)    Social Connection and Isolation Panel [NHANES]     Frequency of Communication with Friends and Family: Once a week     Frequency of Social Gatherings with Friends and Family: Patient declined     Attends Religious Services: Patient declined     Automotive engineer or Organizations: No     Attends Banker Meetings: Never     Marital Status: Widowed   Intimate Partner Violence: Not At Risk (08/24/2022)    Humiliation, Afraid, Rape, and Kick questionnaire     Fear of Current or Ex-Partner: No     Emotionally Abused: No     Physically Abused: No     Sexually Abused: No   Housing Stability: Low Risk  (08/24/2022)    Housing Stability Vital Sign     Unable to Pay for Housing in the Last Year: No     Number of Places Lived in the Last Year: 1     Unstable Housing in the Last Year: No

## 2022-09-17 ENCOUNTER — Encounter (INDEPENDENT_AMBULATORY_CARE_PROVIDER_SITE_OTHER): Payer: Self-pay | Admitting: Cardiovascular Disease

## 2022-09-17 ENCOUNTER — Other Ambulatory Visit (FREE_STANDING_LABORATORY_FACILITY): Payer: Medicare Other

## 2022-09-17 ENCOUNTER — Ambulatory Visit (INDEPENDENT_AMBULATORY_CARE_PROVIDER_SITE_OTHER): Payer: Medicare Other | Admitting: Cardiovascular Disease

## 2022-09-17 ENCOUNTER — Ambulatory Visit: Admit: 2022-09-17 | Payer: Medicare Other | Admitting: Ophthalmology

## 2022-09-17 DIAGNOSIS — I5020 Unspecified systolic (congestive) heart failure: Secondary | ICD-10-CM

## 2022-09-17 LAB — COMPREHENSIVE METABOLIC PANEL
ALT: 9 U/L (ref 0–55)
AST (SGOT): 17 U/L (ref 5–41)
Albumin/Globulin Ratio: 1 (ref 0.9–2.2)
Albumin: 3.5 g/dL (ref 3.5–5.0)
Alkaline Phosphatase: 56 U/L (ref 37–117)
Anion Gap: 9 (ref 5.0–15.0)
BUN: 31 mg/dL — ABNORMAL HIGH (ref 9–28)
Bilirubin, Total: 0.7 mg/dL (ref 0.2–1.2)
CO2: 20 mEq/L (ref 17–29)
Calcium: 9.7 mg/dL (ref 7.9–10.2)
Chloride: 112 mEq/L — ABNORMAL HIGH (ref 99–111)
Creatinine: 1.6 mg/dL — ABNORMAL HIGH (ref 0.5–1.5)
GFR: 39.4 mL/min/{1.73_m2} — ABNORMAL LOW (ref >=60.0)
Globulin: 3.5 g/dL (ref 2.0–3.6)
Glucose: 137 mg/dL — ABNORMAL HIGH (ref 70–100)
Hemolysis Index: 11 Index
Potassium: 5.3 mEq/L (ref 3.5–5.3)
Protein, Total: 7 g/dL (ref 6.0–8.3)
Sodium: 141 mEq/L (ref 135–145)

## 2022-09-17 SURGERY — EXTRACTION, CATARACT, EXTRACAPSULAR, INSERTION INTRAOCULAR LENS
Anesthesia: Anesthesia MAC / Sedation | Site: Eye | Laterality: Right

## 2022-09-17 NOTE — Progress Notes (Signed)
[facility]    Chief Complaint   Patient presents with    Hospital Follow-up     ED follow up          Assessment and Plan   89 y.o. male with history of severe AS s/p TAVR, severe TR, moderate PR, dementia, monoclonal gammopathy, CAD, mixed CM, HTN, who presented with hyperkalemia and AKI, noted to have pause in this setting.  Cardiology evaluated pt and EP Shelly Bombard) saw patient as well.  Here for post discharge f/u.    Current meds are Jardiance 10 mg QD, Lasix 20 mg PRN, Crestor 20 mg QD, Entresto 24-26 mg BID, and 12.5 mg Aldactone.    1. HFrEF (heart failure with reduced ejection fraction)      Repeat CMP today  He had Cr of 2.1 8 days ago.  Would want to avoid risk of further AKI and hyperK  Hold aldactone until further notice    F/u with Dr. Melina Schools            History of Present Illness     89 y.o. male with prior medical history for HFrEF (LVEF 30%), coronary artery disease, TAVR, hypertension, hyperlipidemia, monoclonal gammopathy and dementia who was sent to ED on 08/24/22 with hypotension (90/50).  He was hyperkalemic with AKI on presentation.  His heart rates was down to 30s overnight with 3-second pause for which a cardiac consultation was requested.      Primary diagnosis: Acute kidney injury     Bradycardia and intermittent Mobitz I  -Bradycardia down to 29  -multiple pauses ~2.0, longest pause of 2.9 seconds  -intermittent Mobitz II  -EP consultation yesterday: do not believe SA/AV block will be life threatening. EP discussed with son who appreciated quality of life at his advanced age and did not opt for ICD/PPM.      PVCs/NSVT  -high volume  -unable to tolerate medications due to SA and AV blocks (see above)  -4 brief runs of NSVT overnight, longest episode was 7 beats     Acute kidney injury  -likely in setting of dehydration?  -BUN 56 with Cr 2.5 on arrival ->BUN 45 Cr 1.9 -> BUN 25 Cr 1.3   -NT-proBNP  548     HFrEF  -LVEF 30%   -euvolemic fluid   -Daily weight 147-155 lbs.   -CXR  without any acute process     Cardiomyopathy  -suspect mixed ischemic and non-ischemic, no further workup per family due to advanced age, medically managing  -Guideline Directed Medical Therapy              Beta Blocker: avoiding due to bradycardia               ACEi/ARB/ARNI: 1/2 tablet Entresto bid              ZOX:[id number] 12.5 mg daily (at home, currently on hold due to hyperkalemia and AKI on presentation at hospital)              SGLT2i: Jardiance 10 mg               Diuretic:     Thrombocytopenia  -Platelets 73     Coronary artery disease  -rosuvastatin 5 mg daily      Goals of care  -Would appreciate palliative consultation to clarify goals of care prior to discharge in light of NSVT, pauses and inability to tolerate pharmaceutical intervention and choice of non-invasive support due to advanced age and co-morbidities.  Past Medical History     Medical History[1]    Past Surgical History     Past Surgical History:   Procedure Laterality Date    COLONOSCOPY, DIAGNOSTIC (SCREENING)      CORONARY ARTERY BYPASS GRAFT  2003     approx year: LIMA to LAD,  SVG to OM 1 & OM 2 of circumflex CA, & SVG to RCA    FRACTURE SURGERY Right 06/2016    femur    HERNIORRHAPHY, INGUINAL Right 04/29/2021    Procedure: OPEN RIGHT HERNIORRHAPHY, INGUINAL WITH MESH;  Surgeon: Towanda Octave, MD;  Location: [facility];  Service: General;  Laterality: Right;    PERCUTANEOUS VALVULOPLASTY - AORTIC N/A 07/12/2019    Procedure: TAVR #2;  Surgeon: Arlyss Queen, MD PhD;  Location: [facility];  Service: Cardiovascular;  Laterality: N/A;  23 HR OB    REPLACEMENT, AORTIC VALVE , EDWARDS SAPIEN N/A 07/12/2019    Procedure: REPLACEMENT, AORTIC VALVE , EDWARDS SAPIEN -- TAVR Valve:   S3 29mm -- Access: RTF -- IC: Dr. Penny Pia;  Surgeon: Christene Lye, MD;  Location: [facility];  Service: Cardiothoracic;  Laterality: N/A;    RIGHT & LEFT HEART CATH POSSIBLE PCI N/A 05/26/2019    Procedure: RIGHT & LEFT HEART CATH  POSSIBLE PCI;  Surgeon: Arlyss Queen, MD PhD;  Location: [facility];  Service: Cardiovascular;  Laterality: N/A;  [id number] scheduled with Maira in dr's office. OP. kr    TOTAL KNEE ARTHROPLASTY Left 2017    TOTAL KNEE ARTHROPLASTY Right 2005       Family History     Family History   Problem Relation Age of Onset    No known problems Mother     No known problems Father     No known problems Sister     No known problems Brother        Social History     Social History[2]    Allergies     Allergies[3]    Medications     Current Medications[4]      Review of Systems     Constitutional: Negative for fevers and chills  Skin: No rash or lesions  Respiratory: Negative for cough, wheezing, or hemoptysis  Cardiovascular: as per HPI  Gastrointestinal: Negative for abdominal pain, nausea, vomiting and diarrhea  Musculoskeletal:  No arthritic symptoms  Genitourinary: Negative for dysuria  Otherwise 10 point review of systems is negative.      Physical Exam     Vitals:    09/17/22 1116   BP: 116/53   Pulse: (!) 50       Body mass index is 23.99 kg/m.    General:  Patient appears their stated age, well-nourished.  Alert and in no apparent distress.  Eyes: No conjunctivitis, no purulent discharge, no lid lag  ENT:  Hearing grossly intact, Nares patent bilaterally, Lips moist, color appropriate for race.  Respiratory: Clear to auscultation and percussion throughout. Respiratory effort unlabored, chest expansion symmetric.    Cardio: Regular rate and rhythm. Normal S1/S2 No carotid bruits or thrills, no JVD.  Extremities: warm, pulses 2+, no peripheral edema  GI: Soft, nondistended, nontender.  No guarding or rebound.  Skin: Color appropriate for race, Skin warm, dry, and intact  Psychiatric: Good insight and judgment, oriented to person, place, and time    Labs     Lipid Panel   Cholesterol   Date/Time Value Ref Range Status   01/01/2021 03:00  PM 172 0 - 199 mg/dL Final     Triglycerides   Date/Time Value Ref Range Status    01/01/2021 03:00 PM 67 34 - 149 mg/dL Final     HDL   Date/Time Value Ref Range Status   01/01/2021 03:00 PM 73 40 - 9,999 mg/dL Final     Comment:     An HDL cholesterol <40 mg/dL is low and constitutes a  coronary heart disease risk factor, and HDL-C>59 mg/dL is  a negative risk factor for CHD.  Ref: American Heart Association; Circulation 2004       CBC   Lab Results   Component Value Date    WBC 4.15 09/09/2022    HGB 12.0 (L) 09/09/2022    HCT 37.4 (L) 09/09/2022    MCV 105.9 (H) 09/09/2022    PLT 97 (L) 09/09/2022      BMP  Lab Results   Component Value Date    CO2 25 09/09/2022    CO2 25 06/18/2022    BUN 34 (H) 09/09/2022    BUN 43.0 (H) 06/18/2022     INR   Lab Results   Component Value Date    INR 1.2 (H) 07/10/2019    INR 1.1 05/17/2019        EKG   I have reviewed and interpreted the EKG.          I spent 40 mins with the patient, >50% of the time was spent on education and counseling.      Renard Hamper, MD, MD, MS    [facility]         [1]   Past Medical History:  Diagnosis Date    Arrhythmia 2021    RBBB    Atrial enlargement, bilateral Echo result 04/04/19    Bilateral cataracts     Son reports pt has the ability to lay flat for 45+min without CP or SOB as reported on 05/14/22 for DOS  06/04/22    CAD (coronary artery disease) 05/28/19 Cath results.    Severe 3V CAD.  Patent LIMA to LAD, patent SVG to OM 1 and OM 2 of circumflex CA, & patent SVG to RCA.    Chronic combined systolic and diastolic heart failure 310/21 OV Dr. Penny Pia    EF=30%    Dementia 04/2014    Difficulty walking 06/2016    Post surgery of lt knee, rt femur/ uses walker and wheelchair for long distances prn    Disorder of prostate     Dyslipidemia 310/21 OV Dr. Penny Pia    Dyspnea on exertion 02/2018    occas per son report on 05/14/22, doing better    Hearing loss     mild . does not like to wear/ bilat    HTN (hypertension) 310/21 OV Dr. Penny Pia    Son reports unaware of htn dx as reported on 3/114/24     Low back pain     muscular    Primary cardiomyopathy 11/2017    Urinary incontinence     occas  2-3 monthly at qhs or in am    VTE (venous thromboembolism) Feb 2023    In lungs, combined with bronchitis   [2]   Social History  Socioeconomic History    Marital status: Widowed   Tobacco Use    Smoking status: Never    Smokeless tobacco: Never   Vaping Use    Vaping status: Never Used   Substance and Sexual Activity  Alcohol use: Never    Drug use: Never     Social Determinants of Health     Financial Resource Strain: Low Risk  (08/19/2022)    Overall Financial Resource Strain (CARDIA)     Difficulty of Paying Living Expenses: Not hard at all   Food Insecurity: No Food Insecurity (08/24/2022)    Hunger Vital Sign     Worried About Running Out of Food in the Last Year: Never true     Ran Out of Food in the Last Year: Never true   Transportation Needs: No Transportation Needs (08/24/2022)    PRAPARE - Therapist, art (Medical): No     Lack of Transportation (Non-Medical): No   Physical Activity: Inactive (08/19/2022)    Exercise Vital Sign     Days of Exercise per Week: 0 days     Minutes of Exercise per Session: 0 min   Stress: No Stress Concern Present (08/19/2022)    [employer] of Occupational Health - Occupational Stress Questionnaire     Feeling of Stress : Not at all   Social Connections: Unknown (08/19/2022)    Social Connection and Isolation Panel [NHANES]     Frequency of Communication with Friends and Family: Once a week     Frequency of Social Gatherings with Friends and Family: Patient declined     Attends Religious Services: Patient declined     Database administrator or Organizations: No     Attends Banker Meetings: Never     Marital Status: Widowed   Intimate Partner Violence: Not At Risk (08/24/2022)    Humiliation, Afraid, Rape, and Kick questionnaire     Fear of Current or Ex-Partner: No     Emotionally Abused: No     Physically Abused: No     Sexually Abused: No    Housing Stability: Low Risk  (08/24/2022)    Housing Stability Vital Sign     Unable to Pay for Housing in the Last Year: No     Number of Places Lived in the Last Year: 1     Unstable Housing in the Last Year: No   [3] No Known Allergies  [4]   Current Outpatient Medications   Medication Sig Dispense Refill    acetaminophen (TYLENOL) 500 MG tablet Take 1 tablet (500 mg) by mouth as needed for Pain      docusate sodium (COLACE) 100 MG capsule Take 1 capsule (100 mg) by mouth 2 (two) times daily      empagliflozin (JARDIANCE) 10 MG tablet Take 1 tablet (10 mg) by mouth daily 90 tablet 3    furosemide (LASIX) 20 MG tablet Take 1 tablet (20 mg) by mouth daily (Patient taking differently: Take 1 tablet (20 mg) by mouth daily Taking as needed) 90 tablet 1    Multiple Vitamin (multivitamin) capsule Take 1 capsule by mouth daily      rosuvastatin (CRESTOR) 20 MG tablet       sacubitril-valsartan (ENTRESTO) 24-26 MG Tab per tablet Take 0.5 tablets by mouth 2 (two) times daily 90 tablet 3    spironolactone (ALDACTONE) 25 MG tablet Take 0.5 tablets (12.5 mg) by mouth daily 90 tablet 0    tamsulosin (FLOMAX) 0.4 MG Cap Take 1 capsule (0.4 mg) by mouth every evening 90 capsule 3    Vitamin D, Cholecalciferol, 50 MCG (2000 UT) Cap Take 2,000 Units by mouth Once every Monday, Wednesday and Friday morning  No current facility-administered medications for this visit.

## 2022-09-21 ENCOUNTER — Encounter (INDEPENDENT_AMBULATORY_CARE_PROVIDER_SITE_OTHER): Payer: Self-pay | Admitting: Student in an Organized Health Care Education/Training Program

## 2022-09-21 ENCOUNTER — Other Ambulatory Visit (INDEPENDENT_AMBULATORY_CARE_PROVIDER_SITE_OTHER): Payer: Self-pay | Admitting: Student in an Organized Health Care Education/Training Program

## 2022-09-23 ENCOUNTER — Emergency Department: Payer: Medicare Other

## 2022-09-23 ENCOUNTER — Inpatient Hospital Stay
Admission: EM | Admit: 2022-09-23 | Discharge: 2022-09-29 | DRG: 534 | Disposition: A | Discharge status: Skilled Nursing Facility | Payer: Medicare Other | Attending: Internal Medicine | Admitting: Internal Medicine

## 2022-09-23 DIAGNOSIS — R64 Cachexia: Secondary | ICD-10-CM | POA: Diagnosis present

## 2022-09-23 DIAGNOSIS — I441 Atrioventricular block, second degree: Secondary | ICD-10-CM | POA: Diagnosis present

## 2022-09-23 DIAGNOSIS — I429 Cardiomyopathy, unspecified: Secondary | ICD-10-CM | POA: Diagnosis present

## 2022-09-23 DIAGNOSIS — R338 Other retention of urine: Secondary | ICD-10-CM | POA: Diagnosis present

## 2022-09-23 DIAGNOSIS — Z86711 Personal history of pulmonary embolism: Secondary | ICD-10-CM

## 2022-09-23 DIAGNOSIS — F0150 Vascular dementia without behavioral disturbance: Secondary | ICD-10-CM | POA: Diagnosis present

## 2022-09-23 DIAGNOSIS — E785 Hyperlipidemia, unspecified: Secondary | ICD-10-CM | POA: Diagnosis present

## 2022-09-23 DIAGNOSIS — R627 Adult failure to thrive: Secondary | ICD-10-CM | POA: Diagnosis present

## 2022-09-23 DIAGNOSIS — R001 Bradycardia, unspecified: Secondary | ICD-10-CM | POA: Diagnosis present

## 2022-09-23 DIAGNOSIS — Z79899 Other long term (current) drug therapy: Secondary | ICD-10-CM

## 2022-09-23 DIAGNOSIS — Z96652 Presence of left artificial knee joint: Secondary | ICD-10-CM | POA: Diagnosis present

## 2022-09-23 DIAGNOSIS — D638 Anemia in other chronic diseases classified elsewhere: Secondary | ICD-10-CM | POA: Diagnosis present

## 2022-09-23 DIAGNOSIS — W19XXXA Unspecified fall, initial encounter: Secondary | ICD-10-CM | POA: Diagnosis present

## 2022-09-23 DIAGNOSIS — I130 Hypertensive heart and chronic kidney disease with heart failure and stage 1 through stage 4 chronic kidney disease, or unspecified chronic kidney disease: Secondary | ICD-10-CM | POA: Diagnosis present

## 2022-09-23 DIAGNOSIS — I472 Ventricular tachycardia, unspecified: Secondary | ICD-10-CM | POA: Diagnosis present

## 2022-09-23 DIAGNOSIS — N1832 Chronic kidney disease, stage 3b: Secondary | ICD-10-CM | POA: Diagnosis present

## 2022-09-23 DIAGNOSIS — Z6823 Body mass index (BMI) 23.0-23.9, adult: Secondary | ICD-10-CM

## 2022-09-23 DIAGNOSIS — I2510 Atherosclerotic heart disease of native coronary artery without angina pectoris: Secondary | ICD-10-CM | POA: Diagnosis present

## 2022-09-23 DIAGNOSIS — I5042 Chronic combined systolic (congestive) and diastolic (congestive) heart failure: Secondary | ICD-10-CM | POA: Diagnosis present

## 2022-09-23 DIAGNOSIS — I252 Old myocardial infarction: Secondary | ICD-10-CM

## 2022-09-23 DIAGNOSIS — S72401A Unspecified fracture of lower end of right femur, initial encounter for closed fracture: Principal | ICD-10-CM | POA: Diagnosis present

## 2022-09-23 DIAGNOSIS — D696 Thrombocytopenia, unspecified: Secondary | ICD-10-CM | POA: Diagnosis present

## 2022-09-23 DIAGNOSIS — M9711XA Periprosthetic fracture around internal prosthetic right knee joint, initial encounter: Secondary | ICD-10-CM | POA: Diagnosis present

## 2022-09-23 DIAGNOSIS — Z952 Presence of prosthetic heart valve: Secondary | ICD-10-CM

## 2022-09-23 DIAGNOSIS — N179 Acute kidney failure, unspecified: Secondary | ICD-10-CM | POA: Diagnosis present

## 2022-09-23 DIAGNOSIS — N401 Enlarged prostate with lower urinary tract symptoms: Secondary | ICD-10-CM | POA: Diagnosis present

## 2022-09-23 DIAGNOSIS — Z7984 Long term (current) use of oral hypoglycemic drugs: Secondary | ICD-10-CM

## 2022-09-23 DIAGNOSIS — S7291XA Unspecified fracture of right femur, initial encounter for closed fracture: Principal | ICD-10-CM | POA: Diagnosis present

## 2022-09-23 DIAGNOSIS — Z951 Presence of aortocoronary bypass graft: Secondary | ICD-10-CM

## 2022-09-23 MED ORDER — POTASSIUM CHLORIDE 10 MEQ/100ML IV SOLN
10.0000 meq | INTRAVENOUS | Status: DC | PRN
Start: 2022-09-23 — End: 2022-09-29

## 2022-09-23 MED ORDER — SALINE SPRAY 0.65 % NA SOLN
2.0000 | NASAL | Status: DC | PRN
Start: 2022-09-23 — End: 2022-09-29

## 2022-09-23 MED ORDER — MAGNESIUM SULFATE IN D5W 1-5 GM/100ML-% IV SOLN
1.0000 g | INTRAVENOUS | Status: DC | PRN
Start: 2022-09-23 — End: 2022-09-29

## 2022-09-23 MED ORDER — POTASSIUM CHLORIDE CRYS ER 10 MEQ PO TBCR
0.0000 meq | EXTENDED_RELEASE_TABLET | ORAL | Status: DC | PRN
Start: 2022-09-23 — End: 2022-09-29

## 2022-09-23 MED ORDER — BENZONATATE 100 MG PO CAPS
100.0000 mg | ORAL_CAPSULE | Freq: Three times a day (TID) | ORAL | Status: DC | PRN
Start: 2022-09-23 — End: 2022-09-29

## 2022-09-23 MED ORDER — EMPAGLIFLOZIN 10 MG PO TABS
10.0000 mg | ORAL_TABLET | Freq: Every day | ORAL | Status: DC
Start: 2022-09-23 — End: 2022-09-29
  Administered 2022-09-23 – 2022-09-29 (×7): 10 mg via ORAL
  Filled 2022-09-23 (×7): qty 1

## 2022-09-23 MED ORDER — SPIRONOLACTONE 25 MG PO TABS
12.5000 mg | ORAL_TABLET | Freq: Every day | ORAL | Status: DC
Start: 2022-09-23 — End: 2022-09-26
  Administered 2022-09-23 – 2022-09-25 (×2): 12.5 mg via ORAL
  Filled 2022-09-23 (×3): qty 1

## 2022-09-23 MED ORDER — DEXTROSE 50 % IV SOLN
12.5000 g | INTRAVENOUS | Status: DC | PRN
Start: 2022-09-23 — End: 2022-09-29

## 2022-09-23 MED ORDER — ACETAMINOPHEN 500 MG PO TABS
500.0000 mg | ORAL_TABLET | ORAL | Status: DC | PRN
Start: 2022-09-23 — End: 2022-09-29
  Administered 2022-09-27: 500 mg via ORAL
  Filled 2022-09-23: qty 1

## 2022-09-23 MED ORDER — MELATONIN 3 MG PO TABS
3.0000 mg | ORAL_TABLET | Freq: Every evening | ORAL | Status: DC | PRN
Start: 2022-09-23 — End: 2022-09-29
  Administered 2022-09-24: 3 mg via ORAL
  Filled 2022-09-23: qty 1

## 2022-09-23 MED ORDER — BENZOCAINE-MENTHOL MT LOZG (WRAP)
1.0000 | LOZENGE | OROMUCOSAL | Status: DC | PRN
Start: 2022-09-23 — End: 2022-09-29

## 2022-09-23 MED ORDER — GLUCAGON 1 MG IJ SOLR (WRAP)
1.0000 mg | INTRAMUSCULAR | Status: DC | PRN
Start: 2022-09-23 — End: 2022-09-29

## 2022-09-23 MED ORDER — CARBOXYMETHYLCELLULOSE SODIUM 0.5 % OP SOLN
1.0000 [drp] | Freq: Three times a day (TID) | OPHTHALMIC | Status: DC | PRN
Start: 2022-09-23 — End: 2022-09-29

## 2022-09-23 MED ORDER — NALOXONE HCL 0.4 MG/ML IJ SOLN (WRAP)
0.2000 mg | INTRAMUSCULAR | Status: DC | PRN
Start: 2022-09-23 — End: 2022-09-29

## 2022-09-23 MED ORDER — TAMSULOSIN HCL 0.4 MG PO CAPS
0.4000 mg | ORAL_CAPSULE | Freq: Every evening | ORAL | Status: DC
Start: 2022-09-23 — End: 2022-09-29
  Administered 2022-09-23 – 2022-09-28 (×6): 0.4 mg via ORAL
  Filled 2022-09-23 (×6): qty 1

## 2022-09-23 MED ORDER — DEXTROSE 10 % IV BOLUS
12.5000 g | INTRAVENOUS | Status: DC | PRN
Start: 2022-09-23 — End: 2022-09-29

## 2022-09-23 MED ORDER — ACETAMINOPHEN 325 MG PO TABS
650.0000 mg | ORAL_TABLET | Freq: Three times a day (TID) | ORAL | Status: DC | PRN
Start: 2022-09-23 — End: 2022-09-29
  Filled 2022-09-23: qty 2

## 2022-09-23 MED ORDER — POTASSIUM & SODIUM PHOSPHATES 280-160-250 MG PO PACK
2.0000 | PACK | ORAL | Status: DC | PRN
Start: 2022-09-23 — End: 2022-09-29

## 2022-09-23 MED ORDER — GLUCOSE 40 % PO GEL (WRAP)
15.0000 g | ORAL | Status: DC | PRN
Start: 2022-09-23 — End: 2022-09-29

## 2022-09-23 MED ORDER — ENOXAPARIN SODIUM 30 MG/0.3ML IJ SOSY
30.0000 mg | PREFILLED_SYRINGE | Freq: Every day | INTRAMUSCULAR | Status: DC
Start: 2022-09-23 — End: 2022-09-29
  Administered 2022-09-23 – 2022-09-29 (×5): 30 mg via SUBCUTANEOUS
  Filled 2022-09-23 (×6): qty 0.3

## 2022-09-23 MED ORDER — ROSUVASTATIN CALCIUM 10 MG PO TABS
10.0000 mg | ORAL_TABLET | Freq: Every evening | ORAL | Status: DC
Start: 2022-09-23 — End: 2022-09-29
  Administered 2022-09-23 – 2022-09-28 (×6): 10 mg via ORAL
  Filled 2022-09-23 (×6): qty 1

## 2022-09-23 MED ORDER — DOCUSATE SODIUM 100 MG PO CAPS
100.0000 mg | ORAL_CAPSULE | Freq: Two times a day (BID) | ORAL | Status: DC
Start: 2022-09-23 — End: 2022-09-26
  Administered 2022-09-23 – 2022-09-25 (×5): 100 mg via ORAL
  Filled 2022-09-23 (×4): qty 1

## 2022-09-23 MED ORDER — SACUBITRIL-VALSARTAN 24-26 MG PO TABS
0.5000 | ORAL_TABLET | Freq: Two times a day (BID) | ORAL | Status: DC
Start: 2022-09-23 — End: 2022-09-26
  Administered 2022-09-23 – 2022-09-25 (×4): 0.5 via ORAL
  Filled 2022-09-23 (×5): qty 1

## 2022-09-23 NOTE — Plan of Care (Addendum)
NURSING SHIFT NOTE     Patient: Rick [name]  Day: 0      SHIFT EVENTS     Shift Narrative/Significant Events (PRN med administration, fall, RRT, etc.):     -Pt a&ox2 on RA  -Pt admitted to unit   -Room and unit orientation given  -4 eyes skin assessment done  -Pt is total care  -Scheduled meds given and tolerated by pt  -Call bell within reach   Safety and fall precautions remain in place. Purposeful rounding completed.          ASSESSMENT     Changes in assessment from patient's baseline this shift:    Neuro: No  CV: No  Pulm: No  Peripheral Vascular: No  HEENT: No  GI: No  BM during shift: No, Last BM:  09/22/2022  GU: No   Integ: No  MS: No      Mobility: PMP Activity: Step 3 - Bed Mobility of Distance Walked (ft) (Step 6,7): 0 Feet           Lines     Patient Lines/Drains/Airways Status       Active Lines, Drains and Airways       None                         VITAL SIGNS     Vitals:    09/23/22 1741   BP: 96/64   Pulse: (!) 42   Resp: 12   Temp: 97.3 F (36.3 C)   SpO2: 98%       Temp  Min: 97.3 F (36.3 C)  Max: 98 F (36.7 C)  Pulse  Min: 42  Max: 63  Resp  Min: 12  Max: 20  BP  Min: 96/64  Max: 154/67  SpO2  Min: 97 %  Max: 99 %    No intake or output data in the 24 hours ending 09/23/22 1805               CARE PLAN     Problem: Moderate/High Fall Risk Score >5  Goal: Patient will remain free of falls  Outcome: Progressing  Flowsheets (Taken 09/23/2022 1730)  High (Greater than 13):   HIGH-Apply yellow "Fall Risk" arm band   HIGH-Bed alarm on at all times while patient in bed     Problem: Pain interferes with ability to perform ADL  Goal: Pain at adequate level as identified by patient  Outcome: Progressing  Flowsheets (Taken 09/23/2022 1804)  Pain at adequate level as identified by patient:   Identify patient comfort function goal   Assess for risk of opioid induced respiratory depression, including snoring/sleep apnea. Alert healthcare team of risk factors identified.   Assess pain on admission,  during daily assessment and/or before any "as needed" intervention(s)   Reassess pain within 30-60 minutes of any procedure/intervention, per Pain Assessment, Intervention, Reassessment (AIR) Cycle     Problem: Side Effects from Pain Analgesia  Goal: Patient will experience minimal side effects of analgesic therapy  Outcome: Progressing  Flowsheets (Taken 09/23/2022 1804)  Patient will experience minimal side effects of analgesic therapy:   Monitor/assess patient's respiratory status (RR depth, effort, breath sounds)   Assess for changes in cognitive function   Prevent/manage side effects per LIP orders (i.e. nausea, vomiting, pruritus, constipation, urinary retention, etc.)   Evaluate for opioid-induced sedation with appropriate assessment tool (i.e. POSS)     Problem: Compromised Sensory Perception  Goal: Sensory  Perception Interventions  Outcome: Progressing  Flowsheets (Taken 09/23/2022 1730)  Sensory Perception Interventions: Offload heels, Pad bony prominences, Reposition q 2hrs/turn Clock, Q2 hour skin assessment under devices if present     Problem: Compromised Moisture  Goal: Moisture level Interventions  Outcome: Progressing  Flowsheets (Taken 09/23/2022 1730)  Moisture level Interventions: Moisture wicking products, Moisture barrier cream     Problem: Compromised Activity/Mobility  Goal: Activity/Mobility Interventions  Outcome: Progressing  Flowsheets (Taken 09/23/2022 1730)  Activity/Mobility Interventions: Pad bony prominences, TAP Seated positioning system when OOB, Promote PMP, Reposition q 2 hrs / turn clock, Offload heels     Problem: Compromised Nutrition  Goal: Nutrition Interventions  Outcome: Progressing  Flowsheets (Taken 09/23/2022 1730)  Nutrition Interventions: Discuss nutrition at Rounds, I&Os, Document % meal eaten, Daily weights     Problem: Compromised Friction/Shear  Goal: Friction and Shear Interventions  Outcome: Progressing  Flowsheets (Taken 09/23/2022 1730)  Friction and Shear Interventions:  Pad bony prominences, Off load heels, HOB 30 degrees or less unless contraindicated, Consider: TAP seated positioning, Heel foams

## 2022-09-23 NOTE — H&P (Signed)
[facility]      Patient: Rick [name]  Date: 09/23/2022   DOB: 02-09-1925  Date of Admission: 09/23/2022   MRN: [id number]  Attending: Dorothyann Gibbs, MD         Chief Complaint   Patient presents with    Knee Pain    Fall        History Gathered From: Patient, chart    HISTORY AND PHYSICAL     Rick [name] is a 89 y.o. male with a PMHx of dementia, MGUS, HFrEF with EF 30%, CAD, HTN, HLD, intermittent Mobitz type II AV block who presented with right knee pain.    Patient presents in the setting of right knee pain after recent fall several days ago.  He normally ambulates with a walker.  He has been having extreme difficulty walking and bearing weight with his walker since the fall.  Family initially believed that his symptoms might improve with time but decided to bring him to the ED when he remained unable to walk.  Patient reports no pain at rest however any movement of the knee causes significant pain.    In the ED, patient intermittently bradycardic with vital signs otherwise within normal limits.  No labs were obtained in the ED today.  CT right knee without contrast showed nondisplaced hairline fracture of the right distal femoral metadiaphysis, healed fracture of the femoral diaphysis status post ORIF, chronic fragmentation of the superior medial pole of the patella.    Patient was recently admitted to [facility] 08/24/2022 to 08/27/2022 for for hypotension.  During this hospitalization he was found to be bradycardic with a 3-second pause and intermittent Mobitz type II AV block.  He was evaluated by cardiology who recommended EP consultation.  EP evaluated the patient and recommended no new intervention at this point.  It was discussed with the patient's son that pacemaker placement be avoided given patient's dementia and advanced age.    Medical History[1]    Past Surgical History:   Procedure Laterality Date    COLONOSCOPY, DIAGNOSTIC (SCREENING)      CORONARY ARTERY  BYPASS GRAFT  2003     approx year: LIMA to LAD,  SVG to OM 1 & OM 2 of circumflex CA, & SVG to RCA    FRACTURE SURGERY Right 06/2016    femur    HERNIORRHAPHY, INGUINAL Right 04/29/2021    Procedure: OPEN RIGHT HERNIORRHAPHY, INGUINAL WITH MESH;  Surgeon: Towanda Octave, MD;  Location: [facility];  Service: General;  Laterality: Right;    PERCUTANEOUS VALVULOPLASTY - AORTIC N/A 07/12/2019    Procedure: TAVR #2;  Surgeon: Arlyss Queen, MD PhD;  Location: [facility];  Service: Cardiovascular;  Laterality: N/A;  23 HR OB    REPLACEMENT, AORTIC VALVE , EDWARDS SAPIEN N/A 07/12/2019    Procedure: REPLACEMENT, AORTIC VALVE , EDWARDS SAPIEN -- TAVR Valve:   S3 29mm -- Access: RTF -- IC: Dr. Penny Pia;  Surgeon: Christene Lye, MD;  Location: [facility];  Service: Cardiothoracic;  Laterality: N/A;    RIGHT & LEFT HEART CATH POSSIBLE PCI N/A 05/26/2019    Procedure: RIGHT & LEFT HEART CATH POSSIBLE PCI;  Surgeon: Arlyss Queen, MD PhD;  Location: [facility];  Service: Cardiovascular;  Laterality: N/A;  [id number] scheduled with Maira in dr's office. OP. kr    TOTAL KNEE ARTHROPLASTY Left 2017    TOTAL KNEE ARTHROPLASTY Right 2005       Prior  to Admission medications    Medication Sig Start Date End Date Taking? Authorizing Provider   acetaminophen (TYLENOL) 500 MG tablet Take 1 tablet (500 mg) by mouth as needed for Pain   Yes [provider]   empagliflozin (JARDIANCE) 10 MG tablet Take 1 tablet (10 mg) by mouth daily 03/30/22  Yes Erasmo Score, MD   furosemide (LASIX) 20 MG tablet Take 1 tablet (20 mg) by mouth daily  Patient taking differently: Take 1 tablet (20 mg) by mouth daily Taking as needed 05/29/22  Yes Erasmo Score, MD   Multiple Vitamin (multivitamin) capsule Take 1 capsule by mouth daily   Yes [provider]   rosuvastatin (CRESTOR) 20 MG tablet  07/15/22  Yes [provider]   sacubitril-valsartan (ENTRESTO) 24-26 MG Tab per tablet Take 0.5 tablets by mouth 2 (two)  times daily 07/22/22  Yes Erasmo Score, MD   spironolactone (ALDACTONE) 25 MG tablet Take 0.5 tablets (12.5 mg) by mouth daily 06/17/22  Yes Fortino Sic, MD   tamsulosin (FLOMAX) 0.4 MG Cap Take 1 capsule (0.4 mg) by mouth every evening 06/29/22  Yes Erasmo Score, MD   Vitamin D, Cholecalciferol, 50 MCG (2000 UT) Cap Take 2,000 Units by mouth Once every Monday, Wednesday and Friday morning   Yes [provider]   docusate sodium (COLACE) 100 MG capsule Take 1 capsule (100 mg) by mouth 2 (two) times daily    [provider]       Allergies[2]    Family History   Problem Relation Age of Onset    No known problems Mother     No known problems Father     No known problems Sister     No known problems Brother        Social History[3]    REVIEW OF SYSTEMS   Positive for: None  Negative for: Chest pain, shortness of breath, N/V/D, subjective fevers  All ROS completed and otherwise negative.    PHYSICAL EXAM     Vital Signs (most recent): BP 125/57   Pulse (!) 43   Temp 97.3 F (36.3 C) (Oral)   Resp 16   Ht 1.727 m (5\' 8" )   Wt 71.7 kg (158 lb)   SpO2 98%   BMI 24.02 kg/m   Constitutional: No apparent distress. Patient speaks freely in full sentences.   HEENT: NC/AT, PERRL, no scleral icterus or conjunctival pallor, no nasal discharge, MMM, oropharynx without erythema or exudate  Neck: trachea midline, supple, no cervical or supraclavicular lymphadenopathy or masses  Cardiovascular: RRR, normal S1 S2, no murmurs, gallops, palpable thrills, no JVD, Non-displaced PMI.  Respiratory: Normal rate. No retractions or increased work of breathing. Clear to auscultation and percussion bilaterally.  Gastrointestinal: +BS, non-distended, soft, non-tender, no rebound or guarding, no hepatosplenomegaly  Genitourinary: no suprapubic or costovertebral angle tenderness  Musculoskeletal: ROM and motor strength grossly normal. No clubbing, edema, or cyanosis. DP and radial pulses 2+ and symmetric.  Neurologic: EOMI, CN  2-12 grossly intact. no gross motor or sensory deficits  Psychiatric: AAOx3, affect and mood appropriate. The patient is alert, interactive, appropriate.    LABS & IMAGING     No results found for this or any previous visit (from the past 24 hour(s)).    MICROBIOLOGY:  Blood Culture: None  Urine Culture: None    CARDIAC:  EKG Interpretation (upon my review): None    Markers:        EMERGENCY DEPARTMENT COURSE:  Orders Placed  This Encounter   Procedures    Lower extremity products    Knee immobilizer    CT Knee Right without Contrast    Comprehensive Metabolic Panel    CBC with Differential (Order)    Adult diet Therapeutic/ Modified; Solid; Regular (IDDSI level 7); Thin (IDDSI level 0)    Vital signs    Pulse Oximetry    Progressive Mobility Protocol    Notify physician    I/O    Height    Weight    Skin assessment    Notify physician (Critical Blood Glucose Value)    Notify physician (Communication: Document Abnormal Blood Glucose)    POCT order (PRN hypoglycemia)    Adult Hypoglycemia Treatment Algorithm    Education: Activity    Education: Disease Process & Condition    Education: Pain Management    Education: Falls Risk    Education: Smoking Cessation    Non weight bearing    NO CPR - Support OK    Inpatient consult to Orthopedic Surgery    OT eval and treat    PT evaluate and treat    Saline lock IV    Admit to Observation       ASSESSMENT & PLAN     Rick [name] is a 89 y.o. male admitted with Closed fracture of right femur, unspecified fracture morphology, unspecified portion of femur, initial encounter.    Closed fracture of right femur  CT right knee without contrast showed nondisplaced hairline fracture of the right distal femoral metadiaphysis, healed fracture of the femoral diaphysis status post ORIF, chronic fragmentation of the superior medial pole of the patella.  -PT OT  -Non weightbearing  -Place right knee immobilizer  -Consult to orthopedic surgery       Bradycardia  Intermittent  Mobitz type II AV block  During last admission, patient was evaluated by cardiology and received EP evaluation.  EP recommended no new intervention at this time.  Pacemaker implantation was discussed with patient's son but he decided against it given patient's advanced age and dementia.  Memantine and donepezil were discontinued because of bradycardia.  Palliative care evaluation was recommended.  -AM consult to palliative care  -No need for cardiology consult this admission     Chronic heart failure with reduced EF  -Continue Entresto and Jardiance     Dementia  -Donepezil and Memantine discontinued during previous admission     Chronic thrombocytopenia  -Monitor daily CBC     Coronary artery disease  -Continue statin     History of PE  -Patient is not on anticoagulation               Nutrition: Heart healthy diet    DVT/VTE Prophylaxis:   Current Facility-Administered Medications (Includes Only Anticoagulants, Misc. Hematological)   Medication Dose Route Last Admin    enoxaparin (LOVENOX) syringe 30 mg  30 mg Subcutaneous 30 mg at 09/23/22 1829       Code Status: NO CPR - SUPPORT OK    Patient Class: OBSERVATION.    Anticipated medical stability for discharge: 24 - 48 Hours      Signed,  Dorothyann Gibbs, MD    09/23/2022 8:14 PM  Time Elapsed: 45 minutes             [1]   Past Medical History:  Diagnosis Date    Arrhythmia 2021    RBBB    Atrial enlargement, bilateral Echo result 04/04/19    Bilateral cataracts  Son reports pt has the ability to lay flat for 45+min without CP or SOB as reported on 05/14/22 for DOS  06/04/22    CAD (coronary artery disease) 05/28/19 Cath results.    Severe 3V CAD.  Patent LIMA to LAD, patent SVG to OM 1 and OM 2 of circumflex CA, & patent SVG to RCA.    Chronic combined systolic and diastolic heart failure 310/21 OV Dr. Penny Pia    EF=30%    Dementia 04/2014    Difficulty walking 06/2016    Post surgery of lt knee, rt femur/ uses walker and wheelchair for long distances prn     Disorder of prostate     Dyslipidemia 310/21 OV Dr. Penny Pia    Dyspnea on exertion 02/2018    occas per son report on 05/14/22, doing better    Hearing loss     mild . does not like to wear/ bilat    HTN (hypertension) 310/21 OV Dr. Penny Pia    Son reports unaware of htn dx as reported on 3/114/24    Low back pain     muscular    Primary cardiomyopathy 11/2017    Urinary incontinence     occas  2-3 monthly at qhs or in am    VTE (venous thromboembolism) Feb 2023    In lungs, combined with bronchitis   [2] No Known Allergies  [3]   Social History  Tobacco Use    Smoking status: Never    Smokeless tobacco: Never   Vaping Use    Vaping status: Never Used   Substance Use Topics    Alcohol use: Never    Drug use: Never

## 2022-09-23 NOTE — Progress Notes (Addendum)
4 eyes in 4 hours pressure injury assessment note:      Completed with: Addy CT  Unit & Time admitted: Unit 23 @530  pm             Bony Prominences: Check appropriate box; if wound is present enter wound assessment in LDA     Occiput:                 [x] WNL  []  Wound present  Face:                     [x] WNL  []  Wound present  Ears:                      [x] WNL  []  Wound present  Spine:                    [x] WNL  []  Wound present  Shoulders:             [x] WNL  []  Wound present  Elbows:                  [x] WNL  []  Wound present  Sacrum/coccyx:     [x] WNL  []  Wound present  Ischial Tuberosity:  [x] WNL  []  Wound present  Trochanter/Hip:      [x] WNL  []  Wound present  Knees:                   [x] WNL  []  Wound present  Ankles:                   [x] WNL  []  Wound present  Heels:                    [x] WNL  []  Wound present  Other pressure areas:  []  Wound location       Device related: []  Device name:         LDA completed if wound present: yes/no  Consult WOCN if necessary    Other skin related issues, ie tears, rash, etc, document in Integumentary flowsheet     Pt has surgical scar on chest and bilateral knee surgical scars.

## 2022-09-23 NOTE — ED Triage Notes (Signed)
Patient brought in by his son, whom he lives with for right knee pain s/p "knee giving out and fell onto it leaning against cabinet" denies hitting head. Son found him on his side with his shoulder against bathroom cabinet.

## 2022-09-23 NOTE — ED to IP RN Note (Signed)
 [facility]  ED NURSING NOTE FOR THE RECEIVING INPATIENT NURSE   ED NURSE [phone]   [facility] [phone]   ED CHARGE RN [phone]   ADMISSION INFORMATION   Rick [name] is a 89 y.o. male admitted with an ED diagnosis of:    1. Closed fracture of right femur, unspecified fracture morphology, unspecified portion of femur, initial encounter         Isolation: None   Allergies: Patient has no known allergies.   Holding Orders confirmed? Yes   Belongings Documented? Yes   Home medications sent to pharmacy confirmed? No   NURSING CARE   Patient Comes From:   Mental Status: Home/Family Care  alert and oriented   ADL: Needs assistance with ADLs   Ambulation: 1 person assist   Pertinent Information  and Safety Concerns:     Broset Violence Risk Level: Low Fall Risk unable to bear weight due to femur fx. Needs knee immobilizer to stabilize limb     CT / NIH   CT Head ordered on this patient?  No   NIH/Dysphagia assessment done prior to admission? No   VITAL SIGNS (at the time of this note)      Vitals:    09/23/22 1341   BP:    Pulse:    Resp:    Temp: 97.3 F (36.3 C)   SpO2:      Pain Score: 0-No pain (09/23/22 1109)

## 2022-09-23 NOTE — ED Provider Notes (Signed)
EMERGENCY DEPARTMENT HISTORY AND PHYSICAL EXAM      Patient Name: Rick [name]  Age: 89 y.o. male  Encounter Date:  09/23/2022  Department:[facility]  Patient Room: A2327/A2327.B  PCP: Erasmo Score, MD     History of Presenting Illness     Chief Complaint   Patient presents with    Knee Pain    Fall       History Provided By: Patient and Patient's Son    History obtained from a source other than the patient: Yes. Why: To obtain information in addition to that relayed by the patient.    Rick [name] is a 89 y.o. male who  has a past medical history of Arrhythmia (2021), Atrial enlargement, bilateral (Echo result 04/04/19), Bilateral cataracts, CAD (coronary artery disease) (05/28/19 Cath results.), Chronic combined systolic and diastolic heart failure (310/21 OV Dr. Penny Pia), Dementia (04/2014), Difficulty walking (06/2016), Disorder of prostate, Dyslipidemia (310/21 OV Dr. Penny Pia), Dyspnea on exertion (02/2018), Hearing loss, HTN (hypertension) (310/21 OV Dr. Penny Pia), Low back pain, Primary cardiomyopathy (11/2017), Urinary incontinence, and VTE (venous thromboembolism) (Feb 2023). presents to ED for evaluation of right knee pain in setting of recent fall few days ago.  Normally ambulates with a walker.  Has been having extreme difficulty walking and bearing weight with his walker since the fall.  Family thought symptoms may improve with just time however given still not able to walk, brought patient in for further evaluation.  Patient reports no pain at rest however with any movement of the knee has a lot of pain.       I reviewed patient's last ED visit, clinic visit or admission/discharge summary, as well as associated recent EKGs, lab or imaging results, if applicable.     Review of Systems     Please refer to HPI for pertinent positives and negatives.     Physical Exam   BP 125/57   Pulse (!) 43   Temp 97.3 F (36.3 C) (Oral)   Resp 16   Ht 5\' 8"  (1.727 m)   Wt 71.7 kg    SpO2 98%   BMI 24.02 kg/m     Physical Exam  Constitutional: Alert, well appearing and in no distress.   Head: Normocephalic and atraumatic.   Mouth/Throat: Oropharynx is clear and moist.   Eyes: Conjunctivae normal. Pupils are equal and round.   Cardiovascular: Normal rate, regular rhythm  Pulmonary/Chest: Effort normal and breath sounds normal.   Abdominal: Non tender. Soft. Non distended. No guarding  Musculoskeletal: No peripheral edema, gross deformity. No pain at rest but with movement of RLE, having significant paint. Neurovascularly intact. Soft compartments.  Neurological: Patient is alert and GCS score is 15.  No cranial nerve deficit.  Sensation intact. Strength 5/5 in all extremities.  No dysmetria on finger-to-nose, heel-to-shin.  Ambulates with steady gait.  Skin: Skin is warm and dry.  Psychiatric: Affect normal.      Medical Decision Making   I am the first provider for this patient.    I reviewed the vital signs, available nursing notes, allergies, past medical history, past surgical history, family history and social history. If pertinent, they are mentioned in HPI.     Personal Protective Equipment (PPE)  Gloves, surgical hat and surgical mask.    Provider Notes/Summary:     89 y.o. male with RLE pain in setting of fall few days prior.  Hx of old right knee replacement almost 20 years ago. Obtained CT notable  for nondisplaced hairline fracture of the right distal femoral metadiaphysis Anterolaterally. Dw ortho Dr Lauralee Evener who recommended knee immobilizer and nonweightbearing status.  Given patient age and functional status, would not be able to function nonweight bearing.  Will transfer to [place] for medical admission for PT/rehab. Pain appears to be well controlled when patient is at rest. Offered analgesia for now though declined.  Discussed plan of care with patient and is in agreement with plan      ED Course as of 09/23/22 1943   Wed Sep 23, 2022   6235 89 year old male presents to ED  for evaluation of right knee pain in setting of recent fall few days ago.  Normally ambulates with a walker.  Has been having extreme difficulty walking and bearing weight with his walker since the fall.  Family thought symptoms may improve with just time however given still not able to walk, brought patient in for further evaluation.  Patient reports no pain at rest however with any movement of the knee has a lot of pain.    Overall impression, 89 year old with right knee pain in setting of recent traumatic injury with difficult ambulating.  Lower extremity is grossly neurovascularly intact however obtain CT 1.Nondisplaced hairline fracture of the right distal femoral metadiaphysis  anterolaterally; there may be some early callus formation at this fracture  site.     2.Healed fracture of the femoral diaphysis status post ORIF, partially  covered on this study.     3.Chronic fragmentation of the superomedial pole of the patella.     4.Intact right total knee arthroplasty.  5.Osteoporosis.  Will plan to discuss with orthopedic [DT]      ED Course User Index  [DT] Richrd Humbles, MD         Problems Addressed:    Patient's care impacted by chronic condition(s) as per HPI, if pertinent. Appropriate follow-up resources provided.   The differential diagnosis associated with patient's presentation: Fracture versus soft tissue injury. Addressed presenting symptoms by obtaining CT of knee .   Disposition: Admit to [employer] and Complexity of Data Reviewed:    Pulse Oximetry Analysis:  Interpreted by me. 98% on RA - Normal  Cardiac Monitor: Interpreted by me. Rhythm:  Normal Sinus, Rate:  Normal, Ectopy:  None      * CT/US/MRI, as applicable: Ordered and reviewed  by me, confirmed by radiology report. See Provider Notes/Summary section for discussion.    Based on the test results, I concluded that patient with distal femur fracture.  As a result of the findings as above, I chose the following management:  See Provider Summary/Management section above for details.    Clinical information obtained from an independent historian. History obtained from or confirmed by Patient and Patient's Son, who stated the information as outlined in the HPI.    __________________________________________________________________        Diagnosis     Clinical Impression:   1. Closed fracture of right femur, unspecified fracture morphology, unspecified portion of femur, initial encounter         Disposition:   ED Disposition       ED Disposition   Observation    Condition   --    Date/Time   Wed Sep 23, 2022  1:16 PM    Comment   Admitting Physician: Dorothyann Gibbs [[id number]]   Service:: Medicine [106]   Estimated Length of Stay: < 2 midnights   Tentative Discharge Plan?:  Home or Self Care [1]   Does patient need telemetry?: No                 The above diagnostic process was due to medical necessity based on risk stratification of potential harm of patient's presenting complaint.     CHART OWNERSHIP: This note is prepared by Richrd Humbles, MD. I am the first provider for this patient.    This note was generated by the Epic EMR system/ Dragon speech recognition and may contain inherent errors or omissions not intended by the user. Grammatical errors, random word insertions, deletions and pronoun errors  are occasional consequences of this technology due to software limitations. Not all errors are caught or corrected. If there are questions or concerns about the content of this note or information contained within the body of this dictation they should be addressed directly with the author for clarification.    Electronically signed by Richrd Humbles, MD.       Richrd Humbles, MD  09/23/22 [phone]

## 2022-09-24 ENCOUNTER — Other Ambulatory Visit (INDEPENDENT_AMBULATORY_CARE_PROVIDER_SITE_OTHER): Payer: Self-pay | Admitting: Student in an Organized Health Care Education/Training Program

## 2022-09-24 ENCOUNTER — Observation Stay: Payer: Medicare Other

## 2022-09-24 DIAGNOSIS — R001 Bradycardia, unspecified: Secondary | ICD-10-CM

## 2022-09-24 DIAGNOSIS — I441 Atrioventricular block, second degree: Secondary | ICD-10-CM

## 2022-09-24 DIAGNOSIS — I5022 Chronic systolic (congestive) heart failure: Secondary | ICD-10-CM

## 2022-09-24 LAB — COMPREHENSIVE METABOLIC PANEL
ALT: 12 U/L (ref 0–55)
AST (SGOT): 23 U/L (ref 5–41)
Albumin/Globulin Ratio: 0.8 — ABNORMAL LOW (ref 0.9–2.2)
Albumin: 3.5 g/dL (ref 3.5–5.0)
Alkaline Phosphatase: 58 U/L (ref 37–117)
Anion Gap: 4 — ABNORMAL LOW (ref 5.0–15.0)
BUN: 37 mg/dL — ABNORMAL HIGH (ref 9–28)
Bilirubin, Total: 0.8 mg/dL (ref 0.2–1.2)
CO2: 24 mEq/L (ref 17–29)
Calcium: 9.7 mg/dL (ref 7.9–10.2)
Chloride: 113 mEq/L — ABNORMAL HIGH (ref 99–111)
Creatinine: 1.6 mg/dL — ABNORMAL HIGH (ref 0.5–1.5)
GFR: 39.4 mL/min/{1.73_m2} — ABNORMAL LOW (ref >=60.0)
Globulin: 4.2 g/dL — ABNORMAL HIGH (ref 2.0–3.6)
Glucose: 80 mg/dL (ref 70–100)
Potassium: 4.8 mEq/L (ref 3.5–5.3)
Protein, Total: 7.7 g/dL (ref 6.0–8.3)
Sodium: 141 mEq/L (ref 135–145)

## 2022-09-24 LAB — LAB USE ONLY - CBC WITH DIFFERENTIAL
Absolute Basophils: 0.02 10*3/uL (ref 0.00–0.08)
Absolute Eosinophils: 0.17 10*3/uL (ref 0.00–0.44)
Absolute Immature Granulocytes: 0.01 10*3/uL (ref 0.00–0.07)
Absolute Lymphocytes: 1.23 10*3/uL (ref 0.42–3.22)
Absolute Monocytes: 0.4 10*3/uL (ref 0.21–0.85)
Absolute Neutrophils: 1.49 10*3/uL (ref 1.10–6.33)
Absolute nRBC: 0 10*3/uL (ref <=0.00)
Basophils %: 0.6 %
Eosinophils %: 5.1 %
Hematocrit: 36.6 % — ABNORMAL LOW (ref 37.6–49.6)
Hemoglobin: 11.6 g/dL — ABNORMAL LOW (ref 12.5–17.1)
Immature Granulocytes %: 0.3 %
Lymphocytes %: 37 %
MCH: 33.5 pg (ref 25.1–33.5)
MCHC: 31.7 g/dL (ref 31.5–35.8)
MCV: 105.8 fL — ABNORMAL HIGH (ref 78.0–96.0)
MPV: 12.1 fL (ref 8.9–12.5)
Monocytes %: 12 %
Neutrophils %: 45 %
Platelet Count: 81 10*3/uL — ABNORMAL LOW (ref 142–346)
Preliminary Absolute Neutrophil Count: 1.49 10*3/uL (ref 1.10–6.33)
RBC: 3.46 10*6/uL — ABNORMAL LOW (ref 4.20–5.90)
RDW: 13 % (ref 11–15)
WBC: 3.32 10*3/uL (ref 3.10–9.50)
nRBC %: 0 /100 WBC (ref <=0.0)

## 2022-09-24 MED ORDER — ROSUVASTATIN CALCIUM 20 MG PO TABS
20.0000 mg | ORAL_TABLET | Freq: Every day | ORAL | 3 refills | Status: DC
Start: 2022-09-24 — End: 2022-09-29

## 2022-09-24 NOTE — Plan of Care (Addendum)
VSS, afebrile, A+Ox2, total care, with no complaints.  Pt's HR drops to 30s and pt has frequent pauses, the longest one of 3 seconds and later of 2.8 seconds.  MDs and family aware.  BP and platelets low in AM, BP meds and Lovenox held, MDs aware.  Family at bedside.    Problem: Hemodynamic Status: Cardiac  Goal: Stable vital signs and fluid balance  Outcome: Progressing  Flowsheets (Taken 09/24/2022 1846)  Stable vital signs and fluid balance:   Assess signs and symptoms associated with cardiac rhythm changes   Monitor lab values

## 2022-09-24 NOTE — UM Notes (Addendum)
Please Call [phone] with any questions or concerns. Confidential voicemail.  Fax final authorizations and requests for additional information Fax to [phone]    ER ADMIT DATE AND TIME: 09/23/2022 11:08 AM    OBS ADMIT DATE: 09/23/2022 11:08 AM    Observation changed to inpatient 09/24/22  Admit to Inpatient (Order [id number])  Admission  Date: 09/24/2022 Department: [facility] [address] [facility]     Primary Payor: Payor: MEDICARE / Plan: MEDICARE PART A AND B / Product Type: Medicare /      Rick [name]       DOB: [birth year]  MRN: [id number]    Admission Diagnosis: Closed fracture of right femur, unspecified fracture morphology, unspecified portion of femur, initial encounter [S72.91XA]     Initial Admission Clinical  H&P: Rick [name] is a 89 y.o. male with a PMHx of dementia, MGUS, HFrEF with EF 30%, CAD, HTN, HLD, intermittent Mobitz type II AV block who presented with right knee pain.     Patient presents in the setting of right knee pain after recent fall several days ago.  He normally ambulates with a walker.  He has been having extreme difficulty walking and bearing weight with his walker since the fall.  Family initially believed that his symptoms might improve with time but decided to bring him to the ED when he remained unable to walk.  Patient reports no pain at rest however any movement of the knee causes significant pain.     In the ED, patient intermittently bradycardic with vital signs otherwise within normal limits.  No labs were obtained in the ED today.  CT right knee without contrast showed nondisplaced hairline fracture of the right distal femoral metadiaphysis, healed fracture of the femoral diaphysis status post ORIF, chronic fragmentation of the superior medial pole of the patella.   Patient was recently admitted to [facility]/24/2022 to 08/27/2022 for for hypotension.  During this hospitalization he was found to be bradycardic with a 3-second pause and  intermittent Mobitz type II AV block.  He was evaluated by cardiology who recommended EP consultation.  EP evaluated the patient and recommended no new intervention at this point.  It was discussed with the patient's son that pacemaker placement be avoided given patient's dementia and advanced age.    ED VS:    Temp:  [97.3 F (36.3 C)-98 F (36.7 C)]   Heart Rate:  [42-63]   Resp Rate:  [12-20]   BP: (96-154)/(42-72)   SpO2:  [97 %-100 %]   Height:  [172.7 cm (5\' 8" )]   Weight:  [71.5 kg (157 lb 9.6 oz)-71.7 kg (158 lb)]   BMI (calculated):  [24]   Musculoskeletal: No peripheral edema, gross deformity. No pain at rest but with movement of RLE, having significant paint. Neurovascularly intact. Soft compartments.   08/08 0401    Sodium 141       Potassium 4.8       Chloride 113 High        CO2 24       BUN 37 High        Creatinine 1.6 High        Hemoglobin 11.6 Low        Hematocrit 36.6 Low        WBC 3.32       Platelets 81 Low        Glucose 80        CT Knee Right without Contrast (Final result)  Impression:  1.Nondisplaced hairline fracture of the right distal femoral metadiaphysis  anterolaterally; there may be some early callus formation at this fracture  site.    2.Healed fracture of the femoral diaphysis status post ORIF, partially  covered on this study.    3.Chronic fragmentation of the superomedial pole of the patella.    4.Intact right total knee arthroplasty.  5.Osteoporosis.              Provider Notes/Summary:      89 y.o. male with RLE pain in setting of fall few days prior.  Hx of old right knee replacement almost 20 years ago. Obtained CT notable for nondisplaced hairline fracture of the right distal femoral metadiaphysis Anterolaterally. Dw ortho Dr Lauralee Evener who recommended knee immobilizer and nonweightbearing status.  Given patient age and functional status, would not be able to function nonweight bearing.  Will transfer to [place] for medical admission for PT/rehab. Pain appears to be  well controlled when patient is at rest. Offered analgesia for now though declined.  Discussed plan of care with patient and is in agreement with plan    ASSESSMENT & PLAN      Rick [name] is a 89 y.o. male admitted with Closed fracture of right femur, unspecified fracture morphology, unspecified portion of femur, initial encounter.     Closed fracture of right femur  CT right knee without contrast showed nondisplaced hairline fracture of the right distal femoral metadiaphysis, healed fracture of the femoral diaphysis status post ORIF, chronic fragmentation of the superior medial pole of the patella.  -PT OT  -Non weightbearing  -Place right knee immobilizer  -Consult to orthopedic surgery       Bradycardia  Intermittent Mobitz type II AV block  During last admission, patient was evaluated by cardiology and received EP evaluation.  EP recommended no new intervention at this time.  Pacemaker implantation was discussed with patient's son but he decided against it given patient's advanced age and dementia.  Memantine and donepezil were discontinued because of bradycardia.  Palliative care evaluation was recommended.  -AM consult to palliative care  -No need for cardiology consult this admission     Chronic heart failure with reduced EF  -Continue Entresto and Jardiance     Dementia  -Donepezil and Memantine discontinued during previous admission     Chronic thrombocytopenia  -Monitor daily CBC     Coronary artery disease  -Continue statin     History of PE  -Patient is not on anticoagulation     Nutrition: Heart healthy diet     DVT/VTE Prophylaxis:          Current Facility-Administered Medications (Includes Only Anticoagulants, Misc. Hematological)   Medication Dose Route Last Admin    enoxaparin (LOVENOX) syringe 30 mg  30 mg Subcutaneous 30 mg at 09/23/22 1829   Code Status: NO CPR - SUPPORT OK  Patient Class: OBSERVATION.  Anticipated medical stability for discharge: 24 - 48 Hours     Hospital stay  day 1    Scheduled Meds:  Current Facility-Administered Medications   Medication Dose Route Frequency    docusate sodium  100 mg Oral BID    empagliflozin  10 mg Oral Daily    enoxaparin  30 mg Subcutaneous Daily    rosuvastatin  10 mg Oral QHS    sacubitril-valsartan  0.5 tablet Oral BID    spironolactone  12.5 mg Oral Daily    tamsulosin  0.4 mg Oral QPM     Continuous Infusions:  PRN Meds:.acetaminophen, acetaminophen, benzocaine-menthol, benzonatate, carboxymethylcellulose sodium, dextrose **OR** dextrose **OR** dextrose **OR** glucagon (rDNA), magnesium sulfate, melatonin, naloxone, potassium & sodium phosphates, potassium chloride **AND** potassium chloride, saline    Kind regards,     Kathline Magic, MSN   Utilization Review Case Manager I  [facility] [employer] [facility]  [address]  [address], [address]  [place], [place] [address]  Phone: [phone] (VM only)  [place]: [phone]  Central Fax: - [phone] Please Fax all approvals/denials  [email or web address]    Facility Tax ID & NPI     Tax ID NPI   [place] [id number]   Shea Stakes [id number]   [place] [id number]   [place] [id number] - NEW;   [id number] - OLD;   Mackie Pai  [phone] [phone]     This communication may contain confidential and/or privileged information. Additionally, this communication may contain protected health information (PHI) that is legally protected from inappropriate disclosure by the Privacy Standards of the [employer] Portability and Accountability Act (HIPAA) and relevant [employer]. If you are not the intended recipient, please note that any dissemination, distribution or copying of this communication is strictly prohibited. If you have received this message in error, you should notify the sender immediately by telephone or by return e-mail and delete this message from your computer. Direct questions to the Personal assistant  at [phone].

## 2022-09-24 NOTE — Telephone Encounter (Signed)
duplicate

## 2022-09-24 NOTE — Progress Notes (Signed)
NURSING SHIFT NOTE     Patient: Rick [name]  Day: 0      SHIFT EVENTS     Shift Narrative/Significant Events (PRN med administration, fall, RRT, etc.):   Resumed the care of pt from RN Junious Dresser, Pt reportedly is A&)x1-2, observed confused and per report has attempted getting outy of bed, over head him talk to self, appears sleeping though, bed alarm in place bed low and locked, knee immobilizer in place, plan to continue Pt sections, safety and pain mgt. Pt had several beats of V-tac brady and asymptomatic pulses, cardiologist had seen pt before and did not want any intervention due to age and dementia condition.    Safety and fall precautions remain in place. Purposeful rounding completed.          ASSESSMENT     Changes in assessment from patient's baseline this shift:    Neuro: No  CV: No  Pulm: No  Peripheral Vascular: No  HEENT: No  GI: No  BM during shift: No, Last BM: Last BM Date: 09/22/22  GU: No   Integ: No  MS: No    Pain: None  Pain Interventions: Rest  Medications Utilized: None    Mobility: PMP Activity: Step 3 - Bed Mobility of Distance Walked (ft) (Step 6,7): 0 Feet           Lines     Patient Lines/Drains/Airways Status       Active Lines, Drains and Airways       Name Placement date Placement time Site Days    Peripheral IV 09/23/22 24 G Standard Anterior;Left Forearm 09/23/22  2356  Forearm  less than 1    External Urinary Catheter 09/23/22  1730  --  1                         VITAL SIGNS     Vitals:    09/24/22 2328   BP: 159/75   Pulse: 73   Resp: 17   Temp: 98.2 F (36.8 C)   SpO2: 97%       Temp  Min: 97.3 F (36.3 C)  Max: 98.2 F (36.8 C)  Pulse  Min: 54  Max: 81  Resp  Min: 16  Max: 18  BP  Min: 90/61  Max: 168/72  SpO2  Min: 97 %  Max: 100 %      Intake/Output Summary (Last 24 hours) at 09/24/2022 2350  Last data filed at 09/24/2022 1910  Gross per 24 hour   Intake 1190 ml   Output 250 ml   Net 940 ml            CARE PLAN   Plan for PT/OT sections, Pain mgt, knee immobilization,  total care

## 2022-09-24 NOTE — PT Eval Note (Addendum)
Physical Therapy Eval and Treatment  Rick [name]      Post Acute Care Therapy Recommendations   Discharge Recommendations:  SNF    DME needs IF patient is discharging home: No additional equipment/DME recommended at this time    Therapy discharge recommendations may change with patient status.  Please refer to most recent note for up-to-date recommendations.    Unit: [facility]  Bed: A2327/A2327.B    ___________________________________________________    Time of Evaluation and Treatment:  Time Calculation   PT Received On: 09/24/22  Start Time: 1310  Stop Time: 1350  Time Calculation (min): 40 min       Evaluation Time: 10 minutes  Treatment Time: 30 minutes    Chart Review and Collaboration with Care Team: 5 minutes, not included in above time    PT Visit Number: 1    Consult received for Rick [name] for PT Evaluation and Treatment.  Patient's medical condition is appropriate for Physical therapy intervention at this time.    Activity Orders:  PT eval and treat    Precautions and Contraindications:  Precautions  Weight Bearing Status: RLE non weight bearing  Fall Risks: History of fall(s), High, Impaired balance/gait, Impaired mobility, Muscle weakness  Other Precautions: R knee immobilizer, dementia    Personal Protective Equipment (PPE)  gloves and procedure mask    Medical Diagnosis:  Closed fracture of right femur, unspecified fracture morphology, unspecified portion of femur, initial encounter [S72.91XA]    History of Present Illness:  Rick [name] is a 89 y.o. male admitted on 09/23/2022 with PMHx of dementia, MGUS, HFrEF with EF 30%, CAD, HTN, HLD, intermittent Mobitz type II AV block who presented with right knee pain.     Patient presents in the setting of right knee pain after recent fall several days ago.  He normally ambulates with a walker.  He has been having extreme difficulty walking and bearing weight with his walker since the fall.  Family  initially believed that his symptoms might improve with time but decided to bring him to the ED when he remained unable to walk.  Patient reports no pain at rest however any movement of the knee causes significant pain.     In the ED, patient intermittently bradycardic with vital signs otherwise within normal limits.  No labs were obtained in the ED today.  CT right knee without contrast showed nondisplaced hairline fracture of the right distal femoral metadiaphysis, healed fracture of the femoral diaphysis status post ORIF, chronic fragmentation of the superior medial pole of the patella.     Patient was recently admitted to [facility] 08/24/2022 to 08/27/2022 for for hypotension.  During this hospitalization he was found to be bradycardic with a 3-second pause and intermittent Mobitz type II AV block.  He was evaluated by cardiology who recommended EP consultation.  EP evaluated the patient and recommended no new intervention at this point.  It was discussed with the patient's son that pacemaker placement be avoided given patient's dementia and advanced age.       Problem List[1]    Past Medical/Surgical History:  Medical History[2]  Past Surgical History:   Procedure Laterality Date    COLONOSCOPY, DIAGNOSTIC (SCREENING)      CORONARY ARTERY BYPASS GRAFT  2003     approx year: LIMA to LAD,  SVG to OM 1 & OM 2 of circumflex CA, & SVG to RCA    FRACTURE SURGERY Right 06/2016  femur    HERNIORRHAPHY, INGUINAL Right 04/29/2021    Procedure: OPEN RIGHT HERNIORRHAPHY, INGUINAL WITH MESH;  Surgeon: Towanda Octave, MD;  Location: [facility];  Service: General;  Laterality: Right;    PERCUTANEOUS VALVULOPLASTY - AORTIC N/A 07/12/2019    Procedure: TAVR #2;  Surgeon: Arlyss Queen, MD PhD;  Location: [facility];  Service: Cardiovascular;  Laterality: N/A;  23 HR OB    REPLACEMENT, AORTIC VALVE , EDWARDS SAPIEN N/A 07/12/2019    Procedure: REPLACEMENT, AORTIC VALVE , EDWARDS SAPIEN -- TAVR Valve:   S3 29mm  -- Access: RTF -- IC: Dr. Penny Pia;  Surgeon: Christene Lye, MD;  Location[facility];  Service: Cardiothoracic;  Laterality: N/A;    RIGHT & LEFT HEART CATH POSSIBLE PCI N/A 05/26/2019    Procedure: RIGHT & LEFT HEART CATH POSSIBLE PCI;  Surgeon: Arlyss Queen, MD PhD;  Location: [facility];  Service: Cardiovascular;  Laterality: N/A;  [id number] scheduled with Maira in dr's office. OP. kr    TOTAL KNEE ARTHROPLASTY Left 2017    TOTAL KNEE ARTHROPLASTY Right 2005       X-Rays/Tests/Labs:  Lab Results   Component Value Date/Time    HGB 11.6 (L) 09/24/2022 04:01 AM    HGB 12.9 06/18/2022 09:04 AM    HCT 36.6 (L) 09/24/2022 04:01 AM    HCT 40.0 06/18/2022 09:04 AM    K 4.8 09/24/2022 04:01 AM    K 5.2 06/18/2022 09:04 AM    NA 141 09/24/2022 04:01 AM    NA 145 06/18/2022 09:04 AM    INR 1.2 (H) 07/10/2019 02:22 PM    TROPI 17.6 04/27/2021 06:14 PM    TROPI 26.2 04/09/2021 01:10 AM    TROPI 33.2 04/08/2021 02:33 PM       All imaging reviewed, please see chart for details.    Social History:  Prior Level of Function  Prior level of function: Ambulates with assistive device, Needs assistance with ADLs  Assistive Device: Four wheel walker (Rollator)  Baseline Activity Level: Household ambulation  Ambulated 100 feet or more prior to admission: No  Driving: does not drive  DME Currently at Home: Dan Humphreys, Four [employer]    Home Living Arrangements  Living Arrangements: Children  Type of Home: House (Townhouse)  Home Layout: Multi-level, Able to live on main level with bedroom/bathroom  DME Currently at Home: Dan Humphreys, Four [employer]  Home Living - Notes / Comments: Pt lives with children in a townhome. Children are able to provide limited support. Pt has caregiver assistance for bathing/dressing in the AM 5 days a week.      Subjective:  Patient is agreeable to participation in the therapy session.  Patient Goal: To get better  Pain Assessment  Pain Assessment: Numeric Scale (0-10)  Pain Score: 3-mild pain  POSS Score:  Awake and Alert  Pain Location: Leg  Pain Orientation: Right  Pain Descriptors: Aching;Constant  Pain Frequency: Constant/continuous;Increases with movement  Effect of Pain on Daily Activities: severe  Patient's Stated Comfort Functional Goal: 1-mild pain  Pain Intervention(s): Medication (See eMAR)      Objective:  Observation of Patient/Vital Signs:  Vitals:    09/24/22 1215   BP: 99/45   Pulse: 61   Resp: 18   Temp: 97.7 F (36.5 C)   SpO2: 100%        Inspection/Posture: Pt received supine in bed with daughter at bedside    Cognitive Status and Neuro Exam:  Cognition/Neuro  Status  Arousal/Alertness: Appropriate responses to stimuli  Attention Span: Appears intact  Orientation Level: Oriented X4  Memory: Appears intact  Following Commands: Follows all commands and directions without difficulty  Safety Awareness: minimal verbal instruction  Insights: Decreased awareness of deficits  Problem Solving: Able to problem solve independently  Behavior: calm;cooperative  Motor Planning: decreased processing speed;decreased initiation  Coordination: GMC impaired    Musculoskeletal Examination  Gross ROM  Right Upper Extremity ROM: within functional limits  Left Upper Extremity ROM: within functional limits  Right Lower Extremity ROM: unable to assess (R knee in immobilizer)  Left Lower Extremity ROM: within functional limits  Gross Strength  Right Upper Extremity Strength: within functional limits  Left Upper Extremity Strength: within functional limits  Right Lower Extremity Strength: unable to assess (R knee in immobilizer)  Left Lower Extremity Strength: within functional limits       Functional Mobility:  Functional Mobility  Supine to Sit: Maximal Assist;Increased Time;Increased Effort;using bedrail;to Left  Scooting to HOB: Dependent (x2 person assist)  Scooting to EOB: Increased Time;Increased Effort;Moderate Assist;using bedrail  Sit to Supine: Maximal Assist  Sit to Stand: Unable to assess (Comment) (pt  demonstrates poor trunk control/sitting balance)     Locomotion  Ambulation: Unable to assess (Comment) (pt demonstrates poor trunk control/sitting balance)     Balance  Balance  Balance: needs focused assessment  Sitting - Static: Poor (sitting at EOB, requires b/l UE to maintain upright position, minA to prevent LOB)    Participation and Activity Tolerance  Participation and Endurance  Participation Effort: good  Endurance: Tolerates 10 - 20 min exercise with multiple rests  [place] Dyspnea Scale: 1+ Dyspnea    Educated the Patient to role of physical therapy, plan of care, goals of therapy and safety with mobility and ADLs, weight bearing precautions, discharge instructions, home safety with verbalized understanding  and demonstrated understanding.    Patient left in bed with alarm and all other medical equipment in place and call bell and all personal items/needs within reach.  RN notified of session outcome. Notified CM team and attending of d/c recommendation via secure chat.    Assessment:  Rick [name] is a 89 y.o. male admitted 09/23/2022.  PT Assessment  Assessment: Decreased LE ROM;Decreased LE strength;Decreased safety/judgement during functional mobility;Decreased endurance/activity tolerance;Impaired coordination;Impaired motor control;Decreased functional mobility;Decreased balance;Gait impairment  Prognosis: Good;With continued PT status post acute discharge  Progress: Slow progress, decreased activity tolerance    Treatment:  Patient received supine in bed w/ R knee immobilizer in place and   daughter in law at bedside. Pt agreed to participate in PT Session. Educated pt/dtr in Social worker on PT role in acute setting, weight-bearing precautions and progressive mobility protocol. Increased time/effort required for pt to transition from supine > sit and required max assist for trunk and R LE assistance.Once at EOB, R LE was propped on chair. Pt reported increased R LE pain with transition.  Pt demonstrated poor trunk control while sitting at EOB and required minimal assistance to maintain sitting balance. Pt was then transitioned back to supine position and tray table was set up for patient to eat lunch. Discussed discharge plan for SNF with pt/ dtr in law who was agreeable to discharge plan.  Pt will continue to benefit from skilled PT to maximize independence with goals listed below.     Plan:  Treatment/Interventions: Exercise, Gait training, Neuromuscular re-education, Stair training, Functional transfer training, LE strengthening/ROM, Endurance training, Patient/family training, Equipment eval/education,  Bed mobility, Continued evaluation  PT Frequency: 2-3x/wk  Risks/Benefits/POC Discussed with Pt/Family: With patient/family    PMP Activity: Step 4 - Dangle at Bedside  Distance Walked (ft) (Step 6,7): 0 Feet    Goals:  Goals  Time for Goal Acheivement: By time of discharge  Pt Will Go Supine To Sit: with minimal assist, to maximize functional mobility and independence  Pt Will Sit Edge of Bed: 3-5 min, to maximize functional mobility and independence, with contact guard assist  Pt Will Perform Sit to Stand: to maximize functional mobility and independence, with moderate assist  Pt Will Transfer Bed/Chair: with rolling walker, with moderate assist, to maximize functional mobility and independence    Rick [name] PT,DPT  09/24/2022  2:14 PM    Physical Therapist  Physical Medicine and Rehabilitation   [phone]        [facility]  Patient: Rick [name] MRN#: [id number]  Unit: [facility] Bed: F5732/K0254.B        [1]   Patient Active Problem List  Diagnosis    Memory deficits    Coronary artery disease with history of myocardial infarction without history of CABG    Vertebrogenic low back pain    Nonrheumatic aortic valve stenosis    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    Dry cough    Aortic valve stenosis,  etiology of cardiac valve disease unspecified    S/P TAVR (transcatheter aortic valve replacement)    Difficulty in walking, not elsewhere classified    Dementia without behavioral disturbance    Balance disorder    Chronic systolic CHF (congestive heart failure)    Acute pulmonary embolism with acute cor pulmonale, unspecified pulmonary embolism type    Pulmonary embolism with acute cor pulmonale, unspecified chronicity, unspecified pulmonary embolism type    Incarcerated inguinal hernia    Acute renal failure with tubular necrosis    Vascular dementia    Thrombocytopenia    Smoldering myeloma    Artificial knee joint present    Benign prostatic hyperplasia    Fracture of femur    Hallux valgus (acquired), right foot    History of total right knee replacement    Hypertension    IgG monoclonal gammopathy of uncertain significance    Loose total knee arthroplasty    Nocturia    Old myocardial infarction    Osteoarthritis of left knee    Other hammer toe(s) (acquired), left foot    Other hammer toe(s) (acquired), right foot    Other spondylosis with myelopathy, cervical region    URI with cough and congestion    Chronic bilateral low back pain without sciatica    Combined forms of age-related cataract of right eye    Combined forms of age-related cataract of left eye    Stage 3b chronic kidney disease    AKI (acute kidney injury)    Acute kidney failure    HFrEF (heart failure with reduced ejection fraction)    Closed fracture of right femur, unspecified fracture morphology, unspecified portion of femur, initial encounter   [2]   Past Medical History:  Diagnosis Date    Arrhythmia 2021    RBBB    Atrial enlargement, bilateral Echo result 04/04/19    Bilateral cataracts     Son reports pt has the ability to lay flat for 45+min without CP or SOB as reported on 05/14/22 for DOS  06/04/22    CAD (coronary  artery disease) 05/28/19 Cath results.    Severe 3V CAD.  Patent LIMA to LAD, patent SVG to OM 1 and OM 2 of circumflex  CA, & patent SVG to RCA.    Chronic combined systolic and diastolic heart failure 310/21 OV Dr. Penny Pia    EF=30%    Dementia 04/2014    Difficulty walking 06/2016    Post surgery of lt knee, rt femur/ uses walker and wheelchair for long distances prn    Disorder of prostate     Dyslipidemia 310/21 OV Dr. Penny Pia    Dyspnea on exertion 02/2018    occas per son report on 05/14/22, doing better    Hearing loss     mild . does not like to wear/ bilat    HTN (hypertension) 310/21 OV Dr. Penny Pia    Son reports unaware of htn dx as reported on 3/114/24    Low back pain     muscular    Primary cardiomyopathy 11/2017    Urinary incontinence     occas  2-3 monthly at qhs or in am    VTE (venous thromboembolism) Feb 2023    In lungs, combined with bronchitis

## 2022-09-24 NOTE — Plan of Care (Signed)
NURSING SHIFT NOTE     Patient: Rick [name]  Day: 0      SHIFT EVENTS     Shift Narrative/Significant Events (PRN med administration, fall, RRT, etc.):     Patient Aox3, RA, no complains of pain or discomfort. Scheduled meds given, tolerated well. Assisted with ADLs.  Call light and phone within reach. Bed in the lowest position. Explain plan of care to patient and verbalized understanding. Safety and fall precautions remain in place. Purposeful rounding completed.           ASSESSMENT     Changes in assessment from patient's baseline this shift:    Neuro: No  CV: No  Pulm: No  Peripheral Vascular: No  HEENT: No  GI: No  BM during shift: No, Last BM:    GU: No   Integ: No  MS: No    Pain: None  Pain Interventions: None  Medications Utilized:     Mobility: PMP Activity: Step 3 - Bed Mobility of Distance Walked (ft) (Step 6,7): 0 Feet           Lines     Patient Lines/Drains/Airways Status       Active Lines, Drains and Airways       Name Placement date Placement time Site Days    Peripheral IV 09/23/22 24 G Standard Anterior;Left Forearm 09/23/22  2356  Forearm  less than 1    External Urinary Catheter 09/23/22  1730  --  less than 1                         VITAL SIGNS     Vitals:    09/24/22 0350   BP: 143/60   Pulse: (!) 54   Resp: 18   Temp: 97.3 F (36.3 C)   SpO2: 98%       Temp  Min: 97.3 F (36.3 C)  Max: 98 F (36.7 C)  Pulse  Min: 42  Max: 63  Resp  Min: 12  Max: 20  BP  Min: 96/64  Max: 154/67  SpO2  Min: 97 %  Max: 100 %      Intake/Output Summary (Last 24 hours) at 09/24/2022 0523  Last data filed at 09/23/2022 2100  Gross per 24 hour   Intake 240 ml   Output --   Net 240 ml            CARE PLAN       Problem: Moderate/High Fall Risk Score >5  Goal: Patient will remain free of falls  Outcome: Progressing  Flowsheets (Taken 09/24/2022 0521)  High (Greater than 13):   HIGH-Consider use of low bed   HIGH-Bed alarm on at all times while patient in bed   HIGH-Apply yellow "Fall Risk" arm band  VH  Moderate Risk (6-13): Use of floor mat     Problem: Pain interferes with ability to perform ADL  Goal: Pain at adequate level as identified by patient  Outcome: Progressing  Flowsheets (Taken 09/24/2022 0521)  Pain at adequate level as identified by patient:   Identify patient comfort function goal   Assess for risk of opioid induced respiratory depression, including snoring/sleep apnea. Alert healthcare team of risk factors identified.   Evaluate patient's satisfaction with pain management progress   Consult/collaborate with Pain Service   Evaluate if patient comfort function goal is met   Offer non-pharmacological pain management interventions   Consult/collaborate with Physical Therapy, Occupational Therapy, and/or  Speech Therapy     Problem: Side Effects from Pain Analgesia  Goal: Patient will experience minimal side effects of analgesic therapy  Outcome: Progressing  Flowsheets (Taken 09/24/2022 0521)  Patient will experience minimal side effects of analgesic therapy: Monitor/assess patient's respiratory status (RR depth, effort, breath sounds)     Problem: Compromised Sensory Perception  Goal: Sensory Perception Interventions  Outcome: Progressing

## 2022-09-24 NOTE — Consults (Addendum)
CARDIOLOGY HOSPITAL NOTE  Gilford Raid, NP   [facility] Cardiology  Office phone:  [phone]     [facility] phone [phone]  [facility] phone [phone]    Date:  09/24/2022  9:25 AM    Patient:  Rick [name].  DOB: March 21, 1924.  89 y.o.  male , MRN [id number]   Attending:  Rinaldo Cloud, MD   Admission date:  09/23/2022     Cardiology Attending: I performed the substantive portion of this visit by personally conducting the Medical Decision Making component, in its entirety. Patient was also seen by APP Phillis Haggis for the history and/or  examination portion of the visit. I reviewed and updated the documented findings and plan of care accordingly as noted.  I have independently reviewed ECG and telemetry tracings and chest x-ray images.  Called for bradycardia.  Patient with known sinus rhythm with second-degree A-V block Mobitz 1.  Rare episodes of heart rates in the 30s, no significant pauses.  Heart rates in the 50s.  He is asymptomatic.  Continue to avoid AV nodal blocking agents, patient seen by EP, no intervention required.  Continue to monitor.  Known cardiomyopathy with EF 41% on most recent echocardiogram 08/2022.  Volume status is stable.  Continue with GDMT as below, Entresto, spironolactone, empagliflozin, monitor renal function..  No further cardiac recommendations, will see as needed.  Please call with questions.  Follow-up with primary cardiologist Dr. Glean Hess post  discharge.    Kyra Searles MD    ASSESSMENT & PLAN:     89 y.o. male with past medical history for heart failure reduced ejection fraction, coronary artery disease, hypertension, hyperlipidemia, intermittent Mobitz type II, PVCs, NSVT, dementia and monoclonal gammopathy (MGUS) who presented to ED on 09/23/22 s/p fall and diagnosed with close fracture of right femur. Patient has a history of bradycardia down to 30s overnight.  Cardiac consultation for bradycardia down to 30s.     Primary  diagnosis: close fracture of right femur    Close fracture of right femur  -surgery to evaluate    Mobitz Type I  -intermittent Type I  -chronic  -EP evaluation on 08/26/22 did not believe block would be life threatening    PVCs and NSVT  -chronic high volume  -unable to tolerate medications due to SA anv AV blocks  -5-beat NSVT at 9:54 8/8    HFrEF  -LVEF 41%  -euvolemic  -Daily weight 147-155 lbs. Today is 157 on bedscale    Cardiomyopathy  -suspect mixed ischemic and non-ischemic, no further workup in past due to advanced age, has been medically managed  Guideline Directed Medical Therapy   Beta Blocker: SA and AV block make contraindicated   ACEi/ARB/ARNI: 1/2 tablet low dose Entresto BID    FTD:[id number] 12.5 mg daily - may need to hold Cr 1.6, baseline 1.3-2.1 (recently)   SGLT2i:Jardiance 10 mg daily    Diuretic: none     Coronary artery disease  -continue rosuvastatin 10 mg daily, platelets 81 likely in setting of MGUS, currently avoid ASA      HISTORY OF PRESENT ILLNESS:     89 y.o. male with past medical history for heart failure reduced ejection fraction, coronary artery disease, hypertension, hyperlipidemia, intermittent Mobitz type I, PVCs, NSVT, dementia and monoclonal gammopathy (MGUS) who presented to ED on 09/23/22 s/p fall and diagnosed with close fracture of right femur. Patient has a history of bradycardia down to 30s overnight.  Cardiac consultation for bradycardia down to  30s.     He denies any chest discomfort, palpitations, shortness of breath, orthopnea, PND, bleeding, presyncope or syncope.       REVIEW OF SYSTEMS:   Swelling in right knee, pain with movement, brace on right knee.  Confused   PAST MEDICAL HISTORY:     Past Medical History:   Diagnosis Date    Arrhythmia 2021    RBBB    Atrial enlargement, bilateral Echo result 04/04/19    Bilateral cataracts     Son reports pt has the ability to lay flat for 45+min without CP or SOB as reported on 05/14/22 for DOS  06/04/22    CAD (coronary  artery disease) 05/28/19 Cath results.    Severe 3V CAD.  Patent LIMA to LAD, patent SVG to OM 1 and OM 2 of circumflex CA, & patent SVG to RCA.    Chronic combined systolic and diastolic heart failure 310/21 OV Dr. Penny Pia    EF=30%    Dementia 04/2014    Difficulty walking 06/2016    Post surgery of lt knee, rt femur/ uses walker and wheelchair for long distances prn    Disorder of prostate     Dyslipidemia 310/21 OV Dr. Penny Pia    Dyspnea on exertion 02/2018    occas per son report on 05/14/22, doing better    Hearing loss     mild . does not like to wear/ bilat    HTN (hypertension) 310/21 OV Dr. Penny Pia    Son reports unaware of htn dx as reported on 3/114/24    Low back pain     muscular    Primary cardiomyopathy 11/2017    Urinary incontinence     occas  2-3 monthly at qhs or in am    VTE (venous thromboembolism) Feb 2023    In lungs, combined with bronchitis     Past Surgical History:   Procedure Laterality Date    COLONOSCOPY, DIAGNOSTIC (SCREENING)      CORONARY ARTERY BYPASS GRAFT  2003     approx year: LIMA to LAD,  SVG to OM 1 & OM 2 of circumflex CA, & SVG to RCA    FRACTURE SURGERY Right 06/2016    femur    HERNIORRHAPHY, INGUINAL Right 04/29/2021    Procedure: OPEN RIGHT HERNIORRHAPHY, INGUINAL WITH MESH;  Surgeon: Towanda Octave, MD;  Location: [facility];  Service: General;  Laterality: Right;    PERCUTANEOUS VALVULOPLASTY - AORTIC N/A 07/12/2019    Procedure: TAVR #2;  Surgeon: Arlyss Queen, MD PhD;  Location: [facility];  Service: Cardiovascular;  Laterality: N/A;  23 HR OB    REPLACEMENT, AORTIC VALVE , EDWARDS SAPIEN N/A 07/12/2019    Procedure: REPLACEMENT, AORTIC VALVE , EDWARDS SAPIEN -- TAVR Valve:   S3 29mm -- Access: RTF -- IC: Dr. Penny Pia;  Surgeon: Christene Lye, MD;  Location: [facility] OR;  Service: Cardiothoracic;  Laterality: N/A;    RIGHT & LEFT HEART CATH POSSIBLE PCI N/A 05/26/2019    Procedure: RIGHT & LEFT HEART CATH POSSIBLE PCI;  Surgeon: Arlyss Queen,  MD PhD;  Location: [facility];  Service: Cardiovascular;  Laterality: N/A;  [id number] scheduled with Maira in dr's office. OP. kr    TOTAL KNEE ARTHROPLASTY Left 2017    TOTAL KNEE ARTHROPLASTY Right 2005       SOCIAL HISTORY:     Social History     Tobacco Use    Smoking status: Never  Smokeless tobacco: Never   Substance Use Topics    Alcohol use: Never       FAMILY HISTORY:   family history includes No known problems in his brother, father, mother, and sister.    ALLERGIES:   Allergies[1]     MEDICATIONS:    INFUSION MEDS:      SCHEDULED MEDS:     Current Facility-Administered Medications   Medication Dose Route Frequency    docusate sodium  100 mg Oral BID    empagliflozin  10 mg Oral Daily    enoxaparin  30 mg Subcutaneous Daily    rosuvastatin  10 mg Oral QHS    sacubitril-valsartan  0.5 tablet Oral BID    spironolactone  12.5 mg Oral Daily    tamsulosin  0.4 mg Oral QPM      PRN MEDS:   acetaminophen, acetaminophen, benzocaine-menthol, benzonatate, carboxymethylcellulose sodium, dextrose **OR** dextrose **OR** dextrose **OR** glucagon (rDNA), magnesium sulfate, melatonin, naloxone, potassium & sodium phosphates, potassium chloride **AND** potassium chloride, saline     DIAGNOSTICS:   Telemetry: sinus bradycardia/rhythm rates 48-70s, PVCs, 3 pauses, longest is 2.8 seconds, 5-beat NSVT at 9:54 09/24/22     EKG 08/24/22 13:01: sinus bradycardia at 52 bpm, with bigeminy PVCs, left axis deviation and RBBB    EKG 09/24/22 14:23: sinus bradycardia at 49 bpms with Mobitz I, occasional PVC, RBBB, left anterior fascicular block     CT Right knee 09/23/22:  1.Nondisplaced hairline fracture of the right distal femoral metadiaphysis   anterolaterally; there may be some early callus formation at this fracture   site.     2.Healed fracture of the femoral diaphysis status post ORIF, partially   covered on this study.     3.Chronic fragmentation of the superomedial pole of the patella.     4.Intact right total knee arthroplasty.    5.Osteoporosis.     LABS:     Recent Labs   Lab 09/24/22  0401   WBC 3.32   Hemoglobin 11.6*   Hematocrit 36.6*   Platelet Count 81*     Recent Labs   Lab 09/24/22  0401 09/17/22  1409   Sodium 141 141   Potassium 4.8 5.3   Chloride 113* 112*   CO2 24 20   BUN 37* 31*   Creatinine 1.6* 1.6*   GFR 39.4* 39.4*   Glucose 80 137*   Calcium 9.7 9.7                FLUID BALANCE AND WEIGHT:                                           Last filed weight: Weight: 71.5 kg (157 lb 9.6 oz)  Weight on Admission: Weight: 71.7 kg (158 lb)  Net IO Since Admission: 380 mL [09/24/22 0925]     Intake/Output Summary (Last 24 hours) at 09/24/2022 0700  Last data filed at 09/24/2022 0537  Gross per 24 hour   Intake 630 ml   Output 250 ml   Net 380 ml   Weight change:     Last 3 Weights for the past 72 hrs (Last 3 readings):   Weight   09/24/22 0537 71.5 kg (157 lb 9.6 oz)   09/23/22 1741 71.7 kg (158 lb)   09/23/22 1109 71.7 kg (158 lb)        PHYSICAL EXAM:  BP 141/66   Pulse (!) 57   Temp 97.5 F (36.4 C) (Oral)   Resp 16   Ht 1.727 m (5\' 8" )   Wt 71.5 kg (157 lb 9.6 oz)   SpO2 99%   BMI 23.96 kg/m     General Appearance:  Breathing comfortable, no acute distress  Head:  normocephalic  Eyes:  EOM's intact, nonicteric sclera  Neck:  No carotid bruit or jugular venous distension  Lungs:  Clear to auscultation throughout, no wheezes, rhonchi or rales, good respiratory effort, symmetric chest expansion  Cardiac:  Irregularly irregular, normal S1, S2, no S3, no S4, no rub, no murmurs   Abdomen:  Soft, non-tender, no rebound or guarding, non-distended, positive bowel sounds  Extremities:  No cyanosis or clubbing. No arthritis or deformities. Right knee brace  Vascular: 2+ radial pulses bilaterally, +2 left popliteal  Neurologic:  Alert and oriented x1, mood and affect normal, rambles off topic but amble to follow commands         [1] No Known Allergies

## 2022-09-24 NOTE — Telephone Encounter (Signed)
Last office visit:09/10/22  Last refill on:07/15/22  Upcoming appointment: none

## 2022-09-24 NOTE — Progress Notes (Signed)
09/24/22 1109   Case Management Quick Doc   Acknowledgment of Outpatient/Observation Observation letter given     Brock Bad, CMS

## 2022-09-24 NOTE — Progress Notes (Addendum)
[facility]  PROGRESS NOTE      Patient: Rick [name]  Date: 09/24/2022   LOS: 0 Days  Admission Date: 09/23/2022   MRN: [id number]  Attending: Pilar Plate, NP  When on service as the attending, please contact me on Epic Secure Chat from 7 AM - 7 PM for non-urgent issues. For urgent matters use XTend page from 7 AM - 7 PM.       ASSESSMENT/PLAN     Rick [name] is a 89 y.o. male admitted with Closed fracture of right femur, post fall.  Discussed with orthopedic surgeon Dr. Primitivo Gauze who recommended obtaining right femur x-ray and will review chart.    Addendum:   -Right knee immobilized with no weightbearing, orthopedic consulted for close fracture of right femur, x-ray pending and awaiting recommendations.  Patient with failure to thrive, poor oral intake, will get nutritionist to see, consider starting low-dose mirtazapine, pain management.    Patient Active Hospital Problem List:  Closed fracture of right femur 09/23/2022      Imaging reviewed and read by radiologist including CT right knee without contrast which showed nondisplaced hairline fracture of the right distal femoral metadiaphysis, healed fracture of the femoral diaphysis status post ORIF, chronic fragmentation of the superior medial pole of the patella.  -Discussed with Ortho who requested Xray PA/LAT and will review  -PT OT on board with recs for SNF  -Continue Non weightbearing on right knew  -Continue right knee immobilizer  -Continue pain management and bowel regimen       Bradycardia  Intermittent Mobitz type II AV block  As noted, patient was evaluated by cardiology and EP on prior admissions, pace maker insertion was recommended but patient's son decided against it given patient's advanced age and dementia.  Memantine and donepezil were discontinued because of bradycardia.  -Consult placed for Palliative care    -patient seen by cardiology, no intervention recommended  -Patient continue to have pauses 2-2.8  seconds. No BB  -Monitor patient on Tele     Chronic combined systolic and diastolic heart failure with reduced EF (stable volume status)  Known cardiomyopathy with improved EF from 25-30% to of 41% on echo done 08/2022  -Continue Entresto and Jardiance  -Continue with spironolactone  -Close monitoring of renal function    Coronary artery disease s/p CABG ( Denied CP/sob)  -Continue statin     Dementia  -Donepezil and Memantine discontinued during previous admission  -Monitor for fall     Chronic thrombocytopenia  -Follow daily CBC      History of PE  -Patient is not on anticoagulation    Enlarged prostate  -Continue Flomax 0.4 mg daily         Nutrition: Heart healthy diet           Recent Labs     09/24/22  0401   Platelet Count 81*     Diagnosis: Moderate Thrombocytopenia             Recent Labs   Lab 09/24/22  0401   Hemoglobin 11.6*   Hematocrit 36.6*   MCV 105.8*   WBC 3.32   Platelet Count 81*         Anemia Diagnosis: Chronic: Anemia of Chronic Disease    Code Status: NO CPR - SUPPORT OK    Dispo: Likely SNF in 24-48 hours    Family Contact: none    DVT Prophylaxis:   Current Facility-Administered Medications (Includes Only Anticoagulants, Misc. Hematological)  Medication Dose Route Last Admin    enoxaparin (LOVENOX) syringe 30 mg  30 mg Subcutaneous 30 mg at 09/23/22 1829        CHART  REVIEW & DISCUSSION     The following chart items were reviewed as of 3:55 PM on 09/24/22:  [x]  Lab Results [x]  Imaging Results   [x]  Problem List  [x]  Current Orders [x]  Current Medications  []  Allergies  []  Code Status []  Previous Notes   []  SDoH    The management and plan of care for this patient was discussed with the following specialty consultants:  []  Cardiology  []  Gastroenterology                 []  Infectious Disease  []  Pulmonology []  Neurology                []  Nephrology  []  Neurosurgery [x]  Orthopedic Surgery  []  Heme/Onc  []  General Surgery []  Psychiatry                                   []   Palliative    SUBJECTIVE     Rick [name] states he is feeling much better, pain is manage, no chest pain, no shortness of breath, no palpitations, feeling hungry, had no other complaint    MEDICATIONS     Current Facility-Administered Medications   Medication Dose Route Frequency    docusate sodium  100 mg Oral BID    empagliflozin  10 mg Oral Daily    enoxaparin  30 mg Subcutaneous Daily    rosuvastatin  10 mg Oral QHS    sacubitril-valsartan  0.5 tablet Oral BID    spironolactone  12.5 mg Oral Daily    tamsulosin  0.4 mg Oral QPM     PHYSICAL EXAM     Vitals:    09/24/22 1215   BP: 99/45   Pulse: 61   Resp: 18   Temp: 97.7 F (36.5 C)   SpO2: 100%     Temperature: Temp  Min: 97.3 F (36.3 C)  Max: 97.8 F (36.6 C)  Pulse: Pulse  Min: 42  Max: 61  Respiratory: Resp  Min: 12  Max: 18  Non-Invasive BP: BP  Min: 90/61  Max: 154/67  Pulse Oximetry SpO2  Min: 98 %  Max: 100 %    Intake and Output Summary (Last 24 hours) at Date Time    Intake/Output Summary (Last 24 hours) at 09/24/2022 1555  Last data filed at 09/24/2022 1330  Gross per 24 hour   Intake 1130 ml   Output 250 ml   Net 880 ml     GEN APPEARANCE: Normal affect, hx of dementia, able to answer some questions;     HEENT: PERLA; EOMI; Conjunctiva Clear  NECK: Supple; No bruits  CVS: RRR, S1, S2; No M/G/R  LUNGS: CTAB; No Wheezes; No Rhonchi: No rales  ABD: Soft; No TTP; + Normoactive BS  EXT: No edema; Pulses 2+ and intact, right knew in immobilizer    NEURO: CN 2-12 intact; No Focal neurological deficits  MENTAL STATUS: A+OX1, able to move LE weakly, uses a walker at home    LABS     Recent Labs   Lab 09/24/22  0401   WBC 3.32   RBC 3.46*   Hemoglobin 11.6*   Hematocrit 36.6*   MCV 105.8*   Platelet Count 81*  Recent Labs   Lab 09/24/22  0401   Sodium 141   Potassium 4.8   Chloride 113*   CO2 24   BUN 37*   Creatinine 1.6*   Glucose 80   Calcium 9.7     Recent Labs   Lab 09/24/22  0401   ALT 12   AST (SGOT) 23   Bilirubin, Total 0.8   Albumin  3.5   Alkaline Phosphatase 58     Microbiology Results (last 15 days)       ** No results found for the last 360 hours. **           RADIOLOGY     CT Knee Right without Contrast    Result Date: 09/23/2022  1.Nondisplaced hairline fracture of the right distal femoral metadiaphysis anterolaterally; there may be some early callus formation at this fracture site. 2.Healed fracture of the femoral diaphysis status post ORIF, partially covered on this study. 3.Chronic fragmentation of the superomedial pole of the patella. 4.Intact right total knee arthroplasty. 5.Osteoporosis. Lanier Ensign, MD 09/23/2022 12:38 PM   Echo Results       None          Results for orders placed or performed during the hospital encounter of 06/18/22   CT Head WO Contrast    Narrative    HISTORY: Unwitnessed fall. Posttraumatic headache and weakness. Patient is  on anticoagulation.    COMPARISON: Outside Brain MRI on 09/03/2020.    TECHNIQUE: CT of the head performed without intravenous contrast. The  following dose reduction techniques were utilized: automated exposure  control and/or adjustment of the mA and/or KV according to patient size,  and the use of an iterative reconstruction technique.    CONTRAST: None.    FINDINGS:  There is no acute hemorrhage. The ventricles and subarachnoid spaces are  proportionately enlarged reflecting volume loss.  The gray-white matter  differentiation is preserved. There is a small chronic lacunar infarct in  right cerebellum. There is moderate periventricular hypodensity most  consistent with chronic small vessel ischemic change. There is no mass  effect or midline shift. The basal cisterns are patent. Intracranial  vascular calcifications are present.    No suspicious or aggressive osseous lesion is identified. The imaged  paranasal sinuses and mastoids are clear.         Impression       1.No CT evidence of acute intracranial hemorrhage, herniation, or  hydrocephalus.   2.Moderate chronic small vessel  ischemic changes.      Nelya Ebadirad  06/18/2022 9:37 AM       Signed,  Pilar Plate, NP  3:55 PM 09/24/2022

## 2022-09-25 DIAGNOSIS — Z7189 Other specified counseling: Secondary | ICD-10-CM

## 2022-09-25 DIAGNOSIS — Z515 Encounter for palliative care: Secondary | ICD-10-CM

## 2022-09-25 DIAGNOSIS — I5040 Unspecified combined systolic (congestive) and diastolic (congestive) heart failure: Secondary | ICD-10-CM

## 2022-09-25 DIAGNOSIS — F0390 Unspecified dementia without behavioral disturbance: Secondary | ICD-10-CM

## 2022-09-25 DIAGNOSIS — S7291XA Unspecified fracture of right femur, initial encounter for closed fracture: Secondary | ICD-10-CM

## 2022-09-25 LAB — CBC
Absolute nRBC: 0 10*3/uL (ref <=0.00)
Hematocrit: 36.1 % — ABNORMAL LOW (ref 37.6–49.6)
Hemoglobin: 11.8 g/dL — ABNORMAL LOW (ref 12.5–17.1)
MCH: 33.9 pg — ABNORMAL HIGH (ref 25.1–33.5)
MCHC: 32.7 g/dL (ref 31.5–35.8)
MCV: 103.7 fL — ABNORMAL HIGH (ref 78.0–96.0)
MPV: 11.8 fL (ref 8.9–12.5)
Platelet Count: 85 10*3/uL — ABNORMAL LOW (ref 142–346)
RBC: 3.48 10*6/uL — ABNORMAL LOW (ref 4.20–5.90)
RDW: 12 % (ref 11–15)
WBC: 4.45 10*3/uL (ref 3.10–9.50)
nRBC %: 0 /100 WBC (ref <=0.0)

## 2022-09-25 LAB — BASIC METABOLIC PANEL
Anion Gap: 6 (ref 5.0–15.0)
BUN: 33 mg/dL — ABNORMAL HIGH (ref 9–28)
CO2: 24 mEq/L (ref 17–29)
Calcium: 9.7 mg/dL (ref 7.9–10.2)
Chloride: 113 mEq/L — ABNORMAL HIGH (ref 99–111)
Creatinine: 1.5 mg/dL (ref 0.5–1.5)
GFR: 42.6 mL/min/{1.73_m2} — ABNORMAL LOW (ref >=60.0)
Glucose: 119 mg/dL — ABNORMAL HIGH (ref 70–100)
Potassium: 4.6 mEq/L (ref 3.5–5.3)
Sodium: 143 mEq/L (ref 135–145)

## 2022-09-25 NOTE — OT Eval Note (Signed)
[facility]   Occupational Therapy Attempt Note    Patient:  Rick [name] MRN#:  [id number]  Unit:  [facility] [address] [facility] Room/Bed:  V4098/J1914.B      Occupational Therapy evaluation attempted on 09/25/2022 at 9:03 AM.        OT Order Cancellation Reason: Not clinically indicated at this time (comment required) - Rehab decision (Per RN whom spoke with family, pt has been DEP for ADLs. Pt at his functional baseline.) No further acute OT services indicated at this time.         OT signing-off     RN aware.    Jenkins Rouge, OTD, OTR/L  [place]   Physical Medicine and Rehabilitation   [facility]   [facility]     09/25/2022 9:03 AM

## 2022-09-25 NOTE — Plan of Care (Signed)
NURSING SHIFT NOTE     Patient: Rick [name]  Day: 1      SHIFT EVENTS     Shift Narrative/Significant Events (PRN med administration, fall, RRT, etc.):     -Pt a&ox1 on RA  -Pt is x2 Assist and bed mobility  -Scheduled meds given and tolerated by pt  -PRN ... given  -Call bell within reach     Safety and fall precautions remain in place. Purposeful rounding completed.          ASSESSMENT     Changes in assessment from patient's baseline this shift:    Neuro: No  CV: No  Pulm: No  Peripheral Vascular: No  HEENT: No  GI: No  BM during shift: No, Last BM: Last BM Date: 09/22/22  GU: No   Integ: No  MS: No      Mobility: PMP Activity: Step 3 - Bed Mobility of Distance Walked (ft) (Step 6,7): 0 Feet           Lines     Patient Lines/Drains/Airways Status       Active Lines, Drains and Airways       Name Placement date Placement time Site Days    Peripheral IV 09/23/22 24 G Standard Anterior;Left Forearm 09/23/22  2356  Forearm  1    External Urinary Catheter 09/23/22  1730  --  1                         VITAL SIGNS     Vitals:    09/25/22 1100   BP: 140/70   Pulse: 60   Resp: 17   Temp: 98 F (36.7 C)   SpO2: 97%       Temp  Min: 97.3 F (36.3 C)  Max: 99 F (37.2 C)  Pulse  Min: 53  Max: 81  Resp  Min: 17  Max: 18  BP  Min: 115/57  Max: 168/72  SpO2  Min: 95 %  Max: 99 %      Intake/Output Summary (Last 24 hours) at 09/25/2022 1455  Last data filed at 09/25/2022 1307  Gross per 24 hour   Intake 1160 ml   Output 450 ml   Net 710 ml                  CARE PLAN        Problem: Moderate/High Fall Risk Score >5  Goal: Patient will remain free of falls  Outcome: Progressing  Flowsheets (Taken 09/25/2022 1454)  High (Greater than 13):   HIGH-Consider use of low bed   HIGH-Initiate use of floor mats as appropriate   HIGH-Pharmacy to initiate evaluation and intervention per protocol   HIGH-Apply yellow "Fall Risk" arm band   HIGH-Bed alarm on at all times while patient in bed   HIGH-Visual cue at entrance to patient's  room     Problem: Pain interferes with ability to perform ADL  Goal: Pain at adequate level as identified by patient  Outcome: Progressing  Flowsheets (Taken 09/25/2022 1454)  Pain at adequate level as identified by patient: Assess for risk of opioid induced respiratory depression, including snoring/sleep apnea. Alert healthcare team of risk factors identified.     Problem: Compromised Sensory Perception  Goal: Sensory Perception Interventions  Outcome: Progressing  Flowsheets (Taken 09/25/2022 1454)  Sensory Perception Interventions: Offload heels, Pad bony prominences, Reposition q 2hrs/turn Clock, Q2 hour skin assessment under devices if present  Problem: Compromised Moisture  Goal: Moisture level Interventions  Outcome: Progressing  Flowsheets (Taken 09/25/2022 1454)  Moisture level Interventions: Moisture wicking products, Moisture barrier cream     Problem: Compromised Activity/Mobility  Goal: Activity/Mobility Interventions  Outcome: Progressing  Flowsheets (Taken 09/25/2022 1454)  Activity/Mobility Interventions: Pad bony prominences, TAP Seated positioning system when OOB, Promote PMP, Reposition q 2 hrs / turn clock, Offload heels     Problem: Compromised Nutrition  Goal: Nutrition Interventions  Outcome: Progressing  Flowsheets (Taken 09/25/2022 1454)  Nutrition Interventions: Discuss nutrition at Rounds, I&Os, Document % meal eaten, Daily weights     Problem: Compromised Friction/Shear  Goal: Friction and Shear Interventions  Outcome: Progressing  Flowsheets (Taken 09/25/2022 1454)  Friction and Shear Interventions: Pad bony prominences, Off load heels, HOB 30 degrees or less unless contraindicated, Consider: TAP seated positioning, Heel foams     Problem: Hemodynamic Status: Cardiac  Goal: Stable vital signs and fluid balance  Outcome: Progressing  Flowsheets (Taken 09/25/2022 1454)  Stable vital signs and fluid balance: Assess signs and symptoms associated with cardiac rhythm changes

## 2022-09-25 NOTE — Consults (Signed)
Orthopaedic Surgery Consult Note    I was consulted by ED/medicine for evaluation of the patient for nondisplaced right periprosthetic distal femur fracture.    Chief Complaint: Right knee pain    HPI:  Patient is a 89 y.o. male ambulator with a walker at baseline with multiple medical comorbidities including dementia, heart failure, CAD, hypertension, hyperlipidemia, who presents with acute onset right knee pain after a fall several days prior.  The patient states that he has been having difficulty with ambulation.  He was taken to the emergency department for further evaluation.  Imaging demonstrated a nondisplaced right distal femoral metadiaphyseal periprosthetic fracture.  He was admitted to the hospital for further management.    Patient Active Problem List    Diagnosis Date Noted    Closed fracture of right femur, unspecified fracture morphology, unspecified portion of femur, initial encounter 09/23/2022    HFrEF (heart failure with reduced ejection fraction) 08/27/2022    AKI (acute kidney injury) 08/24/2022    Acute kidney failure 08/24/2022    Stage 3b chronic kidney disease 06/04/2022    Combined forms of age-related cataract of left eye 04/29/2022    Combined forms of age-related cataract of right eye 04/24/2022    URI with cough and congestion 03/30/2022    Chronic bilateral low back pain without sciatica 03/30/2022    Artificial knee joint present 02/17/2022    Benign prostatic hyperplasia 02/17/2022    Fracture of femur 02/17/2022    Hallux valgus (acquired), right foot 02/17/2022    Hypertension 02/17/2022    IgG monoclonal gammopathy of uncertain significance 02/17/2022    Nocturia 02/17/2022    Old myocardial infarction 02/17/2022    Other hammer toe(s) (acquired), left foot 02/17/2022    Other hammer toe(s) (acquired), right foot 02/17/2022    Other spondylosis with myelopathy, cervical region 02/17/2022    Smoldering myeloma 08/25/2021    Thrombocytopenia 04/29/2021    Acute renal failure with  tubular necrosis 04/28/2021    Vascular dementia 04/28/2021    Incarcerated inguinal hernia 04/27/2021    Pulmonary embolism with acute cor pulmonale, unspecified chronicity, unspecified pulmonary embolism type 04/09/2021    Acute pulmonary embolism with acute cor pulmonale, unspecified pulmonary embolism type 04/08/2021    Chronic systolic CHF (congestive heart failure) 10/23/2020    Balance disorder 06/05/2020    Dementia without behavioral disturbance 10/17/2019    Difficulty in walking, not elsewhere classified 10/09/2019    S/P TAVR (transcatheter aortic valve replacement) 08/16/2019    Aortic valve stenosis, etiology of cardiac valve disease unspecified 07/12/2019    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 03/09/2019    Dry cough 03/09/2019    Nonrheumatic aortic valve stenosis 12/23/2018    Vertebrogenic low back pain 09/28/2018    Memory deficits 05/23/2018    Coronary artery disease with history of myocardial infarction without history of CABG 05/23/2018    History of total right knee replacement 03/12/2017    Loose total knee arthroplasty 03/12/2017    Osteoarthritis of left knee 03/12/2017     Medical History[1]   Past Surgical History:   Procedure Laterality Date    COLONOSCOPY, DIAGNOSTIC (SCREENING)      CORONARY ARTERY BYPASS GRAFT  2003     approx year: LIMA to LAD,  SVG to OM 1 & OM 2 of circumflex CA, & SVG to RCA    FRACTURE SURGERY Right 06/2016    femur    HERNIORRHAPHY, INGUINAL Right 04/29/2021  Procedure: OPEN RIGHT HERNIORRHAPHY, INGUINAL WITH MESH;  Surgeon: Towanda Octave, MD;  Location: [facility];  Service: General;  Laterality: Right;    PERCUTANEOUS VALVULOPLASTY - AORTIC N/A 07/12/2019    Procedure: TAVR #2;  Surgeon: Arlyss Queen, MD PhD;  Location: [facility];  Service: Cardiovascular;  Laterality: N/A;  23 HR OB    REPLACEMENT, AORTIC VALVE , EDWARDS SAPIEN N/A 07/12/2019    Procedure: REPLACEMENT, AORTIC VALVE , EDWARDS  SAPIEN -- TAVR Valve:   S3 29mm -- Access: RTF -- IC: Dr. Penny Pia;  Surgeon: Christene Lye, MD;  [facility];  Service: Cardiothoracic;  Laterality: N/A;    RIGHT & LEFT HEART CATH POSSIBLE PCI N/A 05/26/2019    Procedure: RIGHT & LEFT HEART CATH POSSIBLE PCI;  Surgeon: Arlyss Queen, MD PhD;  Location: [facility];  Service: Cardiovascular;  Laterality: N/A;  [id number] scheduled with Maira in dr's office. OP. kr    TOTAL KNEE ARTHROPLASTY Left 2017    TOTAL KNEE ARTHROPLASTY Right 2005      Prescriptions Prior to Admission[2]  Allergies[3]   Social History     Tobacco Use    Smoking status: Never    Smokeless tobacco: Never   Substance Use Topics    Alcohol use: Never      Family History   Problem Relation Age of Onset    No known problems Mother     No known problems Father     No known problems Sister     No known problems Brother         FAMILY HISTORY: No family history of VTE or inflammatory arthropathy.    REVIEW OF SYSTEMS:   CONSTITUTIONAL: No weight loss, weakness or fatigue.   SKIN: No rash or itching.   CARDIOVASCULAR: No chest pain, chest pressure or chest discomfort. No edema.   RESPIRATORY: No shortness of breath.   GASTROINTESTINAL: No nausea, vomiting or diarrhea.   NEUROLOGICAL: No paralysis. No change in bowel or bladder control.   MUSCULOSKELETAL: Positive joint pain.   HEMATOLOGIC: No anemia, bleeding or bruising.   ENDOCRINOLOGIC: No polyuria, no polydipsia, heat/cold intolerance.     Objective:  Vital signs in last 24 hours:  Temp:  [97.3 F (36.3 C)-99 F (37.2 C)] 98 F (36.7 C)  Heart Rate:  [53-81] 65  Resp Rate:  [17-18] 18  BP: (139-168)/(60-75) 139/60  General: NAD, well-nourished, well-developed.  Psych: Alert and oriented, normal mood.  HEENT: NC/AT  Cardiac: RRR  Respiratory: Non-labored breathing  Skin: intact  Musculoskeletal: Right lower extremity  -No open wounds  -Mildly tender about the right distal thigh/knee  -Minimal pain with knee range of  motion  -Motor intact EHL/FHL/GS/TA  -Sensory intact to light touch S/S/SP/DP/T  -Warm and well-perfused, brisk up refill less than 2 seconds to all toes    Results       Procedure Component Value Units Date/Time    Basic Metabolic Panel [[id number]]  (Abnormal) Collected: 09/25/22 0329    Specimen: Blood, Venous Updated: 09/25/22 0414     Glucose 119 mg/dL      BUN 33 mg/dL      Creatinine 1.5 mg/dL      Calcium 9.7 mg/dL      Sodium 914 mEq/L      Potassium 4.6 mEq/L      Chloride 113 mEq/L      CO2 24 mEq/L      Anion Gap 6.0  GFR 42.6 mL/min/1.73 m2     CBC without Differential [[id number]]  (Abnormal) Collected: 09/25/22 0329    Specimen: Blood, Venous Updated: 09/25/22 0403     WBC 4.45 x10 3/uL      Hemoglobin 11.8 g/dL      Hematocrit 34.1 %      Platelet Count 85 x10 3/uL      MPV 11.8 fL      RBC 3.48 x10 6/uL      MCV 103.7 fL      MCH 33.9 pg      MCHC 32.7 g/dL      RDW 12 %      nRBC % 0.0 /100 WBC      Absolute nRBC 0.00 x10 3/uL             Imaging Review  Radiographs of the right femur demonstrates prior right total knee arthroplasty and intramedullary nail with multiple interlocking screws and cerclage wires.  There is a healed right diaphyseal femur fracture.    CT scan of the right knee without contrast demonstrates a nondisplaced fracture of the anterior cortex of the right distal femoral metadiaphysis.  The previous hardware is noted within the right lower extremity.    Assessment/Plan:  Patient is a 89 y.o. male who has a nondisplaced unicortical right distal femoral metadiaphyseal periprosthetic fracture.  He is status post right total knee arthroplasty and retrograde intramedullary nail that bypasses the fracture site.    I had a lengthy discussion today with the patient regarding his right knee injury and further management.  I discussed with the patient that given his minimal symptoms with knee range of motion as well as prior hardware that bypasses the fracture site, I believe that this  fracture can be treated nonoperatively.  We will plan for him to be weightbearing as tolerated on the right lower extremity.  He should have a PT/OT evaluation.  DVT prophylaxis as indicated.  Further medical care per the primary team.    All of his questions were answered to his satisfaction.  Patient may follow-up in the office in 4 to 6 weeks for reevaluation.    Thank you for this consult.  Please do not hesitate to contact me with any questions or concerns.      Eugene Garnet, MD  Orthopaedic Surgery, Sports Medicine  [facility]         [1]   Past Medical History:  Diagnosis Date    Arrhythmia 2021    RBBB    Atrial enlargement, bilateral Echo result 04/04/19    Bilateral cataracts     Son reports pt has the ability to lay flat for 45+min without CP or SOB as reported on 05/14/22 for DOS  06/04/22    CAD (coronary artery disease) 05/28/19 Cath results.    Severe 3V CAD.  Patent LIMA to LAD, patent SVG to OM 1 and OM 2 of circumflex CA, & patent SVG to RCA.    Chronic combined systolic and diastolic heart failure 310/21 OV Dr. Penny Pia    EF=30%    Dementia 04/2014    Difficulty walking 06/2016    Post surgery of lt knee, rt femur/ uses walker and wheelchair for long distances prn    Disorder of prostate     Dyslipidemia 310/21 OV Dr. Penny Pia    Dyspnea on exertion 02/2018    occas per son report on 05/14/22, doing better    Hearing loss  mild . does not like to wear/ bilat    HTN (hypertension) 310/21 OV Dr. Penny Pia    Son reports unaware of htn dx as reported on 3/114/24    Low back pain     muscular    Primary cardiomyopathy 11/2017    Urinary incontinence     occas  2-3 monthly at qhs or in am    VTE (venous thromboembolism) Feb 2023    In lungs, combined with bronchitis   [2]   Medications Prior to Admission   Medication Sig Dispense Refill Last Dose    acetaminophen (TYLENOL) 500 MG tablet Take 1 tablet (500 mg) by mouth as needed for Pain       empagliflozin (JARDIANCE) 10 MG tablet  Take 1 tablet (10 mg) by mouth daily 90 tablet 3     furosemide (LASIX) 20 MG tablet Take 1 tablet (20 mg) by mouth daily (Patient taking differently: Take 1 tablet (20 mg) by mouth daily Taking as needed) 90 tablet 1     Multiple Vitamin (multivitamin) capsule Take 1 capsule by mouth daily       sacubitril-valsartan (ENTRESTO) 24-26 MG Tab per tablet Take 0.5 tablets by mouth 2 (two) times daily 90 tablet 3     spironolactone (ALDACTONE) 25 MG tablet Take 0.5 tablets (12.5 mg) by mouth daily 90 tablet 0     tamsulosin (FLOMAX) 0.4 MG Cap Take 1 capsule (0.4 mg) by mouth every evening 90 capsule 3     Vitamin D, Cholecalciferol, 50 MCG (2000 UT) Cap Take 2,000 Units by mouth Once every Monday, Wednesday and Friday morning       [DISCONTINUED] rosuvastatin (CRESTOR) 20 MG tablet        docusate sodium (COLACE) 100 MG capsule Take 1 capsule (100 mg) by mouth 2 (two) times daily      [3] No Known Allergies

## 2022-09-25 NOTE — SLP Eval Note (Signed)
[facility]  Speech and Language Therapy Bedside Swallow Evaluation     Patient:  Rick [name] MRN#:  [id number]  Unit:  [facility] [address] [facility] Room/Bed:  V4098/J1914.A    Time of Treatment:   Time Calculation  SLP Received On: 09/25/22  Start Time: 1200  Stop Time: 1220  Time Calculation (min): 20 min    Consult received for Rick [name] for SLP Bedside Swallow Evaluation and Treatment.    Medical Diagnosis: Closed fracture of right femur, unspecified fracture morphology, unspecified portion of femur, initial encounter [S72.91XA]    History of Present Illness: Rick [name] is a 89 y.o. male admitted on 09/23/2022 with   Problem List[1]  a PMHx of dementia, MGUS, HFrEF with EF 30%, CAD, HTN, HLD, intermittent Mobitz type II AV block who presented with right knee pain.     Patient presents in the setting of right knee pain after recent fall several days ago.  He normally ambulates with a walker.  He has been having extreme difficulty walking and bearing weight with his walker since the fall.  Family initially believed that his symptoms might improve with time but decided to bring him to the ED when he remained unable to walk.  Patient reports no pain at rest however any movement of the knee causes significant pain.     In the ED, patient intermittently bradycardic with vital signs otherwise within normal limits.  No labs were obtained in the ED today.  CT right knee without contrast showed nondisplaced hairline fracture of the right distal femoral metadiaphysis, healed fracture of the femoral diaphysis status post ORIF, chronic fragmentation of the superior medial pole of the patella.       Past Medical/Surgical History:  Medical History[2]   Past Surgical History:   Procedure Laterality Date    COLONOSCOPY, DIAGNOSTIC (SCREENING)      CORONARY ARTERY BYPASS GRAFT  2003     approx year: LIMA to LAD,  SVG to OM 1 & OM 2 of circumflex CA, & SVG to RCA    FRACTURE  SURGERY Right 06/2016    femur    HERNIORRHAPHY, INGUINAL Right 04/29/2021    Procedure: OPEN RIGHT HERNIORRHAPHY, INGUINAL WITH MESH;  Surgeon: Towanda Octave, MD;  Location: [facility];  Service: General;  Laterality: Right;    PERCUTANEOUS VALVULOPLASTY - AORTIC N/A 07/12/2019    Procedure: TAVR #2;  Surgeon: Arlyss Queen, MD PhD;  Location: [facility];  Service: Cardiovascular;  Laterality: N/A;  23 HR OB    REPLACEMENT, AORTIC VALVE , EDWARDS SAPIEN N/A 07/12/2019    Procedure: REPLACEMENT, AORTIC VALVE , EDWARDS SAPIEN -- TAVR Valve:   S3 29mm -- Access: RTF -- IC: Dr. Penny Pia;  Surgeon: Christene Lye, MD;  Location: [facility] HE[facility] OR;  Service: Cardiothoracic;  Laterality: N/A;    RIGHT & LEFT HEART CATH POSSIBLE PCI N/A 05/26/2019    Procedure: RIGHT & LEFT HEART CATH POSSIBLE PCI;  Surgeon: Arlyss Queen, MD PhD;  Location: [facility];  Service: Cardiovascular;  Laterality: N/A;  [id number] scheduled with Maira in dr's office. OP. kr    TOTAL KNEE ARTHROPLASTY Left 2017    TOTAL KNEE ARTHROPLASTY Right 2005         History/Current Status:  Current Status  Respiratory Status: room air  Behavior/Mental Status: Awake/alert, Able to follow directions, Cooperative  Nutrition: oral  Diet Prior to Study: Regular (IDDSI RG7)    Subjective: Patient  is agreeable to participation in the therapy session. Nursing clears patient for therapy. Patient's medical condition is appropriate for Speech therapy intervention at this time.    Objective:  Observation of Patient/Vital Signs:  Patient is in bed with telemetry in place.    Oral Motor Skills:  Engineer, maintenance (IT) Skills: exceptions to [facility]  Oral Motor Impairments: coordination, strength, dysphonia, dysarthria    Deglutition Skills:  Deglutition Skills  Position: upright 90 degrees  Food(s) Tested: Thin (IDDSI TN0), Pureed (IDDSI P4), Regular (IDDSI RG7)  Oral Stage: bolus formation/control reduced, slow but effective, suspect premature  spillage, bolus formation/control reduced, ineffective, suspect AP propulsion reduced  Pharyngeal Stage: suspect delayed response, suspect reduced laryngeal elevation through palpation, delayed strong cough    Assessment:     Pt is alert/ verbal , confused , unable to follow commands.  Pt presents with mod oral dysphagia with solid trials and minimal oral pharyngeal dysphagia with pureed trials. Oral phase was marked by prolonged mastication, poor bolus formation and mod oral stasis. Pt required liquid assist to partially clear bolus.. Pt tolerated mildly thickened liquid via spoon and straw presentation without overt s/s aspiration or penetration. Pt coughed in response to thin liquid trial 10% of the time.       Goals:  Pt will tolerate IDDSI Pureed - PU4  and Mildly thick  MT2 without s/s aspiration or penetration x 48 hours        Plan/Recommendations:     Follow up treatments: diet monitoring, strategies training     Diet Solids Recommendation: Pureed (IDDSI P4)  Diet Liquids Recommendations: Mildly Thick (IDDSI MT2)  Precautions/Compensations: Awake/alert, Upright 90 degrees for all oral intake, 45 degrees upright after meals, Alternate solids and liquids, Swallow multiple times per bite/sip  Recommendation Discussed With: : Patient, Nurse  SLP Frequency Recommended: 2-3x/wk  Administration of Medications: PO / crushed in applesauce   D/w RN diet texture recommendation, RN authorized Clinical research associate to place a diet order in Epic      09/25/2022  Presley Raddle MS [facility] SLP   09/25/2022 2:40 PM  [phone]       [1]   Patient Active Problem List  Diagnosis    Memory deficits    Coronary artery disease with history of myocardial infarction without history of CABG    Vertebrogenic low back pain    Nonrheumatic aortic valve stenosis    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    Dry cough    Aortic valve stenosis, etiology of cardiac valve disease unspecified    S/P TAVR  (transcatheter aortic valve replacement)    Difficulty in walking, not elsewhere classified    Dementia without behavioral disturbance    Balance disorder    Chronic systolic CHF (congestive heart failure)    Acute pulmonary embolism with acute cor pulmonale, unspecified pulmonary embolism type    Pulmonary embolism with acute cor pulmonale, unspecified chronicity, unspecified pulmonary embolism type    Incarcerated inguinal hernia    Acute renal failure with tubular necrosis    Vascular dementia    Thrombocytopenia    Smoldering myeloma    Artificial knee joint present    Benign prostatic hyperplasia    Fracture of femur    Hallux valgus (acquired), right foot    History of total right knee replacement    Hypertension    IgG monoclonal gammopathy of uncertain significance    Loose total knee arthroplasty  Nocturia    Old myocardial infarction    Osteoarthritis of left knee    Other hammer toe(s) (acquired), left foot    Other hammer toe(s) (acquired), right foot    Other spondylosis with myelopathy, cervical region    URI with cough and congestion    Chronic bilateral low back pain without sciatica    Combined forms of age-related cataract of right eye    Combined forms of age-related cataract of left eye    Stage 3b chronic kidney disease    AKI (acute kidney injury)    Acute kidney failure    HFrEF (heart failure with reduced ejection fraction)    Closed fracture of right femur, unspecified fracture morphology, unspecified portion of femur, initial encounter   [2]   Past Medical History:  Diagnosis Date    Arrhythmia 2021    RBBB    Atrial enlargement, bilateral Echo result 04/04/19    Bilateral cataracts     Son reports pt has the ability to lay flat for 45+min without CP or SOB as reported on 05/14/22 for DOS  06/04/22    CAD (coronary artery disease) 05/28/19 Cath results.    Severe 3V CAD.  Patent LIMA to LAD, patent SVG to OM 1 and OM 2 of circumflex CA, & patent SVG to RCA.    Chronic combined systolic and  diastolic heart failure 310/21 OV Dr. Penny Pia    EF=30%    Dementia 04/2014    Difficulty walking 06/2016    Post surgery of lt knee, rt femur/ uses walker and wheelchair for long distances prn    Disorder of prostate     Dyslipidemia 310/21 OV Dr. Penny Pia    Dyspnea on exertion 02/2018    occas per son report on 05/14/22, doing better    Hearing loss     mild . does not like to wear/ bilat    HTN (hypertension) 310/21 OV Dr. Penny Pia    Son reports unaware of htn dx as reported on 3/114/24    Low back pain     muscular    Primary cardiomyopathy 11/2017    Urinary incontinence     occas  2-3 monthly at qhs or in am    VTE (venous thromboembolism) Feb 2023    In lungs, combined with bronchitis

## 2022-09-25 NOTE — Progress Notes (Signed)
[facility]  PROGRESS NOTE      Patient: Rick [name]  Date: 09/25/2022   LOS: 1 Days  Admission Date: 09/23/2022   MRN: [id number]  Attending: Buelah Manis, MD  When on service as the attending, please contact me on Epic Secure Chat from 7 AM - 7 PM for non-urgent issues. For urgent matters use XTend page from 7 AM - 7 PM.       ASSESSMENT/PLAN     Rick [name] is a 89 y.o. male admitted with Closed fracture of right femur, post fall.  Discussed with orthopedic surgeon Dr. Primitivo Gauze who recommended obtaining right femur x-ray and will review chart.    Addendum:   -Right knee immobilized with no weightbearing, orthopedic consulted for close fracture of right femur, x-ray pending and awaiting recommendations.  Patient with failure to thrive, poor oral intake, will get nutritionist to see, consider starting low-dose mirtazapine, pain management.    Patient Active Hospital Problem List:  Closed fracture of right femur 09/23/2022      Imaging reviewed and read by radiologist including CT right knee without contrast which showed nondisplaced hairline fracture of the right distal femoral metadiaphysis, healed fracture of the femoral diaphysis status post ORIF, chronic fragmentation of the superior medial pole of the patella.  -Discussed with Ortho who requested Xray PA/LAT and will review  -PT OT on board with recs for SNF  -Continue Non weightbearing on right knew  -Continue right knee immobilizer  -Continue pain management and bowel regimen       Bradycardia  Intermittent Mobitz type II AV block  As noted, patient was evaluated by cardiology and EP on prior admissions, pace maker insertion was recommended but patient's son decided against it given patient's advanced age and dementia.  Memantine and donepezil were discontinued because of bradycardia.  -Consult placed for Palliative care    -patient seen by cardiology, no intervention recommended  -Patient continue to have pauses  2-2.8 seconds. No BB  -Monitor patient on Tele     Chronic combined systolic and diastolic heart failure with reduced EF (stable volume status)  Known cardiomyopathy with improved EF from 25-30% to of 41% on echo done 08/2022  -Continue Entresto and Jardiance  -Continue with spironolactone  -Close monitoring of renal function    Coronary artery disease s/p CABG ( Denied CP/sob)  -Continue statin     Dementia  -Donepezil and Memantine discontinued during previous admission  -Monitor for fall     Chronic thrombocytopenia  -Follow daily CBC      History of PE  -Patient is not on anticoagulation    Enlarged prostate  -Continue Flomax 0.4 mg daily         Nutrition: Heart healthy diet             Recent Labs     09/25/22  0329 09/24/22  0401   Platelet Count 85* 81*     Diagnosis: Moderate Thrombocytopenia               Recent Labs   Lab 09/25/22  0329 09/24/22  0401   Hemoglobin 11.8* 11.6*   Hematocrit 36.1* 36.6*   MCV 103.7* 105.8*   WBC 4.45 3.32   Platelet Count 85* 81*         Anemia Diagnosis: Chronic: Anemia of Chronic Disease    Code Status: NO CPR - SUPPORT OK    Dispo: Likely SNF in 24-48 hours    Family Contact:  none    DVT Prophylaxis:   Current Facility-Administered Medications (Includes Only Anticoagulants, Misc. Hematological)   Medication Dose Route Last Admin    enoxaparin (LOVENOX) syringe 30 mg  30 mg Subcutaneous 30 mg at 09/25/22 0850        CHART  REVIEW & DISCUSSION     The following chart items were reviewed as of 4:41 PM on 09/25/22:  [x]  Lab Results [x]  Imaging Results   [x]  Problem List  [x]  Current Orders [x]  Current Medications  []  Allergies  []  Code Status []  Previous Notes   []  SDoH    The management and plan of care for this patient was discussed with the following specialty consultants:  []  Cardiology  []  Gastroenterology                 []  Infectious Disease  []  Pulmonology []  Neurology                []  Nephrology  []  Neurosurgery [x]  Orthopedic Surgery  []  Heme/Onc  []  General  Surgery []  Psychiatry                                   []  Palliative    SUBJECTIVE     Rick [name] states he is feeling much better, pain is manage, no chest pain, no shortness of breath, no palpitations, feeling hungry, had no other complaint    MEDICATIONS     Current Facility-Administered Medications   Medication Dose Route Frequency    docusate sodium  100 mg Oral BID    empagliflozin  10 mg Oral Daily    enoxaparin  30 mg Subcutaneous Daily    rosuvastatin  10 mg Oral QHS    sacubitril-valsartan  0.5 tablet Oral BID    spironolactone  12.5 mg Oral Daily    tamsulosin  0.4 mg Oral QPM     PHYSICAL EXAM     Vitals:    09/25/22 1500   BP: 139/60   Pulse: 65   Resp: 18   Temp: 98 F (36.7 C)   SpO2: 97%     Temperature: Temp  Min: 97.3 F (36.3 C)  Max: 99 F (37.2 C)  Pulse: Pulse  Min: 53  Max: 81  Respiratory: Resp  Min: 17  Max: 18  Non-Invasive BP: BP  Min: 115/57  Max: 168/72  Pulse Oximetry SpO2  Min: 95 %  Max: 99 %    Intake and Output Summary (Last 24 hours) at Date Time    Intake/Output Summary (Last 24 hours) at 09/25/2022 1641  Last data filed at 09/25/2022 1617  Gross per 24 hour   Intake 1160 ml   Output 1050 ml   Net 110 ml     GEN APPEARANCE: Normal affect, hx of dementia, able to answer some questions;     HEENT: PERLA; EOMI; Conjunctiva Clear  NECK: Supple; No bruits  CVS: RRR, S1, S2; No M/G/R  LUNGS: CTAB; No Wheezes; No Rhonchi: No rales  ABD: Soft; No TTP; + Normoactive BS  EXT: No edema; Pulses 2+ and intact, right knew in immobilizer    NEURO: CN 2-12 intact; No Focal neurological deficits  MENTAL STATUS: A+OX1, able to move LE weakly, uses a walker at home    LABS     Recent Labs   Lab 09/25/22  0329 09/24/22  0401   WBC 4.45 3.32  RBC 3.48* 3.46*   Hemoglobin 11.8* 11.6*   Hematocrit 36.1* 36.6*   MCV 103.7* 105.8*   Platelet Count 85* 81*     Recent Labs   Lab 09/25/22  0329 09/24/22  0401   Sodium 143 141   Potassium 4.6 4.8   Chloride 113* 113*   CO2 24 24   BUN 33* 37*    Creatinine 1.5 1.6*   Glucose 119* 80   Calcium 9.7 9.7     Recent Labs   Lab 09/24/22  0401   ALT 12   AST (SGOT) 23   Bilirubin, Total 0.8   Albumin 3.5   Alkaline Phosphatase 58     Microbiology Results (last 15 days)       ** No results found for the last 360 hours. **           RADIOLOGY     XR Femur Right AP And Lateral    Result Date: 09/24/2022  Healed fracture of the right distal femoral diaphysis status post ORIF with good alignment. The hairline fracture distal to this noted on CT scan is not visible on x-ray. Lanier Ensign, MD 09/24/2022 5:10 PM    CT Knee Right without Contrast    Result Date: 09/23/2022  1.Nondisplaced hairline fracture of the right distal femoral metadiaphysis anterolaterally; there may be some early callus formation at this fracture site. 2.Healed fracture of the femoral diaphysis status post ORIF, partially covered on this study. 3.Chronic fragmentation of the superomedial pole of the patella. 4.Intact right total knee arthroplasty. 5.Osteoporosis. Lanier Ensign, MD 09/23/2022 12:38 PM   Echo Results       None          Results for orders placed or performed during the hospital encounter of 06/18/22   CT Head WO Contrast    Narrative    HISTORY: Unwitnessed fall. Posttraumatic headache and weakness. Patient is  on anticoagulation.    COMPARISON: Outside Brain MRI on 09/03/2020.    TECHNIQUE: CT of the head performed without intravenous contrast. The  following dose reduction techniques were utilized: automated exposure  control and/or adjustment of the mA and/or KV according to patient size,  and the use of an iterative reconstruction technique.    CONTRAST: None.    FINDINGS:  There is no acute hemorrhage. The ventricles and subarachnoid spaces are  proportionately enlarged reflecting volume loss.  The gray-white matter  differentiation is preserved. There is a small chronic lacunar infarct in  right cerebellum. There is moderate periventricular hypodensity most  consistent with chronic  small vessel ischemic change. There is no mass  effect or midline shift. The basal cisterns are patent. Intracranial  vascular calcifications are present.    No suspicious or aggressive osseous lesion is identified. The imaged  paranasal sinuses and mastoids are clear.         Impression       1.No CT evidence of acute intracranial hemorrhage, herniation, or  hydrocephalus.   2.Moderate chronic small vessel ischemic changes.      Nelya Ebadirad  06/18/2022 9:37 AM       Signed,  Buelah Manis, MD  4:41 PM 09/25/2022

## 2022-09-25 NOTE — Progress Notes (Signed)
NURSING SHIFT NOTE     Patient: Rick [name]  Day: 1      SHIFT EVENTS     Shift Narrative/Significant Events (PRN med administration, fall, RRT, etc.):   Pulse still brady, asymptomatic, telly expired , removed, med given and tolerated, fluid encouraged, repositioned for skin integrity and comfort, pt restinmg well, will keep monitoring    Safety and fall precautions remain in place. Purposeful rounding completed.          ASSESSMENT     Changes in assessment from patient's baseline this shift:    Neuro: No  CV: No  Pulm: No  Peripheral Vascular: No  HEENT: No  GI: No  BM during shift: No, Last BM: Last BM Date: 09/22/22  GU: No   Integ: {yes:21351::"No"}  MS: {yes:21351::"No"}    Pain: {Pain at site:39313::"None"}  Pain Interventions: {RELIEVING FACTORS:28901}  Medications Utilized: {Meds; pain:60255}    Mobility: PMP Activity: Step 3 - Bed Mobility of Distance Walked (ft) (Step 6,7): 0 Feet           Lines     Patient Lines/Drains/Airways Status       Active Lines, Drains and Airways       Name Placement date Placement time Site Days    Peripheral IV 09/23/22 24 G Standard Anterior;Left Forearm 09/23/22  2356  Forearm  1    External Urinary Catheter 09/23/22  1730  --  2                         VITAL SIGNS     Vitals:    09/25/22 2332   BP: 118/56   Pulse: (!) 42   Resp: 16   Temp: 98.4 F (36.9 C)   SpO2: 95%       Temp  Min: 97.3 F (36.3 C)  Max: 99 F (37.2 C)  Pulse  Min: 42  Max: 75  Resp  Min: 16  Max: 18  BP  Min: 114/56  Max: 147/73  SpO2  Min: 95 %  Max: 97 %      Intake/Output Summary (Last 24 hours) at 09/25/2022 2333  Last data filed at 09/25/2022 1834  Gross per 24 hour   Intake 1280 ml   Output 1050 ml   Net 230 ml            ***Remove Oxygen Weaning section if not applicable.***  OXYGEN WEANING     Stable to wean?: {YES/NO:21936::"Yes"}  Attempted to wean?: {YES/NO:21936::"Yes"}     Symptoms:     Oxygen saturation at rest maintained at:  ***% at {oxygen delivery:30093::"room air"}           Oxygen saturation with ambulation maintained at:  ***% at {oxygen delivery:30093::"room air"}          Patient self-proning?: {yes no:315493}     Patient using incentive spirometer?: {yes no:315493}   Amount achieved on incentive spirometer?: No data recorded      CARE PLAN

## 2022-09-25 NOTE — Progress Notes (Signed)
CM made referral for SNF and will follow up with family on d/c planning and choice.  CM will continue to follow and coordinate all d/c needs.

## 2022-09-25 NOTE — Progress Notes (Signed)
Orthopaedics    Contacted by the ED regarding this patient.  In summary, this is a 89 year old male with multiple medical comorbidities who has had progressive difficulty with ambulation with his walker after a recent fall.  He has been complaining of right knee pain with weight bearing.  Of note, he has had multiple surgeries to his right knee including total knee arthroplasty and placement of an intramedullary nail.  CT scan was obtained, which demonstrate a nondisplaced fracture of the anterior cortex of the right distal femoral metadiaphysis.    Images reviewed - agree with read as well as healed right femoral diaphysis fracture.    Recommend nonweight bearing right lower extremity for now and then progressive weight bearing as tolerated in 1-2 weeks.  The patient has had previous surgeries that bypass the fracture with intramedullary nail.  The patient may follow-up as an outpatient with Dr. Calla Kicks at [facility].    Full consult note to follow if patient is admitted.  Otherwise plan for outpatient follow-up.      Teodoro Spray, MD  [facility]  Orthopaedic Surgery, Sports Medicine

## 2022-09-25 NOTE — Consults (Signed)
Nutritional Support Services  Nutrition Assessment    Rick [name] 89 y.o. male   MRN: [id number]    Summary of Nutrition Recommendations:    Continue current diet at this time, recommend SLP consult to determine appropriate consistency  Ensure plus HP qDay to  help meet needs. Each Ensure Plus High Protein provides 350 kcal and 20 gm protein.  Monitor wt trends  Monitor bowel function  -----------------------------------------------------------------------------------------------------------------                                                        Assessment Data:   Referral Source:consult  Reason for Referral: poor PO intake    Nutrition:   Pt seen at bedside. A/o x 1, family at bedside. Family states pt with good PO intake while admitted. 100% of breakfast tray consumed. Family states wt stable around 150#. NKFA. No issues with n/v/c/d, chewing/swallowing. Pt of advanced age, suspect NFPE findings related to decreased functional status and aging vs. Nutritional concerns at good PO intake currently not no recent significant wt changes. Spoke with RN- needs assist with meals but doing well nutritionally. Did have some swallowing difficulties with meds, recommend SLP consult.     Learning Needs: Encouraged PO intake and focus on nutrient dense items if appetite decrease, goal of wt maintenance.     Hospital Admission: 89 y.o. male admitted with Closed fracture of right femur, post fall.  Discussed with orthopedic surgeon Dr. Primitivo Gauze who recommended obtaining right femur x-ray and will review chart.     Medical Hx:  has a past medical history of Arrhythmia (2021), Atrial enlargement, bilateral (Echo result 04/04/19), Bilateral cataracts, CAD (coronary artery disease) (05/28/19 Cath results.), Chronic combined systolic and diastolic heart failure (310/21 OV Dr. Penny Pia), Dementia (04/2014), Difficulty walking (06/2016), Disorder of prostate, Dyslipidemia (310/21 OV Dr. Penny Pia), Dyspnea on  exertion (02/2018), Hearing loss, HTN (hypertension) (310/21 OV Dr. Penny Pia), Low back pain, Primary cardiomyopathy (11/2017), Urinary incontinence, and VTE (venous thromboembolism) (Feb 2023).    PSH: has a past surgical history that includes Right & Left Heart Cath Possible PCI (N/A, 05/26/2019); Total knee arthroplasty (Left, 2017); Total knee arthroplasty (Right, 2005); Coronary artery bypass graft (2003); Fracture surgery (Right, 06/2016); Percutaneous Valvuloplasty - AORTIC (N/A, 07/12/2019); REPLACEMENT, AORTIC VALVE , EDWARDS SAPIEN (N/A, 07/12/2019); HERNIORRHAPHY, INGUINAL (Right, 04/29/2021); and COLONOSCOPY, DIAGNOSTIC (SCREENING).     Orders Placed This Encounter   Procedures    Adult diet Therapeutic/ Modified; Solid; Regular (IDDSI level 7); Thin (IDDSI level 0)      09/23/22 1741 09/23/22 2100 09/24/22 0537   Intake (mL)   Percent Meal Consumed (%) 0% 75% 75%   Data Collected by  --  Clinical Technician Clinical Technician      09/24/22 1330 09/24/22 1740 09/25/22 1055   Intake (mL)   Percent Meal Consumed (%) 100% 75% 100%   Data Collected by Clinical Technician Clinical Technician Clinical Technician     ANTHROPOMETRIC  Height: 172.7 cm (5\' 8" )  Weight: 71.3 kg (157 lb 3.2 oz)  Weight Change: -0.25  IBW/kg (Calculated) Male: 70.02 kg  Body mass index is 23.9 kg/m.     Weight History Summary:    Weight Monitoring     Weight Weight Method   07/21/2021 68.402 kg     09/23/2021 67.586 kg  11/26/2021 69.31 kg     02/17/2022 75.751 kg     03/09/2022 74.39 kg     03/12/2022 74.753 kg     04/30/2022 70.308 kg     05/14/2022 70.308 kg     06/10/2022 72.984 kg     06/18/2022 72.576 kg  Bed Scale    08/24/2022 --  Stated     72.122 kg  Standing Scale     70 kg     08/25/2022 70 kg  Standing Scale    08/26/2022 69.9 kg  Bed Scale     70.7 kg  Standing Scale    08/27/2022 71.2 kg  Bed Scale    09/10/2022 71.124 kg     09/16/2022 71.668 kg     09/17/2022 71.578 kg     09/23/2022 71.668 kg  Bed Scale     71.668 kg     09/24/2022 71.487  kg  Bed Scale    09/25/2022 71.305 kg  Bed Scale    Wt appears stable over past year    ESTIMATED NEEDS  Total Daily Energy Needs: 1639.9 to 1782.5 kcal  Method for Calculating Energy Needs: 23 kcal - 25 kcal per kg  at 71.3 kg (Actual body weight)  Rationale: non icu, BMI     Total Daily Protein Needs: 71.3 to 92.69 g  Method for Calculating Protein Needs: 1 g - 1.3 g per kg at 71.3 kg (Actual body weight)  Rationale: non icu, BMI, GFR    Total Daily Fluid Needs: 1639.9 to 1782.5 ml  Method for Calculating Fluid Needs: 1 ml per kcal energy = 1639.9 to 1782.5 kcal  Rationale: or per team    Pertinent Medications:  Current Facility-Administered Medications   Medication Dose Route Frequency    docusate sodium  100 mg Oral BID    empagliflozin  10 mg Oral Daily    enoxaparin  30 mg Subcutaneous Daily    rosuvastatin  10 mg Oral QHS    sacubitril-valsartan  0.5 tablet Oral BID    spironolactone  12.5 mg Oral Daily    tamsulosin  0.4 mg Oral QPM     PRN meds given in the past 48 hours: none    Pertinent labs:  Recent Labs   Lab 09/25/22  0329 09/24/22  0401   Sodium 143 141   Potassium 4.6 4.8   Chloride 113* 113*   CO2 24 24   BUN 33* 37*   Creatinine 1.5 1.6*   Glucose 119* 80   Calcium 9.7 9.7   GFR 42.6* 39.4*   Bilirubin, Total  --  0.8   AST (SGOT)  --  23   ALT  --  12   Alkaline Phosphatase  --  58     Allergies[1]    Physical Assessment: 8/9  Head: temple region: slight depression with decrease in muscle tone/resistance (moderate muscle loss - temporalis), orbital region: hollow look, depressions, dark circles, loose skin, significant decrease in bounce back of fat pads (severe fat loss), and buccal region: slight depression, somewhat sunken appearance, flat cheeks, decrease in bounce back of fat pads (moderate fat loss)  Upper Body: clavicle bone region: some protrusion of the clavicle with decrease in muscle tone/resistance (moderate muscle loss - pectoralis major), shoulder and acromion bone region: slight  protrusion of acromion process, decrease in muscle tone/resistance (mild muscle loss - deltoid), upper arm region: some fat in pinch between fingers, but not ample (mild fat loss), and dorsal hand region:  slight depression, decrease in muscle tone/resistance (mild muscle loss - interosseous)  Lower Body: anterior thigh and patellar region: slight depression along inner/outer thigh, patella slightly prominent, feel decrease in muscle tone/resistance in quadriceps to the patella (mild muscle loss - quadriceps) and posterior calf region: some shape to the bulb, but not well-developed, decrease in muscle tone/resistance (mild muscle loss - gastrocnemius)  Edema: edema: no sign of fluid accumulation   Skin: WDL  GI function: soft; LBM 8/6                                                              Nutrition Diagnosis      No nutritional diagnosis at this time                                                              Intervention     Nutrition recommendation - Please refer to top of note                                                             Monitoring/Evaluation     Goals:   Patient to meet >75% of estimated needs through meals and ONS on f/u- new    Nutrition Risk Level: Moderate (will follow up at least 1 time per week and PRN)      S. Allena Katz MS, RD, CNSC  Clinical Dietitian       [1] No Known Allergies

## 2022-09-25 NOTE — Progress Notes (Signed)
CM made several calls to both son and daughter in law at [phone], [phone], and [phone].  CM left VM to discuss D/C Planning and SNF Choice.  CM will continue to follow and coordinate all d/c needs.

## 2022-09-25 NOTE — Consults (Signed)
[facility] Palliative Medicine & Comprehensive Care Consult   Service Spectralink: [phone]  Mon-Fri 9a-4p        Palliative Care Consult   Date Time: 09/25/22 11:22 AM   Patient Name: Rick [name], Rick [name]   Location: R5188/C1660.B   Attending Physician: Delaine Lame, Casimiro Needle   Primary Care Physician: Erasmo Score, MD   Consulting Provider: Hermelinda Dellen, MD   Consulting Service: Palliative Medicine and Comprehensive Care  Consult request from Delaine Lame, Casimiro Needle to see patient regarding:   Reason for Referral: Symptom (dyspnea, nausea, anxiety, fatigue, etc.); Pain     Palliative Diagnosis Category: Neurology (CVA/Dementia...)  Patient Type: Return     Assessment & Plan  Assessment & Plan   Impression      Rick [name] is a 89 y.o. male with significant PMH of moderate dementia, bradycardia with intermittent Mobitz [name] AV block, chronic combined CHF with EF 41%, CAD s/p CABG who presents s/p fall, found to have right nondisplaced hairline femoral fracture. Palliative Care consulted in the context of goals of care, advance care planning.     #Moderate dementia  #Right nondisplaced hairline femoral fracture  #Bradycardia with intermittent Mobitz [name] AV block  #History of chronic combined CHF with EF 41%, CAD s/p CABG  #Advance care planning/goals of care  #Palliative Care by specialist    Estimated Prognosis: Unclear  Thank you for allowing [place] to participate in the care of this patient.    Recommendations   1. Goals of Care:      #Capacity: patient without the medical capacity necessary to make his own decisions towards restorative care vs comfort/hospice care     #Medical decision maker(s): MPOA document noted. MPOA #1 son Rick [name], MPOA #2 daughter-in-law Rick [name].      #Goals discussed:  - Discussed case with MPOA #2 DIL Debra as MPOA #1 son was unavailable.   - Medical update given. Discussed current hospitalization for fall, found to have hairline femoral fracture with PT  recommending SNF.   - Discussed continued disease-directed treatments for quantity of life. Discussed a more comfort-focused approach to care focusing on quality of life, discussed hospice philosophy.   - Discussed patient's hospice qualifications including moderate dementia as well as his cardiac history including hospitalizations for bradycardia with patient not being a good candidate for invasive cardiac interventions as was discussed at the patient's prior hospitalization.   - This patient and family is well known to our Palliative Care service. Goals clearly remain for restorative care, for continued PT/OT/rehab as needed, family defers any comfort-focused hospice care for now. Please do see prior Palliative notes as well as family has not changed their goals since the last hospitalization. Family fully aware of patient's hospice eligibility and that they can always initiate hospice care from the outpatient setting. Goals for now are to continue restorative care, to defer hospice info visit referral, and to continue with current inpatient limits of DNR/full support. We cannot follow for non-malignant pain. Goals are clear, symptom burden low, ACP documents done at prior hospitalization, our team will sign off for now, please do re-consult as needed. Discussed with primary team.       # Code Status:       - NO CPR - SUPPORT OK       -Discussed Code status with the family       # Advance Care Planning:       - ACP Validation: Valid advanced care planning documents present on admission and  Valid durable DNR/POST documents present on admission       - Medical Decision Maker: MPOA document noted. MPOA #1 son Rick [name], MPOA #2 daughter-in-law Rick [name].        - ACP Document: Living will, HCPOA, and DDNR/POST      # Hospice Care:       - Family continues to defer any comfort/hospice care, goals remain for full restorative care.     2. Symptom Management:      # Pain:        - pain is well controlled,  patient denies any pain on exam. CrCl < 30. Avoid morphine products. Recommend non-opioid analgesics PRN. Can continue with tylenol PRN. We cannot follow for non-malignant pain.       # Bowel Habits:       - Bowel regimen prn         # At risk for Delirium:    - Delirium Precautions:           * Frequent reorientation by staff and maintaining appropriate day-night cycles          * Maximize visibility of environmental cues such as clocks, calendars          * Encourage communication and visits with family members and friends          * Allow exposure to natural light during the day, opening curtains to window          * Encourage day time activities, OOB and keeping pt active during day if possible           * Minimize disturbances and reduce light exposure and TV in the evening/night           * Limit non essential care such as blood draws, imaging, etc          * Group blood draws together to limit frequency of needle sticks           * Optimize nutrition, bladder/bowel regimen. Constipation, urinary retention, dehydration may worsen delirium. Rule out infection such as UTI or PNA which can predispose patients to delirium.          * Minimize medications that may cause/exacerbate delirium: opiates, anticholinergics, antihistamines, benzodiazepines, and other sedatives      # Debility    # Poor Performance Status:       - Generalized weakness       - PT/OT as tolerated    3. Psychosocial: Family support from son and daughter-in-law      4. Spiritual: patient identifies as Baptist     5. Outcomes: Family meetings, Improved pain intervention, Improved non-pain symptom therapy, Clarified goals of care, Counseled regarding hospice, Provided advance care planning, and Provided psychosocial or spiritual support      PC Team follow-up plans: PC to sign off       Discharge Disposition: TBD      Outpatient Follow Up Recommended: Yes Due to multiple co morbidities and limited functional status    Discussed case with:  hospitalist    80 minutes.  Total time today in care of patient including chart review, evaluation, management, counseling & coordination of care with recommendations above >50% of time on floor.       Hermelinda Dellen, MD  Palliative Medicine & Comprehensive Care  [facility]  Service Spectralink: [phone]  Mon-Fri 9a-4p  Palliative Care Office: [phone]  Mon-Fri 9a-4p     History of Presenting Illness  Medical Records Reviewed: [place] Records via Epic (current and past)    Kahiau Lelon Rehrer is a 89 y.o. male with significant PMH of moderate dementia, bradycardia with intermittent Mobitz [name] AV block, chronic combined CHF with EF 41%, CAD s/p CABG who presents s/p fall, found to have right nondisplaced hairline femoral fracture. Palliative Care consulted in the context of goals of care, advance care planning.     Patient seen and examined. He is resting comfortably, he has difficulty opening his eyes. He answers questions, follows commands, some sentences do not make sense. He denies pain but says he was uncomfortable during therapy session this morning. He does not have capacity to make his own medical decisions.     Called MPOA #1 son, no pickup. Called MPOA #2 daughter-in-law Stanton Kidney and discussed the case with her. Discussed goals. Discussed restorative care vs comfort/hospice care. Discussed patient's hospice eligibility. Stanton Kidney says her and her husband's (patient's son/MPOA #1) goals have not changed and their goals remain for full restorative care with PT/OT/rehab as needed.     Lives at home with son/daughter-in-law. Patient was in the [employer].      Goals of Care   CURRENT CPR Status: NO CPR - SUPPORT OK       Advanced Care Planning:      Decisional Capacity: no      Advance Directives have been completed in the past: yes      Advance Directives are available in chart: yes      Discussed this admission: yes      ACP note completed: no       Date:      Advance Care Planning Discussion:    Restorative care. Goals reamin for PT/OT/rehab as needed. Goals are to defer any comfort/hospice care for now.        Palliative Functional and Symptom Assessment     Palliative Performance Scale: 30% - Totally bed bound, unable to do any work, extensive disease, total care, reduced intake, full LOC, drowsy or confusion    FAST Score (Dementia Patients): unable to fully assess FAST score, likely moderate dementia per history        Review of Systems   Pain: present - adequately treated  Pain Location and Description: n/a  General:  no fevers or chills, no Headache, no insomnia  Eyes: no visual changes, eye pain, eye redness  Mouth:  no oral sores, lesions or thrush   Pulm: no cough, wheeze, or congestion, no dyspnea no pleuritic pain  Cards: no chest pain or palpitations, no edema  GI:  no abdominal pain or bloating, no weight loss  GU: no hematuria, dysuria, or incontinence, no urinary retention   Skin: no rash, lesions no skin breakdown, no puritis  MS: no new muscle weakness, joint stiffness, aches, swelling  Neuro: no focal weakness, numbness, tingling, no dizziness/vertigo   Psych: no history of Anxiety or depression  Heme: no easy bleeding, bruising, petechiae       Past Medical, Surgical and Family History   Past Medical History:  Medical History[1]     Past Surgical History:  Past Surgical History:   Procedure Laterality Date    COLONOSCOPY, DIAGNOSTIC (SCREENING)      CORONARY ARTERY BYPASS GRAFT  2003     approx year: LIMA to LAD,  SVG to OM 1 & OM 2 of circumflex CA, & SVG to RCA    FRACTURE SURGERY Right 06/2016    femur    HERNIORRHAPHY,  INGUINAL Right 04/29/2021    Procedure: OPEN RIGHT HERNIORRHAPHY, INGUINAL WITH MESH;  Surgeon: Towanda Octave, MD;  Location: [facility];  Service: General;  Laterality: Right;    PERCUTANEOUS VALVULOPLASTY - AORTIC N/A 07/12/2019    Procedure: TAVR #2;  Surgeon: Arlyss Queen, MD PhD;  Location: [facility];  Service: Cardiovascular;  Laterality: N/A;   23 HR OB    REPLACEMENT, AORTIC VALVE , EDWARDS SAPIEN N/A 07/12/2019    Procedure: REPLACEMENT, AORTIC VALVE , EDWARDS SAPIEN -- TAVR Valve:   S3 29mm -- Access: RTF -- IC: Dr. Penny Pia;  Surgeon: Christene Lye, MD;  Location: [facility];  Service: Cardiothoracic;  Laterality: N/A;    RIGHT & LEFT HEART CATH POSSIBLE PCI N/A 05/26/2019    Procedure: RIGHT & LEFT HEART CATH POSSIBLE PCI;  Surgeon: Arlyss Queen, MD PhD;  Location: [facility];  Service: Cardiovascular;  Laterality: N/A;  [id number] scheduled with Maira in dr's office. OP. kr    TOTAL KNEE ARTHROPLASTY Left 2017    TOTAL KNEE ARTHROPLASTY Right 2005        Past Family History:  Family History   Problem Relation Age of Onset    No known problems Mother     No known problems Father     No known problems Sister     No known problems Brother         I have reviewed the medical records, current labs, imaging studies, progress notes and consults.    Social History   Substance Use:    reports that he has never smoked. He has never used smokeless tobacco.    reports no history of alcohol use.    reports no history of drug use.     Marital Status: widowed    Home/Family: lives at home with son/daughter-in-law    Occupation/Hobbies: was in the [employer]    Cultural Concerns:    Nurse, learning disability needed: [x]  NO   []  YES                                       Language: English  ID#_________    Spirituality and Importance: Baptist      Medications   Scheduled Meds  Current Facility-Administered Medications   Medication Dose Route Frequency    docusate sodium  100 mg Oral BID    empagliflozin  10 mg Oral Daily    enoxaparin  30 mg Subcutaneous Daily    rosuvastatin  10 mg Oral QHS    sacubitril-valsartan  0.5 tablet Oral BID    spironolactone  12.5 mg Oral Daily    tamsulosin  0.4 mg Oral QPM      Drips     PRN Meds  PRN Medications[2]    Allergies   Allergies[3]    Physical Exam   BP 144/75   Pulse (!) 53   Temp 97.3 F (36.3 C) (Oral)   Resp 18   Ht 1.727  m (5\' 8" )   Wt 71.3 kg (157 lb 3.2 oz)   SpO2 95%   BMI 23.90 kg/m    Physical Exam:  General: laying supine in bed, in no acute distress, lethargic  HEENT: moist mucus membranes, no scleral icterus   Heart: RRR per monitor  Lungs: no increased work of breathing   Abd: non-distended  Ext: no edema  Skin: no jaundice  Psych: calm  Neuro: awake, answers only some questions appropriately, follows commands      Labs / Radiology   Lab and diagnostics: reviewed in Epic  Recent Labs   Lab 09/25/22  0329   WBC 4.45   Hemoglobin 11.8*   Hematocrit 36.1*   Platelet Count 85*              Recent Labs   Lab 09/25/22  0329   Sodium 143   Potassium 4.6   Chloride 113*   CO2 24   BUN 33*   Creatinine 1.5   GFR 42.6*   Glucose 119*   Calcium 9.7     Recent Labs   Lab 09/24/22  0401   Bilirubin, Total 0.8   Protein, Total 7.7   Albumin 3.5   ALT 12   AST (SGOT) 23          XR Femur Right AP And Lateral    Result Date: 09/24/2022  Healed fracture of the right distal femoral diaphysis status post ORIF with good alignment. The hairline fracture distal to this noted on CT scan is not visible on x-ray. Lanier Ensign, MD 09/24/2022 5:10 PM    CT Knee Right without Contrast    Result Date: 09/23/2022  1.Nondisplaced hairline fracture of the right distal femoral metadiaphysis anterolaterally; there may be some early callus formation at this fracture site. 2.Healed fracture of the femoral diaphysis status post ORIF, partially covered on this study. 3.Chronic fragmentation of the superomedial pole of the patella. 4.Intact right total knee arthroplasty. 5.Osteoporosis. Lanier Ensign, MD 09/23/2022 12:38 PM                 [1]   Past Medical History:  Diagnosis Date    Arrhythmia 2021    RBBB    Atrial enlargement, bilateral Echo result 04/04/19    Bilateral cataracts     Son reports pt has the ability to lay flat for 45+min without CP or SOB as reported on 05/14/22 for DOS  06/04/22    CAD (coronary artery disease) 05/28/19 Cath results.    Severe 3V  CAD.  Patent LIMA to LAD, patent SVG to OM 1 and OM 2 of circumflex CA, & patent SVG to RCA.    Chronic combined systolic and diastolic heart failure 310/21 OV Dr. Penny Pia    EF=30%    Dementia 04/2014    Difficulty walking 06/2016    Post surgery of lt knee, rt femur/ uses walker and wheelchair for long distances prn    Disorder of prostate     Dyslipidemia 310/21 OV Dr. Penny Pia    Dyspnea on exertion 02/2018    occas per son report on 05/14/22, doing better    Hearing loss     mild . does not like to wear/ bilat    HTN (hypertension) 310/21 OV Dr. Penny Pia    Son reports unaware of htn dx as reported on 3/114/24    Low back pain     muscular    Primary cardiomyopathy 11/2017    Urinary incontinence     occas  2-3 monthly at qhs or in am    VTE (venous thromboembolism) Feb 2023    In lungs, combined with bronchitis   [2]   Current Facility-Administered Medications   Medication Dose    acetaminophen  500 mg    acetaminophen  650 mg    benzocaine-menthol  1 lozenge    benzonatate  100 mg  carboxymethylcellulose sodium  1 drop    dextrose  15 g of glucose    Or    dextrose  12.5 g    Or    dextrose  12.5 g    Or    glucagon (rDNA)  1 mg    magnesium sulfate  1 g    melatonin  3 mg    naloxone  0.2 mg    potassium & sodium phosphates  2 packet    potassium chloride  0-40 mEq    And    potassium chloride  10 mEq    saline  2 spray   [3] No Known Allergies

## 2022-09-25 NOTE — Plan of Care (Signed)
Problem: Moderate/High Fall Risk Score >5  Goal: Patient will remain free of falls  09/25/2022 0010 by Rocky Link, RN  Outcome: Progressing  Flowsheets  Taken 09/24/2022 2100 by Rolanda Lundborg, RN  High (Greater than 13):   HIGH-Consider use of low bed   HIGH-Initiate use of floor mats as appropriate   HIGH-Apply yellow "Fall Risk" arm band   HIGH-Utilize chair pad alarm for patient while in the chair   HIGH-Bed alarm on at all times while patient in bed   HIGH-Visual cue at entrance to patient's room  Taken 09/24/2022 0521 by Dairl Ponder, RN  VH Moderate Risk (6-13): Use of floor mat  09/25/2022 0008 by Rocky Link, RN  Outcome: Progressing  Flowsheets  Taken 09/24/2022 2100 by Rolanda Lundborg, RN  High (Greater than 13):   HIGH-Consider use of low bed   HIGH-Initiate use of floor mats as appropriate   HIGH-Apply yellow "Fall Risk" arm band   HIGH-Utilize chair pad alarm for patient while in the chair   HIGH-Bed alarm on at all times while patient in bed   HIGH-Visual cue at entrance to patient's room  Taken 09/24/2022 0521 by Dairl Ponder, RN  VH Moderate Risk (6-13): Use of floor mat     Problem: Pain interferes with ability to perform ADL  Goal: Pain at adequate level as identified by patient  Outcome: Progressing  Flowsheets (Taken 09/24/2022 0521 by Dairl Ponder, RN)  Pain at adequate level as identified by patient:   Identify patient comfort function goal   Assess for risk of opioid induced respiratory depression, including snoring/sleep apnea. Alert healthcare team of risk factors identified.   Evaluate patient's satisfaction with pain management progress   Consult/collaborate with Pain Service   Evaluate if patient comfort function goal is met   Offer non-pharmacological pain management interventions   Consult/collaborate with Physical Therapy, Occupational Therapy, and/or Speech Therapy     Problem: Side Effects from Pain Analgesia  Goal: Patient  will experience minimal side effects of analgesic therapy  09/25/2022 0010 by Rocky Link, RN  Outcome: Progressing  Flowsheets (Taken 09/24/2022 0521 by Dairl Ponder, RN)  Patient will experience minimal side effects of analgesic therapy: Monitor/assess patient's respiratory status (RR depth, effort, breath sounds)  09/25/2022 0008 by Rocky Link, RN  Outcome: Progressing  Flowsheets (Taken 09/24/2022 0521 by Dairl Ponder, RN)  Patient will experience minimal side effects of analgesic therapy: Monitor/assess patient's respiratory status (RR depth, effort, breath sounds)     Problem: Compromised Activity/Mobility  Goal: Activity/Mobility Interventions  09/25/2022 0010 by Rocky Link, RN  Outcome: Progressing  Flowsheets (Taken 09/24/2022 2100 by Rolanda Lundborg, RN)  Activity/Mobility Interventions: Pad bony prominences, TAP Seated positioning system when OOB, Promote PMP, Reposition q 2 hrs / turn clock, Offload heels  09/25/2022 0008 by Rocky Link, RN  Outcome: Progressing  Flowsheets (Taken 09/24/2022 2100 by Rolanda Lundborg, RN)  Activity/Mobility Interventions: Pad bony prominences, TAP Seated positioning system when OOB, Promote PMP, Reposition q 2 hrs / turn clock, Offload heels

## 2022-09-26 LAB — LAB USE ONLY - CBC WITH DIFFERENTIAL
Absolute Basophils: 0.02 10*3/uL (ref 0.00–0.08)
Absolute Eosinophils: 0.13 10*3/uL (ref 0.00–0.44)
Absolute Immature Granulocytes: 0.01 10*3/uL (ref 0.00–0.07)
Absolute Lymphocytes: 1.64 10*3/uL (ref 0.42–3.22)
Absolute Monocytes: 0.59 10*3/uL (ref 0.21–0.85)
Absolute Neutrophils: 1.89 10*3/uL (ref 1.10–6.33)
Absolute nRBC: 0 10*3/uL (ref <=0.00)
Basophils %: 0.5 %
Eosinophils %: 3 %
Hematocrit: 33.8 % — ABNORMAL LOW (ref 37.6–49.6)
Hemoglobin: 10.9 g/dL — ABNORMAL LOW (ref 12.5–17.1)
Immature Granulocytes %: 0.2 %
Lymphocytes %: 38.3 %
MCH: 34 pg — ABNORMAL HIGH (ref 25.1–33.5)
MCHC: 32.2 g/dL (ref 31.5–35.8)
MCV: 105.3 fL — ABNORMAL HIGH (ref 78.0–96.0)
MPV: 11.7 fL (ref 8.9–12.5)
Monocytes %: 13.8 %
Neutrophils %: 44.2 %
Platelet Count: 82 10*3/uL — ABNORMAL LOW (ref 142–346)
Preliminary Absolute Neutrophil Count: 1.89 10*3/uL (ref 1.10–6.33)
RBC: 3.21 10*6/uL — ABNORMAL LOW (ref 4.20–5.90)
RDW: 13 % (ref 11–15)
WBC: 4.28 10*3/uL (ref 3.10–9.50)
nRBC %: 0 /100 WBC (ref <=0.0)

## 2022-09-26 LAB — COMPREHENSIVE METABOLIC PANEL
ALT: 8 U/L (ref 0–55)
AST (SGOT): 15 U/L (ref 5–41)
Albumin/Globulin Ratio: 0.8 — ABNORMAL LOW (ref 0.9–2.2)
Albumin: 3 g/dL — ABNORMAL LOW (ref 3.5–5.0)
Alkaline Phosphatase: 57 U/L (ref 37–117)
Anion Gap: 7 (ref 5.0–15.0)
BUN: 41 mg/dL — ABNORMAL HIGH (ref 9–28)
Bilirubin, Total: 0.5 mg/dL (ref 0.2–1.2)
CO2: 23 mEq/L (ref 17–29)
Calcium: 9 mg/dL (ref 7.9–10.2)
Chloride: 113 mEq/L — ABNORMAL HIGH (ref 99–111)
Creatinine: 1.8 mg/dL — ABNORMAL HIGH (ref 0.5–1.5)
GFR: 34.2 mL/min/{1.73_m2} — ABNORMAL LOW (ref >=60.0)
Globulin: 3.7 g/dL — ABNORMAL HIGH (ref 2.0–3.6)
Glucose: 91 mg/dL (ref 70–100)
Potassium: 5.1 mEq/L (ref 3.5–5.3)
Protein, Total: 6.7 g/dL (ref 6.0–8.3)
Sodium: 143 mEq/L (ref 135–145)

## 2022-09-26 LAB — MAGNESIUM: Magnesium: 2 mg/dL (ref 1.6–2.6)

## 2022-09-26 LAB — PHOSPHORUS: Phosphorus: 3.8 mg/dL (ref 2.3–4.7)

## 2022-09-26 NOTE — Plan of Care (Signed)
NURSING SHIFT NOTE     Patient: Rick [name]  Day: 2      SHIFT EVENTS     Shift Narrative/Significant Events (PRN med administration, fall, RRT, etc.):     -Patient is alert and oriented  self .   -Vital signs stable. Pt is not in distress.   -Tolerated PO medications and meals with no evidence of dysphagia  -Patient complained of leg pain when repositioned  -Patient on pureed diet, tolerated meal well with no nausea and vomiting  -Patient total assist with feeding   -Pillows used for comfort, support and reduce heel pressure.   Personal belongings, call light and room house phone is within reach. Bed alarm is ON with 3/4 bed rails up, wheels are locked and placed on lowest bed position. Purposeful hourly rounding continues. Fall precaution in place.  Continue care of plan.  Safety and fall precautions remain in place. Purposeful rounding completed.          ASSESSMENT     Changes in assessment from patient's baseline this shift:    Neuro: No  CV: No  Pulm: No  Peripheral Vascular: No  HEENT: No  GI: No  BM during shift: No, Last BM: Last BM Date: 09/24/22  GU: No   Integ: No  MS: No    Pain: No change  Pain Interventions: Positioning  Medications Utilized:     Mobility: PMP Activity: Step 3 - Bed Mobility of Distance Walked (ft) (Step 6,7): 0 Feet           Lines     Patient Lines/Drains/Airways Status       Active Lines, Drains and Airways       Name Placement date Placement time Site Days    Peripheral IV 09/23/22 24 G Standard Anterior;Left Forearm 09/23/22  2356  Forearm  2    External Urinary Catheter 09/23/22  1730  --  2                         VITAL SIGNS     Vitals:    09/26/22 1552   BP: 99/49   Pulse: (!) 51   Resp:    Temp: 98.4 F (36.9 C)   SpO2: 97%       Temp  Min: 97.3 F (36.3 C)  Max: 98.4 F (36.9 C)  Pulse  Min: 35  Max: 66  Resp  Min: 16  Max: 18  BP  Min: 98/47  Max: 134/54  SpO2  Min: 94 %  Max: 98 %      Intake/Output Summary (Last 24 hours) at 09/26/2022 1641  Last data  filed at 09/26/2022 1300  Gross per 24 hour   Intake 1160 ml   Output 301 ml   Net 859 ml          CARE PLAN       Problem: Compromised Sensory Perception  Goal: Sensory Perception Interventions  Flowsheets (Taken 09/26/2022 1639)  Sensory Perception Interventions: Offload heels, Pad bony prominences, Reposition q 2hrs/turn Clock, Q2 hour skin assessment under devices if present     Problem: Compromised Moisture  Goal: Moisture level Interventions  Flowsheets (Taken 09/26/2022 1639)  Moisture level Interventions: Moisture wicking products, Moisture barrier cream     Problem: Compromised Activity/Mobility  Goal: Activity/Mobility Interventions  Flowsheets (Taken 09/26/2022 1639)  Activity/Mobility Interventions: Pad bony prominences, TAP Seated positioning system when OOB, Promote PMP, Reposition q 2 hrs / turn  clock, Offload heels     Problem: Compromised Nutrition  Goal: Nutrition Interventions  Flowsheets (Taken 09/26/2022 1639)  Nutrition Interventions: Discuss nutrition at Rounds, I&Os, Document % meal eaten, Daily weights     Problem: Compromised Friction/Shear  Goal: Friction and Shear Interventions  Flowsheets (Taken 09/26/2022 1639)  Friction and Shear Interventions: Pad bony prominences, Off load heels, HOB 30 degrees or less unless contraindicated, Consider: TAP seated positioning, Heel foams     Problem: Compromised Nutrition  Goal: Nutrition Interventions  Flowsheets (Taken 09/26/2022 1639)  Nutrition Interventions: Discuss nutrition at Rounds, I&Os, Document % meal eaten, Daily weights

## 2022-09-26 NOTE — Progress Notes (Signed)
4 eyes in 4 hours pressure injury assessment note:      Completed with:   Unit & Time admitted:              Bony Prominences: Check appropriate box; if wound is present enter wound assessment in LDA     Occiput:                 [x] WNL  []  Wound present  Face:                     [x] WNL  []  Wound present  Ears:                      [x] WNL  []  Wound present  Spine:                    [x] WNL  []  Wound present  Shoulders:             [x] WNL  []  Wound present  Elbows:                  [x] WNL  []  Wound present  Sacrum/coccyx:     [x] WNL  []  Wound present  Ischial Tuberosity:  [x] WNL  []  Wound present  Trochanter/Hip:      [x] WNL  []  Wound present  Knees:                   [x] WNL  []  Wound present  Ankles:                   [x] WNL  []  Wound present  Heels:                    [x] WNL  []  Wound present  Other pressure areas:  []  Wound location       Device related: []  Device name:         LDA completed if wound present: yes/no  Consult WOCN if necessary    Other skin related issues, ie tears, rash, etc, document in Integumentary flowsheet: scattered scar

## 2022-09-26 NOTE — Progress Notes (Signed)
[facility]  PROGRESS NOTE      Patient: Rick [name]  Date: 09/26/2022   LOS: 2 Days  Admission Date: 09/23/2022   MRN: [id number]  Attending: Melina Fiddler, MD  When on service as the attending, please contact me on Epic Secure Chat from 7 AM - 7 PM for non-urgent issues. For urgent matters use XTend page from 7 AM - 7 PM.       ASSESSMENT/PLAN     Rick [name] is a 89 y.o. male admitted with Closed fracture of right femur, unspecified fracture morphology, unspecified portion of femur, initial encounter        Closed fracture of right femur 09/23/2022     nondisplaced unicortical right distal femoral metadiaphyseal periprosthetic fracture    Imaging reviewed and read by radiologist including CT right knee without contrast which showed nondisplaced hairline fracture of the right distal femoral metadiaphysis, healed fracture of the femoral diaphysis status post ORIF, chronic fragmentation of the superior medial pole of the patella.  -Discussed with orthopedic surgery who recommend nonoperative treatment at this point  Weightbearing as tolerated on the right lower extremity  PT OT treatment  -PT OT on board with recs for SNF  -Continue pain management and bowel regimen         AKI on CKD could be prerenal from decreased oral intake  Hold Entresto and spironolactone for now  Encourage fluid intake.  Discussed with family and nurse at bedside to encourage fluid intake  Repeat renal function in the morning      Failure to thrive  Nutrition consult  Feeding assistant  Ensure plus twice daily    Bradycardia  Intermittent Mobitz type II AV block  As noted, patient was evaluated by cardiology and EP on prior admissions, pace maker insertion was recommended but patient's son decided against it given patient's advanced age and dementia.  Memantine and donepezil were discontinued because of bradycardia.  -Consult placed for Palliative care    -patient seen by cardiology, no intervention  recommended  -Patient continue to have pauses 2-2.8 seconds. No BB  -Monitor patient on Tele     Chronic combined systolic and diastolic heart failure with reduced EF (stable volume status)  Known cardiomyopathy with improved EF from 25-30% to of 41% on echo done 08/2022  -Hold Entresto and spironolactone given AKI  -Continue Jardiance  -Close monitoring of renal function     Coronary artery disease s/p CABG ( Denied CP/sob)  -Continue statin     Dementia  -Donepezil and Memantine discontinued during previous admission  -Monitor for fall     Chronic thrombocytopenia  -Monitor platelet count  No active signs of bleeding  Okay for DVT prophylaxis platelet count more than 50      History of PE  -Patient is not on anticoagulation     Enlarged prostate  -Continue Flomax 0.4 mg daily                    Nutrition: Pured diet and feeding assistance                 Recent Labs     09/26/22  0336 09/25/22  0329 09/24/22  0401   Platelet Count 82* 85* 81*     Diagnosis: Moderate Thrombocytopenia             Recent Labs   Lab 09/26/22  0336 09/25/22  0329 09/24/22  0401   Hemoglobin 10.9* 11.8*  11.6*   Hematocrit 33.8* 36.1* 36.6*   MCV 105.3* 103.7* 105.8*   WBC 4.28 4.45 3.32   Platelet Count 82* 85* 81*         Anemia Diagnosis: Already documented in note    Code Status: NO CPR - SUPPORT OK    Dispo: SNF hopefully within 24 to 48 hours    Family Contact: Daughter-in-law at bedside    DVT Prophylaxis:   Current Facility-Administered Medications (Includes Only Anticoagulants, Misc. Hematological)   Medication Dose Route Last Admin    enoxaparin (LOVENOX) syringe 30 mg  30 mg Subcutaneous 30 mg at 09/25/22 0850          CHART  REVIEW & DISCUSSION     The following chart items were reviewed as of 2:23 PM on 09/26/22:  [x]  Lab Results [x]  Imaging Results   []  Problem List  [x]  Current Orders [x]  Current Medications  []  Allergies  [x]  Code Status [x]  Previous Notes   []  SDoH    The management and plan of care for this patient was  discussed with the following specialty consultants:  []  Cardiology  []  Gastroenterology                 []  Infectious Disease  []  Pulmonology []  Neurology                []  Nephrology  []  Neurosurgery [x]  Orthopedic Surgery  []  Heme/Onc  []  General Surgery []  Psychiatry                                   []  Palliative    SUBJECTIVE     Rick [name] resting comfortably in bed denies any headache dizziness chest pain shortness of breath lower extremity pain    MEDICATIONS     Current Facility-Administered Medications   Medication Dose Route Frequency    empagliflozin  10 mg Oral Daily    enoxaparin  30 mg Subcutaneous Daily    rosuvastatin  10 mg Oral QHS    tamsulosin  0.4 mg Oral QPM       PHYSICAL EXAM     Vitals:    09/26/22 1418   BP:    Pulse: (!) 51   Resp:    Temp:    SpO2: 98%       Temperature: Temp  Min: 97.3 F (36.3 C)  Max: 98.4 F (36.9 C)  Pulse: Pulse  Min: 34  Max: 66  Respiratory: Resp  Min: 16  Max: 18  Non-Invasive BP: BP  Min: 98/47  Max: 139/60  Pulse Oximetry SpO2  Min: 94 %  Max: 98 %    Intake and Output Summary (Last 24 hours) at Date Time    Intake/Output Summary (Last 24 hours) at 09/26/2022 1423  Last data filed at 09/26/2022 1300  Gross per 24 hour   Intake 1160 ml   Output 901 ml   Net 259 ml         GEN APPEARANCE: Normal; cachectic  HEENT: PERLA; EOMI; Conjunctiva Clear  NECK: Supple; No bruits  CVS: RRR, S1, S2;   LUNGS: CTAB; No Wheezes; No Rhonchi: No rales  ABD: Soft; No TTP; + Normoactive BS  EXT: No edema; Pulses 2+ and intact  NEURO: Unable to assess      LABS     Recent Labs   Lab 09/26/22  0336 09/25/22  0329 09/24/22  0401   WBC 4.28 4.45 3.32   RBC 3.21* 3.48* 3.46*   Hemoglobin 10.9* 11.8* 11.6*   Hematocrit 33.8* 36.1* 36.6*   MCV 105.3* 103.7* 105.8*   Platelet Count 82* 85* 81*       Recent Labs   Lab 09/26/22  0336 09/25/22  0329 09/24/22  0401   Sodium 143 143 141   Potassium 5.1 4.6 4.8   Chloride 113* 113* 113*   CO2 23 24 24    BUN 41* 33* 37*    Creatinine 1.8* 1.5 1.6*   Glucose 91 119* 80   Calcium 9.0 9.7 9.7   Magnesium 2.0  --   --        Recent Labs   Lab 09/26/22  0336 09/24/22  0401   ALT 8 12   AST (SGOT) 15 23   Bilirubin, Total 0.5 0.8   Albumin 3.0* 3.5   Alkaline Phosphatase 57 58                   Microbiology Results (last 15 days)       ** No results found for the last 360 hours. **             RADIOLOGY     XR Femur Right AP And Lateral    Result Date: 09/24/2022  Healed fracture of the right distal femoral diaphysis status post ORIF with good alignment. The hairline fracture distal to this noted on CT scan is not visible on x-ray. Lanier Ensign, MD 09/24/2022 5:10 PM    CT Knee Right without Contrast    Result Date: 09/23/2022  1.Nondisplaced hairline fracture of the right distal femoral metadiaphysis anterolaterally; there may be some early callus formation at this fracture site. 2.Healed fracture of the femoral diaphysis status post ORIF, partially covered on this study. 3.Chronic fragmentation of the superomedial pole of the patella. 4.Intact right total knee arthroplasty. 5.Osteoporosis. Lanier Ensign, MD 09/23/2022 12:38 PM   Echo Results       None          Results for orders placed or performed during the hospital encounter of 06/18/22   CT Head WO Contrast    Narrative    HISTORY: Unwitnessed fall. Posttraumatic headache and weakness. Patient is  on anticoagulation.    COMPARISON: Outside Brain MRI on 09/03/2020.    TECHNIQUE: CT of the head performed without intravenous contrast. The  following dose reduction techniques were utilized: automated exposure  control and/or adjustment of the mA and/or KV according to patient size,  and the use of an iterative reconstruction technique.    CONTRAST: None.    FINDINGS:  There is no acute hemorrhage. The ventricles and subarachnoid spaces are  proportionately enlarged reflecting volume loss.  The gray-white matter  differentiation is preserved. There is a small chronic lacunar infarct in  right  cerebellum. There is moderate periventricular hypodensity most  consistent with chronic small vessel ischemic change. There is no mass  effect or midline shift. The basal cisterns are patent. Intracranial  vascular calcifications are present.    No suspicious or aggressive osseous lesion is identified. The imaged  paranasal sinuses and mastoids are clear.         Impression       1.No CT evidence of acute intracranial hemorrhage, herniation, or  hydrocephalus.   2.Moderate chronic small vessel ischemic changes.      Nelya Ebadirad  06/18/2022 9:37 AM       Signed,  Melina Fiddler, MD  2:23 PM 09/26/2022

## 2022-09-26 NOTE — Plan of Care (Signed)
Problem: Moderate/High Fall Risk Score >5  Goal: Patient will remain free of falls  Outcome: Progressing  Flowsheets  Taken 09/25/2022 1454 by Wess Botts, RN  High (Greater than 13):   HIGH-Consider use of low bed   HIGH-Initiate use of floor mats as appropriate   HIGH-Pharmacy to initiate evaluation and intervention per protocol   HIGH-Apply yellow "Fall Risk" arm band   HIGH-Bed alarm on at all times while patient in bed   HIGH-Visual cue at entrance to patient's room  Taken 09/24/2022 0521 by Dairl Ponder, RN  VH Moderate Risk (6-13): Use of floor mat     Problem: Pain interferes with ability to perform ADL  Goal: Pain at adequate level as identified by patient  Outcome: Progressing  Flowsheets (Taken 09/25/2022 1454 by Wess Botts, RN)  Pain at adequate level as identified by patient: Assess for risk of opioid induced respiratory depression, including snoring/sleep apnea. Alert healthcare team of risk factors identified.     Problem: Side Effects from Pain Analgesia  Goal: Patient will experience minimal side effects of analgesic therapy  Outcome: Progressing  Flowsheets (Taken 09/24/2022 0521 by Dairl Ponder, RN)  Patient will experience minimal side effects of analgesic therapy: Monitor/assess patient's respiratory status (RR depth, effort, breath sounds)     Problem: Hemodynamic Status: Cardiac  Goal: Stable vital signs and fluid balance  Outcome: Progressing  Flowsheets (Taken 09/25/2022 1454 by Wess Botts, RN)  Stable vital signs and fluid balance: Assess signs and symptoms associated with cardiac rhythm changes

## 2022-09-27 LAB — LAB USE ONLY - CBC WITH DIFFERENTIAL
Absolute Basophils: 0.01 10*3/uL (ref 0.00–0.08)
Absolute Eosinophils: 0.14 10*3/uL (ref 0.00–0.44)
Absolute Immature Granulocytes: 0.01 10*3/uL (ref 0.00–0.07)
Absolute Lymphocytes: 1.56 10*3/uL (ref 0.42–3.22)
Absolute Monocytes: 0.58 10*3/uL (ref 0.21–0.85)
Absolute Neutrophils: 2.23 10*3/uL (ref 1.10–6.33)
Absolute nRBC: 0 10*3/uL (ref <=0.00)
Basophils %: 0.2 %
Eosinophils %: 3.1 %
Hematocrit: 34.1 % — ABNORMAL LOW (ref 37.6–49.6)
Hemoglobin: 11 g/dL — ABNORMAL LOW (ref 12.5–17.1)
Immature Granulocytes %: 0.2 %
Lymphocytes %: 34.4 %
MCH: 34 pg — ABNORMAL HIGH (ref 25.1–33.5)
MCHC: 32.3 g/dL (ref 31.5–35.8)
MCV: 105.2 fL — ABNORMAL HIGH (ref 78.0–96.0)
MPV: 11.7 fL (ref 8.9–12.5)
Monocytes %: 12.8 %
Neutrophils %: 49.3 %
Platelet Count: 87 10*3/uL — ABNORMAL LOW (ref 142–346)
Preliminary Absolute Neutrophil Count: 2.23 10*3/uL (ref 1.10–6.33)
RBC: 3.24 10*6/uL — ABNORMAL LOW (ref 4.20–5.90)
RDW: 13 % (ref 11–15)
WBC: 4.53 10*3/uL (ref 3.10–9.50)
nRBC %: 0 /100 WBC (ref <=0.0)

## 2022-09-27 LAB — COMPREHENSIVE METABOLIC PANEL
ALT: 9 U/L (ref 0–55)
AST (SGOT): 14 U/L (ref 5–41)
Albumin/Globulin Ratio: 0.8 — ABNORMAL LOW (ref 0.9–2.2)
Albumin: 3.1 g/dL — ABNORMAL LOW (ref 3.5–5.0)
Alkaline Phosphatase: 57 U/L (ref 37–117)
Anion Gap: 6 (ref 5.0–15.0)
BUN: 44 mg/dL — ABNORMAL HIGH (ref 9–28)
Bilirubin, Total: 0.5 mg/dL (ref 0.2–1.2)
CO2: 24 mEq/L (ref 17–29)
Calcium: 9 mg/dL (ref 7.9–10.2)
Chloride: 112 mEq/L — ABNORMAL HIGH (ref 99–111)
Creatinine: 1.6 mg/dL — ABNORMAL HIGH (ref 0.5–1.5)
GFR: 39.4 mL/min/{1.73_m2} — ABNORMAL LOW (ref >=60.0)
Globulin: 3.8 g/dL — ABNORMAL HIGH (ref 2.0–3.6)
Glucose: 89 mg/dL (ref 70–100)
Potassium: 4.8 mEq/L (ref 3.5–5.3)
Protein, Total: 6.9 g/dL (ref 6.0–8.3)
Sodium: 142 mEq/L (ref 135–145)

## 2022-09-27 LAB — MAGNESIUM: Magnesium: 2.1 mg/dL (ref 1.6–2.6)

## 2022-09-27 LAB — PHOSPHORUS: Phosphorus: 3.3 mg/dL (ref 2.3–4.7)

## 2022-09-27 NOTE — Progress Notes (Signed)
NURSING SHIFT NOTE     Patient: Rick [name]  Day: 3      SHIFT EVENTS     Shift Narrative/Significant Events (PRN med administration, fall, RRT, etc.):   Tolerated scheduled meds, repositioned for comfort, requires feeding, fluids encouraged, daily weight, plan more PT sections, safety and fall precautions remain in place. Purposeful rounding completed.          ASSESSMENT     Changes in assessment from patient's baseline this shift:    Neuro: No  CV: No  Pulm: No  Peripheral Vascular: No  HEENT: No  GI: No  BM during shift: No, Last BM: Last BM Date: 09/24/22  GU: No   Integ: No  MS: No    Pain: No change  Pain Interventions: Rest  Medications Utilized:     Mobility: PMP Activity: Step 3 - Bed Mobility of Distance Walked (ft) (Step 6,7): 0 Feet           Lines     Patient Lines/Drains/Airways Status       Active Lines, Drains and Airways       Name Placement date Placement time Site Days    Peripheral IV 09/23/22 24 G Standard Anterior;Left Forearm 09/23/22  2356  Forearm  3    External Urinary Catheter 09/23/22  1730  --  3                         VITAL SIGNS     Vitals:    09/27/22 0315   BP: 140/63   Pulse: (!) 53   Resp: 17   Temp: 98.2 F (36.8 C)   SpO2: 98%       Temp  Min: 97.3 F (36.3 C)  Max: 98.4 F (36.9 C)  Pulse  Min: 35  Max: 70  Resp  Min: 17  Max: 18  BP  Min: 90/53  Max: 140/63  SpO2  Min: 93 %  Max: 99 %      Intake/Output Summary (Last 24 hours) at 09/27/2022 0316  Last data filed at 09/26/2022 2345  Gross per 24 hour   Intake 1310 ml   Output 301 ml   Net 1009 ml            CARE PLAN     4 eyes in 4 hours pressure injury assessment note:      CNA Completed with: Johnny Bridge           Bony Prominences: Check appropriate box below     If wound is present add wound to LDA avatar      Occiput:   [x]  WNL   []  Wound present    Face:                     [x]  WNL    []  Wound present    Ears:      [x]  WNL   []  Wound present    Spine:    [x]  WNL   []  Wound present    Shoulders:    [x]  WNL    []   Wound present    Elbows:    [x]  WNL    []  Wound present    Sacrum/coccyx:   []  WNL    []  Wound present    Ischial Tuberosity:  [x]  WNL  []  Wound present    Trochanter/Hip:       [x]  WNL  []  Wound present  Knees:     [x]  WNL  []  Wound present    Ankles:     [x]  WNL  []  Wound present    Heels:    [x]  WNL  []  Wound present      Other pressure areas:      Wound Location: redness ay coccyx      Device related: []  Device:       Consult WOCN for guidance & staging of pressure injuries.

## 2022-09-27 NOTE — Progress Notes (Signed)
4 eyes in 4 hours pressure injury assessment note:      Completed with:  [place] RN   Unit & Time admitted:              Bony Prominences: Check appropriate box; if wound is present enter wound assessment in LDA     Occiput:                 [x] WNL  []  Wound present  Face:                     [x] WNL  []  Wound present  Ears:                      [x] WNL  []  Wound present  Spine:                    [x] WNL  []  Wound present  Shoulders:             [x] WNL  []  Wound present  Elbows:                  [x] WNL  []  Wound present  Sacrum/coccyx:     [x] WNL  []  Wound present  Ischial Tuberosity:  [x] WNL  []  Wound present  Trochanter/Hip:      [x] WNL  []  Wound present  Knees:                   WNL  []  Wound present  Ankles:                   [x] [x] WNL[]  Wound present  Heels:                    [x] WNL  []  Wound present  Other pressure areas:  []  Wound location       Device related: []  Device name:         LDA completed if wound present: yes/no  Consult WOCN if necessary    Other skin related issues, ie tears, rash, etc, document in Integumentary flowsheet

## 2022-09-27 NOTE — Plan of Care (Signed)
NURSING SHIFT NOTE     Patient: Rick [name]  Day: 3      SHIFT EVENTS     Shift Narrative/Significant Events (PRN med administration, fall, RRT, etc.):     -Patient is alert and oriented self.   -Vital signs stable. Pt is not in distress.   -Tolerated PO medications and meals with no evidence of dysphagia  -Patient complained of abdominal  pain, managed with PRN tylenol   -Patient on pureed and mildly thick diet, -  -Patient is on bed mobility he is improving his bed mobility -positive bowel sounds, LBM IS 8/10 24.  -external cath in place draining yellow clear urine  -patient is repositioned as tolerated     Personal belongings, call light  within reach. Bed alarm is ON with 3/4 bed rails up, wheels are locked and placed on lowest bed position. Fall precaution in place.  Safety and fall precautions remain in place.        ASSESSMENT     Changes in assessment from patient's baseline this shift:    Neuro: No  CV: No  Pulm: No  Peripheral Vascular: No  HEENT: No  GI: No  BM during shift: No, Last BM: 09/26/22  GU: No   Integ: No  MS: NO  Pain: yes on abdomen relived by  taking medicationMedications Utilized: tylenol given    Mobility: PMP Activity: Step 3 - Bed Mobility of Distance Walked (ft) (Step 6,7): 0 Feet           Lines     Patient Lines/Drains/Airways Status       Active Lines, Drains and Airways       Name Placement date Placement time Site Days    Peripheral IV 09/23/22 24 G Standard Anterior;Left Forearm 09/23/22  2356  Forearm  3    External Urinary Catheter 09/23/22  1730  --  3                         VITAL SIGNS     Vitals:    09/27/22 1525   BP: 116/68   Pulse: (!) 58   Resp: 17   Temp: 98.2 F (36.8 C)   SpO2: 96%       Temp  Min: 97.3 F (36.3 C)  Max: 98.2 F (36.8 C)  Pulse  Min: 33  Max: 91  Resp  Min: 17  Max: 17  BP  Min: 90/53  Max: 140/63  SpO2  Min: 93 %  Max: 99 %      Intake/Output Summary (Last 24 hours) at 09/27/2022 1607  Last data filed at 09/27/2022 0902  Gross per 24  hour   Intake 870 ml   Output 500 ml   Net 370 ml              CARE PLAN         Problem: Moderate/High Fall Risk Score >5  Goal: Patient will remain free of falls  Flowsheets (Taken 09/27/2022 1523)  High (Greater than 13):   HIGH-Bed alarm on at all times while patient in bed   HIGH-Initiate use of floor mats as appropriate   HIGH-Consider use of low bed   HIGH-Apply yellow "Fall Risk" arm band     Problem: Pain interferes with ability to perform ADL  Goal: Pain at adequate level as identified by patient  Flowsheets (Taken 09/27/2022 1523)  Pain at adequate level as identified by patient:  Assess for risk of opioid induced respiratory depression, including snoring/sleep apnea. Alert healthcare team of risk factors identified.   Identify patient comfort function goal     Problem: Side Effects from Pain Analgesia  Goal: Patient will experience minimal side effects of analgesic therapy  Flowsheets (Taken 09/27/2022 1523)  Patient will experience minimal side effects of analgesic therapy: Monitor/assess patient's respiratory status (RR depth, effort, breath sounds)     Problem: Compromised Sensory Perception  Goal: Sensory Perception Interventions  Flowsheets (Taken 09/27/2022 1523)  Sensory Perception Interventions: Offload heels, Pad bony prominences, Reposition q 2hrs/turn Clock, Q2 hour skin assessment under devices if present

## 2022-09-27 NOTE — Progress Notes (Signed)
[facility]  PROGRESS NOTE      Patient: Rick [name]  Date: 09/27/2022   LOS: 3 Days  Admission Date: 09/23/2022   MRN: [id number]  Attending: Melina Fiddler, MD  When on service as the attending, please contact me on Epic Secure Chat from 7 AM - 7 PM for non-urgent issues. For urgent matters use XTend page from 7 AM - 7 PM.       ASSESSMENT/PLAN     Rick [name] is a 89 y.o. male admitted with Closed fracture of right femur, unspecified fracture morphology, unspecified portion of femur, initial encounter        Closed fracture of right femur 09/23/2022     nondisplaced unicortical right distal femoral metadiaphyseal periprosthetic fracture    Imaging reviewed and read by radiologist including CT right knee without contrast which showed nondisplaced hairline fracture of the right distal femoral metadiaphysis, healed fracture of the femoral diaphysis status post ORIF, chronic fragmentation of the superior medial pole of the patella.  -Discussed with orthopedic surgery who recommend nonoperative treatment at this point  Weightbearing as tolerated on the right lower extremity  PT OT treatment  -PT OT on board with recs for SNF  -Continue pain management and bowel regimen         AKI on CKD could be prerenal from decreased oral intake  Cr 1.8>1.6 today   Continue Hold Entresto and spironolactone for 1 more day  Encourage fluid intake.  Discussed with  nurse at bedside to encourage fluid intake  Repeat renal function in the morning      Failure to thrive  Nutrition consult  Feeding assistant  Ensure plus twice daily    Bradycardia  Intermittent Mobitz type II AV block  As noted, patient was evaluated by cardiology and EP on prior admissions, pace maker insertion was recommended but patient's son decided against it given patient's advanced age and dementia.  Memantine and donepezil were discontinued because of bradycardia.  -Consult placed for Palliative care    -patient seen by cardiology, no  intervention recommended  -Patient continue to have pauses 2-2.8 seconds. No BB  -Monitor patient on Tele     Chronic combined systolic and diastolic heart failure with reduced EF (stable volume status)  Known cardiomyopathy with improved EF from 25-30% to of 41% on echo done 08/2022  -Hold Entresto and spironolactone given AKI  -Continue Jardiance  -Close monitoring of renal function     Coronary artery disease s/p CABG ( Denied CP/sob)  -Continue statin     Dementia  -Donepezil and Memantine discontinued during previous admission  -Monitor for fall     Chronic thrombocytopenia  -Monitor platelet count  No active signs of bleeding  Okay for DVT prophylaxis platelet count more than 50      History of PE  -Patient is not on anticoagulation     Enlarged prostate  -Continue Flomax 0.4 mg daily                    Nutrition: Pured diet and feeding assistance                   Recent Labs     09/27/22  0316 09/26/22  0336 09/25/22  0329   Platelet Count 87* 82* 85*     Diagnosis: Moderate Thrombocytopenia               Recent Labs   Lab 09/27/22  0316  09/26/22  0336 09/25/22  0329   Hemoglobin 11.0* 10.9* 11.8*   Hematocrit 34.1* 33.8* 36.1*   MCV 105.2* 105.3* 103.7*   WBC 4.53 4.28 4.45   Platelet Count 87* 82* 85*         Anemia Diagnosis: Already documented in note    Code Status: NO CPR - SUPPORT OK    Dispo: SNF hopefully within 24 to 48 hours    Family Contact: none     DVT Prophylaxis:   Current Facility-Administered Medications (Includes Only Anticoagulants, Misc. Hematological)   Medication Dose Route Last Admin    enoxaparin (LOVENOX) syringe 30 mg  30 mg Subcutaneous 30 mg at 09/27/22 0825          CHART  REVIEW & DISCUSSION     The following chart items were reviewed as of 12:03 PM on 09/27/22:  [x]  Lab Results [x]  Imaging Results   []  Problem List  [x]  Current Orders [x]  Current Medications  []  Allergies  [x]  Code Status [x]  Previous Notes   []  SDoH    The management and plan of care for this patient was  discussed with the following specialty consultants:  []  Cardiology  []  Gastroenterology                 []  Infectious Disease  []  Pulmonology []  Neurology                []  Nephrology  []  Neurosurgery [x]  Orthopedic Surgery  []  Heme/Onc  []  General Surgery []  Psychiatry                                   []  Palliative    SUBJECTIVE     Rick [name] seen comfortably in bed more awake today.  Denies any headache dizziness chest pain shortness of breath abdominal pain he had mild right knee pain and he stated that he has the pain because he fell.  MEDICATIONS     Current Facility-Administered Medications   Medication Dose Route Frequency    empagliflozin  10 mg Oral Daily    enoxaparin  30 mg Subcutaneous Daily    rosuvastatin  10 mg Oral QHS    tamsulosin  0.4 mg Oral QPM       PHYSICAL EXAM     Vitals:    09/27/22 1125   BP: 95/50   Pulse: (!) 33   Resp: 17   Temp: 98.2 F (36.8 C)   SpO2: 94%       Temperature: Temp  Min: 97.3 F (36.3 C)  Max: 98.4 F (36.9 C)  Pulse: Pulse  Min: 33  Max: 70  Respiratory: Resp  Min: 17  Max: 17  Non-Invasive BP: BP  Min: 90/53  Max: 140/63  Pulse Oximetry SpO2  Min: 93 %  Max: 99 %    Intake and Output Summary (Last 24 hours) at Date Time    Intake/Output Summary (Last 24 hours) at 09/27/2022 1203  Last data filed at 09/27/2022 0902  Gross per 24 hour   Intake 1070 ml   Output 500 ml   Net 570 ml         GEN APPEARANCE: Normal; cachectic alert oriented x 3  HEENT: PERLA; EOMI; Conjunctiva Clear  NECK: Supple; No bruits  CVS: RRR, S1, S2;   LUNGS: CTAB; No Wheezes; No Rhonchi: No rales  ABD: Soft; No TTP; + Normoactive BS  EXT: No edema; Pulses 2+ and intact  NEURO: Unable to assess      LABS     Recent Labs   Lab 09/27/22  0316 09/26/22  0336 09/25/22  0329   WBC 4.53 4.28 4.45   RBC 3.24* 3.21* 3.48*   Hemoglobin 11.0* 10.9* 11.8*   Hematocrit 34.1* 33.8* 36.1*   MCV 105.2* 105.3* 103.7*   Platelet Count 87* 82* 85*       Recent Labs   Lab 09/27/22  0316 09/26/22  0336  09/25/22  0329 09/24/22  0401   Sodium 142 143 143 141   Potassium 4.8 5.1 4.6 4.8   Chloride 112* 113* 113* 113*   CO2 24 23 24 24    BUN 44* 41* 33* 37*   Creatinine 1.6* 1.8* 1.5 1.6*   Glucose 89 91 119* 80   Calcium 9.0 9.0 9.7 9.7   Magnesium 2.1 2.0  --   --        Recent Labs   Lab 09/27/22  0316 09/26/22  0336 09/24/22  0401   ALT 9 8 12    AST (SGOT) 14 15 23    Bilirubin, Total 0.5 0.5 0.8   Albumin 3.1* 3.0* 3.5   Alkaline Phosphatase 57 57 58                   Microbiology Results (last 15 days)       ** No results found for the last 360 hours. **             RADIOLOGY     XR Femur Right AP And Lateral    Result Date: 09/24/2022  Healed fracture of the right distal femoral diaphysis status post ORIF with good alignment. The hairline fracture distal to this noted on CT scan is not visible on x-ray. Lanier Ensign, MD 09/24/2022 5:10 PM    CT Knee Right without Contrast    Result Date: 09/23/2022  1.Nondisplaced hairline fracture of the right distal femoral metadiaphysis anterolaterally; there may be some early callus formation at this fracture site. 2.Healed fracture of the femoral diaphysis status post ORIF, partially covered on this study. 3.Chronic fragmentation of the superomedial pole of the patella. 4.Intact right total knee arthroplasty. 5.Osteoporosis. Lanier Ensign, MD 09/23/2022 12:38 PM   Echo Results       None          Results for orders placed or performed during the hospital encounter of 06/18/22   CT Head WO Contrast    Narrative    HISTORY: Unwitnessed fall. Posttraumatic headache and weakness. Patient is  on anticoagulation.    COMPARISON: Outside Brain MRI on 09/03/2020.    TECHNIQUE: CT of the head performed without intravenous contrast. The  following dose reduction techniques were utilized: automated exposure  control and/or adjustment of the mA and/or KV according to patient size,  and the use of an iterative reconstruction technique.    CONTRAST: None.    FINDINGS:  There is no acute  hemorrhage. The ventricles and subarachnoid spaces are  proportionately enlarged reflecting volume loss.  The gray-white matter  differentiation is preserved. There is a small chronic lacunar infarct in  right cerebellum. There is moderate periventricular hypodensity most  consistent with chronic small vessel ischemic change. There is no mass  effect or midline shift. The basal cisterns are patent. Intracranial  vascular calcifications are present.    No suspicious or aggressive osseous lesion is identified. The imaged  paranasal sinuses and  mastoids are clear.         Impression       1.No CT evidence of acute intracranial hemorrhage, herniation, or  hydrocephalus.   2.Moderate chronic small vessel ischemic changes.      Nelya Ebadirad  06/18/2022 9:37 AM       Signed,  Melina Fiddler, MD  12:03 PM 09/27/2022

## 2022-09-27 NOTE — Plan of Care (Signed)
Problem: Moderate/High Fall Risk Score >5  Goal: Patient will remain free of falls  Outcome: Progressing  Flowsheets  Taken 09/25/2022 1454 by Wess Botts, RN  High (Greater than 13):   HIGH-Consider use of low bed   HIGH-Initiate use of floor mats as appropriate   HIGH-Pharmacy to initiate evaluation and intervention per protocol   HIGH-Apply yellow "Fall Risk" arm band   HIGH-Bed alarm on at all times while patient in bed   HIGH-Visual cue at entrance to patient's room  Taken 09/24/2022 0521 by Dairl Ponder, RN  VH Moderate Risk (6-13): Use of floor mat     Problem: Compromised Activity/Mobility  Goal: Activity/Mobility Interventions  Outcome: Progressing  Flowsheets (Taken 09/26/2022 2200)  Activity/Mobility Interventions: Pad bony prominences, TAP Seated positioning system when OOB, Promote PMP, Reposition q 2 hrs / turn clock, Offload heels     Problem: Compromised Friction/Shear  Goal: Friction and Shear Interventions  Outcome: Progressing  Flowsheets (Taken 09/26/2022 2200)  Friction and Shear Interventions: Pad bony prominences, Off load heels, HOB 30 degrees or less unless contraindicated, Consider: TAP seated positioning, Heel foams

## 2022-09-28 ENCOUNTER — Inpatient Hospital Stay (INDEPENDENT_AMBULATORY_CARE_PROVIDER_SITE_OTHER): Payer: Medicare Other | Admitting: Student in an Organized Health Care Education/Training Program

## 2022-09-28 LAB — LAB USE ONLY - CBC WITH DIFFERENTIAL
Absolute Basophils: 0.02 10*3/uL (ref 0.00–0.08)
Absolute Eosinophils: 0.13 10*3/uL (ref 0.00–0.44)
Absolute Immature Granulocytes: 0.01 10*3/uL (ref 0.00–0.07)
Absolute Lymphocytes: 1.98 10*3/uL (ref 0.42–3.22)
Absolute Monocytes: 0.44 10*3/uL (ref 0.21–0.85)
Absolute Neutrophils: 1.82 10*3/uL (ref 1.10–6.33)
Absolute nRBC: 0 10*3/uL (ref <=0.00)
Basophils %: 0.5 %
Eosinophils %: 3 %
Hematocrit: 36.8 % — ABNORMAL LOW (ref 37.6–49.6)
Hemoglobin: 12.1 g/dL — ABNORMAL LOW (ref 12.5–17.1)
Immature Granulocytes %: 0.2 %
Lymphocytes %: 45 %
MCH: 33.8 pg — ABNORMAL HIGH (ref 25.1–33.5)
MCHC: 32.9 g/dL (ref 31.5–35.8)
MCV: 102.8 fL — ABNORMAL HIGH (ref 78.0–96.0)
MPV: 11.1 fL (ref 8.9–12.5)
Monocytes %: 10 %
Neutrophils %: 41.3 %
Platelet Count: 86 10*3/uL — ABNORMAL LOW (ref 142–346)
Preliminary Absolute Neutrophil Count: 1.82 10*3/uL (ref 1.10–6.33)
RBC: 3.58 10*6/uL — ABNORMAL LOW (ref 4.20–5.90)
RDW: 12 % (ref 11–15)
WBC: 4.4 10*3/uL (ref 3.10–9.50)
nRBC %: 0 /100 WBC (ref <=0.0)

## 2022-09-28 LAB — COMPREHENSIVE METABOLIC PANEL
ALT: 9 U/L (ref 0–55)
AST (SGOT): 17 U/L (ref 5–41)
Albumin/Globulin Ratio: 0.8 — ABNORMAL LOW (ref 0.9–2.2)
Albumin: 3.3 g/dL — ABNORMAL LOW (ref 3.5–5.0)
Alkaline Phosphatase: 61 U/L (ref 37–117)
Anion Gap: 8 (ref 5.0–15.0)
BUN: 39 mg/dL — ABNORMAL HIGH (ref 9–28)
Bilirubin, Total: 0.7 mg/dL (ref 0.2–1.2)
CO2: 23 mEq/L (ref 17–29)
Calcium: 9.3 mg/dL (ref 7.9–10.2)
Chloride: 111 mEq/L (ref 99–111)
Creatinine: 1.4 mg/dL (ref 0.5–1.5)
GFR: 46.3 mL/min/{1.73_m2} — ABNORMAL LOW (ref >=60.0)
Globulin: 4.2 g/dL — ABNORMAL HIGH (ref 2.0–3.6)
Glucose: 93 mg/dL (ref 70–100)
Potassium: 4.9 mEq/L (ref 3.5–5.3)
Protein, Total: 7.5 g/dL (ref 6.0–8.3)
Sodium: 142 mEq/L (ref 135–145)

## 2022-09-28 LAB — MAGNESIUM: Magnesium: 2 mg/dL (ref 1.6–2.6)

## 2022-09-28 LAB — PHOSPHORUS: Phosphorus: 3.1 mg/dL (ref 2.3–4.7)

## 2022-09-28 MED ORDER — SACUBITRIL-VALSARTAN 24-26 MG PO TABS
0.5000 | ORAL_TABLET | Freq: Two times a day (BID) | ORAL | Status: DC
Start: 2022-09-28 — End: 2022-09-29
  Administered 2022-09-28 – 2022-09-29 (×2): 0.5 via ORAL
  Filled 2022-09-28 (×2): qty 1

## 2022-09-28 NOTE — Progress Notes (Signed)
D/C DELAY pending [facility]    CM made follow up with pt's son and da regarding SNF choice and they are now agreeable to [facility].  CM made call to [facility] Admissions staff and spoke with [facility] [phone] who stated they will have a bed tomorrow 09/29/22.  CM provided an update to pt's son and will continue to coordinate all d/c needs.

## 2022-09-28 NOTE — Plan of Care (Signed)
Patient alert to self. Room air.VSS. Patient denies any pain  nausea vomiting dizziness and chest pain in my shift. Scheduled medication given crashed with apple sauce.Patient repositioned every 2 hours. patient educated plan of care and about medication. Safety bundle in place call bell was with in reach. Plan of care will continues.  Problem: Moderate/High Fall Risk Score >5  Goal: Patient will remain free of falls  Flowsheets (Taken 09/27/2022 1523 by Ernestina Columbia, RN)  High (Greater than 13):   HIGH-Bed alarm on at all times while patient in bed   HIGH-Initiate use of floor mats as appropriate   HIGH-Consider use of low bed   HIGH-Apply yellow "Fall Risk" arm band     Problem: Pain interferes with ability to perform ADL  Goal: Pain at adequate level as identified by patient  Outcome: Progressing     Problem: Side Effects from Pain Analgesia  Goal: Patient will experience minimal side effects of analgesic therapy  Flowsheets (Taken 09/27/2022 1523 by Ernestina Columbia, RN)  Patient will experience minimal side effects of analgesic therapy: Monitor/assess patient's respiratory status (RR depth, effort, breath sounds)     Problem: Compromised Sensory Perception  Goal: Sensory Perception Interventions  Outcome: Progressing     Problem: Compromised Moisture  Goal: Moisture level Interventions  Flowsheets (Taken 09/27/2022 2220)  Moisture level Interventions: Moisture wicking products, Moisture barrier cream     Problem: Compromised Activity/Mobility  Goal: Activity/Mobility Interventions  Outcome: Progressing     Problem: Compromised Nutrition  Goal: Nutrition Interventions  Flowsheets (Taken 09/27/2022 2220)  Nutrition Interventions: Discuss nutrition at Rounds, I&Os, Document % meal eaten, Daily weights     Problem: Compromised Friction/Shear  Goal: Friction and Shear Interventions  Outcome: Progressing     Problem: Hemodynamic Status: Cardiac  Goal: Stable vital signs and fluid balance  Flowsheets (Taken  09/27/2022 1523 by Ernestina Columbia, RN)  Stable vital signs and fluid balance:   Assess signs and symptoms associated with cardiac rhythm changes   Monitor lab values

## 2022-09-28 NOTE — Nursing Progress Note (Signed)
4 eyes in 4 hours pressure injury assessment note:      Completed with: day shift nurse RN  christine  Unit & Time admitted:              Bony Prominences: Check appropriate box; if wound is present enter wound assessment in LDA     Occiput:                 [x] WNL  []  Wound present  Face:                     [x] WNL  []  Wound present  Ears:                      [x] WNL  []  Wound present  Spine:                    [x] WNL  []  Wound present  Shoulders:             [x] WNL  []  Wound present  Elbows:                  [x] WNL  []  Wound present  Sacrum/coccyx:     [x] WNL  []  Wound present  Ischial Tuberosity:  [x] WNL  []  Wound present  Trochanter/Hip:      [x] WNL  []  Wound present  Knees:                   [x] WNL  []  Wound present  Ankles:                   [x] WNL  []  Wound present  Heels:                    [x] WNL  []  Wound present  Other pressure areas:  []  Wound location       Device related: []  Device name:         LDA completed if wound present: yes/no  Consult WOCN if necessary    Other skin related issues, ie tears, rash, etc, document in Integumentary flowsheet

## 2022-09-28 NOTE — Plan of Care (Signed)
NURSING SHIFT NOTE     Patient: Rick [name]  Day: 4      SHIFT EVENTS     Shift Narrative/Significant Events (PRN med administration, fall, RRT, etc.):     Patient alert and oriented to self.  On RA, VSS no distress noted. Medication given as prescribed. Repositioned as tolerated.Denied any pain, N/V   Safety and fall precautions remain in place. Purposeful rounding completed. Will continue monitor and make pt comfortable.          ASSESSMENT     Changes in assessment from patient's baseline this shift:    Neuro: No  CV: No  Pulm: No  Peripheral Vascular: No  HEENT: No  GI: No  BM during shift: No, Last BM: Last BM Date: 09/26/22  GU: No   Integ: No  MS: No    Pain: None  Pain Interventions: None  Medications Utilized:     Mobility: PMP Activity: Step 3 - Bed Mobility of Distance Walked (ft) (Step 6,7): 0 Feet           Lines     Patient Lines/Drains/Airways Status       Active Lines, Drains and Airways       Name Placement date Placement time Site Days    Peripheral IV 09/23/22 24 G Standard Anterior;Left Forearm 09/23/22  2356  Forearm  4    External Urinary Catheter 09/23/22  1730  --  4                         VITAL SIGNS     Vitals:    09/28/22 1543   BP: 109/45   Pulse: (!) 49   Resp:    Temp: 98.8 F (37.1 C)   SpO2: 97%       Temp  Min: 97.7 F (36.5 C)  Max: 98.8 F (37.1 C)  Pulse  Min: 44  Max: 72  Resp  Min: 16  Max: 18  BP  Min: 107/54  Max: 145/68  SpO2  Min: 95 %  Max: 97 %      Intake/Output Summary (Last 24 hours) at 09/28/2022 1556  Last data filed at 09/28/2022 1400  Gross per 24 hour   Intake 370 ml   Output 1750 ml   Net -1380 ml          CARE PLAN       Problem: Compromised Sensory Perception  Goal: Sensory Perception Interventions  Flowsheets (Taken 09/28/2022 1554)  Sensory Perception Interventions: Offload heels, Pad bony prominences, Reposition q 2hrs/turn Clock, Q2 hour skin assessment under devices if present     Problem: Compromised Activity/Mobility  Goal: Activity/Mobility  Interventions  Flowsheets (Taken 09/28/2022 1554)  Activity/Mobility Interventions: Pad bony prominences, TAP Seated positioning system when OOB, Promote PMP, Reposition q 2 hrs / turn clock, Offload heels     Problem: Compromised Friction/Shear  Goal: Friction and Shear Interventions  Flowsheets (Taken 09/28/2022 1554)  Friction and Shear Interventions: Pad bony prominences, Off load heels, HOB 30 degrees or less unless contraindicated, Consider: TAP seated positioning, Heel foams     Problem: Hemodynamic Status: Cardiac  Goal: Stable vital signs and fluid balance  Flowsheets (Taken 09/28/2022 1554)  Stable vital signs and fluid balance:   Assess signs and symptoms associated with cardiac rhythm changes   Monitor lab values

## 2022-09-28 NOTE — Progress Notes (Signed)
Attempted to call the son Mr. Schlichte [phone]  at 2:30 PM to give update about the clinical condition of his father. left voicemail.  Will try again later

## 2022-09-28 NOTE — Progress Notes (Signed)
4 eyes in 4 hours pressure injury assessment note:      Completed with: Roman RN   Unit & Time admitted:              Bony Prominences: Check appropriate box; if wound is present enter wound assessment in LDA     Occiput:                 [x] WNL  []  Wound present  Face:                     [x] WNL  []  Wound present  Ears:                      [x] WNL  []  Wound present  Spine:                    [x] WNL  []  Wound present  Shoulders:             [x] WNL  []  Wound present  Elbows:                  [x] WNL  []  Wound present  Sacrum/coccyx:     [x] WNL  []  Wound present  Ischial Tuberosity:  [x] WNL  []  Wound present  Trochanter/Hip:      [x] WNL  []  Wound present  Knees:                   [x] WNL  []  Wound present  Ankles:                   [x] WNL  []  Wound present  Heels:                    [x] WNL  []  Wound present  Other pressure areas:  []  Wound location       Device related: []  Device name:         LDA completed if wound present: yes/no  Consult WOCN if necessary    Other skin related issues, ie tears, rash, etc, document in Integumentary flowsheet: scattered scar

## 2022-09-28 NOTE — Discharge Summary (Addendum)
[facility]      Patient: Rick [name]  Admission Date: 09/23/2022   DOB: 02/16/1925  Discharge Date: 09/28/2022    MRN: [id number]  Discharge Attending: Jenetta Downer, MD     Referring Physician: Erasmo Score, MD  PCP: Erasmo Score, MD       DISCHARGE SUMMARY     Discharge Information   Admission Diagnosis:   Closed fracture of right femur, unspecified fracture morphology, unspecified portion of femur, initial encounter    Discharge Diagnosis:   Active [name] Problems    Diagnosis    Closed fracture of right femur, unspecified fracture morphology, unspecified portion of femur, initial encounter    nondisplaced unicortical right distal femoral metadiaphyseal periprosthetic fracture   AKI on CKD   Failure to thrive   Bradycardia  Intermittent Mobitz type II AV block     Chronic combined systolic and diastolic heart failure with reduced EF (stable volume status)  Known cardiomyopathy with improved EF from 25-30% to of 41% on echo done 08/2022  Coronary artery disease s/p CABG ( Denied CP/sob   Dementia   Chronic thrombocytopenia   History of PE   Enlarged prostate \      Admission Condition: Guarded  Discharge Condition: Stable  Consultants: Orthopedic surgery, cardiology, palliative care  Functional Status: Weightbearing on the right leg as tolerated  Discharged to: [name] Course (4 Days)     From HPI,      Rick [name] is a 89 y.o. male with a PMHx of dementia, MGUS, HFrEF with EF 30%, CAD, HTN, HLD, intermittent Mobitz type II AV block who presented with right knee pain.     Patient presents in the setting of right knee pain after recent fall several days ago.  He normally ambulates with a walker.  He has been having extreme difficulty walking and bearing weight with his walker since the fall.  Family initially believed that his symptoms might improve with time but decided to bring him to the ED when he remained unable to walk.  Patient reports no pain  at rest however any movement of the knee causes significant pain.     In the ED, patient intermittently bradycardic with vital signs otherwise within normal limits.  No labs were obtained in the ED today.  CT right knee without contrast showed nondisplaced hairline fracture of the right distal femoral metadiaphysis, healed fracture of the femoral diaphysis status post ORIF, chronic fragmentation of the superior medial pole of the patella.     Patient was recently admitted to [facility] 08/24/2022 to 08/27/2022 for for hypotension.  During this hospitalization he was found to be bradycardic with a 3-second pause and intermittent Mobitz type II AV block.  He was evaluated by cardiology who recommended EP consultation.  EP evaluated the patient and recommended no new intervention at this point.  It was discussed with the patient's son that pacemaker placement be avoided given patient's dementia and advanced age.    During his stay, seen and eval by cardiology for intermittent AV block and bradycardia.  He was placed on telemetry monitoring was followed during his stay by cardiology and primary team he remained asymptomatic cardiology recommend to continue to avoid AV nodal blocking agent and currently does not need any further intervention.  Continue his GDMT as tolerated.  Seen and evaluated by orthopedic surgery who recommend no surgical intervention at this point  and mended for weightbearing as tolerated on the right lower extremity.  PT OT treatment.  His kidney function elevated and his GDMT was held for 24 hours and kidney function improved will resume GDMT slowly as tolerated.  He remained hemodynamically stable and he is ready for discharge to SNF today.  Will be discharged only with Entresto.  Will need to repeat renal function in 1 week and resume spironolactone if kidney function and potassium level is at baseline.         Progress Note/Physical Exam at Discharge     Subjective:Patient is stable for  discharge.    Vitals:    09/28/22 0328 09/28/22 0710 09/28/22 0826 09/28/22 1115   BP: 145/68 107/54  123/47   Pulse: 72 (!) 56 63 (!) 44   Resp: 16 16  17    Temp: 98.1 F (36.7 C) 98.4 F (36.9 C)  98.6 F (37 C)   TempSrc: Oral Oral  Oral   SpO2: 95% 95% 96% 97%   Weight: 72.4 kg (159 lb 9.8 oz)      Height:               General: NAD, AAOx3  HEENT: Sclera anicteric, no conjunctival injection, OP: Clear, MMM  Neck: Supple, FROM, no LAD  Cardiovascular: RRR, no m/r/g  Lungs: CTAB, no w/r/r  Abdomen: Soft, +BS, NT/ND, no masses, no g/r  Extremities: No C/C/E  Skin: No rashes or lesions noted  Neuro: No Focal neurological deficits       Diagnostics     Labs/Studies Pending at Discharge: No    Last Labs   Recent Labs   Lab 09/28/22  0331 09/27/22  0316 09/26/22  0336   WBC 4.40 4.53 4.28   RBC 3.58* 3.24* 3.21*   Hemoglobin 12.1* 11.0* 10.9*   Hematocrit 36.8* 34.1* 33.8*   MCV 102.8* 105.2* 105.3*   Platelet Count 86* 87* 82*       Recent Labs   Lab 09/28/22  0331 09/27/22  0316 09/26/22  0336 09/25/22  0329 09/24/22  0401   Sodium 142 142 143 143 141   Potassium 4.9 4.8 5.1 4.6 4.8   Chloride 111 112* 113* 113* 113*   CO2 23 24 23 24 24    BUN 39* 44* 41* 33* 37*   Creatinine 1.4 1.6* 1.8* 1.5 1.6*   Glucose 93 89 91 119* 80   Calcium 9.3 9.0 9.0 9.7 9.7   Magnesium 2.0 2.1 2.0  --   --        Microbiology Results (last 15 days)       ** No results found for the last 360 hours. **             Patient Instructions   Discharge Diet: Regular diet  Discharge Activity: Weightbearing as tolerated on the right lower extremity    Follow Up Appointment:       Discharge Medications:     Medication List        ASK your doctor about these medications      acetaminophen 500 MG tablet  Commonly known as: TYLENOL     docusate sodium 100 MG capsule  Commonly known as: COLACE     empagliflozin 10 MG tablet  Commonly known as: JARDIANCE  Take 1 tablet (10 mg) by mouth daily     furosemide 20 MG tablet  Commonly known as:  LASIX  Take 1 tablet (20 mg) by mouth daily  multivitamin capsule     rosuvastatin 20 MG tablet  Commonly known as: CRESTOR  Take 1 tablet (20 mg) by mouth daily  Ask about: Which instructions should I use?     sacubitril-valsartan 24-26 MG Tabs per tablet  Commonly known as: ENTRESTO  Take 0.5 tablets by mouth 2 (two) times daily     spironolactone 25 MG tablet  Commonly known as: ALDACTONE  Take 0.5 tablets (12.5 mg) by mouth daily     tamsulosin 0.4 MG Caps  Commonly known as: FLOMAX  Take 1 capsule (0.4 mg) by mouth every evening     Vitamin D (Cholecalciferol) 50 MCG (2000 UT) Caps               Where to Get Your Medications        These medications were sent to [facility] [address] [place] - [name]  [address], [place] [address]      Phone: [phone]   rosuvastatin 20 MG tablet          Time spent examining patient, discussing with patient/family regarding [name] course, chart review, reconciling medications and discharge planning: 40 minutes.    Signed,  Melina Fiddler, MD    2:29 PM 09/28/2022

## 2022-09-29 LAB — LAB USE ONLY - CBC WITH DIFFERENTIAL
Absolute Basophils: 0.02 10*3/uL (ref 0.00–0.08)
Absolute Eosinophils: 0.17 10*3/uL (ref 0.00–0.44)
Absolute Immature Granulocytes: 0.02 10*3/uL (ref 0.00–0.07)
Absolute Lymphocytes: 1.3 10*3/uL (ref 0.42–3.22)
Absolute Monocytes: 0.48 10*3/uL (ref 0.21–0.85)
Absolute Neutrophils: 2.78 10*3/uL (ref 1.10–6.33)
Absolute nRBC: 0 10*3/uL (ref <=0.00)
Basophils %: 0.4 %
Eosinophils %: 3.6 %
Hematocrit: 36 % — ABNORMAL LOW (ref 37.6–49.6)
Hemoglobin: 11.9 g/dL — ABNORMAL LOW (ref 12.5–17.1)
Immature Granulocytes %: 0.4 %
Lymphocytes %: 27.3 %
MCH: 34.5 pg — ABNORMAL HIGH (ref 25.1–33.5)
MCHC: 33.1 g/dL (ref 31.5–35.8)
MCV: 104.3 fL — ABNORMAL HIGH (ref 78.0–96.0)
MPV: 11.7 fL (ref 8.9–12.5)
Monocytes %: 10.1 %
Neutrophils %: 58.2 %
Platelet Count: 95 10*3/uL — ABNORMAL LOW (ref 142–346)
Preliminary Absolute Neutrophil Count: 2.78 10*3/uL (ref 1.10–6.33)
RBC: 3.45 10*6/uL — ABNORMAL LOW (ref 4.20–5.90)
RDW: 12 % (ref 11–15)
WBC: 4.77 10*3/uL (ref 3.10–9.50)
nRBC %: 0 /100 WBC (ref <=0.0)

## 2022-09-29 LAB — COMPREHENSIVE METABOLIC PANEL
ALT: 9 U/L (ref 0–55)
AST (SGOT): 15 U/L (ref 5–41)
Albumin/Globulin Ratio: 0.8 — ABNORMAL LOW (ref 0.9–2.2)
Albumin: 3.2 g/dL — ABNORMAL LOW (ref 3.5–5.0)
Alkaline Phosphatase: 62 U/L (ref 37–117)
Anion Gap: 5 (ref 5.0–15.0)
BUN: 47 mg/dL — ABNORMAL HIGH (ref 9–28)
Bilirubin, Total: 0.4 mg/dL (ref 0.2–1.2)
CO2: 26 mEq/L (ref 17–29)
Calcium: 9.4 mg/dL (ref 7.9–10.2)
Chloride: 113 mEq/L — ABNORMAL HIGH (ref 99–111)
Creatinine: 1.4 mg/dL (ref 0.5–1.5)
GFR: 46.3 mL/min/{1.73_m2} — ABNORMAL LOW (ref >=60.0)
Globulin: 4 g/dL — ABNORMAL HIGH (ref 2.0–3.6)
Glucose: 95 mg/dL (ref 70–100)
Potassium: 5.2 mEq/L (ref 3.5–5.3)
Protein, Total: 7.2 g/dL (ref 6.0–8.3)
Sodium: 144 mEq/L (ref 135–145)

## 2022-09-29 LAB — PHOSPHORUS: Phosphorus: 3.1 mg/dL (ref 2.3–4.7)

## 2022-09-29 LAB — MAGNESIUM: Magnesium: 2.2 mg/dL (ref 1.6–2.6)

## 2022-09-29 MED ORDER — ACETAMINOPHEN 325 MG PO TABS
650.0000 mg | ORAL_TABLET | Freq: Three times a day (TID) | ORAL | Status: AC | PRN
Start: 2022-09-29 — End: ?

## 2022-09-29 MED ORDER — ROSUVASTATIN CALCIUM 10 MG PO TABS
10.0000 mg | ORAL_TABLET | Freq: Every evening | ORAL | Status: DC
Start: 2022-09-29 — End: 2022-12-18

## 2022-09-29 NOTE — Progress Notes (Signed)
Pt will d/c to [facility] as requested by family.  CM made referral to [facility] for [facility] with an ETA of 12 PM.  CM also provided notice to pt's son Mr. Gorden, Daughter in [place], Barrister's clerk. No further needs identified at this time.   09/29/22 1111   Discharge Disposition   Patient preference/choice provided? Yes   Physical Discharge Disposition SNF   Was patient sent with auth pending to the post-acute facility? Yes   Was pending auth transfer approved by CM lead, Special educational needs teacher? No   Receiving facility, unit and room number: [facility] Room 200   Nursing report phone number: [phone] Ext 280   Mode of Transportation Ambulance   Pick up time 12:00 Pm   Patient/Family/POA notified of transfer plan Yes   Patient agreeable to discharge plan/expected d/c date? Yes   Family/POA agreeable to discharge plan/expected d/c date? Yes   Bedside nurse notified of transport plan? Yes   CM Interventions   Multidisciplinary rounds/family meeting before d/c? Yes   Medicare Checklist   Is this a Medicare patient? Yes

## 2022-09-29 NOTE — Discharge Summary (Signed)
No significant interval clinical changes in the last 24 hours since the below summary was originally dictated.  [facility]      Patient: Rick [name] [facility]  Admission Date: 09/23/2022   DOB: December 03, 1924  Discharge Date: 09/29/2022    MRN: [id number]  Discharge Attending: Delaine Lame, MD     Referring Physician: Erasmo Score, MD  PCP: Erasmo Score, MD       DISCHARGE SUMMARY     Discharge Information   Admission Diagnosis:   Closed fracture of right femur, unspecified fracture morphology, unspecified portion of femur, initial encounter    Discharge Diagnosis:   Active [facility] Problems    Diagnosis    Closed fracture of right femur, unspecified fracture morphology, unspecified portion of femur, initial encounter    nondisplaced unicortical right distal femoral metadiaphyseal periprosthetic fracture   AKI on CKD   Failure to thrive   Bradycardia  Intermittent Mobitz type II AV block     Chronic combined systolic and diastolic heart failure with reduced EF (stable volume status)  Known cardiomyopathy with improved EF from 25-30% to of 41% on echo done 08/2022  Coronary artery disease s/p CABG ( Denied CP/sob   Dementia   Chronic thrombocytopenia   History of PE   Enlarged prostate \      Admission Condition: Guarded  Discharge Condition: Stable  Consultants: Orthopedic surgery, cardiology, palliative care  Functional Status: Weightbearing on the right leg as tolerated  Discharged to: [facility] Course (5 Days)     From HPI,      Rick [name] is a 89 y.o. male with a PMHx of dementia, MGUS, HFrEF with EF 30%, CAD, HTN, HLD, intermittent Mobitz type II AV block who presented with right knee pain.     Patient presents in the setting of right knee pain after recent fall several days ago.  He normally ambulates with a walker.  He has been having extreme difficulty walking and bearing weight with his walker since the fall.  Family initially believed that his symptoms might  improve with time but decided to bring him to the ED when he remained unable to walk.  Patient reports no pain at rest however any movement of the knee causes significant pain.     In the ED, patient intermittently bradycardic with vital signs otherwise within normal limits.  No labs were obtained in the ED today.  CT right knee without contrast showed nondisplaced hairline fracture of the right distal femoral metadiaphysis, healed fracture of the femoral diaphysis status post ORIF, chronic fragmentation of the superior medial pole of the patella.     Patient was recently admitted to [facility] 08/24/2022 to 08/27/2022 for for hypotension.  During this hospitalization he was found to be bradycardic with a 3-second pause and intermittent Mobitz type II AV block.  He was evaluated by cardiology who recommended EP consultation.  EP evaluated the patient and recommended no new intervention at this point.  It was discussed with the patient's son that pacemaker placement be avoided given patient's dementia and advanced age.    During his stay, seen and eval by cardiology for intermittent AV block and bradycardia.  He was placed on telemetry monitoring was followed during his stay by cardiology and primary team he remained asymptomatic cardiology recommend to continue to avoid AV nodal blocking agent and currently does not need any further intervention.  Continue his GDMT as tolerated.  Seen and evaluated by orthopedic surgery who recommend no surgical intervention at this point and mended for weightbearing as tolerated on the right lower extremity.  PT OT treatment.  His kidney function elevated and his GDMT was held for 24 hours and kidney function improved will resume GDMT slowly as tolerated.  He remained hemodynamically stable and he is ready for discharge to SNF today.  Will be discharged only with Entresto.  Will need to repeat renal function in 1 week and resume spironolactone if kidney function and potassium  level is at baseline.         Progress Note/Physical Exam at Discharge     Subjective:Patient is stable for discharge.    Vitals:    09/29/22 0500 09/29/22 0707 09/29/22 1022 09/29/22 1107   BP:  160/68 124/53 104/44   Pulse:  62 (!) 51 (!) 52   Resp:  17     Temp:  97.5 F (36.4 C)  97.9 F (36.6 C)   TempSrc:  Oral  Oral   SpO2:  98%  95%   Weight: 73 kg (160 lb 15 oz)      Height:               General: NAD, AAOx3  HEENT: Sclera anicteric, no conjunctival injection, OP: Clear, MMM  Neck: Supple, FROM, no LAD  Cardiovascular: RRR, no m/r/g  Lungs: CTAB, no w/r/r  Abdomen: Soft, +BS, NT/ND, no masses, no g/r  Extremities: No C/C/E  Skin: No rashes or lesions noted  Neuro: No Focal neurological deficits       Diagnostics     Labs/Studies Pending at Discharge: No    Last Labs   Recent Labs   Lab 09/29/22  0341 09/28/22  0331 09/27/22  0316   WBC 4.77 4.40 4.53   RBC 3.45* 3.58* 3.24*   Hemoglobin 11.9* 12.1* 11.0*   Hematocrit 36.0* 36.8* 34.1*   MCV 104.3* 102.8* 105.2*   Platelet Count 95* 86* 87*       Recent Labs   Lab 09/29/22  0341 09/28/22  0331 09/27/22  0316 09/26/22  0336 09/25/22  0329   Sodium 144 142 142 143 143   Potassium 5.2 4.9 4.8 5.1 4.6   Chloride 113* 111 112* 113* 113*   CO2 26 23 24 23 24    BUN 47* 39* 44* 41* 33*   Creatinine 1.4 1.4 1.6* 1.8* 1.5   Glucose 95 93 89 91 119*   Calcium 9.4 9.3 9.0 9.0 9.7   Magnesium 2.2 2.0 2.1 2.0  --        Microbiology Results (last 15 days)       ** No results found for the last 360 hours. **             Patient Instructions   Discharge Diet: Regular diet  Discharge Activity: Weightbearing as tolerated on the right lower extremity    Follow Up Appointment:   Follow-up Information       Erasmo Score, MD .    Specialty: Family Medicine  Contact information:  [address]  [place] [address]  [phone]                              Discharge Medications:     Medication List        START taking these medications      rosuvastatin 10 MG  tablet  Commonly  known as: CRESTOR  Take 1 tablet (10 mg) by mouth nightly            CHANGE how you take these medications      acetaminophen 325 MG tablet  Commonly known as: TYLENOL  Take 2 tablets (650 mg) by mouth 3 (three) times daily as needed for Fever (temperature greater than 37.5 C)  What changed:   medication strength  how much to take  when to take this  reasons to take this            CONTINUE taking these medications      docusate sodium 100 MG capsule  Commonly known as: COLACE     empagliflozin 10 MG tablet  Commonly known as: JARDIANCE  Take 1 tablet (10 mg) by mouth daily     sacubitril-valsartan 24-26 MG Tabs per tablet  Commonly known as: ENTRESTO  Take 0.5 tablets by mouth 2 (two) times daily     spironolactone 25 MG tablet  Commonly known as: ALDACTONE  Take 0.5 tablets (12.5 mg) by mouth daily     tamsulosin 0.4 MG Caps  Commonly known as: FLOMAX  Take 1 capsule (0.4 mg) by mouth every evening     Vitamin D (Cholecalciferol) 50 MCG (2000 UT) Caps            STOP taking these medications      furosemide 20 MG tablet  Commonly known as: LASIX     multivitamin capsule               Where to Get Your Medications        Information about where to get these medications is not yet available    Ask your nurse or doctor about these medications  acetaminophen 325 MG tablet  rosuvastatin 10 MG tablet          Time spent examining patient, discussing with patient/family regarding hospital course, chart review, reconciling medications and discharge planning: 40 minutes.    Signed,  Buelah Manis, MD    12:09 PM 09/29/2022

## 2022-09-29 NOTE — Plan of Care (Signed)
NURSING SHIFT NOTE     Patient: Rick [name]  Day: 5      SHIFT EVENTS     Shift Narrative/Significant Events (PRN med administration, fall, RRT, etc.):     - A&O to self, delayed response and hard to understand, on RA  - Denies chest pain, dizziness, SOB, N/V  - VSS, high BP and responded to med, scheduled meds given crushed in apple sauce, tolerated well.   - bed mobility, external cath in place. Plan of care explained, pt verbalized understanding.   - Pt d/c to [facility]. Report given to Bissnu, RN @1125 . PIV removed, catheter visualized and intact.     Safety and fall precautions remain in place. Purposeful rounding completed.          ASSESSMENT     Changes in assessment from patient's baseline this shift:    Neuro: No  CV: No  Pulm: No  Peripheral Vascular: No  HEENT: No  GI: No  BM during shift: Yes   , Last BM: Last BM Date: 09/28/22  GU: No   Integ: No  MS: No    Pain: None  Pain Interventions: Rest  Medications Utilized: n/a    Mobility: PMP Activity: Step 3 - Bed Mobility of Distance Walked (ft) (Step 6,7): 0 Feet           Lines     Patient Lines/Drains/Airways Status       Active Lines, Drains and Airways       Name Placement date Placement time Site Days    Peripheral IV 09/23/22 24 G Standard Anterior;Left Forearm 09/23/22  2356  Forearm  5    External Urinary Catheter 09/23/22  1730  --  5                         VITAL SIGNS     Vitals:    09/29/22 1022   BP: 124/53   Pulse: (!) 51   Resp:    Temp:    SpO2:        Temp  Min: 97 F (36.1 C)  Max: 98.8 F (37.1 C)  Pulse  Min: 44  Max: 62  Resp  Min: 17  Max: 17  BP  Min: 102/65  Max: 160/68  SpO2  Min: 97 %  Max: 99 %      Intake/Output Summary (Last 24 hours) at 09/29/2022 1035  Last data filed at 09/29/2022 0856  Gross per 24 hour   Intake 300 ml   Output 2000 ml   Net -1700 ml            CARE PLAN        Problem: Moderate/High Fall Risk Score >5  Goal: Patient will remain free of falls  Outcome:  Progressing  Flowsheets (Taken 09/29/2022 0800)  High (Greater than 13):   HIGH-Visual cue at entrance to patient's room   HIGH-Bed alarm on at all times while patient in bed   HIGH-Utilize chair pad alarm for patient while in the chair   HIGH-Apply yellow "Fall Risk" arm band   HIGH-Initiate use of floor mats as appropriate   HIGH-Consider use of low bed     Problem: Pain interferes with ability to perform ADL  Goal: Pain at adequate level as identified by patient  Outcome: Progressing  Flowsheets (Taken 09/27/2022 1523 by Ernestina Columbia, RN)  Pain at adequate level as identified by patient:  Assess for risk of opioid induced respiratory depression, including snoring/sleep apnea. Alert healthcare team of risk factors identified.   Identify patient comfort function goal     Problem: Hemodynamic Status: Cardiac  Goal: Stable vital signs and fluid balance  Outcome: Progressing  Flowsheets (Taken 09/28/2022 1554 by Arlester Marker, RN)  Stable vital signs and fluid balance:   Assess signs and symptoms associated with cardiac rhythm changes   Monitor lab values

## 2022-09-29 NOTE — Plan of Care (Signed)
Patient alert oriented times 4. Room air. VSS. Patient denies any pain  nausea vomiting dizziness and chest pain in my shift. Patient repositioned and cleaned. Scheduled medication given,patient educated plan of care and about medication. Safety bundle in place call bell was with in reach. Plan of care will continues.    Problem: Moderate/High Fall Risk Score >5  Goal: Patient will remain free of falls  Flowsheets (Taken 09/27/2022 1523 by Ernestina Columbia, RN)  High (Greater than 13):   HIGH-Bed alarm on at all times while patient in bed   HIGH-Initiate use of floor mats as appropriate   HIGH-Consider use of low bed   HIGH-Apply yellow "Fall Risk" arm band     Problem: Pain interferes with ability to perform ADL  Goal: Pain at adequate level as identified by patient  Outcome: Progressing     Problem: Side Effects from Pain Analgesia  Goal: Patient will experience minimal side effects of analgesic therapy  Flowsheets (Taken 09/27/2022 1523 by Ernestina Columbia, RN)  Patient will experience minimal side effects of analgesic therapy: Monitor/assess patient's respiratory status (RR depth, effort, breath sounds)     Problem: Compromised Sensory Perception  Goal: Sensory Perception Interventions  Outcome: Progressing     Problem: Compromised Moisture  Goal: Moisture level Interventions  Flowsheets (Taken 09/28/2022 2100)  Moisture level Interventions: Moisture wicking products, Moisture barrier cream     Problem: Compromised Activity/Mobility  Goal: Activity/Mobility Interventions  Outcome: Progressing     Problem: Compromised Nutrition  Goal: Nutrition Interventions  Flowsheets (Taken 09/28/2022 2100)  Nutrition Interventions: Discuss nutrition at Rounds, I&Os, Document % meal eaten, Daily weights     Problem: Compromised Friction/Shear  Goal: Friction and Shear Interventions  Outcome: Progressing     Problem: Hemodynamic Status: Cardiac  Goal: Stable vital signs and fluid balance  Outcome: Progressing

## 2022-09-29 NOTE — Nursing Progress Note (Signed)
4 eyes in 4 hours pressure injury assessment note:      Completed with: CHRISTINE RN  Unit & Time admitted:              Bony Prominences: Check appropriate box; if wound is present enter wound assessment in LDA     Occiput:                 [x] WNL  []  Wound present  Face:                     [x] WNL  []  Wound present  Ears:                      [x] WNL  []  Wound present  Spine:                    [x] WNL  []  Wound present  Shoulders:             [x] WNL  []  Wound present  Elbows:                  [x] WNL  []  Wound present  Sacrum/coccyx:     [x] WNL  []  Wound present  Ischial Tuberosity:  [x] WNL  []  Wound present  Trochanter/Hip:      [x] WNL  []  Wound present  Knees:                   [x] WNL  []  Wound present  Ankles:                   [x] WNL  []  Wound present  Heels:                    [x] WNL  []  Wound present  Other pressure areas:  []  Wound location       Device related: []  Device name:         LDA completed if wound present: yes/no  Consult WOCN if necessary    Other skin related issues, ie tears, rash, etc, document in Integumentary flowsheet

## 2022-09-29 NOTE — Progress Notes (Signed)
4 eyes in 4 hours pressure injury assessment note:      Completed with: Roman, RN    Bony Prominences: Check appropriate box; if wound is present enter wound assessment in LDA     Occiput:                 [x] WNL  []  Wound present  Face:                     [x] WNL  []  Wound present  Ears:                      [x] WNL  []  Wound present  Spine:                    [x] WNL  []  Wound present  Shoulders:             [x] WNL  []  Wound present  Elbows:                  [x] WNL  []  Wound present  Sacrum/coccyx:     [x] WNL  []  Wound present  Ischial Tuberosity:  [x] WNL  []  Wound present  Trochanter/Hip:      [x] WNL  []  Wound present  Knees:                   [x] WNL  []  Wound present  Ankles:                   [x] WNL  []  Wound present  Heels:                    [x] WNL  []  Wound present  Other pressure areas:  []  Wound location       Device related: []  Device name:         LDA completed if wound present: yes/no  Consult WOCN if necessary    Other skin related issues, ie tears, rash, etc, document in Integumentary flowsheet

## 2022-10-01 NOTE — Progress Notes (Signed)
AMBULATORY CARE MANAGEMENT Outreach Call    Hospital patient admitted to: [facility] Admission/Discharge Dates:   8/7- 8/13     Reason for admission to hospital: femur frx    Name: Rick [name]    ### Patient Details  Date of Birth: 09/09/1924  MRN: [id number]    ### Encounter Details  Arrival Date: 09/23/2022 11:08 AM EDT  Discharge Date: 09/29/2022 01:22 PM EDT  Encounter ID: [id number] TCM8/13/2024   1:22:00PM    ### Related interaction  [place] - TCM V2 Post Discharge Outreach (Post Discharge TCM V2 Outreach 1) ([email or web address]    ### Required Interventions and Feedback     Call Status         Call Status:     No Attempt (edited by JF on 10/01/2022 11:21 AM EDT)    Comments::     Patient received an automated post-discharge outreach call. Voicemail left requesting return call. No further action needed at this time. (edited by JF on 10/01/2022 11:21 AM EDT)      Rudolpho Sevin, RN, BSN, ACM-RN  Case Manager II  [facility]  [address]  [place], [place] [address]  (T) [phone]

## 2022-10-05 ENCOUNTER — Encounter (INDEPENDENT_AMBULATORY_CARE_PROVIDER_SITE_OTHER): Payer: Self-pay | Admitting: Cardiovascular Disease

## 2022-10-05 ENCOUNTER — Encounter (INDEPENDENT_AMBULATORY_CARE_PROVIDER_SITE_OTHER): Payer: Self-pay | Admitting: Student in an Organized Health Care Education/Training Program

## 2022-10-05 ENCOUNTER — Telehealth (INDEPENDENT_AMBULATORY_CARE_PROVIDER_SITE_OTHER): Payer: Medicare Other | Admitting: Cardiovascular Disease

## 2022-10-05 DIAGNOSIS — I5022 Chronic systolic (congestive) heart failure: Secondary | ICD-10-CM

## 2022-10-05 NOTE — Progress Notes (Signed)
CARDIOLOGY TELEHEALTH NOTE     Date:  10/05/2022      Rick [name]  DOB: Apr 19, 1924  MRN: [id number]    PCP: Erasmo Score, MD    ASSESSMENT AND PLAN     This is a 89 y.o. male with a history of CAD s/p CABG, s/p TAVR, HFrEF with LVEF of 25%, HLD, moderate pulm HTN, memory impairment on a telehealth visit for follow-up    Chronic HFrEF - LV EF 25% on most recent TTE.    Patient appears euvolemic on exam. Tolerating medications well and compliant.    GDMT: continue continue ARNI, MRA, SGLT2i.  Not on BB due to bradycardia.  Seen by EP who recommended no ICD given advanced age.  Provided education on dietary sodium (<2g/day), fluid restriction and medication compliance.     CAD-s/p CABG.  Asymptomatic.  Suggested low-dose aspirin if tolerated.  Continue statin..    S/p TAVR- most recent TTE shows normal TAVR function.    Mobitz type I AVB-seen by EP on 08/25/2022 during admission for bradycardia.  Given absence of symptoms and given advanced age, decision was made between EP and family to not pursue interventions.    No orders of the defined types were placed in this encounter.     Return in about 3 months (around 01/05/2023).    _____________________________    [name], MD, MPH, [facility]  Cardiology - [facility]    Tel.: [phone], Fax: [phone]  [address], [place], [place] [address]  [address], [place], [place] [address]  [address], [place], [place] [address]  _____________________________          HISTORY OF PRESENT ILLNESS     I had the pleasure of discussing about Mr. Rick [name] on video telehealth today.  He is a 89 y.o. male with the above medical history here for follow up.  He is currently in rehab and the video discussion was with his son.  No new symptoms since his discharge from the hospital.  His son mentions that they are considering pursuing cataract surgery and plan to come in for an office visit if they decide to go along with the surgery.    No  chest pain, dyspnea, orthopnea or PND. No peripheral edema. No palpitations, lightheadedness or syncope.  His BP is well controlled.      CARDIAC DIAGNOSTICS     ECG (indepedently reviewed by me): Sinus bradycardia, sec degree Mobitz type I but    Echocardiogram 04/09/21:   * The left ventricular cavity size is dilated.  Left ventricular wall  thickness is normal.  Left ventricular systolic function is severely decreased  with an estimated ejection fraction of 25-30%.    * The right ventricular cavity size is upper limits of normal in size.  Mildly decreased right ventricular systolic function.    * Severe bi-atrial dilation.    * There is a 29mm Sapien 3 TAVR valve in aortic position that is normally  functioning.    * There is severe tricuspid regurgitation.  Moderate pulmonary hypertension  with estimated right ventricular systolic pressure of 54 mmHg.    * There is moderate pulmonic valve regurgitation.    * There is mild to moderate mitral regurgitation.    * Elevated right atrial pressure.    * Compared to the prior study dated 08/25/2019 (personally reviewed),  tricuspid regurgitation increased. There are frequent PVC's throughout the  length of this study. Otherwise, no  significant changes.     TTE(09/05/2019):  Mildly dilated LV, LVEF 30%, mildly increased LVH,LVOT VTI is low consistent with low stroke volume (58mL) and cardiac output (3.3L/min).  Stroke volume index 34mL/m2, mildly dilated RV cavity with decreased systolic function, RV FAC 11.2%, RV S' 6.6cm/s.  TAPSE 16mm. known 29mm Sapien 3 TAVR valve in aortic position. It appears well seated. DVI 0.3 and EOA 1.3cm2. These values are derived from a low stroke volume (low LVOT VTI) and may reflect an underestimation of valve area due to low output state.  Mild MR, mild-moderate TR, moderate pHTN, estimated RVSP 46 mmHg, dilated ascending aorta compared to previous study 07/13/2019, TAVR hemodynamics have changed, but may be underestimated due to low output  state.     Severe AS s/p TAVR 07/12/2019      CAD s/p  CABG in 2003.   Cardiac cath  06/2019 showed patent LIMA to LAD, patent SVG to OM1, patent SVG to OM2, and patent SVG to RCA.        I have reviewed the above studies/reports.    MEDICATIONS/ALLERGY     Current Outpatient Medications   Medication Instructions    acetaminophen (TYLENOL) 650 mg, Oral, 3 times daily PRN    docusate sodium (COLACE) 100 mg, Oral, 2 times daily    empagliflozin (JARDIANCE) 10 mg, Oral, Daily    rosuvastatin (CRESTOR) 10 mg, Oral, At bedtime    sacubitril-valsartan (ENTRESTO) 24-26 MG Tab per tablet 0.5 tablets, Oral, 2 times daily    spironolactone (ALDACTONE) 12.5 mg, Oral, Daily    tamsulosin (FLOMAX) 0.4 mg, Oral, Every evening    Vitamin D (Cholecalciferol) 2,000 Units, Oral, Every Mon/Wed/Fri morning      Allergies[1]    PAST MEDICAL HISTORY   As noted above    SOCIAL HISTORY     Social History     Tobacco Use    Smoking status: Never    Smokeless tobacco: Never   Substance Use Topics    Alcohol use: Never       FAMILY HISTORY   family history includes No known problems in his brother, father, mother, and sister.    PHYSICAL EXAM   Blood pressure 109/45, pulse (!) 49, weight 71.7 kg (158 lb).  @VSRANGES @  BP Readings from Last 5 Encounters:   10/05/22 109/45   09/29/22 104/44   09/17/22 116/53   09/16/22 105/50   09/10/22 103/47     Wt Readings from Last 5 Encounters:   10/05/22 71.7 kg (158 lb)   09/29/22 73 kg (160 lb 15 oz)   09/17/22 71.6 kg (157 lb 12.8 oz)   09/16/22 71.7 kg (158 lb)   09/10/22 71.1 kg (156 lb 12.8 oz)      Limited physical exam due to video visit.    Body mass index is 24.02 kg/m.   General: no distress   Head: NC/AT  Neuro/Psych: alert and oriented    LAB RESULTS     Basic Metabolic Profile   Recent Labs     09/29/22  0341   Sodium 144   Potassium 5.2   Chloride 113*   CO2 26   BUN 47*   Creatinine 1.4   GFR 46.3*   Calcium 9.4   Glucose 95        Cardiac Biomarkers   Recent Labs     03/12/22  1413  04/27/21  1814   hs Troponin-I  --  17.6   NT-proBNP 933* 1,530*  CBC with Diff   Recent Labs     09/29/22  0341   WBC 4.77   Hemoglobin 11.9*   Hematocrit 36.0*   Platelet Count 95*        Cholesterol Panel   Recent Labs     01/01/21  1500 10/31/20  1114 12/30/18  0957   Cholesterol 172 147 222*   Triglycerides 67 47 85   HDL 73 62 76   LDL Calculated 86 76 129*         Endocrine   Recent Labs     08/26/22  0519 07/10/19  1422   Hemoglobin A1C  --  4.6   TSH 2.50  --          Coagulation Studies   Lab Results   Component Value Date    INR 1.2 (H) 07/10/2019    DDIMER 2.05 (H) 04/08/2021           This note was generated by the Epic EMR system/Dragon speech recognition and may contain inherent errors or omissions not intended by the user. Grammatical errors, random word insertions, deletions and pronoun errors  are occasional consequences of this technology due to software limitations. Not all errors are caught or corrected. If there are questions or concerns about the content of this note or information contained within the body of this dictation they should be addressed directly with the author for clarification.             [1] No Known Allergies

## 2022-10-09 ENCOUNTER — Encounter (INDEPENDENT_AMBULATORY_CARE_PROVIDER_SITE_OTHER): Payer: Self-pay | Admitting: Student in an Organized Health Care Education/Training Program

## 2022-11-04 ENCOUNTER — Encounter (INDEPENDENT_AMBULATORY_CARE_PROVIDER_SITE_OTHER): Payer: Self-pay

## 2022-12-18 ENCOUNTER — Emergency Department: Payer: Medicare Other

## 2022-12-18 ENCOUNTER — Inpatient Hospital Stay
Admission: EM | Admit: 2022-12-18 | Discharge: 2022-12-22 | DRG: 309 | Disposition: A | Discharge status: Hospice | Payer: Medicare Other | Attending: Internal Medicine | Admitting: Internal Medicine

## 2022-12-18 ENCOUNTER — Observation Stay: Payer: Medicare Other

## 2022-12-18 DIAGNOSIS — Z66 Do not resuscitate: Secondary | ICD-10-CM | POA: Diagnosis present

## 2022-12-18 DIAGNOSIS — Z952 Presence of prosthetic heart valve: Secondary | ICD-10-CM

## 2022-12-18 DIAGNOSIS — D472 Monoclonal gammopathy: Secondary | ICD-10-CM | POA: Diagnosis present

## 2022-12-18 DIAGNOSIS — E785 Hyperlipidemia, unspecified: Secondary | ICD-10-CM | POA: Diagnosis present

## 2022-12-18 DIAGNOSIS — S7291XA Unspecified fracture of right femur, initial encounter for closed fracture: Secondary | ICD-10-CM

## 2022-12-18 DIAGNOSIS — Z79899 Other long term (current) drug therapy: Secondary | ICD-10-CM

## 2022-12-18 DIAGNOSIS — H9190 Unspecified hearing loss, unspecified ear: Secondary | ICD-10-CM | POA: Diagnosis present

## 2022-12-18 DIAGNOSIS — R54 Age-related physical debility: Secondary | ICD-10-CM | POA: Diagnosis present

## 2022-12-18 DIAGNOSIS — Z7984 Long term (current) use of oral hypoglycemic drugs: Secondary | ICD-10-CM

## 2022-12-18 DIAGNOSIS — I130 Hypertensive heart and chronic kidney disease with heart failure and stage 1 through stage 4 chronic kidney disease, or unspecified chronic kidney disease: Secondary | ICD-10-CM | POA: Diagnosis present

## 2022-12-18 DIAGNOSIS — N400 Enlarged prostate without lower urinary tract symptoms: Secondary | ICD-10-CM | POA: Diagnosis present

## 2022-12-18 DIAGNOSIS — I472 Ventricular tachycardia, unspecified: Secondary | ICD-10-CM | POA: Diagnosis present

## 2022-12-18 DIAGNOSIS — Z951 Presence of aortocoronary bypass graft: Secondary | ICD-10-CM

## 2022-12-18 DIAGNOSIS — Z8673 Personal history of transient ischemic attack (TIA), and cerebral infarction without residual deficits: Secondary | ICD-10-CM

## 2022-12-18 DIAGNOSIS — D696 Thrombocytopenia, unspecified: Secondary | ICD-10-CM | POA: Diagnosis present

## 2022-12-18 DIAGNOSIS — G8929 Other chronic pain: Secondary | ICD-10-CM | POA: Diagnosis present

## 2022-12-18 DIAGNOSIS — F0150 Vascular dementia without behavioral disturbance: Secondary | ICD-10-CM | POA: Diagnosis present

## 2022-12-18 DIAGNOSIS — M545 Low back pain, unspecified: Secondary | ICD-10-CM | POA: Diagnosis present

## 2022-12-18 DIAGNOSIS — R001 Bradycardia, unspecified: Secondary | ICD-10-CM

## 2022-12-18 DIAGNOSIS — I493 Ventricular premature depolarization: Secondary | ICD-10-CM | POA: Diagnosis present

## 2022-12-18 DIAGNOSIS — G9340 Encephalopathy, unspecified: Secondary | ICD-10-CM | POA: Diagnosis present

## 2022-12-18 DIAGNOSIS — I5020 Unspecified systolic (congestive) heart failure: Secondary | ICD-10-CM

## 2022-12-18 DIAGNOSIS — D638 Anemia in other chronic diseases classified elsewhere: Secondary | ICD-10-CM | POA: Diagnosis present

## 2022-12-18 DIAGNOSIS — I2510 Atherosclerotic heart disease of native coronary artery without angina pectoris: Secondary | ICD-10-CM | POA: Diagnosis present

## 2022-12-18 DIAGNOSIS — R42 Dizziness and giddiness: Principal | ICD-10-CM | POA: Diagnosis present

## 2022-12-18 DIAGNOSIS — R1310 Dysphagia, unspecified: Secondary | ICD-10-CM | POA: Diagnosis present

## 2022-12-18 DIAGNOSIS — I429 Cardiomyopathy, unspecified: Secondary | ICD-10-CM | POA: Diagnosis present

## 2022-12-18 DIAGNOSIS — I4510 Unspecified right bundle-branch block: Secondary | ICD-10-CM | POA: Diagnosis present

## 2022-12-18 DIAGNOSIS — Z8659 Personal history of other mental and behavioral disorders: Secondary | ICD-10-CM

## 2022-12-18 DIAGNOSIS — Z515 Encounter for palliative care: Secondary | ICD-10-CM

## 2022-12-18 DIAGNOSIS — I441 Atrioventricular block, second degree: Principal | ICD-10-CM | POA: Diagnosis present

## 2022-12-18 DIAGNOSIS — N1831 Chronic kidney disease, stage 3a: Secondary | ICD-10-CM | POA: Diagnosis present

## 2022-12-18 DIAGNOSIS — I5042 Chronic combined systolic (congestive) and diastolic (congestive) heart failure: Secondary | ICD-10-CM | POA: Diagnosis present

## 2022-12-18 LAB — URINALYSIS WITH REFLEX TO MICROSCOPIC EXAM - REFLEX TO CULTURE
Urine Bilirubin: NEGATIVE
Urine Blood: NEGATIVE
Urine Ketones: NEGATIVE mg/dL
Urine Nitrite: NEGATIVE
Urine Specific Gravity: 1.016 (ref 1.001–1.035)
Urine Urobilinogen: NORMAL mg/dL (ref 0.2–2.0)
Urine pH: 7 (ref 5.0–8.0)

## 2022-12-18 LAB — LAB USE ONLY - CBC WITH DIFFERENTIAL
Absolute Basophils: 0.02 10*3/uL (ref 0.00–0.08)
Absolute Eosinophils: 0.08 10*3/uL (ref 0.00–0.44)
Absolute Immature Granulocytes: 0 10*3/uL (ref 0.00–0.07)
Absolute Lymphocytes: 1 10*3/uL (ref 0.42–3.22)
Absolute Monocytes: 0.35 10*3/uL (ref 0.21–0.85)
Absolute Neutrophils: 1.7 10*3/uL (ref 1.10–6.33)
Absolute nRBC: 0 10*3/uL (ref <=0.00)
Basophils %: 0.6 %
Eosinophils %: 2.5 %
Hematocrit: 34.9 % — ABNORMAL LOW (ref 37.6–49.6)
Hemoglobin: 11.4 g/dL — ABNORMAL LOW (ref 12.5–17.1)
Immature Granulocytes %: 0 %
Lymphocytes %: 31.7 %
MCH: 33.5 pg (ref 25.1–33.5)
MCHC: 32.7 g/dL (ref 31.5–35.8)
MCV: 102.6 fL — ABNORMAL HIGH (ref 78.0–96.0)
MPV: 10.5 fL (ref 8.9–12.5)
Monocytes %: 11.1 %
Neutrophils %: 54.1 %
Platelet Count: 124 10*3/uL — ABNORMAL LOW (ref 142–346)
Preliminary Absolute Neutrophil Count: 1.7 10*3/uL (ref 1.10–6.33)
RBC: 3.4 10*6/uL — ABNORMAL LOW (ref 4.20–5.90)
RDW: 13 % (ref 11–15)
WBC: 3.15 10*3/uL (ref 3.10–9.50)
nRBC %: 0 /100{WBCs} (ref <=0.0)

## 2022-12-18 LAB — COMPREHENSIVE METABOLIC PANEL
ALT: 10 U/L (ref 0–55)
AST (SGOT): 17 U/L (ref 5–41)
Albumin/Globulin Ratio: 0.8 — ABNORMAL LOW (ref 0.9–2.2)
Albumin: 3.3 g/dL — ABNORMAL LOW (ref 3.5–5.0)
Alkaline Phosphatase: 75 U/L (ref 37–117)
Anion Gap: 7 (ref 5.0–15.0)
BUN: 27 mg/dL (ref 9–28)
Bilirubin, Total: 0.6 mg/dL (ref 0.2–1.2)
CO2: 25 meq/L (ref 17–29)
Calcium: 9.7 mg/dL (ref 7.9–10.2)
Chloride: 109 meq/L (ref 99–111)
Creatinine: 1.4 mg/dL (ref 0.5–1.5)
GFR: 46 mL/min/{1.73_m2} — ABNORMAL LOW (ref >=60.0)
Globulin: 4.3 g/dL — ABNORMAL HIGH (ref 2.0–3.6)
Glucose: 111 mg/dL — ABNORMAL HIGH (ref 70–100)
Potassium: 4.6 meq/L (ref 3.5–5.3)
Protein, Total: 7.6 g/dL (ref 6.0–8.3)
Sodium: 141 meq/L (ref 135–145)

## 2022-12-18 LAB — LAB USE ONLY - URINE GRAY CULTURE HOLD TUBE

## 2022-12-18 LAB — URINE DRUGS OF ABUSE SCREEN
Urine Amphetamine Screen: NEGATIVE
Urine Barbituate Screen: NEGATIVE
Urine Benzodiazepine Screen: NEGATIVE
Urine Cannabinoid Screen: NEGATIVE
Urine Cocaine Screen: NEGATIVE
Urine Fentanyl Screen: NEGATIVE
Urine Opiate Screen: NEGATIVE
Urine PCP Screen: NEGATIVE

## 2022-12-18 LAB — PHOSPHORUS: Phosphorus: 2.8 mg/dL (ref 2.3–4.7)

## 2022-12-18 LAB — HIGH SENSITIVITY TROPONIN-I: hs Troponin: 27.8 ng/L (ref <=35.0)

## 2022-12-18 LAB — TSH: TSH: 2.84 u[IU]/mL (ref 0.35–4.94)

## 2022-12-18 LAB — LAB USE ONLY - LIGHT BLUE - CITRATE HOLD TUBE

## 2022-12-18 LAB — MAGNESIUM: Magnesium: 2.2 mg/dL (ref 1.6–2.6)

## 2022-12-18 MED ORDER — NALOXONE HCL 0.4 MG/ML IJ SOLN (WRAP)
0.2000 mg | INTRAMUSCULAR | Status: DC | PRN
Start: 2022-12-18 — End: 2022-12-21

## 2022-12-18 MED ORDER — POTASSIUM CHLORIDE 10 MEQ/100ML IV SOLN (WRAP)
10.0000 meq | INTRAVENOUS | Status: DC | PRN
Start: 2022-12-18 — End: 2022-12-21

## 2022-12-18 MED ORDER — POTASSIUM & SODIUM PHOSPHATES 280-160-250 MG PO PACK
2.0000 | PACK | ORAL | Status: DC | PRN
Start: 2022-12-18 — End: 2022-12-21

## 2022-12-18 MED ORDER — DEXTROSE 10 % IV BOLUS
12.5000 g | INTRAVENOUS | Status: DC | PRN
Start: 2022-12-18 — End: 2022-12-21

## 2022-12-18 MED ORDER — TAMSULOSIN HCL 0.4 MG PO CAPS
0.4000 mg | ORAL_CAPSULE | Freq: Every day | ORAL | Status: DC
Start: 2022-12-18 — End: 2022-12-22
  Administered 2022-12-18 – 2022-12-19 (×2): 0.4 mg via ORAL
  Filled 2022-12-18 (×3): qty 1

## 2022-12-18 MED ORDER — CARBOXYMETHYLCELLULOSE SODIUM 0.5 % OP SOLN
1.0000 [drp] | Freq: Three times a day (TID) | OPHTHALMIC | Status: DC | PRN
Start: 2022-12-18 — End: 2022-12-22

## 2022-12-18 MED ORDER — POTASSIUM CHLORIDE CRYS ER 20 MEQ PO TBCR
0.0000 meq | EXTENDED_RELEASE_TABLET | ORAL | Status: DC | PRN
Start: 2022-12-18 — End: 2022-12-21

## 2022-12-18 MED ORDER — MELATONIN 3 MG PO TABS
3.0000 mg | ORAL_TABLET | Freq: Every evening | ORAL | Status: DC | PRN
Start: 2022-12-18 — End: 2022-12-22

## 2022-12-18 MED ORDER — SACUBITRIL-VALSARTAN 24-26 MG PO TABS
0.5000 | ORAL_TABLET | Freq: Two times a day (BID) | ORAL | Status: DC
Start: 2022-12-18 — End: 2022-12-22
  Administered 2022-12-18 – 2022-12-22 (×6): 0.5 via ORAL
  Filled 2022-12-18 (×6): qty 1

## 2022-12-18 MED ORDER — POTASSIUM CHLORIDE 20 MEQ PO PACK
0.0000 meq | PACK | ORAL | Status: DC | PRN
Start: 2022-12-18 — End: 2022-12-21

## 2022-12-18 MED ORDER — SALINE SPRAY 0.65 % NA SOLN
2.0000 | NASAL | Status: DC | PRN
Start: 2022-12-18 — End: 2022-12-22

## 2022-12-18 MED ORDER — GLUCOSE 40 % PO GEL (WRAP)
15.0000 g | ORAL | Status: DC | PRN
Start: 2022-12-18 — End: 2022-12-21

## 2022-12-18 MED ORDER — ATROPINE SULFATE 0.1 MG/ML IJ SOLN (WRAP)
0.5000 mg | Freq: Once | INTRAMUSCULAR | Status: DC | PRN
Start: 2022-12-18 — End: 2022-12-22
  Filled 2022-12-18: qty 10

## 2022-12-18 MED ORDER — EMPAGLIFLOZIN 10 MG PO TABS
10.0000 mg | ORAL_TABLET | Freq: Every day | ORAL | Status: DC
Start: 2022-12-18 — End: 2022-12-22
  Administered 2022-12-18 – 2022-12-22 (×4): 10 mg via ORAL
  Filled 2022-12-18 (×4): qty 1

## 2022-12-18 MED ORDER — ONDANSETRON HCL 4 MG/2ML IJ SOLN
4.0000 mg | Freq: Four times a day (QID) | INTRAMUSCULAR | Status: DC | PRN
Start: 2022-12-18 — End: 2022-12-21

## 2022-12-18 MED ORDER — GLUCAGON 1 MG IJ SOLR (WRAP)
1.0000 mg | INTRAMUSCULAR | Status: DC | PRN
Start: 2022-12-18 — End: 2022-12-21

## 2022-12-18 MED ORDER — MAGNESIUM SULFATE IN D5W 1-5 GM/100ML-% IV SOLN
1.0000 g | INTRAVENOUS | Status: DC | PRN
Start: 2022-12-18 — End: 2022-12-21

## 2022-12-18 MED ORDER — LORAZEPAM 2 MG/ML IJ SOLN
0.5000 mg | Freq: Once | INTRAMUSCULAR | Status: DC | PRN
Start: 2022-12-18 — End: 2022-12-21

## 2022-12-18 MED ORDER — DEXTROSE 50 % IV SOLN
12.5000 g | INTRAVENOUS | Status: DC | PRN
Start: 2022-12-18 — End: 2022-12-21

## 2022-12-18 MED ORDER — ONDANSETRON 4 MG PO TBDP
4.0000 mg | ORAL_TABLET | Freq: Four times a day (QID) | ORAL | Status: DC | PRN
Start: 2022-12-18 — End: 2022-12-21

## 2022-12-18 MED ORDER — BENZONATATE 100 MG PO CAPS
100.0000 mg | ORAL_CAPSULE | Freq: Three times a day (TID) | ORAL | Status: DC | PRN
Start: 2022-12-18 — End: 2022-12-22

## 2022-12-18 MED ORDER — BENZOCAINE-MENTHOL MT LOZG (WRAP)
1.0000 | LOZENGE | OROMUCOSAL | Status: DC | PRN
Start: 2022-12-18 — End: 2022-12-22

## 2022-12-18 MED ORDER — ENOXAPARIN SODIUM 40 MG/0.4ML IJ SOSY
40.0000 mg | PREFILLED_SYRINGE | Freq: Every day | INTRAMUSCULAR | Status: DC
Start: 2022-12-18 — End: 2022-12-21
  Administered 2022-12-19 – 2022-12-21 (×3): 40 mg via SUBCUTANEOUS
  Filled 2022-12-18 (×3): qty 0.4

## 2022-12-18 MED ORDER — ACETAMINOPHEN 325 MG PO TABS
650.0000 mg | ORAL_TABLET | Freq: Three times a day (TID) | ORAL | Status: DC | PRN
Start: 2022-12-18 — End: 2022-12-21

## 2022-12-18 NOTE — EDIE (Signed)
 [facility] NOTIFICATION 12/18/2022 09:35 [name], Rick L DOB: 10-10-1924 MRN: [id number]    Criteria Met      5 + ED Visits in 12 Months    Security and Safety  No Security Events were found.  ED Care Guidelines  There are currently no ED Care Guideli

## 2022-12-18 NOTE — ED Provider Notes (Signed)
 EMERGENCY DEPARTMENT HISTORY AND PHYSICAL EXAM     None        Date: 12/18/2022  Patient Name: Rick [name] [facility]    History of Presenting Illness     Chief Complaint   Patient presents with    Dizziness    Altered Mental Status       History Provided

## 2022-12-18 NOTE — Consults (Signed)
 Telestroke Acute Stroke Alert Note (Telephone-only Assessment)       We were consulted for stroke evaluation by Lawerance Sabal, MD who is caring for the patient at [facility] and case was discussed briefly.     CC:      HPI:  Rick [name] is a 89 y.o

## 2022-12-18 NOTE — ED Notes (Signed)
Pt given admission instructions with understanding verbalized. Pt is A &O x4. Resp even and unlabored. Skin warm and dry. No acute distress noted. Pt transported via ambulance to [facility].

## 2022-12-18 NOTE — Plan of Care (Signed)
 NURSING SHIFT NOTE     Patient: Rick [name]  Day: 0  SHIFT EVENTS   [facility] room 2513 bed B for Dizziness. Patient is alert and disoriented to place,time and situation. Person of contact ([place] II) co

## 2022-12-18 NOTE — ED Triage Notes (Signed)
Patient c/o dizziness/vertigo, balance issues and disorientation for past week per son. States that dizziness happens sitting and w/ ambulation. No hx stroke.

## 2022-12-18 NOTE — H&P (Signed)
 [facility]      Patient: Rick [name]  Date: 12/18/2022   DOB: December 20, 1924  Date of Admission: 12/18/2022   MRN: [id number]  Attending: Lawerance Sabal, MD         Chief Complaint   Patient presents with    Dizziness    Altered Mental Status

## 2022-12-18 NOTE — ED to IP RN Note (Signed)
 [facility]  ED NURSING NOTE FOR THE RECEIVING INPATIENT NURSE   ED NURSE [place] [id number]   ED CHARGE RN Morrell Riddle, RN   ADMISSION INFORMATION   Rick [name] is a 66

## 2022-12-19 LAB — HEPATIC FUNCTION PANEL (LFT)
ALT: 11 U/L (ref 0–55)
AST (SGOT): 20 U/L (ref 5–41)
Albumin/Globulin Ratio: 0.8 — ABNORMAL LOW (ref 0.9–2.2)
Albumin: 3.2 g/dL — ABNORMAL LOW (ref 3.5–5.0)
Alkaline Phosphatase: 67 U/L (ref 37–117)
Bilirubin Direct: 0.3 mg/dL (ref 0.0–0.5)
Bilirubin Indirect: 0.4 mg/dL (ref 0.2–1.0)
Bilirubin, Total: 0.7 mg/dL (ref 0.2–1.2)
Globulin: 3.8 g/dL — ABNORMAL HIGH (ref 2.0–3.6)
Protein, Total: 7 g/dL (ref 6.0–8.3)

## 2022-12-19 LAB — LAB USE ONLY - CBC WITH DIFFERENTIAL
Absolute Basophils: 0.01 10*3/uL (ref 0.00–0.08)
Absolute Eosinophils: 0.09 10*3/uL (ref 0.00–0.44)
Absolute Immature Granulocytes: 0.01 10*3/uL (ref 0.00–0.07)
Absolute Lymphocytes: 1.05 10*3/uL (ref 0.42–3.22)
Absolute Monocytes: 0.4 10*3/uL (ref 0.21–0.85)
Absolute Neutrophils: 1.55 10*3/uL (ref 1.10–6.33)
Absolute nRBC: 0 10*3/uL (ref <=0.00)
Basophils %: 0.3 %
Eosinophils %: 2.9 %
Hematocrit: 35.2 % — ABNORMAL LOW (ref 37.6–49.6)
Hemoglobin: 11.8 g/dL — ABNORMAL LOW (ref 12.5–17.1)
Immature Granulocytes %: 0.3 %
Lymphocytes %: 33.8 %
MCH: 34.9 pg — ABNORMAL HIGH (ref 25.1–33.5)
MCHC: 33.5 g/dL (ref 31.5–35.8)
MCV: 104.1 fL — ABNORMAL HIGH (ref 78.0–96.0)
MPV: 10.8 fL (ref 8.9–12.5)
Monocytes %: 12.9 %
Neutrophils %: 49.8 %
Platelet Count: 123 10*3/uL — ABNORMAL LOW (ref 142–346)
Preliminary Absolute Neutrophil Count: 1.55 10*3/uL (ref 1.10–6.33)
RBC: 3.38 10*6/uL — ABNORMAL LOW (ref 4.20–5.90)
RDW: 13 % (ref 11–15)
WBC: 3.11 10*3/uL (ref 3.10–9.50)
nRBC %: 0 /100{WBCs} (ref <=0.0)

## 2022-12-19 LAB — ECG 12-LEAD
Atrial Rate: 55 {beats}/min
P Axis: 91 degrees
P-R Interval: 254 ms
Q-T Interval: 506 ms
QRS Duration: 162 ms
QTC Calculation (Bezet): 484 ms
R Axis: -42 degrees
T Axis: 33 degrees
Ventricular Rate: 55 {beats}/min

## 2022-12-19 LAB — MAGNESIUM: Magnesium: 2.2 mg/dL (ref 1.6–2.6)

## 2022-12-19 LAB — BASIC METABOLIC PANEL
Anion Gap: 7 (ref 5.0–15.0)
BUN: 21 mg/dL (ref 9–28)
CO2: 22 meq/L (ref 17–29)
Calcium: 9.4 mg/dL (ref 7.9–10.2)
Chloride: 109 meq/L (ref 99–111)
Creatinine: 1.2 mg/dL (ref 0.5–1.5)
GFR: 55.3 mL/min/{1.73_m2} — ABNORMAL LOW (ref >=60.0)
Glucose: 86 mg/dL (ref 70–100)
Potassium: 4.7 meq/L (ref 3.5–5.3)
Sodium: 138 meq/L (ref 135–145)

## 2022-12-19 LAB — PHOSPHORUS: Phosphorus: 3.1 mg/dL (ref 2.3–4.7)

## 2022-12-19 NOTE — SLP Eval Note (Signed)
 [facility]  Speech and Language Therapy Bedside Swallow Evaluation     Patient:  Rick [name] MRN#:  [id number]  Unit:  [facility] Room/Bed:  A2513/A2513-B    Time of Treatment:   Time Calculation  SLP Received On:

## 2022-12-19 NOTE — UM Notes (Signed)
 PATIENT NAME: Rick [name], Rick [name]   DOB: [birth year]     Admission Order 11/01 & 11/02   Adult Admit to Observation (Order #[id number]) on 12/18/22     Admit Diagnosis: Dizziness [R42]  History of dementia [Z86.59]  Facility: [facility]

## 2022-12-19 NOTE — Consults (Addendum)
 CARDIOLOGY HOSPITAL NOTE  Gilford Raid, NP   [facility] Cardiology  Office phone:  [phone]     [facility] phone [phone]  [facility] phone [phone]    Date:  12/19/2022  9:44 AM    Patient:  Rick [name]

## 2022-12-19 NOTE — Progress Notes (Signed)
 [facility]  PROGRESS NOTE      Patient: Rick [name]  Date: 12/19/2022   LOS: 0 Days  Admission Date: 12/18/2022   MRN: [id number]  Attending: Dartha Lodge, MD  When on service as the attending, please contact me on Epic Secure Chat fr

## 2022-12-19 NOTE — Plan of Care (Signed)
 Problem: Moderate/High Fall Risk Score >5  Goal: Patient will remain free of falls  12/19/2022 1006 by Scherrie November, RN  Outcome: Progressing  Flowsheets (Taken 12/19/2022 0000 by Tamsen Roers, RN)  High (Greater than 13):   HIGH-Consider use of

## 2022-12-19 NOTE — Plan of Care (Signed)
 NURSING SHIFT NOTE     Patient: Rick [name]  Day: 0      SHIFT EVENTS     Shift Narrative/Significant Events (PRN med administration, fall, RRT, etc.):     Safety and fall precautions remain in place. Purposeful rounding completed.          AS

## 2022-12-20 DIAGNOSIS — R42 Dizziness and giddiness: Principal | ICD-10-CM

## 2022-12-20 NOTE — Nursing Progress Note (Deleted)
 4 eyes in 4 hours pressure injury assessment note:      Completed with: Fre and Darryl  Unit admitted:  25            Bony Prominences: Check appropriate box; if wound is present enter wound assessment in LDA     Occiput:                 [x] WNL  []  Wound p

## 2022-12-20 NOTE — Plan of Care (Signed)
 NURSING SHIFT NOTE     Patient: Rick [name]  Day: 0      SHIFT EVENTS     Shift Narrative/Significant Events (PRN med administration, fall, RRT, etc.): Patient is alert and oriented x to self.  No indication of pain noted. Patient brady on tel

## 2022-12-20 NOTE — Nursing Progress Note (Signed)
 NURSING SHIFT NOTE     Patient: Rick [name]  Day: 0      SHIFT EVENTS     Shift Narrative/Significant Events (PRN med administration, fall, RRT, etc.):     Alert and oriented x 4 and the patient calm since the beginning of day shift. Patient s

## 2022-12-20 NOTE — Progress Notes (Signed)
 [facility]  PROGRESS NOTE      Patient: Rick [name]  Date: 12/20/2022   LOS: 0 Days  Admission Date: 12/18/2022   MRN: [id number]  Attending: Dartha Lodge, MD  When on service as the attending, please contact me on Epic Secure Chat fr

## 2022-12-20 NOTE — Progress Notes (Signed)
 Neurology POC       We were consulted for stroke evaluation by Dartha Lodge, MD who is caring for the patient at [facility] and case was discussed briefly.     CC:  dizziness    HPI:  Rick [name] is a 89 y.o. male w/PMH of dementia who presen

## 2022-12-20 NOTE — Plan of Care (Signed)
 Problem: Moderate/High Fall Risk Score >5  Goal: Patient will remain free of falls  Outcome: Progressing  Flowsheets  Taken 12/20/2022 1008 by Meredith Staggers, RN  VH High Risk (Greater than 13):   ALL REQUIRED LOW INTERVENTIONS   ALL REQUIRED MODERATE INTER

## 2022-12-20 NOTE — SLP Eval Note (Signed)
 [facility]  Speech Therapy Treatment Note    Patient:  Rick [name] MRN#:  [id number]  Unit:  [facility] Room/Bed:  F6433/I9518-A      Time of treatment:   SLP Received On: 12/20/22  Start Time: 0930  Stop Time: 095

## 2022-12-20 NOTE — UM Notes (Signed)
 PATIENT NAME: Rick [name], Rick [name]   DOB: 04-May-1924     Continued Stay Review: 11/03   Adult Admit to Observation (Order #[id number]) on 12/18/22     Upgraded to Inpatient on 11/03  Admit to Inpatient (Order #[id number]) on 12/20/22     Admit Diagnosis: Judi Cong

## 2022-12-21 DIAGNOSIS — Z515 Encounter for palliative care: Secondary | ICD-10-CM

## 2022-12-21 DIAGNOSIS — F0390 Unspecified dementia without behavioral disturbance: Secondary | ICD-10-CM

## 2022-12-21 DIAGNOSIS — Z7189 Other specified counseling: Secondary | ICD-10-CM

## 2022-12-21 DIAGNOSIS — R1310 Dysphagia, unspecified: Secondary | ICD-10-CM

## 2022-12-21 MED ORDER — ONDANSETRON HCL 4 MG/2ML IJ SOLN
8.0000 mg | Freq: Three times a day (TID) | INTRAMUSCULAR | Status: DC | PRN
Start: 2022-12-21 — End: 2022-12-22

## 2022-12-21 MED ORDER — BISACODYL 10 MG RE SUPP
10.0000 mg | Freq: Every day | RECTAL | Status: DC | PRN
Start: 2022-12-21 — End: 2022-12-22
  Administered 2022-12-22: 10 mg via RECTAL
  Filled 2022-12-21: qty 1

## 2022-12-21 MED ORDER — ACETAMINOPHEN 325 MG PO TABS
650.0000 mg | ORAL_TABLET | ORAL | Status: DC | PRN
Start: 2022-12-21 — End: 2022-12-22

## 2022-12-21 MED ORDER — ACETAMINOPHEN 650 MG RE SUPP
650.0000 mg | RECTAL | Status: DC | PRN
Start: 2022-12-21 — End: 2022-12-22

## 2022-12-21 MED ORDER — LORAZEPAM 2 MG/ML IJ SOLN
0.5000 mg | INTRAMUSCULAR | Status: DC | PRN
Start: 2022-12-21 — End: 2022-12-22

## 2022-12-21 MED ORDER — GLYCOPYRROLATE 0.2 MG/ML IJ SOLN (WRAP)
0.2000 mg | INTRAMUSCULAR | Status: DC | PRN
Start: 2022-12-21 — End: 2022-12-22
  Filled 2022-12-21: qty 1

## 2022-12-21 MED ORDER — GLYCOPYRROLATE 0.2 MG/ML IJ SOLN (WRAP)
0.2000 mg | Freq: Four times a day (QID) | INTRAMUSCULAR | Status: DC
Start: 2022-12-21 — End: 2022-12-22
  Administered 2022-12-21 – 2022-12-22 (×4): 0.2 mg via INTRAVENOUS
  Filled 2022-12-21 (×3): qty 1

## 2022-12-21 MED ORDER — ONDANSETRON 4 MG PO TBDP
8.0000 mg | ORAL_TABLET | Freq: Three times a day (TID) | ORAL | Status: DC | PRN
Start: 2022-12-21 — End: 2022-12-22

## 2022-12-21 MED ORDER — OXYCODONE HCL 5 MG PO TABS
2.5000 mg | ORAL_TABLET | ORAL | Status: DC | PRN
Start: 2022-12-21 — End: 2022-12-22

## 2022-12-21 NOTE — Progress Notes (Signed)
 Palliative Medicine & Comprehensive Care  Service Phone Number: AX: [phone], Mon-Fri 9a-4p    Clinical Therapist - Initial Assessment     Date of Admission: 12/18/2022  Room/Bed: A2513/A2513-B    Situation: Palliative Clinical Therapist visited pat

## 2022-12-21 NOTE — Plan of Care (Signed)
 Problem: Pain  Goal: Pain at adequate level as identified by patient  Outcome: Progressing  Flowsheets (Taken 12/20/2022 2044 by Annamarie Major, RN)  Pain at adequate level as identified by patient:   Identify patient comfort function goal   [place]

## 2022-12-21 NOTE — Consults (Addendum)
 [facility] Palliative Medicine & Comprehensive Care Consult   Service Spectralink: [phone]  Mon-Fri 9a-4p        Palliative Care Consult   Date Time: 12/21/22 12:27 PM   Patient Name: Rick [name], Rick [name]   Location: X5284/X3244-W   Attending Physician

## 2022-12-21 NOTE — Progress Notes (Signed)
 12/21/22 1800   Volunteer Chaplain   Visit Type Initial   Source Chaplain Initiated   Present at Visit Patient;Family members   Spiritual Care Provided to patient;family members   Reason for Visit Spiritual Support   Spiritual Care Interventions Provide

## 2022-12-21 NOTE — Consults (Signed)
 Update as follows:    CM facilitated a conversation with the Pt's son about Hospice. The Pt's son shared he is interested in [facility]. CM created a hospice referral for [facility]. CM will continue to follow as needed.    Berman Grainger [employer]

## 2022-12-21 NOTE — Plan of Care (Signed)
 Patient is alert and stable with intermittent forgetfulness and restlessness,RVM on standby for monitoring.  scheduled meds given as ordered and pt tolerated with no issue. Patient is on room air, no respiratory distress noted.Patient  voided  using ext ca

## 2022-12-21 NOTE — Progress Notes (Signed)
 [facility]  PROGRESS NOTE      Patient: Rick [name]  Date: 12/21/2022   LOS: 1 Days  Admission Date: 12/18/2022   MRN: [id number]  Attending: Dartha Lodge, MD  When on service as the attending, please contact me on Epic Secure Chat fr

## 2022-12-21 NOTE — Progress Notes (Signed)
 Initial Case Management Assessment and Discharge Planning [facility]   Patient Name: Rick [name], Rick [name]   Date of Birth 08/30/1924   Attending Physician: Dartha Lodge, MD   Primary Care Physician: Erasmo Score, MD   Length of Stay 1   Re

## 2022-12-22 DIAGNOSIS — I5022 Chronic systolic (congestive) heart failure: Secondary | ICD-10-CM

## 2022-12-22 DIAGNOSIS — I459 Conduction disorder, unspecified: Secondary | ICD-10-CM

## 2022-12-22 MED ORDER — BISACODYL 10 MG RE SUPP
10.0000 mg | Freq: Every day | RECTAL | 0 refills | Status: DC | PRN
Start: 2022-12-22 — End: 2023-01-04

## 2022-12-22 MED ORDER — GLYCOPYRROLATE 1 MG PO TABS
1.0000 mg | ORAL_TABLET | ORAL | 0 refills | Status: AC | PRN
Start: 2022-12-22 — End: ?

## 2022-12-22 MED ORDER — ATROPINE SULFATE 1 % OP SOLN
1.0000 [drp] | Freq: Four times a day (QID) | OPHTHALMIC | 0 refills | Status: AC | PRN
Start: 2022-12-22 — End: ?

## 2022-12-22 MED ORDER — LORAZEPAM 2 MG/ML PO CONC
0.5000 mg | ORAL | 0 refills | Status: DC | PRN
Start: 2022-12-22 — End: 2022-12-25

## 2022-12-22 NOTE — Plan of Care (Signed)
 Discharge order verified, pt ID verified    Pt cleared for discharge. RN provided discharge instructions along with information regarding AVS, medication regimen, and f/u appointments to son [place]. Verbalized understanding of the preceding information an

## 2022-12-22 NOTE — Discharge Summary (Signed)
 [facility]      Patient: Rick [name] [facility]  Admission Date: 12/18/2022   DOB: [birth year]  Discharge Date: 12/22/2022    MRN: [id number]  Discharge Attending:Jabari Swoveland Cristy Friedlander, MD     Referring Physician: Erasmo Score, MD  PCP: Erasmo Score, MD

## 2022-12-22 NOTE — Nursing Progress Note (Signed)
 4 eyes in 4 hours pressure injury assessment note:      Completed with:   Unit & Time admitted:              Bony Prominences: Check appropriate box; if wound is present enter wound assessment in LDA     Occiput:                 [x] WNL  []  Wound present  F

## 2022-12-22 NOTE — Progress Notes (Signed)
 Palliative Medicine & Comprehensive Care  Service Phone Number: AX: [phone], Mon-Fri 9a-4p    Clinical Therapist - Progress Note     Situation: Palliative Clinical Therapist visited patient at bedside alongside Palliative PA to provide ongoing sup

## 2022-12-22 NOTE — Plan of Care (Signed)
 NURSING SHIFT NOTE     Patient: Rick [name]  Day: 2      SHIFT EVENTS     Shift Narrative/Significant Events (PRN med administration, fall, RRT, etc.):     Pt Alert to self and stable.  VSS. RA.  No sign of pain voiced.  Pt turned and repositi

## 2022-12-22 NOTE — Progress Notes (Signed)
 [facility] Palliative Medicine & Comprehensive Care  Service Phone Number: Mackie Pai: [phone]  Mon-Fri 9a-4p     Palliative Care Follow Up   Date Time: 12/22/22 11:01 AM   Patient Name: [name], Rick [name]   Location: Z3664/Q0347-Q   Attending Physic

## 2022-12-22 NOTE — Plan of Care (Signed)
 NURSING SHIFT NOTE     Patient: Rick [name]  Day: 2      SHIFT EVENTS     Shift Narrative/Significant Events (PRN med administration, fall, RRT, etc.):     Patient is alert and oriented to self. Patient expressed no signs of pain, SOB or     d

## 2022-12-22 NOTE — Discharge Instr - AVS First Page (Addendum)
 [facility] DISCHARGE INSTRUCTIONS     Date of Admission: 12/18/2022    Date of Discharge: 12/22/2022    Discharge Physician: Dartha Lodge, MD    Thank you for choosing the [facility]  for your healthcare needs. It was a privilege caring for

## 2022-12-25 MED ORDER — LORAZEPAM 2 MG/ML PO CONC
0.5000 mg | ORAL | 0 refills | Status: DC | PRN
Start: 2022-12-25 — End: 2023-01-07

## 2023-01-04 ENCOUNTER — Encounter (INDEPENDENT_AMBULATORY_CARE_PROVIDER_SITE_OTHER): Payer: Self-pay | Admitting: Student in an Organized Health Care Education/Training Program

## 2023-01-04 ENCOUNTER — Ambulatory Visit (INDEPENDENT_AMBULATORY_CARE_PROVIDER_SITE_OTHER): Payer: Medicare Other | Admitting: Student in an Organized Health Care Education/Training Program

## 2023-01-04 VITALS — BP 129/57 | HR 50 | Temp 96.4°F | Wt 156.0 lb

## 2023-01-04 DIAGNOSIS — F0390 Unspecified dementia without behavioral disturbance: Secondary | ICD-10-CM

## 2023-01-04 DIAGNOSIS — I252 Old myocardial infarction: Secondary | ICD-10-CM

## 2023-01-04 DIAGNOSIS — Z23 Encounter for immunization: Secondary | ICD-10-CM

## 2023-01-04 DIAGNOSIS — I5022 Chronic systolic (congestive) heart failure: Secondary | ICD-10-CM

## 2023-01-04 DIAGNOSIS — I2510 Atherosclerotic heart disease of native coronary artery without angina pectoris: Secondary | ICD-10-CM

## 2023-01-04 DIAGNOSIS — R001 Bradycardia, unspecified: Secondary | ICD-10-CM

## 2023-01-04 DIAGNOSIS — I441 Atrioventricular block, second degree: Secondary | ICD-10-CM

## 2023-01-04 DIAGNOSIS — S7291XA Unspecified fracture of right femur, initial encounter for closed fracture: Secondary | ICD-10-CM

## 2023-01-04 NOTE — Assessment & Plan Note (Signed)
-   no AMS since hospital discharge, Home hospice, monitoring

## 2023-01-04 NOTE — Progress Notes (Signed)
 Have you seen any specialists/other providers since your last visit with [place]?    Yes    Arm preference verified?   Yes, no preference    Health Maintenance Due   Topic Date Due    Medicare Annual Wellness Visit  Never done    Shingrix Vaccine 50+ (1) Never

## 2023-01-04 NOTE — Assessment & Plan Note (Signed)
-  Pain controlled, home hospice, [facility] PT/OT

## 2023-01-04 NOTE — Assessment & Plan Note (Signed)
-   Appears euvolemic, hold furosemide, daily weights, continue Entresto half a tablet, spironolactone/Jardiance on hold, has follow-up with cardiology

## 2023-01-04 NOTE — Assessment & Plan Note (Signed)
With AV block Mobitz type I, not a candidate for pacemaker, cardiology following, home hospice

## 2023-01-04 NOTE — Progress Notes (Signed)
 Subjective:      Patient ID: Rick [name] is a 89 y.o. male.    Chief Complaint:  Chief Complaint   Patient presents with    Hospital Follow-up     11/05-11/05-Dizziness             HPI   Here for follow-up from recent hospitalization.   Inp

## 2023-01-05 ENCOUNTER — Telehealth (INDEPENDENT_AMBULATORY_CARE_PROVIDER_SITE_OTHER): Payer: Self-pay | Admitting: Student in an Organized Health Care Education/Training Program

## 2023-01-05 NOTE — Telephone Encounter (Signed)
[facility] called asking for a copy of the office notes from 11/18 appointment.     Fax number [phone]

## 2023-01-05 NOTE — Telephone Encounter (Signed)
Notes has been faxed to [facility] care.

## 2023-01-07 ENCOUNTER — Ambulatory Visit (INDEPENDENT_AMBULATORY_CARE_PROVIDER_SITE_OTHER): Payer: Medicare Other | Admitting: Cardiovascular Disease

## 2023-01-07 ENCOUNTER — Encounter (INDEPENDENT_AMBULATORY_CARE_PROVIDER_SITE_OTHER): Payer: Self-pay | Admitting: Cardiovascular Disease

## 2023-01-07 VITALS — BP 124/55 | HR 66 | Ht 68.0 in | Wt 258.0 lb

## 2023-01-07 DIAGNOSIS — I5020 Unspecified systolic (congestive) heart failure: Secondary | ICD-10-CM

## 2023-01-07 NOTE — Progress Notes (Signed)
 [place] Electrophysiology    Chief Complaint   Patient presents with    HFrEF (heart failure with reduced ejection fraction)       Assessment and Plan   Home hospice now following most recent admission.      Saw Dr. Melina Schools in hospital --   89 yo M with a h

## 2023-01-08 ENCOUNTER — Encounter (INDEPENDENT_AMBULATORY_CARE_PROVIDER_SITE_OTHER): Payer: Self-pay | Admitting: Student in an Organized Health Care Education/Training Program

## 2023-01-11 ENCOUNTER — Other Ambulatory Visit (INDEPENDENT_AMBULATORY_CARE_PROVIDER_SITE_OTHER): Payer: Medicare Other

## 2023-01-14 LAB — ECG 12-LEAD
Q-T Interval: 540 ms
QRS Duration: 158 ms
QTC Calculation (Bezet): 423 ms
R Axis: -41 degrees
T Axis: -12 degrees
Ventricular Rate: 37 {beats}/min

## 2023-01-18 ENCOUNTER — Ambulatory Visit: Payer: Medicare Other | Admitting: Hematology & Oncology

## 2023-01-29 ENCOUNTER — Encounter (INDEPENDENT_AMBULATORY_CARE_PROVIDER_SITE_OTHER): Payer: Self-pay | Admitting: Student in an Organized Health Care Education/Training Program

## 2023-02-17 DEATH — deceased

## 2023-06-08 ENCOUNTER — Telehealth (INDEPENDENT_AMBULATORY_CARE_PROVIDER_SITE_OTHER): Payer: Self-pay | Admitting: Student in an Organized Health Care Education/Training Program

## 2023-06-08 NOTE — Telephone Encounter (Signed)
 Copied from CRM [id number]. Topic: Non-Appointment Question - Document Request  >> Jun 08, 2023  4:46 PM Kaaren Ora T wrote:  Beverli Bucker called about Non-Appointment Question - Document Request.  Additional details:  [facility] calling to check status of Physician order sent over on 06/03/23     Callback#[phone]

## 2023-11-10 ENCOUNTER — Other Ambulatory Visit (INDEPENDENT_AMBULATORY_CARE_PROVIDER_SITE_OTHER): Payer: Self-pay | Admitting: Student in an Organized Health Care Education/Training Program
